# Patient Record
Sex: Female | Born: 1955 | Race: Black or African American | Hispanic: No | Marital: Married | Smoking: Former smoker
Health system: Southern US, Community
[De-identification: ages and names within clinical notes are randomized; demographics above are authoritative.]

## PROBLEM LIST (undated history)

## (undated) DIAGNOSIS — J45909 Unspecified asthma, uncomplicated: Secondary | ICD-10-CM

## (undated) DIAGNOSIS — G8929 Other chronic pain: Secondary | ICD-10-CM

## (undated) DIAGNOSIS — Z8489 Family history of other specified conditions: Secondary | ICD-10-CM

## (undated) DIAGNOSIS — D561 Beta thalassemia: Secondary | ICD-10-CM

## (undated) DIAGNOSIS — R569 Unspecified convulsions: Secondary | ICD-10-CM

## (undated) DIAGNOSIS — Z9289 Personal history of other medical treatment: Secondary | ICD-10-CM

## (undated) DIAGNOSIS — Q453 Other congenital malformations of pancreas and pancreatic duct: Secondary | ICD-10-CM

## (undated) DIAGNOSIS — D759 Disease of blood and blood-forming organs, unspecified: Secondary | ICD-10-CM

## (undated) DIAGNOSIS — G43909 Migraine, unspecified, not intractable, without status migrainosus: Secondary | ICD-10-CM

## (undated) DIAGNOSIS — J189 Pneumonia, unspecified organism: Secondary | ICD-10-CM

## (undated) DIAGNOSIS — R109 Unspecified abdominal pain: Secondary | ICD-10-CM

## (undated) DIAGNOSIS — I10 Essential (primary) hypertension: Secondary | ICD-10-CM

## (undated) DIAGNOSIS — D569 Thalassemia, unspecified: Secondary | ICD-10-CM

## (undated) DIAGNOSIS — E119 Type 2 diabetes mellitus without complications: Secondary | ICD-10-CM

## (undated) DIAGNOSIS — I2699 Other pulmonary embolism without acute cor pulmonale: Secondary | ICD-10-CM

## (undated) DIAGNOSIS — D573 Sickle-cell trait: Secondary | ICD-10-CM

## (undated) DIAGNOSIS — I639 Cerebral infarction, unspecified: Secondary | ICD-10-CM

## (undated) DIAGNOSIS — E785 Hyperlipidemia, unspecified: Secondary | ICD-10-CM

## (undated) DIAGNOSIS — C55 Malignant neoplasm of uterus, part unspecified: Secondary | ICD-10-CM

## (undated) DIAGNOSIS — R002 Palpitations: Secondary | ICD-10-CM

## (undated) HISTORY — DX: Beta thalassemia: D56.1

## (undated) HISTORY — PX: BREAST LUMPECTOMY: SHX2

## (undated) HISTORY — DX: Thalassemia, unspecified: D56.9

## (undated) HISTORY — DX: Unspecified abdominal pain: R10.9

## (undated) HISTORY — DX: Hyperlipidemia, unspecified: E78.5

## (undated) HISTORY — DX: Palpitations: R00.2

## (undated) HISTORY — DX: Other congenital malformations of pancreas and pancreatic duct: Q45.3

## (undated) HISTORY — DX: Migraine, unspecified, not intractable, without status migrainosus: G43.909

## (undated) HISTORY — DX: Essential (primary) hypertension: I10

## (undated) HISTORY — DX: Sickle-cell trait: D57.3

## (undated) HISTORY — DX: Other chronic pain: G89.29

## (undated) HISTORY — DX: Other pulmonary embolism without acute cor pulmonale: I26.99

## (undated) HISTORY — PX: APPENDECTOMY: SHX54

## (undated) HISTORY — PX: SPHINCTEROTOMY: SHX5279

## (undated) HISTORY — PX: OTHER SURGICAL HISTORY: SHX169

---

## 1978-05-02 HISTORY — PX: ABDOMINAL HYSTERECTOMY: SHX81

## 1991-05-03 DIAGNOSIS — I639 Cerebral infarction, unspecified: Secondary | ICD-10-CM

## 1991-05-03 DIAGNOSIS — R569 Unspecified convulsions: Secondary | ICD-10-CM

## 1991-05-03 HISTORY — DX: Cerebral infarction, unspecified: I63.9

## 1991-05-03 HISTORY — DX: Unspecified convulsions: R56.9

## 1992-02-07 DIAGNOSIS — E119 Type 2 diabetes mellitus without complications: Secondary | ICD-10-CM

## 1992-02-07 DIAGNOSIS — E1165 Type 2 diabetes mellitus with hyperglycemia: Secondary | ICD-10-CM | POA: Insufficient documentation

## 1997-07-21 ENCOUNTER — Ambulatory Visit (HOSPITAL_COMMUNITY): Admission: RE | Admit: 1997-07-21 | Discharge: 1997-07-21 | Payer: Self-pay | Admitting: *Deleted

## 1997-07-31 ENCOUNTER — Encounter: Admission: RE | Admit: 1997-07-31 | Discharge: 1997-10-29 | Payer: Self-pay | Admitting: Orthopedic Surgery

## 1997-08-30 ENCOUNTER — Emergency Department (HOSPITAL_COMMUNITY): Admission: EM | Admit: 1997-08-30 | Discharge: 1997-08-30 | Payer: Self-pay | Admitting: Emergency Medicine

## 1997-09-06 ENCOUNTER — Emergency Department (HOSPITAL_COMMUNITY): Admission: EM | Admit: 1997-09-06 | Discharge: 1997-09-06 | Payer: Self-pay | Admitting: Emergency Medicine

## 1997-09-19 ENCOUNTER — Encounter: Admission: RE | Admit: 1997-09-19 | Discharge: 1997-09-19 | Payer: Self-pay | Admitting: Internal Medicine

## 1997-11-25 ENCOUNTER — Inpatient Hospital Stay (HOSPITAL_COMMUNITY): Admission: EM | Admit: 1997-11-25 | Discharge: 1997-11-27 | Payer: Self-pay | Admitting: Emergency Medicine

## 1997-12-12 ENCOUNTER — Encounter: Admission: RE | Admit: 1997-12-12 | Discharge: 1997-12-12 | Payer: Self-pay | Admitting: Internal Medicine

## 1998-02-24 ENCOUNTER — Emergency Department (HOSPITAL_COMMUNITY): Admission: EM | Admit: 1998-02-24 | Discharge: 1998-02-24 | Payer: Self-pay | Admitting: Emergency Medicine

## 1998-05-02 ENCOUNTER — Emergency Department (HOSPITAL_COMMUNITY): Admission: EM | Admit: 1998-05-02 | Discharge: 1998-05-02 | Payer: Self-pay | Admitting: Emergency Medicine

## 1998-05-31 ENCOUNTER — Encounter: Payer: Self-pay | Admitting: Emergency Medicine

## 1998-05-31 ENCOUNTER — Emergency Department (HOSPITAL_COMMUNITY): Admission: EM | Admit: 1998-05-31 | Discharge: 1998-05-31 | Payer: Self-pay | Admitting: Emergency Medicine

## 1998-06-16 ENCOUNTER — Encounter: Admission: RE | Admit: 1998-06-16 | Discharge: 1998-06-16 | Payer: Self-pay | Admitting: Obstetrics & Gynecology

## 1998-07-28 ENCOUNTER — Emergency Department (HOSPITAL_COMMUNITY): Admission: EM | Admit: 1998-07-28 | Discharge: 1998-07-28 | Payer: Self-pay | Admitting: Emergency Medicine

## 1998-08-04 ENCOUNTER — Encounter: Admission: RE | Admit: 1998-08-04 | Discharge: 1998-08-04 | Payer: Self-pay | Admitting: Obstetrics & Gynecology

## 1998-08-11 ENCOUNTER — Emergency Department (HOSPITAL_COMMUNITY): Admission: EM | Admit: 1998-08-11 | Discharge: 1998-08-11 | Payer: Self-pay | Admitting: Emergency Medicine

## 1998-08-13 ENCOUNTER — Encounter: Admission: RE | Admit: 1998-08-13 | Discharge: 1998-08-13 | Payer: Self-pay | Admitting: Hematology and Oncology

## 1998-08-20 ENCOUNTER — Encounter: Admission: RE | Admit: 1998-08-20 | Discharge: 1998-08-20 | Payer: Self-pay | Admitting: Hematology and Oncology

## 1998-10-12 ENCOUNTER — Emergency Department (HOSPITAL_COMMUNITY): Admission: EM | Admit: 1998-10-12 | Discharge: 1998-10-12 | Payer: Self-pay | Admitting: Emergency Medicine

## 1998-10-12 ENCOUNTER — Encounter: Payer: Self-pay | Admitting: Emergency Medicine

## 1998-12-30 ENCOUNTER — Encounter: Admission: RE | Admit: 1998-12-30 | Discharge: 1998-12-30 | Payer: Self-pay | Admitting: Internal Medicine

## 1999-01-08 ENCOUNTER — Encounter: Admission: RE | Admit: 1999-01-08 | Discharge: 1999-01-08 | Payer: Self-pay | Admitting: Internal Medicine

## 1999-01-12 ENCOUNTER — Encounter: Admission: RE | Admit: 1999-01-12 | Discharge: 1999-01-12 | Payer: Self-pay | Admitting: Obstetrics & Gynecology

## 1999-03-29 ENCOUNTER — Emergency Department (HOSPITAL_COMMUNITY): Admission: EM | Admit: 1999-03-29 | Discharge: 1999-03-29 | Payer: Self-pay | Admitting: Emergency Medicine

## 1999-03-29 ENCOUNTER — Encounter: Payer: Self-pay | Admitting: Emergency Medicine

## 1999-06-17 ENCOUNTER — Encounter: Admission: RE | Admit: 1999-06-17 | Discharge: 1999-06-17 | Payer: Self-pay | Admitting: Internal Medicine

## 1999-07-27 ENCOUNTER — Emergency Department (HOSPITAL_COMMUNITY): Admission: EM | Admit: 1999-07-27 | Discharge: 1999-07-27 | Payer: Self-pay | Admitting: Emergency Medicine

## 1999-08-22 ENCOUNTER — Encounter: Payer: Self-pay | Admitting: Emergency Medicine

## 1999-08-22 ENCOUNTER — Emergency Department (HOSPITAL_COMMUNITY): Admission: EM | Admit: 1999-08-22 | Discharge: 1999-08-22 | Payer: Self-pay | Admitting: Emergency Medicine

## 1999-09-17 ENCOUNTER — Encounter: Admission: RE | Admit: 1999-09-17 | Discharge: 1999-09-17 | Payer: Self-pay | Admitting: Hematology and Oncology

## 1999-10-01 ENCOUNTER — Emergency Department (HOSPITAL_COMMUNITY): Admission: EM | Admit: 1999-10-01 | Discharge: 1999-10-01 | Payer: Self-pay | Admitting: Emergency Medicine

## 1999-10-09 ENCOUNTER — Emergency Department (HOSPITAL_COMMUNITY): Admission: EM | Admit: 1999-10-09 | Discharge: 1999-10-09 | Payer: Self-pay | Admitting: Emergency Medicine

## 1999-10-09 ENCOUNTER — Encounter: Payer: Self-pay | Admitting: Emergency Medicine

## 1999-11-16 ENCOUNTER — Emergency Department (HOSPITAL_COMMUNITY): Admission: EM | Admit: 1999-11-16 | Discharge: 1999-11-16 | Payer: Self-pay | Admitting: Emergency Medicine

## 2000-01-11 ENCOUNTER — Emergency Department (HOSPITAL_COMMUNITY): Admission: EM | Admit: 2000-01-11 | Discharge: 2000-01-11 | Payer: Self-pay | Admitting: Emergency Medicine

## 2000-01-17 ENCOUNTER — Encounter: Admission: RE | Admit: 2000-01-17 | Discharge: 2000-01-17 | Payer: Self-pay | Admitting: Internal Medicine

## 2000-01-21 ENCOUNTER — Emergency Department (HOSPITAL_COMMUNITY): Admission: EM | Admit: 2000-01-21 | Discharge: 2000-01-21 | Payer: Self-pay | Admitting: Emergency Medicine

## 2000-03-18 ENCOUNTER — Emergency Department (HOSPITAL_COMMUNITY): Admission: EM | Admit: 2000-03-18 | Discharge: 2000-03-18 | Payer: Self-pay | Admitting: Emergency Medicine

## 2000-04-10 ENCOUNTER — Emergency Department (HOSPITAL_COMMUNITY): Admission: EM | Admit: 2000-04-10 | Discharge: 2000-04-10 | Payer: Self-pay | Admitting: Emergency Medicine

## 2000-04-29 ENCOUNTER — Emergency Department (HOSPITAL_COMMUNITY): Admission: EM | Admit: 2000-04-29 | Discharge: 2000-04-29 | Payer: Self-pay | Admitting: Emergency Medicine

## 2000-05-17 ENCOUNTER — Emergency Department (HOSPITAL_COMMUNITY): Admission: EM | Admit: 2000-05-17 | Discharge: 2000-05-17 | Payer: Self-pay | Admitting: *Deleted

## 2000-06-06 ENCOUNTER — Emergency Department (HOSPITAL_COMMUNITY): Admission: EM | Admit: 2000-06-06 | Discharge: 2000-06-06 | Payer: Self-pay | Admitting: Emergency Medicine

## 2000-06-21 ENCOUNTER — Encounter: Payer: Self-pay | Admitting: Emergency Medicine

## 2000-06-22 ENCOUNTER — Inpatient Hospital Stay (HOSPITAL_COMMUNITY): Admission: EM | Admit: 2000-06-22 | Discharge: 2000-06-22 | Payer: Self-pay | Admitting: Emergency Medicine

## 2000-06-28 ENCOUNTER — Encounter: Admission: RE | Admit: 2000-06-28 | Discharge: 2000-06-28 | Payer: Self-pay | Admitting: Internal Medicine

## 2000-08-23 ENCOUNTER — Encounter: Payer: Self-pay | Admitting: Emergency Medicine

## 2000-08-23 ENCOUNTER — Emergency Department (HOSPITAL_COMMUNITY): Admission: EM | Admit: 2000-08-23 | Discharge: 2000-08-23 | Payer: Self-pay | Admitting: Emergency Medicine

## 2000-08-24 ENCOUNTER — Encounter: Payer: Self-pay | Admitting: Internal Medicine

## 2000-08-24 ENCOUNTER — Encounter: Admission: RE | Admit: 2000-08-24 | Discharge: 2000-08-24 | Payer: Self-pay | Admitting: Internal Medicine

## 2000-08-24 ENCOUNTER — Inpatient Hospital Stay (HOSPITAL_COMMUNITY): Admission: AD | Admit: 2000-08-24 | Discharge: 2000-08-26 | Payer: Self-pay | Admitting: Internal Medicine

## 2000-08-25 ENCOUNTER — Encounter: Payer: Self-pay | Admitting: Internal Medicine

## 2000-09-01 ENCOUNTER — Encounter: Admission: RE | Admit: 2000-09-01 | Discharge: 2000-09-01 | Payer: Self-pay | Admitting: Internal Medicine

## 2000-10-18 ENCOUNTER — Emergency Department (HOSPITAL_COMMUNITY): Admission: EM | Admit: 2000-10-18 | Discharge: 2000-10-18 | Payer: Self-pay | Admitting: Emergency Medicine

## 2000-11-07 ENCOUNTER — Encounter: Payer: Self-pay | Admitting: Emergency Medicine

## 2000-11-07 ENCOUNTER — Emergency Department (HOSPITAL_COMMUNITY): Admission: EM | Admit: 2000-11-07 | Discharge: 2000-11-07 | Payer: Self-pay | Admitting: Emergency Medicine

## 2000-11-20 ENCOUNTER — Emergency Department (HOSPITAL_COMMUNITY): Admission: EM | Admit: 2000-11-20 | Discharge: 2000-11-20 | Payer: Self-pay | Admitting: Emergency Medicine

## 2001-01-09 ENCOUNTER — Emergency Department (HOSPITAL_COMMUNITY): Admission: EM | Admit: 2001-01-09 | Discharge: 2001-01-09 | Payer: Self-pay | Admitting: Emergency Medicine

## 2001-01-23 ENCOUNTER — Emergency Department (HOSPITAL_COMMUNITY): Admission: EM | Admit: 2001-01-23 | Discharge: 2001-01-23 | Payer: Self-pay | Admitting: Emergency Medicine

## 2001-01-31 ENCOUNTER — Encounter: Admission: RE | Admit: 2001-01-31 | Discharge: 2001-01-31 | Payer: Self-pay | Admitting: Internal Medicine

## 2001-02-28 ENCOUNTER — Emergency Department (HOSPITAL_COMMUNITY): Admission: EM | Admit: 2001-02-28 | Discharge: 2001-02-28 | Payer: Self-pay | Admitting: Emergency Medicine

## 2001-03-04 ENCOUNTER — Emergency Department (HOSPITAL_COMMUNITY): Admission: EM | Admit: 2001-03-04 | Discharge: 2001-03-04 | Payer: Self-pay

## 2001-04-02 ENCOUNTER — Inpatient Hospital Stay (HOSPITAL_COMMUNITY): Admission: EM | Admit: 2001-04-02 | Discharge: 2001-04-05 | Payer: Self-pay | Admitting: Emergency Medicine

## 2001-04-02 ENCOUNTER — Encounter: Payer: Self-pay | Admitting: Emergency Medicine

## 2001-05-10 ENCOUNTER — Emergency Department (HOSPITAL_COMMUNITY): Admission: EM | Admit: 2001-05-10 | Discharge: 2001-05-11 | Payer: Self-pay | Admitting: Emergency Medicine

## 2001-05-15 ENCOUNTER — Encounter: Admission: RE | Admit: 2001-05-15 | Discharge: 2001-05-15 | Payer: Self-pay | Admitting: Internal Medicine

## 2001-06-12 ENCOUNTER — Encounter: Admission: RE | Admit: 2001-06-12 | Discharge: 2001-06-12 | Payer: Self-pay | Admitting: Internal Medicine

## 2001-06-26 ENCOUNTER — Encounter: Admission: RE | Admit: 2001-06-26 | Discharge: 2001-06-26 | Payer: Self-pay | Admitting: Internal Medicine

## 2001-07-09 ENCOUNTER — Encounter: Admission: RE | Admit: 2001-07-09 | Discharge: 2001-07-09 | Payer: Self-pay

## 2001-07-24 ENCOUNTER — Emergency Department (HOSPITAL_COMMUNITY): Admission: EM | Admit: 2001-07-24 | Discharge: 2001-07-24 | Payer: Self-pay | Admitting: Emergency Medicine

## 2001-08-24 ENCOUNTER — Encounter: Admission: RE | Admit: 2001-08-24 | Discharge: 2001-08-24 | Payer: Self-pay | Admitting: Internal Medicine

## 2001-10-17 ENCOUNTER — Encounter: Payer: Self-pay | Admitting: Emergency Medicine

## 2001-10-17 ENCOUNTER — Emergency Department (HOSPITAL_COMMUNITY): Admission: EM | Admit: 2001-10-17 | Discharge: 2001-10-17 | Payer: Self-pay | Admitting: Emergency Medicine

## 2001-11-15 ENCOUNTER — Encounter: Admission: RE | Admit: 2001-11-15 | Discharge: 2001-11-15 | Payer: Self-pay | Admitting: Internal Medicine

## 2001-11-30 ENCOUNTER — Emergency Department (HOSPITAL_COMMUNITY): Admission: EM | Admit: 2001-11-30 | Discharge: 2001-11-30 | Payer: Self-pay | Admitting: Emergency Medicine

## 2001-12-16 ENCOUNTER — Emergency Department (HOSPITAL_COMMUNITY): Admission: EM | Admit: 2001-12-16 | Discharge: 2001-12-16 | Payer: Self-pay | Admitting: Emergency Medicine

## 2001-12-16 ENCOUNTER — Encounter: Payer: Self-pay | Admitting: Emergency Medicine

## 2001-12-20 ENCOUNTER — Emergency Department (HOSPITAL_COMMUNITY): Admission: EM | Admit: 2001-12-20 | Discharge: 2001-12-20 | Payer: Self-pay | Admitting: Emergency Medicine

## 2002-01-06 ENCOUNTER — Observation Stay (HOSPITAL_COMMUNITY): Admission: EM | Admit: 2002-01-06 | Discharge: 2002-01-07 | Payer: Self-pay | Admitting: Emergency Medicine

## 2002-01-06 ENCOUNTER — Encounter: Payer: Self-pay | Admitting: Emergency Medicine

## 2002-01-09 ENCOUNTER — Encounter: Payer: Self-pay | Admitting: Internal Medicine

## 2002-01-09 ENCOUNTER — Encounter: Admission: RE | Admit: 2002-01-09 | Discharge: 2002-01-09 | Payer: Self-pay | Admitting: Internal Medicine

## 2002-01-21 ENCOUNTER — Encounter: Admission: RE | Admit: 2002-01-21 | Discharge: 2002-01-21 | Payer: Self-pay | Admitting: Internal Medicine

## 2002-02-13 ENCOUNTER — Emergency Department (HOSPITAL_COMMUNITY): Admission: EM | Admit: 2002-02-13 | Discharge: 2002-02-13 | Payer: Self-pay | Admitting: Emergency Medicine

## 2002-03-30 ENCOUNTER — Emergency Department (HOSPITAL_COMMUNITY): Admission: EM | Admit: 2002-03-30 | Discharge: 2002-03-30 | Payer: Self-pay | Admitting: Emergency Medicine

## 2002-04-02 ENCOUNTER — Encounter: Admission: RE | Admit: 2002-04-02 | Discharge: 2002-04-02 | Payer: Self-pay | Admitting: Internal Medicine

## 2002-04-26 ENCOUNTER — Encounter: Admission: RE | Admit: 2002-04-26 | Discharge: 2002-06-24 | Payer: Self-pay | Admitting: Orthopaedic Surgery

## 2002-05-06 ENCOUNTER — Emergency Department (HOSPITAL_COMMUNITY): Admission: EM | Admit: 2002-05-06 | Discharge: 2002-05-07 | Payer: Self-pay | Admitting: Emergency Medicine

## 2002-05-06 ENCOUNTER — Encounter: Payer: Self-pay | Admitting: Emergency Medicine

## 2002-05-20 ENCOUNTER — Encounter: Admission: RE | Admit: 2002-05-20 | Discharge: 2002-05-20 | Payer: Self-pay | Admitting: Internal Medicine

## 2002-06-10 ENCOUNTER — Emergency Department (HOSPITAL_COMMUNITY): Admission: EM | Admit: 2002-06-10 | Discharge: 2002-06-10 | Payer: Self-pay | Admitting: Emergency Medicine

## 2002-08-04 ENCOUNTER — Emergency Department (HOSPITAL_COMMUNITY): Admission: EM | Admit: 2002-08-04 | Discharge: 2002-08-04 | Payer: Self-pay | Admitting: Emergency Medicine

## 2002-08-22 ENCOUNTER — Emergency Department (HOSPITAL_COMMUNITY): Admission: EM | Admit: 2002-08-22 | Discharge: 2002-08-22 | Payer: Self-pay | Admitting: Emergency Medicine

## 2002-08-29 ENCOUNTER — Encounter: Admission: RE | Admit: 2002-08-29 | Discharge: 2002-08-29 | Payer: Self-pay | Admitting: Internal Medicine

## 2002-09-09 ENCOUNTER — Encounter: Admission: RE | Admit: 2002-09-09 | Discharge: 2002-09-09 | Payer: Self-pay | Admitting: Internal Medicine

## 2002-09-18 ENCOUNTER — Encounter: Admission: RE | Admit: 2002-09-18 | Discharge: 2002-09-18 | Payer: Self-pay | Admitting: Internal Medicine

## 2002-09-27 ENCOUNTER — Encounter: Admission: RE | Admit: 2002-09-27 | Discharge: 2002-09-27 | Payer: Self-pay | Admitting: Internal Medicine

## 2002-11-21 ENCOUNTER — Encounter (INDEPENDENT_AMBULATORY_CARE_PROVIDER_SITE_OTHER): Payer: Self-pay | Admitting: Pulmonary Disease

## 2002-11-21 ENCOUNTER — Encounter: Admission: RE | Admit: 2002-11-21 | Discharge: 2002-11-21 | Payer: Self-pay | Admitting: Internal Medicine

## 2002-11-26 ENCOUNTER — Encounter (INDEPENDENT_AMBULATORY_CARE_PROVIDER_SITE_OTHER): Payer: Self-pay | Admitting: Pulmonary Disease

## 2002-12-06 ENCOUNTER — Encounter: Admission: RE | Admit: 2002-12-06 | Discharge: 2002-12-06 | Payer: Self-pay | Admitting: Internal Medicine

## 2002-12-09 ENCOUNTER — Encounter: Payer: Self-pay | Admitting: Internal Medicine

## 2002-12-09 ENCOUNTER — Ambulatory Visit (HOSPITAL_COMMUNITY): Admission: RE | Admit: 2002-12-09 | Discharge: 2002-12-09 | Payer: Self-pay | Admitting: Internal Medicine

## 2002-12-13 ENCOUNTER — Encounter: Admission: RE | Admit: 2002-12-13 | Discharge: 2002-12-13 | Payer: Self-pay | Admitting: Internal Medicine

## 2002-12-17 ENCOUNTER — Encounter: Admission: RE | Admit: 2002-12-17 | Discharge: 2002-12-17 | Payer: Self-pay | Admitting: Obstetrics and Gynecology

## 2002-12-19 ENCOUNTER — Encounter: Payer: Self-pay | Admitting: Obstetrics and Gynecology

## 2002-12-19 ENCOUNTER — Ambulatory Visit (HOSPITAL_COMMUNITY): Admission: RE | Admit: 2002-12-19 | Discharge: 2002-12-19 | Payer: Self-pay | Admitting: Obstetrics and Gynecology

## 2002-12-26 ENCOUNTER — Ambulatory Visit (HOSPITAL_COMMUNITY): Admission: RE | Admit: 2002-12-26 | Discharge: 2002-12-26 | Payer: Self-pay | Admitting: Obstetrics and Gynecology

## 2002-12-26 ENCOUNTER — Encounter: Payer: Self-pay | Admitting: Obstetrics and Gynecology

## 2002-12-27 ENCOUNTER — Emergency Department (HOSPITAL_COMMUNITY): Admission: EM | Admit: 2002-12-27 | Discharge: 2002-12-27 | Payer: Self-pay | Admitting: Emergency Medicine

## 2002-12-27 ENCOUNTER — Encounter: Payer: Self-pay | Admitting: *Deleted

## 2003-01-07 ENCOUNTER — Encounter: Admission: RE | Admit: 2003-01-07 | Discharge: 2003-01-07 | Payer: Self-pay | Admitting: Internal Medicine

## 2003-02-18 ENCOUNTER — Encounter: Payer: Self-pay | Admitting: Gastroenterology

## 2003-02-18 ENCOUNTER — Ambulatory Visit (HOSPITAL_COMMUNITY): Admission: RE | Admit: 2003-02-18 | Discharge: 2003-02-18 | Payer: Self-pay | Admitting: Gastroenterology

## 2003-02-20 ENCOUNTER — Emergency Department (HOSPITAL_COMMUNITY): Admission: EM | Admit: 2003-02-20 | Discharge: 2003-02-20 | Payer: Self-pay | Admitting: Emergency Medicine

## 2003-02-27 ENCOUNTER — Encounter: Admission: RE | Admit: 2003-02-27 | Discharge: 2003-02-27 | Payer: Self-pay | Admitting: Internal Medicine

## 2003-05-05 ENCOUNTER — Emergency Department (HOSPITAL_COMMUNITY): Admission: EM | Admit: 2003-05-05 | Discharge: 2003-05-05 | Payer: Self-pay | Admitting: Emergency Medicine

## 2003-05-18 ENCOUNTER — Emergency Department (HOSPITAL_COMMUNITY): Admission: EM | Admit: 2003-05-18 | Discharge: 2003-05-18 | Payer: Self-pay | Admitting: Emergency Medicine

## 2003-05-28 ENCOUNTER — Encounter: Admission: RE | Admit: 2003-05-28 | Discharge: 2003-05-28 | Payer: Self-pay | Admitting: Internal Medicine

## 2003-07-17 ENCOUNTER — Emergency Department (HOSPITAL_COMMUNITY): Admission: EM | Admit: 2003-07-17 | Discharge: 2003-07-17 | Payer: Self-pay | Admitting: Emergency Medicine

## 2003-08-24 ENCOUNTER — Emergency Department (HOSPITAL_COMMUNITY): Admission: EM | Admit: 2003-08-24 | Discharge: 2003-08-24 | Payer: Self-pay | Admitting: Family Medicine

## 2003-09-02 ENCOUNTER — Encounter: Admission: RE | Admit: 2003-09-02 | Discharge: 2003-09-02 | Payer: Self-pay | Admitting: Internal Medicine

## 2003-09-02 ENCOUNTER — Ambulatory Visit (HOSPITAL_COMMUNITY): Admission: RE | Admit: 2003-09-02 | Discharge: 2003-09-02 | Payer: Self-pay | Admitting: Internal Medicine

## 2004-01-24 ENCOUNTER — Emergency Department (HOSPITAL_COMMUNITY): Admission: EM | Admit: 2004-01-24 | Discharge: 2004-01-24 | Payer: Self-pay | Admitting: Emergency Medicine

## 2004-01-29 ENCOUNTER — Ambulatory Visit: Payer: Self-pay | Admitting: Internal Medicine

## 2004-02-16 ENCOUNTER — Inpatient Hospital Stay (HOSPITAL_COMMUNITY): Admission: EM | Admit: 2004-02-16 | Discharge: 2004-02-19 | Payer: Self-pay | Admitting: Emergency Medicine

## 2004-02-16 ENCOUNTER — Ambulatory Visit: Payer: Self-pay | Admitting: Infectious Diseases

## 2004-02-24 ENCOUNTER — Inpatient Hospital Stay (HOSPITAL_COMMUNITY): Admission: EM | Admit: 2004-02-24 | Discharge: 2004-03-01 | Payer: Self-pay | Admitting: Emergency Medicine

## 2004-02-27 LAB — HM COLONOSCOPY: HM Colonoscopy: NORMAL

## 2004-03-14 ENCOUNTER — Emergency Department (HOSPITAL_COMMUNITY): Admission: EM | Admit: 2004-03-14 | Discharge: 2004-03-14 | Payer: Self-pay | Admitting: Family Medicine

## 2004-04-08 ENCOUNTER — Ambulatory Visit: Payer: Self-pay | Admitting: Internal Medicine

## 2004-05-16 ENCOUNTER — Emergency Department (HOSPITAL_COMMUNITY): Admission: EM | Admit: 2004-05-16 | Discharge: 2004-05-16 | Payer: Self-pay | Admitting: Family Medicine

## 2004-05-24 ENCOUNTER — Emergency Department (HOSPITAL_COMMUNITY): Admission: EM | Admit: 2004-05-24 | Discharge: 2004-05-24 | Payer: Self-pay | Admitting: Family Medicine

## 2004-06-10 ENCOUNTER — Ambulatory Visit: Payer: Self-pay | Admitting: Internal Medicine

## 2004-07-06 ENCOUNTER — Emergency Department (HOSPITAL_COMMUNITY): Admission: EM | Admit: 2004-07-06 | Discharge: 2004-07-06 | Payer: Self-pay | Admitting: Emergency Medicine

## 2004-07-23 ENCOUNTER — Ambulatory Visit: Payer: Self-pay | Admitting: Internal Medicine

## 2004-07-26 ENCOUNTER — Ambulatory Visit (HOSPITAL_COMMUNITY): Admission: RE | Admit: 2004-07-26 | Discharge: 2004-07-26 | Payer: Self-pay | Admitting: Internal Medicine

## 2004-08-12 ENCOUNTER — Ambulatory Visit: Payer: Self-pay | Admitting: Internal Medicine

## 2004-08-30 DIAGNOSIS — I2699 Other pulmonary embolism without acute cor pulmonale: Secondary | ICD-10-CM

## 2004-08-30 HISTORY — DX: Other pulmonary embolism without acute cor pulmonale: I26.99

## 2004-09-11 ENCOUNTER — Emergency Department (HOSPITAL_COMMUNITY): Admission: EM | Admit: 2004-09-11 | Discharge: 2004-09-11 | Payer: Self-pay | Admitting: Emergency Medicine

## 2004-09-14 ENCOUNTER — Ambulatory Visit: Payer: Self-pay | Admitting: Internal Medicine

## 2004-09-19 ENCOUNTER — Emergency Department (HOSPITAL_COMMUNITY): Admission: EM | Admit: 2004-09-19 | Discharge: 2004-09-19 | Payer: Self-pay | Admitting: Family Medicine

## 2004-09-20 ENCOUNTER — Ambulatory Visit (HOSPITAL_COMMUNITY): Admission: RE | Admit: 2004-09-20 | Discharge: 2004-09-20 | Payer: Self-pay | Admitting: Family Medicine

## 2004-09-21 ENCOUNTER — Inpatient Hospital Stay (HOSPITAL_COMMUNITY): Admission: EM | Admit: 2004-09-21 | Discharge: 2004-09-24 | Payer: Self-pay | Admitting: Family Medicine

## 2004-09-21 ENCOUNTER — Ambulatory Visit: Payer: Self-pay | Admitting: Infectious Diseases

## 2004-09-21 ENCOUNTER — Ambulatory Visit: Payer: Self-pay | Admitting: Dentistry

## 2004-09-28 ENCOUNTER — Ambulatory Visit: Payer: Self-pay | Admitting: Internal Medicine

## 2004-10-01 ENCOUNTER — Ambulatory Visit: Payer: Self-pay | Admitting: Internal Medicine

## 2004-10-11 ENCOUNTER — Ambulatory Visit: Payer: Self-pay | Admitting: Internal Medicine

## 2004-10-15 ENCOUNTER — Observation Stay (HOSPITAL_COMMUNITY): Admission: EM | Admit: 2004-10-15 | Discharge: 2004-10-16 | Payer: Self-pay | Admitting: Emergency Medicine

## 2004-10-18 ENCOUNTER — Ambulatory Visit: Payer: Self-pay | Admitting: Internal Medicine

## 2004-10-30 ENCOUNTER — Emergency Department (HOSPITAL_COMMUNITY): Admission: EM | Admit: 2004-10-30 | Discharge: 2004-10-30 | Payer: Self-pay | Admitting: Emergency Medicine

## 2004-11-01 ENCOUNTER — Ambulatory Visit: Payer: Self-pay | Admitting: Internal Medicine

## 2004-11-03 ENCOUNTER — Ambulatory Visit: Payer: Self-pay | Admitting: Internal Medicine

## 2004-11-22 ENCOUNTER — Ambulatory Visit (HOSPITAL_COMMUNITY): Admission: RE | Admit: 2004-11-22 | Discharge: 2004-11-22 | Payer: Self-pay | Admitting: Internal Medicine

## 2004-11-22 ENCOUNTER — Ambulatory Visit: Payer: Self-pay | Admitting: Internal Medicine

## 2004-11-25 ENCOUNTER — Ambulatory Visit: Payer: Self-pay | Admitting: Internal Medicine

## 2004-12-06 ENCOUNTER — Ambulatory Visit: Payer: Self-pay | Admitting: Internal Medicine

## 2004-12-25 ENCOUNTER — Emergency Department (HOSPITAL_COMMUNITY): Admission: EM | Admit: 2004-12-25 | Discharge: 2004-12-25 | Payer: Self-pay | Admitting: Family Medicine

## 2004-12-27 ENCOUNTER — Ambulatory Visit: Payer: Self-pay | Admitting: Internal Medicine

## 2005-01-18 ENCOUNTER — Emergency Department (HOSPITAL_COMMUNITY): Admission: EM | Admit: 2005-01-18 | Discharge: 2005-01-18 | Payer: Self-pay | Admitting: Family Medicine

## 2005-01-20 ENCOUNTER — Ambulatory Visit: Payer: Self-pay | Admitting: Internal Medicine

## 2005-01-23 ENCOUNTER — Emergency Department (HOSPITAL_COMMUNITY): Admission: EM | Admit: 2005-01-23 | Discharge: 2005-01-23 | Payer: Self-pay | Admitting: Family Medicine

## 2005-02-01 ENCOUNTER — Encounter (HOSPITAL_BASED_OUTPATIENT_CLINIC_OR_DEPARTMENT_OTHER): Admission: RE | Admit: 2005-02-01 | Discharge: 2005-03-04 | Payer: Self-pay | Admitting: Surgery

## 2005-02-07 ENCOUNTER — Ambulatory Visit: Payer: Self-pay | Admitting: Hospitalist

## 2005-02-21 ENCOUNTER — Ambulatory Visit: Payer: Self-pay | Admitting: Internal Medicine

## 2005-02-22 ENCOUNTER — Ambulatory Visit (HOSPITAL_COMMUNITY): Admission: RE | Admit: 2005-02-22 | Discharge: 2005-02-22 | Payer: Self-pay | Admitting: Hospitalist

## 2005-02-22 ENCOUNTER — Ambulatory Visit: Payer: Self-pay | Admitting: Hospitalist

## 2005-03-16 ENCOUNTER — Ambulatory Visit: Payer: Self-pay | Admitting: Internal Medicine

## 2005-03-21 ENCOUNTER — Ambulatory Visit: Payer: Self-pay | Admitting: Internal Medicine

## 2005-04-11 ENCOUNTER — Ambulatory Visit: Payer: Self-pay | Admitting: Internal Medicine

## 2005-04-14 ENCOUNTER — Emergency Department (HOSPITAL_COMMUNITY): Admission: EM | Admit: 2005-04-14 | Discharge: 2005-04-15 | Payer: Self-pay | Admitting: Emergency Medicine

## 2005-04-26 ENCOUNTER — Emergency Department (HOSPITAL_COMMUNITY): Admission: EM | Admit: 2005-04-26 | Discharge: 2005-04-26 | Payer: Self-pay | Admitting: Family Medicine

## 2005-04-28 ENCOUNTER — Ambulatory Visit: Payer: Self-pay | Admitting: Internal Medicine

## 2005-05-05 ENCOUNTER — Ambulatory Visit: Payer: Self-pay | Admitting: Internal Medicine

## 2005-05-30 ENCOUNTER — Ambulatory Visit: Payer: Self-pay | Admitting: Internal Medicine

## 2005-06-06 ENCOUNTER — Ambulatory Visit: Payer: Self-pay | Admitting: Internal Medicine

## 2005-06-10 ENCOUNTER — Ambulatory Visit: Payer: Self-pay | Admitting: Internal Medicine

## 2005-06-14 ENCOUNTER — Emergency Department (HOSPITAL_COMMUNITY): Admission: EM | Admit: 2005-06-14 | Discharge: 2005-06-14 | Payer: Self-pay | Admitting: Emergency Medicine

## 2005-07-04 ENCOUNTER — Ambulatory Visit: Payer: Self-pay | Admitting: Hospitalist

## 2005-07-14 ENCOUNTER — Ambulatory Visit (HOSPITAL_COMMUNITY): Admission: RE | Admit: 2005-07-14 | Discharge: 2005-07-14 | Payer: Self-pay | Admitting: Cardiology

## 2005-07-14 ENCOUNTER — Encounter (INDEPENDENT_AMBULATORY_CARE_PROVIDER_SITE_OTHER): Payer: Self-pay | Admitting: *Deleted

## 2005-07-20 ENCOUNTER — Ambulatory Visit: Payer: Self-pay | Admitting: Internal Medicine

## 2005-07-27 ENCOUNTER — Ambulatory Visit: Payer: Self-pay | Admitting: Internal Medicine

## 2005-07-28 ENCOUNTER — Ambulatory Visit: Payer: Self-pay | Admitting: Internal Medicine

## 2005-08-09 ENCOUNTER — Ambulatory Visit: Payer: Self-pay | Admitting: Hospitalist

## 2005-08-24 ENCOUNTER — Ambulatory Visit: Payer: Self-pay | Admitting: Internal Medicine

## 2005-08-31 ENCOUNTER — Emergency Department (HOSPITAL_COMMUNITY): Admission: EM | Admit: 2005-08-31 | Discharge: 2005-08-31 | Payer: Self-pay | Admitting: Family Medicine

## 2005-09-03 ENCOUNTER — Emergency Department (HOSPITAL_COMMUNITY): Admission: EM | Admit: 2005-09-03 | Discharge: 2005-09-03 | Payer: Self-pay | Admitting: Family Medicine

## 2005-09-07 ENCOUNTER — Ambulatory Visit: Payer: Self-pay | Admitting: Internal Medicine

## 2005-09-13 ENCOUNTER — Ambulatory Visit: Payer: Self-pay | Admitting: Hematology and Oncology

## 2005-09-21 ENCOUNTER — Ambulatory Visit: Payer: Self-pay | Admitting: Internal Medicine

## 2005-09-23 LAB — CBC WITH DIFFERENTIAL/PLATELET
BASO%: 0.6 % (ref 0.0–2.0)
EOS%: 1.6 % (ref 0.0–7.0)
HCT: 37.5 % (ref 34.8–46.6)
HGB: 12.5 g/dL (ref 11.6–15.9)
MCHC: 33.4 g/dL (ref 32.0–36.0)
MONO#: 0.5 10*3/uL (ref 0.1–0.9)
NEUT%: 50.6 % (ref 39.6–76.8)
RDW: 15.1 % — ABNORMAL HIGH (ref 11.3–14.5)
WBC: 5.7 10*3/uL (ref 3.9–10.0)
lymph#: 2.2 10*3/uL (ref 0.9–3.3)

## 2005-09-27 LAB — COMPREHENSIVE METABOLIC PANEL
ALT: 13 U/L (ref 0–40)
AST: 18 U/L (ref 0–37)
Albumin: 4.7 g/dL (ref 3.5–5.2)
CO2: 26 mEq/L (ref 19–32)
Calcium: 9.5 mg/dL (ref 8.4–10.5)
Chloride: 101 mEq/L (ref 96–112)
Creatinine, Ser: 0.7 mg/dL (ref 0.4–1.2)
Potassium: 4.5 mEq/L (ref 3.5–5.3)
Total Protein: 8 g/dL (ref 6.0–8.3)

## 2005-09-27 LAB — CARDIOLIPIN ANTIBODIES, IGG, IGM, IGA: Anticardiolipin IgG: <7 [GPL'U] (ref <11)

## 2005-09-27 LAB — FACTOR 5 LEIDEN

## 2005-09-27 LAB — HOMOCYSTEINE: Homocysteine: 12.7 umol/L (ref 4.0–15.4)

## 2005-09-27 LAB — PROTHROMBIN GENE MUTATION

## 2005-09-27 LAB — BETA-2 GLYCOPROTEIN ANTIBODIES: Beta-2 Glyco I IgG: <4 U/mL (ref <20)

## 2005-09-28 ENCOUNTER — Emergency Department (HOSPITAL_COMMUNITY): Admission: EM | Admit: 2005-09-28 | Discharge: 2005-09-29 | Payer: Self-pay | Admitting: Emergency Medicine

## 2005-09-28 ENCOUNTER — Ambulatory Visit (HOSPITAL_COMMUNITY): Admission: RE | Admit: 2005-09-28 | Discharge: 2005-09-28 | Payer: Self-pay | Admitting: Hematology and Oncology

## 2005-10-10 ENCOUNTER — Ambulatory Visit: Payer: Self-pay | Admitting: Internal Medicine

## 2005-10-13 ENCOUNTER — Ambulatory Visit (HOSPITAL_COMMUNITY): Admission: RE | Admit: 2005-10-13 | Discharge: 2005-10-13 | Payer: Self-pay | Admitting: Hematology and Oncology

## 2005-10-17 LAB — LUPUS ANTICOAGULANT PANEL: DRVVT: 63.5 secs — ABNORMAL HIGH (ref 26.75–42.95)

## 2005-10-26 ENCOUNTER — Encounter (INDEPENDENT_AMBULATORY_CARE_PROVIDER_SITE_OTHER): Payer: Self-pay | Admitting: Pulmonary Disease

## 2005-10-26 ENCOUNTER — Ambulatory Visit (HOSPITAL_COMMUNITY): Admission: RE | Admit: 2005-10-26 | Discharge: 2005-10-26 | Payer: Self-pay | Admitting: Hematology and Oncology

## 2005-11-06 ENCOUNTER — Emergency Department (HOSPITAL_COMMUNITY): Admission: EM | Admit: 2005-11-06 | Discharge: 2005-11-06 | Payer: Self-pay | Admitting: Emergency Medicine

## 2005-11-15 ENCOUNTER — Ambulatory Visit: Payer: Self-pay | Admitting: Hematology and Oncology

## 2005-11-22 LAB — PROTEIN S, TOTAL: Protein S Ag, Total: 139 % — ABNORMAL HIGH (ref 63–126)

## 2005-11-22 LAB — ANTITHROMBIN III: AntiThromb III Func: 124 % — ABNORMAL HIGH (ref 75–120)

## 2005-11-22 LAB — PROTEIN C ACTIVITY: Protein C Activity: 200 % — ABNORMAL HIGH (ref 91–147)

## 2005-11-22 LAB — PROTEIN S ACTIVITY: Protein S Activity: 161 % (ref 81–180)

## 2005-12-12 ENCOUNTER — Ambulatory Visit: Payer: Self-pay | Admitting: Internal Medicine

## 2005-12-12 ENCOUNTER — Encounter: Admission: RE | Admit: 2005-12-12 | Discharge: 2005-12-12 | Payer: Self-pay | Admitting: Hospitalist

## 2005-12-12 ENCOUNTER — Inpatient Hospital Stay (HOSPITAL_COMMUNITY): Admission: RE | Admit: 2005-12-12 | Discharge: 2005-12-14 | Payer: Self-pay | Admitting: Hospitalist

## 2005-12-12 ENCOUNTER — Ambulatory Visit: Payer: Self-pay | Admitting: Hospitalist

## 2005-12-13 ENCOUNTER — Encounter: Payer: Self-pay | Admitting: Cardiology

## 2005-12-13 ENCOUNTER — Ambulatory Visit: Payer: Self-pay | Admitting: Cardiology

## 2005-12-28 ENCOUNTER — Ambulatory Visit: Payer: Self-pay | Admitting: Internal Medicine

## 2006-02-06 ENCOUNTER — Encounter (INDEPENDENT_AMBULATORY_CARE_PROVIDER_SITE_OTHER): Payer: Self-pay | Admitting: Pulmonary Disease

## 2006-02-06 DIAGNOSIS — K089 Disorder of teeth and supporting structures, unspecified: Secondary | ICD-10-CM | POA: Insufficient documentation

## 2006-02-06 DIAGNOSIS — E785 Hyperlipidemia, unspecified: Secondary | ICD-10-CM

## 2006-02-06 DIAGNOSIS — M2420 Disorder of ligament, unspecified site: Secondary | ICD-10-CM

## 2006-02-06 DIAGNOSIS — N949 Unspecified condition associated with female genital organs and menstrual cycle: Secondary | ICD-10-CM | POA: Insufficient documentation

## 2006-02-06 DIAGNOSIS — I10 Essential (primary) hypertension: Secondary | ICD-10-CM

## 2006-02-06 DIAGNOSIS — N952 Postmenopausal atrophic vaginitis: Secondary | ICD-10-CM

## 2006-02-06 DIAGNOSIS — Z86718 Personal history of other venous thrombosis and embolism: Secondary | ICD-10-CM | POA: Insufficient documentation

## 2006-02-28 ENCOUNTER — Encounter (INDEPENDENT_AMBULATORY_CARE_PROVIDER_SITE_OTHER): Payer: Self-pay | Admitting: Pulmonary Disease

## 2006-02-28 ENCOUNTER — Ambulatory Visit: Payer: Self-pay | Admitting: Internal Medicine

## 2006-02-28 DIAGNOSIS — Z8719 Personal history of other diseases of the digestive system: Secondary | ICD-10-CM | POA: Insufficient documentation

## 2006-02-28 DIAGNOSIS — Q453 Other congenital malformations of pancreas and pancreatic duct: Secondary | ICD-10-CM | POA: Insufficient documentation

## 2006-02-28 DIAGNOSIS — G43909 Migraine, unspecified, not intractable, without status migrainosus: Secondary | ICD-10-CM | POA: Insufficient documentation

## 2006-02-28 LAB — CONVERTED CEMR LAB
Creatinine, Urine: 39.2 mg/dL
HDL: 92 mg/dL (ref >39)
Microalb Creat Ratio: 27.8 mg/g (ref 0.0–30.0)
Total CHOL/HDL Ratio: 2.3
Triglycerides: 54 mg/dL (ref <150)

## 2006-03-17 ENCOUNTER — Emergency Department (HOSPITAL_COMMUNITY): Admission: EM | Admit: 2006-03-17 | Discharge: 2006-03-17 | Payer: Self-pay | Admitting: Family Medicine

## 2006-03-21 ENCOUNTER — Emergency Department (HOSPITAL_COMMUNITY): Admission: EM | Admit: 2006-03-21 | Discharge: 2006-03-22 | Payer: Self-pay | Admitting: Emergency Medicine

## 2006-03-29 ENCOUNTER — Ambulatory Visit: Payer: Self-pay | Admitting: Hospitalist

## 2006-03-29 ENCOUNTER — Encounter (INDEPENDENT_AMBULATORY_CARE_PROVIDER_SITE_OTHER): Payer: Self-pay | Admitting: Internal Medicine

## 2006-03-29 LAB — CONVERTED CEMR LAB
BUN: 11 mg/dL (ref 6–23)
CO2: 25 meq/L (ref 19–32)
Calcium: 9.7 mg/dL (ref 8.4–10.5)
Chloride: 105 meq/L (ref 96–112)
Creatinine, Ser: 0.79 mg/dL (ref 0.40–1.20)

## 2006-04-05 ENCOUNTER — Encounter (INDEPENDENT_AMBULATORY_CARE_PROVIDER_SITE_OTHER): Payer: Self-pay | Admitting: Internal Medicine

## 2006-04-05 ENCOUNTER — Ambulatory Visit: Payer: Self-pay | Admitting: Internal Medicine

## 2006-04-05 LAB — CONVERTED CEMR LAB
BUN: 16 mg/dL (ref 6–23)
CO2: 24 meq/L (ref 19–32)
Chloride: 106 meq/L (ref 96–112)
Glucose, Bld: 87 mg/dL (ref 70–99)
Potassium: 4.4 meq/L (ref 3.5–5.3)

## 2006-05-10 ENCOUNTER — Ambulatory Visit: Payer: Self-pay | Admitting: Hospitalist

## 2006-05-19 ENCOUNTER — Emergency Department (HOSPITAL_COMMUNITY): Admission: EM | Admit: 2006-05-19 | Discharge: 2006-05-19 | Payer: Self-pay | Admitting: Emergency Medicine

## 2006-05-22 ENCOUNTER — Telehealth: Payer: Self-pay | Admitting: *Deleted

## 2006-07-09 ENCOUNTER — Emergency Department (HOSPITAL_COMMUNITY): Admission: EM | Admit: 2006-07-09 | Discharge: 2006-07-09 | Payer: Self-pay | Admitting: Emergency Medicine

## 2006-07-13 ENCOUNTER — Ambulatory Visit: Payer: Self-pay | Admitting: *Deleted

## 2006-07-28 ENCOUNTER — Ambulatory Visit: Payer: Self-pay | Admitting: Internal Medicine

## 2006-07-28 ENCOUNTER — Encounter (INDEPENDENT_AMBULATORY_CARE_PROVIDER_SITE_OTHER): Payer: Self-pay | Admitting: Ophthalmology

## 2006-07-28 LAB — CONVERTED CEMR LAB
BUN: 15 mg/dL (ref 6–23)
CO2: 24 meq/L (ref 19–32)
Calcium: 10 mg/dL (ref 8.4–10.5)
Creatinine, Ser: 0.93 mg/dL (ref 0.40–1.20)
Glucose, Bld: 82 mg/dL (ref 70–99)

## 2006-08-07 ENCOUNTER — Telehealth: Payer: Self-pay | Admitting: *Deleted

## 2006-10-02 ENCOUNTER — Ambulatory Visit: Payer: Self-pay | Admitting: Hospitalist

## 2006-10-02 ENCOUNTER — Inpatient Hospital Stay (HOSPITAL_COMMUNITY): Admission: EM | Admit: 2006-10-02 | Discharge: 2006-10-03 | Payer: Self-pay | Admitting: Emergency Medicine

## 2006-10-13 ENCOUNTER — Ambulatory Visit: Payer: Self-pay | Admitting: Hematology & Oncology

## 2006-10-13 LAB — CBC & DIFF AND RETIC
BASO%: 3.7 % — ABNORMAL HIGH (ref 0.0–2.0)
EOS%: 1.4 % (ref 0.0–7.0)
HCT: 36.8 % (ref 34.8–46.6)
HGB: 12.7 g/dL (ref 11.6–15.9)
LYMPH%: 42.2 % (ref 14.0–48.0)
MCH: 28.1 pg (ref 26.0–34.0)
MCHC: 34.5 g/dL (ref 32.0–36.0)
NEUT#: 3.9 10*3/uL (ref 1.5–6.5)
NEUT%: 46.7 % (ref 39.6–76.8)
Platelets: 365 10*3/uL (ref 145–400)
WBC: 8.4 10*3/uL (ref 3.9–10.0)

## 2006-10-13 LAB — CHCC SMEAR

## 2006-10-16 ENCOUNTER — Telehealth (INDEPENDENT_AMBULATORY_CARE_PROVIDER_SITE_OTHER): Payer: Self-pay | Admitting: *Deleted

## 2006-10-17 LAB — HEMOGLOBINOPATHY EVALUATION: Hgb A2 Quant: 3.5 % — ABNORMAL HIGH (ref 2.2–3.2)

## 2006-10-17 LAB — IRON AND TIBC
TIBC: 381 ug/dL (ref 250–470)
UIBC: 316 ug/dL

## 2006-11-10 ENCOUNTER — Ambulatory Visit: Payer: Self-pay | Admitting: Internal Medicine

## 2006-11-10 DIAGNOSIS — R229 Localized swelling, mass and lump, unspecified: Secondary | ICD-10-CM

## 2006-11-14 ENCOUNTER — Encounter (INDEPENDENT_AMBULATORY_CARE_PROVIDER_SITE_OTHER): Payer: Self-pay | Admitting: Internal Medicine

## 2006-11-14 ENCOUNTER — Encounter: Admission: RE | Admit: 2006-11-14 | Discharge: 2006-11-14 | Payer: Self-pay | Admitting: Hospitalist

## 2006-12-01 ENCOUNTER — Telehealth: Payer: Self-pay | Admitting: *Deleted

## 2006-12-06 ENCOUNTER — Emergency Department (HOSPITAL_COMMUNITY): Admission: EM | Admit: 2006-12-06 | Discharge: 2006-12-06 | Payer: Self-pay | Admitting: Emergency Medicine

## 2006-12-08 ENCOUNTER — Ambulatory Visit: Payer: Self-pay | Admitting: Internal Medicine

## 2007-01-31 ENCOUNTER — Emergency Department (HOSPITAL_COMMUNITY): Admission: EM | Admit: 2007-01-31 | Discharge: 2007-01-31 | Payer: Self-pay | Admitting: Emergency Medicine

## 2007-02-13 ENCOUNTER — Encounter (INDEPENDENT_AMBULATORY_CARE_PROVIDER_SITE_OTHER): Payer: Self-pay | Admitting: Infectious Diseases

## 2007-02-13 ENCOUNTER — Ambulatory Visit: Payer: Self-pay | Admitting: Infectious Diseases

## 2007-02-13 LAB — CONVERTED CEMR LAB
BUN: 14 mg/dL (ref 6–23)
Blood Glucose, Fingerstick: 148
CO2: 25 meq/L (ref 19–32)
Chloride: 106 meq/L (ref 96–112)
Glucose, Bld: 95 mg/dL (ref 70–99)
Potassium: 4.1 meq/L (ref 3.5–5.3)

## 2007-03-14 ENCOUNTER — Encounter (INDEPENDENT_AMBULATORY_CARE_PROVIDER_SITE_OTHER): Payer: Self-pay | Admitting: Internal Medicine

## 2007-03-15 ENCOUNTER — Ambulatory Visit: Payer: Self-pay | Admitting: Internal Medicine

## 2007-03-21 ENCOUNTER — Telehealth (INDEPENDENT_AMBULATORY_CARE_PROVIDER_SITE_OTHER): Payer: Self-pay | Admitting: Internal Medicine

## 2007-04-07 ENCOUNTER — Emergency Department (HOSPITAL_COMMUNITY): Admission: EM | Admit: 2007-04-07 | Discharge: 2007-04-07 | Payer: Self-pay | Admitting: Family Medicine

## 2007-04-11 ENCOUNTER — Encounter (INDEPENDENT_AMBULATORY_CARE_PROVIDER_SITE_OTHER): Payer: Self-pay | Admitting: Internal Medicine

## 2007-04-11 ENCOUNTER — Ambulatory Visit: Payer: Self-pay | Admitting: Internal Medicine

## 2007-04-11 DIAGNOSIS — E875 Hyperkalemia: Secondary | ICD-10-CM

## 2007-04-11 LAB — CONVERTED CEMR LAB
Anti Nuclear Antibody(ANA): NEGATIVE
Blood Glucose, Fingerstick: 104
CO2: 24 meq/L (ref 19–32)
Chloride: 103 meq/L (ref 96–112)
Creatinine, Ser: 0.67 mg/dL (ref 0.40–1.20)
Rhuematoid fact SerPl-aCnc: <20 intl units/mL (ref 0–20)

## 2007-04-24 ENCOUNTER — Ambulatory Visit: Payer: Self-pay | Admitting: Internal Medicine

## 2007-05-03 DIAGNOSIS — E785 Hyperlipidemia, unspecified: Secondary | ICD-10-CM

## 2007-05-03 HISTORY — DX: Hyperlipidemia, unspecified: E78.5

## 2007-05-10 ENCOUNTER — Emergency Department (HOSPITAL_COMMUNITY): Admission: EM | Admit: 2007-05-10 | Discharge: 2007-05-10 | Payer: Self-pay | Admitting: Family Medicine

## 2007-05-21 ENCOUNTER — Telehealth: Payer: Self-pay | Admitting: *Deleted

## 2007-05-21 ENCOUNTER — Telehealth: Payer: Self-pay | Admitting: Internal Medicine

## 2007-05-21 ENCOUNTER — Ambulatory Visit: Payer: Self-pay | Admitting: Internal Medicine

## 2007-06-29 ENCOUNTER — Telehealth: Payer: Self-pay | Admitting: *Deleted

## 2007-08-03 ENCOUNTER — Ambulatory Visit: Payer: Self-pay | Admitting: Hospitalist

## 2007-08-03 ENCOUNTER — Encounter (INDEPENDENT_AMBULATORY_CARE_PROVIDER_SITE_OTHER): Payer: Self-pay | Admitting: Internal Medicine

## 2007-08-03 DIAGNOSIS — K219 Gastro-esophageal reflux disease without esophagitis: Secondary | ICD-10-CM

## 2007-08-03 LAB — CONVERTED CEMR LAB
Blood Glucose, Fingerstick: 146
Hgb A1c MFr Bld: 7 %

## 2007-08-07 LAB — CONVERTED CEMR LAB
CO2: 22 meq/L (ref 19–32)
Cholesterol: 221 mg/dL — ABNORMAL HIGH (ref 0–200)
Creatinine, Ser: 0.85 mg/dL (ref 0.40–1.20)
Glucose, Bld: 99 mg/dL (ref 70–99)
Microalb Creat Ratio: 6.5 mg/g (ref 0.0–30.0)
Total Bilirubin: 0.4 mg/dL (ref 0.3–1.2)
Total CHOL/HDL Ratio: 2.5
Triglycerides: 140 mg/dL (ref <150)
VLDL: 28 mg/dL (ref 0–40)

## 2007-09-05 ENCOUNTER — Ambulatory Visit: Payer: Self-pay | Admitting: *Deleted

## 2007-09-05 ENCOUNTER — Encounter (INDEPENDENT_AMBULATORY_CARE_PROVIDER_SITE_OTHER): Payer: Self-pay | Admitting: Internal Medicine

## 2007-09-06 LAB — CONVERTED CEMR LAB
AST: 20 units/L (ref 0–37)
Alkaline Phosphatase: 65 units/L (ref 39–117)
BUN: 20 mg/dL (ref 6–23)
Basophils Relative: 0 % (ref 0–1)
Calcium: 10.4 mg/dL (ref 8.4–10.5)
Chloride: 103 meq/L (ref 96–112)
Creatinine, Ser: 0.93 mg/dL (ref 0.40–1.20)
Eosinophils Absolute: 0.1 10*3/uL (ref 0.0–0.7)
Free T4: 1.44 ng/dL (ref 0.89–1.80)
Hemoglobin: 12.5 g/dL (ref 12.0–15.0)
MCHC: 33.4 g/dL (ref 30.0–36.0)
MCV: 82.7 fL (ref 78.0–100.0)
Monocytes Absolute: 0.5 10*3/uL (ref 0.1–1.0)
Monocytes Relative: 7 % (ref 3–12)
RBC: 4.52 M/uL (ref 3.87–5.11)
TSH: 0.241 microintl units/mL — ABNORMAL LOW (ref 0.350–5.50)

## 2007-09-10 ENCOUNTER — Ambulatory Visit: Payer: Self-pay | Admitting: Internal Medicine

## 2007-09-10 LAB — CONVERTED CEMR LAB: Blood Glucose, Fingerstick: 142

## 2007-09-17 ENCOUNTER — Ambulatory Visit (HOSPITAL_COMMUNITY): Admission: RE | Admit: 2007-09-17 | Discharge: 2007-09-17 | Payer: Self-pay | Admitting: Internal Medicine

## 2007-09-19 ENCOUNTER — Ambulatory Visit: Payer: Self-pay | Admitting: *Deleted

## 2007-09-19 DIAGNOSIS — R070 Pain in throat: Secondary | ICD-10-CM

## 2007-09-27 ENCOUNTER — Encounter (INDEPENDENT_AMBULATORY_CARE_PROVIDER_SITE_OTHER): Payer: Self-pay | Admitting: Internal Medicine

## 2007-10-01 ENCOUNTER — Ambulatory Visit: Payer: Self-pay | Admitting: *Deleted

## 2007-10-01 ENCOUNTER — Telehealth: Payer: Self-pay | Admitting: *Deleted

## 2007-10-11 ENCOUNTER — Telehealth (INDEPENDENT_AMBULATORY_CARE_PROVIDER_SITE_OTHER): Payer: Self-pay | Admitting: Internal Medicine

## 2007-10-18 ENCOUNTER — Ambulatory Visit: Payer: Self-pay | Admitting: Internal Medicine

## 2007-11-04 ENCOUNTER — Emergency Department (HOSPITAL_COMMUNITY): Admission: EM | Admit: 2007-11-04 | Discharge: 2007-11-04 | Payer: Self-pay | Admitting: Emergency Medicine

## 2007-11-05 ENCOUNTER — Ambulatory Visit: Payer: Self-pay | Admitting: Infectious Diseases

## 2007-11-23 ENCOUNTER — Telehealth: Payer: Self-pay | Admitting: *Deleted

## 2007-11-26 ENCOUNTER — Telehealth (INDEPENDENT_AMBULATORY_CARE_PROVIDER_SITE_OTHER): Payer: Self-pay | Admitting: Internal Medicine

## 2007-12-10 ENCOUNTER — Emergency Department (HOSPITAL_COMMUNITY): Admission: EM | Admit: 2007-12-10 | Discharge: 2007-12-10 | Payer: Self-pay | Admitting: Emergency Medicine

## 2007-12-11 ENCOUNTER — Ambulatory Visit: Payer: Self-pay | Admitting: Internal Medicine

## 2007-12-11 ENCOUNTER — Encounter (INDEPENDENT_AMBULATORY_CARE_PROVIDER_SITE_OTHER): Payer: Self-pay | Admitting: Internal Medicine

## 2007-12-11 ENCOUNTER — Ambulatory Visit: Payer: Self-pay | Admitting: Cardiovascular Disease

## 2007-12-11 ENCOUNTER — Ambulatory Visit: Payer: Self-pay | Admitting: *Deleted

## 2007-12-11 ENCOUNTER — Inpatient Hospital Stay (HOSPITAL_COMMUNITY): Admission: RE | Admit: 2007-12-11 | Discharge: 2007-12-15 | Payer: Self-pay | Admitting: *Deleted

## 2007-12-11 DIAGNOSIS — G8190 Hemiplegia, unspecified affecting unspecified side: Secondary | ICD-10-CM | POA: Insufficient documentation

## 2007-12-11 LAB — CONVERTED CEMR LAB
Calcium: 10 mg/dL (ref 8.4–10.5)
Creatinine, Ser: 1.15 mg/dL (ref 0.40–1.20)
Glucose, Bld: 114 mg/dL — ABNORMAL HIGH (ref 70–99)
Sodium: 140 meq/L (ref 135–145)

## 2007-12-12 ENCOUNTER — Encounter: Payer: Self-pay | Admitting: Internal Medicine

## 2007-12-12 ENCOUNTER — Ambulatory Visit: Payer: Self-pay | Admitting: Vascular Surgery

## 2007-12-24 ENCOUNTER — Telehealth (INDEPENDENT_AMBULATORY_CARE_PROVIDER_SITE_OTHER): Payer: Self-pay | Admitting: Internal Medicine

## 2008-01-14 ENCOUNTER — Telehealth (INDEPENDENT_AMBULATORY_CARE_PROVIDER_SITE_OTHER): Payer: Self-pay | Admitting: Internal Medicine

## 2008-01-16 ENCOUNTER — Ambulatory Visit: Payer: Self-pay | Admitting: Internal Medicine

## 2008-01-16 LAB — CONVERTED CEMR LAB: Blood Glucose, Fingerstick: 169

## 2008-01-25 ENCOUNTER — Telehealth (INDEPENDENT_AMBULATORY_CARE_PROVIDER_SITE_OTHER): Payer: Self-pay | Admitting: Internal Medicine

## 2008-02-12 ENCOUNTER — Telehealth (INDEPENDENT_AMBULATORY_CARE_PROVIDER_SITE_OTHER): Payer: Self-pay | Admitting: Internal Medicine

## 2008-03-03 ENCOUNTER — Encounter: Payer: Self-pay | Admitting: Internal Medicine

## 2008-03-03 ENCOUNTER — Inpatient Hospital Stay (HOSPITAL_COMMUNITY): Admission: AD | Admit: 2008-03-03 | Discharge: 2008-03-03 | Payer: Self-pay | Admitting: Family Medicine

## 2008-03-03 ENCOUNTER — Ambulatory Visit: Payer: Self-pay | Admitting: *Deleted

## 2008-03-03 ENCOUNTER — Telehealth: Payer: Self-pay | Admitting: *Deleted

## 2008-03-03 DIAGNOSIS — R109 Unspecified abdominal pain: Secondary | ICD-10-CM

## 2008-03-03 LAB — CONVERTED CEMR LAB
Bilirubin Urine: NEGATIVE
Hemoglobin, Urine: NEGATIVE
Ketones, ur: NEGATIVE mg/dL
Nitrite: NEGATIVE
Protein, ur: NEGATIVE mg/dL
Urobilinogen, UA: 0.2 (ref 0.0–1.0)
WBC, UA: NONE SEEN cells/hpf (ref <3)

## 2008-03-04 ENCOUNTER — Encounter: Payer: Self-pay | Admitting: Internal Medicine

## 2008-03-07 ENCOUNTER — Telehealth: Payer: Self-pay | Admitting: *Deleted

## 2008-03-21 ENCOUNTER — Ambulatory Visit: Payer: Self-pay | Admitting: Internal Medicine

## 2008-03-21 ENCOUNTER — Encounter (INDEPENDENT_AMBULATORY_CARE_PROVIDER_SITE_OTHER): Payer: Self-pay | Admitting: *Deleted

## 2008-03-21 DIAGNOSIS — M79609 Pain in unspecified limb: Secondary | ICD-10-CM

## 2008-03-21 LAB — CONVERTED CEMR LAB
Amphetamine Screen, Ur: NEGATIVE
Barbiturate Quant, Ur: NEGATIVE
Blood Glucose, Fingerstick: 116
CO2: 25 meq/L (ref 19–32)
Chloride: 104 meq/L (ref 96–112)
Creatinine, Ser: 0.83 mg/dL (ref 0.40–1.20)
HDL: 119 mg/dL (ref >39)
Hgb A1c MFr Bld: 6.5 %
LDL Cholesterol: 105 mg/dL — ABNORMAL HIGH (ref 0–99)
Marijuana Metabolite: NEGATIVE
Methadone: NEGATIVE
Potassium: 3.5 meq/L (ref 3.5–5.3)
Propoxyphene: POSITIVE — AB
Sodium: 142 meq/L (ref 135–145)
Total CHOL/HDL Ratio: 2.1
Triglycerides: 122 mg/dL (ref <150)

## 2008-03-31 ENCOUNTER — Emergency Department (HOSPITAL_COMMUNITY): Admission: EM | Admit: 2008-03-31 | Discharge: 2008-03-31 | Payer: Self-pay | Admitting: *Deleted

## 2008-04-07 ENCOUNTER — Telehealth: Payer: Self-pay | Admitting: *Deleted

## 2008-05-02 DIAGNOSIS — E119 Type 2 diabetes mellitus without complications: Secondary | ICD-10-CM

## 2008-05-02 HISTORY — DX: Type 2 diabetes mellitus without complications: E11.9

## 2008-05-06 ENCOUNTER — Telehealth: Payer: Self-pay | Admitting: *Deleted

## 2008-05-20 ENCOUNTER — Emergency Department (HOSPITAL_COMMUNITY): Admission: EM | Admit: 2008-05-20 | Discharge: 2008-05-20 | Payer: Self-pay | Admitting: Emergency Medicine

## 2008-06-02 ENCOUNTER — Telehealth: Payer: Self-pay | Admitting: *Deleted

## 2008-06-04 ENCOUNTER — Ambulatory Visit: Payer: Self-pay | Admitting: *Deleted

## 2008-06-04 ENCOUNTER — Encounter (INDEPENDENT_AMBULATORY_CARE_PROVIDER_SITE_OTHER): Payer: Self-pay | Admitting: Internal Medicine

## 2008-06-04 ENCOUNTER — Encounter: Payer: Self-pay | Admitting: Internal Medicine

## 2008-06-04 LAB — CONVERTED CEMR LAB: Blood Glucose, Fingerstick: 98

## 2008-06-08 LAB — CONVERTED CEMR LAB
CO2: 20 meq/L (ref 19–32)
Calcium: 9.6 mg/dL (ref 8.4–10.5)
Creatinine, Ser: 0.61 mg/dL (ref 0.40–1.20)
Glucose, Bld: 95 mg/dL (ref 70–99)

## 2008-08-18 ENCOUNTER — Telehealth: Payer: Self-pay | Admitting: *Deleted

## 2008-09-04 ENCOUNTER — Emergency Department (HOSPITAL_COMMUNITY): Admission: EM | Admit: 2008-09-04 | Discharge: 2008-09-04 | Payer: Self-pay | Admitting: Emergency Medicine

## 2008-09-12 ENCOUNTER — Encounter (INDEPENDENT_AMBULATORY_CARE_PROVIDER_SITE_OTHER): Payer: Self-pay | Admitting: Internal Medicine

## 2008-09-12 ENCOUNTER — Ambulatory Visit (HOSPITAL_COMMUNITY): Admission: RE | Admit: 2008-09-12 | Discharge: 2008-09-12 | Payer: Self-pay | Admitting: Internal Medicine

## 2008-09-12 ENCOUNTER — Ambulatory Visit: Payer: Self-pay | Admitting: Internal Medicine

## 2008-09-12 DIAGNOSIS — R310 Gross hematuria: Secondary | ICD-10-CM | POA: Insufficient documentation

## 2008-09-12 DIAGNOSIS — N390 Urinary tract infection, site not specified: Secondary | ICD-10-CM

## 2008-09-12 LAB — CONVERTED CEMR LAB
Alkaline Phosphatase: 82 units/L (ref 39–117)
BUN: 9 mg/dL (ref 6–23)
Blood Glucose, Fingerstick: 128
Creatinine, Urine: 77 mg/dL
GFR calc non Af Amer: >60 mL/min (ref >60)
Glucose, Bld: 98 mg/dL (ref 70–99)
Hgb A1c MFr Bld: 6.6 %
Microalb Creat Ratio: 17.4 mg/g (ref 0.0–30.0)
Microalb, Ur: 1.34 mg/dL (ref 0.00–1.89)
Nitrite: NEGATIVE
Protein, ur: NEGATIVE mg/dL
Total Bilirubin: 0.6 mg/dL (ref 0.3–1.2)
Urobilinogen, UA: 1 (ref 0.0–1.0)

## 2008-09-17 ENCOUNTER — Telehealth (INDEPENDENT_AMBULATORY_CARE_PROVIDER_SITE_OTHER): Payer: Self-pay | Admitting: Internal Medicine

## 2008-09-18 ENCOUNTER — Telehealth: Payer: Self-pay | Admitting: *Deleted

## 2008-09-23 ENCOUNTER — Encounter (INDEPENDENT_AMBULATORY_CARE_PROVIDER_SITE_OTHER): Payer: Self-pay | Admitting: *Deleted

## 2008-09-23 ENCOUNTER — Ambulatory Visit: Payer: Self-pay | Admitting: *Deleted

## 2008-09-23 ENCOUNTER — Encounter (INDEPENDENT_AMBULATORY_CARE_PROVIDER_SITE_OTHER): Payer: Self-pay | Admitting: Internal Medicine

## 2008-09-23 LAB — CONVERTED CEMR LAB
Barbiturate Quant, Ur: NEGATIVE
Bilirubin Urine: NEGATIVE
Creatinine,U: 88.6 mg/dL
Gardnerella vaginalis: POSITIVE — AB
Glucose, Urine, Semiquant: NEGATIVE
Hemoglobin, Urine: NEGATIVE
Ketones, ur: NEGATIVE mg/dL
Ketones, urine, test strip: NEGATIVE
Methadone: NEGATIVE
Nitrite: NEGATIVE
Opiates: NEGATIVE
Propoxyphene: POSITIVE — AB
Protein, U semiquant: NEGATIVE
RBC / HPF: NONE SEEN (ref <3)
Specific Gravity, Urine: >=1.03
Specific Gravity, Urine: 1.018 (ref 1.005–1.030)
Urine Glucose: NEGATIVE mg/dL
Urobilinogen, UA: 0.2
pH: 5.5 (ref 5.0–8.0)

## 2008-09-24 ENCOUNTER — Encounter (INDEPENDENT_AMBULATORY_CARE_PROVIDER_SITE_OTHER): Payer: Self-pay | Admitting: Internal Medicine

## 2008-09-30 ENCOUNTER — Telehealth (INDEPENDENT_AMBULATORY_CARE_PROVIDER_SITE_OTHER): Payer: Self-pay | Admitting: Internal Medicine

## 2008-10-07 ENCOUNTER — Ambulatory Visit: Payer: Self-pay | Admitting: Internal Medicine

## 2008-10-07 ENCOUNTER — Encounter (INDEPENDENT_AMBULATORY_CARE_PROVIDER_SITE_OTHER): Payer: Self-pay | Admitting: Internal Medicine

## 2008-10-07 LAB — CONVERTED CEMR LAB
Albumin: 4 g/dL (ref 3.5–5.2)
BUN: 8 mg/dL (ref 6–23)
Basophils Absolute: 0.3 10*3/uL — ABNORMAL HIGH (ref 0.0–0.1)
Blood Glucose, Fingerstick: 126
Calcium: 10.4 mg/dL (ref 8.4–10.5)
Chloride: 108 meq/L (ref 96–112)
Eosinophils Absolute: 0.1 10*3/uL (ref 0.0–0.7)
GFR calc non Af Amer: >60 mL/min (ref >60)
Glucose, Bld: 104 mg/dL — ABNORMAL HIGH (ref 70–99)
Ketones, ur: NEGATIVE mg/dL
Lymphocytes Relative: 43 % (ref 12–46)
Lymphs Abs: 3 10*3/uL (ref 0.7–4.0)
MCV: 85.5 fL (ref 78.0–100.0)
Neutrophils Relative %: 47 % (ref 43–77)
Nitrite: NEGATIVE
Platelets: 358 10*3/uL (ref 150–400)
Potassium: 4.1 meq/L (ref 3.5–5.3)
Protein, ur: NEGATIVE mg/dL
Specific Gravity, Urine: 1.017 (ref 1.005–1.030)
Urobilinogen, UA: 0.2 (ref 0.0–1.0)
WBC: 6.9 10*3/uL (ref 4.0–10.5)

## 2008-10-08 ENCOUNTER — Encounter (INDEPENDENT_AMBULATORY_CARE_PROVIDER_SITE_OTHER): Payer: Self-pay | Admitting: Internal Medicine

## 2009-01-07 ENCOUNTER — Emergency Department (HOSPITAL_COMMUNITY): Admission: EM | Admit: 2009-01-07 | Discharge: 2009-01-07 | Payer: Self-pay | Admitting: Emergency Medicine

## 2009-01-19 ENCOUNTER — Telehealth (INDEPENDENT_AMBULATORY_CARE_PROVIDER_SITE_OTHER): Payer: Self-pay | Admitting: Internal Medicine

## 2009-01-21 ENCOUNTER — Ambulatory Visit: Payer: Self-pay | Admitting: Internal Medicine

## 2009-01-21 LAB — CONVERTED CEMR LAB
BUN: 11 mg/dL (ref 6–23)
Blood Glucose, Fingerstick: 60
CO2: 26 meq/L (ref 19–32)
Chloride: 103 meq/L (ref 96–112)
Creatinine, Ser: 0.73 mg/dL (ref 0.40–1.20)
Glucose, Bld: 105 mg/dL — ABNORMAL HIGH (ref 70–99)
HCT: 38.1 % (ref 36.0–46.0)
Hemoglobin: 12.7 g/dL (ref 12.0–15.0)
RBC: 4.67 M/uL (ref 3.87–5.11)
RDW: 14.2 % (ref 11.5–15.5)
WBC: 8 10*3/uL (ref 4.0–10.5)

## 2009-04-01 ENCOUNTER — Telehealth: Payer: Self-pay | Admitting: Internal Medicine

## 2009-04-02 ENCOUNTER — Encounter: Payer: Self-pay | Admitting: Internal Medicine

## 2009-04-13 ENCOUNTER — Inpatient Hospital Stay (HOSPITAL_COMMUNITY): Admission: RE | Admit: 2009-04-13 | Discharge: 2009-04-15 | Payer: Self-pay | Admitting: Internal Medicine

## 2009-04-13 ENCOUNTER — Ambulatory Visit: Payer: Self-pay | Admitting: Internal Medicine

## 2009-04-13 ENCOUNTER — Encounter (INDEPENDENT_AMBULATORY_CARE_PROVIDER_SITE_OTHER): Payer: Self-pay | Admitting: Dermatology

## 2009-04-13 DIAGNOSIS — R071 Chest pain on breathing: Secondary | ICD-10-CM | POA: Insufficient documentation

## 2009-04-13 DIAGNOSIS — J00 Acute nasopharyngitis [common cold]: Secondary | ICD-10-CM | POA: Insufficient documentation

## 2009-04-15 ENCOUNTER — Encounter (INDEPENDENT_AMBULATORY_CARE_PROVIDER_SITE_OTHER): Payer: Self-pay | Admitting: Dermatology

## 2009-04-20 ENCOUNTER — Ambulatory Visit: Payer: Self-pay | Admitting: Internal Medicine

## 2009-04-20 ENCOUNTER — Telehealth: Payer: Self-pay | Admitting: Internal Medicine

## 2009-05-04 ENCOUNTER — Ambulatory Visit: Payer: Self-pay | Admitting: Internal Medicine

## 2009-05-19 ENCOUNTER — Telehealth (INDEPENDENT_AMBULATORY_CARE_PROVIDER_SITE_OTHER): Payer: Self-pay | Admitting: Internal Medicine

## 2009-05-21 ENCOUNTER — Emergency Department (HOSPITAL_COMMUNITY): Admission: EM | Admit: 2009-05-21 | Discharge: 2009-05-21 | Payer: Self-pay | Admitting: Emergency Medicine

## 2009-05-22 ENCOUNTER — Telehealth (INDEPENDENT_AMBULATORY_CARE_PROVIDER_SITE_OTHER): Payer: Self-pay | Admitting: Internal Medicine

## 2009-05-25 ENCOUNTER — Emergency Department (HOSPITAL_COMMUNITY): Admission: EM | Admit: 2009-05-25 | Discharge: 2009-05-25 | Payer: Self-pay | Admitting: Emergency Medicine

## 2009-06-05 ENCOUNTER — Ambulatory Visit: Payer: Self-pay | Admitting: Internal Medicine

## 2009-06-05 LAB — CONVERTED CEMR LAB
Albumin: 4.6 g/dL (ref 3.5–5.2)
BUN: 7 mg/dL (ref 6–23)
CO2: 24 meq/L (ref 19–32)
Calcium: 10.1 mg/dL (ref 8.4–10.5)
Chloride: 103 meq/L (ref 96–112)
Cholesterol: 214 mg/dL — ABNORMAL HIGH (ref 0–200)
Creatinine, Ser: 0.66 mg/dL (ref 0.40–1.20)
HDL: 92 mg/dL (ref >39)
Potassium: 3.5 meq/L (ref 3.5–5.3)
Total CHOL/HDL Ratio: 2.3

## 2009-07-20 ENCOUNTER — Emergency Department (HOSPITAL_COMMUNITY): Admission: EM | Admit: 2009-07-20 | Discharge: 2009-07-20 | Payer: Self-pay | Admitting: Emergency Medicine

## 2009-08-19 ENCOUNTER — Encounter (INDEPENDENT_AMBULATORY_CARE_PROVIDER_SITE_OTHER): Payer: Self-pay | Admitting: Internal Medicine

## 2009-08-19 LAB — HM DIABETES EYE EXAM

## 2009-09-04 ENCOUNTER — Telehealth (INDEPENDENT_AMBULATORY_CARE_PROVIDER_SITE_OTHER): Payer: Self-pay | Admitting: Internal Medicine

## 2009-09-07 ENCOUNTER — Ambulatory Visit (HOSPITAL_COMMUNITY): Admission: RE | Admit: 2009-09-07 | Discharge: 2009-09-07 | Payer: Self-pay | Admitting: Internal Medicine

## 2009-09-07 ENCOUNTER — Ambulatory Visit: Payer: Self-pay | Admitting: Internal Medicine

## 2009-09-07 ENCOUNTER — Encounter (INDEPENDENT_AMBULATORY_CARE_PROVIDER_SITE_OTHER): Payer: Self-pay | Admitting: Internal Medicine

## 2009-09-07 LAB — CONVERTED CEMR LAB
Albumin: 4.4 g/dL (ref 3.5–5.2)
Alkaline Phosphatase: 86 units/L (ref 39–117)
BUN: 10 mg/dL (ref 6–23)
CO2: 28 meq/L (ref 19–32)
Glucose, Bld: 109 mg/dL — ABNORMAL HIGH (ref 70–99)
Hemoglobin, Urine: NEGATIVE
Hemoglobin: 13.3 g/dL (ref 12.0–15.0)
Leukocytes, UA: NEGATIVE
Lipase: 45 units/L (ref 11–59)
MCHC: 33.3 g/dL (ref 30.0–36.0)
Nitrite: NEGATIVE
Potassium: 3.7 meq/L (ref 3.5–5.3)
RBC: 4.92 M/uL (ref 3.87–5.11)
Specific Gravity, Urine: 1.018 (ref 1.005–1.0)
Total Bilirubin: 0.9 mg/dL (ref 0.3–1.2)
Total Protein: 8.7 g/dL — ABNORMAL HIGH (ref 6.0–8.3)
Urine Glucose: NEGATIVE mg/dL
WBC: 6.3 10*3/uL (ref 4.0–10.5)
pH: 5 (ref 5.0–8.0)

## 2009-09-11 ENCOUNTER — Encounter: Payer: Self-pay | Admitting: Internal Medicine

## 2009-09-11 ENCOUNTER — Inpatient Hospital Stay (HOSPITAL_COMMUNITY): Admission: EM | Admit: 2009-09-11 | Discharge: 2009-09-19 | Payer: Self-pay | Admitting: Emergency Medicine

## 2009-09-11 ENCOUNTER — Ambulatory Visit: Payer: Self-pay | Admitting: Internal Medicine

## 2009-09-12 ENCOUNTER — Ambulatory Visit: Payer: Self-pay | Admitting: Internal Medicine

## 2009-09-19 ENCOUNTER — Encounter (INDEPENDENT_AMBULATORY_CARE_PROVIDER_SITE_OTHER): Payer: Self-pay | Admitting: Internal Medicine

## 2009-10-02 ENCOUNTER — Encounter: Payer: Self-pay | Admitting: Internal Medicine

## 2009-10-02 ENCOUNTER — Telehealth: Payer: Self-pay | Admitting: Internal Medicine

## 2009-10-05 ENCOUNTER — Telehealth: Payer: Self-pay | Admitting: Internal Medicine

## 2009-10-09 ENCOUNTER — Ambulatory Visit: Payer: Self-pay | Admitting: Internal Medicine

## 2009-10-09 LAB — CONVERTED CEMR LAB
Blood Glucose, Fingerstick: 96
Hgb A1c MFr Bld: 6.8 %

## 2009-10-16 ENCOUNTER — Encounter: Payer: Self-pay | Admitting: Internal Medicine

## 2009-10-20 ENCOUNTER — Ambulatory Visit: Payer: Self-pay | Admitting: Internal Medicine

## 2009-10-20 LAB — CONVERTED CEMR LAB: Blood Glucose, Fingerstick: 97

## 2009-10-21 ENCOUNTER — Encounter: Payer: Self-pay | Admitting: Internal Medicine

## 2009-11-05 ENCOUNTER — Ambulatory Visit: Payer: Self-pay | Admitting: Internal Medicine

## 2009-11-05 LAB — CONVERTED CEMR LAB: Cholesterol, target level: 200 mg/dL

## 2009-11-10 ENCOUNTER — Ambulatory Visit (HOSPITAL_COMMUNITY): Admission: RE | Admit: 2009-11-10 | Discharge: 2009-11-10 | Payer: Self-pay

## 2009-11-23 ENCOUNTER — Ambulatory Visit: Payer: Self-pay | Admitting: Internal Medicine

## 2009-11-23 ENCOUNTER — Telehealth: Payer: Self-pay | Admitting: Internal Medicine

## 2009-11-23 LAB — CONVERTED CEMR LAB
BUN: 9 mg/dL (ref 6–23)
Bilirubin Urine: NEGATIVE
CO2: 21 meq/L (ref 19–32)
Calcium: 9.3 mg/dL (ref 8.4–10.5)
Creatinine, Ser: 0.68 mg/dL (ref 0.40–1.20)
Crystals: NONE SEEN
Glucose, Bld: 73 mg/dL (ref 70–99)
Hemoglobin, Urine: NEGATIVE
Ketones, ur: NEGATIVE mg/dL
Lipase: 56 units/L (ref 0–75)
Nitrite: NEGATIVE
Protein, ur: NEGATIVE mg/dL
Specific Gravity, Urine: 1.014 (ref 1.005–1.0)
Urobilinogen, UA: 0.2 (ref 0.0–1.0)

## 2009-12-02 ENCOUNTER — Encounter: Payer: Self-pay | Admitting: Internal Medicine

## 2009-12-13 ENCOUNTER — Emergency Department (HOSPITAL_COMMUNITY): Admission: EM | Admit: 2009-12-13 | Discharge: 2009-12-13 | Payer: Self-pay | Admitting: Emergency Medicine

## 2010-01-02 ENCOUNTER — Emergency Department (HOSPITAL_COMMUNITY): Admission: EM | Admit: 2010-01-02 | Discharge: 2010-01-02 | Payer: Self-pay | Admitting: Emergency Medicine

## 2010-01-20 ENCOUNTER — Emergency Department (HOSPITAL_COMMUNITY): Admission: EM | Admit: 2010-01-20 | Discharge: 2010-01-20 | Payer: Self-pay | Admitting: Emergency Medicine

## 2010-02-08 ENCOUNTER — Ambulatory Visit: Payer: Self-pay | Admitting: Internal Medicine

## 2010-02-08 LAB — CONVERTED CEMR LAB: Hgb A1c MFr Bld: 6.5 %

## 2010-02-18 ENCOUNTER — Emergency Department (HOSPITAL_COMMUNITY)
Admission: EM | Admit: 2010-02-18 | Discharge: 2010-02-18 | Payer: Self-pay | Source: Home / Self Care | Admitting: Emergency Medicine

## 2010-03-11 ENCOUNTER — Ambulatory Visit: Payer: Self-pay | Admitting: Obstetrics and Gynecology

## 2010-03-11 LAB — CONVERTED CEMR LAB: Pap Smear: NEGATIVE

## 2010-03-11 LAB — HM PAP SMEAR: HM Pap smear: NEGATIVE

## 2010-03-16 ENCOUNTER — Ambulatory Visit: Payer: Self-pay | Admitting: Internal Medicine

## 2010-03-16 LAB — CONVERTED CEMR LAB
Albumin: 5.4 g/dL — ABNORMAL HIGH (ref 3.5–5.2)
Alkaline Phosphatase: 88 units/L (ref 39–117)
BUN: 10 mg/dL (ref 6–23)
Calcium: 10.7 mg/dL — ABNORMAL HIGH (ref 8.4–10.5)
Chloride: 100 meq/L (ref 96–112)
Creatinine, Ser: 0.75 mg/dL (ref 0.40–1.20)
Creatinine, Urine: 81 mg/dL
Glucose, Bld: 116 mg/dL — ABNORMAL HIGH (ref 70–99)
Leukocytes, UA: NEGATIVE
Microalb Creat Ratio: 14.6 mg/g (ref 0.0–30.0)
Nitrite: NEGATIVE
Platelets: 414 10*3/uL — ABNORMAL HIGH (ref 150–400)
Potassium: 3.9 meq/L (ref 3.5–5.3)
RDW: 14.5 % (ref 11.5–15.5)
Specific Gravity, Urine: 1.014 (ref 1.005–1.0)
Urobilinogen, UA: 0.2 (ref 0.0–1.0)
WBC: 7.8 10*3/uL (ref 4.0–10.5)

## 2010-03-17 ENCOUNTER — Encounter: Payer: Self-pay | Admitting: Internal Medicine

## 2010-03-17 LAB — CONVERTED CEMR LAB: Lipase: 52 units/L (ref 0–75)

## 2010-03-19 ENCOUNTER — Telehealth: Payer: Self-pay | Admitting: Internal Medicine

## 2010-03-29 ENCOUNTER — Ambulatory Visit (HOSPITAL_COMMUNITY)
Admission: RE | Admit: 2010-03-29 | Discharge: 2010-03-29 | Payer: Self-pay | Source: Home / Self Care | Admitting: Internal Medicine

## 2010-03-29 LAB — HM MAMMOGRAPHY: HM Mammogram: NEGATIVE

## 2010-04-01 ENCOUNTER — Encounter: Payer: Self-pay | Admitting: Internal Medicine

## 2010-04-01 ENCOUNTER — Ambulatory Visit: Payer: Self-pay | Admitting: Internal Medicine

## 2010-04-01 LAB — CONVERTED CEMR LAB: Blood Glucose, Fingerstick: 168

## 2010-04-08 ENCOUNTER — Ambulatory Visit (HOSPITAL_COMMUNITY)
Admission: RE | Admit: 2010-04-08 | Discharge: 2010-04-08 | Payer: Self-pay | Source: Home / Self Care | Attending: Internal Medicine | Admitting: Internal Medicine

## 2010-04-14 ENCOUNTER — Observation Stay (HOSPITAL_COMMUNITY)
Admission: AD | Admit: 2010-04-14 | Discharge: 2010-04-15 | Payer: Self-pay | Source: Home / Self Care | Attending: Internal Medicine | Admitting: Internal Medicine

## 2010-04-14 ENCOUNTER — Ambulatory Visit: Payer: Self-pay | Admitting: Internal Medicine

## 2010-04-14 ENCOUNTER — Encounter: Payer: Self-pay | Admitting: Internal Medicine

## 2010-04-14 DIAGNOSIS — K859 Acute pancreatitis without necrosis or infection, unspecified: Secondary | ICD-10-CM | POA: Insufficient documentation

## 2010-04-14 LAB — CONVERTED CEMR LAB: Blood Glucose, Fingerstick: 78

## 2010-04-15 ENCOUNTER — Encounter: Payer: Self-pay | Admitting: Internal Medicine

## 2010-04-20 ENCOUNTER — Encounter: Payer: Self-pay | Admitting: Internal Medicine

## 2010-05-05 ENCOUNTER — Ambulatory Visit: Admission: RE | Admit: 2010-05-05 | Discharge: 2010-05-05 | Payer: Self-pay | Source: Home / Self Care

## 2010-05-05 LAB — GLUCOSE, CAPILLARY: Glucose-Capillary: 73 mg/dL (ref 70–99)

## 2010-05-19 ENCOUNTER — Telehealth: Payer: Self-pay | Admitting: *Deleted

## 2010-05-23 ENCOUNTER — Encounter: Payer: Self-pay | Admitting: Internal Medicine

## 2010-05-23 ENCOUNTER — Encounter: Payer: Self-pay | Admitting: Hematology and Oncology

## 2010-05-25 ENCOUNTER — Emergency Department (HOSPITAL_COMMUNITY)
Admission: EM | Admit: 2010-05-25 | Discharge: 2010-05-25 | Payer: Self-pay | Source: Home / Self Care | Admitting: Emergency Medicine

## 2010-05-25 ENCOUNTER — Inpatient Hospital Stay (HOSPITAL_COMMUNITY)
Admission: AD | Admit: 2010-05-25 | Discharge: 2010-05-27 | Payer: Self-pay | Attending: Internal Medicine | Admitting: Internal Medicine

## 2010-05-25 ENCOUNTER — Encounter: Payer: Self-pay | Admitting: Internal Medicine

## 2010-05-26 LAB — URINE CULTURE
Colony Count: NO GROWTH
Culture  Setup Time: 201201241138
Culture: NO GROWTH

## 2010-05-26 LAB — GLUCOSE, CAPILLARY
Glucose-Capillary: 114 mg/dL — ABNORMAL HIGH (ref 70–99)
Glucose-Capillary: 71 mg/dL (ref 70–99)
Glucose-Capillary: 76 mg/dL (ref 70–99)
Glucose-Capillary: 143 mg/dL — ABNORMAL HIGH (ref 70–99)
Glucose-Capillary: 66 mg/dL — ABNORMAL LOW (ref 70–99)
Glucose-Capillary: 134 mg/dL — ABNORMAL HIGH (ref 70–99)

## 2010-05-26 LAB — COMPREHENSIVE METABOLIC PANEL
ALT: 12 U/L (ref 0–35)
AST: 19 U/L (ref 0–37)
Albumin: 4.2 g/dL (ref 3.5–5.2)
Alkaline Phosphatase: 61 U/L (ref 39–117)
Alkaline Phosphatase: 50 U/L (ref 39–117)
BUN: 5 mg/dL — ABNORMAL LOW (ref 6–23)
BUN: 4 mg/dL — ABNORMAL LOW (ref 6–23)
CO2: 24 mEq/L (ref 19–32)
Chloride: 105 mEq/L (ref 96–112)
GFR calc Af Amer: >60 mL/min (ref >60)
GFR calc non Af Amer: >60 mL/min (ref >60)
Glucose, Bld: 74 mg/dL (ref 70–99)
Potassium: 4.1 mEq/L (ref 3.5–5.1)
Potassium: 3.4 mEq/L — ABNORMAL LOW (ref 3.5–5.1)
Sodium: 140 mEq/L (ref 135–145)
Total Bilirubin: 1 mg/dL (ref 0.3–1.2)
Total Protein: 8.3 g/dL (ref 6.0–8.3)

## 2010-05-26 LAB — CBC
HCT: 31.1 % — ABNORMAL LOW (ref 36.0–46.0)
Hemoglobin: 10.3 g/dL — ABNORMAL LOW (ref 12.0–15.0)
MCHC: 33.1 g/dL (ref 30.0–36.0)
MCV: 77.9 fL — ABNORMAL LOW (ref 78.0–100.0)
Platelets: 363 10*3/uL (ref 150–400)
RBC: 4.67 MIL/uL (ref 3.87–5.11)
RDW: 14.8 % (ref 11.5–15.5)
RDW: 14.5 % (ref 11.5–15.5)
WBC: 6.7 10*3/uL (ref 4.0–10.5)
WBC: 8.6 10*3/uL (ref 4.0–10.5)

## 2010-05-26 LAB — DIFFERENTIAL
Basophils Absolute: 0 10*3/uL (ref 0.0–0.1)
Basophils Relative: 0 % (ref 0–1)
Eosinophils Absolute: 0.1 10*3/uL (ref 0.0–0.7)
Eosinophils Relative: 1 % (ref 0–5)
Neutrophils Relative %: 46 % (ref 43–77)

## 2010-05-26 LAB — URINALYSIS, ROUTINE W REFLEX MICROSCOPIC
Ketones, ur: NEGATIVE mg/dL
Nitrite: NEGATIVE
Specific Gravity, Urine: 1.011 (ref 1.005–1.030)
Urine Glucose, Fasting: NEGATIVE mg/dL
pH: 7.5 (ref 5.0–8.0)

## 2010-05-26 LAB — URINE MICROSCOPIC-ADD ON

## 2010-05-27 ENCOUNTER — Ambulatory Visit: Admit: 2010-05-27 | Payer: Self-pay | Admitting: Internal Medicine

## 2010-05-27 ENCOUNTER — Encounter: Payer: Self-pay | Admitting: Internal Medicine

## 2010-05-27 LAB — GLUCOSE, CAPILLARY
Glucose-Capillary: 79 mg/dL (ref 70–99)
Glucose-Capillary: 93 mg/dL (ref 70–99)
Glucose-Capillary: 95 mg/dL (ref 70–99)

## 2010-05-27 LAB — DRUGS OF ABUSE SCREEN W/O ALC, ROUTINE URINE
Barbiturate Quant, Ur: NEGATIVE
Cocaine Metabolites: NEGATIVE
Creatinine,U: 30.5 mg/dL
Opiate Screen, Urine: POSITIVE — AB
Phencyclidine (PCP): NEGATIVE
Propoxyphene: NEGATIVE

## 2010-05-27 LAB — BASIC METABOLIC PANEL
BUN: 3 mg/dL — ABNORMAL LOW (ref 6–23)
Calcium: 9.2 mg/dL (ref 8.4–10.5)
GFR calc non Af Amer: >60 mL/min (ref >60)
Glucose, Bld: 88 mg/dL (ref 70–99)
Potassium: 3.4 mEq/L — ABNORMAL LOW (ref 3.5–5.1)
Sodium: 140 mEq/L (ref 135–145)

## 2010-05-27 LAB — CBC
Hemoglobin: 11.4 g/dL — ABNORMAL LOW (ref 12.0–15.0)
MCH: 25.4 pg — ABNORMAL LOW (ref 26.0–34.0)
MCHC: 33.1 g/dL (ref 30.0–36.0)
RDW: 14.3 % (ref 11.5–15.5)

## 2010-06-01 NOTE — Assessment & Plan Note (Signed)
Summary: FU/SB.   Vital Signs:  Patient profile:   55 year old female Height:      61.5 inches (156.21 cm) Weight:      113.5 pounds (51 kg) BMI:     21.17 Temp:     97.4 degrees F (36.33 degrees C) oral Pulse rate:   84 / minute BP sitting:   162 / 90  (left arm) Cuff size:   regular  Vitals Entered By: Theotis Barrio NT II (October 20, 2009 2:02 PM) CC: PATIENT STATES SHE IS HERE FOR FOLLOW UP APPT-HAVING SURGERY 6/22 AT [facility] Is Patient Diabetic? Yes Did you bring your meter with you today? No Pain Assessment Patient in pain? yes     Location: LEFT SIDE Intensity:     8 Type: STABBING Onset of pain  FOR ABOUT 4 WEEKS Nutritional Status BMI of 19 -24 = normal CBG Result 97  Have you ever been in a relationship where you felt threatened, hurt or afraid?No   Does patient need assistance? Functional Status Self care Ambulation Normal Comments HAVING SURGERY 6/22 AT [facility]   Primary Care Provider:  Elby Showers MD  CC:  PATIENT STATES SHE IS HERE FOR FOLLOW UP APPT-HAVING SURGERY 6/22 AT [facility].  History of Present Illness: [name] is a 55 year old Female with PMH/problems as outlined in the EMR, who presents to the [facility] with chief complaint(s) of   - abd pain: pancreatitis due to pancreas divisum, going for surgery at [facility] tomorrow. She needs pain meds until she can go to [facility].  - high BP: noted to be very high SBP 170s last time, lisinopril increased to 20, in here for review.   Preventive Screening-Counseling & Management  Alcohol-Tobacco     Alcohol drinks/day: 0     Smoking Status: quit     Year Quit: 2000     Pack years: 30  Caffeine-Diet-Exercise     Does Patient Exercise: yes     Type of exercise: FLOOR EXERCISE     Times/week: 2  Current Medications (verified): 1)  Metformin Hcl 1000 Mg Tabs (Metformin Hcl) .... Take 1 Tablet By Mouth Two Times A Day 2)  Lisinopril 20 Mg Tabs (Lisinopril) .... Take 1 Pill By Mouth Daily. 3)   Aspir-Low 81 Mg Tbec (Aspirin) .... One Tablet Daily 4)  Promethazine Hcl 25 Mg Tabs (Promethazine Hcl) .... Take One Tablet Every 6-8 Hours For Nausea. 5)  Percocet 5-325 Mg Tabs (Oxycodone-Acetaminophen) .... Take 1 Tab By Mouth Every 4 Hrs As Needed For Pain. 6)  Ms Contin 15 Mg Xr12h-Tab (Morphine Sulfate) .... Take 1 Tablet By Mouth Two Times A Day. 7)  Ketorolac Tromethamine 30 Mg/ml Soln (Ketorolac Tromethamine) .... One Time Dose in Clinic.  Allergies (verified): 1)  ! Fiorinal (Butalbital-Aspirin-Caffeine) 2)  ! Fioricet (Butalbital-Apap-Caffeine) 3)  ! 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  Past Surgical History: Last updated: 11/10/2006 Hysterectomy for endometriosis when she was 23  Family History: Last updated: 09/23/2008 Sickle  cell in brothers and cousin Grandmother had breast cancer F: prostate CA 66 living M: 86 healthy Sister sickle cell Mother's brother lung CA Mother's sister lung CA  Social History: Last updated: 09/23/2008 Lives alone Works at [employer] as a Merchandiser, retail, on Health visitor about 9hours/day former smoker - quit 2001; had smoked 30 years prior that Alcohol use-no Drug use-yes (reports no today) qui smokting 2000  Risk Factors: Alcohol Use: 0 (10/20/2009) Exercise: yes (10/20/2009)  Risk Factors: Smoking Status: quit (10/20/2009)  Review of Systems      See HPI  Physical Exam  General:  alert and well-developed.     Lungs:  normal respiratory effort and normal breath sounds.   Heart:  normal rate and regular rhythm.   Abdomen:  soft, not tender to palpation but patient complaining of severe pain.  Pulses:  normal peripheral pulses  Extremities:  no cyanosis, clubbing or edema  Neurologic:  non focal.    Impression & Recommendations:  Problem # 1:  PANCREAS DIVISUM (ICD-751.7) We gave one shot of ketorolac for acute pain relief. Percocet refilled. Will follow in two weeks time after surgery.   Problem # 2:  DIABETES MELLITUS, TYPE II (ICD-250.00) NO CHANGES TODAY, DATA REVIEWED: A1c: 6.8 (10/09/2009 3:16:57 PM)  MICROALB/CR: 17.4 (09/12/2008 4:41:00 PM) BP: 121 (12/11/2007 9:42:32 AM) /  73 (12/11/2007 9:42:32 AM) LDL: 103 (06/05/2009 8:46:00 PM) EYE: No diabetic retinopathy.    (08/19/2009 12:29:49 PM) FOOT: yes (05/04/2009 10:48:27 AM) FOOT RISK:   WEIGHT: 21.17 (10/20/2009 1:45:05 PM)   Her updated medication list for this problem includes:    Metformin Hcl 1000 Mg Tabs (Metformin hcl) .Marland Kitchen... Take 1 tablet by mouth two times a day    Lisinopril 20 Mg Tabs (Lisinopril) .Marland Kitchen... Take 1 pill by mouth daily.    Aspir-low 81 Mg Tbec (Aspirin) ..... One tablet daily  Orders: Capillary Blood Glucose/CBG (40102)  Problem # 3:  HYPERTENSION (ICD-401.9) BP noted to be high but patient in significant pain, won't make any changes today.   Her updated medication list for this problem includes:    Lisinopril 20 Mg Tabs (Lisinopril) .Marland Kitchen... Take 1 pill by mouth daily.  Complete Medication List: 1)  Metformin Hcl 1000 Mg Tabs (Metformin hcl) .... Take 1 tablet by mouth two times a day 2)  Lisinopril 20 Mg Tabs (Lisinopril) .... Take 1 pill by mouth daily. 3)  Aspir-low 81 Mg Tbec (Aspirin) .... One tablet daily 4)  Promethazine Hcl 25 Mg Tabs (Promethazine hcl) .... Take one tablet every 6-8 hours for nausea. 5)  Percocet 5-325 Mg Tabs (Oxycodone-acetaminophen) .... Take 1 tab by mouth every 4 hrs as  needed for pain. 6)  Ms Contin 15 Mg Xr12h-tab (Morphine sulfate) .... Take 1 tablet by mouth two times a day. 7)  Ketorolac Tromethamine 30 Mg/ml Soln (Ketorolac tromethamine) .... One time dose in clinic.  Other Orders: Admin of Therapeutic Inj  intramuscular or subcutaneous (72536)  Patient Instructions: 1)  Please schedule a follow-up appointment in 2 weeks. Prescriptions: PERCOCET 5-325 MG TABS (OXYCODONE-ACETAMINOPHEN) Take 1 tab by mouth every 4 hrs as needed for pain.  #15 x 0   Entered and Authorized by:   Zara Council MD   Signed by:   Zara Council MD on 10/20/2009   Method used:   Print then Give to Patient   RxID:   [id number] KETOROLAC TROMETHAMINE 30 MG/ML SOLN (KETOROLAC TROMETHAMINE) one time dose in clinic.  #1 x 0   Entered and  Authorized by:   Zara Council MD   Signed by:   Zara Council MD on 10/20/2009   Method used:   Print then Give to Patient   RxID:   [id number]    Prevention & Chronic Care Immunizations   Influenza vaccine: Fluvax Non-MCR  (01/21/2009)    Tetanus booster: Not documented   Td booster deferral: Deferred  (04/13/2009)    Pneumococcal vaccine: Not documented  Colorectal Screening   Hemoccult: Not documented   Hemoccult action/deferral: Refused  (04/13/2009)    Colonoscopy: Normal  (02/27/2004)   Colonoscopy due: 02/06/2014  Other Screening   Pap smear: Specimen Adequacy: Satisfactory for evaluation.   Interpretation/Result:Negative for intraepithelial Lesion or Malignancy.     (09/23/2008)    Mammogram: Assessment: BIRADS 2.   L breast US and digital bilateral diagnostic mammogram. Scarring changes w/in aubareolar portion of the left breast related to previous excisional biopsy. No evidence for mass and no suspicious microcalcifications. [name] of left axilla dis negative.   [facility].     (11/14/2006)   Mammogram action/deferral: Ordered  (05/04/2009)   Mammogram due:  10/2007   Smoking status: quit  (10/20/2009)  Diabetes Mellitus   HgbA1C: 6.8  (10/09/2009)    Eye exam: No diabetic retinopathy.     (08/19/2009)   Diabetic eye exam action/deferral: Ophthalmology referral  (06/05/2009)   Eye exam due: 08/2010    Foot exam: yes  (05/04/2009)   High risk foot: Not documented   Foot care education: Not documented    Urine microalbumin/creatinine ratio: 17.4  (09/12/2008)    Diabetes flowsheet reviewed?: Yes   Progress toward A1C goal: At goal  Lipids   Total Cholesterol: 214  (06/05/2009)   Lipid panel action/deferral: Lipid Panel ordered   LDL: 103  (06/05/2009)   LDL Direct: Not documented   HDL: 92  (06/05/2009)   Triglycerides: 95  (06/05/2009)    SGOT (AST): 18  (09/07/2009)   BMP action: Ordered   SGPT (ALT): 12  (09/07/2009)   Alkaline phosphatase: 86  (09/07/2009)   Total bilirubin: 0.9  (09/07/2009)    Lipid flowsheet reviewed?: Yes   Progress toward LDL goal: At goal  Hypertension   Last Blood Pressure: 162 / 90  (10/20/2009)   Serum creatinine: 0.71  (09/07/2009)   BMP action: Ordered   Serum potassium 3.7  (09/07/2009)    Hypertension flowsheet reviewed?: Yes   Progress toward BP goal: Improved  Self-Management Support :   Personal Goals (by the next clinic visit) :     Personal A1C goal: 7  (04/13/2009)     Personal blood pressure goal: 140/90  (04/13/2009)     Personal LDL goal: 130  (04/13/2009)    Patient will work on the following items until the next clinic visit to reach self-care goals:     Medications and monitoring: take my medicines every day, bring all of my medications to every visit, examine my feet every day  (10/20/2009)     Eating: drink diet soda or water instead of juice or soda, eat more vegetables, use fresh or frozen vegetables, eat foods that are low in salt, eat baked foods instead of fried foods, eat fruit for snacks and desserts, limit or avoid alcohol  (10/20/2009)     Activity: take a 30  minute walk every day  (10/20/2009)    Diabetes self-management support: Resources for patients handout  (10/20/2009)    Hypertension self-management support: Resources for patients handout  (10/20/2009)  Lipid self-management support: Resources for patients handout  (10/20/2009)        Resource handout printed.    Medication Administration  Injection # 1:    Medication: ketorolac-Toradol 30mg     Diagnosis: ABDOMINAL PAIN (ICD-789.00)    Route: IM    Site: LUOQ gluteus    Exp Date: 07/01/2011    Lot #: [id number]    Mfr: [employer]    Comments: Pain level #8/9 out of 10.    Patient tolerated injection without complications    Given by: Chinita Pester RN (October 20, 2009 2:31 PM)  Orders Added: 1)  Capillary Blood Glucose/CBG [82948] 2)  Admin of Therapeutic Inj  intramuscular or subcutaneous [96372] 3)  Est. Patient Level III [16109]

## 2010-06-01 NOTE — Miscellaneous (Signed)
Summary: Hospital admission  INTERNAL MEDICINE ADMISSION HISTORY AND PHYSICAL  Attending: Dr. Blanch Media First contact: Dr. Logan Bores   [phone] Second contact: Dr. Gwenlyn Perking  [phone] [facility], after-hours: [phone], [phone])  PCP:Dr. Clent Ridges  CC: Abdominal pain and diarrhea  HPI: 55 yo female with PMH most significant for HTN, DM II, h/o CVA and acute pancreatitis(cause unknown in 1990's) presented with chief c/o abdominal pain for 1 week and loose motions for last 5 days. The abdominla pain started suddenly at night 1 week ago and she woke up from her sleep, it was 12/10, sharp cramping type of pain in her left side of abdomen radiating to her back, no aggravating factors and relieved by pain meds. The pain was persistent and gradually progressive. The pain was a/w nausea but no vomiting. She does mention that pain was a/w increased freq of urination along with burning and hematuria but blood was not found on her UA in ED. She also c/o loose bowel movements since last 5 days which are not a/w abdominal pain. The BM are initiated everytime she eats something and usually had 3-4 epiosdes per day. It is described as loose, non watery, non bloody stools. She was str=arted on Imodium when she was seen in the clinic last week and she says that she is much better after the meds. No h/o fever, chills, chest pain, SOB, palpitations, weight loss, loss of appetite or weakness.   ALLERGIES: ! FIORINAL [facility]) ! FIORICET [facility]) ! SULFA   PAST MEDICAL HISTORY: Diabetes mellitus, type II ( HGBA1C: 7.1 (04/2009 1:35:54 PM) ) Hyperlipidemia TG 100 Hypertension Pulmonary embolism, hx of, approx 2006, was on coumadin for 1 year Migraine headaches Sickle-cell trait (should be considered a benign carrier condition) She states she had a CVA in 1993 with a prolonged rehab course, eventually resolving her right sided weakness, as per her history Hx of pancreatitis in  1990's beta-thallasemia   MEDICATIONS: METFORMIN HCL 1000 MG TABS (METFORMIN HCL) Take 1 tablet by mouth two times a day PRINIVIL 10 MG TABS (LISINOPRIL) one tablet by mouth daily ASPIR-LOW 81 MG TBEC (ASPIRIN) One tablet daily ULTRAM 50 MG TABS (TRAMADOL HCL) Take one tablet two times a day for pain. PROMETHAZINE HCL 25 MG TABS (PROMETHAZINE HCL) Take one tablet every 6-8 hours for nausea.   SOCIAL HISTORY: Lives alone Works at [employer] as a Merchandiser, retail, on Health visitor about 9hours/day former smoker - quit 2001; had smoked 1/2 PPD for 30 years prior that Alcohol use-no Drug use-yes (reports no today) qui smokting 2000   FAMILY HISTORY Sickle cell in brothers and cousin Grandmother had breast cancer F: prostate CA 97 living M: 60 healthy Sister sickle cell disease Mother's brother lung CA Mother's sister lung CA   ROS:See HPI  VITALS: T: 98.1 P:  82 BP: 192/88>>171/84  R:16  O2SAT:98%  ON:RA  PHYSICAL EXAM: GEN: NAD HEAD: NCAT,  EYES: PERRL, EOMI, no icterus, no pallor NECK: supple, no JVD, no cervical LN LUNGS: clear to auscultation bilaterally.  CVS: RRR, distant heart sounds, no murmur/rubs/gallops ABD: Soft, non-tender, normal bowel sounds. EXTREMITIES: No edema or cyanosis NEURO: Alert &Oriented x3, no focal motor or sensory deficits.    LABS: CBC+Diff WBC                                      7.6  4.0-10.5         K/uL  RBC                                      4.67              3.87-5.11        MIL/uL  Hemoglobin (HGB)                         12.8              12.0-15.0        g/dL  Hematocrit (HCT)                         37.9              36.0-46.0        %  MCV                                      81.1              78.0-100.0       fL  MCHC                                     33.8              30.0-36.0        g/dL  RDW                                      14.4              11.5-15.5        %  Platelet Count (PLT)                     310                150-400          K/uL  Neutrophils, %                           59                43-77            %  Lymphocytes, %                           37                12-46            %  Monocytes, %                             2          l      3-12             %  Eosinophils, %  1                 0-5              %  Basophils, %                             1                 0-1              %  Neutrophils, Absolute                    4.5               1.7-7.7          K/uL  Lymphocytes, Absolute                    2.8               0.7-4.0          K/uL  Monocytes, Absolute                      0.1               0.1-1.0          K/uL  Eosinophils, Absolute                    0.1               0.0-0.7          K/uL  Basophils, Absolute                      0.1               0.0-0.1          K/uL  CMET: Sodium (NA)                              137               135-145          mEq/L  Potassium (K)                            3.5               3.5-5.1          mEq/L  Chloride                                 102               96-112           mEq/L  CO2                                      27                19-32            mEq/L  Glucose  77                70-99            mg/dL  BUN                                      7                 6-23             mg/dL  Creatinine                               0.60              0.4-1.2          mg/dL  GFR, Est Non African American            >60               >60              mL/min  GFR, Est African American                >60               >60              mL/min    Oversized comment, see footnote  1  Bilirubin, Total                         0.8               0.3-1.2          mg/dL  Alkaline Phosphatase                     72                39-117           U/L  SGOT (AST)                               19                0-37             U/L  SGPT (ALT)                               11                 0-35             U/L  Total  Protein                           7.3               6.0-8.3          g/dL  Albumin-Blood                            4.0               3.5-5.2  g/dL  Calcium                                  9.2               8.4-10.5         mg/dL  Lipase                                   197        h      11-59            U/L  UA Color, Urine                             YELLOW            YELLOW  Appearance                               CLEAR             CLEAR  Specific Gravity                         1.003      l      1.005-1.030  pH                                       6.0               5.0-8.0  Urine Glucose                            NEGATIVE          NEG              mg/dL  Bilirubin                                NEGATIVE          NEG  Ketones                                  NEGATIVE          NEG              mg/dL  Blood                                    NEGATIVE          NEG  Protein                                  NEGATIVE          NEG              mg/dL  Urobilinogen  0.2               0.0-1.0          mg/dL  Nitrite                                  NEGATIVE          NEG  Leukocytes                               NEGATIVE          NEG    MICROSCOPIC NOT DONE ON URINES WITH NEGATIVE PROTEIN, BLOOD, LEUKOCYTES,    NITRITE, OR GLUCOSE <1000 mg/dL.  CT ABDOMEN AND PELVIS WITH CONTRAST    Technique:  Multidetector CT imaging of the abdomen and pelvis was   performed following the standard protocol during bolus   administration of intravenous contrast.    Contrast: 80 ml Omnipaque-300 IV    Comparison: CT 09/07/2009.    Findings: No acute abdominal findings.  No acute urinary tract   obstruction.  No renal calculi.  No free fluid.  No unusual bowel   wall thickening.  Negative liver, spleen, kidneys, and adrenal   glands.  Dilatation of the pancreatic duct this was also   appreciated on the 05/25/2009 abdominal  ultrasound exam.  Normal   bladder.  Hysterectomy.  No pelvic mass.  Bilateral buttock   subcutaneous scarring and calcified injection granulomata.   Generous amount of stool is present in the colon.  No significant   bony abnormality.    IMPRESSION:   No acute abdominal or pelvic findings.  Pancreatic ductal   dilatation as noted previously.  Negative for acute pancreatitis or   mass.   CHEST - 2 VIEW    Comparison: Chest CT 04/14/2009.  Chest radiographs 04/13/2009.    Findings: The heart size and mediastinal contours are stable.   There is stable linear scarring in the lingula.  The lungs are   otherwise clear.  There is no pleural effusion or pneumothorax.   Prominent nipple shadows are noted bilaterally.    IMPRESSION:   Stable examination.  No active cardiopulmonary process.    ASSESSMENT AND PLAN: (1) Acute Pancreatitis: The patient is having a raised lipase level along with some dilatation of pancreatic duct (seen on previous CT scan from Jan as well). Soem of the well known causes of pancreatitis are Gall stones, alcohol, hyperlipidemia, viral infections, trauma or drugs. Patient does not have hyperlipidemia as last LDL was 84, HDL 92 TC 196 and TG 100 from Dec 2010. CT scan is negative for Gall stone disease and also presentation(H and P) is not typical of gall stone disease. No h/o trauma or alcohol use. Patient does take lisinopril which has been a/w pancreatitis. We will stop her lisinopril and also admit her to regular bed with NPO, IVF and pain meds for conservative management.  (2) Diarrhea: The cause of this is unknown, FOBT is negative. We will check stool for leucocytes, ova and parasites along with c diff. Tramadol, metformin and lisinopril may cause diarrhea as well.  (3) Freaquent urination with hematuria: Patient has been c/o hematuria for some time now but the urine has been clear everytime.At least 5 UA neg from clinic chart in last 1 year. She had a UTI in may  2010 when she had 3-6 RBC's  on U microscopy. We may follow up as Out patinet with a Gyn appointment becuase of h/o cervical cancer.  (4)DM:  Check A1c, will cover with SSI.  (5)HTN: patient Bp is high in 190's and most recent one in 177/84. We will stop her lisinopril but start her on Losartan.  (6) Hx of CVA: continue aspirin  (7)VTE PROPH: lovenox

## 2010-06-01 NOTE — Assessment & Plan Note (Signed)
Summary: ED f/u, review meds/pcp-ka-reynolds/hla   Vital Signs:  Patient profile:   55 year old female Height:      61.5 inches Weight:      117.0 pounds BMI:     21.83 Temp:     99.4 degrees F oral Pulse rate:   92 / minute BP sitting:   168 / 87  (right arm)  Vitals Entered By: Filomena Jungling NT II (February 08, 2010 3:19 PM) Is Patient Diabetic? No Pain Assessment Patient in pain? no      Nutritional Status BMI of 19 -24 = normal CBG Result 127  Have you ever been in a relationship where you felt threatened, hurt or afraid?No   Does patient need assistance? Functional Status Self care Ambulation Normal   Primary Care Provider:  Johnette Abraham DO   History of Present Illness: [name] with pancreatic divisum who is followed at [facility] comes to the clinic complainig of pain in groin and bot legs.   this has been going on since 2 days. prob list says she had it for yrs  aching pain in groin and legs no aggravating on releiving factor not aggravated by walking no n/v/fever,chills no other complaints. says its different from her pancreattis pain   Current Medications (verified): 1)  Metformin Hcl 1000 Mg Tabs (Metformin Hcl) .... Take 1 Tablet By Mouth Two Times A Day 2)  Lisinopril 20 Mg Tabs (Lisinopril) .... Take 1 Pill By Mouth Daily. 3)  Aspir-Low 81 Mg Tbec (Aspirin) .... One Tablet Daily  Allergies (verified): 1)  ! Fiorinal (Butalbital-Aspirin-Caffeine) 2)  ! Fioricet (Butalbital-Apap-Caffeine) 3)  ! Sulfa  Review of Systems  The patient denies anorexia, fever, weight loss, weight gain, vision loss, decreased hearing, hoarseness, chest pain, syncope, dyspnea on exertion, peripheral edema, prolonged cough, headaches, hemoptysis, abdominal pain, melena, hematochezia, severe indigestion/heartburn, hematuria, incontinence, genital sores, muscle weakness, suspicious skin lesions, transient blindness, difficulty walking, depression, unusual weight change,  abnormal bleeding, enlarged lymph nodes, angioedema, breast masses, and testicular masses.    Physical Exam  General:  alert, well-developed, and well-nourished.   Head:  normocephalic and atraumatic.   Eyes:  vision grossly intact, pupils equal, and pupils round.   Ears:  R ear normal and L ear normal.   Nose:  no external deformity.   Neck:  supple and full ROM.   Chest Wall:  no deformities and no tenderness.   Lungs:  normal respiratory effort, no intercostal retractions, no accessory muscle use, normal breath sounds, and no dullness.   Heart:  normal rate, regular rhythm, no murmur, and no gallops.   Abdomen:  soft, non-tender, normal bowel sounds, no distention, and no masses.   Rectal:  refused Genitalia:  refused   Impression & Recommendations:  Problem # 1:  PELVIC PAIN, ACUTE (ICD-789.09)  This has been a problem for her before as well she was seen for exactly the same complaint in 2009 at which time she was asked to follow up with Gyn she has not been seen by Gyn before but there is a problem of pelvic floor laxity in her list which was as per patinet because she had a prolapse. I am  not sure about the details of this as she had a hysterectomy at age 43 yrs for uterine cancer, so I assume she had her cervix removed at that time. In any event, may be she is exhibiting narcotic seeking behavior given multiple different presentations for pain in the clinic and  ER and asking for narcotics. Will refer her to Gyn and presribe tylenol with codiene. UA has been negative multiple times before and she does not have urinary complaints. will not get this time. she refuses pelvic exam to me.   The following medications were removed from the medication list:    Hydrocodone-acetaminophen 5-500 Mg Tabs (Hydrocodone-acetaminophen) .Marland Kitchen... Take 1 tablet by mouth four times a day Her updated medication list for this problem includes:    Aspir-low 81 Mg Tbec (Aspirin) ..... One tablet daily     Acetaminophen-codeine #2 300-15 Mg Tabs (Acetaminophen-codeine) .Marland Kitchen... 1 tab every 6 hrs as needed for a pain  Orders: Gynecologic Referral (Gyn)  The following medications were removed from the medication list:    Hydrocodone-acetaminophen 5-500 Mg Tabs (Hydrocodone-acetaminophen) .Marland Kitchen... Take 1 tablet by mouth four times a day Her updated medication list for this problem includes:    Aspir-low 81 Mg Tbec (Aspirin) ..... One tablet daily    Acetaminophen-codeine #2 300-15 Mg Tabs (Acetaminophen-codeine) .Marland Kitchen... 1 tab every 6 hrs as needed for a pain  Complete Medication List: 1)  Metformin Hcl 1000 Mg Tabs (Metformin hcl) .... Take 1 tablet by mouth two times a day 2)  Lisinopril 20 Mg Tabs (Lisinopril) .... Take 1 pill by mouth daily. 3)  Aspir-low 81 Mg Tbec (Aspirin) .... One tablet daily 4)  Acetaminophen-codeine #2 300-15 Mg Tabs (Acetaminophen-codeine) .Marland Kitchen.. 1 tab every 6 hrs as needed for a pain  Other Orders: T- Capillary Blood Glucose ([id number]) T-Hgb A1C (in-house) ([id number])  Patient Instructions: 1)  Please schedule a follow-up appointment in 1 month. Prescriptions: ACETAMINOPHEN-CODEINE #2 300-15 MG TABS (ACETAMINOPHEN-CODEINE) 1 tab every 6 hrs as needed for a pain  #15 x 0   Entered and Authorized by:   Bethel Born MD   Signed by:   Bethel Born MD on 02/08/2010   Method used:   Print then Give to Patient   RxID:   [id number]   Prevention & Chronic Care Immunizations   Influenza vaccine: Fluvax Non-MCR  (01/21/2009)   Influenza vaccine deferral: Deferred  (11/23/2009)    Tetanus booster: Not documented   Td booster deferral: Deferred  (11/23/2009)    Pneumococcal vaccine: Not documented  Colorectal Screening   Hemoccult: Not documented   Hemoccult action/deferral: Refused  (04/13/2009)    Colonoscopy: Normal  (02/27/2004)   Colonoscopy due: 02/06/2014  Other Screening   Pap smear: Specimen Adequacy: Satisfactory for evaluation.    Interpretation/Result:Negative for intraepithelial Lesion or Malignancy.     (09/23/2008)   Pap smear action/deferral: Deferred  (11/23/2009)    Mammogram: Assessment: BIRADS 2.   L breast US and digital bilateral diagnostic mammogram. Scarring changes Munoz/in aubareolar portion of the left breast related to previous excisional biopsy. No evidence for mass and no suspicious microcalcifications. [name] of left axilla dis negative.   Location: [facility].     (11/14/2006)   Mammogram action/deferral: Deferred  (11/23/2009)   Mammogram due: 10/2007   Smoking status: quit  (11/23/2009)  Diabetes Mellitus   HgbA1C: 6.5  (02/08/2010)    Eye exam: No diabetic retinopathy.     (08/19/2009)   Diabetic eye exam action/deferral: Ophthalmology referral  (06/05/2009)   Eye exam due: 08/2010    Foot exam: yes  (05/04/2009)   High risk foot: Not documented   Foot care education: Not documented    Urine microalbumin/creatinine ratio: 17.4  (09/12/2008)  Lipids   Total Cholesterol: 214  (06/05/2009)   Lipid panel action/deferral:  Lipid Panel ordered   LDL: 103  (06/05/2009)   LDL Direct: Not documented   HDL: 92  (06/05/2009)   Triglycerides: 95  (06/05/2009)    SGOT (AST): 18  (09/07/2009)   BMP action: Ordered   SGPT (ALT): 12  (09/07/2009)   Alkaline phosphatase: 86  (09/07/2009)   Total bilirubin: 0.9  (09/07/2009)  Hypertension   Last Blood Pressure: 168 / 87  (02/08/2010)   Serum creatinine: 0.68  (11/23/2009)   BMP action: Ordered   Serum potassium 3.6  (11/23/2009)  Self-Management Support :   Personal Goals (by the next clinic visit) :     Personal A1C goal: 7  (04/13/2009)     Personal blood pressure goal: 140/90  (04/13/2009)     Personal LDL goal: 130  (04/13/2009)    Diabetes self-management support: Resources for patients handout  (11/05/2009)    Hypertension self-management support: Resources for patients handout  (11/05/2009)    Lipid  self-management support: Resources for patients handout  (11/05/2009)      Laboratory Results   Blood Tests   Date/Time Received: February 08, 2010 3:36 PM Date/Time Reported: Alric Quan  February 08, 2010 3:36 PM   HGBA1C: 6.5%   (Normal Range: Non-Diabetic - 3-6%   Control Diabetic - 6-8%) CBG Random:: 127mg /dL

## 2010-06-01 NOTE — Progress Notes (Signed)
Summary: phone/gg   walsh  Phone Note Call from Patient   Summary of Call: Pt states she went to ED yesterday.  Dx with gastritis. She had CT and wants to know if she can restart metformin. she also wants tylenol with codeine for her abd pain.   Pt # [id number] Initial call taken by: Merrie Roof RN,  May 22, 2009 4:17 PM  Follow-up for Phone Call        Talked with Dr Clent Ridges and pt  can restart metformin tomorrow.  Pt to drink plenty of fluids. Pain med denied Pt informed Follow-up by: Merrie Roof RN,  May 22, 2009 5:11 PM

## 2010-06-01 NOTE — Letter (Signed)
Summary: *Referral Letter [facility])  [facility]  [address]   [place], [place] [address]   Phone: [phone]  Fax: [phone]    10/02/2009  Thank you in advance for agreeing to see my patient:  [name] [address] [place], [place]  [address]  Phone: [phone]  Reason for Referral: Symptomatic pancreatic divisum  Pt was hospitalized 5/13 - 09/19/09 for above.  Lipase on admission was 197.  CT abd and pevis showed dilated pancreatic duct.  Dr Leone Payor, GI, was consulted and felt pt had pancreatic divisum.  We treated the pt symptomatically and she slowly improved and was discharged.  Dr Leone Payor felt she would benefit from evaluation for ERCP and possible stent placement and recommended sending the pt as an oupt to [facility].  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 5)  PERCOCET 5-325 MG TABS (OXYCODONE-ACETAMINOPHEN) Take 1 tab by mouth every 4 hrs as needed for pain. 6)  MS CONTIN 15 MG XR12H-TAB (MORPHINE SULFATE) Take 1 tablet by mouth two times a day.  Please send (mail / fax) all correspondence pertaining to this referral to:  ATTN:    Dr Elby Showers   [facility]   [address]   [place], [place] [address]    Fax: [phone]  Thank you again for agreeing to see our patient; please contact us if you have any further questions or need additional information.  Sincerely,  Blanch Media MD

## 2010-06-01 NOTE — Letter (Signed)
Summary: DIABETIC EYE EXAMINATION REPORT  DIABETIC EYE EXAMINATION REPORT   Imported By: Margie Billet 09/14/2009 10:13:43  _____________________________________________________________________  External Attachment:    Type:   Image     Comment:   External Document  Appended Document: DIABETIC EYE EXAMINATION REPORT    Clinical Lists Changes  Observations: Added new observation of [id number]: 08/2010 (09/14/2009 12:28) Added new observation of DIAB EYE EX: No diabetic retinopathy.    (08/19/2009 12:29)       Diabetic Eye Exam  Procedure date:  08/19/2009  Findings:      No diabetic retinopathy.     Procedures Next Due Date:    Diabetic Eye Exam: 08/2010   Diabetic Eye Exam  Procedure date:  08/19/2009  Findings:      No diabetic retinopathy.     Procedures Next Due Date:    Diabetic Eye Exam: 08/2010

## 2010-06-01 NOTE — Assessment & Plan Note (Signed)
Summary: RA/2 WEEK RECHECK PER PT/CH   Vital Signs:  Patient profile:   55 year old female Height:      61.5 inches (156.21 cm) Weight:      112.2 pounds (51 kg) BMI:     20.93 Temp:     97.9 degrees F (36.61 degrees C) Pulse rate:   82 / minute BP sitting:   148 / 77  (left arm) Cuff size:   regular  Vitals Entered By: Theotis Barrio NT II (November 05, 2009 3:04 PM) CC: FOLLOW UP APPT / HAD SURGERY/   ROUTEN OFFICE VISIT, Abdominal Pain, Hypertension Management, Lipid Management Is Patient Diabetic? No Pain Assessment Patient in pain? no      Nutritional Status BMI of 19 -24 = normal  Have you ever been in a relationship where you felt threatened, hurt or afraid?No   Does patient need assistance? Functional Status Self care Ambulation Normal Comments FOLLOW UPAPPT / HAD SURGERY  / ROUTINE OFFICE VISIT.   Primary Care Provider:  Elby Showers MD  CC:  FOLLOW UP APPT / HAD SURGERY/   ROUTEN OFFICE VISIT, Abdominal Pain, Hypertension Management, and Lipid Management.  History of Present Illness: Pt is a 55yo Female with a PMHx of Pancreatic divisum and recent episode of acute pancreatitis in May 2011 requiring 9-day hospitalization, s/p ERCP with stent placement at [facility] on October 21, 2009.  Pt here today for hospital follow-up. Pt indicates that since procedure, her severe abdominal and back pain and nausea, have been progressively improving, with significant improvement. However, since procedure she is having intermittent foul-smelling diarrhea, every other day, 3-4 times daily. No recent Abx, contacts with similar symptoms. No specific food association. Previously thought to be related to Metformin usage, however, since dc in May 2011. Also, experiencing early satiety, where if she tries to eat more, gets almost a burning sensation epigastric region. Also with nausea. Denies vomiting, severe abdominal pain, black tarry stools. No fevers, chills.  During admission, pt's lisinopril  was increased to 20mg  daily.  Expected to follow-up with GI at [facility] next week.  Pt also complains of thalassemia pain, which started today, and in past has usually lasted for a few days. Took Tylenol 3 x 325mg  tabs today, without relief of the symptoms. From records it seems that pt has been previously tried on several narcotic medications for headaches and pain. Last using Darvocet with adequate relief, but It was recalled.  Last in ER for pain in 04/2009.   Dyspepsia History:      There is a prior history of GERD.  The patient does not have a prior history of documented ulcer disease.  The dominant symptom is not heartburn or acid reflux.    Hypertension History:      Positive major cardiovascular risk factors include female age 19 years old or older, diabetes, hyperlipidemia, and hypertension.  Negative major cardiovascular risk factors include non-tobacco-user status.    Lipid Management History:      Positive NCEP/ATP III risk factors include female age 107 years old or older, diabetes, and hypertension.  Negative NCEP/ATP III risk factors include HDL cholesterol greater than 60 and non-tobacco-user status.              Habits & Providers  Alcohol-Tobacco-Diet     Alcohol drinks/day: 0     Tobacco Status: quit     Year Quit: 2000     Pack years: 30  Exercise-Depression-Behavior     Does Patient  Exercise: yes     Type of exercise: FLOOR EXERCISE     Times/week: 2     Drug Use: yes     Seat Belt Use: 100  Current Medications (verified): 1)  Metformin Hcl 1000 Mg Tabs (Metformin Hcl) .... Take 1 Tablet By Mouth Two Times A Day 2)  Lisinopril 20 Mg Tabs (Lisinopril) .... Take 1 Pill By Mouth Daily. 3)  Aspir-Low 81 Mg Tbec (Aspirin) .... One Tablet Daily 4)  Promethazine Hcl 25 Mg Tabs (Promethazine Hcl) .... Take One Tablet Every 6-8 Hours For Nausea. 5)  Tramadol Hcl 50 Mg Tabs (Tramadol Hcl) .Marland Kitchen.. 1-2 Pills 4 Times A Day As Needed  Allergies: 1)  ! Fiorinal  (Butalbital-Aspirin-Caffeine) 2)  ! Fioricet (Butalbital-Apap-Caffeine) 3)  ! Sulfa  Review of Systems       The patient complains of abdominal pain.  The patient denies anorexia, fever, hematochezia, and severe indigestion/heartburn.    Physical Exam  General:  alert, well-developed, well-nourished, and well-hydrated.   Head:  normocephalic and atraumatic.   Neck:  supple.   Chest Wall:  no deformities and no tenderness.   Lungs:  normal respiratory effort, no intercostal retractions, no accessory muscle use, and normal breath sounds.   Heart:  normal rate, regular rhythm, no murmur, no gallop, and no rub.   Abdomen:  soft, no guarding, no rigidity, no rebound tenderness, no abdominal hernia, no hepatomegaly, no splenomegaly, bowel sounds hyperactive, and LUQ tenderness.   Msk:  normal ROM, no joint warmth, no redness over joints, no joint deformities, and joint tenderness.     Impression & Recommendations:  Problem # 1:  PANCREAS DIVISUM (ICD-751.7) Assessment Improved S/p ERCP with stenting on 10/21/09. No known complications during procedure and pt notes significant improvement of  abdominal/back pain.  But with persistent diarrhea since procedure. Likely secondary h/o to pancreatic insufficiency, with perhaps a need for pancrease enzyme supplementation.  Pt to follow-up with Surgery next week.  Ok to use OTC Imodium for relief of diarrhea.  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 She has been on numerous narcotic pain medications, and if possible, other options will be explored first before restarting narcotics.  - Will try Tramadol for pain control - If pain does not improve, consider Vicodin   Problem # 3:  HYPERTENSION (ICD-401.9) Pt's medication was increased while inpatient, secondary to elevated BPs. Today, blood pressure is also elevated. However, pt is currently in pain. Therefore, may be  elevated secondary to pain.  - Will reassess at next visit, with possible increase in lisinopril or consider adding HCTZ.   Her updated medication list for this problem includes:    Lisinopril 20 Mg Tabs (Lisinopril) .Marland Kitchen... Take 1 pill by mouth daily.  Problem # 4:  Preventive Health Care (ICD-V70.0) Will be addressed at next office visit.  Complete Medication List: 1)  Metformin Hcl 1000 Mg Tabs (Metformin hcl) .... Take 1 tablet by mouth two times a day 2)  Lisinopril 20 Mg Tabs (Lisinopril) .... Take 1 pill by mouth daily. 3)  Aspir-low 81 Mg Tbec (Aspirin) .... One tablet daily 4)  Promethazine Hcl 25 Mg Tabs (Promethazine hcl) .... Take one tablet every 6-8 hours for nausea. 5)  Tramadol Hcl 50 Mg Tabs (Tramadol hcl) .Marland Kitchen.. 1-2 pills 4 times a day as needed  Hypertension Assessment/Plan:      The patient's hypertensive risk group is category C: Target organ damage  and/or diabetes.  Her calculated 10 year risk of coronary heart disease is 15 %.  Today's blood pressure is 148/77.  Her blood pressure goal is < 130/80.  Lipid Assessment/Plan:      Based on NCEP/ATP III, the patient's risk factor category is "history of diabetes".  The patient's lipid goals are as follows: Total cholesterol goal is 200; LDL cholesterol goal is 100; HDL cholesterol goal is 40; Triglyceride goal is 150.    Patient Instructions: 1)  Pt to follow-up in 1 month to address health maintenance as well as to assess state of current problems. Preferably after evaluation by GI.  2)  Pt recommended to follow-up with GI as planned and to discuss concerns about diarrhea/ nausea for possible evaluation of need for pancrease enzyme supplementation or other recommendations. 3)  If pt develops fevers, blood diarrhea, severe abdominal pain, please call clinic or report to ER. 4)    Prescriptions: TRAMADOL HCL 50 MG TABS (TRAMADOL HCL) 1-2 pills 4 times a day as needed  #60 x 2   Entered and Authorized by:   Johnette Abraham DO   Signed by:   Johnette Abraham DO on 11/05/2009   Method used:   Electronically to        [id number] (retail)       [address] [place], [place]  [address]       Ph: [phone]       Fax: [phone]   RxID:   [id number] PROMETHAZINE HCL 25 MG TABS (PROMETHAZINE HCL) Take one tablet every 6-8 hours for nausea.  #30 x 1   Entered and Authorized by:   Johnette Abraham DO   Signed by:   Johnette Abraham DO on 11/05/2009   Method used:   Electronically to        [id number]* (retail)       [address] [place], [place]  [address]       Ph: [phone]       Fax: [phone]   RxID:   [id number]    Prevention & Chronic Care Immunizations   Influenza vaccine: Fluvax Non-MCR  (01/21/2009)    Tetanus booster: Not documented   Td booster deferral: Deferred  (04/13/2009)    Pneumococcal vaccine: Not documented  Colorectal Screening   Hemoccult: Not documented   Hemoccult action/deferral: Refused  (04/13/2009)    Colonoscopy: Normal  (02/27/2004)   Colonoscopy due: 02/06/2014  Other Screening   Pap smear: Specimen Adequacy: Satisfactory for evaluation.   Interpretation/Result:Negative for intraepithelial Lesion or Malignancy.     (09/23/2008)    Mammogram: Assessment: BIRADS 2.   L breast US and digital bilateral diagnostic mammogram. Scarring changes w/in aubareolar portion of the left breast related to previous excisional biopsy. No evidence for mass and no suspicious microcalcifications. [name] of left axilla dis negative.   Location: [facility].     (11/14/2006)   Mammogram action/deferral: Ordered  (05/04/2009)   Mammogram due: 10/2007   Smoking status: quit  (11/05/2009)  Diabetes Mellitus   HgbA1C: 6.8  (10/09/2009)    Eye exam: No diabetic retinopathy.     (08/19/2009)   Diabetic eye exam action/deferral: Ophthalmology referral  (06/05/2009)    Eye exam due: 08/2010    Foot exam: yes  (05/04/2009)   High risk foot: Not documented   Foot care education: Not documented  Urine microalbumin/creatinine ratio: 17.4  (09/12/2008)  Lipids   Total Cholesterol: 214  (06/05/2009)   Lipid panel action/deferral: Lipid Panel ordered   LDL: 103  (06/05/2009)   LDL Direct: Not documented   HDL: 92  (06/05/2009)   Triglycerides: 95  (06/05/2009)    SGOT (AST): 18  (09/07/2009)   BMP action: Ordered   SGPT (ALT): 12  (09/07/2009)   Alkaline phosphatase: 86  (09/07/2009)   Total bilirubin: 0.9  (09/07/2009)  Hypertension   Last Blood Pressure: 148 / 77  (11/05/2009)   Serum creatinine: 0.71  (09/07/2009)   BMP action: Ordered   Serum potassium 3.7  (09/07/2009)  Self-Management Support :   Personal Goals (by the next clinic visit) :     Personal A1C goal: 7  (04/13/2009)     Personal blood pressure goal: 140/90  (04/13/2009)     Personal LDL goal: 130  (04/13/2009)    Patient will work on the following items until the next clinic visit to reach self-care goals:     Medications and monitoring: take my medicines every day, bring all of my medications to every visit  (11/05/2009)     Eating: drink diet soda or water instead of juice or soda, eat more vegetables, use fresh or frozen vegetables, eat foods that are low in salt, eat baked foods instead of fried foods, eat fruit for snacks and desserts, limit or avoid alcohol  (11/05/2009)     Activity: take a 30 minute walk every day  (11/05/2009)    Diabetes self-management support: Resources for patients handout  (11/05/2009)    Hypertension self-management support: Resources for patients handout  (11/05/2009)    Lipid self-management support: Resources for patients handout  (11/05/2009)        Resource handout printed.

## 2010-06-01 NOTE — Assessment & Plan Note (Signed)
Summary: needs pain med/pcp-[name]/hla   Vital Signs:  Patient profile:   55 year old female Height:      61.5 inches (156.21 cm) Weight:      119.5 pounds (54.32 kg) BMI:     22.29 Temp:     98.6 degrees F (37.00 degrees C) oral Pulse rate:   92 / minute BP sitting:   147 / 85  (right arm) Cuff size:   regular  Vitals Entered By: Krystal Eaton Duncan Dull) (June 05, 2009 1:41 PM) CC: was seen in ER and told to see pcp for gi referral for colonoscopy--Darvocet was discontinued and she wishes to get rx for pain, refill on bp meds Is Patient Diabetic? Yes Did you bring your meter with you today? No Pain Assessment Patient in pain? yes     Location: "joints ache" legs/elbows Intensity: 8 Type: aching Onset of pain  Intermittent for sometime Nutritional Status BMI of 19 -24 = normal  Have you ever been in a relationship where you felt threatened, hurt or afraid?No   Does patient need assistance? Functional Status Self care Ambulation Normal da  Primary Care Provider:  Elby Showers MD  CC:  was seen in ER and told to see pcp for gi referral for colonoscopy--Darvocet was discontinued and she wishes to get rx for pain and refill on bp meds.  History of Present Illness: This is a 55 year old woman with past medical history outline in chart.  She is here today to discuss pain medication.  She had been on darvocet in the past and this has now been taken off the market.    1) she had an episode of gastritis. Went to the ED and was told that there was something worrysome on CT scan (gastritis) and that she should have a colonoscopy for further work up.  She had a normal colonoscopy about 5 years.  She has recovered from the gastritis and is better now.  2) she has joint pain which she attributes to thalassemia.  full work up has been done and has been negative for other cause. has pain in her knees and elbows.  She has "flares" where her stomach and groin also are involved.  Has  not been to a hematologist in a "LONG" time, has never seen one in [place].  She has been on hydroxyurea for about 2 months in the past and she felt really good then, she was taken off for unknown reason and since then she has had joint pains and not felt well.  3) she is still waiting for information on her mammogram scholarship.   Depression History:      The patient denies a depressed mood most of the day and a diminished interest in her usual daily activities.         Current Medications (verified): 1)  Metformin Hcl 1000 Mg Tabs (Metformin Hcl) .... Take 1 Tablet By Mouth Two Times A Day 2)  Prinivil 10 Mg Tabs (Lisinopril) .... One Tablet By Mouth Daily 3)  Aspir-Low 81 Mg Tbec (Aspirin) .... One Tablet Daily  Allergies: 1)  ! Fiorinal (Butalbital-Aspirin-Caffeine) 2)  ! Fioricet (Butalbital-Apap-Caffeine) 3)  ! Sulfa  Review of Systems       per hpi  Physical Exam  General:  alert and well-developed.   Lungs:  normal respiratory effort and normal breath sounds.   Heart:  normal rate, regular rhythm, and no murmur.   Abdomen:  soft, non-tender, normal bowel sounds, no distention,  and no masses.   Msk:  there is no swelling, instability, warmth or deformity on knees or elbows.  examination does not elicit pain.  strength is 5/5 throughout and she moves easily to exam table and down the hall. Pulses:  2+ Extremities:  no edema Neurologic:  alert & oriented X3, cranial nerves II-XII intact, strength normal in all extremities, sensation intact to light touch, and gait normal.   Psych:  Oriented X3, memory intact for recent and remote, good eye contact, slightly anxious, and agitated.     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  Her CBCs here are normal.  not anemic and MCV is normal.  Not sure what kind of thalassemia this is.  There is note in this chart of electrophoresis in 2008  showing either [place] trait (she does have some family members with  disease) or Thalassemia B.  Problem # 2:  PAIN IN JOINT, MULTIPLE SITES (ICD-719.49)  She has been told that this is related to thalassemia.  She describes pain in knees mainly, sometimes elbow and during "flares" has pain in groin and abdomen.  She reports that her knees are killing her today, though exam did not elicit any discomfort and was totally normal.  Gait is normal.  She reports that tylenol and ibuprofen do not work at all for her and became very angry when I did not offer her anything else for pain.  She used to take darvocet and she wants something similar.  I explained to her that I am uncomfortable treating this without a diagnosis.  I have ordered xrays of both knees, and will try to get records to clarify diagnosis of thalassemia.  Prior work up has included ANA negative twice and weak positive once, RF negative.  Of note, she was here a few weeks ago and she and I had a similar conversation about migrain headache.  She reported severe migrain bue refused to accept any therapy other than pain medication.  She said that she only gets migrain every 6 months, so at that time I gave her 10 vicodin tablets.  Orders: Diagnostic X-Ray/Fluoroscopy (Diagnostic X-Ray/Flu)  Problem # 3:  GERD (ICD-530.81) No current symptoms of GERD.  Problem # 4:  DIABETES MELLITUS, TYPE II (ICD-250.00) A1C has been creeping upwards, at last check was 7.7.  She will attempt to walk daily for exercise.  Will refer to optho today.  Her updated medication list for this problem includes:    Metformin Hcl 1000 Mg Tabs (Metformin hcl) .Marland Kitchen... Take 1 tablet by mouth two times a day    Prinivil 10 Mg Tabs (Lisinopril) ..... One tablet by mouth daily    Aspir-low 81 Mg Tbec (Aspirin) ..... One tablet daily  Orders: Ophthalmology Referral (Ophthalmology)  Labs Reviewed: Creat: 0.73 (01/21/2009)    Reviewed HgBA1c results: 7.7 (04/20/2009)  6.3  (01/21/2009)  Problem # 5:  HYPERTENSION (ICD-401.9) BP still slightly elevated.  Her updated medication list for this problem includes:    Prinivil 10 Mg Tabs (Lisinopril) ..... One tablet by mouth daily  BP today: 147/85 Prior BP: 141/95 (05/04/2009)  Labs Reviewed: K+: 4.2 (01/21/2009) Creat: : 0.73 (01/21/2009)   Chol: 248 (03/21/2008)   HDL: 119 (03/21/2008)   LDL: 105 (03/21/2008)   TG: 122 (03/21/2008)  Complete Medication List: 1)  Metformin Hcl 1000 Mg Tabs (Metformin hcl) .... Take 1 tablet by mouth two times  a day 2)  Prinivil 10 Mg Tabs (Lisinopril) .... One tablet by mouth daily 3)  Aspir-low 81 Mg Tbec (Aspirin) .... One tablet daily  Other Orders: T-Lipid Profile ([id number]) T-Comprehensive Metabolic Panel [id number])  Patient Instructions: 1)  You had labwork done today, we will call you if there is anything that needs to be addressed. 2)  You will have xrays of your knees. Prescriptions: PRINIVIL 10 MG TABS (LISINOPRIL) one tablet by mouth daily  #30 x 11   Entered and Authorized by:   Elby Showers MD   Signed by:   Elby Showers MD on 06/05/2009   Method used:   Print then Give to Patient   RxID:   [id number]  Process Orders Check Orders Results:     [facility]: ABN not required for this insurance Tests Sent for requisitioning (June 11, 2009 8:35 PM):     06/05/2009: [facility] -- T-Lipid Profile [phone] (signed)     06/05/2009: [facility] -- T-Comprehensive Metabolic Panel [phone] (signed)    Prevention & Chronic Care Immunizations   Influenza vaccine: Fluvax Non-MCR  (01/21/2009)    Tetanus booster: Not documented   Td booster deferral: Deferred  (04/13/2009)    Pneumococcal vaccine: Not documented  Colorectal Screening   Hemoccult: Not documented   Hemoccult action/deferral: Refused  (04/13/2009)    Colonoscopy: Normal  (02/27/2004)   Colonoscopy due:  02/06/2014  Other Screening   Pap smear: Specimen Adequacy: Satisfactory for evaluation.   Interpretation/Result:Negative for intraepithelial Lesion or Malignancy.     (09/23/2008)    Mammogram: Assessment: BIRADS 2.   L breast US and digital bilateral diagnostic mammogram. Scarring changes w/in aubareolar portion of the left breast related to previous excisional biopsy. No evidence for mass and no suspicious microcalcifications. [name] of left axilla dis negative.   Location: [facility].     (11/14/2006)   Mammogram action/deferral: Ordered  (05/04/2009)   Mammogram due: 10/2007   Smoking status: quit  (05/04/2009)  Diabetes Mellitus   HgbA1C: 7.7  (04/20/2009)    Eye exam: Not documented   Diabetic eye exam action/deferral: Ophthalmology referral  (06/05/2009)    Foot exam: yes  (05/04/2009)   High risk foot: Not documented   Foot care education: Not documented    Urine microalbumin/creatinine ratio: 17.4  (09/12/2008)    Diabetes flowsheet reviewed?: Yes   Progress toward A1C goal: Unchanged  Lipids   Total Cholesterol: 248  (03/21/2008)   Lipid panel action/deferral: Lipid Panel ordered   LDL: 105  (03/21/2008)   LDL Direct: Not documented   HDL: 119  (03/21/2008)   Triglycerides: 122  (03/21/2008)    SGOT (AST): 26  (10/07/2008)   BMP action: Ordered   SGPT (ALT): 18  (10/07/2008) CMP ordered    Alkaline phosphatase: 85  (10/07/2008)   Total bilirubin: 0.4  (10/07/2008)    Lipid flowsheet reviewed?: Yes   Progress toward LDL goal: Unchanged  Hypertension   Last Blood Pressure: 147 / 85  (06/05/2009)   Serum creatinine: 0.73  (01/21/2009)   BMP action: Ordered   Serum potassium 4.2  (01/21/2009) CMP ordered     Hypertension flowsheet reviewed?: Yes   Progress toward BP goal: Unchanged  Self-Management Support :   Personal Goals (by the next clinic visit) :     Personal A1C goal: 7  (04/13/2009)     Personal blood pressure goal:  140/90  (04/13/2009)     Personal LDL goal: 130  (  04/13/2009)    Patient will work on the following items until the next clinic visit to reach self-care goals:     Medications and monitoring: take my medicines every day  (06/05/2009)     Eating: eat more vegetables, eat foods that are low in salt, eat baked foods instead of fried foods  (06/05/2009)     Activity: take a 30 minute walk every day  (06/05/2009)    Diabetes self-management support: Written self-care plan  (04/13/2009)    Hypertension self-management support: Written self-care plan  (04/13/2009)    Lipid self-management support: Written self-care plan  (04/13/2009)    Nursing Instructions: Refer for screening diabetic eye exam (see order)

## 2010-06-01 NOTE — Assessment & Plan Note (Signed)
Summary: pancreatic pain/pcp-reynolds/hla   Vital Signs:  Patient profile:   55 year old female Height:      61.5 inches (156.21 cm) Weight:      114.0 pounds (51.82 kg) BMI:     21.27 Temp:     98.0 degrees F (36.67 degrees C) oral Pulse rate:   83 / minute BP sitting:   160 / 82  (left arm) Cuff size:   regular  Vitals Entered By: Cynda Familia Duncan Dull) (November 23, 2009 3:16 PM) CC: pt c/o left side painm, no relief with tramadol  Is Patient Diabetic? Yes Did you bring your meter with you today? No Pain Assessment Patient in pain? yes     Location: leftside Intensity: 10 Type: stabbing Onset of pain  started yesterday Nutritional Status BMI of 19 -24 = normal  Have you ever been in a relationship where you felt threatened, hurt or afraid?No   Does patient need assistance? Functional Status Self care Ambulation Normal   Primary Care Provider:  Johnette Abraham DO  CC:  pt c/o left side painm and no relief with tramadol .  History of Present Illness: [name] is 55 year old female with multiple medical problems as descrbed in EMR most notable for pancreatic divisum managed by [facility] comes in today with chief c/o Abdominal pain.  She was operated (ERCP and stenting) last week of June and had been feeling a little better in terms of abdominal pain. She did describe diarrhea in earlier visit and was prescribed Imodium which is helping. She comes in today for abdominal pain which started 2 days ago, in the left side of her upper abdomen, non radiating, 10/10 at its worse, not a/w nausea or vomiting, no aggravating or relieving factors. She was on narcotics untill recently from our clinic but we stopped giving it to her after her surgery from [facility]. She tells me that she has an appointment for reevaluation next week on 4th Aug.   He denies any new sicknesses or hospitalizations, no chest pain episodes, no fevers, no chills, no abdominal or urinary concerns. No  recent changes in appetite, weight.   Preventive Screening-Counseling & Management  Alcohol-Tobacco     Alcohol drinks/day: 0     Smoking Status: quit     Year Quit: 2000     Pack years: 30  Problems Prior to Update: 1)  Chest Pain, Pleuritic  (ICD-786.52) 2)  Acute Nasopharyngitis  (ICD-460) 3)  Encounter For Long-term Use of Other Medications  (ICD-V58.69) 4)  Uti  (ICD-599.0) 5)  Gross Hematuria  (ICD-599.71) 6)  Leg Pain  (ICD-729.5) 7)  Pelvic Pain, Acute  (ICD-789.09) 8)  Weakness, Left Side of Body  (ICD-342.90) 9)  Throat Pain  (ICD-784.1) 10)  Gerd  (ICD-530.81) 11)  Hyperkalemia  (ICD-276.7) 12)  Pain in Joint, Multiple Sites  (ICD-719.49) 13)  Preventive Health Care  (ICD-V70.0) 14)  Mass, Left Axilla  (ICD-782.2) 15)  Pancreas Divisum  (ICD-751.7) 16)  Pancreatitis, Acute, Hx of  (ICD-V12.70) 17)  Thalassemia Nec  (ICD-282.49) 18)  Migraine Headache  (ICD-346.90) 19)  Dental Pain  (ICD-525.9) 20)  Pelvic Pain  (ICD-625.9) 21)  Abdominal Pain  (ICD-789.00) 22)  Laxity, Ligament  (ICD-728.4) 23)  Sickle Cell Trait  (ICD-282.5) 24)  Vaginitis, Atrophic  (ICD-627.3) 25)  Seizure Disorder  (ICD-780.39) 26)  Pulmonary Embolism, Hx of  (ICD-V12.51) 27)  Hypertension  (ICD-401.9) 28)  Hyperlipidemia  (ICD-272.4) 29)  Diabetes Mellitus, Type II  (ICD-250.00)  Medications Prior to Update: 1)  Metformin Hcl 1000 Mg Tabs (Metformin Hcl) .... Take 1 Tablet By Mouth Two Times A Day 2)  Lisinopril 20 Mg Tabs (Lisinopril) .... Take 1 Pill By Mouth Daily. 3)  Aspir-Low 81 Mg Tbec (Aspirin) .... One Tablet Daily 4)  Promethazine Hcl 25 Mg Tabs (Promethazine Hcl) .... Take One Tablet Every 6-8 Hours For Nausea. 5)  Tramadol Hcl 50 Mg Tabs (Tramadol Hcl) .Marland Kitchen.. 1-2 Pills 4 Times A Day As Needed  Current Medications (verified): 1)  Metformin Hcl 1000 Mg Tabs (Metformin Hcl) .... Take 1 Tablet By Mouth Two Times A Day 2)  Lisinopril 20 Mg Tabs (Lisinopril) .... Take 1 Pill By  Mouth Daily. 3)  Aspir-Low 81 Mg Tbec (Aspirin) .... One Tablet Daily 4)  Promethazine Hcl 25 Mg Tabs (Promethazine Hcl) .... Take One Tablet Every 6-8 Hours For Nausea. 5)  Hydrocodone-Acetaminophen 5-500 Mg Tabs (Hydrocodone-Acetaminophen) .... Take 1 Tablet By Mouth Four Times A Day  Allergies: 1)  ! Fiorinal (Butalbital-Aspirin-Caffeine) 2)  ! Fioricet (Butalbital-Apap-Caffeine) 3)  ! 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  Past Surgical History: Last updated: 11/10/2006 Hysterectomy for endometriosis when she was 23  Family History: Last updated: 09/23/2008 Sickle cell in brothers and cousin Grandmother had breast cancer F: prostate CA 80 living M: 84 healthy Sister sickle cell Mother's brother lung CA Mother's sister lung CA  Social History: Last updated: 09/23/2008 Lives alone Works at [employer] as a Merchandiser, retail, on Health visitor about 9hours/day former smoker - quit 2001; had smoked 30 years prior that Alcohol use-no Drug use-yes (reports no today) qui smokting 2000  Risk Factors: Alcohol Use: 0 (11/23/2009) Exercise: yes (11/05/2009)  Risk Factors: Smoking Status: quit (11/23/2009)  Family History: Reviewed history from 09/23/2008 and no changes required. Sickle cell in brothers and cousin Grandmother had breast cancer F: prostate CA 78 living M: 45 healthy Sister sickle cell Mother's brother lung  CA Mother's sister lung CA  Social History: Reviewed history from 09/23/2008 and no changes required. Lives alone Works at [employer] as a Merchandiser, retail, on Health visitor about 9hours/day former smoker - quit 2001; had smoked 30 years prior that Alcohol use-no Drug use-yes (reports no today) qui smokting 2000  Review of Systems      See HPI  Physical Exam  Additional Exam:  Gen: AOx3, in no acute distress Eyes: PERRL, EOMI ENT:MMM, No erythema noted in posterior pharynx Neck: No JVD, No LAP Chest: CTAB with  good respiratory effort CVS: regular rhythmic rate, NO M/R/G, S1 S2 normal Abdo: BS+ X 4, no guarding or rigidity, no Rebound TTP, tender to touch over left upper quadrant EXT: No odema noted Neuro: Non focal, gait is normal Skin: no rashes noted.    Impression & Recommendations:  Problem # 1:  PANCREAS DIVISUM (ICD-751.7) Assessment Comment Only Patient has had these pains and also has a h/o pancreatitis. I will check lipase today and also prescribe her some  pain meds enough for next 10 days until she sees her GI doctor at [facility]. I told her that narcotics will not be prescribed by our clinic from now on and she should demand any pain meds from her GI doctor after surgery as needed. I will check lipase and Bmet to be sure she is not in electrolyte abnormality and not having pancreatitis.  Case was discussed with Dr Coralee Pesa who saw the patient with Dr Marlana Salvage 3 weeks ago and knew the patient well. She agreed to prescribe her Vicodin at this time until she sees her GI doctor at [facility].  Orders: T-Lipase [id number])  Problem # 2:  ABDOMINAL PAIN (ICD-789.00) Assessment: Deteriorated See above #1.  Problem # 3:  HYPERTENSION (ICD-401.9) Assessment: Deteriorated Elevated BP today most likely 2/2 acute pain. no changes in meds today.  Her updated medication list for this problem includes:    Lisinopril 20 Mg Tabs (Lisinopril) .Marland Kitchen... Take 1 pill by mouth daily.  BP  today: 160/82 Prior BP: 148/77 (11/05/2009)  Prior 10 Yr Risk Heart Disease: 15 % (11/05/2009)  Labs Reviewed: K+: 3.7 (09/07/2009) Creat: : 0.71 (09/07/2009)   Chol: 214 (06/05/2009)   HDL: 92 (06/05/2009)   LDL: 103 (06/05/2009)   TG: 95 (06/05/2009)  Problem # 4:  DIABETES MELLITUS, TYPE II (ICD-250.00) Assessment: Comment Only At goal Hba1c.  Her updated medication list for this problem includes:    Metformin Hcl 1000 Mg Tabs (Metformin hcl) .Marland Kitchen... Take 1 tablet by mouth two times a day    Lisinopril 20 Mg Tabs (Lisinopril) .Marland Kitchen... Take 1 pill by mouth daily.    Aspir-low 81 Mg Tbec (Aspirin) ..... One tablet daily  Labs Reviewed: Creat: 0.71 (09/07/2009)     Last Eye Exam: No diabetic retinopathy.    (08/19/2009) Reviewed HgBA1c results: 6.8 (10/09/2009)  7.7 (04/20/2009)  Complete Medication List: 1)  Metformin Hcl 1000 Mg Tabs (Metformin hcl) .... Take 1 tablet by mouth two times a day 2)  Lisinopril 20 Mg Tabs (Lisinopril) .... Take 1 pill by mouth daily. 3)  Aspir-low 81 Mg Tbec (Aspirin) .... One tablet daily 4)  Promethazine Hcl 25 Mg Tabs (Promethazine hcl) .... Take one tablet every 6-8 hours for nausea. 5)  Hydrocodone-acetaminophen 5-500 Mg Tabs (Hydrocodone-acetaminophen) .... Take 1 tablet by mouth four times a day  Other Orders: T-Basic Metabolic Panel [id number]) T-Urinalysis [id number])  Patient Instructions: 1)  Follow up in clinic after you have your surgery at [facility]. 2)  Ask your Doctor at [facility] for Pain meds after the procedure as we will not prescribe you any narcotics for your abdominal pain. 3)  Call clinic or come to ED if pain is worsening. Prescriptions: HYDROCODONE-ACETAMINOPHEN 5-500 MG TABS (HYDROCODONE-ACETAMINOPHEN) Take 1 tablet by mouth four times a day  #40 x 0   Entered and Authorized by:   Lars Mage MD   Signed by:   Lars Mage MD on 11/23/2009   Method used:   Print then Give to Patient   RxID:   [id number]  Process  Orders Check Orders Results:     [facility]: ABN not required for this insurance Tests Sent for requisitioning (November 27, 2009 1:43 PM):     11/23/2009: [facility] -- T-Basic Metabolic Panel [phone] (signed)     11/23/2009: [facility] -- T-Lipase [id number] (signed)     11/23/2009: [facility] -- T-Urinalysis [[id number]] (signed)    Process Orders Check Orders Results:     [facility]:  ABN not required for this insurance Tests Sent for requisitioning (November 27, 2009 1:43 PM):     11/23/2009: [facility] -- T-Basic Metabolic Panel [phone] (signed)     11/23/2009: [facility] -- T-Lipase [phone] (signed)     11/23/2009: [facility] -- T-Urinalysis [[id number]] (signed)    Prevention & Chronic Care Immunizations   Influenza vaccine: Fluvax Non-MCR  (01/21/2009)   Influenza vaccine deferral: Deferred  (11/23/2009)    Tetanus booster: Not documented   Td booster deferral: Deferred  (11/23/2009)    Pneumococcal vaccine: Not documented  Colorectal Screening   Hemoccult: Not documented   Hemoccult action/deferral: Refused  (04/13/2009)    Colonoscopy: Normal  (02/27/2004)   Colonoscopy due: 02/06/2014  Other Screening   Pap smear: Specimen Adequacy: Satisfactory for evaluation.   Interpretation/Result:Negative for intraepithelial Lesion or Malignancy.     (09/23/2008)   Pap smear action/deferral: Deferred  (11/23/2009)    Mammogram: Assessment: BIRADS 2.   L breast US and digital bilateral diagnostic mammogram. Scarring changes w/in aubareolar portion of the left breast related to previous excisional biopsy. No evidence for mass and no suspicious microcalcifications. [name] of left axilla dis negative.   Location: [facility].     (11/14/2006)   Mammogram action/deferral: Deferred  (11/23/2009)   Mammogram  due: 10/2007   Smoking status: quit  (11/23/2009)  Diabetes Mellitus   HgbA1C: 6.8  (10/09/2009)    Eye exam: No diabetic retinopathy.     (08/19/2009)   Diabetic eye exam action/deferral: Ophthalmology referral  (06/05/2009)   Eye exam due: 08/2010    Foot exam: yes  (05/04/2009)   High risk foot: Not documented   Foot care education: Not documented    Urine microalbumin/creatinine ratio: 17.4  (09/12/2008)    Diabetes flowsheet reviewed?: Yes   Progress toward A1C goal: At goal  Lipids   Total Cholesterol: 214  (06/05/2009)   Lipid panel action/deferral: Lipid Panel ordered   LDL: 103  (06/05/2009)   LDL Direct: Not documented   HDL: 92  (06/05/2009)   Triglycerides: 95  (06/05/2009)    SGOT (AST): 18  (09/07/2009)   BMP action: Ordered   SGPT (ALT): 12  (09/07/2009)   Alkaline phosphatase: 86  (09/07/2009)   Total bilirubin: 0.9  (09/07/2009)    Lipid flowsheet reviewed?: Yes   Progress toward LDL goal: At goal  Hypertension   Last Blood Pressure: 160 / 82  (11/23/2009)   Serum creatinine: 0.71  (09/07/2009)   BMP action: Ordered   Serum potassium 3.7  (09/07/2009)    Hypertension flowsheet reviewed?: Yes   Progress toward BP goal: Unchanged  Self-Management Support :   Personal Goals (by the next clinic visit) :     Personal A1C goal: 7  (04/13/2009)     Personal blood pressure goal: 140/90  (04/13/2009)     Personal LDL goal: 130  (04/13/2009)    Diabetes self-management support: Resources for patients handout  (11/05/2009)    Hypertension self-management support: Resources for patients handout  (11/05/2009)    Lipid self-management support: Resources for patients handout  (11/05/2009)

## 2010-06-01 NOTE — Medication Information (Signed)
Summary: RX HISTORY REPORT  RX HISTORY REPORT   Imported By: Margie Billet 04/06/2010 11:15:27  _____________________________________________________________________  External Attachment:    Type:   Image     Comment:   External Document

## 2010-06-01 NOTE — Consult Note (Signed)
Summary: [facility]  [facility]   Imported By: Louretta Parma 12/08/2009 11:10:25  _____________________________________________________________________  External Attachment:    Type:   Image     Comment:   External Document

## 2010-06-01 NOTE — Discharge Summary (Signed)
Summary: Hospital Discharge Update    Hospital Discharge Update:  Date of Admission: 09/11/2009 Date of Discharge: 09/19/2009  Brief Summary:  Pt was admitted with abdominal pain consistent with pancreatitis with an elevated lipase.  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   After many days she began to tolerate regular diet and oral pain meds.  The plan at d/c is for her to follow up at [facility] for possible stenting of her pancreatic duct.  Other follow-up issues:  Please inquire about resolution or persistence of abdominal pain.  Please inquire about diarrhea as I am restarting her metformin at discharge.  Pt will need a referral to [facility] for possible stenting of her pancreatic duct.  Medication list changes:  Added new medication of PERCOCET 5-325 MG TABS (OXYCODONE-ACETAMINOPHEN) Take 1 tab by mouth every 4 hrs as needed for pain. - Signed Added new medication of MS CONTIN 15 MG XR12H-TAB (MORPHINE SULFATE) Take 1 tablet by mouth two times a day. - Signed Removed medication of ULTRAM 50 MG TABS (TRAMADOL HCL) Take one tablet two times a day for pain. - Signed Rx of PERCOCET 5-325 MG TABS (OXYCODONE-ACETAMINOPHEN) Take 1 tab by mouth every 4 hrs as needed for pain.;  #42 x 0;  Signed;  Entered by: Brooks Sailors MD;  Authorized by: Brooks Sailors MD;  Method used: Handwritten Rx of MS CONTIN 15 MG XR12H-TAB (MORPHINE SULFATE) Take 1 tablet by mouth two times a day.;  #60 x 0;  Signed;  Entered by: Brooks Sailors MD;  Authorized by: Brooks Sailors MD;  Method used: Handwritten  The medication, problem, and allergy lists have been updated.  Please see the dictated discharge summary for details.  Discharge medications:  METFORMIN HCL 1000 MG TABS (METFORMIN HCL) Take 1 tablet by mouth two times a day PRINIVIL 10 MG TABS (LISINOPRIL) one tablet by mouth daily ASPIR-LOW 81 MG TBEC (ASPIRIN) One tablet daily PROMETHAZINE HCL 25 MG TABS  (PROMETHAZINE HCL) Take one tablet every 6-8 hours for nausea. PERCOCET 5-325 MG TABS (OXYCODONE-ACETAMINOPHEN) Take 1 tab by mouth every 4 hrs as needed for pain. MS CONTIN 15 MG XR12H-TAB (MORPHINE SULFATE) Take 1 tablet by mouth two times a day.  Other patient instructions:  You will be called about a follow up appointment at [facility] early this week. At your follow up we will try to get you set up for an appointment at [facility] for your pancreas. If you have worsening abdominal pain or diarrhea please call the clinic or come back to the ED. We are starting 2 new medicines to try to keep your pain under control: MS Contin and percocet.  Note: Hospital Discharge Medications & Other Instructions handout was printed, one copy for patient and a second copy to be placed in hospital chart.

## 2010-06-01 NOTE — Progress Notes (Signed)
Summary: appt/ hla  Phone Note Call from Patient   Summary of Call: pt calls and ask for appt for mon, states she started having flank pain early this am and now has sm amt of blood in urine, denies fever, any/all other uti s/s. she is referred to urg care or ed, she states she would rather be seen in clinic, appt is made mon w/ pcp. states nothing helps or makes it worse. Initial call taken by: Marin Roberts RN,  Sep 04, 2009 4:33 PM

## 2010-06-01 NOTE — Assessment & Plan Note (Signed)
Summary: flank pain, blood in urine/pcp-Emillia Weatherly/hla   Vital Signs:  Patient profile:   55 year old female Height:      61.5 inches Weight:      114.8 pounds BMI:     21.42 Temp:     97.6 degrees F oral Pulse (ortho):   62 / minute BP sitting:   170 / 100  (right arm)  Vitals Entered By: Filomena Jungling NT II (Sep 07, 2009 2:47 PM) CC: SINCE FRIDAY LOWER BACK PAIN Is Patient Diabetic? Yes Did you bring your meter with you today? No  Have you ever been in a relationship where you felt threatened, hurt or afraid?No   Does patient need assistance? Functional Status Self care Ambulation Normal   Primary Care Provider:  Elby Showers MD  CC:  SINCE FRIDAY LOWER BACK PAIN.  History of Present Illness: 55yof with pmh outlined in this chart.  She is here today with complaint of flank pain and blood in the urine.  Her UA right now in clinic is negative.  She reports that she has had left flank pain, blood in her urine on and off, now has developed diarrhea and stomach pain.  She has never had a kidney stone.  She has been in bed since Friday.  No vomiting, is able to eat and hold food down.  Sometimes with nausea.  She has had uncontrolable diarrhea, and took immodium today which has slowed her down.  Drinking or eating makes the pain worse and gives her worsening diarrhea.  Current Medications (verified): 1)  Metformin Hcl 1000 Mg Tabs (Metformin Hcl) .... Take 1 Tablet By Mouth Two Times A Day 2)  Prinivil 10 Mg Tabs (Lisinopril) .... One Tablet By Mouth Daily 3)  Aspir-Low 81 Mg Tbec (Aspirin) .... One Tablet Daily  Allergies (verified): 1)  ! Fiorinal (Butalbital-Aspirin-Caffeine) 2)  ! Fioricet (Butalbital-Apap-Caffeine) 3)  ! Sulfa  Review of Systems       per hpi  Physical Exam  General:  alert and well-developed.   Head:  normocephalic and atraumatic.   Eyes:  vision grossly intact, pupils equal, pupils round, pupils reactive to light, and conjunctival injection.   Mouth:   good dentition and pharynx pink and moist.   Lungs:  normal respiratory effort and normal breath sounds.   Heart:  normal rate, regular rhythm, and no murmur.   Abdomen:  soft, normal bowel sounds, no distention, no masses, no hepatomegaly, no splenomegaly, and guarding.  tenderness to palpation diffusely worst in the left upper quadrant and left CVA.  There are no skin changes. Extremities:  no edema Neurologic:  alert & oriented X3 and cranial nerves II-XII intact.   Psych:  Oriented X3, memory intact for recent and remote, and normally interactive.  very anxious and tearful.   Impression & Recommendations:  Problem # 1:  ABDOMINAL PAIN (ICD-789.00) Stat labs all negative.  No white count, elevation in LFT's or lipase.  UA is negative both on dip and on formal lab.  Renal protocol CT abd/pelvis is negative for stone.  I think she may have a gi bug which is causing nausea, diarrhea and some discomfort.  I have given prescription for phenergan and tramadol and advised her to continue taking immodium.  I have called her to let her know of these results.  Orders: T-CBC No Diff ([id number]) T-Comprehensive Metabolic Panel ([id number]) T-Lipase ([id number]) CT without Contrast (CT w/o contrast)  Complete Medication List: 1)  Metformin Hcl 1000 Mg Tabs (  Metformin hcl) .... Take 1 tablet by mouth two times a day 2)  Prinivil 10 Mg Tabs (Lisinopril) .... One tablet by mouth daily 3)  Aspir-low 81 Mg Tbec (Aspirin) .... One tablet daily 4)  Ultram 50 Mg Tabs (Tramadol hcl) .... Take one tablet two times a day for pain. 5)  Promethazine Hcl 25 Mg Tabs (Promethazine hcl) .... Take one tablet every 6-8 hours for nausea.  Other Orders: T-Urinalysis ([id number])  Patient Instructions: 1)  You have new prescriptions for pain and nausea.  You should keep taking imodium for diarrhea. 2)  You had lab work done today, we will call you with results. 3)  You will be set up for a CT scan to be sure  that you do not have a kidney stone. Prescriptions: PROMETHAZINE HCL 25 MG TABS (PROMETHAZINE HCL) Take one tablet every 6-8 hours for nausea.  #20 x 0   Entered and Authorized by:   Elby Showers MD   Signed by:   Elby Showers MD on 09/07/2009   Method used:   Print then Give to Patient   RxID:   [id number] ULTRAM 50 MG TABS (TRAMADOL HCL) Take one tablet two times a day for pain.  #30 x 0   Entered and Authorized by:   Elby Showers MD   Signed by:   Elby Showers MD on 09/07/2009   Method used:   Print then Give to Patient   RxID:   [id number]

## 2010-06-01 NOTE — Progress Notes (Signed)
Summary: appt/ hla  Phone Note Call from Patient   Summary of Call: pt calls insisting that dr Rhian Funari called and told her she should be seen before 6/14 for [facility],  at pt's insistance appt is rescheduled, she states she will be here at appt time  Initial call taken by: Marin Roberts RN,  October 05, 2009 2:18 PM  Follow-up for Phone Call        I called pt 6/3 when I realized her [facility] referral hadn't been made yet.  Told pt if not feeling better to let us know and we could work her in sooner.  I already started the [facility] referral proccess.  Symptomatic treatment for pancreatic divisum until [facility] appt. Follow-up by: Blanch Media MD,  October 05, 2009 2:28 PM

## 2010-06-01 NOTE — Progress Notes (Signed)
  Phone Note Outgoing Call   Call placed by: Blanch Media MD,  October 02, 2009 3:54 PM Call placed to: Patient Summary of Call: Pt recently D/C'd for pancreatitis thought 2/2 pancreatic divisum.  GI recommended pt be referred as an outpt to [facility], where there are specialists dealing with this condiction.  This was not set up at D/C and pt's F/U appt not until 6/14.  Pt still with pain but able to eat solid food.  Pt would like [name] to go ahead and get [facility] referral started.  Pt can call us if she needs an appt sooner.

## 2010-06-01 NOTE — Assessment & Plan Note (Signed)
Summary: Abdominal pain   Vital Signs:  Patient profile:   55 year old female Height:      61.5 inches (156.21 cm) Weight:      115.0 pounds (52.27 kg) BMI:     21.45 Temp:     97.3 degrees F (36.28 degrees C) oral Pulse rate:   73 / minute BP sitting:   167 / 90  (right arm) Cuff size:   regular  Vitals Entered By: Theotis Barrio NT II (April 01, 2010 3:59 PM)  CC: ABD PAIN / ON GOING SINCE SURGERY IN MAY OF O11 Is Patient Diabetic? Yes Did you bring your meter with you today? No Pain Assessment Patient in pain? yes     Location: abdomen Intensity:       1O Type: sharp Onset of pain  ON GOING  SINCE MAY 011 Nutritional Status BMI of 19 -24 = normal CBG Result 168  Have you ever been in a relationship where you felt threatened, hurt or afraid?No   Does patient need assistance? Functional Status Self care Ambulation Normal    Primary Care Provider:  Johnette Abraham DO  CC:  ABD PAIN / ON GOING SINCE SURGERY IN MAY OF O11.  History of Present Illness: Patient is a 55yo female with PMHx of pancreatitic divisum, thalassemia, DMII who presents to clinic for the following acute concern:  1) Abdominal pain - has been ongoing for at least past 1 month with intermittent pain, feelings like something is pushing down on vagina, worst for the past 2 days, keeping pt from work. was evaluated by gyn recently for this pain, who determined pain was not related to gyn source. current pain described as 10/10 pain, left lower quadrant goes to back, sharp intermittent lasting for several hours at a time, taking bentyl and tramadol which mildly improve the pain.  no fevers/ chills, associated with nausea, no vomiting, eating soup during thistime. No positional attributes, only better with pain medicine. Aggravating factors none.  Having loose stools since May 2011 when she had pancreatic stenting.  No hematuria, dysuria,  no gerd symptoms.  Denies depression symptoms, refuses  antidepressant therapy.   2) HTN - no chest pain, difficulty breathing, shortness of breath, headaches, lightheadedness, dizziness.   3) DM - taking Metformin regularly without issue. No polyuria, polydipsia.     Preventive Screening-Counseling & Management  Alcohol-Tobacco     Alcohol drinks/day: 0     Smoking Status: quit     Year Quit: 2000     Pack years: 30  Caffeine-Diet-Exercise     Does Patient Exercise: yes     Type of exercise: FLOOR EXERCISE     Times/week: 2  Current Medications (verified): 1)  Metformin Hcl 1000 Mg Tabs (Metformin Hcl) .... Take 1 Tablet By Mouth Two Times A Day 2)  Lisinopril 20 Mg Tabs (Lisinopril) .... Take 1 Pill By Mouth Daily. 3)  Vicodin 5-500 Mg Tabs (Hydrocodone-Acetaminophen) .... Take 1 Tablet By Mouth Three Times A Day As Needed For Pain  Allergies: 1)  ! Fiorinal (Butalbital-Aspirin-Caffeine) 2)  ! Fioricet (Butalbital-Apap-Caffeine) 3)  ! Sulfa  Review of Systems       per HPI  Physical Exam  General:  Vital signs reviewed and noted, mildly tachycardic. Well-developed,well-nourished,tearful, stomach clutched in pain, alert and cooperative throughout examination. Head: normocephalic, atraumatic. Neck: No deformities, masses, or tenderness noted. Lungs: Normal respiratory effort. Clear to auscultation BL without crackles or wheezes.  Heart: RRR. S1 and S2 normal  without gallop, murmur, or rubs.  Abdomen: BS normoactive. Soft, Nondistended, diffuse TTP, No masses or organomegaly. Extremities: No pretibial edema.     Impression & Recommendations:  Problem # 1:  ABDOMINAL PAIN (ICD-789.00) Assessment Deteriorated Pain persistent. Gyn eval was negative. Unclear if organic cause of pain versus psychologic cause of chronic pain. Patient refuses anti-depressant thearpy. - Will check CT abd to evaluate pancreatic status given hx of pancreatic divisum with recent removal of stent after migration - Continue short term narcotics  until better understanding of pain origin can be elucidated.  - Consider referral back to GI as indicated after CT abd results return.  Problem # 2:  HYPERTENSION (ICD-401.9) Assessment: Deteriorated Blood pressure is still elevated, however this is in the setting of acute 10/10 pain, and recent evaluations have all been during acute pain scenarios where pt will be literally writhing in pain. - Therefore, will not increase medication at this time --> will reassess next visit and plan increase Lisinopril to 40mg  versus add HCTZ if persistent elevation regardless of pain situation.   Her updated medication list for this problem includes:    Lisinopril 20 Mg Tabs (Lisinopril) .Marland Kitchen... Take 1 pill by mouth daily.  Problem # 3:  DIABETES MELLITUS, TYPE II (ICD-250.00) Last A1c 6.5, therefore good control despite isolated hyperglycemia today, which does not reflect a fasting value.  - Continue current   The following medications were removed from the medication list:    Aspir-low 81 Mg Tbec (Aspirin) ..... One tablet daily Her updated medication list for this problem includes:    Metformin Hcl 1000 Mg Tabs (Metformin hcl) .Marland Kitchen... Take 1 tablet by mouth two times a day    Lisinopril 20 Mg Tabs (Lisinopril) .Marland Kitchen... Take 1 pill by mouth daily.  Orders: Capillary Blood Glucose/CBG [phone])  Problem # 4:  PREVENTIVE HEALTH CARE (ICD-V70.0) Assessment: Comment Only Last PAP: 03/2010                              - wnl          --> due 03/2011 Last mammogram: 03/2010                - BIRADS 1 --> due 03/2011 Last colonoscopy: 01/2004               - wnl           --> due 01/2014 Last FLP: 06/2009                              - Tchol 214, LDL 103, HDL 42, Trig 95 --> due 06/2010 Last HgA1c: 01/2010                         - 6.5            --> due 05/2009 Last Microalbumin/Cr ratio:03/2010   - 14.6           --> due 03/2011 Last Foot Exam: 05/2009                   - wnl            --> due 05/2010 Last  Eye Exam: 07/2009                    - wnl            -->  due 08/2010 Last CMP: 03/2010                            - Tbili 0.4, alk phos 88, AST 24, ALT 12, T prot 9.5, alb 5.4, Ca 10.7  --> due 08/2010  Complete Medication List: 1)  Metformin Hcl 1000 Mg Tabs (Metformin hcl) .... Take 1 tablet by mouth two times a day 2)  Lisinopril 20 Mg Tabs (Lisinopril) .... Take 1 pill by mouth daily. 3)  Vicodin 5-500 Mg Tabs (Hydrocodone-acetaminophen) .... Take 1 tablet by mouth three times a day as needed for pain  Other Orders: CT without Contrast (CT w/o contrast)   Patient Instructions: 1)  Please follow-up at the clinic in 1-2 weeks, at which time we will reevaluate your abdominal pain. 2)  Do not take vicodin with alcohol or drive/operate heavy machinery. 3)  You will have a CT scan of your abdomen, you will be called with the appointment. 4)  If you have worsening of your symptoms, please call the clinic. 5)  Please bring all of your medications in a bag to your next visit.  Prescriptions: VICODIN 5-500 MG TABS (HYDROCODONE-ACETAMINOPHEN) Take 1 tablet by mouth three times a day as needed for pain  #42 x 0   Entered and Authorized by:   Johnette Abraham DO   Signed by:   Johnette Abraham DO on 04/01/2010   Method used:   Print then Give to Patient   RxID:   [id number]    Orders Added: 1)  Capillary Blood Glucose/CBG [44818] 2)  CT without Contrast [CT w/o contrast] 3)  Est. Patient Level IV [56314]   Immunization History:  Pneumovax Immunization History:    Pneumovax:  pneumovax (12/12/2007)    Pneumovax:  pneumovax (02/17/2004)   Immunization History:  Pneumovax Immunization History:    Pneumovax:  Pneumovax (12/12/2007)    Pneumovax:  Pneumovax (02/17/2004)      Last LDL:                                                 103 (06/05/2009 8:46:00 PM)          Diabetic Foot Exam  Set Next Diabetic Foot Exam here: 05/04/2010   10-g (5.07)  Semmes-Weinstein Monofilament Test           Right Foot          Left Foot Visual Inspection     normal           normal   Prevention & Chronic Care Immunizations   Influenza vaccine: Fluvax MCR  (03/16/2010)   Influenza vaccine deferral: Deferred  (04/01/2010)    Tetanus booster: Not documented   Td booster deferral: Deferred  (11/23/2009)    Pneumococcal vaccine: Pneumovax  (12/12/2007)  Colorectal Screening   Hemoccult: Not documented   Hemoccult action/deferral: Refused  (04/13/2009)    Colonoscopy: Normal  (02/27/2004)   Colonoscopy due: 02/26/2014  Other Screening   Pap smear: No malignancy.  (03/11/2010)   Pap smear action/deferral: Deferred  (11/23/2009)   Pap smear due: 03/12/2011    Mammogram: ASSESSMENT: Negative - BI-RADS 1^MM DIGITAL SCREENING  (03/29/2010)   Mammogram action/deferral: Ordered  (03/16/2010)   Mammogram due: 03/30/2011   Smoking status: quit  (04/01/2010)  Diabetes Mellitus   HgbA1C: 6.5  (  02/08/2010)   Hemoglobin A1C due: 05/11/2010    Eye exam: No diabetic retinopathy.     (08/19/2009)   Diabetic eye exam action/deferral: Ophthalmology referral  (06/05/2009)   Eye exam due: 08/20/2010    Foot exam: yes  (05/04/2009)   High risk foot: Not documented   Foot care education: Not documented   Foot exam due: 05/04/2010    Urine microalbumin/creatinine ratio: 14.6  (03/16/2010)   Urine microalbumin action/deferral: Ordered   Urine microalbumin/cr due: 03/17/2011    Diabetes flowsheet reviewed?: Yes   Progress toward A1C goal: Improved  Lipids   Total Cholesterol: 214  (06/05/2009)   Lipid panel action/deferral: Lipid Panel ordered   LDL: 103  (06/05/2009)   LDL Direct: Not documented   HDL: 92  (06/05/2009)   Triglycerides: 95  (06/05/2009)    SGOT (AST): 24  (03/16/2010)   BMP action: Ordered   SGPT (ALT): 12  (03/16/2010)   Alkaline phosphatase: 88  (03/16/2010)   Total bilirubin: 0.4  (03/16/2010)    Lipid flowsheet  reviewed?: Yes   Progress toward LDL goal: Improved  Hypertension   Last Blood Pressure: 167 / 90  (04/01/2010)   Serum creatinine: 0.75  (03/16/2010)   BMP action: Ordered   Serum potassium 3.9  (03/16/2010)   Basic metabolic panel due: 04/15/2010    Hypertension flowsheet reviewed?: Yes   Progress toward BP goal: Deteriorated  Self-Management Support :   Personal Goals (by the next clinic visit) :     Personal A1C goal: 7  (04/13/2009)     Personal blood pressure goal: 140/90  (04/13/2009)     Personal LDL goal: 130  (04/13/2009)    Diabetes self-management support: Written self-care plan, Education handout, Pre-printed educational material, Resources for patients handout  (03/16/2010)    Hypertension self-management support: Written self-care plan, Education handout, Pre-printed educational material, Resources for patients handout  (03/16/2010)    Lipid self-management support: Written self-care plan, Education handout, Pre-printed educational material, Resources for patients handout  (03/16/2010)     Appended Document: Abdominal pain I discussed the patient with Dr. Tonny Branch and I agree with the assessment and plan as outlined above. Abd bloating seems benign and I agree with Dr Donnelly Stager A/P.

## 2010-06-01 NOTE — Progress Notes (Signed)
Summary: rtc/ hla  Phone Note Call from Patient   Summary of Call: pt called and stated someone had been calling her from the clinic but she did not know who, she states she will be home this pm and whoever called may call her back Initial call taken by: Marin Roberts RN,  March 19, 2010 4:12 PM  Follow-up for Phone Call        I have called pt and told her that her lab is unremakable. She said she still has bad abdominal pain, not relieved with tramadol and bentyl. Will change tramadol to vicodin.  Follow-up by: Jackson Latino MD,  March 24, 2010 2:15 PM    New/Updated Medications: VICODIN 5-500 MG TABS (HYDROCODONE-ACETAMINOPHEN) Take 1 tablet by mouth three times a day as needed for pain Prescriptions: VICODIN 5-500 MG TABS (HYDROCODONE-ACETAMINOPHEN) Take 1 tablet by mouth three times a day as needed for pain  #30 x 0   Entered and Authorized by:   Jackson Latino MD   Signed by:   Jackson Latino MD on 03/24/2010   Method used:   Telephoned to ...       [facility] [address] [id number]* (retail)       [address] [place], [place]  [address]       Ph: [phone]       Fax: [phone]   RxID:   [id number]   Appended Document: rtc/ hla Vicodin Rx called to [facility] pharmacy also pt. was called and made awared.

## 2010-06-01 NOTE — Assessment & Plan Note (Signed)
Summary: [name], needs referral/pcp-walsh/hla   Vital Signs:  Patient profile:   55 year old female Height:      61.5 inches (156.21 cm) Weight:      112.2 pounds (51 kg) BMI:     20.93 Temp:     99.1 degrees F (37.28 degrees C) oral Pulse rate:   72 / minute BP sitting:   174 / 87  (right arm)  Vitals Entered By: Stanton Kidney Ditzler RN (October 09, 2009 3:55 PM) Is Patient Diabetic? Yes Did you bring your meter with you today? No Pain Assessment Patient in pain? yes     Location: upper abd Intensity: 8 Type: stabbing Onset of pain  since being in hospital Nutritional Status BMI of 19 -24 = normal Nutritional Status Detail appetite down CBG Result 96  Have you ever been in a relationship where you felt threatened, hurt or afraid?denies   Does patient need assistance? Functional Status Self care Ambulation Normal Comments [name] - needs meds fpr nausea and pain. Going to [facility] 10/16/09 ? surgery. BP reck at 4:30PM right arm 172/84 pulse 66 - Dr Aleene Davidson aware.   Primary Care Provider:  Elby Showers MD   History of Present Illness: [name] comes for a [name] visit. She was admitted for acute pancreatitis from pancreatic divisim. Pain has improved but is still peristent. She still has nausea. She is still eating semisolid food and has worseneing pain with solid food. She has an app[facility] friday. She takes MS contin two times a day and she takes percocet three times a day on average.   She has no diarrhea.   Depression History:      The patient denies a depressed mood most of the day and a diminished interest in her usual daily activities.         Preventive Screening-Counseling & Management  Alcohol-Tobacco     Alcohol drinks/day: 0     Smoking Status: quit     Year Quit: 2000     Pack years: 30  Caffeine-Diet-Exercise     Does Patient Exercise: yes     Type of exercise: FLOOR EXERCISE     Times/week: 2  Current Medications (verified): 1)   Metformin Hcl 1000 Mg Tabs (Metformin Hcl) .... Take 1 Tablet By Mouth Two Times A Day 2)  Lisinopril 20 Mg Tabs (Lisinopril) .... Take 1 Pill By Mouth Daily. 3)  Aspir-Low 81 Mg Tbec (Aspirin) .... One Tablet Daily 4)  Promethazine Hcl 25 Mg Tabs (Promethazine Hcl) .... Take One Tablet Every 6-8 Hours For Nausea. 5)  Percocet 5-325 Mg Tabs (Oxycodone-Acetaminophen) .... Take 1 Tab By Mouth Every 4 Hrs As Needed For Pain. 6)  Ms Contin 15 Mg Xr12h-Tab (Morphine Sulfate) .... Take 1 Tablet By Mouth Two Times A Day.  Allergies: 1)  ! Fiorinal (Butalbital-Aspirin-Caffeine) 2)  ! Fioricet (Butalbital-Apap-Caffeine) 3)  ! Sulfa  Review of Systems      See HPI  Physical Exam  Mouth:  pharynx pink and moist.   Lungs:  normal breath sounds, no crackles, and no wheezes.   Heart:  normal rate, regular rhythm, no murmur, and no gallop.   Abdomen:  soft, normal bowel sounds, no distention, no masses, no guarding, no rigidity, and no rebound tenderness.  There is mild tenderness on the LUQ.  Extremities:  trace left pedal edema.   Neurologic:  alert & oriented X3.     Impression & Recommendations:  Problem # 1:  PANCREATITIS, ACUTE, HX OF (ICD-V12.70) I refilled her percocet and phenergan. Please see HPI. She will go to [facility] next week. I asked her to go to radiology dept to get her scans in a disk. WIll f/u in 2 wks.   Problem # 2:  HYPERTENSION (ICD-401.9) BP elebated. Repeat BP still same and pt is taking her meds. Pain may be contributing, but BP is very high, so will increase the dose of lisinopril to 20 mg daily.   Her updated medication list for this problem includes:    Lisinopril 20 Mg Tabs (Lisinopril) .Marland Kitchen... Take 1 pill by mouth daily.  BP today: 174/87 Prior BP: 170/100 (09/07/2009)  Labs Reviewed: K+: 3.7 (09/07/2009) Creat: : 0.71 (09/07/2009)   Chol: 214 (06/05/2009)   HDL: 92 (06/05/2009)   LDL: 103 (06/05/2009)   TG: 95 (06/05/2009)  Complete Medication List: 1)   Metformin Hcl 1000 Mg Tabs (Metformin hcl) .... Take 1 tablet by mouth two times a day 2)  Lisinopril 20 Mg Tabs (Lisinopril) .... Take 1 pill by mouth daily. 3)  Aspir-low 81 Mg Tbec (Aspirin) .... One tablet daily 4)  Promethazine Hcl 25 Mg Tabs (Promethazine hcl) .... Take one tablet every 6-8 hours for nausea. 5)  Percocet 5-325 Mg Tabs (Oxycodone-acetaminophen) .... Take 1 tab by mouth every 4 hrs as needed for pain. 6)  Ms Contin 15 Mg Xr12h-tab (Morphine sulfate) .... Take 1 tablet by mouth two times a day.  Other Orders: T- Capillary Blood Glucose (82948) T-Hgb A1C (in-house) ([id number])  Patient Instructions: 1)  Please schedule a follow-up appointment in 2 months. 2)  Limit your Sodium (Salt) to less than 2 grams a day(slightly less than 1/2 a teaspoon) to prevent fluid retention, swelling, or worsening of symptoms. 3)  It is important that you exercise regularly at least 20 minutes 5 times a week. If you develop chest pain, have severe difficulty breathing, or feel very tired , stop exercising immediately and seek medical attention. 4)  Check your Blood Pressure regularly. If it is above: you should make an appointment. Prescriptions: LISINOPRIL 20 MG TABS (LISINOPRIL) take 1 pill by mouth daily.  #30 x 1   Entered and Authorized by:   Jason Coop MD   Signed by:   Jason Coop MD on 10/09/2009   Method used:   Print then Give to Patient   RxID:   [id number] PERCOCET 5-325 MG TABS (OXYCODONE-ACETAMINOPHEN) Take 1 tab by mouth every 4 hrs as needed for pain.  #42 x 0   Entered and Authorized by:   Jason Coop MD   Signed by:   Jason Coop MD on 10/09/2009   Method used:   Print then Give to Patient   RxID:   [id number] PROMETHAZINE HCL 25 MG TABS (PROMETHAZINE HCL) Take one tablet every 6-8 hours for nausea.  #30 x 1   Entered and Authorized by:   Jason Coop MD   Signed by:   Jason Coop MD on 10/09/2009   Method used:    Print then Give to Patient   RxID:   [id number]    Prevention & Chronic Care Immunizations   Influenza vaccine: Fluvax Non-MCR  (01/21/2009)    Tetanus booster: Not documented   Td booster deferral: Deferred  (04/13/2009)    Pneumococcal vaccine: Not documented  Colorectal Screening   Hemoccult: Not documented   Hemoccult action/deferral: Refused  (04/13/2009)    Colonoscopy: Normal  (02/27/2004)   Colonoscopy due: 02/06/2014  Other Screening  Pap smear: Specimen Adequacy: Satisfactory for evaluation.   Interpretation/Result:Negative for intraepithelial Lesion or Malignancy.     (09/23/2008)    Mammogram: Assessment: BIRADS 2.   L breast US and digital bilateral diagnostic mammogram. Scarring changes w/in aubareolar portion of the left breast related to previous excisional biopsy. No evidence for mass and no suspicious microcalcifications. [name] of left axilla dis negative.   Location: [facility].     (11/14/2006)   Mammogram action/deferral: Ordered  (05/04/2009)   Mammogram due: 10/2007   Smoking status: quit  (10/09/2009)  Diabetes Mellitus   HgbA1C: 6.8  (10/09/2009)    Eye exam: No diabetic retinopathy.     (08/19/2009)   Diabetic eye exam action/deferral: Ophthalmology referral  (06/05/2009)   Eye exam due: 08/2010    Foot exam: yes  (05/04/2009)   High risk foot: Not documented   Foot care education: Not documented    Urine microalbumin/creatinine ratio: 17.4  (09/12/2008)  Lipids   Total Cholesterol: 214  (06/05/2009)   Lipid panel action/deferral: Lipid Panel ordered   LDL: 103  (06/05/2009)   LDL Direct: Not documented   HDL: 92  (06/05/2009)   Triglycerides: 95  (06/05/2009)    SGOT (AST): 18  (09/07/2009)   BMP action: Ordered   SGPT (ALT): 12  (09/07/2009)   Alkaline phosphatase: 86  (09/07/2009)   Total bilirubin: 0.9  (09/07/2009)  Hypertension   Last Blood Pressure: 174 / 87  (10/09/2009)   Serum creatinine:  0.71  (09/07/2009)   BMP action: Ordered   Serum potassium 3.7  (09/07/2009)  Self-Management Support :   Personal Goals (by the next clinic visit) :     Personal A1C goal: 7  (04/13/2009)     Personal blood pressure goal: 140/90  (04/13/2009)     Personal LDL goal: 130  (04/13/2009)    Patient will work on the following items until the next clinic visit to reach self-care goals:     Medications and monitoring: take my medicines every day, bring all of my medications to every visit, examine my feet every day  (10/09/2009)     Eating: drink diet soda or water instead of juice or soda, eat more vegetables, use fresh or frozen vegetables, eat baked foods instead of fried foods, eat fruit for snacks and desserts, limit or avoid alcohol  (10/09/2009)     Activity: take a 30 minute walk every day  (10/09/2009)    Diabetes self-management support: Written self-care plan, Education handout, Resources for patients handout  (10/09/2009)   Diabetes care plan printed   Diabetes education handout printed    Hypertension self-management support: Written self-care plan, Education handout, Resources for patients handout  (10/09/2009)   Hypertension self-care plan printed.   Hypertension education handout printed    Lipid self-management support: Written self-care plan, Education handout, Resources for patients handout  (10/09/2009)   Lipid self-care plan printed.   Lipid education handout printed      Resource handout printed.  Laboratory Results   Blood Tests   Date/Time Received: October 09, 2009 4:09 PM Date/Time Reported: Alric Quan  October 09, 2009 4:09 PM   HGBA1C: 6.8%   (Normal Range: Non-Diabetic - 3-6%   Control Diabetic - 6-8%) CBG Random:: 96mg /dL

## 2010-06-01 NOTE — Progress Notes (Signed)
Summary: refill/gg  Phone Note Refill Request  on May 19, 2009 5:15 PM  Refills Requested: Medication #1:  VICODIN 5-500 MG TABS Take one tablet every 6 hours. call when ready. [phone]   Method Requested: Telephone to Pharmacy Initial call taken by: Merrie Roof RN,  May 19, 2009 5:15 PM  Follow-up for Phone Call        denied.  she was given this prescription for a migraine headache.  if the head ache is persistant she must try another treatment strategy or go to a headache specialist.  If this is for another pain she must come in for an evaluation of that pain.

## 2010-06-01 NOTE — Assessment & Plan Note (Signed)
Summary: abd pain/gg   Vital Signs:  Patient profile:   55 year old female Height:      61.5 inches (156.21 cm) Weight:      116.25 pounds (52.84 kg) BMI:     21.69 Temp:     98 degrees F (36.67 degrees C) oral Pulse rate:   74 / minute BP sitting:   148 / 92  (right arm)  Vitals Entered By: Angelina Ok RN (March 16, 2010 2:50 PM) CC: Depression Is Patient Diabetic? Yes Did you bring your meter with you today? No Pain Assessment Patient in pain? yes     Location: lower abdomen Intensity: 10 Type: aching Onset of pain  Constant Nutritional Status BMI of 19 -24 = normal CBG Result 88  Have you ever been in a relationship where you felt threatened, hurt or afraid?No   Does patient need assistance? Functional Status Self care Ambulation Normal Comments Saw GYN.  Has a lot of pressure.  Hurting like menstrual cramps.  Has had a Hysterectomy back in the 70's.  No pain on urination or after eating.    Primary Care Provider:  Johnette Abraham DO  CC:  Depression.  History of Present Illness: Pt is a 55 AAF with PMH of pancreatitis s/p removal of PD stenting in 11/2009, HTN, DM, HTN who came here for abdominal pain and vaginal pressuring. This has been about 3  wks, the abdominal pain is located in mid-abdomina, intermittent, 10/10 worst severity, cramping, no radiation, fever, N/V. Nothing makes it change such as diet, and even tramadol does not help.  She went to GYN 5 days ago and were unremarkable.  She has BM every 3 days. She has no dysuria or diarrhea. Good appetite. No smoking, ETOH or drug abuse.   Depression History:      The patient denies a depressed mood most of the day and a diminished interest in her usual daily activities.         Preventive Screening-Counseling & Management  Alcohol-Tobacco     Alcohol drinks/day: 0     Smoking Status: quit     Year Quit: 2000     Pack years: 30  Pap Smear  Procedure date:  03/11/2010  Findings:      No  malignancy.  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 Take 1 Tablet By Mouth Two Times A Day 2)  Lisinopril 20 Mg Tabs (Lisinopril) .... Take 1 Pill By Mouth Daily. 3)  Aspir-Low 81 Mg Tbec (Aspirin) .... One Tablet Daily 4)  Acetaminophen-Codeine #2 300-15 Mg Tabs (Acetaminophen-Codeine) .Marland Kitchen.. 1 Tab Every 6 Hrs As Needed For A Pain  Current Medications (verified): 1)  Metformin Hcl 1000 Mg Tabs (Metformin Hcl) .... Take 1 Tablet By Mouth Two Times A Day 2)  Lisinopril 20 Mg Tabs (Lisinopril) .... Take 1 Pill By Mouth Daily. 3)  Aspir-Low 81 Mg Tbec (Aspirin) .... One Tablet Daily 4)  Tramadol Hcl 50 Mg Tabs (Tramadol Hcl) .... Take 1 Tablet By Mouth Three Times A Day As Needed  Allergies (verified): 1)  ! Fiorinal (Butalbital-Aspirin-Caffeine) 2)  ! Fioricet  (Butalbital-Apap-Caffeine) 3)  ! 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 Sickle cell in brothers and cousin Grandmother had breast cancer F: prostate CA 11 living M: 22 healthy Sister sickle cell Mother's brother lung CA Mother's sister lung CA  Social History: Reviewed history from 09/23/2008 and no changes required. Lives alone Works at [employer] as a Merchandiser, retail, on Health visitor about 9hours/day former smoker - quit 2001; had smoked 30 years prior that Alcohol use-no Drug use-yes (reports no today) qui smokting 2000  Review of Systems       The patient complains of abdominal pain.  The patient denies fever, weight gain, chest pain, syncope, dyspnea on exertion, peripheral edema, prolonged cough, hemoptysis, melena, and hematochezia.    Physical Exam  General:  alert,  well-developed, well-nourished, and well-hydrated.   Nose:  no nasal discharge.   Mouth:  pharynx pink and moist.   Neck:  supple.   Lungs:  normal respiratory effort, no accessory muscle use, normal breath sounds, no crackles, and no wheezes.   Heart:  normal rate, regular rhythm, and no JVD.   Abdomen:  Left mid-abdominal tenderness. soft, normal bowel sounds, no distention, no masses, no guarding, no rigidity, and no rebound tenderness.   Msk:  normal ROM, no joint tenderness, no joint swelling, and no joint warmth.   Extremities:  No edema. Neurologic:  alert & oriented X3, cranial nerves II-XII intact, strength normal in all extremities, and gait normal.     Impression & Recommendations:  Problem # 1:  ABDOMINAL PAIN (ICD-789.00) Assessment Unchanged Her pain severity is not appropriate with the physical exam. She said this is different from her pancreatitis  symptoms. This has been 3 weeks and had r/o GYN cause from her recent unremarkable GYN exam. I have discussed with Dr. Rogelia Boga who took care of her this May and we are not clear for the cause of her abdominal pain. This may be due to pancreatitis, UTI, gastroenteritis, narcotics seeking behavior, IBS, or ileus. We will start to check her lab including CBC, CMET, lipase and UA with UCx. Also gives her bentyl to see if this may help her pain. If symptoms persistent and has abnormal labs, may need CT of abdomen and pelvis to r/o abdominal disorders such as pancreatic pseudocyst/pancreatitis.  Orders: T-CMP with Estimated GFR ([id number]) T-CBC No Diff ([id number]) T-Urinalysis ([id number]) T-Culture, Urine ([id number]) T-Lipase ([id number])  Problem # 2:  DIABETES MELLITUS, TYPE II (ICD-250.00) Assessment: Unchanged DM well controlled and at the goal. Continue metformin.  Her updated medication list for this problem includes:    Metformin Hcl 1000 Mg Tabs (Metformin hcl) .Marland Kitchen... Take 1 tablet by mouth two times a day     Lisinopril 20 Mg Tabs (Lisinopril) .Marland Kitchen... Take 1 pill by mouth daily.    Aspir-low 81 Mg Tbec (Aspirin) ..... One tablet daily  Orders: Capillary Blood Glucose/CBG (63875) T-Urine Microalbumin w/creat. ratio [phone])  Labs Reviewed: Creat: 0.68 (11/23/2009)     Last Eye Exam: No diabetic retinopathy.    (08/19/2009) Reviewed HgBA1c results: 6.5 (02/08/2010)  6.8 (10/09/2009)  Problem # 3:  HYPERTENSION (ICD-401.9) Assessment: Improved Bp is better than before. Will continue current meds.  Her updated medication list for this problem includes:    Lisinopril 20 Mg Tabs (Lisinopril) .Marland Kitchen... Take 1 pill by mouth daily.  BP today: 148/92 Prior BP: 168/87 (02/08/2010)  Prior 10 Yr Risk Heart Disease: 15 % (11/05/2009)  Labs Reviewed: K+: 3.6 (11/23/2009) Creat: : 0.68 (11/23/2009)   Chol: 214 (06/05/2009)   HDL: 92 (06/05/2009)   LDL: 103 (06/05/2009)   TG: 95 (06/05/2009)  Problem # 4:  HYPERLIPIDEMIA (ICD-272.4) Assessment: Unchanged Well controlled and not on any meds for it. Labs Reviewed: SGOT: 18 (09/07/2009)   SGPT: 12 (09/07/2009)  Lipid Goals: Chol Goal: 200 (11/05/2009)   HDL Goal: 40 (11/05/2009)   LDL Goal: 100 (11/05/2009)   TG Goal: 150 (11/05/2009)  Prior 10 Yr Risk Heart Disease: 15 % (11/05/2009)   HDL:92 (06/05/2009), 119 (10/30/1599)  LDL:103 (06/05/2009), 105 (03/21/2008)  Chol:214 (06/05/2009), 248 (03/21/2008)  Trig:95 (06/05/2009), 122 (03/21/2008)  Problem # 5:  PANCREATITIS, ACUTE, HX OF (ICD-V12.70) Assessment: Comment Only This is likely due to pancreatic divisum. Details see problem #1. Orders: T-Lipase ([id number])  Complete Medication List: 1)  Metformin Hcl 1000 Mg Tabs (Metformin hcl) .... Take 1 tablet by mouth two times a day 2)  Lisinopril 20 Mg Tabs (Lisinopril) .... Take 1 pill by mouth daily. 3)  Aspir-low 81 Mg Tbec (Aspirin) .... One tablet daily 4)  Tramadol Hcl 50 Mg Tabs (Tramadol hcl) .... Take 1 tablet by mouth three  times a day as needed 5)  Bentyl 20 Mg Tabs (Dicyclomine hcl) .... Take 1 tablet by mouth four times a day as needed for pain  Other Orders: Influenza Vaccine MCR (20254) Mammogram (Screening) (Mammo)  Patient Instructions: 1)  Please schedule a follow-up appointment in 2-3 weeks. 2)  Will call you if any abnormal labs. 3)  If ypour pain gets worse, you can come to Clinic or ED.  Prescriptions: BENTYL 20 MG TABS (DICYCLOMINE HCL) Take 1 tablet by mouth four times a day as needed for pain  #  40 x 1   Entered and Authorized by:   Jackson Latino MD   Signed by:   Jackson Latino MD on 03/16/2010   Method used:   Electronically to        [facility] [id number]* (retail)       [address] [place], [place]  [address]       Ph: [phone]       Fax: [phone]   RxID:   [id number]    Orders Added: 1)  Capillary Blood Glucose/CBG [44010] 2)  T-Urine Microalbumin w/creat. ratio [[id number]] 3)  T-CMP with Estimated GFR [[id number]] 4)  T-CBC No Diff [[id number]] 5)  T-Urinalysis [81003-65000] 6)  T-Culture, Urine [[id number]] 7)  Influenza Vaccine MCR [00025] 8)  Mammogram (Screening) [Mammo] 9)  T-Lipase [[id number]] 10)  Est. Patient Level IV [34742]   Immunizations Administered:  Influenza Vaccine # 1:    Vaccine Type: Fluvax MCR    Site: right deltoid    Mfr: GlaxoSmithKline    Dose: 0.5 ml    Route: IM    Given by: Angelina Ok RN    Exp. Date: 10/30/2010    Lot #: [id number]    VIS given: 11/24/09 version given March 16, 2010.  Flu Vaccine Consent Questions:    Do you have a history of severe allergic reactions to this vaccine? no    Any prior history of allergic reactions to egg and/or gelatin? no    Do you have a sensitivity to the preservative Thimersol? no    Do you have a past history of Guillan-Barre Syndrome? no    Do you currently have an acute febrile illness? no    Have you ever had a severe reaction to latex? no     Vaccine information given and explained to patient? yes    Are you currently pregnant? no   Immunizations Administered:  Influenza Vaccine # 1:    Vaccine Type: Fluvax MCR    Site: right deltoid    Mfr: GlaxoSmithKline    Dose: 0.5 ml    Route: IM    Given by: Angelina Ok RN    Exp. Date: 10/30/2010    Lot #: [id number]    VIS given: 11/24/09 version given March 16, 2010.  Prevention & Chronic Care Immunizations   Influenza vaccine: Fluvax MCR  (03/16/2010)   Influenza vaccine deferral: Deferred  (11/23/2009)    Tetanus booster: Not documented   Td booster deferral: Deferred  (11/23/2009)    Pneumococcal vaccine: Not documented  Colorectal Screening   Hemoccult: Not documented   Hemoccult action/deferral: Refused  (04/13/2009)    Colonoscopy: Normal  (02/27/2004)   Colonoscopy due: 02/06/2014  Other Screening   Pap smear: No malignancy.  (03/11/2010)   Pap smear action/deferral: Deferred  (11/23/2009)    Mammogram: Assessment: BIRADS 2.   L breast US and digital bilateral diagnostic mammogram. Scarring changes w/in aubareolar portion of the left breast related to previous excisional biopsy. No evidence for mass and no suspicious microcalcifications. [name] of left axilla dis negative.   Location: [facility]     (11/14/2006)   Mammogram action/deferral: Ordered  (03/16/2010)   Mammogram due: 10/2007   Smoking status: quit  (03/16/2010)  Diabetes Mellitus   HgbA1C: 6.5  (02/08/2010)    Eye exam: No diabetic retinopathy.     (08/19/2009)   Diabetic eye exam action/deferral: Ophthalmology referral  (06/05/2009)   Eye exam due: 08/2010  Foot exam: yes  (05/04/2009)   High risk foot: Not documented   Foot care education: Not documented    Urine microalbumin/creatinine ratio: 17.4  (09/12/2008)   Urine microalbumin action/deferral: Ordered    Diabetes flowsheet reviewed?: Yes   Progress toward A1C goal: At goal  Lipids   Total  Cholesterol: 214  (06/05/2009)   Lipid panel action/deferral: Lipid Panel ordered   LDL: 103  (06/05/2009)   LDL Direct: Not documented   HDL: 92  (06/05/2009)   Triglycerides: 95  (06/05/2009)    SGOT (AST): 18  (09/07/2009)   BMP action: Ordered   SGPT (ALT): 12  (09/07/2009)   Alkaline phosphatase: 86  (09/07/2009)   Total bilirubin: 0.9  (09/07/2009)    Lipid flowsheet reviewed?: Yes   Progress toward LDL goal: Unchanged  Hypertension   Last Blood Pressure: 148 / 92  (03/16/2010)   Serum creatinine: 0.68  (11/23/2009)   BMP action: Ordered   Serum potassium 3.6  (11/23/2009)    Hypertension flowsheet reviewed?: Yes   Progress toward BP goal: Improved  Self-Management Support :   Personal Goals (by the next clinic visit) :     Personal A1C goal: 7  (04/13/2009)     Personal blood pressure goal: 140/90  (04/13/2009)     Personal LDL goal: 130  (04/13/2009)    Patient will work on the following items until the next clinic visit to reach self-care goals:     Medications and monitoring: take my medicines every day, bring all of my medications to every visit  (03/16/2010)     Eating: drink diet soda or water instead of juice or soda, eat more vegetables, use fresh or frozen vegetables, eat foods that are low in salt, eat baked foods instead of fried foods, eat fruit for snacks and desserts, limit or avoid alcohol  (03/16/2010)     Activity: take a 30 minute walk every day  (03/16/2010)    Diabetes self-management support: Written self-care plan, Education handout, Pre-printed educational material, Resources for patients handout  (03/16/2010)   Diabetes care plan printed   Diabetes education handout printed    Hypertension self-management support: Written self-care plan, Education handout, Pre-printed educational material, Resources for patients handout  (03/16/2010)   Hypertension self-care plan printed.   Hypertension education handout printed    Lipid self-management  support: Written self-care plan, Education handout, Pre-printed educational material, Resources for patients handout  (03/16/2010)   Lipid self-care plan printed.   Lipid education handout printed      Resource handout printed.   Nursing Instructions: Give Flu vaccine today Schedule screening mammogram (see order)    Process Orders Check Orders Results:     [facility]: ABN not required for this insurance Tests Sent for requisitioning (March 16, 2010 7:47 PM):     03/16/2010: [facility] -- T-Urine Microalbumin w/creat. ratio [[id number]] (signed)     03/16/2010: [facility] -- T-CMP with Estimated GFR [[id number]] (signed)     03/16/2010: [facility] -- T-CBC No Diff [[id number]] (signed)     03/16/2010: [facility] -- T-Urinalysis [[id number]] (signed)     03/16/2010: [facility] -- T-Culture, Urine [[id number]] (signed)     03/16/2010: [facility] -- T-Lipase [phone] (signed)

## 2010-06-01 NOTE — Assessment & Plan Note (Signed)
Summary: CHECKUP/HFU/SB.   Vital Signs:  Patient profile:   55 year old female Height:      61.5 inches (156.21 cm) Weight:      117.7 pounds (53.50 kg) BMI:     21.96 Temp:     97.6 degrees F (36.44 degrees C) oral Pulse rate:   91 / minute BP sitting:   141 / 95  (right arm)  Vitals Entered By: Stanton Kidney Ditzler RN (May 04, 2009 11:53 AM) CC: Headache Is Patient Diabetic? Yes Did you bring your meter with you today? No Pain Assessment Patient in pain? yes     Location: h/a Intensity: 10 Onset of pain  since 05/02/09 Nutritional Status BMI of 19 -24 = normal Nutritional Status Detail appetite down CBG Result 117  Have you ever been in a relationship where you felt threatened, hurt or afraid?denies   Does patient need assistance? Functional Status Self care Ambulation Normal Comments H/a since 05/02/09 - had Tylenol with codeine - no help. HFU.   Primary Care Provider:  Elby Showers MD  CC:  Headache.  History of Present Illness: This is a 55 year old woman with past medical history of DM, HTN, HLD, PE, sickle cell trait, beta thal, and migraine.  She is here for hosptial follow up.  She was discharged about 2 weeks ago after hosp for ACS rule out with chest pain. She also seemed to have a viral URI at that time.    She is feeling much better from that stand point.  Her URI symptoms have completely resolved.  She still has some tenderness in her left chest wall that is not exertional.  Today she presents with migraine headache pain is 10/10.  She has had the pain for 2 days now.  She is light and sound sensitive.  She gets these every 6 months or so.     Depression History:      The patient denies a depressed mood most of the day and a diminished interest in her usual daily activities.         Headache HPI:      The headaches will last anywhere from 2 hours to 3 days at a time.  She has approximately 0 headaches per month.  Headaches have been occurring since age 44.         On a scale of 1-10, she describes the headache as a 10.  The headaches are associated with an aura.  The location of the headaches are unilateral-left.  Headache quality is throbbing or pulsating.  The headaches are associated with nausea, photophobia, and phonophobia.         Preventive Screening-Counseling & Management  Alcohol-Tobacco     Alcohol drinks/day: 0     Smoking Status: quit     Year Quit: 2000     Pack years: 30  Caffeine-Diet-Exercise     Does Patient Exercise: yes     Type of exercise: FLOOR EXERCISE     Times/week: 2  Current Medications (verified): 1)  Metformin Hcl 1000 Mg Tabs (Metformin Hcl) .... Take 1 Tablet By Mouth Two Times A Day 2)  Prinivil 10 Mg Tabs (Lisinopril) .... One Tablet By Mouth Daily  Allergies: 1)  ! Fiorinal (Butalbital-Aspirin-Caffeine) 2)  ! Fioricet (Butalbital-Apap-Caffeine) 3)  ! Sulfa  Review of Systems       per hpi  Physical Exam  General:  alert and well-developed.   Head:  normocephalic and atraumatic.   Eyes:  refused. Mouth:  refused. Lungs:  normal respiratory effort and normal breath sounds.   Heart:  tachy, regular, no murmur. Neurologic:  alert & oriented X3 and gait normal.    Diabetes Management Exam:    Foot Exam (with socks and/or shoes not present):       Sensory-Monofilament:          Left foot: normal          Right foot: normal   Impression & Recommendations:  Problem # 1:  MIGRAINE HEADACHE (ICD-346.90) Symptoms seem mild, she is animated, moving comfortably though she sits in a dark room.  She says that she can not take imitrex first said it makes her nauseated, then said it makes her throat itch.  she used to take darvocet, but it is no longer on the market.  She has been taking tylenol with codiene with no relief.  She was insistant on recieving a medication for relief but did not accept any of my suggestions other than vicodin.  I have to admit that I was uncomfortable with her reluctance to  take any non-narcotics, but it seems useless to prescribe anything that she will refuse to take. She agreed to 15 tablets of vicodin.  The following medications were removed from the medication list:    Aspir-low 81 Mg Tbec (Aspirin) .Marland Kitchen... Take 1 tablet by mouth once a day Her updated medication list for this problem includes:    Vicodin 5-500 Mg Tabs (Hydrocodone-acetaminophen) .Marland Kitchen... Take one tablet every 6 hours.  Problem # 2:  CHEST PAIN, PLEURITIC (ICD-786.52) still with some chest soreness, but decrease.  no respiratory symptoms.  The following medications were removed from the medication list:    Aspir-low 81 Mg Tbec (Aspirin) .Marland Kitchen... Take 1 tablet by mouth once a day  Complete Medication List: 1)  Metformin Hcl 1000 Mg Tabs (Metformin hcl) .... Take 1 tablet by mouth two times a day 2)  Prinivil 10 Mg Tabs (Lisinopril) .... One tablet by mouth daily 3)  Vicodin 5-500 Mg Tabs (Hydrocodone-acetaminophen) .... Take one tablet every 6 hours. 4)  Zofran 4 Mg Tabs (Ondansetron hcl) .... Take one tablet every 4 hours as needed for nausea.  Other Orders: Capillary Blood Glucose/CBG (21308) Mammogram (Screening) (Mammo)  Headache Assessment/Plan:    Headache Classification:  Migraine with aura Prescriptions: ZOFRAN 4 MG TABS (ONDANSETRON HCL) Take one tablet every 4 hours as needed for nausea.  #15 x 0   Entered and Authorized by:   Elby Showers MD   Signed by:   Elby Showers MD on 05/04/2009   Method used:   Print then Give to Patient   RxID:   [id number] VICODIN 5-500 MG TABS (HYDROCODONE-ACETAMINOPHEN) Take one tablet every 6 hours.  #15 x 0   Entered and Authorized by:   Elby Showers MD   Signed by:   Elby Showers MD on 05/04/2009   Method used:   Print then Give to Patient   RxID:   [id number]    Prevention & Chronic Care Immunizations   Influenza vaccine: Fluvax Non-MCR  (01/21/2009)    Tetanus booster: Not documented   Td booster deferral:  Deferred  (04/13/2009)    Pneumococcal vaccine: Not documented  Colorectal Screening   Hemoccult: Not documented   Hemoccult action/deferral: Refused  (04/13/2009)    Colonoscopy: Normal  (02/27/2004)   Colonoscopy due: 02/06/2014  Other Screening   Pap smear: Specimen Adequacy: Satisfactory for evaluation.   Interpretation/Result:Negative for intraepithelial Lesion or Malignancy.     (  09/23/2008)    Mammogram: Assessment: BIRADS 2.   L breast US and digital bilateral diagnostic mammogram. Scarring changes w/in aubareolar portion of the left breast related to previous excisional biopsy. No evidence for mass and no suspicious microcalcifications. [name] of left axilla dis negative.   Location: [facility].     (11/14/2006)   Mammogram action/deferral: Ordered  (05/04/2009)   Mammogram due: 10/2007   Smoking status: quit  (05/04/2009)  Diabetes Mellitus   HgbA1C: 7.7  (04/20/2009)    Eye exam: Not documented    Foot exam: yes  (05/04/2009)   High risk foot: Not documented   Foot care education: Not documented    Urine microalbumin/creatinine ratio: 17.4  (09/12/2008)    Diabetes flowsheet reviewed?: Yes   Progress toward A1C goal: Unchanged  Lipids   Total Cholesterol: 248  (03/21/2008)   Lipid panel action/deferral: Deferred   LDL: 105  (03/21/2008)   LDL Direct: Not documented   HDL: 119  (03/21/2008)   Triglycerides: 122  (03/21/2008)    SGOT (AST): 26  (10/07/2008)   SGPT (ALT): 18  (10/07/2008)   Alkaline phosphatase: 85  (10/07/2008)   Total bilirubin: 0.4  (10/07/2008)    Lipid flowsheet reviewed?: Yes   Progress toward LDL goal: Unchanged  Hypertension   Last Blood Pressure: 141 / 95  (05/04/2009)   Serum creatinine: 0.73  (01/21/2009)   Serum potassium 4.2  (01/21/2009)    Hypertension flowsheet reviewed?: Yes   Progress toward BP goal: Improved  Self-Management Support :   Personal Goals (by the next clinic visit) :      Personal A1C goal: 7  (04/13/2009)     Personal blood pressure goal: 140/90  (04/13/2009)     Personal LDL goal: 130  (04/13/2009)    Patient will work on the following items until the next clinic visit to reach self-care goals:     Medications and monitoring: take my medicines every day, examine my feet every day  (05/04/2009)     Eating: drink diet soda or water instead of juice or soda, eat more vegetables, use fresh or frozen vegetables, eat baked foods instead of fried foods, eat fruit for snacks and desserts, limit or avoid alcohol  (05/04/2009)     Activity: take a 30 minute walk every day, take the stairs instead of the elevator, park at the far end of the parking lot  (05/04/2009)    Diabetes self-management support: Written self-care plan  (04/13/2009)    Hypertension self-management support: Written self-care plan  (04/13/2009)    Lipid self-management support: Written self-care plan  (04/13/2009)    Nursing Instructions: Schedule screening mammogram (see order)    Last LDL:                                                 105 (03/21/2008 9:47:00 PM)        Diabetic Foot Exam Foot Inspection Is there a history of a foot ulcer?              No Is there a foot ulcer now?              No Can the patient see the bottom of their feet?          Yes Are the shoes appropriate in style and fit?  Yes Is there swelling or an abnormal foot shape?          No Are the toenails long?                No Are the toenails thick?                No Are the toenails ingrown?              No Is there heavy callous build-up?              No Is there a claw toe deformity?                          No Is there elevated skin temperature?            No Is there limited ankle dorsiflexion?            No Is there foot or ankle muscle weakness?            No Do you have pain in calf while walking?           No         10-g (5.07) Semmes-Weinstein Monofilament Test Performed by:  Stanton Kidney Ditzler RN          Right Foot          Left Foot Visual Inspection     normal         normal Test Control      normal         normal Site 1         normal         normal Site 2         normal         normal Site 3         normal         normal Site 4         normal         normal Site 5         normal         normal Site 6         normal         normal Site 7         normal         normal Site 8         normal         normal Site 9         normal         normal Site 10         normal         normal  Impression      normal         normal

## 2010-06-01 NOTE — Progress Notes (Signed)
Summary: pain, adm/hla  Phone Note Call from Patient   Summary of Call: Pt calls to say she is having pain from her pancreatic surgery and must have another surg, scheduled for next week. pt states she is having severe pain and was told she would until the surgery, was told to call her pcp. appt is scheduled Initial call taken by: Marin Roberts RN,  November 23, 2009 12:26 PM  Follow-up for Phone Call        patient seen. Follow-up by: Lars Mage MD,  November 24, 2009 3:46 PM

## 2010-06-03 NOTE — Procedures (Signed)
Summary: Colon  Patient Name: [name], Baroni MRN: [id number] Procedure Procedures: Colonoscopy CPT: 84696.  Personnel: Endoscopist: Vania Rea. Jarold Motto, MD.  Exam Location: Exam performed in Endoscopy Suite.  Patient Consent: Procedure, Alternatives, Risks and Benefits discussed, consent obtained,  Indications Symptoms: Abdominal pain / bloating.  History  Current Medications: Patient is not currently taking Coumadin.  Pre-Exam Physical: Performed Feb 18, 2003. Cardio-pulmonary exam, Rectal exam, Abdominal exam, Extremity exam, Mental status exam WNL.  Exam Exam: Extent of exam reached: Cecum, extent intended: Cecum.  The cecum was identified by appendiceal orifice and IC valve. Patient position: left side to back. Duration of exam: 30 minutes. Colon retroflexion performed. Images taken. ASA Classification: I. Tolerance: good.  Monitoring: Pulse and BP monitoring, Oximetry used. Supplemental O2 given. at 2 Liters.  Colon Prep Used Golytely for colon prep. Prep results: excellent.  Fluoroscopy: Fluoroscopy was not used.  Sedation Meds: Fentanyl 87.5 given IV. Versed 8 mg. given IV.  Instrument(s): PCF 100. Serial [id number].  Findings - NORMAL EXAM: Cecum to Rectum. Not Seen: Polyps. AVM's. Colitis. Tumors. Melanosis. Crohn's. Diverticulosis. Hemorrhoids.   Assessment Normal examination.  Comments: Probable functional pain here--abdominal pain way out of proportion to any objective findings. Events  Unplanned Interventions: No intervention was required.  Plans Medication Plan: Continue current medications.  Patient Education: Patient given standard instructions for: high fiber diet.  Disposition: After procedure patient sent to recovery.

## 2010-06-03 NOTE — Miscellaneous (Signed)
Summary: Hospital Admission 05/25/10  INTERNAL MEDICINE ADMISSION HISTORY AND PHYSICAL R1 Dr. Loistine Chance [phone] R3 Dr. Sherryll Burger [phone] PCP:Dr. Myrtie Neither XB:[id number] pain  HPI: 55 years old female with PMH as detailed below called the [facility] with request for evaluation of pain yesterday afternoon. She was asked to visit ED as no appointment was available. She tried to "ride out" the pain but could not tolerate it in the morning and went to ED. her pain is located in left upper quadrant and rediates to back. It is graded 10/10 at worst and associated with nausea. She reports releif with IV pain medications. She denies recurrent vomiting, diarrhea, fevers and chills. She denies alcohol intake.   ALLERGIES: ! FIORINAL [facility])- Coma ! FIORICET [facility]) ! 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 Pancreas divisum documented in 2002 by magnetic resonance pancreatocholangiography. 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 Status post bilateral lumpectomy. Breast biopsy benign status post bilateral breast implants.  MEDICATIONS:  METFORMIN HCL 500 MG TABS (METFORMIN HCL) take 1 tab by mouth two times a day LISINOPRIL 20 MG TABS (LISINOPRIL) take 1 pill by mouth daily. VICODIN 5-500 MG TABS (HYDROCODONE-ACETAMINOPHEN) Take 1 tablet by mouth three  times a day as needed for pain PANTOPRAZOLE SODIUM 40 MG TBEC (PANTOPRAZOLE SODIUM) Take 1 tab daily LOPERAMIDE HCL 2 MG TABS (LOPERAMIDE HCL) Take 1 tab as needed for diarrhea- not more than 6 tablets a day   SOCIAL HISTORY: Social History: Lives alone Works at [employer] as a Merchandiser, retail, on Health visitor about 9hours/day former smoker - quit 2001; had smoked 30 years prior that Alcohol use-no Drug use-yes (reports no today) qui smokting 2000   Substance use: Insurance: FAMILY HISTORY Family History: Sickle cell in brothers and cousin Grandmother had breast cancer F: prostate CA 63 living M: 12 healthy Sister sickle cell Mother's brother lung CA Mother's sister lung CA   ROS: per HPI  VITALS: T: 97.6 P:72  BP:143/82  R:20  O2SAT:95  ON:RA PHYSICAL EXAM: General:  alert, well-developed, and cooperative to examination.minimal distress   Head:  normocephalic and atraumatic.   Eyes:  vision grossly intact, pupils equal, pupils round, pupils reactive to light, no injection and anicteric.   Mouth:  pharynx and mucosal membrane are dry, no erythema, and no exudates.   Neck:  supple, full ROM, no thyromegaly, no JVD, and no carotid bruits.   Lungs:  normal respiratory effort, no accessory muscle use, normal breath sounds, no crackles, and no wheezes.  Heart:  normal rate, regular rhythm, no murmur, no gallop, and no rub.   Abdomen:  soft,mildly tender in left upper and lower quadrants, normal bowel sounds, no distention, voluntary guarding, no rebound tenderness, no hepatomegaly, and no splenomegaly.   Msk:  no joint swelling, no joint warmth, and no redness over joints.   Pulses:  2+ DP/PT pulses bilaterally Extremities:  No cyanosis, clubbing, edema  Neurologic:  alert & oriented X3, cranial nerves II-XII intact, strength normal in all extremities, sensation  intact to light touch, and gait normal.   Skin:  turgor normal and no rashes.   Psych:  Oriented X3, memory intact for recent  and remote, normally interactive, good eye contact, not anxious appearing, and not depressed appearing.   LABS:  WBC                                      6.7               4.0-10.5         K/uL  RBC                                      4.67              3.87-5.11        MIL/uL  Hemoglobin (HGB)                         12.5              12.0-15.0        g/dL  Hematocrit (HCT)                         36.0              36.0-46.0        %  MCV                                      77.1       l      78.0-100.0       fL  MCH -                                    26.8              26.0-34.0        pg  MCHC                                     34.7              30.0-36.0        g/dL  RDW                                      14.8              11.5-15.5        %  Platelet Count (PLT)                     363               150-400          K/uL  Neutrophils, %                           46  43-77            %  Lymphocytes, %                           44                12-46            %  Monocytes, %                             8                 3-12             %  Eosinophils, %                           1                 0-5              %  Basophils, %                             0                 0-1              %  Neutrophils, Absolute                    3.1               1.7-7.7          K/uL  Lymphocytes, Absolute                    3.0               0.7-4.0          K/uL  Monocytes, Absolute                      0.6               0.1-1.0          K/uL  Eosinophils, Absolute                    0.1               0.0-0.7          K/uL  Basophils, Absolute                      0.0               0.0-0.1          K/uL Sodium (NA)                              141               135-145          mEq/L  Potassium (K)                            4.1  3.5-5.1          mEq/L  Chloride                                 105               96-112           mEq/L  CO2                                       26                19-32            mEq/L  Glucose                                  132        h      70-99            mg/dL  BUN                                      5          l      6-23             mg/dL  Creatinine                               0.71              0.4-1.2          mg/dL  GFR, Est Non African American            >60               >60              mL/min  GFR, Est African American                >60               >60              mL/min    Oversized comment, see footnote  1  Bilirubin, Total                         1.0               0.3-1.2          mg/dL  Alkaline Phosphatase                     61                39-117           U/L  SGOT (AST)                               19                0-37  U/L  SGPT (ALT)                               12                0-35             U/L  Total  Protein                           8.3               6.0-8.3          g/dL  Albumin-Blood                            4.2               3.5-5.2          g/dL  Calcium                                  10.1              8.4-10.5         mg/dL   Squamous Epithelial / LPF                RARE              RARE  WBC / HPF                                0-2               <3               WBC/hpf  Bacteria / HPF                           RARE              RARE   Color, Urine                             YELLOW            YELLOW  Appearance                               CLOUDY     a      CLEAR  Specific Gravity                         1.011             1.005-1.030  pH                                       7.5               5.0-8.0  Urine Glucose                            NEGATIVE  NEG              mg/dL  Bilirubin                                NEGATIVE          NEG  Ketones                                  NEGATIVE          NEG              mg/dL  Blood                                    NEGATIVE          NEG  Protein                                  NEGATIVE           NEG              mg/dL  Urobilinogen                             0.2               0.0-1.0          mg/dL  Nitrite                                  NEGATIVE          NEG  Leukocytes                               TRACE      a      NEG   Lipase                                   36                11-59            U/L  ASSESSMENT AND PLAN: 1. Abdominal pain: patient has prolonged history of reported pancreatitis since 1995. She had MRI in 2002 which revealed pancreatic divisum- a common congenital anomaly but no inflammation. she had two admissions in recent times with similar complain. She was referred to the [facility] for possible stenting as CT scan in May 2011 revealed dilated main duct. The stent was placed but later on had to be removed due to migration. She has not returned to them in recent times. Her lipase as always in the past instances, is within normal limits. Her pain is significant and like her previous episodes.   We will keep her NPO, treat her pain and nausea symptomatically. She has appointment with [name] on Thursday, and we might be able to discharge her tomorrow to keep that appointment. Lastly, she has multiple other pain symptoms (i.e joint pain, chest pain, pelvic pain etc) in the past. So  this could be functional abdominal pain related to chronic pain syndrome.   2. Her other chronic problems are well controlled at this time and since she is NPO we will hold her other medicines.  3. VTE PROPH: lovenox

## 2010-06-03 NOTE — Letter (Signed)
Summary: Results Letter   Gastroenterology  [address] [place], [place] [address]   Phone: [phone]  Fax: [phone]        April 20, 2010 MRN: [id number]    [facility] [address] [place], [place]  [address]    Dear [name],      Patient has an upcoming appointment with Dr Stan Head in our office nextweek.  Patient was seen in June in your Gastroenterology department and an ERCP also performed around the same time.  Please fax any GI notes from June 2011 and ERCP to [phone]. Sincerely,      Darcey Nora RN, CGRN  This letter has been electronically signed by your physician.

## 2010-06-03 NOTE — Miscellaneous (Signed)
Summary: PROTECTED HEALTH INFORMATION  PROTECTED HEALTH INFORMATION   Imported By: Margie Billet 04/20/2010 10:27:14  _____________________________________________________________________  External Attachment:    Type:   Image     Comment:   External Document

## 2010-06-03 NOTE — Discharge Summary (Signed)
Summary: Hospital Discharge Update    Hospital Discharge Update:  Date of Admission: 04/14/2010 Date of Discharge: 04/15/2010  Brief Summary:  Pt was admitted from clinic w/ abdominla painand diarrhea. Pt has Pancreatic Divisum and also has recent attacks of actue pancreatitis. This admission, lipase was normal and CT abd/pelvis done on 04/08/10 showed no acute changes. Pt had pancreatic stent removed on 12/02/09. She is having chronic diarrhea, which gets relieved with Immodium. Tried to make an appt for pt with [facility], but she was discharged from there practice in 2001- so was not able to set an appt. So made an appt with [name], [facility], on 05/27/10 at 9 am( thats earliest out pt appt for her in [place] for now- and she doesn't want to travel to [place]).  Lab or other results pending at discharge:  Stool Studies with C.diff  Medication list changes:  Added new medication of PANTOPRAZOLE SODIUM 40 MG TBEC (PANTOPRAZOLE SODIUM) Take 1 tab daily - Signed Added new medication of LOPERAMIDE HCL 2 MG TABS (LOPERAMIDE HCL) Take 1 tab as needed for diarrhea- not more than 6 tablets a day - Signed Rx of PANTOPRAZOLE SODIUM 40 MG TBEC (PANTOPRAZOLE SODIUM) Take 1 tab daily;  #30 x 5;  Signed;  Entered by: Lyn Hollingshead;  Authorized by: Lyn Hollingshead;  Method used: Electronically to [facility] [id number]*, [address], [place], [place]  [address], Ph: [phone], Fax: [phone] Rx of LOPERAMIDE HCL 2 MG TABS (LOPERAMIDE HCL) Take 1 tab as needed for diarrhea- not more than 6 tablets a day;  #30 x 2;  Signed;  Entered by: Lyn Hollingshead;  Authorized by: Lyn Hollingshead;  Method used: Electronically to [facility] [id number]*, [address] [place], [place]  [address], Ph: [phone], Fax: [phone]  Discharge medications:  METFORMIN HCL 1000 MG TABS (METFORMIN HCL) Take 1 tablet by mouth two times a day LISINOPRIL 20 MG TABS (LISINOPRIL) take 1 pill by mouth daily. VICODIN 5-500  MG TABS (HYDROCODONE-ACETAMINOPHEN) Take 1 tablet by mouth three times a day as needed for pain PANTOPRAZOLE SODIUM 40 MG TBEC (PANTOPRAZOLE SODIUM) Take 1 tab daily LOPERAMIDE HCL 2 MG TABS (LOPERAMIDE HCL) Take 1 tab as needed for diarrhea- not more than 6 tablets a day  Other patient instructions:  You have an Appointment with [name] - with [facility] on 05/27/2010 at 9.00 am. Please make to that appointment- if you have to cancel it- please call them before 24 hrs of appt. time.   Note: Hospital Discharge Medications & Other Instructions handout was printed, one copy for patient and a second copy to be placed in hospital chart.    Complete Medication List: 1)  Metformin Hcl 1000 Mg Tabs (Metformin hcl) .... Take 1 tablet by mouth two times a day 2)  Lisinopril 20 Mg Tabs (Lisinopril) .... Take 1 pill by mouth daily. 3)  Vicodin 5-500 Mg Tabs (Hydrocodone-acetaminophen) .... Take 1 tablet by mouth three times a day as needed for pain 4)  Pantoprazole Sodium 40 Mg Tbec (Pantoprazole sodium) .... Take 1 tab daily 5)  Loperamide Hcl 2 Mg Tabs (Loperamide hcl) .... Take 1 tab as needed for diarrhea- not more than 6 tablets a day  Prescriptions: LOPERAMIDE HCL 2 MG TABS (LOPERAMIDE HCL) Take 1 tab as needed for diarrhea- not more than 6 tablets a day  #30 x 2   Entered and Authorized by:   Lyn Hollingshead   Signed by:  Lyn Hollingshead on 04/15/2010   Method used:   Electronically to        [facility] [id number]* (retail)       [address] [place], [place]  [address]       Ph: [phone]       Fax: [phone]   RxID:   [id number] PANTOPRAZOLE SODIUM 40 MG TBEC (PANTOPRAZOLE SODIUM) Take 1 tab daily  #30 x 5   Entered and Authorized by:   Lyn Hollingshead   Signed by:   Lyn Hollingshead on 04/15/2010   Method used:   Electronically to        [facility] [id number]* (retail)       [address] [place], [place]  [address]       Ph: [phone]        Fax: [phone]   RxID:   [id number]

## 2010-06-03 NOTE — Progress Notes (Addendum)
Summary: refill/ [facility]la  Phone Note Refill Request Message from:  Patient on May 19, 2010 2:20 PM  Refills Requested: Medication #1:  VICODIN 5-500 MG TABS Take 1 tablet by mouth three times a day as needed for pain   Dosage confirmed as above?Dosage Confirmed   Last Refilled: 1/4 Initial call taken by: Marin Roberts RN,  May 19, 2010 2:20 PM  Follow-up for Phone Call        Patient should be seen and evaluated if she is still having pain. Follow-up by: Margarito Liner MD,  May 21, 2010 3:48 PM  Additional Follow-up for Phone Call Additional follow up Details #1::        Pt called again and was advised to go to ED as we have no appointments this WEEK. Patient/caller verbalizes understanding of these instructions.

## 2010-06-03 NOTE — Letter (Signed)
Summary: [facility]  [facility]   Imported By: Sherian Rein 05/07/2010 14:06:33  _____________________________________________________________________  External Attachment:    Type:   Image     Comment:   External Document

## 2010-06-03 NOTE — Procedures (Signed)
Summary: [facility]   Imported By: Sherian Rein 05/07/2010 14:05:28  _____________________________________________________________________  External Attachment:    Type:   Image     Comment:   External Document

## 2010-06-03 NOTE — Assessment & Plan Note (Signed)
Summary: ACUTE-TO GO OVER TEST RESULTS/CFB([facility])   Vital Signs:  Patient profile:   55 year old female Height:      61.5 inches (156.21 cm) Weight:      117.6 pounds (53.45 kg) BMI:     21.94 Temp:     98.0 degrees F (36.67 degrees C) oral Pulse rate:   80 / minute BP sitting:   155 / 83  (left arm)  Vitals Entered By: Stanton Kidney Ditzler RN (April 14, 2010 2:45 PM) Is Patient Diabetic? Yes Did you bring your meter with you today? No Pain Assessment Patient in pain? yes     Location: stomach and back Intensity: 9 Type: cramps Onset of pain  long time Nutritional Status BMI of 19 -24 = normal Nutritional Status Detail appetite down CBG Result 78  Have you ever been in a relationship where you felt threatened, hurt or afraid?denies   Does patient need assistance? Functional Status Self care Ambulation Normal Comments Results CT.   Primary Care Provider:  Johnette Abraham DO   History of Present Illness: 55 yo woman with DM type II, HTN, abdominal pain, pancreas divisum discovered on ERCP 2002 presents for follow up on her abdominal pain that has been going on in the past 1 month.  Patient was admitted to hospital for acute pancreatitis in 08/2009 and then had pancreatic duct stent placement in June at [facility].  Her stent was taken out about 2 weeks later and she has been having intermittent diarrhea ever since.  She does not have any abdominal pain until about 1 month ago so she came into clinic to be evaluated.  Patient had a CT scan of abdomen on 04/08/2010 which was negative.  In the past month, she has been feeling bloated/fullness and nauseated.  She has diarrhea on and off since June and had 20 episodes yesterday.  She has to constantly take Imodium to control her diarrhea.  She denied any fever, vomiting, SOB, chest pain.     Depression History:      The patient denies a depressed mood most of the day and a diminished interest in her usual daily  activities.         Preventive Screening-Counseling & Management  Alcohol-Tobacco     Alcohol drinks/day: 0     Smoking Status: quit     Year Quit: 2000     Pack years: 30  Caffeine-Diet-Exercise     Does Patient Exercise: yes     Type of exercise: FLOOR EXERCISE     Times/week: 2  Allergies: 1)  ! Fiorinal (Butalbital-Aspirin-Caffeine) 2)  ! Fioricet (Butalbital-Apap-Caffeine) 3)  ! Sulfa  Physical Exam  General:  alert, well-developed, well-nourished, and well-hydrated.   Lungs:  normal respiratory effort, no intercostal retractions, no accessory muscle use, normal breath sounds, no crackles, and no wheezes.   Heart:  normal rate, regular rhythm, no murmur, no gallop, no rub, and no JVD.   Abdomen:  soft, left and right upper quadrants tenderness to palpation, non distended, no guarding, no rigidity, no rebound tenderness Msk:  CVA tenderness on left and right Pulses:  R and L carotid,radial,femoral,dorsalis pedis and posterior tibial pulses are full and equal bilaterally Extremities:  No clubbing, cyanosis, edema, or deformity noted with normal full range of motion of all joints.   Neurologic:  alert & oriented X3, cranial nerves II-XII intact, strength normal in all extremities, and sensation intact to light touch.    Diabetes Management Exam:  Foot Exam (with socks and/or shoes not present):       Sensory-Monofilament:          Left foot: normal          Right foot: normal   Impression & Recommendations:  Problem # 1:  ACUTE PANCREATITIS (ICD-577.0) Assessment Deteriorated Given patient's history of pancreatitis and severe abdominal pain and diarrhea 20 x yesterday, she needs to be admitted for observation with a presumed diagnosis of acute pancreatitis.  She may need a GI consult/stent placement to help manage with her abdominal pain since the CT on 12/8 was negative.  GYN work-up in November 2011 was also negative. Will admit to regular bed for pain and diarrhea  control, and hydration Get MR from [facility] so that we understand what they did for her and the work-up they have done Get stat CMET, CBC with diff, lipase, urinalysis, urine culture, 12 lead EKG IVF NS 200cc/hr x 24 hours NPO  GI consult  Problem # 2:  HYPERTENSION (ICD-401.9) Assessment: Improved BP 155/83 today.  Not well-controlled.  I suspect that this is secondary to her excruciating pain from the abdomen and  back.  Will not change her medication regimen today.   Her updated medication list for this problem includes:    Lisinopril 20 Mg Tabs (Lisinopril) .Marland Kitchen... Take 1 pill by mouth daily.  Complete Medication List: 1)  Metformin Hcl 1000 Mg Tabs (Metformin hcl) .... Take 1 tablet by mouth two times a day 2)  Lisinopril 20 Mg Tabs (Lisinopril) .... Take 1 pill by mouth daily. 3)  Vicodin 5-500 Mg Tabs (Hydrocodone-acetaminophen) .... Take 1 tablet by mouth three times a day as needed for pain  Other Orders: Capillary Blood Glucose/CBG (04540)   Orders Added: 1)  Capillary Blood Glucose/CBG [82948]     Prevention & Chronic Care Immunizations   Influenza vaccine: Fluvax MCR  (03/16/2010)   Influenza vaccine deferral: Deferred  (04/01/2010)    Tetanus booster: Not documented   Td booster deferral: Deferred  (04/14/2010)    Pneumococcal vaccine: Pneumovax  (12/12/2007)  Colorectal Screening   Hemoccult: Not documented   Hemoccult action/deferral: Deferred  (04/14/2010)    Colonoscopy: Normal  (02/27/2004)   Colonoscopy due: 02/26/2014  Other Screening   Pap smear: No malignancy.  (03/11/2010)   Pap smear action/deferral: Deferred  (11/23/2009)   Pap smear due: 03/12/2011    Mammogram: ASSESSMENT: Negative - BI-RADS 1^MM DIGITAL SCREENING  (03/29/2010)   Mammogram action/deferral: Ordered  (03/16/2010)   Mammogram due: 03/30/2011   Smoking status: quit  (04/14/2010)  Diabetes Mellitus   HgbA1C: 6.5  (02/08/2010)   Hemoglobin A1C due: 05/11/2010    Eye exam:  No diabetic retinopathy.     (08/19/2009)   Diabetic eye exam action/deferral: Ophthalmology referral  (06/05/2009)   Eye exam due: 08/20/2010    Foot exam: yes  (04/14/2010)   High risk foot: Yes  (04/14/2010)   Foot care education: Not documented   Foot exam due: 05/04/2010    Urine microalbumin/creatinine ratio: 14.6  (03/16/2010)   Urine microalbumin action/deferral: Ordered   Urine microalbumin/cr due: 03/17/2011    Diabetes flowsheet reviewed?: Yes   Progress toward A1C goal: At goal  Lipids   Total Cholesterol: 214  (06/05/2009)   Lipid panel action/deferral: Lipid Panel ordered   LDL: 103  (06/05/2009)   LDL Direct: Not documented   HDL: 92  (06/05/2009)   Triglycerides: 95  (06/05/2009)    SGOT (AST): 24  (03/16/2010)  BMP action: Ordered   SGPT (ALT): 12  (03/16/2010)   Alkaline phosphatase: 88  (03/16/2010)   Total bilirubin: 0.4  (03/16/2010)    Lipid flowsheet reviewed?: Yes   Progress toward LDL goal: At goal  Hypertension   Last Blood Pressure: 155 / 83  (04/14/2010)   Serum creatinine: 0.75  (03/16/2010)   BMP action: Ordered   Serum potassium 3.9  (03/16/2010)   Basic metabolic panel due: 04/15/2010    Hypertension flowsheet reviewed?: Yes   Progress toward BP goal: Improved  Self-Management Support :   Personal Goals (by the next clinic visit) :     Personal A1C goal: 7  (04/13/2009)     Personal blood pressure goal: 140/90  (04/13/2009)     Personal LDL goal: 130  (04/13/2009)    Patient will work on the following items until the next clinic visit to reach self-care goals:     Medications and monitoring: take my medicines every day, bring all of my medications to every visit, examine my feet every day  (04/14/2010)     Eating: drink diet soda or water instead of juice or soda, eat more vegetables, use fresh or frozen vegetables, eat baked foods instead of fried foods, eat fruit for snacks and desserts, limit or avoid alcohol  (04/14/2010)      Activity: take a 30 minute walk every day, take the stairs instead of the elevator  (04/14/2010)    Diabetes self-management support: Written self-care plan, Education handout, Resources for patients handout  (04/14/2010)   Diabetes care plan printed   Diabetes education handout printed    Hypertension self-management support: Written self-care plan, Education handout, Resources for patients handout  (04/14/2010)   Hypertension self-care plan printed.   Hypertension education handout printed    Lipid self-management support: Written self-care plan, Education handout, Resources for patients handout  (04/14/2010)   Lipid self-care plan printed.   Lipid education handout printed      Resource handout printed.   Last LDL:                                                 103 (06/05/2009 8:46:00 PM)        Diabetic Foot Exam Foot Inspection Is there a history of a foot ulcer?              No Is there a foot ulcer now?              No Can the patient see the bottom of their feet?          Yes Are the shoes appropriate in style and fit?          Yes Is there swelling or an abnormal foot shape?          No Are the toenails long?                No Are the toenails thick?                No Are the toenails ingrown?              No Is there heavy callous build-up?              No Is there a claw toe deformity?  No Is there elevated skin temperature?            No Is there limited ankle dorsiflexion?            No Is there foot or ankle muscle weakness?            No Do you have pain in calf while walking?           No       High Risk Feet? Yes   10-g (5.07) Semmes-Weinstein Monofilament Test Performed by: Stanton Kidney Ditzler RN          Right Foot          Left Foot Visual Inspection     normal         normal Test Control      normal         normal Site 1         normal         normal Site 2         normal         normal Site 3         normal          normal Site 4         normal         normal Site 5         normal         normal Site 6         normal         normal Site 7         normal         normal Site 8         normal         normal Site 9         normal         normal Site 10         normal         normal  Impression      normal         normal  Appended Document: ACUTE-TO GO OVER TEST RESULTS/CFB([facility]) + normoactive bowel sounds  Appended Document: ACUTE-TO GO OVER TEST RESULTS/CFB([facility]) Pt taken to Room 4504 via w/c after report given by phone to a nurse.

## 2010-06-03 NOTE — Assessment & Plan Note (Signed)
Summary: HFU/SB.   Vital Signs:  Patient profile:   55 year old female Height:      61.5 inches Weight:      116.4 pounds BMI:     21.72 Temp:     98.5 degrees F oral Pulse rate:   73 / minute BP sitting:   141 / 89  (right arm)  Vitals Entered By: Filomena Jungling NT II (May 05, 2010 1:30 PM) CC: HFU Is Patient Diabetic? Yes Did you bring your meter with you today? No Pain Assessment Patient in pain? no      Nutritional Status BMI of 19 -24 = normal CBG Result 73  Have you ever been in a relationship where you felt threatened, hurt or afraid?No   Does patient need assistance? Functional Status Self care Ambulation Normal   Primary Care Provider:  Johnette Abraham DO  CC:  HFU.  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 She denies any abdominal pain, nausea, vomiting today. Her lipase was 26 when checked in the hospital(04/14/10).  Current Medications (verified): 1)  Metformin Hcl 1000 Mg Tabs (Metformin Hcl) .... Take 1 Tablet By Mouth Two Times A Day 2)  Lisinopril 20 Mg Tabs (Lisinopril) .... Take 1 Pill By Mouth Daily. 3)  Vicodin 5-500 Mg Tabs (Hydrocodone-Acetaminophen) .... Take 1 Tablet By Mouth Three Times A Day As Needed For Pain 4)  Pantoprazole Sodium 40 Mg Tbec (Pantoprazole Sodium) .... Take 1 Tab Daily 5)  Loperamide Hcl 2 Mg Tabs (Loperamide Hcl) .... Take 1 Tab As Needed For Diarrhea- Not More Than 6 Tablets A Day  Allergies: 1)  ! Fiorinal (Butalbital-Aspirin-Caffeine) 2)  ! Fioricet (Butalbital-Apap-Caffeine) 3)  ! 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  Past Surgical History: Last updated: 11/10/2006 Hysterectomy for endometriosis when she was 23  Family History: Last updated: 09/23/2008 Sickle cell in brothers and cousin Grandmother had breast cancer F: prostate CA 80 living M: 29 healthy Sister sickle cell Mother's brother lung CA Mother's sister lung CA  Social History: Last updated: 09/23/2008 Lives alone Works at [employer] as a Merchandiser, retail, on Health visitor about 9hours/day former smoker - quit 2001; had smoked 30 years prior that Alcohol use-no Drug use-yes (reports no today) qui smokting 2000  Risk Factors: Alcohol Use: 0 (04/14/2010) Exercise: yes (04/14/2010)  Risk Factors: Smoking Status: quit (04/14/2010)  Review of Systems  The patient denies anorexia, fever, decreased hearing, hoarseness, chest pain, syncope, dyspnea on exertion, peripheral edema, headaches, hemoptysis, abdominal pain, melena, and hematochezia.    Physical Exam  General:  alert, well-developed, well-nourished, and well-hydrated.  alert, well-developed, well-nourished, and well-hydrated.   Head:  normocephalic, atraumatic, no abnormalities observed, and no abnormalities palpated.  normocephalic, atraumatic, no abnormalities observed, and no abnormalities palpated.   Eyes:  vision grossly intact, pupils equal, pupils round, and pupils reactive to light.  vision grossly intact, pupils equal, pupils round, and  pupils reactive to light.   Mouth:  pharynx pink and moist.  pharynx pink and moist.   Neck:  supple and full ROM.  supple and full ROM.   Lungs:  normal respiratory effort, no intercostal retractions, no accessory muscle use, normal breath sounds, no dullness, no fremitus, no crackles, and no  wheezes.  normal respiratory effort, no intercostal retractions, no accessory muscle use, normal breath sounds, no dullness, no fremitus, no crackles, and no wheezes.   Heart:  normal rate, regular rhythm, no murmur, no gallop, no rub, and no JVD.  normal rate, regular rhythm, no murmur, no gallop, no rub, and no JVD.   Abdomen:  soft, non-tender, normal bowel sounds, no distention, no masses, no guarding, and no rigidity.  soft, non-tender, normal bowel sounds, no distention, no masses, no guarding, and no rigidity.   Msk:  normal ROM, no joint tenderness, no joint swelling, no joint warmth, no redness over joints, and no joint deformities.  normal ROM, no joint tenderness, no joint swelling, no joint warmth, no redness over joints, and no joint deformities.   Pulses:  2+pulses b/l. Neurologic:  alert & oriented X3, cranial nerves II-XII intact, strength normal in all extremities, sensation intact to light touch, sensation intact to pinprick, and gait normal.  alert & oriented X3, cranial nerves II-XII intact, strength normal in all extremities, sensation intact to light touch, sensation intact to pinprick, and gait normal.     Impression & Recommendations:  Problem # 1:  PANCREATITIS, ACUTE, HX OF (ICD-V12.70) Assessment Comment Only She comes in today as a hospital follow up for acute pancreatitis 2/2 pancraetic divisum. She denies any abd pain, nausea, vomiting today. Although she says that she gets some abdominal pain off and on for which I will give give her 16 pills of vicodin. She was encouraged not to  miss her appointment with gastroenetrologist(Dr.Gessner, [facility] on May 27, 2010).  Problem # 2:  DIABETES MELLITUS, TYPE II (ICD-250.00) At goal. Congratulated her! Will dcrease the dose of her metformin form 1000 mg two times a day to 500mg  two times a day. Was adviced to continue with diet and exercise.  Her updated medication list for this problem includes:    Metformin Hcl 500 Mg  Tabs (Metformin hcl) .Marland Kitchen... Take 1 tab by mouth two times a day    Lisinopril 20 Mg Tabs (Lisinopril) .Marland Kitchen... Take 1 pill by mouth daily.  Orders: T- Capillary Blood Glucose (16109) T-Hgb A1C (in-house) ([id number])  Labs Reviewed: Creat: 0.75 (03/16/2010)     Last Eye Exam: No diabetic retinopathy.    (08/19/2009) Reviewed HgBA1c results: 6.4 (05/05/2010)  6.5 (02/08/2010)  Problem # 3:  HYPERTENSION (ICD-401.9) Assessment: Comment Only Slightly high today. But was fairly well controlled during her hospital stay. Will not make any changes in her regimen today. Her updated medication list for this problem includes:    Lisinopril 20 Mg Tabs (Lisinopril) .Marland Kitchen... Take 1 pill by mouth daily.  Complete Medication List: 1)  Metformin Hcl 500 Mg Tabs (Metformin hcl) .... Take 1 tab by mouth two times a day 2)  Lisinopril 20 Mg Tabs (Lisinopril) .... Take 1 pill by mouth daily. 3)  Vicodin 5-500 Mg Tabs (Hydrocodone-acetaminophen) .... Take 1 tablet by mouth three times a day as needed for pain 4)  Pantoprazole Sodium 40 Mg Tbec (Pantoprazole sodium) .... Take 1 tab daily 5)  Loperamide Hcl 2 Mg Tabs (Loperamide hcl) .... Take 1 tab as needed for diarrhea- not more  than 6 tablets a day  Patient Instructions: 1)  Please schedule a follow-up appointment in 2 months. 2)  Please take your medicines as prescribed. 3)  Please keep up your appointment with gastroenterologist on jan 26th 2011. Prescriptions: VICODIN 5-500 MG TABS (HYDROCODONE-ACETAMINOPHEN) Take 1 tablet by mouth three times a day as needed for pain  #16 x 0   Entered and Authorized by:   Elyse Jarvis   Signed by:   Elyse Jarvis on 05/05/2010   Method used:   Print then Give to Patient   RxID:   [id number] LISINOPRIL 20 MG TABS (LISINOPRIL) take 1 pill by mouth daily.  #30 Each x 5   Entered and Authorized by:   Elyse Jarvis   Signed by:   Elyse Jarvis on 05/05/2010   Method used:   Print then Give to Patient   RxID:    [id number]    Orders Added: 1)  T- Capillary Blood Glucose [82948] 2)  T-Hgb A1C (in-house) [83036QW] 3)  Est. Patient Level III [84132]    Prevention & Chronic Care Immunizations   Influenza vaccine: Fluvax MCR  (03/16/2010)   Influenza vaccine deferral: Deferred  (04/01/2010)    Tetanus booster: Not documented   Td booster deferral: Deferred  (04/14/2010)    Pneumococcal vaccine: Pneumovax  (12/12/2007)  Colorectal Screening   Hemoccult: Not documented   Hemoccult action/deferral: Deferred  (04/14/2010)    Colonoscopy: Normal  (02/27/2004)   Colonoscopy due: 02/26/2014  Other Screening   Pap smear: No malignancy.  (03/11/2010)   Pap smear action/deferral: Deferred  (11/23/2009)   Pap smear due: 03/12/2011    Mammogram: ASSESSMENT: Negative - BI-RADS 1^MM DIGITAL SCREENING  (03/29/2010)   Mammogram action/deferral: Ordered  (03/16/2010)   Mammogram due: 03/30/2011   Smoking status: quit  (04/14/2010)  Diabetes Mellitus   HgbA1C: 6.4  (05/05/2010)   Hemoglobin A1C due: 05/11/2010    Eye exam: No diabetic retinopathy.     (08/19/2009)   Diabetic eye exam action/deferral: Ophthalmology referral  (06/05/2009)   Eye exam due: 08/20/2010    Foot exam: yes  (04/14/2010)   High risk foot: Yes  (04/14/2010)   Foot care education: Not documented   Foot exam due: 05/04/2010    Urine microalbumin/creatinine ratio: 14.6  (03/16/2010)   Urine microalbumin action/deferral: Ordered   Urine microalbumin/cr due: 03/17/2011  Lipids   Total Cholesterol: 214  (06/05/2009)   Lipid panel action/deferral: Lipid Panel ordered   LDL: 103  (06/05/2009)   LDL Direct: Not documented   HDL: 92  (06/05/2009)   Triglycerides: 95  (06/05/2009)    SGOT (AST): 24  (03/16/2010)   BMP action: Ordered   SGPT (ALT): 12  (03/16/2010)   Alkaline phosphatase: 88  (03/16/2010)   Total bilirubin: 0.4  (03/16/2010)  Hypertension   Last Blood Pressure: 141 / 89  (05/05/2010)    Serum creatinine: 0.75  (03/16/2010)   BMP action: Ordered   Serum potassium 3.9  (03/16/2010)   Basic metabolic panel due: 04/15/2010  Self-Management Support :   Personal Goals (by the next clinic visit) :     Personal A1C goal: 7  (04/13/2009)     Personal blood pressure goal: 140/90  (04/13/2009)     Personal LDL goal: 130  (04/13/2009)    Patient will work on the following items until the next clinic visit to reach self-care goals:     Medications and monitoring: take my medicines every day, check my blood pressure, weigh myself weekly  (  05/05/2010)     Eating: drink diet soda or water instead of juice or soda, eat more vegetables, use fresh or frozen vegetables, eat foods that are low in salt, eat baked foods instead of fried foods, eat fruit for snacks and desserts  (05/05/2010)     Activity: take a 30 minute walk every day, park at the far end of the parking lot  (05/05/2010)    Diabetes self-management support: Resources for patients handout  (05/05/2010)    Hypertension self-management support: Resources for patients handout  (05/05/2010)    Lipid self-management support: Resources for patients handout  (05/05/2010)        Resource handout printed.   Laboratory Results   Blood Tests   Date/Time Received: May 05, 2010 2:08 PM .sign Date/Time Reported: Burke Keels  May 05, 2010 2:09 PM   HGBA1C: 6.4%   (Normal Range: Non-Diabetic - 3-6%   Control Diabetic - 6-8%) CBG Random:: 73mg /dL

## 2010-06-03 NOTE — Discharge Summary (Signed)
Summary: Hospital Discharge Update    Hospital Discharge Update:  Date of Admission: 05/25/2010 Date of Discharge: 05/27/2010  Brief Summary:  This is a 55 year old female with PMH significant for history of recurrent pancreatitis with ducmentd pacreas divisum s/p stenting 08/2009 and s/p removal 11/2009 due to migration wo presented to [facility] ED with abdominal pain. Patient was transfered to [facility] for further evaluation of the pain. ABX within normal limits. Labs not suggestive of electrolyte dearrangements although patient noted nausea, vomiting and diarrhea with decreased by mouth intake. No signs of infection . Lipase within normal limits. Vitals stable. After consulting GI (Dr Christella Hartigan) the recommendation included referral to [facility] for possible restenting. Patient will follow up on 06/22/2010 at 1 pm with Daniel Nones at [facility] for further mangagement. Discussed in extense with patient that the pain may likely be due to pancras divisium since she noted that her pain improved sig. s/p stenting got worth again when it was removed. At this point we are treating her symptomatic with pain medication and antiemetics. We underlined that the pain medication is just for shortterm until furhter intervention are performed an the patient understood this. She noted again that after stenting the pain was gone.  Other follow-up issues:  Evaluate patient abdominal pain and emesis. May decrease pain medication regimen.    Medication list changes:  Changed medication from METFORMIN HCL 500 MG TABS (METFORMIN HCL) take 1 tab by mouth two times a day to METFORMIN HCL 500 MG TABS (METFORMIN HCL) take 1 tab by mouth two times a day  If you are eating. Changed medication from VICODIN 5-500 MG TABS (HYDROCODONE-ACETAMINOPHEN) Take 1 tablet by mouth three times a day as needed for pain to VICODIN 5-500 MG TABS (HYDROCODONE-ACETAMINOPHEN) Take 1 tablet by mouth q6hrs as needed for pain - Signed Added new  medication of MS CONTIN 30 MG XR12H-TAB (MORPHINE SULFATE) Take one tablet by mouth two times a day . - Signed Added new medication of CREON 6000 UNIT CPEP (PANCRELIPASE (LIP-PROT-AMYL)) Take two tablet by mouth with every meal and take one tablet by mouth with every snack - Signed Added new medication of PROMETHAZINE HCL 12.5 MG TABS (PROMETHAZINE HCL) Take one tablet by mouth q6hrs as needed for nausea - Signed Rx of MS CONTIN 30 MG XR12H-TAB (MORPHINE SULFATE) Take one tablet by mouth two times a day .;  #60 x 0;  Signed;  Entered by: Almyra Deforest MD;  Authorized by: Almyra Deforest MD;  Method used: Handwritten Rx of CREON 6000 UNIT CPEP (PANCRELIPASE (LIP-PROT-AMYL)) Take two tablet by mouth with every meal and take one tablet by mouth with every snack;  #240 x 3;  Signed;  Entered by: Almyra Deforest MD;  Authorized by: Almyra Deforest MD;  Method used: Handwritten Rx of PROMETHAZINE HCL 12.5 MG TABS (PROMETHAZINE HCL) Take one tablet by mouth q6hrs as needed for nausea;  #45 x 1;  Signed;  Entered by: Almyra Deforest MD;  Authorized by: Almyra Deforest MD;  Method used: Handwritten Rx of VICODIN 5-500 MG TABS (HYDROCODONE-ACETAMINOPHEN) Take 1 tablet by mouth q6hrs as needed for pain;  #45 x 0;  Signed;  Entered by: Almyra Deforest MD;  Authorized by: Almyra Deforest MD;  Method used: Handwritten  Discharge medications:  METFORMIN HCL 500 MG TABS (METFORMIN HCL) take 1 tab by mouth two times a day  If you are eating. LISINOPRIL 20 MG TABS (LISINOPRIL) take 1 pill by mouth daily. VICODIN 5-500 MG TABS (HYDROCODONE-ACETAMINOPHEN)  Take 1 tablet by mouth q6hrs as needed for pain PANTOPRAZOLE SODIUM 40 MG TBEC (PANTOPRAZOLE SODIUM) Take 1 tab daily LOPERAMIDE HCL 2 MG TABS (LOPERAMIDE HCL) Take 1 tab as needed for diarrhea- not more than 6 tablets a day MS CONTIN 30 MG XR12H-TAB (MORPHINE SULFATE) Take one tablet by mouth two times a day . CREON 6000 UNIT CPEP (PANCRELIPASE (LIP-PROT-AMYL)) Take two  tablet by mouth with every meal and take one tablet by mouth with every snack PROMETHAZINE HCL 12.5 MG TABS (PROMETHAZINE HCL) Take one tablet by mouth q6hrs as needed for nausea  Other patient instructions:  You have an appointment with [name] at [facility] on ther Ground floor of [facility] on 06/22/2010 at 1:00 pm. Please find further information attached along with this document from [facility]   You will be called for an appointment at the [facility].   Note: Hospital Discharge Medications & Other Instructions handout was printed, one copy for patient and a second copy to be placed in hospital chart.  Prescriptions: VICODIN 5-500 MG TABS (HYDROCODONE-ACETAMINOPHEN) Take 1 tablet by mouth q6hrs as needed for pain  #45 x 0   Entered and Authorized by:   Almyra Deforest MD   Signed by:   Almyra Deforest MD on 05/29/2010   Method used:   Handwritten   RxID:   [id number] PROMETHAZINE HCL 12.5 MG TABS (PROMETHAZINE HCL) Take one tablet by mouth q6hrs as needed for nausea  #45 x 1   Entered and Authorized by:   Almyra Deforest MD   Signed by:   Almyra Deforest MD on 05/29/2010   Method used:   Handwritten   RxID:   [id number] CREON 6000 UNIT CPEP (PANCRELIPASE (LIP-PROT-AMYL)) Take two tablet by mouth with every meal and take one tablet by mouth with every snack  #240 x 3   Entered and Authorized by:   Almyra Deforest MD   Signed by:   Almyra Deforest MD on 05/29/2010   Method used:   Handwritten   RxID:   [id number] MS CONTIN 30 MG XR12H-TAB (MORPHINE SULFATE) Take one tablet by mouth two times a day .  #60 x 0   Entered and Authorized by:   Almyra Deforest MD   Signed by:   Almyra Deforest MD on 05/29/2010   Method used:   Handwritten   RxID:   [id number]

## 2010-06-03 NOTE — Miscellaneous (Signed)
Summary: Hospital admission  INTERNAL MEDICINE ADMISSION HISTORY AND PHYSICAL  Attending: Dr Ulyess Mort   R1 Dr Allena Katz [phone] R2 Dr Eben Burow [phone]   PCP: Johnette Abraham  CC: Abdominal pain  HPI: Abdominal pain - has been ongoing for at least past 1 month with intermittent pain, feelings like something is pushing down on vagina, worst for the past 2 days, keeping her from work. was evaluated by gyn recently for this pain, who determined pain was not related to gyn source. current pain described as 10/10 pain, left lower quadrant goes to back, sharp intermittent lasting for several hours at a time, taking bentyl and tramadol which mildly improve the pain.  no fevers/ chills, associated with nausea, no vomiting, eating soup during this time. No positional attributes, only better with pain medicine. Aggravating factors none.  Having loose stools since May 2011 when she had pancreatic stenting.  No hematuria, dysuria, no gerd symptoms.  Denies depression symptoms, refuses antidepressant therapy.   ALLERGIES: ! FIORINAL (BUTALBITAL-ASPIRIN-CAFFEINE)- coma ! FIORICET [facility]) ! SULFA- rash Shellfish, shrimp- tongue swelling, anaphylaxis.  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 VICODIN 5-500 MG TABS  (HYDROCODONE-ACETAMINOPHEN) Take 1 tablet by mouth three times a day as needed for pain   SOCIAL HISTORY: Lives with husband Works at [employer] as a Merchandiser, retail, on Health visitor about 9hours/day former smoker - quit 2000; had smoked 30 years prior that Alcohol use-no- last drink in 1991 Drug use-denies   FAMILY HISTORY Sickle cell in brothers and cousin Grandmother had breast cancer F: prostate CA 63 living M: 55 healthy Sister sickle cell Mother's brother lung CA Mother's sister lung CA   ROS: 10 point review of system was negative other then whet mentioned above  VITALS: T: 98.0  P: 80/min BP:155/83  R: 18 O2SAT: 100% ON: RA  PHYSICAL EXAM: Gen: AOx3, in no acute distress Eyes: PERRL, EOMI ENT:MMM, No erythema noted in posterior pharynx Neck: No JVD, No LAP Chest: CTAB with  good respiratory effort CVS: regular rhythmic rate, NO M/R/G, S1 S2 normal Abdo: soft,ND, BS hyperactive. Diffuse severe tenderness to palpation, more in epigastric area.  EXT: No odema noted Neuro: Non focal, gait is normal, A&O x3.  Skin: no rashes noted.  Psych: Appropriate  LABS: CBC:  WBC                                      9.5               4.0-10.5         K/uL  RBC  4.66              3.87-5.11        MIL/uL  Hemoglobin (HGB)                         12.2              12.0-15.0        g/dL  Hematocrit (HCT)                         36.1              36.0-46.0        %  MCV                                      77.5       l      78.0-100.0       fL  MCH -                                    26.2              26.0-34.0        pg  MCHC                                     33.8              30.0-36.0        g/dL  RDW                                      14.2              11.5-15.5        %  Platelet Count (PLT)                     338               150-400          K/uL  Neutrophils, %                           48                43-77            %  Lymphocytes, %                            42                12-46            %  Monocytes, %                             8                 3-12             %  Eosinophils, %  1                 0-5              %  Basophils, %                             0                 0-1              %  Neutrophils, Absolute                    4.5               1.7-7.7          K/uL  Lymphocytes, Absolute                    4.0               0.7-4.0          K/uL  Monocytes, Absolute                      0.8               0.1-1.0          K/uL  Eosinophils, Absolute                    0.1               0.0-0.7          K/uL  Basophils, Absolute                      0.0               0.0-0.1          K/uL  CMET:  Sodium (NA)                              137               135-145          mEq/L  Potassium (K)                            3.6               3.5-5.1          mEq/L  Chloride                                 104               96-112           mEq/L  CO2                                      26                19-32            mEq/L  Glucose  81                70-99            mg/dL  BUN                                      8                 6-23             mg/dL  Creatinine                               0.74              0.4-1.2          mg/dL  GFR, Est Non African American            >60               >60              mL/min  GFR, Est African American                >60               >60              mL/min    Oversized comment, see footnote  1  Bilirubin, Total                         0.4               0.3-1.2          mg/dL  Alkaline Phosphatase                     64                39-117           U/L  SGOT (AST)                               23                0-37             U/L  SGPT (ALT)                               16                0-35             U/L  Total  Protein                           7.5               6.0-8.3          g/dL   Albumin-Blood                            3.9               3.5-5.2  g/dL  Calcium                                  9.2               8.4-10.5         mg/dL    Lipase                                   26                11-59            U/L  CT Abd/Pelvis:   Findings: The pancreas has a normal noncontrast appearance on   today's study.  No evidence of peripancreatic inflammatory changes   or fluid collections.  No evidence of pancreatic calcification.  IMPRESSION:   Stable exam.  No acute findings or other significant abnormality   identified.   ASSESSMENT AND PLAN: 1) Abdominal pain: Consdireing the Hx, exam and PMH - pts pain is most likely 2/2 acute pancreatitis vs. infectious diarrhea. Pt has a recent GYN eval neg for any abnormalites. CT abd/pelvis done on 12/8 showed no acute changes. - Will admit pt to regular bed. - Make pt NPO for now, unless pt r/o for acute Pancreatitis. - Will give analgesics and antiemetics.  2) Diarrhea: Pt having loose watery diarrhea since 5/11 after having the pancreatic stenting( after minor papillotomy- stent removed on 12/02/09 due to internal migration of stent). -Will do stool studies- Cx, Laxative screen, osmotic gap, fecal fat and leukocytes, FOBT. - C.Diff PCR - Will continue immodium.  3)DM:  Last HbA1c was 6.7 on 02/08/10.  Will start her on SSI sensitive.  4)HTN: Will hold on her BP meds now.   5)VTE PROPH: lovenox

## 2010-06-04 NOTE — Discharge Summary (Signed)
[name], DVORSKY NO.:  [id number]  MEDICAL RECORD NO.:  [id number]          PATIENT TYPE:  INP  LOCATION:  6730                         FACILITY:  [facility]  PHYSICIAN:  Mariea Stable, MD   DATE OF BIRTH:  09/30/1955  DATE OF ADMISSION:  05/25/2010 DATE OF DISCHARGE:  05/27/2010                              DISCHARGE SUMMARY   DISCHARGE DIAGNOSES: 1. Recurrent abdominal pain with history of pancreatitis, earliest     documented in 2002, last in May 2011 and December 2011. 2. History of pancreatic divisum documented in [address] by magnetic     resonance pancreatocholangiography.  Status post stent placement in     May 2011 and removal in August 2011 due to migration at the [facility]. 3. Chronic diarrhea control with Imodium, but no diarrhea during this     hospital admission documented. 4. Diabetes mellitus type 2, hemoglobin A1c 6.4 in January 2012. 5. Hyperlipidemia. 6. Hypertension. 7. History of pulmonary embolism around 2006, received Coumadin for 1     year. 8. History of seizure disorder, currently on no medication. 9. Migraine headache. 10.Status post total abdominal hysterectomy and bilateral salpingo-     oophorectomy. 11.Status post bilateral lumpectomy. 12.Breast biopsy benign, status post bilateral breast implants.  DISCHARGE MEDICATIONS: 1. Metformin 500 mg b.i.d. 2. Lisinopril 20 mg once daily. 3. Vicodin 5/500 mg one tablet q.6 h. as needed for pain. 4. Protonix 40 mg once daily. 5. Loperamide one tablet for diarrhea as needed. 6. MS Contin 30 mg one tablet b.i.d. 7. Creon 6000 units two tablets by mouth with every meal and one     tablet with every snack. 8. Promethazine 12.5 mg q.6 h. as needed for nausea.  DISPOSITION AND FOLLOWUP:  The patient will follow up with [name] at [facility] on June 22, 2010 at 1 p.m. at the Gastroenterology Clinic in the [facility] on the  ground floor of [employer] for further evaluation of abdominal pain and possible re-stenting.  The patient will follow up at the Outpatient Clinic for hospital followup.  Please during this time, evaluate the patient's abdominal pain, may adjust pain medication regimen accordingly.  PROCEDURES:  Abdominal series on May 25, 2010, no active cardiopulmonary disease in view.  No free air or acute specific abnormalities of the bowel gas pattern.  Margin is intact.  No pathology calcifications except for bilateral biceps calcifications probably to related to IM ejection.  Impression, no acute or significant findings. A PICC line was placed during this hospital admission.  CONSULTATION:  Rachael Fee, MD from [facility] was consulted for abdominal pain.  Recommendation included possible re- stenting or duct sphincterotomy.  BRIEF ADMISSION HISTORY AND PHYSICAL:  This is a 55 year old female with past medical history significant for recurrent pancreatitis, status post stent placement with removal due to migration in August 2011, diabetes mellitus who called at the Outpatient Clinic for evaluation of abdominal pain since 1 day.  She was asked to visit ED if abdominal pain was worsening since appointments were available.  She tried to write that out since  the patient had an appointment with [name] on May 27, 2010, but the pain worsened significantly and was associated with nausea and episodes of nonbloody, nonbilious emesis.  The pain was located in the epigastric left upper and lower quadrant, pain was radiating to the back.  It was graded at 10/10 at worse with no aggravating or alleviating factors.  During ED, she received some IV pain medications and antiemetics, which improved her nausea and her pain.  She further noted she had a diarrhea and decreased p.o. intake since 3 days.  She denied any alcohol intake.  Furthermore, she denied any chest  pain, shortness of breath, no recent sick contacts or recent travel.  PHYSICAL EXAMINATION:  VITAL SIGNS:  Temperature 97.6, pulse 72, blood pressure 142/82, respiratory rate 20, saturation 95% on room air. GENERAL:  Alert, cooperative to examination in minimal distress. HEENT:  Head:  Normocephalic and atraumatic. Eyes:  Vision grossly intact.  Pupils are equal, round and reactive to light.  No injection, anicteric.  Mouth:  Pharynx pink, moist Mucous membranes, no erythema, no exudates. NECK:  Supple, full range of motion.  No thyromegaly, no JVD, no carotid bruits. LUNGS:  Normal respiratory effort.  No accessory muscle use.  Normal breath sounds, no crackles, no wheezes. HEART:  Regular rate and rhythm.  No gallops or rubs. ABDOMEN:  Soft, mildly tender in the epigastric left upper and lower quadrant with no rebound tenderness or guarding.  Normal bowel sounds. MUSCULOSKELETAL:  No joint swelling.  No joint warmth and no redness over joints. PULSE:  2+ pulses bilaterally. EXTREMITIES:  No cyanosis, clubbing or edema. NEUROLOGIC:  Alert and oriented x3.  Cranial nerves II through XII intact.  Strengths are normal in all extremities.  Sensation intact to light touch and gait normal. SKIN:  Turgor normal and no rashes. PSYCHIATRY:  Oriented x3.  Memory intact to recent and remote, normal interactive, good eye contact, not anxious appearing and not depressed appearing.  LABS:  CBC; hemoglobin of 10.5, hematocrit of 36, MCV of 77.1, platelets 363.  Sodium of 141, potassium of 4.1, chloride 105, CO2 26, glucose 132, BUN 5, creatinine 0.71.  Total bili 1, alk phos 61, AST 19, ALT 12, total bilirubin 8.3, albumin 4.2, calcium 10.1.  Urinalysis was negative for nitrates and leukocytes, lipase was 36.  HOSPITAL COURSE: 1. Abdominal pain possibly due to pancreatic divisum.  The patient had     recurrent history of acute pancreatitis in the past with elevated     lipase and CT showing  acute pancreatitis last in May 2011.  The     patient noted that the abdominal pain improved significantly,     status post stent placement in May 2011 and worse since stent was     removed.  GI was consulted and recommended that the patient needs     to be referred to [facility] for further evaluation and possible re-     stenting or duct sphincterotomy.  During this hospital admission,     electrolytes were within normal limits, no signs of infection,     lipase was within normal limits, although the patient was     complaining about epigastric left upper quadrant and left lower     quadrant pain.  Reviewing labs and vitals are likely pain was from     biliary disease, acute pancreatitis, diverticular disease, kidney     stones, mesenteric ischemia, there is a consideration of dyspepsia,     but  possibly unlikely at this point, the recommendation include     symptomatic management with pain medication including MS Contin and     Vicodin for breakthrough pain and antiemetics.  The patient will     follow up at the Outpatient Clinic for possible changes in the     medication regimen, but the patient primary need to follow up at     [facility] for further evaluation and management. 2. Diabetes mellitus.  The patient had episodes of hypoglycemia since     the patient was n.p.o.  With the starting diet, hypoglycemic     episodes resolved.  The patient was discharged with metformin with     the recommendation if the patient was not eating, not to take the     medication. 3. Hypertension.  The patient had episodes of high blood pressure     since blood pressure medication was stopped.  Initially since the     patient was n.p.o. on day of discharge, blood pressures improved     and the patient was advised to continue her blood pressure     medication.  VITALS ON DISCHARGE:  Temperature 98.5, pulse 85, respiratory rate 20, blood pressure 114/87, saturation 97% on room air.  LABS ON DISCHARGE:   Sodium of 140, potassium of 3.4, chloride 106, CO2 24, glucose 88, BUN 3, creatinine 0.67, calcium 9.2.  CBC; WBC of 8, hemoglobin 11.4, hematocrit 34.4, MCV 76.6, platelets 360.  Urine culture was negative.  Urine drug screen was negative except for opiates, but the patient was receiving morphine.    ______________________________ Almyra Deforest, MD   ______________________________ Mariea Stable, MD    JI/MEDQ  D:  05/29/2010  T:  05/30/2010  Job:  [id number]  cc:   Johnette Abraham, DO Harolyn Rutherford, MD  Electronically Signed by Almyra Deforest MD on 06/03/2010 06:42:34 PM Electronically Signed by Mariea Stable MD on 06/04/2010 08:24:44 AM

## 2010-06-10 ENCOUNTER — Encounter: Payer: Self-pay | Admitting: Internal Medicine

## 2010-06-10 ENCOUNTER — Other Ambulatory Visit: Payer: Self-pay | Admitting: Internal Medicine

## 2010-06-10 ENCOUNTER — Ambulatory Visit (INDEPENDENT_AMBULATORY_CARE_PROVIDER_SITE_OTHER): Payer: BC Managed Care – PPO | Admitting: Internal Medicine

## 2010-06-10 DIAGNOSIS — K859 Acute pancreatitis without necrosis or infection, unspecified: Secondary | ICD-10-CM

## 2010-06-10 DIAGNOSIS — I10 Essential (primary) hypertension: Secondary | ICD-10-CM

## 2010-06-10 DIAGNOSIS — E785 Hyperlipidemia, unspecified: Secondary | ICD-10-CM

## 2010-06-10 DIAGNOSIS — E119 Type 2 diabetes mellitus without complications: Secondary | ICD-10-CM

## 2010-06-10 DIAGNOSIS — K219 Gastro-esophageal reflux disease without esophagitis: Secondary | ICD-10-CM

## 2010-06-10 MED ORDER — METFORMIN HCL 500 MG PO TABS: 500.0000 mg | ORAL_TABLET | Freq: Two times a day (BID) | ORAL | Status: DC

## 2010-06-10 MED ORDER — HYDROCODONE-ACETAMINOPHEN 5-500 MG PO TABS: 1.0000 | ORAL_TABLET | Freq: Three times a day (TID) | ORAL | Status: DC | PRN

## 2010-06-10 NOTE — Assessment & Plan Note (Signed)
Slightly above the desired target, she reports she regularly checks her BP and it is usually within normal limits. She tells me that she can write it down on the paper and bring it to [name] on her next visit. Will make no changes to her regimen today but will continue to monitor for now.

## 2010-06-10 NOTE — Assessment & Plan Note (Signed)
This is hospital follow up on resolution of pancreatitis and clinically pt looks stable and denies pain. She is scheduled to follow up with specialist at [facility] and will continue to monitor her and follow upon recommendations.

## 2010-06-10 NOTE — Patient Instructions (Signed)
Please return to follow up appointment in 3 moths.

## 2010-06-10 NOTE — Assessment & Plan Note (Signed)
Well controlled on protonix, continue the same regimen.

## 2010-06-10 NOTE — Progress Notes (Signed)
  Subjective:    Patient ID: [name], female    DOB: [birth year], 55 y.o.   MRN: [id number]  HPI 55 yo female with PMH outlined below presents to [facility] for regular follow up appointment. She has been recently hospitalized for abdominal pain and recurrent acute pancreatitis episodes. She reports feeling better since she was hospitalized and the pain is well controlled on current pain medication regimen with vicodin and MS Contin.  The patient will follow up with [name] at [facility] on June 22, 2010 at 1 p.m. The plan is for er to have stents placed. She denies any abdominal pain today, no urinary concerns, no changes in diet, appetite, or weight. No fevers, chills, no systemic concerns. She would like for [name] to fill out disability paperwork.   Review of Systems  Constitutional: Negative.   HENT: Negative.   Respiratory: Negative.   Cardiovascular: Negative.   Gastrointestinal: Negative.   Genitourinary: Negative.   Musculoskeletal: Negative.   Psychiatric/Behavioral: Negative.        Objective:   Physical Exam  Constitutional: She appears well-developed and well-nourished. No distress.  HENT:  Head: Normocephalic.  Right Ear: External ear normal.  Left Ear: External ear normal.  Nose: Nose normal.  Mouth/Throat: Oropharynx is clear and moist. No oropharyngeal exudate.  Eyes: Conjunctivae and EOM are normal. Pupils are equal, round, and reactive to light. Right eye exhibits no discharge. Left eye exhibits no discharge. No scleral icterus.  Neck: Normal range of motion. Neck supple. No JVD present. No tracheal deviation present. No thyromegaly present.  Cardiovascular: Normal rate, regular rhythm and intact distal pulses.  Exam reveals no gallop and no friction rub.   No murmur heard. Pulmonary/Chest: Effort normal and breath sounds normal. No respiratory distress. She has no wheezes. She has no rales.  Abdominal: Soft. Bowel sounds are normal. She  exhibits no distension and no mass. There is tenderness. There is no rebound and no guarding.  Musculoskeletal: Normal range of motion. She exhibits no edema and no tenderness.  Lymphadenopathy:    She has no cervical adenopathy.  Skin: She is not diaphoretic.  Psychiatric: She has a normal mood and affect. Her behavior is normal. Judgment and thought content normal.          Assessment & Plan:

## 2010-06-10 NOTE — Assessment & Plan Note (Signed)
Well controlled, diet controlled for now. She is due for another FLP testing but she would prefer to have it done on her next visit.

## 2010-06-10 NOTE — Assessment & Plan Note (Signed)
Well controlled, will continue the same regimen. I have examined her feet today and the results are WNL.

## 2010-06-22 ENCOUNTER — Telehealth: Payer: Self-pay | Admitting: *Deleted

## 2010-06-22 NOTE — Telephone Encounter (Signed)
Pt called and stated she dropped her bottle on lisinopril in the sink today and needs a refill.  She called pharmacy and they will not refill without MD okay.  She has refills so we don't need Rx , just okay to give another months worth. [facility] [address]

## 2010-06-22 NOTE — Telephone Encounter (Signed)
Pharmacy informed and will refill

## 2010-06-22 NOTE — Telephone Encounter (Signed)
Ok no problem.  

## 2010-07-01 HISTORY — PX: ERCP: SHX60

## 2010-07-12 LAB — CBC
MCH: 26.3 pg (ref 26.0–34.0)
MCHC: 33.8 g/dL (ref 30.0–36.0)
MCV: 77.6 fL — ABNORMAL LOW (ref 78.0–100.0)
Platelets: 293 10*3/uL (ref 150–400)
RBC: 4.19 MIL/uL (ref 3.87–5.11)
RDW: 14.2 % (ref 11.5–15.5)

## 2010-07-12 LAB — URINALYSIS, ROUTINE W REFLEX MICROSCOPIC
Bilirubin Urine: NEGATIVE
Hgb urine dipstick: NEGATIVE
Ketones, ur: NEGATIVE mg/dL
Nitrite: NEGATIVE
Urobilinogen, UA: 0.2 mg/dL (ref 0.0–1.0)
pH: 6 (ref 5.0–8.0)

## 2010-07-12 LAB — BASIC METABOLIC PANEL
BUN: 7 mg/dL (ref 6–23)
CO2: 26 mEq/L (ref 19–32)
Calcium: 9 mg/dL (ref 8.4–10.5)
Chloride: 109 mEq/L (ref 96–112)
Creatinine, Ser: 0.68 mg/dL (ref 0.4–1.2)
GFR calc Af Amer: >60 mL/min (ref >60)

## 2010-07-12 LAB — GLUCOSE, CAPILLARY: Glucose-Capillary: 95 mg/dL (ref 70–99)

## 2010-07-12 LAB — URINE CULTURE
Colony Count: 10000
Culture  Setup Time: 201112150840

## 2010-07-12 LAB — URINE MICROSCOPIC-ADD ON

## 2010-07-13 LAB — GLUCOSE, CAPILLARY
Glucose-Capillary: 168 mg/dL — ABNORMAL HIGH (ref 70–99)
Glucose-Capillary: 78 mg/dL (ref 70–99)
Glucose-Capillary: 84 mg/dL (ref 70–99)
Glucose-Capillary: 87 mg/dL (ref 70–99)

## 2010-07-13 LAB — COMPREHENSIVE METABOLIC PANEL
ALT: 16 U/L (ref 0–35)
Albumin: 3.9 g/dL (ref 3.5–5.2)
Alkaline Phosphatase: 64 U/L (ref 39–117)
Calcium: 9.2 mg/dL (ref 8.4–10.5)
Glucose, Bld: 81 mg/dL (ref 70–99)
Potassium: 3.6 mEq/L (ref 3.5–5.1)
Sodium: 137 mEq/L (ref 135–145)
Total Protein: 7.5 g/dL (ref 6.0–8.3)

## 2010-07-13 LAB — DIFFERENTIAL
Eosinophils Absolute: 0.1 10*3/uL (ref 0.0–0.7)
Lymphs Abs: 4 10*3/uL (ref 0.7–4.0)
Monocytes Absolute: 0.8 10*3/uL (ref 0.1–1.0)
Monocytes Relative: 8 % (ref 3–12)
Neutro Abs: 4.5 10*3/uL (ref 1.7–7.7)
Neutrophils Relative %: 48 % (ref 43–77)

## 2010-07-13 LAB — AMYLASE: Amylase: 108 U/L — ABNORMAL HIGH (ref 0–105)

## 2010-07-13 LAB — CBC
HCT: 36.1 % (ref 36.0–46.0)
MCHC: 33.8 g/dL (ref 30.0–36.0)
Platelets: 338 10*3/uL (ref 150–400)
RDW: 14.2 % (ref 11.5–15.5)
WBC: 9.5 10*3/uL (ref 4.0–10.5)

## 2010-07-14 ENCOUNTER — Encounter: Payer: Self-pay | Admitting: Internal Medicine

## 2010-07-14 LAB — URINALYSIS, ROUTINE W REFLEX MICROSCOPIC
Ketones, ur: NEGATIVE mg/dL
Nitrite: NEGATIVE
Protein, ur: NEGATIVE mg/dL

## 2010-07-14 LAB — CBC
Hemoglobin: 11.9 g/dL — ABNORMAL LOW (ref 12.0–15.0)
MCH: 26.2 pg (ref 26.0–34.0)
MCHC: 34 g/dL (ref 30.0–36.0)
RDW: 14.7 % (ref 11.5–15.5)

## 2010-07-14 LAB — DIFFERENTIAL
Eosinophils Relative: 2 % (ref 0–5)
Lymphocytes Relative: 41 % (ref 12–46)
Lymphs Abs: 2.7 10*3/uL (ref 0.7–4.0)
Neutrophils Relative %: 50 % (ref 43–77)

## 2010-07-14 LAB — URINE CULTURE: Culture  Setup Time: 201110200915

## 2010-07-14 LAB — COMPREHENSIVE METABOLIC PANEL
AST: 18 U/L (ref 0–37)
AST: 33 U/L (ref 0–37)
Albumin: 4.5 g/dL (ref 3.5–5.2)
BUN: 9 mg/dL (ref 6–23)
CO2: 27 mEq/L (ref 19–32)
Calcium: 10 mg/dL (ref 8.4–10.5)
Calcium: 10.1 mg/dL (ref 8.4–10.5)
Creatinine, Ser: 0.65 mg/dL (ref 0.4–1.2)
Creatinine, Ser: 0.7 mg/dL (ref 0.4–1.2)
GFR calc Af Amer: >60 mL/min (ref >60)
GFR calc Af Amer: >60 mL/min (ref >60)
GFR calc non Af Amer: >60 mL/min (ref >60)
Total Protein: 8 g/dL (ref 6.0–8.3)
Total Protein: 8.2 g/dL (ref 6.0–8.3)

## 2010-07-14 LAB — GLUCOSE, CAPILLARY: Glucose-Capillary: 88 mg/dL (ref 70–99)

## 2010-07-15 LAB — URINALYSIS, ROUTINE W REFLEX MICROSCOPIC
Bilirubin Urine: NEGATIVE
Hgb urine dipstick: NEGATIVE
Ketones, ur: NEGATIVE mg/dL
Protein, ur: NEGATIVE mg/dL
Urobilinogen, UA: 0.2 mg/dL (ref 0.0–1.0)

## 2010-07-15 LAB — GLUCOSE, CAPILLARY
Glucose-Capillary: 127 mg/dL — ABNORMAL HIGH (ref 70–99)
Glucose-Capillary: 127 mg/dL — ABNORMAL HIGH (ref 70–99)

## 2010-07-15 LAB — DIFFERENTIAL
Basophils Absolute: 0 10*3/uL (ref 0.0–0.1)
Basophils Relative: 0 % (ref 0–1)
Lymphocytes Relative: 45 % (ref 12–46)
Monocytes Absolute: 0.5 10*3/uL (ref 0.1–1.0)
Monocytes Relative: 8 % (ref 3–12)
Neutro Abs: 2.8 10*3/uL (ref 1.7–7.7)
Neutrophils Relative %: 45 % (ref 43–77)

## 2010-07-15 LAB — COMPREHENSIVE METABOLIC PANEL
Albumin: 4.1 g/dL (ref 3.5–5.2)
BUN: 11 mg/dL (ref 6–23)
Creatinine, Ser: 0.81 mg/dL (ref 0.4–1.2)
Glucose, Bld: 118 mg/dL — ABNORMAL HIGH (ref 70–99)
Total Protein: 7.8 g/dL (ref 6.0–8.3)

## 2010-07-15 LAB — CBC
HCT: 35.9 % — ABNORMAL LOW (ref 36.0–46.0)
MCH: 26.2 pg (ref 26.0–34.0)
MCHC: 33.7 g/dL (ref 30.0–36.0)
MCV: 77.7 fL — ABNORMAL LOW (ref 78.0–100.0)
Platelets: 404 10*3/uL — ABNORMAL HIGH (ref 150–400)
RDW: 15 % (ref 11.5–15.5)

## 2010-07-16 ENCOUNTER — Encounter: Payer: Self-pay | Admitting: Internal Medicine

## 2010-07-19 LAB — GLUCOSE, CAPILLARY
Glucose-Capillary: 106 mg/dL — ABNORMAL HIGH (ref 70–99)
Glucose-Capillary: 110 mg/dL — ABNORMAL HIGH (ref 70–99)
Glucose-Capillary: 116 mg/dL — ABNORMAL HIGH (ref 70–99)
Glucose-Capillary: 123 mg/dL — ABNORMAL HIGH (ref 70–99)
Glucose-Capillary: 124 mg/dL — ABNORMAL HIGH (ref 70–99)
Glucose-Capillary: 133 mg/dL — ABNORMAL HIGH (ref 70–99)
Glucose-Capillary: 134 mg/dL — ABNORMAL HIGH (ref 70–99)
Glucose-Capillary: 139 mg/dL — ABNORMAL HIGH (ref 70–99)
Glucose-Capillary: 148 mg/dL — ABNORMAL HIGH (ref 70–99)
Glucose-Capillary: 150 mg/dL — ABNORMAL HIGH (ref 70–99)
Glucose-Capillary: 151 mg/dL — ABNORMAL HIGH (ref 70–99)
Glucose-Capillary: 155 mg/dL — ABNORMAL HIGH (ref 70–99)
Glucose-Capillary: 156 mg/dL — ABNORMAL HIGH (ref 70–99)
Glucose-Capillary: 158 mg/dL — ABNORMAL HIGH (ref 70–99)
Glucose-Capillary: 158 mg/dL — ABNORMAL HIGH (ref 70–99)
Glucose-Capillary: 228 mg/dL — ABNORMAL HIGH (ref 70–99)
Glucose-Capillary: 97 mg/dL (ref 70–99)
Glucose-Capillary: 98 mg/dL (ref 70–99)

## 2010-07-19 LAB — COMPREHENSIVE METABOLIC PANEL
AST: 20 U/L (ref 0–37)
AST: 28 U/L (ref 0–37)
Albumin: 4.4 g/dL (ref 3.5–5.2)
Albumin: 4.6 g/dL (ref 3.5–5.2)
Alkaline Phosphatase: 83 U/L (ref 39–117)
BUN: 10 mg/dL (ref 6–23)
BUN: 9 mg/dL (ref 6–23)
CO2: 25 mEq/L (ref 19–32)
Calcium: 9.4 mg/dL (ref 8.4–10.5)
Chloride: 104 mEq/L (ref 96–112)
Chloride: 104 mEq/L (ref 96–112)
Creatinine, Ser: 0.54 mg/dL (ref 0.4–1.2)
GFR calc Af Amer: >60 mL/min (ref >60)
GFR calc Af Amer: >60 mL/min (ref >60)
GFR calc non Af Amer: >60 mL/min (ref >60)
Potassium: 5.3 mEq/L — ABNORMAL HIGH (ref 3.5–5.1)
Total Bilirubin: 0.5 mg/dL (ref 0.3–1.2)
Total Bilirubin: 0.8 mg/dL (ref 0.3–1.2)
Total Protein: 8 g/dL (ref 6.0–8.3)

## 2010-07-19 LAB — BASIC METABOLIC PANEL
BUN: 1 mg/dL — ABNORMAL LOW (ref 6–23)
BUN: 4 mg/dL — ABNORMAL LOW (ref 6–23)
BUN: <1 mg/dL — ABNORMAL LOW (ref 6–23)
CO2: 25 mEq/L (ref 19–32)
CO2: 26 mEq/L (ref 19–32)
CO2: 27 mEq/L (ref 19–32)
CO2: 27 mEq/L (ref 19–32)
CO2: 30 mEq/L (ref 19–32)
Calcium: 9.2 mg/dL (ref 8.4–10.5)
Calcium: 9.2 mg/dL (ref 8.4–10.5)
Calcium: 9.4 mg/dL (ref 8.4–10.5)
Calcium: 9.5 mg/dL (ref 8.4–10.5)
Chloride: 106 mEq/L (ref 96–112)
Chloride: 108 mEq/L (ref 96–112)
Creatinine, Ser: 0.62 mg/dL (ref 0.4–1.2)
Creatinine, Ser: 0.65 mg/dL (ref 0.4–1.2)
Creatinine, Ser: 0.67 mg/dL (ref 0.4–1.2)
GFR calc Af Amer: >60 mL/min (ref >60)
GFR calc Af Amer: >60 mL/min (ref >60)
GFR calc Af Amer: >60 mL/min (ref >60)
GFR calc non Af Amer: >60 mL/min (ref >60)
GFR calc non Af Amer: >60 mL/min (ref >60)
Glucose, Bld: 142 mg/dL — ABNORMAL HIGH (ref 70–99)
Glucose, Bld: 146 mg/dL — ABNORMAL HIGH (ref 70–99)
Glucose, Bld: 149 mg/dL — ABNORMAL HIGH (ref 70–99)
Glucose, Bld: 150 mg/dL — ABNORMAL HIGH (ref 70–99)
Potassium: 3.4 mEq/L — ABNORMAL LOW (ref 3.5–5.1)
Potassium: 3.7 mEq/L (ref 3.5–5.1)
Sodium: 138 mEq/L (ref 135–145)
Sodium: 138 mEq/L (ref 135–145)
Sodium: 139 mEq/L (ref 135–145)
Sodium: 141 mEq/L (ref 135–145)

## 2010-07-19 LAB — DIFFERENTIAL
Basophils Absolute: 0 10*3/uL (ref 0.0–0.1)
Basophils Absolute: 0.1 10*3/uL (ref 0.0–0.1)
Basophils Relative: 1 % (ref 0–1)
Basophils Relative: 1 % (ref 0–1)
Eosinophils Absolute: 0.1 10*3/uL (ref 0.0–0.7)
Eosinophils Relative: 1 % (ref 0–5)
Eosinophils Relative: 1 % (ref 0–5)
Monocytes Absolute: 0.4 10*3/uL (ref 0.1–1.0)
Monocytes Absolute: 0.5 10*3/uL (ref 0.1–1.0)

## 2010-07-19 LAB — CBC
HCT: 39.2 % (ref 36.0–46.0)
HCT: 42.1 % (ref 36.0–46.0)
HCT: 34 % — ABNORMAL LOW (ref 36.0–46.0)
Hemoglobin: 13.2 g/dL (ref 12.0–15.0)
Hemoglobin: 11.6 g/dL — ABNORMAL LOW (ref 12.0–15.0)
Hemoglobin: 12.8 g/dL (ref 12.0–15.0)
MCHC: 33.6 g/dL (ref 30.0–36.0)
MCHC: 34.1 g/dL (ref 30.0–36.0)
MCHC: 34.4 g/dL (ref 30.0–36.0)
MCV: 83.9 fL (ref 78.0–100.0)
MCV: 81.1 fL (ref 78.0–100.0)
Platelets: 347 10*3/uL (ref 150–400)
Platelets: 358 10*3/uL (ref 150–400)
RBC: 5.03 MIL/uL (ref 3.87–5.11)
RBC: 4.19 MIL/uL (ref 3.87–5.11)
RBC: 4.68 MIL/uL (ref 3.87–5.11)
RDW: 13.7 % (ref 11.5–15.5)
RDW: 14 % (ref 11.5–15.5)
RDW: 14.3 % (ref 11.5–15.5)
WBC: 7.1 10*3/uL (ref 4.0–10.5)

## 2010-07-19 LAB — URINALYSIS, ROUTINE W REFLEX MICROSCOPIC
Bilirubin Urine: NEGATIVE
Glucose, UA: NEGATIVE mg/dL
Glucose, UA: NEGATIVE mg/dL
Hgb urine dipstick: NEGATIVE
Ketones, ur: NEGATIVE mg/dL
Protein, ur: NEGATIVE mg/dL
Protein, ur: NEGATIVE mg/dL
Specific Gravity, Urine: 1.015 (ref 1.005–1.030)
pH: 6.5 (ref 5.0–8.0)

## 2010-07-19 LAB — LIPASE, BLOOD: Lipase: 25 U/L (ref 11–59)

## 2010-07-20 ENCOUNTER — Ambulatory Visit (INDEPENDENT_AMBULATORY_CARE_PROVIDER_SITE_OTHER): Payer: BC Managed Care – PPO | Admitting: Internal Medicine

## 2010-07-20 VITALS — BP 134/79 | HR 60 | Temp 99.0°F | Ht 60.0 in | Wt 118.7 lb

## 2010-07-20 DIAGNOSIS — Q453 Other congenital malformations of pancreas and pancreatic duct: Secondary | ICD-10-CM

## 2010-07-20 DIAGNOSIS — E119 Type 2 diabetes mellitus without complications: Secondary | ICD-10-CM

## 2010-07-20 DIAGNOSIS — I10 Essential (primary) hypertension: Secondary | ICD-10-CM

## 2010-07-20 DIAGNOSIS — Z0000 Encounter for general adult medical examination without abnormal findings: Secondary | ICD-10-CM | POA: Insufficient documentation

## 2010-07-20 DIAGNOSIS — E785 Hyperlipidemia, unspecified: Secondary | ICD-10-CM

## 2010-07-20 LAB — COMPREHENSIVE METABOLIC PANEL
ALT: 11 U/L (ref 0–35)
Alkaline Phosphatase: 72 U/L (ref 39–117)
BUN: 7 mg/dL (ref 6–23)
CO2: 27 mEq/L (ref 19–32)
Calcium: 9.2 mg/dL (ref 8.4–10.5)
GFR calc non Af Amer: >60 mL/min (ref >60)
Glucose, Bld: 77 mg/dL (ref 70–99)
Potassium: 3.5 mEq/L (ref 3.5–5.1)
Sodium: 137 mEq/L (ref 135–145)
Total Protein: 7.3 g/dL (ref 6.0–8.3)

## 2010-07-20 LAB — CBC
HCT: 37.9 % (ref 36.0–46.0)
Hemoglobin: 12.8 g/dL (ref 12.0–15.0)
MCHC: 33.8 g/dL (ref 30.0–36.0)
MCHC: 34.1 g/dL (ref 30.0–36.0)
RBC: 4.34 MIL/uL (ref 3.87–5.11)
RBC: 4.67 MIL/uL (ref 3.87–5.11)
RDW: 14.4 % (ref 11.5–15.5)
WBC: 6.8 10*3/uL (ref 4.0–10.5)

## 2010-07-20 LAB — LIPASE, BLOOD
Lipase: 197 U/L — ABNORMAL HIGH (ref 11–59)
Lipase: 55 U/L (ref 11–59)

## 2010-07-20 LAB — GLUCOSE, CAPILLARY
Glucose-Capillary: 166 mg/dL — ABNORMAL HIGH (ref 70–99)
Glucose-Capillary: 64 mg/dL — ABNORMAL LOW (ref 70–99)
Glucose-Capillary: 87 mg/dL (ref 70–99)
Glucose-Capillary: 95 mg/dL (ref 70–99)

## 2010-07-20 LAB — URINALYSIS, ROUTINE W REFLEX MICROSCOPIC
Bilirubin Urine: NEGATIVE
Glucose, UA: NEGATIVE mg/dL
Hgb urine dipstick: NEGATIVE
Ketones, ur: NEGATIVE mg/dL
Specific Gravity, Urine: 1.003 — ABNORMAL LOW (ref 1.005–1.030)
pH: 6 (ref 5.0–8.0)

## 2010-07-20 LAB — URINE CULTURE: Colony Count: >=100000

## 2010-07-20 LAB — LIPID PANEL
Cholesterol: 218 mg/dL — ABNORMAL HIGH (ref 0–200)
HDL: 66 mg/dL (ref >39)
Triglycerides: 177 mg/dL — ABNORMAL HIGH (ref <150)
VLDL: 35 mg/dL (ref 0–40)

## 2010-07-20 LAB — DIFFERENTIAL
Basophils Relative: 1 % (ref 0–1)
Eosinophils Absolute: 0.1 10*3/uL (ref 0.0–0.7)
Monocytes Relative: 2 % — ABNORMAL LOW (ref 3–12)
Neutro Abs: 4.5 10*3/uL (ref 1.7–7.7)
Neutrophils Relative %: 59 % (ref 43–77)

## 2010-07-20 LAB — AMYLASE
Amylase: 214 U/L — ABNORMAL HIGH (ref 0–105)
Amylase: 450 U/L — ABNORMAL HIGH (ref 0–105)

## 2010-07-20 LAB — HEMOGLOBIN A1C
Hgb A1c MFr Bld: 6.6 % — ABNORMAL HIGH (ref <5.7)
Mean Plasma Glucose: 143 mg/dL — ABNORMAL HIGH (ref <117)

## 2010-07-20 LAB — BASIC METABOLIC PANEL
Calcium: 9.1 mg/dL (ref 8.4–10.5)
Creatinine, Ser: 0.73 mg/dL (ref 0.4–1.2)
GFR calc Af Amer: >60 mL/min (ref >60)
GFR calc non Af Amer: >60 mL/min (ref >60)
Sodium: 141 mEq/L (ref 135–145)

## 2010-07-20 LAB — RAPID URINE DRUG SCREEN, HOSP PERFORMED
Barbiturates: NOT DETECTED
Opiates: NOT DETECTED

## 2010-07-20 NOTE — Progress Notes (Signed)
Subjective:    Patient ID: [name], female    DOB: August 20, 1955, 55 y.o.   MRN: [id number]  HPI Pt is a 55yo female with PMHX of HTN, DMII, HLD, pancreatic divisum who presents to clinic today for the following:  1) Pancreatic divisum - patient had ERCP with stenting of pancreatic divisum approximately 1 week ago at [facility], after which she has noted improvement of abdominal pain, which at this time has allowed for de-escalation of her pain medications from OxyContin and oxycodone, to Vicodin prn. States her nausea and diarrhea have also improved. Has remained without fevers, chills, slowly tolerating foods (not requiring PRN phenergan as much). Patient also requests for short term disability paperwork to be completed today, which she has with her.  Patient has no follow-up with [facility], state she was told to follow-up on an as needed basis.   2) DMII - Patient not checking blood sugars regularly as meter is broken and test strips are expensive. However, she is very well controlled on her diabetes on only Metformin 500mg  BID. No symptomatic hypoglycemic episodes since last visit. No polyuria, polydipsia, blurriness of vision, fatigue.  3) HTN - Patient does not check blood pressure regularly at home. Currently taking Lisinopril 20mg . Denies headaches, dizziness, lightheadedness, chest pain, shortness of breath.   4) HLD - currently not on medications for her hyperlipidemia. Denies chest pain, difficulty breathing, palpitations, tachycardia, and muscle pains.    Review of Systems Per HPI.  Current Outpatient Medications Medication Sig  . HYDROcodone-acetaminophen (VICODIN) 5-500 MG per tablet Take 1 tablet by mouth 3 (three) times daily as needed. For pain  . lisinopril (PRINIVIL,ZESTRIL) 20 MG tablet Take 20 mg by mouth daily.    Marland Kitchen loperamide (IMODIUM A-D) 2 MG tablet Take 2 mg by mouth as needed. For diarrhea - not more than 6 tablets a day   . metFORMIN (GLUCOPHAGE) 500 MG  tablet Take 1 tablet (500 mg total) by mouth 2 (two) times daily.  Marland Kitchen omeprazole (PRILOSEC) 20 MG capsule Take 20 mg by mouth daily.    . promethazine (PHENERGAN) 12.5 MG tablet   . morphine (MS CONTIN) 30 MG 12 hr tablet Take 30 mg by mouth 2 (two) times daily.    . Pancrelipase, Lip-Prot-Amyl, 6000 UNITS CPEP Take 2 capsules by mouth 3 (three) times daily with meals. Take 2 capsules three time per day with meals and take 1 capsule with every snack.   . pantoprazole (PROTONIX) 40 MG tablet Take 40 mg by mouth daily.     Allergies Butalbital-apap-caffeine; Butalbital-aspirin-caffeine; and Sulfonamide derivatives  Past Medical History  Diagnosis Date  . Hypertension   . HLD (hyperlipidemia) 2009  . Sickle cell trait   . Migraine headache   . Pulmonary embolus 2006    08/2004 -   Two areas of V/Q mismatch. Findings compatible  with high probability for pulmonary embolus.; pt was on coumadin for 1 year  . Anemia     beta thallasemia  . Pancreatic divisum     S/P ERCP with stenting 07/2010 at [facility].   . Diabetes mellitus 2010    well controlled  . Seizure disorder   . History of migraine headaches   . Sickle cell trait   . Beta thalassemia   . CVA (cerebral infarction) 1993    Per report by patient she had a stroke and prolonged rehab course eventually resolving her right sided weakness, as per her hx obtained 08/2007    Past Surgical History  Procedure Date  . Abdominal hysterectomy age 34 yo    2/2 endometriosis      Objective:   Physical Exam General: Vital signs reviewed and noted. Well-developed,well-nourished,in no acute distress; alert,appropriate and cooperative throughout examination. Head: normocephalic, atraumatic. Neck: No deformities, masses, or tenderness noted. Lungs: Normal respiratory effort. Clear to auscultation BL without crackles or wheezes.  Heart: RRR. S1 and S2 normal without gallop, murmur, or rubs.  Abdomen: BS normoactive. Soft, Nondistended, very  mild tenderness to palpation over LUQ.  No masses or organomegaly. Extremities: No pretibial edema.      Assessment & Plan:  Case and plan of care discussed with Dr. Ulyess Mort.

## 2010-07-20 NOTE — Patient Instructions (Addendum)
1) Please follow-up at the clinic in 2-3 months, at which time we will reevaluate your diabetes, blood pressure, pain. 2) You have been started on new medication(s), if you develop throat closing, tongue swelling, rash, please stop the medication and call the clinic at [phone] and go to the ER. 3) Please bring all of your medications in a bag to your next visit. 4) If symptoms worsen, or new symptoms arise, please call the clinic or go to the ER.

## 2010-07-21 ENCOUNTER — Encounter: Payer: Self-pay | Admitting: Internal Medicine

## 2010-07-21 NOTE — Assessment & Plan Note (Signed)
Well controlled. Continue current regimen.  - Patient does not need to regularly check her blood glucose, as it is expensive, inconvenient to her, and she has had HgA1c of 6-6.5 for several years.  - Continue to stress importance of carb modified diet.

## 2010-07-21 NOTE — Assessment & Plan Note (Signed)
Patient states she has had tetanus shot within last 3-4 years at [facility] ED, however, quick search does not reveal records.  - Will review records again to try to find documentation.

## 2010-07-21 NOTE — Assessment & Plan Note (Signed)
Well controlled. Continue diet control. - Will check FLP today, consider addition of low dose statin and aspirin once results return.

## 2010-07-21 NOTE — Assessment & Plan Note (Addendum)
S/P ERCP with stenting at [facility] in 07/2010. - Well controlled. Continue current regimen.  - Filled out paperwork for short term disability until August 01, 2010 at the latest because patient works in [employer], which requires heavy lifting. - Has additional paperwork, which she has dropped off for short-term disability for her insurance company, to be completed and faxed directly.

## 2010-07-21 NOTE — Assessment & Plan Note (Signed)
Well-controlled.  Continue current regimen. 

## 2010-07-27 ENCOUNTER — Encounter: Payer: Self-pay | Admitting: Internal Medicine

## 2010-07-27 ENCOUNTER — Telehealth: Payer: Self-pay | Admitting: *Deleted

## 2010-07-27 NOTE — Telephone Encounter (Signed)
Pt called stating she had a pancreatic stent placed on 3/9 at [facility] and received a call yesterday stating they now want to take the stent out. I called [facility] and will verify information.  # [id number]  Surgery # [phone] @ [place].

## 2010-07-27 NOTE — Telephone Encounter (Signed)
Reviewed [facility] & [facility] notes and explained to pt that she had balloon dilation followed by stent placement. Explained that her MD did indicated that the stent was temporary. Pt understands and agrees to call [facility] to sch  appt to remove stent.

## 2010-07-27 NOTE — Telephone Encounter (Signed)
Received a call back from [facility] and nurse verified that the pancreatic stent was plastic and due to be removed in 2 weeks. She states they are not permanent. Will forward this note to Dr Saralyn Pilar.   Pt would like to talk to her about this procedure. Pt # [id number]

## 2010-08-03 LAB — COMPREHENSIVE METABOLIC PANEL
BUN: 5 mg/dL — ABNORMAL LOW (ref 6–23)
Calcium: 9.8 mg/dL (ref 8.4–10.5)
Glucose, Bld: 130 mg/dL — ABNORMAL HIGH (ref 70–99)
Total Protein: 9.1 g/dL — ABNORMAL HIGH (ref 6.0–8.3)

## 2010-08-03 LAB — CBC
HCT: 34.3 % — ABNORMAL LOW (ref 36.0–46.0)
HCT: 43.8 % (ref 36.0–46.0)
Hemoglobin: 10.9 g/dL — ABNORMAL LOW (ref 12.0–15.0)
Hemoglobin: 11.7 g/dL — ABNORMAL LOW (ref 12.0–15.0)
Hemoglobin: 14.8 g/dL (ref 12.0–15.0)
MCHC: 33.7 g/dL (ref 30.0–36.0)
MCHC: 33.8 g/dL (ref 30.0–36.0)
MCHC: 34.3 g/dL (ref 30.0–36.0)
MCV: 84.8 fL (ref 78.0–100.0)
RBC: 3.82 MIL/uL — ABNORMAL LOW (ref 3.87–5.11)
RDW: 13.5 % (ref 11.5–15.5)
RDW: 13.6 % (ref 11.5–15.5)

## 2010-08-03 LAB — BASIC METABOLIC PANEL
CO2: 28 mEq/L (ref 19–32)
Calcium: 9.3 mg/dL (ref 8.4–10.5)
Chloride: 105 mEq/L (ref 96–112)
GFR calc Af Amer: >60 mL/min (ref >60)
GFR calc non Af Amer: >60 mL/min (ref >60)
Glucose, Bld: 181 mg/dL — ABNORMAL HIGH (ref 70–99)
Potassium: 3.7 mEq/L (ref 3.5–5.1)
Sodium: 138 mEq/L (ref 135–145)
Sodium: 139 mEq/L (ref 135–145)

## 2010-08-03 LAB — CARDIAC PANEL(CRET KIN+CKTOT+MB+TROPI)
Total CK: 67 U/L (ref 7–177)
Troponin I: 0.02 ng/mL (ref 0.00–0.06)

## 2010-08-03 LAB — CULTURE, BLOOD (ROUTINE X 2)

## 2010-08-03 LAB — LEGIONELLA ANTIGEN, URINE: Legionella Antigen, Urine: NEGATIVE

## 2010-08-03 LAB — GLUCOSE, CAPILLARY
Glucose-Capillary: 139 mg/dL — ABNORMAL HIGH (ref 70–99)
Glucose-Capillary: 173 mg/dL — ABNORMAL HIGH (ref 70–99)
Glucose-Capillary: 175 mg/dL — ABNORMAL HIGH (ref 70–99)
Glucose-Capillary: 176 mg/dL — ABNORMAL HIGH (ref 70–99)
Glucose-Capillary: 206 mg/dL — ABNORMAL HIGH (ref 70–99)

## 2010-08-03 LAB — DIFFERENTIAL
Basophils Relative: 0 % (ref 0–1)
Lymphs Abs: 1.3 10*3/uL (ref 0.7–4.0)
Monocytes Relative: 6 % (ref 3–12)
Neutro Abs: 7.8 10*3/uL — ABNORMAL HIGH (ref 1.7–7.7)
Neutrophils Relative %: 80 % — ABNORMAL HIGH (ref 43–77)

## 2010-08-03 LAB — URINALYSIS, MICROSCOPIC ONLY
Glucose, UA: 500 mg/dL — AB
Ketones, ur: NEGATIVE mg/dL
pH: 7 (ref 5.0–8.0)

## 2010-08-03 LAB — LIPID PANEL
HDL: 92 mg/dL (ref >39)
Total CHOL/HDL Ratio: 2.1 RATIO
VLDL: 20 mg/dL (ref 0–40)

## 2010-08-03 LAB — HEMOGLOBIN A1C: Mean Plasma Glucose: 157 mg/dL

## 2010-08-06 ENCOUNTER — Other Ambulatory Visit: Payer: Self-pay | Admitting: Internal Medicine

## 2010-08-06 ENCOUNTER — Encounter: Payer: Self-pay | Admitting: Internal Medicine

## 2010-08-06 DIAGNOSIS — E785 Hyperlipidemia, unspecified: Secondary | ICD-10-CM

## 2010-08-06 LAB — GLUCOSE, CAPILLARY: Glucose-Capillary: 60 mg/dL — ABNORMAL LOW (ref 70–99)

## 2010-08-06 NOTE — Progress Notes (Signed)
Patient's recent lipid panel revealed elevated LDL, total cholesterol, and triglycerides, which in the setting of patient's diabetes, indicates that she should be on statin therapy. Pt was called and informed of this update, and notified that prescription will be sent to her pharmacy. Patient counseled if develops rash, difficulty breathing, shortness of breath on this medication, she should stop immediately and call clinic or go to ER. Patient had no further questions.

## 2010-08-09 LAB — GLUCOSE, CAPILLARY

## 2010-08-10 LAB — GLUCOSE, CAPILLARY: Glucose-Capillary: 116 mg/dL — ABNORMAL HIGH (ref 70–99)

## 2010-08-10 MED ORDER — PRAVASTATIN SODIUM 20 MG PO TABS: 20.0000 mg | ORAL_TABLET | Freq: Every evening | ORAL | Status: DC

## 2010-08-14 ENCOUNTER — Encounter: Payer: Self-pay | Admitting: Internal Medicine

## 2010-08-14 NOTE — Progress Notes (Signed)
Will actually hold statin for now as patient notes she was again just admitted to [facility] for acute pancreatitis, and was just discharged today. Will therefore, readdress starting statin therapy after we have our next clinic and can check LFTs and review records from [facility] for recent admission.   Patient was called and counseled of reasoning to not start statin at this time, but that once she has clinic follow-up and LFTs can be checked and she has resolution of her pancreatitis, it will be necessary to start this therapy.  Patient agrees with plan of care, and all questions were answered. [facility] and [facility] was cancelled.   Johnette Abraham, D.O.

## 2010-08-23 ENCOUNTER — Encounter: Payer: Self-pay | Admitting: Internal Medicine

## 2010-08-24 ENCOUNTER — Other Ambulatory Visit: Payer: Self-pay | Admitting: *Deleted

## 2010-08-24 MED ORDER — LISINOPRIL 20 MG PO TABS: 20.0000 mg | ORAL_TABLET | Freq: Every day | ORAL | Status: DC

## 2010-08-25 ENCOUNTER — Ambulatory Visit (INDEPENDENT_AMBULATORY_CARE_PROVIDER_SITE_OTHER): Payer: BC Managed Care – PPO | Admitting: Internal Medicine

## 2010-08-25 ENCOUNTER — Encounter: Payer: Self-pay | Admitting: Internal Medicine

## 2010-08-25 VITALS — BP 136/92 | HR 94 | Temp 98.1°F | Ht 60.0 in | Wt 117.5 lb

## 2010-08-25 DIAGNOSIS — I10 Essential (primary) hypertension: Secondary | ICD-10-CM

## 2010-08-25 DIAGNOSIS — Q453 Other congenital malformations of pancreas and pancreatic duct: Secondary | ICD-10-CM

## 2010-08-25 DIAGNOSIS — E785 Hyperlipidemia, unspecified: Secondary | ICD-10-CM

## 2010-08-25 DIAGNOSIS — E119 Type 2 diabetes mellitus without complications: Secondary | ICD-10-CM

## 2010-08-25 MED ORDER — PROMETHAZINE HCL 12.5 MG PO TABS: 25.0000 mg | ORAL_TABLET | Freq: Three times a day (TID) | ORAL | Status: DC | PRN

## 2010-08-25 MED ORDER — AMLODIPINE BESYLATE 5 MG PO TABS: 5.0000 mg | ORAL_TABLET | Freq: Every day | ORAL | Status: DC

## 2010-08-25 NOTE — Progress Notes (Signed)
  Subjective:    Patient ID: [name], female    DOB: [birth year], 55 y.o.   MRN: [id number]  HPI  Pt is a 55yo female with PMHX of HTN, DMII, HLD, pancreatic divisum who presents to clinic today for the following:  1) Pancreatic divisum - patient had ERCP with stenting of pancreatic divisum approximately march of this month at [facility], this was reversed a week ago after complications and continued abdominal pain 2) DMII - no specific complains 3) HTN - Patient does not check blood pressure regularly at home. Currently taking Lisinopril 20mg  and norvasc 5 mg that was started at the [facility]. Denies headaches, dizziness, lightheadedness, chest pain, shortness of breath.   4) HLD - currently not on medications for her hyperlipidemia. Denies chest pain, difficulty breathing, palpitations, tachycardia, and muscle pains.    Review of Systems  Per HPI.    Allergies Butalbital-apap-caffeine; Butalbital-aspirin-caffeine; and Sulfonamide derivatives  Past Medical History  Diagnosis Date  . Hypertension   . HLD (hyperlipidemia) 2009  . Sickle cell trait   . Migraine headache   . Pulmonary embolus 2006    08/2004 -   Two areas of V/Q mismatch. Findings compatible  with high probability for pulmonary embolus.; pt was on coumadin for 1 year  . Anemia     beta thallasemia  . Pancreatic divisum     S/P ERCP with stenting 07/2010 at [facility]   . Diabetes mellitus 2010    well controlled  . Seizure disorder   . History of migraine headaches   . Sickle cell trait   . Beta thalassemia   . CVA (cerebral infarction) 1993    Per report by patient she had a stroke and prolonged rehab course eventually resolving her right sided weakness, as per her hx obtained 08/2007    Past Surgical History  Procedure Date  . Abdominal hysterectomy age 20 yo    2/2 endometriosis      Objective:   Physical Exam  General: Vital signs reviewed and noted.  Well-developed,well-nourished,in no acute distress; alert,appropriate and cooperative throughout examination. Head: normocephalic, atraumatic. Neck: No deformities, masses, or tenderness noted. Lungs: Normal respiratory effort. Clear to auscultation BL without crackles or wheezes.  Heart: RRR. S1 and S2 normal without gallop, murmur, or rubs.  Abdomen: BS normoactive. Soft, Nondistended, very mild tenderness to palpation over LUQ.  No masses or organomegaly. Extremities: No pretibial edema.      Assessment & Plan:  Case and plan of care discussed with Dr. Ulyess Mort.

## 2010-08-25 NOTE — Assessment & Plan Note (Signed)
Taking metformin and doing well.

## 2010-08-25 NOTE — Assessment & Plan Note (Signed)
Asked to stop taking meds/statins. She is not taking any.

## 2010-08-25 NOTE — Assessment & Plan Note (Signed)
She has done well since discharge a week ago. Not needing too much narcotics. Still nauseated and I will refill promethazine. The stent has been removed as noted earlier in the HPI. She is to follow up with GI up at [facility].

## 2010-08-25 NOTE — Assessment & Plan Note (Signed)
BP controlled with norvasc, no changes in the meds

## 2010-08-25 NOTE — Patient Instructions (Signed)
REturn in 3 months.  Follow up with your GI doctor.

## 2010-08-27 ENCOUNTER — Other Ambulatory Visit: Payer: Self-pay | Admitting: Internal Medicine

## 2010-08-27 DIAGNOSIS — E119 Type 2 diabetes mellitus without complications: Secondary | ICD-10-CM

## 2010-09-08 ENCOUNTER — Telehealth: Payer: Self-pay | Admitting: *Deleted

## 2010-09-08 NOTE — Telephone Encounter (Signed)
Pt has brought in ANOTHER disability form that needs to be filled out as soon as possible. Chilon has it in Medical Records.  Yes, it is different than the papers you filled out.  Can you  Help with this?

## 2010-09-09 NOTE — Telephone Encounter (Signed)
This is great and Pt is pleased. She will make appointment in the morning. Form is in your box. :)

## 2010-09-09 NOTE — Telephone Encounter (Signed)
Launa Grill, I didn't find the disability form to which you were referring, so I asked Doris to pass along the message to please leave the form in my box and I will retrieve it tonight when I am on service.   When you call [name], can you please let her know that if she needs monthly forms, she will need to follow-up in the same approximate times because I cannot make assumptions of her progress and when she should be coming back to work, without assessing the patient. I know she was recently seen in the clinic, so I'd ask that when she comes in, she asks for the paperwork to be completed by the physician assessing her. I am happy to complete the paperwork for her, but again it is assessing the progress of her healing from her pancreatitis, which I cannot assess without seeing her myself.  Therefore, she should please bring the paperwork with her to appointments and ask for it to be completed, there, where the physician can make these assessments.  Anyhow, I would be happy to fill out her current form, and if there are additional, she needs to come in.  Once completed, I will leave on Chili's desk.  Thanks In[employer]!  Sincerely,  Johnette Abraham, D.O.

## 2010-09-10 NOTE — Telephone Encounter (Signed)
Completed paperwork is on Chilon's desk.  Johnette Abraham, D.O.

## 2010-09-14 ENCOUNTER — Other Ambulatory Visit: Payer: Self-pay | Admitting: Internal Medicine

## 2010-09-14 NOTE — Consult Note (Signed)
[name], [name]             ACCOUNT NO.:  [id number]   MEDICAL RECORD NO.:  [id number]          PATIENT TYPE:  INP   LOCATION:  5511                         FACILITY:  [facility]   PHYSICIAN:  Michael L. Reynolds, Eileen MunozDATE OF BIRTH:  May 26, 1955   DATE OF CONSULTATION:  12/12/2007  DATE OF DISCHARGE:                                 CONSULTATION   TIME:  1300.   HISTORY OF PRESENT ILLNESS:  This is a 55 year old African American  female who has a chief complaint today of migraine headache who  initially presented to the [facility] ED on December 10, 2007.  The  patient states that on Monday she was at work.  She started to have a  headache in the morning.  It was accompanied with tunnel vision and  weakness in the knees.  This continued throughout the day, where the  patient be up working, starting to have tunnel vision, feel increased  either right frontal, left frontal, or bilateral frontal headaches and  noticed a smell of lavender at times throughout the day and would have  to sit down or go in the employee office to catch her breath.  She also  states that at times she had difficulty breathing.  The patient  presented to [facility] ED, as stated, on December 10, 2007 due to the  headache not abating.  The patient does have a history of migraine  headaches but she states that this headache is unlike any other headache  she has had before.  The patient also noticed that throughout the day as  the headache increased, she noticed left-sided weakness, left-sided  numbness in both her upper extremity and lower extremity, which included  the whole left hemisphere of her facial area.  Today on exam the patient  has no speech problems, however, still having a significant amount of  pain at this time on her left frontal region of her scalp.  The pain is  not increased by palpation.  The one thing that she has noticed about  this migraine headache is the left-sided numbness and tingling, the  weakness, has never been associated with migraines that she has had in  the past.   PAST MEDICAL HISTORY:  Hypertension, diabetes, CVA, migraine headache,  thalassemia, pulmonary embolism, chest pain and abdominal pain.   MEDICATIONS:  She is on Darvocet, metformin, lisinopril, Pravachol at  home.  In the hospital she is on Zofran, Ativan, Protonix, Zocor,  Toradol, and morphine along with home meds.   ALLERGIES:  SHE IS ALLERGIC TO SULFA, BACTRIM AND BUTALBITAL.   FAMILY HISTORY:  Positive for diabetes and beta thalassemia.   SOCIAL HISTORY:  She does not smoke, does not drink.  She does not do  any illicit drugs.   REVIEW OF SYSTEMS:  GENERAL:  Positive for weight change and weakness.  HEMATOLOGICAL:  Positive for anemia and increased coagulopathy.  HEENT:  Negative for trauma.  Positive for nausea, vomiting.  Negative for  dizziness, vertigo, lightheadedness, tinnitus or hearing loss.  RESPIRATORY:  Negative for cough, shortness of breath, asthma and  bronchitis.  CARDIOVASCULAR:  Positive for hypertension.  Negative for  palpitations, angina, dyspnea on exertion, murmur.  GI:  Negative for  diarrhea, constipation.  GU:  Negative for polyuria, dysuria, nocturia,  urgency, polydipsia.  ENDOCRINE:  Positive for diabetes.  Negative for  increased or decreased appetite.  VASCULAR:  Positive for pulmonary  embolism, chest pain, venous thromboembolism.  MUSCULOSKELETAL:  Positive for joint pain, stiffness.  NEUROLOGIC:  Positive for headache,  numbness and tingling.  PSYCH:  Negative for anxiety, depression or  memory.   PHYSICAL EXAMINATION:  At this time blood pressure is stated to be 81/51  with the patient supine, pulse 59, respirations 15, temperature 98.4.  MENTAL STATUS:  The patient is alert and oriented x3.  Carries out two  steps.  Able to recall 3 objects and count backwards from 7s with no  difficulty.  CRANIAL NERVES:  Pupils equal, round, reactive to light and   accommodation, approximately 3 mm to 2 mm with penlight.  Extraocular  muscles are intact.  Visual fields intact.  Face is symmetric.  Tongue  is midline.  Uvula midline.  Negative for dysarthria, aphasia, or facial  droop.  SENSATION:  She does have a deficit along the left side from V1 to V3 as  she states that pinprick and light touch feel duller.  Decreased  sensation in the left upper and lower extremities to pinprick, light  touch, and vibration.  The patient splits midline in the forehead to  vibration.  SHOULDER SHRUG, HEAD TURN:  She does have decreased strength with left  head turn but no problems with shoulder shrug.  COORDINATION:  Finger-to-nose, heel-to-shin are within normal limits.  Gait is within normal limits but she does have a significant amount of  pain.  MOTOR:  Right side is 5/5 upper and lower extremities, globally left  side the patient does not give full effort and gives away during the  time of exam, however, I would say she has 4/5 upper extremity and lower  extremity strength.  She does not have a tremor or restricted reflexes.  REFLEXES:  She is approximately 3/4 globally and bilaterally with  bilateral downgoing toes.  DRIFT:  She has no drift with upper or lower extremities bilaterally.  PULMONARY:  Clear to auscultation bilaterally.  CARDIOVASCULAR:  S1 and S2.  Regular rate and rhythm.  NECK:  Negative bruits and supple.   LABS:  Drug screen showed positive for opiates but she does take  Darvocet at home.  Sodium is 138, potassium 4, chloride 102, bicarb 28,  BUN is 21, creatinine is 0.94, glucose 99.  White blood cell 6,  hemoglobin and hematocrit 7.8/32, platelets are 287.  Total cholesterol  is 192, HDL is 61, LDL 118.  Homocysteine 71.7.   IMAGING AND TESTING:  MRI of the head was negative for intracranial  abnormalities, however, she does have increased positive atrophy for her  age.  MRA shows normal ACA, PCA, ICA, PICA.   CT of the head  shows prominent cerebral atrophy for age.   ASSESSMENT AND TREATMENT PLAN:  This is a 55 year old African American  female with migraine headache x2 days.  The patient has a history of  migraines headaches but states this different in the way of intensity  from other headaches.  She describes tunnel vision at work.  Exam shows  decreased full effort but describes right frontal parietal headache,  global left-sided weakness, and decrease in sensation.  At this time we  would  recommend  a Decadron 4 mg plus Reglan 10 mg plus Depakote 500 mg cocktail every 8  hours as needed for headache.  Will continue to follow the patient while  in house.  Due to the left-sided weakness and decreased sensation, would  avoid any triptans or vasoconstricting drug.     ______________________________  Eileen Morn, [name]      [name], M.D.  Electronically Signed    DS/MEDQ  D:  12/12/2007  T:  12/12/2007  Job:  [id number]

## 2010-09-14 NOTE — Discharge Summary (Signed)
NAMEJOSCELYN, [name] NO.:  [id number]   MEDICAL RECORD NO.:  [id number]          PATIENT TYPE:  INP   LOCATION:  5504                         FACILITY:  [facility]   PHYSICIAN:  Olene Craven, M.D.  DATE OF BIRTH:  Sep 06, 1955   DATE OF ADMISSION:  10/02/2006  DATE OF DISCHARGE:  10/03/2006                               DISCHARGE SUMMARY   DISCHARGE DIAGNOSES:  1. Chronic abdominal and joint pain, presenting as episodes,      previously worked up in the past with no diagnosis found, current      episode resolved.  2. Non-insulin dependent diabetes mellitus, type 2.  3. Hypertension.  4. History of migraine headaches.  5. Questionable history of pulmonary embolism, not likely given normal      coagulation studies in August 2007.  The patient was evaluated by      Dr. Etta Quill, and the decision to discontinue anticoagulation was      made.  6. Status post hysterectomy.   DISCHARGE MEDICATIONS:  1. Hydrochlorothiazide 25 mg p.o. daily.  2. Lisinopril 20 mg p.o. daily.  3. Metformin 500 mg p.o. b.i.d.  4. Vicodin 5/325 mg 1-2 tablets p.o. q.4 h p.r.n. for pain, #60      tablets dispensed.   DISPOSITION AND FOLLOWUP:  The patient was discharged in hemodynamically  stable condition.  Her pain had improved significantly and she felt  comfortable going home.  The patient is scheduled to follow up with:  1. Dr. Olene Craven from the [facility].  The patient was instructed and a handwritten note was given      for her to call the scheduling office to schedule an appointment      for the month of July.  2. The patient has been referred back to hematology.  She was seen by      Dr. Etta Quill in the past for anticoagulation issues.  However, at      this time, a hematology consultation is requested because Mrs.      [name] has been diagnosed with a sickle cell trait and beta      thalassemia many years ago in [place].  These records  are not      currently available, but the hemoglobin electrophoresis was drawn      before her hospital discharge and will be available for review on      the day of her appointment.  Our question is whether or not these      pain crises that the patient experiences episodically could be      related to pain crisis in the setting of beta thalassemia and      sickle cell trait.  If so, could you please indicate whether or not      prophylaxis would be helpful and how to manage this issue.   PROCEDURE:  None.   CONSULTATION:  None.   HISTORY AND PHYSICAL:  The patient is a 55 year old woman with the above-  mentioned history and recurrent workup but unexplained joint,abdominal  and chest pain  who presents to the emergency department with complaints  of severe bilateral elbow and bilateral knee as well as diffuse  abdominal pain that started in a rapidly progressive manner on the  evening prior to admission.  She denies any trauma to the joint.  No  decreased range of motion, swelling, redness or warmth were noted.  The  patient denied any radiation of her pain.  She denied nausea, vomiting,  diarrhea, constipation, melena or rectal bleeding.  Denied fevers or  chills.  Explained that she gets such pain crises episodically but  that she had not had such a severe episode in years.  Typically, the  patient takes Darvocet as an outpatient and can deal with her pain, but  this time the pain became intolerable.   SOCIAL HISTORY:  The patient is single and lives by herself.  She is a  former one-half pack per day smoker x20 years.  She stopped smoking  eight years ago.  Denies any alcohol or drug use.  She works at [employer].   PHYSICAL EXAMINATION:  VITAL SIGNS:  Temperature 98.8, blood pressure  158/85, pulse 75, respiratory rate 16, O2 sat 100% on room air.  GENERAL:  The patient was examined by Dr. Reynold Bowen after she received a  total of 6 mg of Dilaudid over eight hours in the emergency  department.  The patient was found in no acute distress.  She was sleeping soundly.  HEENT:  Eyes were unremarkable.  She had poor dentition.  NECK:  Supple without lymphadenopathy or thyromegaly.  LUNGS:  Clear to auscultation bilaterally with good air movement.  HEART:  She had regular rate and rhythm without murmurs, rubs or  gallops.  ABDOMEN:  Soft, completing nontender, nondistended.  No  hepatosplenomegaly or palpable masses.  EXTREMITIES:  No edema, cyanosis or clubbing noted.  Elbows and knees  had no decreased range of motion both in active and passive.  She had no  erythema, warmth or effusion.  No tenderness to palpation.  NEUROLOGIC:  Nonfocal.  Strength and sensory to light touch were intact.  The patient was appropriate in terms of her mental exam.  She did not  give her physician the impression that she was a narcotic seeker or  malingering.   LABORATORY DATA:  Admission labs included sodium 140, potassium 4.3,  chloride 108, bicarb 25, BUN 10, creatinine 0.81, glucose 120, WBC 5.9,  ANC 2.9, hemoglobin 13, RDW 14.8, MCV 83, platelets 359,000, bilirubin  0.6, alk phos 72, AST 22, ALT 18, protein 7.6, albumin 3.9, calcium 9.4,  lipase 29.  Urinalysis normal.   HOSPITAL COURSE:  1. CHRONIC PAIN.  I was able to review [name]'s clinic chart as      well as her past hospitalization reports.  It seems that over the      past 5-10 years she has been experiencing episodes of pain that      will involve her joints, abdomen or chest in an irregular pattern.      She has had repeated workups consisting of x-rays, multiple CT      scans and lab work for a sedimentation rate and autoantibodies.      However, none of her workup was revealing.  Given that the      patient's pain had much improved when I saw her, I offered her to      go home and follow up as an outpatient.  However, she requested to     stay  hospitalized overnight because she lives by herself and did       not feel comfortable driving home.  Given her completely benign      physical exam and basic lab work without any abnormalities, I did      not feel compelled to order any imaging studies.  However, given      the recurrence of her pain, we ordered a rheumatoid factor as well      as an ANA.  Her rheumatoid factor came back below 20, and the ANA      was pending at the time of discharge.  I later saw that day that      this had been previously been ordered twice as an outpatient and      had both times been negative.   At one point, it became obvious that perhaps the patient's history of  sickle cell trait with beta thalassemia could be related to her pain  crises.  I reviewed her outpatient chart and found that she had not had  a hemoglobin electrophoresis since she had moved to the [facility]  area.  I thus ordered a hemoglobin electrophoresis of which the results  will be followed up on by both me and her hematologist.   Other diagnoses that were considered included inflammatory bowel disease  which would be surprising given the absence of any diarrhea or bleeding.  Celiac disease remained a possibility, so anti-endomysial and anti-  transglutaminase antibodies were ordered and will be followed up on by  myself.   1. TYPE II DIABETES MELLITUS.  The patient's metformin was held on      admission.  She was placed on insulin sliding scale.  The patient      had one hypoglycemic episode on the evening of admission.  Her CBG      was found to be 37.  This was explained by the fact that the      patient had taken her Metformin that day without keeping adequate      p.o. intake.  A half an hour later after receiving orange juice,      her CBG was 130.   1. HYPERTENSION.  The patient was continued on her outpatient regimen      of hydrochlorothiazide and lisinopril with adequate response.   DISCHARGE VITAL SIGNS: temperature 98.1, heart rate 57, blood pressure  92/56, respiratory  rate 18 and O2 sat 96% on room air.  Of note,  orthostatic vital signs were checked, and the patient was not  orthostatic.   No further lab work was done while the patient was hospitalized.      Olene Craven, M.D.  Electronically Signed     MC/MEDQ  D:  10/03/2006  T:  10/04/2006  Job:  [id number]

## 2010-09-14 NOTE — Discharge Summary (Signed)
[name], [name] NO.:  [id number]   MEDICAL RECORD NO.:  [id number]          PATIENT TYPE:  INP   LOCATION:  5511                         FACILITY:  [facility]   PHYSICIAN:  Mick Sell, MD DATE OF BIRTH:  Sep 05, 1955   DATE OF ADMISSION:  12/11/2007  DATE OF DISCHARGE:  12/15/2007                               DISCHARGE SUMMARY   DISCHARGE DIAGNOSES:  1. Complex migraine headache.  2. Left-sided numbness.  3. Mild diarrhea  Chronic:  1  Hypertension.  1. Diabetes mellitus type 2.  2. The patient reported cerebrovascular accident in 1993.  3. Gastroesophageal reflux disease.  4. Hx/o Pancreatitis.  5. Hyperlipidemia.  6. Hx/o Seizure disorder.  7. History of pulmonary embolism.  8. Sickle cell trait.   DISCHARGE MEDICATIONS:  1. Darvocet 650/100 q.8 h. p.r.n. for pain.  2. P.o. Pravachol 20 mg daily.  3. P.o. metformin 500 mg b.i.d.  4. P.o. Phenergan 12.5 mg q.6 h. as needed for nausea.   CONDITION AT DISCHARGE:  Stable.   FOLLOWUP LABS:  BMET.  The patient is to be scheduled in our Outpatient  Clinic.  Please followup on new onset diarrhea on day of discharge.   PROCEDURES:  The patient had a CT of the head on December 11, 2007, with  and without contrast that showed prominent cerebellar atrophy for this  patients' age, unchanged.  No acute intracranial findings.  The patient  had an MRI and MRA of the brain with and without contrast that showed no  acute intracranial abnormality and chronic advanced generalized  cerebellar atrophy, nonspecific.  The MRA showed no acute abnormalities.   CONSULTATIONS:  Michael L. Thad Ranger, MD   ADMISSION HISTORY AND PHYSICAL:  The patient is a 55 year old female  with history of migraine headaches, CVA in 1993 with left-sided  weakness, hypertension, hyperlipidemia, diabetes mellitus type 2 with  last hemoglobin of 7.0 in April 2009, who presents with 1-1/2-day  history of constant pulsating 10/10 headache  in right parietal region  with associated dizziness and vertigo, photophobia,  phonophobia, and  aura including visual and olfactory.  The patient has history of  migraine since age 45 with only 1 prior during this year.  The patient  has never had migraines this bad and does not normally have associated  dizziness or auras.  The patient was seen in Urgent Care the night prior  to admission and given Toradol and Zofran with no relief and came to the  Outpatient Clinic on the morning of December 11, 2007.  The patient does  not take any prophylaxis for migraines.   On physical exam, she was afebrile at 97.8, her seated blood pressure  was 91/69 with a pulse of 86.  Her standing blood pressure was 105/86  with a pulse of 83.  The patient was in significant distress.  The  patient had significant photophobia.  Her pupils were equal, round, and  reactive to light, could not assess extraocular muscles secondary to  photophobia.  Her respiratory exam was clear to auscultation bilaterally  with good air movement.  Her cardiovascular exam had  a regular rate and  rhythm with no murmurs, rubs, or gallops.  Her GI exam was soft,  nontender, and nondistended with positive bowel sounds.  Her extremities  showed no clubbing, cyanosis, or edema.  Her GU exam showed no  costovertebral angle tenderness.  Her neurologic exam was the following:  Alert and oriented x3.  Cranial nerves II through XII grossly intact  except for a weak shoulder shrug on the left.  Left upper extremity  strength was 4/5, left lower extremity strength was 4/5 with decreased  sensation in her distal left lower extremity.  She had 3/4 reflexes in  her left patella.  Her right upper extremity and lower extremity  strength were 5/5 and her distal sensation was intact.  Her right  patellar reflex was 2/4.  Her cerebellar function was intact bilaterally  with point-to-nose assessment.   Her initial BMET had a sodium of 140, potassium  of 4.2, chloride of 104,  a bicarbonate of 23, a BUN of 25, a creatinine of 1.15, and a glucose of  114.  Her initial CBC had a white blood cell count of 6.0, hemoglobin of  10.8, hematocrit of 32.0, an MCV of 84.3, RDW of 13.1, and a platelet  count of 287.   HOSPITAL COURSE:  1. Migraine headache:  On admission, the differential for her headache      included complex migraine versus CVA versus less likely tumor,      tension headache, and cluster headache.  The CT was negative, but      we examined her further for CVA with an MRI/MRA.  The results of      the MRI/MRA are above and showed no acute abnormality.  She also      had a carotid Doppler, which showed mild mixed plaque throughout      bilaterally and a 40%-60% ICA stenosis noted bilaterally and      vertebral artery flow was antegrade bilaterally.  We also      controlled the patient's pain initially with Toradol, IV morphine,      and Tylenol.  Neurology was consulted the next morning.  Dr.      Thad Ranger assessed the patient and given the negative MRI/MRA      believed that her symptoms of headache and left-sided numbness and      weakness were most likely secondary to a complex migraine headache.      She was started on a migraine cocktail that included the      concomitant administration of IV Decadron 4 mg, IV Reglan 10 mg,      and IV Depakote 500 mg that was to be administered q.8 hours.  With      this cocktail, the patient began to experience mild symptomatic      relief.  She was continued on this along with p.r.n.      oxycodone/Tylenol for the next 2 days.  Her symptoms of left-sided      weakness improved by the second day of admission and her symptom of      severe right-sided headache improved gradually over the course of      the hospitalization.  On discharge, she still had a residual      headache but felt well enough to be discharged on an outpatient      regimen of Darvocet 650/100, which she had taken  previously to      admission.  The patient has infrequent migraines, but  they are very      severe.  At this time, she is not on any migraine prophylaxis, but      it may be prudent to put her on a calcium channel blocker or beta-      blocker.  Given her history of hypertension, she was not a      candidate for a triptan or ergotamine.  2. Left-sided weakness:  There was nothing to explain the new onset      left-sided weakness by MRI/MRA.  It was most likely secondary to      the migraine headache and resolved on the second day of admission.      There was no evidence of ischemia or a TIA to explain her symptoms.      We also performed a B12 which came back as 583, a TSH which came      back as 0.522, a RPR which was nonreactive, and a HIV which was      nonreactive.  3. Hypertension:  The patient came in with initial systolic blood      pressure in the 90-100 range and, so her home hydrochlorothiazide      and lisinopril were held.  She was kept off of these 2 medications      for the duration of her hospitalization and we recommended to her      not to continue these medications as an outpatient until she was      seen in our Outpatient Clinic.  She was asked to check her blood      pressure in a week and report back to [name] if the systolic blood      pressure was greater than 150.  Otherwise, we will see her in our      Outpatient Clinic and reassess her blood pressure.  4. Diabetes mellitus type 2.  The patient's metformin was held and she      was put on sliding-scale insulin, NovoLog with q.a.c. and nightly      CBGs.  5. Her blood sugars were under fair control on this regimen ranging      from 161/286, and she received 1-3 units on the sliding scale.  She      was restarted on her metformin upon discharge.  6. History of pulmonary embolus/sickle cell trait:  We have no      documentation of a PE, but given her multiple risk factors and her      reported PE, she was started on  Lovenox and SCDs upon admission.  7. Hyperlipidemia:  She was continued on her home dose of Pravachol 20      mg p.o. daily for the duration of the admission.  A fasting lipid      panel was performed with the following result.  Her total      cholesterol was 192, her triglyceride level was 63, her HDL was 61,      and her LDL was 118.  8. Diarrhea:  On the final day of admission, she reported for somewhat      loose bowel movements.  She reported no blood or mucous in her      stool.  Given that she had not had bowel movements the 2 days prior      and was on a low p.o. intake and then began a more significant p.o.      intake and had her narcotics reduced, it was thought that her  diarrhea was most likely secondary to the discontinuation of      narcotics and the resumption of p.o. intake.  She was not in any      acute distress.  It was recommended to her to ensure that she took      plenty of fluids and did not become dehydrated.  She was also told      that if her diarrhea continued or significantly worsened, she      should return back to the ED or call our Outpatient Clinic for      further workup.  She understood this and acknowledged that she      would follow through if her symptoms worsened or persisted.   DISCHARGE LABS AND VITALS:  Her discharge BMET on December 15, 2007, was  sodium of 140, potassium of 4.3, chloride of 108, bicarbonate of 22, a  glucose of 238, a BUN of 19, and a creatinine of 0.92.  Her discharge  CBC was white blood cell count of 10.5, hemoglobin of 11.2, hematocrit  of 34.1, MCV of 85.1, and a platelet count of 310.  Her discharge vitals  were temperature of 99.7, pulse of 68, respiration rate of 20, systolic  blood pressure of 110, diastolic of 78, and sating at 914% on room air.   PENDING LABS:  There are no pending labs at this time.      Linward Foster, MD  Electronically Signed      Mick Sell, MD  Electronically Signed     LW/MEDQ  D:  12/15/2007  T:  12/16/2007  Job:  [id number]   cc:   Outpatient Clinic  Casimiro Needle L. Thad Ranger, M.D.

## 2010-09-16 ENCOUNTER — Other Ambulatory Visit: Payer: Self-pay | Admitting: Internal Medicine

## 2010-09-17 NOTE — Consult Note (Signed)
NAMEDEISI, SALONGA NO.:  [id number]   MEDICAL RECORD NO.:  [id number]          PATIENT TYPE:  INP   LOCATION:  3712                         FACILITY:  [facility]   PHYSICIAN:  Charlton Haws, M.D.     DATE OF BIRTH:  [birth year]   DATE OF CONSULTATION:  12/13/2005  DATE OF DISCHARGE:                                   CONSULTATION   HISTORY:  [name] is a 55 year old patient with apparent lupus  anticoagulant.   She has a history of PE about a year ago.  She also has sickle cell trait.  She, apparently, has had multiple studies since 2002, and a V/Q scan on May  2006, showed a PE.   She was admitted to the hospital on December 12, 2005, by teaching service  with chest pain from front to back.   The pain was described as pressure that radiated to the left shoulder.   She also has a choking sensation.  The sensation is similar but nearly as  bad as the episode she had with pulmonary embolus.  At that time, she also  had significant coughing.  This time, there was no associated shortness of  breath or diaphoresis.  Pain was not helped by antacids.   She has multiple coronary risk factors including diabetes, hypertension, and  hypercholesterolemia.   She has had multiple negative GI workups in the past.   She does have symptoms of GERD, however.   She also has been having some right upper quadrant pain a few days prior to  admission and this morning has had some nausea and vomiting.   The patient has had multiple CT scans.   She had a cardiac workup in 1999, with negative Myoview.   Apaprently, there was an echo done in March 2007, which showed an EF of 50%  to 55%.   She is currently pain free.  Her enzymes are negative and her EKG is normal  with LVH.  The patient denies drug use and smoking.  She is currently living  with her son but plans on getting an apartment.   FAMILY HISTORY:  Positive for diabetes and cardiac disease.   PAST MEDICAL HISTORY:   Remarkable for atrophic vaginitis, sickle cell trait,  seizure disorder, pelvic floor instability, GERD, chronic abdominal pain.   ALLERGIES:  SULFUR, QUESTION OF FIORINAL, QUESTION OF ASPIRIN.   PHYSICAL EXAMINATION:  GENERAL:  She is on no distress.  VITAL SIGNS:  Blood pressure 150/88, pulse 84 and regular.  LUNGS:  Clear.  Carotids are normal.  HEART:  Normal S1 and S2.  Normal heart tones.  ABDOMEN:  Benign.  There is no right upper quadrant pain.  EXTREMITIES:  Distal pulses intact.  No edema.   IMPRESSION:  I will have to review the patient's previous Myoviews.  However, her chest pain is highly unlikely to be cardiac.  It is atypical.  It is nonexertional.  She has some chronic pain syndromes involving her  abdomen and chest.  Even with 9/10 chest pain, she has had no EKG changes  and negative enzymes.  From a cardiac perspective, I think it is reasonable to recheck her 2-D  echocardiogram and perform an adenosine Myoview in the morning.   Despite the patient's multiple risk factors, the clinical presentation does  not fit with unstable angina.   The patient also had stopped taking her Coumadin two weeks prior to  admission.  She is supposed to be on it for a history of PE and lupus  anticoagulant.   Although she has had multiple V/Q scans and CTs in the past, it may be  worthwhile to repeat a chest CT since she was not anticoagulated prior to  admission.           ______________________________  Charlton Haws, M.D.     PN/MEDQ  D:  12/13/2005  T:  12/13/2005  Job:  [id number]

## 2010-09-17 NOTE — Discharge Summary (Signed)
NAME:  [name], [name]                       ACCOUNT NO.:  [id number]   MEDICAL RECORD NO.:  [id number]                   PATIENT TYPE:  INP   LOCATION:  3732                                 FACILITY:  [facility]   PHYSICIAN:  Madolyn Frieze. Jens Som, M.D. [facility]         DATE OF BIRTH:  08-Mar-1956   DATE OF ADMISSION:  01/06/2002  DATE OF DISCHARGE:  01/07/2002                                 DISCHARGE SUMMARY   DISCHARGE DIAGNOSES:  1. Non-cardiac chest pain, likely musculoskeletal in origin.  2. Seizure disorder, treated with Dilantin.  3. Type 2 diabetes mellitus, diet-controlled, diagnosed in 1994.  4. Chronic joint pain.  5. History of migraine headaches.  6. History of beta thalassemia.  7. Status post total abdominal hysterectomy and bilateral salpingo-     oophorectomy.  8. Status post bilateral lumpectomy.  9. Status post acromioclavicular ligament tear in 1998.  10.      Question history of cerebrovascular accident in 1993.   DISCHARGE MEDICATIONS:  1. Dilantin 200 mg p.o. q.h.s.  2. Aspirin 81 mg p.o. q.d.  3. Tussionex suspension 5 mL q.12h. p.r.n. cough x14 days.   HISTORY OF PRESENT ILLNESS:  The patient is a 55 year old female with  diabetes mellitus, tobacco use, history of a CVA, and seizure disorder, who  presented with a one-day history of chest pressure.  The patient was in her  usual state of good health until Saturday, January 05, 2002, when she got a  cough and felt that she was coming down with the flu or other type of virus.  She spent most of the day in bed, and on Sunday around 3 p.m. she developed  chest pain described as though someone was standing on her chest and making  her short of breath.  She denied nausea, vomiting, diaphoresis, or a  pleuritic component to the pain.  She was taking Tylenol and got no relief  from that but was brought into the emergency room.  Her pain was finally  relieved with morphine 4 mg IV.  She described the pain as in her  chest,  under her arm, and the back of her left arm with some tingling in her  fingers.  She had no other complaints.   PHYSICAL EXAMINATION:  VITAL SIGNS:  Pulse 73, left arm blood pressure  160/90, right arm blood pressure 150/80, temperature 98.6 degrees,  saturation 99% on room air.  NEUROLOGIC:  No deficits.  CARDIOVASCULAR:  A regular rate and rhythm, no murmurs, rubs, or gallops.  LUNGS:  Clear to auscultation bilaterally.  No wheezes.  BREASTS:  No discharge, induration, masses, no axillary lymphadenopathy  appreciated.  The patient is tender to palpation over the left pectoralis  major and in the left axillary region.   LABORATORY DATA:  Admission chest x-ray showed no acute disease.  ELECTROCARDIOGRAM:  Showed early repolarization, regular axis, bradycardic,  otherwise normal.  Admission labs were significant for a potassium  of 3.6.    HOSPITAL COURSE:  1. CHEST PAIN:  The patient ruled out for a myocardial infarction.  Set of     enzymes:  115,1.8, 0.02.  It was believed that the patient's chest pain     was largely musculoskeletal in origin.  She has no neuromuscular deficits     on the left side, but there is a history of chronic joint pain.  In the     outpatient setting the patient evidently needs a mammogram, given that     her last one was four to five years ago, and she is status post     lumpectomy.  This has been arranged for Wednesday, January 09, 2002.     The pain may also be related to her extensive coughing.  The patient has     been prescribed Tussionex 5 mL q.12h. to help ease the cough.  All other     workup will be done in the outpatient setting.   1. SEIZURE DISORDER:  The patient's Dilantin level was sub therapeutic.  She     was restarted on 200 mg p.o. q.h.s.  The patient often has trouble     staying on the medication because of financial reasons.  Social Work was     consulted to help the patient get on a  medical assistance program.  This      will be attended to in followup in the outpatient setting.   1. DIABETES MELLITUS TYPE 2, WELL-CONTROLLED WITH DIET:  The patient will     need to receive regular diabetic care at the [facility]. [facility].   1. QUESTIONABLE HISTORY OF A CEREBROVASCULAR ACCIDENT IN 1993:  The patient     states that she had her CVA at [facility] in [place],     [place].  I believe it would be very important to get the initial     records of this hospitalization, for if the patient had a stroke due to a     hypercoagulable state, it would be more concerning due to the fact that     the chest pain might be related in some way to pulmonary embolism.  Of     note, the patient has maintained O2 saturations in the high 90s     throughout her admission, and an ABG on room air showed a pO2 of 85.4.   1. COUGH, LIKELY SECONDARY TO SOME SORT OF VIRAL PNEUMONIA:  For treatment,     the patient has been started on Tussionex 5 mL q.12h.   FOLLOW UP:  The patient is to follow up with Dr. Dante Gang at the  [facility]. [facility] on January 21, 2002, at  11:30 a.m.   DISCHARGE LABORATORY DATA:  Significant for a potassium of 4.4, lipase  pending.  As mentioned before negative cardiac enzymes.                                               Madolyn Frieze Jens Som, M.D. [facility]    BSC/MEDQ  D:  01/07/2002  T:  01/08/2002  Job:  [id number]   cc:   Dante Gang, M.D.

## 2010-09-17 NOTE — Discharge Summary (Signed)
[place]. [facility]  Patient:    [name], [name] Visit Number: [id number] MRN: [id number]          Service Type: MED Location: [phone] Attending Physician:  Phifer, Trinna Post Dictated by:   Ladell Pier, M.D. Admit Date:  04/02/2001 Discharge Date: 04/05/2001                             Discharge Summary  INCOMPLETE  ATTENDING NOT GIVEN  DISCHARGE DIAGNOSES: 1. Chest pain.  In 1999, she had a Cardiolite stress test done that showed an    ejection fraction of 47%, otherwise negative. 2. History of abdominal pain, admitted April 25 to August 16, 2000.  CT of the    abdomen was negative except for a mildly prominent intrahepatic biliary    duct and pancreatic duct.  Abdominal ultrasound negative. 3. History of pancreatitis in 1995. 4. Gastroesophageal reflux disease. 5. A left cerebrovascular accident in 1991.  Left leg residual weakness plus    seizure disorder. Dictated by:   Ladell Pier, M.D. Attending Physician:  Phifer, Trinna Post DD:  04/21/01 TD:  04/23/01 Job: [id number]
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
## 2010-09-17 NOTE — Consult Note (Signed)
NAMEKIMBERLEIGH, Munoz NO.:  [id number]   MEDICAL RECORD NO.:  [id number]          PATIENT TYPE:  INP   LOCATION:  3733                         FACILITY:  [facility]   PHYSICIAN:  Charlynne Pander, D.D.S.DATE OF BIRTH:  1955-10-18   DATE OF CONSULTATION:  09/23/2004  DATE OF DISCHARGE:                                   CONSULTATION   HISTORY OF PRESENT ILLNESS:  [name] is a 55 year old African-  American female referred by Dr. Lina Sayre for a dental consultation.  Patient with recent diagnosis of pulmonary embolism, and is currently on  anticoagulation with Lovenox until Coumadin becomes therapeutic.  The  patient had been experiencing toothache symptoms, and a dental consultation  was requested to evaluate the patient's poor dentition.   MEDICAL HISTORY:  1.  Pulmonary embolism and current anticoagulant therapies.  2.  History of seizure disorder.  3.  Hypertension.  4.  History of GERD.  5.  Diabetes mellitus - type 2.  6.  History of atrophic vaginitis.  7.  Anxiety disorder.  8.  History of acute pancreatitis.  9.  Hyperlipidemia.  10. History of migraine headaches, on Inderal prophylaxis.  11. Status post total abdominal hysterectomy with bilateral salpingo-      oophorectomy for endometriosis.  12. Status post bilateral lumpectomies with bilateral implant placements.   ALLERGIES/ADVERSE DRUG REACTIONS:  1.  SULFA.  2.  FIORICET/FIORINAL.   MEDICATIONS:  1.  Lovenox 60 mg q.12 h.  2.  Protonix 40 mg daily.  3.  Metformin 500 mg b.i.d.  4.  NovoLog insulin per sliding scale.  5.  Inderal 40 mg t.i.d.  6.  Ibuprofen 600 mg q.6 h.  7.  Clindamycin 300 mg q.6 h.  8.  Vicodin 5/500 mg 1-2 tablets q.4 h. as needed for pain.  9.  Coumadin to maintain therapeutic indices.   SOCIAL HISTORY:  The patient is single.  The patient works at [employer] in [place].  Patient with a history of smoking one half pack per day x 15  years.  The patient  quit smoking approximately six years ago.  The patient  denies the use of alcohol or IV drugs.   FAMILY HISTORY:  Father is 24 and healthy.  Mother is healthy.   FUNCTIONAL ASSESSMENT:  The patient was independently for ADLs prior to this  admission.   REVIEW OF SYSTEMS:  This was reviewed from the chart and health history  assessment form - this admission.   DENTAL HISTORY:   CHIEF COMPLAINT:  Dental consultation requested to evaluate poor dentition.   HISTORY OF PRESENT ILLNESS:  The patient gives a history of upper left  quadrant toothache for a couple days.  The patient had gone to the urgent  care center for the toothache and was given antibiotic therapy.  The patient  indicates she was given penicillin 500 mg q.i.d.  The patient subsequently  experienced shortness of breath which was evaluated again by urgent care and  then led to this hospital admission.  The patient was then subsequently  identified to have a pulmonary  embolism.   The patient at this time complains of 0/10 intensity pain.  The patient  indicates that the upper left root segment did hurt at a 10/10 intensity  several days ago.  The patient indicates that she last saw a dentist  approximately one year ago in [place], [place] to have a tooth  pulled.  The patient denies complications from that dental extraction.   DENTAL EXAMINATION:  GENERAL:  The patient is a well-developed, well-  nourished female, in no acute distress.  VITAL SIGNS:  Blood pressure 127/80; pulse 69; temperature 97.5;  respirations 16.  HEAD/NECK:  There is no palpable lymphadenopathy.  The patient acute TMJ  symptoms.  INTRAORAL:  The patient has normal saliva.  The patient has a palatal torus  which is near the posterior palatal seal area.  There is no evidence of  intraoral abscess formation.  DENTITION:  Patient with multiple missing teeth.  Patient with multiple  retained root segments.  PERIODONTAL:  Patient with  chronic periodontitis, applying calculus  accumulations, gingival recession, tooth mobility, and moderate to severe  horizontal and vertical bone loss.  DENTAL CARIES:  There are multiple teeth with dental caries.  ENDODONTIC:  Patient with a history of acute irreversible pulpitis symptoms  associated with tooth #13 which is a root segment.  The patient also has  multiple areas of periapical pathology including tooth #22 and 28 and  possibly #17.  The patient has had previous root canal therapies.  CROWN OR BRIDGE:  There are no crown or bridge restorations.  PROSTHODONTIC:  The patient denies the presence of dentures.  OCCLUSION:  Patient with a poor occlusal scheme secondary to multiple  missing teeth, multiple retained root segments, superior rupture and  drifting of the unopposed teeth into the edentulous areas, and lack of  replacement of missing teeth with dental restorations.   RADIOGRAPHIC INTERPRETATION:  A panoramic x-ray was taken on Sep 22, 2004.   There are multiple missing teeth.  There are multiple retained root  segments.  There are multiple areas of periapical pathology.  There is  superior rupture and drifting of the unopposed teeth into the edentulous  areas.  There are dental caries noted.  There is malocclusion noted.   ASSESSMENT:  1.  History of acute pulpitis symptoms associated with tooth #13.  This is a      routine segment.  2.  Multiple areas of periapical pathology.  3.  Multiple missing teeth.  4.  Multiple retained root segments.  5.  Chronic periodontitis with bone loss.  6.  Areas of gingival recession.  7.  Plaque and calculus accumulations.  8.  Tooth mobility.  9.  Multiple dental caries.  10. Lack of replacement of missing teeth with dental restorations.  11. Superior rupture and drifting of the unopposed teeth into the edentulous      areas.  12. Poor occlusal scheme.  13. History of oral neglect. 14. Current anticoagulant therapy, with the  risk for significant bleeding      with invasive dental procedures.  15. Questionable need for antibiotic premedication prior to invasive dental      procedures.   PLAN/RECOMMENDATIONS:  1.  I discussed the risks, benefits, and complications of various treatments      options with the patient in relationship to her medical and dental      conditions, current anticoagulant therapy, and pulmonary embolism.  We      discussed various treatment options to include  no treatment, multiple      extractions with alveoloplasty, preprosthetic surgery as indicated,      periodontal therapy, dental restorations, root canal therapy, implant      therapy, and replacement of missing teeth as indicated.  Due to the      patient's current anticoagulant therapy and the pulmonary embolism      diagnosis, the anticoagulant therapy will most likely not be able to be      stopped at this time.  We will need to find a time in the future when it      is safe to withdraw the anticoagulant therapy to allow for multiple      extractions and alveoloplasty as indicated.  In the meantime, suggest      continuation of clindamycin for an additional five to seven days.      Hopefully, the patient will remain asymptomatic.  If the patient does      become symptomatic, will need to reconsult dental medicine and consider      selective extraction as indicated or referral to an oral surgeon as      indicated.  2.  Discussion of findings with Dr. Jearl Klinefelter and Dr. Lina Sayre as      indicated.      RFK/MEDQ  D:  09/23/2004  T:  09/24/2004  Job:  [id number]   cc:   Trudee Kuster, M.D.   Fransisco Hertz, M.D.  [address]  [place] [address]  Fax: [phone]   Charlynne Pander, D.D.S.  [facility]  [address]  [place]  [place] [address]  Fax: [phone]

## 2010-09-17 NOTE — Discharge Summary (Signed)
[place]. [facility]  Patient:    [name], [name]                    MRN: [id number] Adm. Date:  [id number] Disc. Date: [id number] Attending:  Madaline Guthrie Dictator:   Duncan Dull, M.D. CC:         Tawni Millers, M.D.   Discharge Summary  DISCHARGE DIAGNOSES: 1. Abdominal pain, unknown etiology. 2. Seizure disorder. 3. Diabetes mellitus type 2, diet controlled. 4. Gastroesophageal reflux disease. 5. Sickle cell trait. 6. Beta-thalassemia.  DISCHARGE MEDICATIONS: 1. Pepcid 20 mg one p.o. b.i.d. 2. Dilantin 100 mg two p.o. q.h.s. 3. Darvocet-N 100 two p.o. q.4-6h. p.r.n. pain. 4. Phenergan 12.5 mg one to two q.6h. p.r.n. nausea.  FOLLOWUP:  Follow up with her primary care doctor, Dr. Verdis Frederickson, in the internal medicine clinic within the next 10 days.  PROCEDURE:  Computed tomography of the chest, pelvis and abdomen on April 25. Impression of mildly prominent intrahepatic biliary duct and pancreatic duct. No evidence for pancreatic lesion.  Status post hysterectomy.  Surgically absent appendix.  No evidence for adnexal mass, free fluid or adenopathy.  No acute abnormality of the pelvis.  Mediastinal silhouette is within normal limits.  Lungs are free of local consolidation and no events for acute abnormality.  HISTORY OF PRESENT ILLNESS:  [name] is a 55 year old, African-American female with history of sickle cell trait and beta-thalassemia who presented to the [facility] for ER followup where she was seen on April 24, for right-sided abdominal pain.  At that time, she had a negative, non-contrasted abdominal pelvic CT and a negative abdominal ultrasound.  She reported nausea without vomiting, anorexia and crampy abdominal pain with abnormal stools for the last several days.  She denied fevers, but had chills and night sweats.  She denied dysuria, hematochezia or melena.  She denied shortness of breath and pleuritic chest pain.  PHYSICAL  EXAMINATION:  VITAL SIGNS:  Afebrile with pulse of 54, blood pressure 121/71, weight 103.2.  GENERAL:  She was a thin, African-American female in mild distress complaining of abdominal pain.  LUNGS:  Clear with good air movement.  CARDIOVASCULAR:  Regular rate and rhythm without murmurs.  ABDOMEN:  Positive for right lower quadrant tenderness with guarding, but no rebound.  Positive bowel sounds and no evidence of hepatosplenomegaly.  LABORATORY DATA AND X-RAY FINDINGS:  Dilantin level was subtherapeutic at less than 2.5.  Lipase was 61.  Total bilirubin 0.6, Alk phos 72, AST 20, ALT 20, total protein 7.2.  Sodium 142, potassium 3.6, chloride 106, bicarb 35, BUN 6, creatinine 0.7, glucose 87.  Hemoglobin 12.3, white blood count 7.3, platelets 264.  ASSESSMENT/PLAN:  Although the patient had had an extensive workup through the ER on the previous day, her pain was unresolved and she was admitted for continued work-up of her abdominal pain.  Additionally, she needed IV rehydration as she appeared dry by her initial labs.  HOSPITAL COURSE:  #1 - ABDOMINAL PAIN:  The patient was admitted for pain management, IV hydration and clinical observation.  Although her lipase was very minimally elevated on admission, it was normal on April 27, as well as the rest of her LFTs.  CT of the abdomen and pelvis were negative for acute disease.  Her rectal exam was heme negative.  She was treated empirically for gastritis with a proton pump inhibitor which was switched to Pepcid at the time of discharge.  H. pylori serology was drawn  prior to discharge and was negative.  The patients abdominal pain improved with clear liquid diet and she was without emesis.  She was able to tolerate solid foods by the afternoon of April 27, and was discharged to home with the above listed medications. She was instructed to follow up with her regular doctor, Dr. Verdis Frederickson, in the clinic within the next 7-10 days.  If she  has continued abdominal pain, it is recommended that she be referred to a gastroenterologist for colonoscopy.  Labs at the time of discharge showed sodium 139, potassium 3.4, chloride 108, bicarb 28, BUN less than 3, creatinine 0.8, glucose 107.  LFTs were normal. White blood count 5.0, hemoglobin 11.3, hematocrit 33.4, platelets 284.  #2 - SEIZURE DISORDER:  The patients Dilantin level was subtherapeutic at the time of admission.  She had no evidence of seizure activity.  She was given 200 mg b.i.d. on April 26, and then resumed her normal dose at 200 mg p.o. q.h.s. DD:  09/08/00 TD:  09/11/00 Job: [id number]

## 2010-09-17 NOTE — Discharge Summary (Signed)
[place]. [facility]  Patient:    [name], [name] Visit Number: [id number] MRN: [id number]          Service Type: EMS Location: [facility] Attending Physician:  Devoria Albe Dictated by:   Kern Reap, M.D. Admit Date:  05/10/2001 Discharge Date: 05/11/2001                             Discharge Summary  DISCHARGE DIAGNOSES: 1. Atypical chest pain, ruled out myocardial infarction, ruled out    pulmonary embolus, ruled out coronary artery disease. 2. Sickle cell trait. 3. Seizure disorder. 4. Diabetes mellitus, type 2, diet controlled. 5. Status post total hysterectomy.  No hormone replacement greater    than 19 years. 6. Status post bilateral lumpectomy for breast lesions, breast    secretions.  Follow-up from outpatient mammography.  DISCHARGE MEDICATIONS: 1. Tylenol 650 mg p.o. q.4h. p.r.n. for chest pain. 2. Pepcid 20 mg p.o. b.i.d. 3. Dilantin 300 mg p.o. q.d.  DISCHARGE DISPOSITION:  The patient is being discharged to home. The patient has continuing pain, ruled out life threatening etiology.  Will need further work-up as an outpatient by Trudee Kuster, M.D. in the outpatient clinic in approximately two weeks.  PROCEDURE: 1. Adenosine Cardiolite showing no MI.  No ischemia.  No defects.    Questionable lateral wall pooling consider possible    cardiomyopathy is a underlying etiology of an ejection fraction    of 47%. 2. EKG x 2, normal throughout and during pain.  CONSULTANTS:  Cardiology - Dr. Katrinka Blazing.  CHIEF COMPLAINT:  Substernal chest pain, sharp knife-like with radiation to the left arm.  HISTORY OF PRESENT ILLNESS:  [name] is a 55 year old African-American female with a history of sickle cell trait, diabetes diet controlled and seizure disorders, complaining of substernal chest pain starting this a.m.  The patient describes the pain as a sharp knife-like stabbing unrelenting since this morning.  The patient complains of  pain 10 out 10, worse pain every felt unrelated to exertion, inspiration or expiration, or change in position.  No associated shortness of breath.  She did have some diaphoresis and some radiation into her left arm.  The patient thought chest pain GI initially and started having numbness and tingling and near syncope with tachypnea after pain developed this morning.  EMS was called and nitroglycerin provided some relief from the pain, but most of the pain was relieved with morphine.  The patient states she does have a two days approximately one week ago of feeling drained with no associated chest pain.  She does state having pain related to her sickle cell trait and questionable thalassemia, but none this bad.  She denies any paroxysmal nocturnal dyspnea or orthopnea.  The patient currently on Dilantin 300 mg p.o. q.d. for seizures.  ALLERGIES: 1. ASPIRIN makes her nauseous. 2. SULFUR. 3. FIORINAL.  PAST MEDICAL HISTORY:  As above.  PAST SURGICAL HISTORY: 1. Status post lumpectomy in bilateral breasts. 2. Status post total bilateral hysterectomy and no hormonal    replacement of greater than 19 years.  SOCIAL HISTORY:  She is a half a pack per day smoker for greater than 20 years, discontinued about a month ago.  Positive ETOH only on weekends.  No IV drug use.  FAMILY HISTORY:  Significant for positive breast cancer, positive coronary artery disease, hypertension, and diabetes.  PHYSICAL EXAMINATION:  VITAL SIGNS:  She was afebrile.  Blood pressure 151/95, heart  rate 68, respirations 16, saturation 99% on O2.  GENERAL:  She is alert and oriented x 3 in mild distress. Supine in bed.  SKIN:  Warm and dry.  Color within normal limits.  HEENT:  Normocephalic, atraumatic.  TMs clear.  Extraocular movements intact.  No oral or nasopharyngeal erythema or edema or exudates.  Poor dentition.  NECK:  Supple.  Nontender.  No thyromegaly.  No adenopathy.  No bruits.  CHEST:   Bilateral equal expansion.  Nontender.  LUNGS:  Clear to auscultation bilaterally without wheezing, rales, or rhonchi.  No axillary adenopathy.  HEART:  S1 and S2 without murmurs, rubs or gallops.  Regular rate and rhythm on telemetry.  ABDOMEN:  Soft, positive bowel sounds.  Nontender. Nondistended.  No hepatosplenomegaly.  No CVA tenderness or rebound tenderness.  No guarding.  EXTREMITIES:  No edema, 2+ bilateral radial and DP pulses.  NEUROLOGIC EXAM:  Alert and oriented x 3.  Pupils equal, round and reactive to light.  Cranial nerves II-XII are intact. Strength 5/5 and normal sensation to touch in upper and lower extremities without any pain.  No bruits or JVD.  LABORATORY DATA:  All within normal limits except hemoglobin of 11.3 with an MCV of 76.  Chest x-ray showed no active disease.  EKG showed sinus bradycardia with good R wave progression.  No acute ischemia.  Dilantin level was within normal limits at 10.4.  CK-MB and troponin I were within normal limits.  HOSPITAL COURSE:  #1 - SUBSTERNAL CHEST PAIN:  Typical enough and risk factors of postmenopausal greater than 19 years without hormonal replacement therapy, diabetes, short duration in sickle cell trait.  The patient being ruled out for myocardial infarction.  All CK-MB and troponin I were within normal limits.  Concern for possible chest syndrome secondary to her sickle cell disease that she states, however, the patient does only have sickle cell trait and this is very unlikely as the source.  Pulmonary embolus was ruled out with given a normal ABG with no AA gradient.  The patient also received adenosine Cardiolite study after consulting cardiology to rule out any coronary artery disease.  She did have some mild decrease in her EF, but not suggestive of any etiology for her pain.  Morphine relieved her pain somewhat and nitroglycerin and heparin were discontinued after her CKs had ruled her out.  The  patient does  state a history of a prodromal flulike symptom approximately a week ago; however, no evidence of true pericarditis exist.  Chest pain remained basically stable throughout the hospital stay with some intermittent chest pain with mild exacerbations, relieved somewhat by Tylenol.  This will need work-up as an outpatient as life threatening causes were ruled out on this admission.  #2 - SICKLE CELL TRAIT:  Again, this was possible source for her substernal chest pain; however, given the history that this is only sickle cell trait and not sickle cell disease with thalassemia, acute chest syndrome would be very unlikely.  No evidence currently of active sickle process.  Hemoglobin was stable at 11.3.  #3 - SEIZURE DISEASE:  No seizures noted.  Dilantin within normal limits.  Continue current medications.  #4 - BREASTS SECRETIONS:  The patient has already scheduled a follow-up visit with Family Medicine to have a mammogram.  This could possibly be the source of the chest pain if a abscess or possibly even carcinoma is located.  #5 - DIABETES MELLITUS, TYPE 2:  CBGs throughout the hospital stay with some  mild elevation.  She is currently under diet control and trying to lose weight.  #6 - TOTAL HYSTERECTOMY:  She is currently on no hormonal replacement therapy.  This could be a risks factor for coronary artery disease given no hormonal replacement greater than 19 years.  However, Cardiolite pretty much ruled out coronary artery disease in this patient.  DISCHARGE LABORATORY DATA:  Significant for CK-MB and troponin I of 133, 0.8, less than 0.1; 130, 0.9, less than 0.1; 235, 0.5, less than 0.1 three CK-MB to rule out MI.  ABGs of 7.43, 40, 166, and 27 on two liters per minute; 7.39, 41, 98, and 26 on room air. White blood cell count 6.2, hemoglobin 11.3, hematocrit 33.2, platelets 334 with MCV of 76.  Sodium 145, potassium 3.8, chloride 109, bicarb 25, BUN 13, creatinine  0.5 and a glucose of 117. Dictated by:   Kern Reap, M.D. Attending Physician:  Devoria Albe DD:  11/28/97 TD:  11/29/97 Job: [id number]

## 2010-09-17 NOTE — Group Therapy Note (Signed)
   NAME:  [name], [name] NO.:  [id number]   MEDICAL RECORD NO.:  [id number]                   PATIENT TYPE:  OUT   LOCATION:  [facility]                           FACILITY:  [facility]   PHYSICIAN:  Argentina Donovan, MD                     DATE OF BIRTH:  1955-09-12   DATE OF SERVICE:  12/17/2002                                    CLINIC NOTE   SUMMARY:  The patient is a 55 year old black female who was sent by internal  medicine at [facility] because of low abdominal cramps and abdominal pain  that started approximately two weeks ago.  The patient has a history of  GERD, hypertension, anxiety, type 2 diabetes mellitus, sickle cell trait,  and beta thalassemia.  The patient states that she feels that she gets these  terrible abdominal cramps that would be so much better if she could pass gas  but nothing ever happens.  Today she had some uncontrollable diarrhea.  She  has been noticing abdominal swelling over the past couple of weeks that made  it more difficult to put on her slacks.  Significant past history is a total  abdominal hysterectomy and bilateral salpingo-oophorectomy for endometriosis  at the age of 47.  She was placed on Premarin which she was on for two  years.  She denies constipation.  Examination of the abdomen is soft,  markedly tender from the umbilicus down, with very little guarding and no  deep rebound, very high pitched bowel sounds with rushes.  Genitalia  external is normal. BUS somewhat atrophic.  The vagina is clean with loss of  rugae and completely atrophic.  Rectal exam shows no masses but a slight  rectocele.   IMPRESSION:  This patient probably has a partial intestinal obstruction,  small bowel; however, would rule out bowel inflammatory disease.  I will  order a CAT scan of the abdomen, upper GI series, and refer the patient to a  general surgeon.                                               Argentina Donovan, MD    PR/MEDQ  D:   12/17/2002  T:  12/17/2002  Job:  [id number]

## 2010-09-17 NOTE — Discharge Summary (Signed)
[place]  Patient:    [name], [name] Visit Number: [id number] MRN: [id number]          Service Type: MED Location: [phone] Attending Physician:  Phifer, Trinna Post Dictated by:   Ladell Pier, M.D. Admit Date:  04/02/2001 Discharge Date: 04/05/2001                             Discharge Summary  DISCHARGE DIAGNOSES: 1. Chest pain.    a. In 1999, Cardiolite was negative, ejection fraction of 47%. 2. History of abdominal pain.  CT of the abdomen negative except for mildly    prominent intrahepatic biliary duct and pancreatic duct.  Abdominal    ultrasound negative.  History of pancreatitis in 1995. 3. 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 4. Sickle cell trait/beta-thalassemia.    a. Followed by Dr. Donnetta Hail at [facility]. 5. Childhood asthma.  No recent exacerbations. 6. Diabetes type 2.  Diet controlled.  Started in 1994. 7. Breast biopsy benign status post bilateral breast implants. 8. History of hematuria.  SUBJECTIVE: 1. Urine dipstick negative for blood in October 2002. 2. History of multiple joint pains.  DISCHARGE MEDICATIONS: 1. Dilantin 100 mg b.i.d. 2. Percocet one to two q.6h. p.r.n. for pain.  FOLLOWUP APPOINTMENT:  Dr. Verdis Frederickson in outpatient clinic.  The patient to call with appointment.  CONSULTANTS:  None.  PROCEDURES:  None.  HISTORY OF PRESENT ILLNESS:  The patient is a 55 year old African-American female with past medical history significant for CVA in 1991, seizure disorder, beta-thalassemia sickle cell trait.  She presented to the emergency department with complaints of severe acute 15/10 chest pain that started at 1 p.m. on the day of presentation.  She stated that she was sitting at home reading when the chest pain came on and she stated the pain radiated around to her back, lasted for eight hours, she thought it was her reflux, took  several Gas-X and Tums with no relief.  She had some shortness of breath with the pain, some nausea but no vomiting later in the day.  She reported some chills but no diaphoresis.  She did report left arm numbness off and on today.  She denies any heart palpitations, cough, rhinorrhea, shakes, chills, or fevers. Her last BM was yesterday.  She also denies any syncope, headache, vision changes, polyuria, polydipsia, urgency, hematuria, or abdominal pain. Currently, she describes her chest pain as 10/10 but she denies nausea, vomiting, diaphoresis, numbness, or shortness of breath.  PAST MEDICAL HISTORY:  As stated in the discharge diagnoses.  MEDICATION:  Dilantin 100 mg p.o. b.i.d.  ALLERGIES:  SULFA (she gets a rash) and FIORINAL (she gets a drunk sensation) and FIORICET.  FAMILY HISTORY:  Grandfather had a blood disorder.  Four brothers - three died, one from a house fire, another leukemia, and another questionable unknown cause.  Three sisters - two died from a house fire.  Two sons. Grandfather is in good health.  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  She was born [place] was raised in [place].  She has two sons that are grown.  She smokes one pack per day for 25 years - quit two months ago.  No  alcohol.  No IV drug use.  PHYSICAL EXAMINATION:  VITAL SIGNS:  Temperature 99.1, blood pressure 135/84, pulse 80, respirations 20, pulse oximetry 98% on room air.  GENERAL:  She is an African-American female in no apparent distress.  HEENT:  Pupils equal, round, and reactive to light.  Extraocular motions intact.  Throat is clear; no exudate.  NECK:  No JVD, no lymphadenopathy.  HEART:  Regular rate and rhythm; no murmurs, rubs or gallops.  LUNGS:  Clear to auscultation bilaterally; no wheezes, rhonchi, or  rales.  ABDOMEN:  Soft, nontender, nondistended, positive bowel sounds; no organomegaly.  EXTREMITIES:  2+ DP pulse bilaterally; no clubbing, cyanosis, or edema.  NEUROLOGICAL:  Cranial nerves II-XII grossly intact.  Sensitivity intact. Good range of motion.  Motor 4/5 in right lower extremity otherwise 5/5 throughout.  HOSPITAL COURSE: #1 - CHEST PAIN:  The patient was admitted to a telemetry bed and cardiac enzymes were done x 3 and sums were all negative.  EKG showed no changes. D-dimer was within normal limits.  Spiral CT was negative.  The patient had a Cardiolite stress test done that showed no ischemic changes.  The patients chest pain is most likely, based on description, pleuritic and she ruled out for cardiac involvement of her chest pain.  The 2-D echo done on this admission showed an EF of 55-65%.  #2 - GASTROESOPHAGEAL REFLUX DISEASE:  She was continued on a proton pump inhibitor and H1 blocker and an H2 blocker was added.  The patient still complained that she is still having the chest pain.  #3 - DIABETES:  The patients glucose continued to be in the 180s.  The patient should follow up with her diabetes on an outpatient basis.  She may need low dose oral medications.  Her hemoglobin A1C on discharge was 5.9.  #4 - SEIZURE DISORDER:  The patient was continued on her home dose of Dilantin and she had no episode of seizure while she was in the hospital.  LABORATORIES ON DISCHARGE:  WBC 5.9, hemoglobin 11.2, hematocrit 32.5, MCV 80.5, platelets 275.  PT 12.7, INR 0.9, PTT 30.  D-dimer 0.22 which is within normal limits.  Sodium 139, potassium 3.5, chloride 109, CO2 25, glucose 186, BUN 9, creatinine 0.7, potassium 8.9.  Total protein 6.8, albumin 3.3, AST 24, ALT 15, alk phos 61, total bili 0.7.  Lipase 27.  Discharge last set of cardiac enzymes: CK of 112, MB 0.9, ______ index 0.8, troponin I less than 0.1.  Lipid profile: Total cholesterol of 188, triglycerides  80, HDL 80, LDL 92.  HIV test was negative/nonreactive.   Her Dilantin level was 1.7.  2-D echo: Overall LV systolic function was normal, left ventricular EF was 55-65%, no evidence of LV regional wall motion abnormalities, mild mitral regurgitation, left atrial fibrillation was at the upper sides of normal.  Chest x-ray shows no active disease.  Spiral CT: No PE, no evidence of DVT. Dictated by:   Ladell Pier, M.D. Attending Physician:  Phifer, Trinna Post DD:  04/21/01 TD:  04/23/01 Job: [id number]

## 2010-09-17 NOTE — Op Note (Signed)
NAMEDENEE, BOEDER NO.:  [id number]   MEDICAL RECORD NO.:  [id number]          PATIENT TYPE:  INP   LOCATION:  3021                         FACILITY:  [facility]   PHYSICIAN:  Danise Edge, M.D.   DATE OF BIRTH:  05/30/1955   DATE OF PROCEDURE:  02/27/2004  DATE OF DISCHARGE:                                 OPERATIVE REPORT   PROCEDURE:  Colonoscopy.   PROCEDURE INDICATION:  [name] is a 55 year old female born  1955/06/19.  [name] was admitted to [facility] to treat  suspected pancreatitis due to her magnetic resonance  cholangiopancreatography-diagnosed pancreas divisum.  This hospitalization  occurred last week.  She was readmitted to the hospital this week with  generalized abdominal cramps and uncontrollable, nonbloody diarrhea, which  started one day prior to hospitalization.  Her usual bowel movement pattern  is a formed bowel movement every three days.  She had a formed bowel  movement 72 hours prior to the onset of her nonbloody diarrhea.  She reports  no vomiting.   Her stool is negative for Shiga toxin, Clostridium difficile toxin, white  blood cells, Giardia, Cryptosporidium.  Her stool culture is negative for  enteric pathogens.  Her CT scan of the abdomen and pelvis is unremarkable.   MEDICATION ALLERGIES:  ASPIRIN, SULFA, FIORINAL.   CHRONIC MEDICATIONS:  Metformin, Vicodin, Phenergan.   PAST MEDICAL HISTORY:  1.  Type 2 diabetes mellitus.  2.  Sickle cell trait.  3.  TAH-BSO for endometriosis.  4.  Bilateral lumpectomy and bilateral breast implants.  5.  Seizure disorder.  6.  Beta thalassemia.  7.  Gastroesophageal reflux disease.  8.  Pancreas divisum by 2002 magnetic resonance pancreatocholangiography.  9.  Childhood asthma.  10. 1995 pancreatitis.  11. Migraine headaches.  12. Hypertension.  13. December 26, 2002, normal upper GI small bowel follow-through x-ray      series.   HABITS:  [name]  smokes cigarettes and consumes alcohol.   FAMILY HISTORY:  Noncontributory.   LABORATORY DATA:  CBC normal.  Complete metabolic profile normal except for  elevated glucose.  Amylase and lipase normal.  Urinalysis normal.  H. pylori  antibody negative.  Blood culture negative.  C. difficile toxin negative.  Fecal white blood cells negative.  Stool culture negative for enteric  pathogens.  Stool screen for Giardia and Cryptosporidium negative.  Stool  screen for Shiga toxin negative.   ENDOSCOPIST:  Danise Edge, M.D.   PREMEDICATION:  Versed 7.5 mg, fentanyl 75 mcg.   PROCEDURE:  Esophagogastroduodenoscopy.   After obtaining informed consent, [name] was placed in the left  lateral decubitus position.  I administered intravenous Demerol and  intravenous Versed to achieve conscious sedation for the procedure.  The  patient's blood pressure, oxygen saturation, and cardiac rhythm were  monitored throughout the procedure and documented in the medical record.   The Olympus gastroscope was passed through the posterior hypopharynx, down  the esophagus, and into the stomach without exam of the esophagus or  stomach.  There was a large amount of liquid and solid material  in the  stomach.  The pylorus was intubated.  Once the endoscope entered the  pylorus, the patient had an episode of vomiting with possible bronchospasm  and aspiration.  The gastroscope was removed.  The patient suctioned.  Her  oxygen saturation fell to 88% but rapidly returned to the 95-98% level.  Repeat esophagogastroduodenoscopy was not attempted with the patient's  stomach full of fluid.   ASSESSMENT:  Cancelled esophagogastroduodenoscopy.   PROCEDURE:  Proctocolonoscopy to the cecum.   Anal inspection and digital rectal exam were normal.  The Olympus adjustable  pediatric colonoscope was introduced into the rectum and advanced to the  cecum.  Colonic preparation for the exam today was excellent.   Rectum  normal.   Sigmoid colon and descending colon normal.   Splenic flexure normal.   Transverse colon normal.   Hepatic flexure normal.   Ascending colon normal.   Cecum and ileocecal valve normal.   ASSESSMENT:  Normal proctocolonoscopy to the cecum.   ASSESSMENT:  I have no explanation for [name] resolving acute  diarrhea and abdominal cramps.  I suspect her diarrhea is due to a resolving  viral gastroenteritis.   I will schedule [name] for a repeat upper GI small bowel follow-  through x-ray series to ensure no partial small bowel obstruction, which I  do not think is present.  As noted in the past medical history, her August  2004 upper GI small bowel follow-through x-ray series was normal.       MJ/MEDQ  D:  02/27/2004  T:  02/28/2004  Job:  [id number]

## 2010-09-17 NOTE — Discharge Summary (Signed)
[name], [name] NO.:  [id number]   MEDICAL RECORD NO.:  [id number]          PATIENT TYPE:  INP   LOCATION:  3733                         FACILITY:  [facility]   PHYSICIAN:  Fransisco Hertz, M.D.  DATE OF BIRTH:  Mar 14, 1956   DATE OF ADMISSION:  09/20/2004  DATE OF DISCHARGE:  09/24/2004                                 DISCHARGE SUMMARY   DISCHARGE DIAGNOSES:  1.  Pulmonary embolism with high probability be VQ scan.  2.  Diabetes.  3.  Dental abscess treated with clindamycin.  4.  Migraine headaches.  5.  History of seizure disorder.  6.  Hypertension.  7.  Sickle cell trait.  8.  Hyperlipidemia.  9.  Gastroesophageal reflux disease.  10. Status post hysterectomy with pelvic floor instability.  11. History of atypical chest pain with Cardiolite in 1999 showing no      ischemic changes and an ejection fraction of 50%.  12. Anxiety.  13. History of acute pancreatitis.   DISCHARGE MEDICATIONS:  1.  Coumadin 5 milligrams take as directed.  2.  Clindamycin 300 milligrams q.i.d.  3.  Propranolol 40 milligrams 1 p.o. t.i.d. for migraine prophylaxis.  4.  Vicodin with 1 every 6 hours p.r.n.  5.  Metformin 500 milligrams b.i.d.  6.  Ibuprofen 600 milligrams q.6 h p.r.n. x3 days.  7.  Prilosec 20 milligrams daily p.r.n.  8.  Afrin nasal spray for nosebleeds as needed.   FOLLOW-UP:  The patient will follow-up at the [facility]  for Coumadin check on Tuesday Sep 28, 2004 at 2:00 p.m. At that point, we  will need to go over her hypercoagulability profile to see if anything has  come back and needs to be worked up as she does not have any obvious risk  factors aside from her general medical conditions. She will need to have a  CBC checked at some point for routine follow-up with her Coumadin.   PROCEDURE PERFORMED:  Ventilation-perfusion scan performed on Sep 21, 2004  showing two perfusion defects in the right mid lung and left lateral base  consistent with high probability for pulmonary embolism.   ADMISSION HISTORY AND PHYSICAL:  The patient is a 55 year old black female  with a past medical history described above who went to Urgent Care for  toothache and given antibiotics recently. The patient awoke on the morning  of admission and had acute dyspnea and some chest discomfort and pain. The  patient describes a squeezing sensation and an inability take a deep breath.  The patient called Urgent Care and was told to return to clinic. When the  patient arrived, she was sent to the emergency department for further  evaluation. The patient denies any leg edema but did on further questioning  complain of some right side pain. Also did complain of some left upper  extremity pain several days prior to admission.   PHYSICAL EXAMINATION:  VITAL SIGNS:  Temperature 97.5, pulse 92, blood  pressure 131/89, respirations 12, O2 sat 99% on room air. GENERAL:  Moderate  size black female, alert and oriented x4 comfortable.  HEENT:  Shows pupils equal and reactive to light. Extraocular movements  intact. Oropharynx poor dentition, otherwise clear.  NECK:  Without JVD.  CHEST:  Chest is clear to auscultation bilaterally.  CARDIAC:  Shows regular rate and rhythm without murmur.  ABDOMEN:  Soft, nontender, nondistended with positive bowel sounds.  EXTREMITIES:  Showed no lower extremity edema. Negative Homans' sign.  LYMPH NODES:  Shows no cervical lymphadenopathy.   LABORATORY DATA:  EKG shows Q-waves in II, III, aVF and V5-V6.   INR 0.9. PT 12.1, PT 34. Sodium 137, potassium 3.8, chloride 103, CO2 28,  BUN 9, creatinine 0.8, glucose 140. White count 8.1, hemoglobin 12.7,  hematocrit 47, platelets 389,000.   HOSPITAL COURSE:  Problem 1:  PULMONARY EMBOLISM WITH HIGH PROBABILITY VQ  SCAN: The patient was admitted to telemetry and placed on treatment dose  Lovenox and Coumadin was began. She had a hypercoagulability profile that  was  drawn. Apparently, it was drawn after she had received Lovenox and her  first dose of Coumadin. Further workup did not show any obvious  abnormalities other than the VQ scan. At this point did not have a  definitive cause. It was discussed whether or not to get lower extremity  Doppler's. It was felt that she should be treated the same way. She will be  treated with Coumadin for at least six months and then this can be  reevaluated. She became a rapidly therapeutic on one dose of Coumadin and  her INR increased to be supratherapeutic. She was continued on Lovenox for 4  days even while on therapeutic and then discharged on therapeutic dose of  Coumadin.   Problem 2:  TOOTH PAIN:  The patient was complaining of tooth pain and a  Panorex scan showed apical lucencies on two of her teeth. Dentistry was  called and they saw her on the same day and felt like she would most likely  needed multiple extractions at a later date but at this point with her  symptoms not being particularly bad and with her being anticoagulated on  Coumadin, felt like this was not a pressing issue. She will need to be  reassessed by dentistry in the future. They recommended continuing  clindamycin for these tooth abscesses. Please see dictated dental consult on  the medical chart.   Problem 3:  HISTORY OF MIGRAINE HEADACHES:  The patient was complaining of  headache throughout the admission and says she has chronic history of  headaches but is unable to afford migraine medications. She is placed on  propranolol t.i.d. for migraine prophylaxis.   DISCHARGE LABORATORY DATA:  On the day of discharge the patient a white  count 6.7, hemoglobin 12.1, platelets 393,000. INR was 3.3, TSH 1.518,  hemoglobin A1c was unable to determine secondary to abnormal hemoglobin.       WW/MEDQ  D:  10/07/2004  T:  10/07/2004  Job:  [id number]   cc:   Darrol Jump, MD Fax: [phone]

## 2010-09-17 NOTE — Discharge Summary (Signed)
[name], [name] NO.:  [id number]   MEDICAL RECORD NO.:  [id number]          PATIENT TYPE:  INP   LOCATION:  5733                         FACILITY:  [facility]   PHYSICIAN:  [name], M.D.       DATE OF BIRTH:  11/15/1955   DATE OF ADMISSION:  02/16/2004  DATE OF DISCHARGE:  02/19/2004                                 DISCHARGE SUMMARY   DISCHARGE DIAGNOSES:  1.  Acute gastroenteritis.  2.  Mild acute pancreatitis secondary to nausea and vomiting.  3.  Diabetes mellitus.  4.  Hypokalemia, resolved.  5.  Gastroesophageal reflux disease.  6.  Anxiety.  7.  History of poorly defined seizure disorder without any seizure activity      in four years.  8.  Hypertension.  9.  Hysterectomy.  10. Caries.   DISCHARGE MEDICATIONS:  1.  Metformin 500 mg p.o. b.i.d.  2.  Phenergan 25 mg p.o. q.8h p.r.n. nausea.  3.  Vicodin 5/500 p.o. q.6h p.r.n. pain.   The patient was discharged in good condition.  She was able to tolerate a  diet.  She had minimal discomfort by the time of discharge.  She was  instructed to follow up at [facility] with her primary  care physician Dr. Darrol Munoz in December 2005, the exact date and time of  the appointment to be scheduled over the phone later on.   PROCEDURES:  No procedures were done.   CONSULTATIONS:  No consultations were obtained.   BRIEF ADMITTING HISTORY AND PHYSICAL WITH ADMISSION LABS:  [name] is a  55 year old African-American woman with diabetes, hypertension, and pancreas  divisum with history of mild acute pancreatitis who presented to the  emergency room on the day of admission with nausea, vomiting, abdominal pain  with time of onset three days prior to this admission.  On February 13, 2004,  she was having lunch and started having nausea, vomiting, and then she was  experiencing severe abdominal pain, and she was not feeling good.  Physical  exam on admission revealed a African-American  woman a bit lethargic from the  morphine and Dilaudid she was receiving in no acute distress.  Afebrile,  with Munoz pressure of 163/99, saturation of 100% on room air.  She had an  abdomen which was soft, positive  bowel sounds, tenderness over the  epigastrium with deep palpation.  There was no guarding and no rebound, and  no hepatosplenomegaly.  Her heart was regular with murmurs, rubs or gallops.  Lungs were clear without wheezes, rhonchi, or crackles.  Lab values at the  time of admission revealed a glucose 281, potassium 3.6, bicarbonate 25,  lipase 69, AST, ALT, alkaline phosphatase and bilirubin within normal  limits.  She had a white Munoz cell count of 6000 and hemoglobin of 13.5.   HOSPITAL COURSE:  1.  Acute gastroenteritis with mild acute pancreatitis.  [name] was      admitted to inpatient service for mild acute pancreatitis.  She had an      abdominal ultrasound which was negative for gallstone.  She had a CT of      the abdomen which did not reveal an acute inflammation of the pancreas.      She was kept n.p.o. for 24 hours, then was started on clear liquid diet.      She was transitioned from intravenous to oral analgesics and antiemetics      and she did well during hospitalization.  By the time of discharge, she      was able to tolerate liquids and was able to function pretty well.  2.  Diabetes mellitus.  At the time of admission it was found out that Ms.      McKinnon had decided on her own to stop her treatment for diabetes      because she thought she was doing too good to take any more pills.      While in the hospital, we checked a hemoglobin A1c which was still      pending at the time of discharge.  Throughout the hospitalization, her      diabetes was kept under control with insulin sliding scale.  We      recommended that she resume Metformin at the time of discharge.  3.  Hypertension.  This will need to be addressed at a later visit when Ms.       [name] comes for hospital followup with her primary care physician.   DISCHARGE LABS:  Sodium 140, potassium 3.4, chloride 110, bicarb 24, BUN 6,  creatinine 0.7, glucose 195, calcium 8.6, Helicobacter pylori antibodies  negative.  CBC 7.5, hemoglobin 12.1, hematocrit 35.7, platelets 351.       SL/MEDQ  D:  02/19/2004  T:  02/19/2004  Job:  [id number]   cc:   [facility]   Eileen Jump, MD  Fax: [phone]

## 2010-09-17 NOTE — Discharge Summary (Signed)
[name], [name] NO.:  [id number]   MEDICAL RECORD NO.:  [id number]          PATIENT TYPE:  INP   LOCATION:  3021                         FACILITY:  [facility]   PHYSICIAN:  Adonis Housekeeper, M.D.      DATE OF BIRTH:  March 17, 1956   DATE OF ADMISSION:  02/23/2004  DATE OF DISCHARGE:  03/01/2004                                 DISCHARGE SUMMARY   PRIMARY CARE PHYSICIAN:  Dr. Darrol Jump.   DISCHARGE DIAGNOSES:  1.  Abdominal pain, unknown source, most likely chronic origin.  2.  Diarrhea with negative stool studies, resolved on discharge.  3.  Reflux.  4.  Diabetes type 2 on oral medications.  5.  Hypertension.  6.  Status post hysterectomy.  7.  Beta thalassemia.  8.  History of poorly defined seizure disorder.  9.  History of atypical chest pain in 1999 with Cardiolite in 1999 showing      no ischemia with an ejection fraction of about 50%.  10. Anxiety.   DISCHARGE MEDICATIONS:  1.  Metformin 5 mg one p.o. b.i.d.  2.  Phenergan 25 mg one p.o. q.8 hours p.r.n. as needed for nausea.  3.  Imodium 2 mg after each stool up to 5 tablets a day.  4.  Vicodin 5/500 one p.o. q.6 hours p.r.n. for pain, 40 tablets given.   FOLLOW UP:  Patient is to see Dr. Salomon Mast in the outpatient clinic for  [facility] visit sometime in December for which she has to make an  appointment on her own at [phone]. No labs need to be done in the near  future.   PROCEDURES PERFORMED IN HOSPITAL:  1.  Colonoscopy February 27, 2004, normal.  2.  EGD on February 27, 2004 had to be canceled secondary to patient      vomiting.  3.  CT scan of abdomen on February 24, 2004, showed pancreatic duct appears      slightly more prominent than on the previous scan from February 16, 2004      measuring about 3-4 mm. No obstructive stone or mass present. No edema,      pseudocyst or aspirate. Status post hysterectomy, and no abnormalities      in pelvis.  4.  Abdominal series on February 27, 2004, no  acute abnormality. Gas      throughout the colon without evidence of air pneumoperitoneum.  5.  Chest x-ray on February 27, 2004, no acute abnormality.   HOSPITAL COURSE:  On admission, [name] is a 55 year old African-  American female, past history significant for diabetes, reflux, hypertension  who had been recently admitted from February 09, 2004 to February 19, 2004 for  gastroenteritis, mild pancreatitis, came into the ER on February 24, 2004,  with the complaint of increasing abdominal pain and diarrhea for one day.  Pain in abdomen was cramping in nature causing her to feel like she needs to  have a bowel movement.  Bowel movements have been multiple times throughout  the day, watery and yellow in color. Patient took one temperature of 100  degrees Fahrenheit. She  had some chills, some nausea, no vomiting. No chest  pain, no dyspnea.   ALLERGIES:  SULFA due to thalassemia.   SUBSTANCE HISTORY:  She is a former smoker, smoked half a pack a day for  several years and quit 4 years ago. No alcohol. No cocaine. No IV drugs.   SOCIAL HISTORY:  She is widowed. She is single. Works 2 jobs. Self pay.  Lives with her fiance.   FAMILY HISTORY:  Mother is 83 years old and healthy. Father alive at 39 with  diabetes. Two children. One brother, one sister living, as well as 2 sisters  and 2 brothers who died in a fire.   Vital signs on day of admission pulse 101, blood pressure 102/67,  temperature 97.6, respiratory rate 20, O2 SAT 96% on room air. She was not  orthostatic. Generally, she was awake, alert and oriented. Eyes:  PERLA,  extraocular movements intact. ENT:  Ears normal. Throat no exudate. Neck:  No JVD. No thyromegaly. Respiratory:  Clear to auscultation bilaterally. No  wheezes, rhonchi or crepitations. Cardiovascular:  Regular rate and rhythm.  No murmurs, rubs or gallops. GI:  Soft. Epigastric tenderness as well as  left upper quadrant tenderness. Good bowel sounds are  heard. Extremities: No  cyanosis or clubbing. Skin:  No rash.  No lymphadenopathy. Neurological:  Grossly intact. Psych:  Appropriate.   LABS ON DAY OF ADMISSION:  Sodium 136, potassium 3.8, chloride 101, bicarb  26, BUN 15, creatinine 1, serum glucose 153. White count 8.7, hemoglobin  13.6, hematocrit 39.7, platelets 386. Total bili 0.7, alk phos 79, AST 21,  ALT 20, protein 7.8, albumin 3.9, calcium 9.4. Lipase 36, amylase 158. UA  ketones negative. Blood negative. Protein negative. Nitrite negative.  Leukocyte esterase trace. CT of abdomen was negative. See above. Abdominal x-  ray no acute cardiopulmonary disease, nondilated small bowel loops without  air-fluid levels. Cannot _________, make sure to follow up with this.   HOSPITAL COURSE BY PROBLEM:  1.  Abdominal pain/diarrhea.  In regards to this, patient was brought in,      given IV fluids, was kept n.p.o., and started on Cipro and Flagyl for      first day. Stools studies were done which showed basically C-diff      negative, stool WBC negative, stools cultures were negative and      Giardia/crypto negative. GI was consulted, Dr. __________ saw the      patient. The patient was given Imodium but still continued to have      abdominal pain even though diarrhea resolved. The patient was therefore      taken for an EGD and colonoscopy which were negative as well as small      bowel follow through which was negative. Near the day of discharge, her      diarrhea had resolved completely and patient was put back on a full      diet. She still continued to complain of some abdominal pain, which was      thought probably chronic and may need pain clinic referral as an      outpatient. A question was raised regarding whether a pancreatic      ____________ which was seen in an earlier CT would be a cause of pain      for her but she did not come in she says on this admission with     pancreatitis, that it was really just diarrhea. We did  talk to  Dr.      Laural Benes regarding whether she needs surgery or procedure would be      applicable with the pancreatitis and he said that in her this was not      the cause of her pain.  2.  Diabetes:  Her sugars remained pretty reasonable throughout admission.      When she was n.p.o. she was on sliding scale insulin and then switched      back over to Metformin 500 twice a day on day prior to discharge.  3.  Hypovolemia secondary to diarrhea:  Magnesium was checked. It was low at      1.3. She was repleted with 2 grams of IV and also potassium was      repleted.  4.  Hypertension:  At some point during her admission, her blood pressure      was elevated, probably secondary to pain but on day of discharge it was      122/77. She will be watched as an outpatient.  5.  Disposition:  The exact cause of abdominal pain not known at this time.      This may be something chronic which      may have to be followed up with pain specialist as an outpatient. On the      day of discharge, sodium 141, potassium 3.4, chloride 106, bicarb 30,      BUN __________, creatinine 0.7, and serum glucose 91, hemoglobin 9.3,      hematocrit 38.5, white count 6.3, platelets 382.       TS/MEDQ  D:  03/01/2004  T:  03/01/2004  Job:  [id number]   cc:   Danise Edge, M.D.  [address]  [place]  [place] [address]  Fax: [phone]

## 2010-09-17 NOTE — Discharge Summary (Signed)
[name], KINGMA NO.:  [id number]   MEDICAL RECORD NO.:  [id number]          PATIENT TYPE:  INP   LOCATION:  3712                         FACILITY:  [facility]   PHYSICIAN:  Antony Contras, M.D.     DATE OF BIRTH:  1955/07/17   DATE OF ADMISSION:  12/12/2005  DATE OF DISCHARGE:  12/14/2005                                 DISCHARGE SUMMARY   ATTENDING PHYSICIAN:  C. Ulyess Mort, M.D.   DISCHARGE DIAGNOSES:  1. Noncardiac chest pain.  2. Abdominal pain of unclear etiology.  3. Diabetes mellitus type 2  4. Hypertension.  5. Chronic joint pain.  6. Sickle cell trait.  7. Beta thalassemia.  8. Hyperlipidemia.  9. Questionable history of pulmonary embolism.  10.Seizure disorder.  11.History of migraine headache.  12.Multiple hospital admissions, ED visits, and clinic visits for pain of      unclear etiology.  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 Metformin 500 mg p.o. b.i.d.  2. Lisinopril 20 mg p.o. daily.  3. Tramadol 25 mg p.o. q.6 hours as needed for pain x4 days only.   DISPOSITION AND FOLLOWUP:  The patient will see Dr. Ulyess Mort in the  outpatient clinic of [facility] on December 28, 2005 at 4 p.m.  There  is a hypercoabuability workup panel pending which should be followed up.  The patient was discharged in stable condition after having a Myoview  examination which raised the question of a debatable area of ischemia that  was entirely reperfused.  Discussion with Dr. Eden Emms, however, after his  close interpretation of the examination revealed the Myoview to be entirely  within normal limits.  Issues to be addressed at the outpatient clinic visit  include of course her conditions of diabetes and hypertension.  More acute  issues to be address include any recurrence of pain, and if the patient had  given further consideration to the idea that stress, anxiety, and/or  depression may be  playing a role in her recurrent pain.   PROCEDURES PERFORMED:  On December 14, 2005, the patient had a Myoview  examination performed.  On December 13, 2005, the patient had a 2 dimensional  cardio echogram performed.  On the day of admission, the patient had an  abdominal and pelvic CT examination performed.   CONCLUSION:  Cardiology consultation was obtained.  Patient was seen and  evaluated by Dr. Eden Emms.   BRIEF ADMISSION HISTORY:  [name] is a 55 year old African -American  woman with a history of diabetes, hypertension, smoking, and a questionable  history of PE and Lupus anticoagulant who presented to the outpatient clinic  to see Dr. Frederico Hamman with a diffuse array of complaints, but of utmost  importance were her complaints of chest pain, right upper quadrant abdominal  pain and throat pain.  The patient begin having chest pain the day prior to  her hospital admission.  She describes the pain as agonizing, squeezing  and/or choking in nature.  The anatomical description of the pain is as  follows:  Right upper quadrant radiating midline through  the sternum  bypassing the left chest and affecting the left shoulder.  The patient has  had this pain in the past.  It is not made worse with exertion.  She denies  any diaphoresis and/or shortness of breath.  She does have nausea.  She also  endorses right upper quadrant pain that is also throbbing in nature.  She  had one episode of vomiting in the outpatient clinic today.  She describes  her throat pain again, as a choking sensation, as in a person is physically  chocking her is how she describes.  Apparently, the patient became quite  upset in the outpatient clinic, and at points was writhing around in pain on  the bed complaining of severe chest pain.  She denies any known sick  contacts.  However, she does report that she has been under increase stress  lately.  Her son was married the Saturday prior to her Monday admission.  The  patient organized the entire wedding and had a kind of a Child psychotherapist of  ceremonies duty.  The son had left a day prior to admission for his  honeymoon, and the patient was distraught about this.  Of note, the  patient's throat and/or neck pain is not exacerbated by food and /or  liquids, and essentially denies dysphagia, odynophagia.   ADMISSION PHYSICAL EXAM:  The patient was examined by resident Deatra James after  she had arrived at the floor having received reportedly 1 mg Morphine IM.  VITAL SIGNS:  Temperature was 98.6 degrees Fahrenheit, blood pressure  156/88, pulse was 84, respiratory rate 18, O2 sats were 99% on room air.  GENERAL:  In general, the patient was sitting a wheel chair being pushed by  her son.  She was in no apparent distress and was smiling.  EYES:  Eye examination was entirely normal.  OROPHARYNX:  Posterior oropharynx was non-erythematous.  There was no  exudates.  NECK:  Her neck was supple, nontender.  There was no thyromegaly, no JVD.  LUNGS:  Examination of her lungs revealed clear breath sounds bilaterally.  No wheezes, rhonchi or rales.  CARDIOVASCULAR:  Cardiovascular exam was entirely normal with no murmurs,  rubs, or gallops.  She was in a regular rate with a regular rhythm.  ABDOMEN:  She had normoactive bowel sounds.  She was soft, nontender to deep  palpation.  She was nondistended.  There was no rebound, no guarding, and no  hepatosplenomegaly.  EXTREMITIES:  No cyanosis, clubbing, edema.  SKIN:  Skin was normal.  There was no rash, brakes or lesions.  She had no  palpable lymphadenopathy.  MUSCULOSKELETAL:  Musculoskeletal strength was full 5/5 in all extremities.  NEUROLOGICAL:  She was alert and oriented x3.  She did have somewhat of flat  affect.  Occasionally speaking what seemed to be childish and/or baby speak.  Cranial nerves II-XII are grossly intact.  Admission labs revealed white blood cells of 7.4, H and H of 13.4 and 39.8,  platelets were 454,  sodium 137, potassium 3.9, chloride 99, bicarb of 30,  BUN of 9, creatinine 0.7, glucose 104, bilirubin 0.7, alk phos 75, AST 20,  ALT 14, protein slightly elevated at 8.4, albumin 4.4, calcium slightly  elevated 10.7.  Urine drug screen was negative.  Lipase was 34.  Point of  care cardiac markers revealed CK total of 171 normal, CK-MB 1.7 normal,  troponin 0.01 normal.  She had a PT of 12.8.   PROBLEMS:  1. Chest pain.  She was admitted to [facility] to rule out acute      coronary syndrome.  Admission EKG was entirely normal.  Cardiac markers      were all within normal limits x3.  Treatment team was then faced with      the dilemma of how to further evaluate the patient.  The patient is      known to have chronic chest pain, which has really been of unclear      etiology despite multiple hospital admissions and ED visits.      Cardiology consult was obtained with the thought of ruling out any      cardiac source of her pain once and for all.  This was thought to be      achievable by noninvasive testing modalities.  Dr. Eden Emms evaluated the      patient and similar to the treatment team's concluded that the source      of this patient's pain was almost certainly not secondary to cardiac      causes.  A Myoview was performed and again there was a very small      questionable area of ischemia and/or poor reperfusion.  However, Dr.      Eden Emms personally reviewed the Myoview and found it to be entirely      within normal limits.  The treatment team was comfortable discharging      the patient with no further cardiac workup.  However, the issue of this      chronic chest pain and multiple hospital admissions was attempted to be      addressed.  The attending physician had a detailed conversation with      the patient regarding the etiology of her pain.  The patient clearly      became frustrated with this, and quite defensive.  During the      conversation the patient was asked  if she would interested in a      possible psychiatric and/or psychological evaluation for possible      anxiety and/or depression manifestations as physical pain.  She refused      to accept the idea that her pain may have any basis in stress and/or      psychiatric illness.  However, on the day of discharge approximately a      half an hour before the patient left the hospital she did report to the      resident that she had been under a lot of stress with her son being      married and him leaving, and that she was actually open to considering      the idea of stress being manifested as perceived pain.  This I think      was a large insight on her part.  It should definitely be pursued in      further outpatient clinic evaluations, th possible referral for      psychology and/or therapy for coping skills and/or behavioral      modification.  1. Chronic abdominal pain.  The patient reported having right upper      quadrant pain, nausea, and vomiting.  For this reason and abdominal      ultrasound was obtained which was entirely normal.  The patient had no      evidence of biliary disease.  The patient had normal liver function      tests, normal lipase.  The patient actually was eating a  regular diet      throughout her hospital admission.  For example, on day one the patient      was finishing a large cheeseburger, a large serving of macaroni and      cheese and salad with ranch dressing with no difficulty.  However, even      during this time she would retch on the bed as if in severe pain.      Really the etiology of her abdominal pain is not clear.  It was thought      on admission, that she had a prior upper endoscopy, however upon      detailed review of her hospital records this is actually one of the      only test that has not been performed on her at this point.  This is a      surprising fact.  She has had multiple, multiple abdominal as well as      chest CT examinations.   It is actually quite astounding to look at      amount of radiological studies she has had performed.  Patient was      agreeable to the plan, however to treat her abdominal pain which she      described several times as indigestion with over-the-counter antacids      which apparently have worked in the past.   1. Questionable history of pulmonary embolism.  The patient had been      admitted one year prior to the B service was found on ventilation      profusion scanning to have ventilation defects in the right middle I      believe, and left lower lobe.  Treatment team really wondered why a VQ      scan was performed rather than a CT angiogram.  Review of the VQ scan      with the radiologist on-call on the second day of hospital admission      really revealed equivocal results which most likely would be      interpreted as moderate probability, possibly even low probability.      However, on-call radiologist seemed to want to go one way or the other      and agreed with the original interpretation of high probability.      Further review of the medical records from that admission to learn of      reasoning as to why the CT angiogram had not been pursued revealed that      the emergency department had actually pursued the diagnosis of      pulmonary embolism.  On the admission history and physical, the only      report of this was that the patient had poor peripheral veins and for      that reason VQ scan was performed.  However, this is not entirely      logical as the perfusion portion of the VQ scan requires IV access.  I      would lean toward scratching the diagnosis of pulmonary embolism from      the patient's record.  However, this can be determined by her      continuity physician, Dr. Frederico Hamman.   1. History of positive Lupus anticoagulant.  Patient had tested positive     once for a PTT-LA prolongation of Lupus anticoagulant assay.      Apparently this is confounded by the fact  that the patient had been on  Lovenox.  For this reason, the patient who had received no Lovenox on      this hospital admission had a repeat hypercoagulability panel drawn.      Results of this were pending at the time of discharge, but certainly      should be followed up on her outpatient clinic visits.  If this      diagnosis is in question, and it'spossible lab error and/or false      positive as well as the above mentioned pulmonary embolism, then      reconsideration shoudl be given to the patient carrying the label of      having had a past PE.   1. Diabetes mellitus type 2.  An A1c from several months ago that was a      6.6.  She was maintained in the hospital on sliding scale insulin as we      assumed she would be NPO for any possible non-evasive cardiac imaging      and/or this excruciating abdominal pain that she described.  However,      she continued to eat throughout the hospitalization, but was simply      discharged on her home regimen of metformin.   1. Hypertension.  The patient was normotensive throughout her hospital      admission.  Apparently, she has been quite nonadherent to her      prescription medications.  She was discharged and instructed to resume      taking her lisinopril 20 mg p.o. daily.   1. Chronic pain.  The patient repeatedly asked for pain medications      throughout the hospitalization.  However, it was thought best to avoid      narcotic pain medication without a clear etiology of treatment.      Treatment team leaned towards treating with Tylenol at first.  However,      the patient continued to complain of pain and it was decided to treat      with Tramadol, low dose 25 mg p.o.  Patient was given 4 day      prescription for this.  I thought this would be a reasonable plan for      relief of this of this acute exacerbation of pain that required      hospitalization.  She was given no refills.   DISCHARGE LABS AND VITALS:  On the day of  discharge, the patient's vitals  were temperature 98.7, blood pressure 119/64, pulse 55, respiratory rate 20,  sating 99% on 2 L.  White blood cell count was 6.1, H and H 11.0 and 32.3,  platelets 379, sodium 144, potassium 3.9, chloride 113, bicarb 26, BUN 9,  creatinine 0.8, glucose 130, calcium 8.9.      Antony Contras, M.D.  Electronically Signed     GL/MEDQ  D:  12/15/2005  T:  12/15/2005  Job:  [id number]   cc:   Noralyn Pick. Eden Emms, MD,FACC

## 2010-09-17 NOTE — Discharge Summary (Signed)
[place]  Patient:    [name], [name]                    MRN: [id number] Adm. Date:  [id number] Disc. Date: [id number] Attending:  Madaline Guthrie Dictator:   Duncan Dull, M.D.                           Discharge Summary  PROCEDURES:  A magnetic retrograde cholangiopancreatography on August 26, 2000. Impression:  1. Normal appearance of the biliary system and gallbladder. 2. Probable pancreas divisum without definite changes of pancreatitis.  HOSPITAL COURSE: #1 - ABDOMINAL PAIN:  Because of the mildly prominent intrahepatic biliary duct and pancreatic duct seen on August 24, 2000, CT of the abdomen, an MRCP was obtained on August 26, 2000, which showed no evidence of intraluminal filling defects to suggest choledocholithiasis.  In addition, the gallbladder looked normal and the pancreatic duct, while prominent, measured approximately 3 mm in diameter with no definite changes of pancreatitis, but findings suggestive of pancreatic divisum. DD:  09/12/00 TD:  09/13/00 Job: [id number]

## 2010-09-23 ENCOUNTER — Other Ambulatory Visit: Payer: Self-pay | Admitting: *Deleted

## 2010-09-23 ENCOUNTER — Other Ambulatory Visit: Payer: Self-pay | Admitting: Internal Medicine

## 2010-09-23 MED ORDER — METFORMIN HCL 1000 MG PO TABS: 1000.0000 mg | ORAL_TABLET | Freq: Two times a day (BID) | ORAL | Status: DC

## 2010-09-23 NOTE — Progress Notes (Signed)
Patient on Metformin 500mg  po 2tabs BID, and requests 90 day supply. Will change to Metformin 1000mg  po BID so as to require her to take less pills with equivocal potency.  Johnette Abraham, D.O.

## 2010-09-23 NOTE — Telephone Encounter (Signed)
[name], please let [name] know that I changed her Metformin from 500mg  po 2 tabs BID to the Metformin 1000mg  tabs po BID, so that she will get the same potency of medication with fewer pills. I have sent her 90day supply to [facility].  Thank you.  Johnette Abraham, D.O.

## 2010-09-24 NOTE — Telephone Encounter (Signed)
Pt informed and voices understanding 

## 2010-10-04 ENCOUNTER — Other Ambulatory Visit: Payer: Self-pay | Admitting: *Deleted

## 2010-10-04 ENCOUNTER — Encounter: Payer: Self-pay | Admitting: Internal Medicine

## 2010-10-04 MED ORDER — HYDROCODONE-ACETAMINOPHEN 5-500 MG PO CAPS: 1.0000 | ORAL_CAPSULE | Freq: Four times a day (QID) | ORAL | Status: DC | PRN

## 2010-10-04 NOTE — Progress Notes (Signed)
This encounter was created in error - please disregard.

## 2010-10-04 NOTE — Telephone Encounter (Addendum)
Call from pt requesting refill on Vicodin.  Said she was having pain today.  Spoke with Dr. Thad Ranger pt is to make an appointment in the Clinics and with her GI.  Pt RTC has an appointment with the Clinics as well as with the GI.

## 2010-10-08 ENCOUNTER — Encounter: Payer: Self-pay | Admitting: Internal Medicine

## 2010-10-08 ENCOUNTER — Other Ambulatory Visit: Payer: Self-pay | Admitting: *Deleted

## 2010-10-08 ENCOUNTER — Ambulatory Visit (INDEPENDENT_AMBULATORY_CARE_PROVIDER_SITE_OTHER): Payer: BC Managed Care – PPO | Admitting: Internal Medicine

## 2010-10-08 VITALS — BP 145/89 | HR 81 | Temp 98.1°F | Ht 60.0 in | Wt 121.1 lb

## 2010-10-08 DIAGNOSIS — E119 Type 2 diabetes mellitus without complications: Secondary | ICD-10-CM

## 2010-10-08 DIAGNOSIS — E785 Hyperlipidemia, unspecified: Secondary | ICD-10-CM

## 2010-10-08 DIAGNOSIS — Q453 Other congenital malformations of pancreas and pancreatic duct: Secondary | ICD-10-CM

## 2010-10-08 LAB — POCT GLYCOSYLATED HEMOGLOBIN (HGB A1C): Hemoglobin A1C: 8.4

## 2010-10-08 MED ORDER — AMLODIPINE BESYLATE 5 MG PO TABS: 5.0000 mg | ORAL_TABLET | Freq: Every day | ORAL | Status: DC

## 2010-10-08 MED ORDER — METFORMIN HCL 1000 MG PO TABS: 1000.0000 mg | ORAL_TABLET | Freq: Two times a day (BID) | ORAL | Status: DC

## 2010-10-08 NOTE — Patient Instructions (Signed)
Please check your blood sugars on a daily basis before breakfast.

## 2010-10-09 NOTE — Assessment & Plan Note (Signed)
Patient is followed by [facility]. Has an appointment next week for a follow up.

## 2010-10-09 NOTE — Assessment & Plan Note (Signed)
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

## 2010-10-09 NOTE — Assessment & Plan Note (Addendum)
Hgb A1C today is 8.4 which has worsen since the last visit. Patient does not check blood glucose level on a regular basis since patient can not effort any test strips. Patient noted that she was recommended to continue on Metformin 500 mg bid. Reviewing her Hgb A1c increased from 6.4 in Jan/2012 to 8.4 today I will increase Metformin to 1000 mg bid at this point to achieve better control. Furthermore I will refer the patient to ophthalmology for regular eye exam.

## 2010-10-09 NOTE — Progress Notes (Signed)
  Subjective:    Patient ID: Lysette Lindenbaum, female    DOB: 1955-09-07, 55 y.o.   MRN: [id number]  HPI This is a 55 year old female with PMH significant for DM, HTN, Pancreas dvisum who presented to the clinic for a regular office visit. She noted 2 weeks she was not feeling well : Very fatigue and loss of energy. Loss of appetite. But now she feels a lot better. She contributed the fatigue due to increased work load. Now she has summer break and has more time for herself.    Review of Systems  Constitutional: Negative for fever, chills and appetite change.  Eyes: Negative for visual disturbance.  Respiratory: Negative for chest tightness, shortness of breath and wheezing.   Cardiovascular: Negative for chest pain and palpitations.  Gastrointestinal: Negative for abdominal pain, constipation and abdominal distention.  Genitourinary: Negative for difficulty urinating and vaginal pain.  Musculoskeletal: Negative for arthralgias.  Neurological: Negative for dizziness, weakness and numbness.       Objective:   Physical Exam  Constitutional: She is oriented to person, place, and time. She appears well-developed.  HENT:  Head: Normocephalic and atraumatic.  Neck: Neck supple.  Cardiovascular: Normal rate, regular rhythm and normal heart sounds.   Pulmonary/Chest: Effort normal and breath sounds normal. No respiratory distress. She has no wheezes.  Abdominal: Soft. Bowel sounds are normal. She exhibits distension. There is no tenderness. There is no rebound and no guarding.  Musculoskeletal: Normal range of motion.  Neurological: She is alert and oriented to person, place, and time.          Assessment & Plan:

## 2010-10-12 NOTE — Progress Notes (Signed)
Message left on home phone ID recording - eye appt Tomasita Morrow 10/20/10 3PM - info faxed. Stanton Kidney Wash Nienhaus RN 10/12/10 1:15PM

## 2010-10-21 ENCOUNTER — Encounter: Payer: BC Managed Care – PPO | Admitting: Internal Medicine

## 2010-10-28 ENCOUNTER — Other Ambulatory Visit: Payer: Self-pay | Admitting: *Deleted

## 2010-10-28 DIAGNOSIS — Q453 Other congenital malformations of pancreas and pancreatic duct: Secondary | ICD-10-CM

## 2010-10-30 ENCOUNTER — Emergency Department (HOSPITAL_COMMUNITY)
Admission: EM | Admit: 2010-10-30 | Discharge: 2010-10-30 | Disposition: A | Discharge status: Home / Self Care | Payer: BC Managed Care – PPO | Attending: Emergency Medicine | Admitting: Emergency Medicine

## 2010-10-30 DIAGNOSIS — H53149 Visual discomfort, unspecified: Secondary | ICD-10-CM | POA: Insufficient documentation

## 2010-10-30 DIAGNOSIS — R110 Nausea: Secondary | ICD-10-CM | POA: Insufficient documentation

## 2010-10-30 DIAGNOSIS — Z86718 Personal history of other venous thrombosis and embolism: Secondary | ICD-10-CM | POA: Insufficient documentation

## 2010-10-30 DIAGNOSIS — D561 Beta thalassemia: Secondary | ICD-10-CM | POA: Insufficient documentation

## 2010-10-30 DIAGNOSIS — I10 Essential (primary) hypertension: Secondary | ICD-10-CM | POA: Insufficient documentation

## 2010-10-30 DIAGNOSIS — Z8673 Personal history of transient ischemic attack (TIA), and cerebral infarction without residual deficits: Secondary | ICD-10-CM | POA: Insufficient documentation

## 2010-10-30 DIAGNOSIS — E119 Type 2 diabetes mellitus without complications: Secondary | ICD-10-CM | POA: Insufficient documentation

## 2010-10-30 DIAGNOSIS — Z79899 Other long term (current) drug therapy: Secondary | ICD-10-CM | POA: Insufficient documentation

## 2010-10-30 DIAGNOSIS — Z8542 Personal history of malignant neoplasm of other parts of uterus: Secondary | ICD-10-CM | POA: Insufficient documentation

## 2010-10-30 DIAGNOSIS — G43909 Migraine, unspecified, not intractable, without status migrainosus: Secondary | ICD-10-CM | POA: Insufficient documentation

## 2010-11-01 MED ORDER — HYDROCODONE-ACETAMINOPHEN 5-500 MG PO CAPS: 1.0000 | ORAL_CAPSULE | Freq: Four times a day (QID) | ORAL | Status: AC | PRN

## 2010-11-01 MED ORDER — PROMETHAZINE HCL 12.5 MG PO TABS: 25.0000 mg | ORAL_TABLET | Freq: Three times a day (TID) | ORAL | Status: DC | PRN

## 2010-11-01 NOTE — Telephone Encounter (Signed)
Patient needs to be seen in clinic before i provide further narcotics, she is supposed to be getting them only for acute pancreatitis, not as a basal pain regimen. She missed her appt with me 1-2 weeks ago, and only 1 refill will be given until she is seen to confirm she needs narcotics and phenergan. Also, if this is going to be a long term thing then we need a narcotic contract.  Johnette Abraham, D.O.

## 2010-11-01 NOTE — Telephone Encounter (Signed)
Rx called in and pt informed 

## 2010-11-18 ENCOUNTER — Encounter: Payer: BC Managed Care – PPO | Admitting: Internal Medicine

## 2010-11-22 ENCOUNTER — Inpatient Hospital Stay (HOSPITAL_COMMUNITY)
Admission: EM | Admit: 2010-11-22 | Discharge: 2010-11-30 | DRG: 204 | Disposition: A | Discharge status: Home / Self Care | Payer: BC Managed Care – PPO | Attending: Internal Medicine | Admitting: Internal Medicine

## 2010-11-22 ENCOUNTER — Encounter: Payer: Self-pay | Admitting: Ophthalmology

## 2010-11-22 ENCOUNTER — Telehealth: Payer: Self-pay | Admitting: *Deleted

## 2010-11-22 DIAGNOSIS — Q453 Other congenital malformations of pancreas and pancreatic duct: Secondary | ICD-10-CM

## 2010-11-22 DIAGNOSIS — Z8673 Personal history of transient ischemic attack (TIA), and cerebral infarction without residual deficits: Secondary | ICD-10-CM

## 2010-11-22 DIAGNOSIS — G43909 Migraine, unspecified, not intractable, without status migrainosus: Secondary | ICD-10-CM | POA: Diagnosis present

## 2010-11-22 DIAGNOSIS — E785 Hyperlipidemia, unspecified: Secondary | ICD-10-CM | POA: Diagnosis present

## 2010-11-22 DIAGNOSIS — K859 Acute pancreatitis without necrosis or infection, unspecified: Principal | ICD-10-CM | POA: Diagnosis present

## 2010-11-22 DIAGNOSIS — Z86711 Personal history of pulmonary embolism: Secondary | ICD-10-CM

## 2010-11-22 DIAGNOSIS — E119 Type 2 diabetes mellitus without complications: Secondary | ICD-10-CM | POA: Diagnosis present

## 2010-11-22 DIAGNOSIS — I10 Essential (primary) hypertension: Secondary | ICD-10-CM | POA: Diagnosis present

## 2010-11-22 DIAGNOSIS — Z79899 Other long term (current) drug therapy: Secondary | ICD-10-CM

## 2010-11-22 DIAGNOSIS — D561 Beta thalassemia: Secondary | ICD-10-CM | POA: Diagnosis present

## 2010-11-22 DIAGNOSIS — G40802 Other epilepsy, not intractable, without status epilepticus: Secondary | ICD-10-CM | POA: Diagnosis present

## 2010-11-22 LAB — DIFFERENTIAL
Basophils Absolute: 0 10*3/uL (ref 0.0–0.1)
Basophils Relative: 0 % (ref 0–1)
Eosinophils Absolute: 0.1 10*3/uL (ref 0.0–0.7)
Neutro Abs: 4.2 10*3/uL (ref 1.7–7.7)
Neutrophils Relative %: 49 % (ref 43–77)

## 2010-11-22 LAB — CBC
Platelets: 367 10*3/uL (ref 150–400)
RBC: 5.11 MIL/uL (ref 3.87–5.11)
WBC: 8.7 10*3/uL (ref 4.0–10.5)

## 2010-11-22 LAB — URINALYSIS, ROUTINE W REFLEX MICROSCOPIC
Bilirubin Urine: NEGATIVE
Glucose, UA: NEGATIVE mg/dL
Ketones, ur: NEGATIVE mg/dL
Nitrite: NEGATIVE
Protein, ur: NEGATIVE mg/dL
pH: 6.5 (ref 5.0–8.0)

## 2010-11-22 NOTE — Telephone Encounter (Signed)
She may want to go to [facility] if stabilizes at home so that she can be seen by her regular GI doctors who have been intermittently placing pancreatic stents. But of course if not stable she should go to closest ER.

## 2010-11-22 NOTE — Telephone Encounter (Signed)
Reviewing your note, as Dr. Coralee Pesa and you were suggesting, sounds like she is not stable enough to go to [facility], so calling 911 or going to closest ER is probably the best.

## 2010-11-22 NOTE — Telephone Encounter (Signed)
I agree

## 2010-11-22 NOTE — H&P (Signed)
Hospital Admission Note Date: 11/22/2010  Patient name: [name] Medical record number: [id number] Date of birth: 01-17-1956 Age: 55 y.o. Gender: female PCP: Suszanne Finch, DO  Medical Service: Internal Medicine Teaching Service  Attending physician:  Dr, Rogelia Boga Resident (R2/R3): Dr. Denton Meek  Pager: [phone] Resident (R1): Dr. Candy Sledge   Pager: [phone]  Chief Complaint: abdominal pain, vomiting, diarrhea  History of Present Illness: 55 year old woman with history of multiple admissions for pancreatitis (last in Dec of 2011 and Jan 2012) and congenital pancreas divisum presents with 1 day of 10/10 abdominal pain, vomiting, as well as more than 10 episodes of diarrhea. She reports not eating for the last two days, taking only tea, jello and a few crackers, eating them very slowly over the course of several hours. She reports feeling nauseated and bloated and having increased pain with intake of any food or drink in a normal serving size. She is followed at [facility], by Dr. Gery Pray. She has had two pancreatic stents placed, the first was placed in May 2011 and was removed in August of the same year. She had a second stent placed in March of 2012 and it was removed the next month. She reports having a great improvement in her symptoms with the stent, the reason for its removal is unclear. She reports she is not taking her pancrealipase since it did not provide a noticeable improvement in symptoms. She has not been using any medications for pain and was concerned since she has been hypoglycemic on previous admissions.  Meds: Medication Sig  . amLODipine (NORVASC) 5 MG tablet Take 1 tablet (5 mg total) by mouth daily.  . calcium carbonate (TUMS - DOSED IN MG ELEMENTAL CALCIUM) 500 MG chewable tablet Chew 1 tablet by mouth 2 (two) times daily as needed.    Marland Kitchen lisinopril (PRINIVIL,ZESTRIL) 20 MG tablet Take 1 tablet (20 mg total) by mouth daily.  Marland Kitchen loperamide  (IMODIUM A-D) 2 MG tablet Take 2 mg by mouth as needed. For diarrhea - not more than 6 tablets a day   . metFORMIN (GLUCOPHAGE) 1000 MG tablet Take 1 tablet (1,000 mg total) by mouth 2 (two) times daily with a meal.  . ondansetron (Zofran)  Take 1 tablets  by mouth as needed for nausea.   Allergies: Butalbital-apap-caffeine; Shrimp flavor; and Sulfonamide derivatives  Past Medical History  Diagnosis Date  . Hypertension   . HLD (hyperlipidemia) 2009  . Sickle cell trait   . Migraine headache   . Pulmonary embolus 2006    08/2004 -   Two areas of V/Q mismatch. Findings compatible  with high probability for pulmonary embolus.; pt was on coumadin for 1 year  . Anemia     beta thallasemia  . Pancreatic divisum     S/P ERCP with stenting 07/2010 at [facility], then stent removal (08/05/2010)  . Diabetes mellitus 2010    well controlled  . Seizure disorder   . History of migraine headaches   . Sickle cell trait   . Beta thalassemia   . CVA (cerebral infarction) 1993    Per report by patient she had a stroke and prolonged rehab course eventually resolving her right sided weakness, as per her hx obtained 08/2007   Past Surgical History  Procedure Date  . Abdominal hysterectomy age 30 yo    2/2 endometriosis  . Pancreatic stent placement/removal     2011 and 2012   Family History  Problem Relation Age of Onset  .  Prostate cancer Father   . Sickle cell anemia Brother   . Lung cancer Maternal Aunt   . Lung cancer Maternal Uncle   . Breast cancer Maternal Grandmother    History   Social History  . Marital Status: Single    Spouse Name: N/A    Number of Children: N/A  . Years of Education: N/A   Occupational History  . Not on file.   Social History Main Topics  . Smoking status: Former Smoker -- 0.5 packs/day for 30 years    Quit date: 05/02/2000  . Smokeless tobacco: Not on file  . Alcohol Use: No  . Drug Use: No  . Sexually Active: Yes   Other Topics Concern  . Not on  file   Social History Narrative   Works at [employer] as a Merchandiser, retail, on Health visitor about 9 hours/ day   Review of Systems: ROS otherwise negative except as noted in HPI.  Physical Exam:Vitals: T:   HR: 75   BP: 158/86  RR: 15  O2 saturation: 97% RA General: middle aged patient resting in bed, appears younger than stated age HEENT: PERRL, EOMI, no scleral icterus Cardiac: RRR, no rubs, murmurs or gallops Pulm: clear to auscultation bilaterally, moving normal volumes of air Abd: soft, tenderness to light palpation in epigastric area and LUQ, mildly distended, BS present Ext: warm and well perfused, no pedal edema Neuro: alert and oriented X3, cranial nerves II-XII grossly intact, strength and sensation to light touch equal in bilateral upper and lower extremities  Lab results:     Component Value Range   Color, Urine YELLOW  YELLOW    Appearance CLEAR  CLEAR    Specific Gravity, Urine 1.006  1.005 - 1.030    pH 6.5  5.0 - 8.0    Glucose, UA NEGATIVE  NEGATIVE (mg/dL)   Hgb urine dipstick NEGATIVE  NEGATIVE    Bilirubin Urine NEGATIVE  NEGATIVE    Ketones, ur NEGATIVE  NEGATIVE (mg/dL)   Protein, ur NEGATIVE  NEGATIVE (mg/dL)   Urobilinogen, UA 0.2  0.0 - 1.0 (mg/dL)   Nitrite NEGATIVE  NEGATIVE    Leukocytes, UA NEGATIVE  NEGATIVE   DIFFERENTIAL     Status: Normal      Component Value Range   Neutrophils Relative 49  43 - 77 (%)   Neutro Abs 4.2  1.7 - 7.7 (K/uL)   Lymphocytes Relative 41  12 - 46 (%)   Lymphs Abs 3.6  0.7 - 4.0 (K/uL)   Monocytes Relative 9  3 - 12 (%)   Monocytes Absolute 0.7  0.1 - 1.0 (K/uL)   Eosinophils Relative 1  0 - 5 (%)   Eosinophils Absolute 0.1  0.0 - 0.7 (K/uL)   Basophils Relative 0  0 - 1 (%)   Basophils Absolute 0.0  0.0 - 0.1 (K/uL)  CBC     Status: Abnormal      Component Value Range   WBC 8.7  4.0 - 10.5 (K/uL)   RBC 5.11  3.87 - 5.11 (MIL/uL)   Hemoglobin 13.8  12.0 - 15.0 (g/dL)   HCT 40.9  81.1 - 91.4 (%)   MCV 76.5 (*) 78.0 -  100.0 (fL)   MCH 27.0  26.0 - 34.0 (pg)   MCHC 35.3  30.0 - 36.0 (g/dL)   RDW 78.2  95.6 - 21.3 (%)   Platelets 367  150 - 400 (K/uL)  POCT I-STAT, CHEM 8     Status: Abnormal  Component Value Range   Sodium 139  135 - 145 (mEq/L)   Potassium 3.7  3.5 - 5.1 (mEq/L)   Chloride 103  96 - 112 (mEq/L)   BUN 8  6 - 23 (mg/dL)   Creatinine, Ser 9.14  0.50 - 1.10 (mg/dL)   Glucose, Bld 782 (*) 70 - 99 (mg/dL)   Calcium, Ion 9.56  2.13 - 1.32 (mmol/L)   TCO2 25  0 - 100 (mmol/L)   Hemoglobin 15.0  12.0 - 15.0 (g/dL)   HCT 08.6  57.8 - 46.9 (%)  HEPATIC FUNCTION PANEL     Status: Normal      Component Value Range   Total Protein 8.3  6.0 - 8.3 (g/dL)   Albumin 4.4  3.5 - 5.2 (g/dL)   AST 15  0 - 37 (U/L)   ALT 12  0 - 35 (U/L)   Alkaline Phosphatase 89  39 - 117 (U/L)   Total Bilirubin 0.4  0.3 - 1.2 (mg/dL)   Bilirubin, Direct 0.1  0.0 - 0.3 (mg/dL)   Indirect Bilirubin 0.3  0.3 - 0.9 (mg/dL)  LIPASE, BLOOD     Status: Abnormal      Component Value Range   Lipase 84 (*) 11 - 59 (U/L)   Other results: EKG: no major changes from previous EKG  Assessment & Plan by Problem:  1) Abdominal pain: Most likely secondary to acute pancreatitis considering her recurrent parotitis history due to pancreatic divisum. Lipase 89. Hepatic function panel normal. Ranson score of 1 (except LDH level not done).   - Admit regular bed. - Pain control with IV morphine - Zofran IV - Start on clear liquids and advance as tolerated. - Will try to get records from [facility] outpatient GI office visit to see if any further recommendations for management of her recurrent acute pancreatitis.  2) Diarrhea: About 10 loose watery stools on the day prior to admission which got resolved with Imodium. -Patient has his chronic diarrhea which responds to Imodium pill if she has anymore episodes will consider workup for her diarrhea but now considering her being stable and no more episodes of diarrhea and no  elevation in white count, would just observe.  3) DM 2: This HbA1c of 8.4 in June 2012.  - Will hold metformin in case she needs any IV contrast imagings. - Started on sliding scale sensitive with bed time coverage.  4) Hypertension: Mildly elevated in ED. - Will continue home meds.  5) VTE prophylaxis: Lovenox     R2/3______________________________      R1________________________________  ATTENDING: I performed and/or observed a history and physical examination of the patient.  I discussed the case with the residents as noted and reviewed the residents' notes.  I agree with the findings and plan--please refer to the attending physician note for more details.  Signature________________________________  Printed Name_____________________________

## 2010-11-22 NOTE — Telephone Encounter (Signed)
Pt calls short of breath, states she has been having flare up of pancreatitis since sat, she is having multiple diarrhea stools, severe nausea, severe abd pain (9/10), is having increased amts of bloating,  has taken in a few plain crackers since sat, sips of fluids, states she is weak and gets short of breath. She is ask to go to ED or call 911 asap.

## 2010-11-23 ENCOUNTER — Inpatient Hospital Stay (HOSPITAL_COMMUNITY): Payer: BC Managed Care – PPO

## 2010-11-23 LAB — POCT I-STAT, CHEM 8
Calcium, Ion: 1.23 mmol/L (ref 1.12–1.32)
HCT: 44 % (ref 36.0–46.0)
Sodium: 139 mEq/L (ref 135–145)
TCO2: 25 mmol/L (ref 0–100)

## 2010-11-23 LAB — BASIC METABOLIC PANEL
CO2: 26 mEq/L (ref 19–32)
CO2: 27 mEq/L (ref 19–32)
Chloride: 103 mEq/L (ref 96–112)
Chloride: 105 mEq/L (ref 96–112)
Creatinine, Ser: 0.54 mg/dL (ref 0.50–1.10)
GFR calc Af Amer: >60 mL/min (ref >60)
Glucose, Bld: 121 mg/dL — ABNORMAL HIGH (ref 70–99)
Potassium: 3.6 mEq/L (ref 3.5–5.1)
Sodium: 139 mEq/L (ref 135–145)

## 2010-11-23 LAB — HEPATIC FUNCTION PANEL
ALT: 12 U/L (ref 0–35)
AST: 15 U/L (ref 0–37)
Albumin: 4.4 g/dL (ref 3.5–5.2)
Alkaline Phosphatase: 89 U/L (ref 39–117)
Indirect Bilirubin: 0.3 mg/dL (ref 0.3–0.9)
Total Protein: 8.3 g/dL (ref 6.0–8.3)

## 2010-11-23 LAB — CBC
HCT: 36 % (ref 36.0–46.0)
Hemoglobin: 12.8 g/dL (ref 12.0–15.0)
MCH: 27.4 pg (ref 26.0–34.0)
MCV: 76.9 fL — ABNORMAL LOW (ref 78.0–100.0)
MCV: 77.9 fL — ABNORMAL LOW (ref 78.0–100.0)
Platelets: 373 10*3/uL (ref 150–400)
Platelets: 369 10*3/uL (ref 150–400)
RBC: 4.68 MIL/uL (ref 3.87–5.11)
RBC: 4.08 MIL/uL (ref 3.87–5.11)
WBC: 7.7 10*3/uL (ref 4.0–10.5)

## 2010-11-23 LAB — GLUCOSE, CAPILLARY
Glucose-Capillary: 152 mg/dL — ABNORMAL HIGH (ref 70–99)
Glucose-Capillary: 153 mg/dL — ABNORMAL HIGH (ref 70–99)
Glucose-Capillary: 158 mg/dL — ABNORMAL HIGH (ref 70–99)
Glucose-Capillary: 99 mg/dL (ref 70–99)

## 2010-11-23 LAB — AMYLASE: Amylase: 124 U/L — ABNORMAL HIGH (ref 0–105)

## 2010-11-24 ENCOUNTER — Inpatient Hospital Stay (HOSPITAL_COMMUNITY): Payer: BC Managed Care – PPO

## 2010-11-24 DIAGNOSIS — K859 Acute pancreatitis without necrosis or infection, unspecified: Secondary | ICD-10-CM

## 2010-11-24 LAB — CBC
Hemoglobin: 12.2 g/dL (ref 12.0–15.0)
MCH: 27 pg (ref 26.0–34.0)
MCV: 77.4 fL — ABNORMAL LOW (ref 78.0–100.0)
Platelets: 392 10*3/uL (ref 150–400)
RDW: 13.5 % (ref 11.5–15.5)

## 2010-11-24 LAB — BASIC METABOLIC PANEL
Calcium: 10.3 mg/dL (ref 8.4–10.5)
Creatinine, Ser: 0.55 mg/dL (ref 0.50–1.10)
GFR calc Af Amer: >60 mL/min (ref >60)
Potassium: 3.7 mEq/L (ref 3.5–5.1)

## 2010-11-24 LAB — GLUCOSE, CAPILLARY: Glucose-Capillary: 90 mg/dL (ref 70–99)

## 2010-11-24 LAB — LIPASE, BLOOD: Lipase: 55 U/L (ref 11–59)

## 2010-11-24 LAB — LIPID PANEL
Cholesterol: 203 mg/dL — ABNORMAL HIGH (ref 0–200)
VLDL: 23 mg/dL (ref 0–40)

## 2010-11-25 LAB — BASIC METABOLIC PANEL
BUN: 4 mg/dL — ABNORMAL LOW (ref 6–23)
CO2: 24 mEq/L (ref 19–32)
Calcium: 9.5 mg/dL (ref 8.4–10.5)
Creatinine, Ser: 0.53 mg/dL (ref 0.50–1.10)
GFR calc Af Amer: >60 mL/min (ref >60)
GFR calc non Af Amer: >60 mL/min (ref >60)
Glucose, Bld: 80 mg/dL (ref 70–99)
Sodium: 140 mEq/L (ref 135–145)

## 2010-11-25 LAB — GLUCOSE, CAPILLARY
Glucose-Capillary: 110 mg/dL — ABNORMAL HIGH (ref 70–99)
Glucose-Capillary: 141 mg/dL — ABNORMAL HIGH (ref 70–99)

## 2010-11-26 LAB — GLUCOSE, CAPILLARY
Glucose-Capillary: 218 mg/dL — ABNORMAL HIGH (ref 70–99)
Glucose-Capillary: 148 mg/dL — ABNORMAL HIGH (ref 70–99)

## 2010-11-27 LAB — GLUCOSE, CAPILLARY
Glucose-Capillary: 211 mg/dL — ABNORMAL HIGH (ref 70–99)
Glucose-Capillary: 207 mg/dL — ABNORMAL HIGH (ref 70–99)
Glucose-Capillary: 94 mg/dL (ref 70–99)

## 2010-11-27 LAB — BASIC METABOLIC PANEL
CO2: 29 mEq/L (ref 19–32)
Calcium: 9.4 mg/dL (ref 8.4–10.5)
Chloride: 104 mEq/L (ref 96–112)
GFR calc Af Amer: >60 mL/min (ref >60)
Sodium: 139 mEq/L (ref 135–145)

## 2010-11-28 LAB — GLUCOSE, CAPILLARY
Glucose-Capillary: 115 mg/dL — ABNORMAL HIGH (ref 70–99)
Glucose-Capillary: 180 mg/dL — ABNORMAL HIGH (ref 70–99)
Glucose-Capillary: 108 mg/dL — ABNORMAL HIGH (ref 70–99)

## 2010-11-29 ENCOUNTER — Other Ambulatory Visit: Payer: Self-pay | Admitting: Internal Medicine

## 2010-11-29 DIAGNOSIS — K859 Acute pancreatitis without necrosis or infection, unspecified: Secondary | ICD-10-CM

## 2010-11-29 LAB — BASIC METABOLIC PANEL
BUN: <3 mg/dL — ABNORMAL LOW (ref 6–23)
Calcium: 10 mg/dL (ref 8.4–10.5)
Creatinine, Ser: 0.56 mg/dL (ref 0.50–1.10)
GFR calc Af Amer: >60 mL/min (ref >60)
GFR calc non Af Amer: >60 mL/min (ref >60)
Glucose, Bld: 128 mg/dL — ABNORMAL HIGH (ref 70–99)
Sodium: 142 mEq/L (ref 135–145)

## 2010-11-29 LAB — GLUCOSE, CAPILLARY
Glucose-Capillary: 79 mg/dL (ref 70–99)
Glucose-Capillary: 187 mg/dL — ABNORMAL HIGH (ref 70–99)

## 2010-11-30 LAB — GLUCOSE, CAPILLARY
Glucose-Capillary: 147 mg/dL — ABNORMAL HIGH (ref 70–99)
Glucose-Capillary: 135 mg/dL — ABNORMAL HIGH (ref 70–99)

## 2010-12-01 ENCOUNTER — Encounter: Payer: BC Managed Care – PPO | Admitting: Internal Medicine

## 2010-12-01 NOTE — Discharge Summary (Addendum)
[name], [name] NO.:  [id number]  MEDICAL RECORD NO.:  [id number]  LOCATION:  [facility]                         FACILITY:  [facility]  PHYSICIAN:  Blanch Media, M.D.DATE OF BIRTH:  [birth year]  DATE OF ADMISSION:  11/22/2010 DATE OF DISCHARGE:  11/30/2010                              DISCHARGE SUMMARY   ATTENDING PHYSICIAN:  Blanch Media, MD  DISCHARGE DIAGNOSES: 1. Acute pancreatitis secondary to pancreas divisum. 2. Diabetes mellitus. 3. Pain and misuse of narcotics with frequent refills. 4. Hypertension.   DISCHARGE MEDICATIONS:    NEW MEDICATION: 1. Docusate sodium 100 mg by mouth twice a day, taken for as long as     she is taking narcotics. 2. Hydrocodone/acetaminophen 5/325 mg 1-2 tablets by mouth as needed     for pain every 4 hours.  She is to take these for 2 days.  She was     dispensed a total of 13 tablets, which is just enough to allow her     to reach for followup appointment with Dr. Thad Ranger. 3. Morphine 15 mg sustained release.  She is to take 15 mg by mouth     twice daily as needed.  She was given enough for 2 days worth just     enough to get her to her appointment with Dr. Thad Ranger.  She was     dispensed a total number of 4 tablets. 4. Ondansetron 4 mg tablets.  She is to take 4 mg by mouth 3 times a     day 30 minutes before meals to prevent nausea and vomiting. 5.  Pancrelipase 2 capsules by mouth 3 times a day taken 5 minutes before meals.       She is to take 1 caplet before eating a snack.  The following are continued home medications: 1. Amlodipine 10 mg taken 1 tablet by mouth daily. 2. Lisinopril 20 mg 1 tablet by mouth daily. 3. Metformin 1000 mg 1 tablet by mouth twice daily. 4. Tums OTC 2 tablets by mouth daily as needed.  DISPOSITION AND FOLLOWUP:  [name] was discharged from [facility] on November 30, 2010, in stable and improved condition.  She had no fever, decreased nausea with food, and was  able to tolerate eating oatmeal with  little difficulty.  She was also able to tolerate some other types of soft bland foods with only slight nausea.  Her pain was well controlled on oral pain medications.  She has a followup appointment with     her gastroenterologist, Dr. Allyson Sabal at [facility] on     January 17, 2011.  This is a preexisting appointment.  She has an appointment with her primary care physician, Dr. Thad Ranger on Thursday, December 02, 2010, at 4:15 p.m.  FOLLOWUP VISITS:  The following items should be checked. 1. Please check BMET, given electrolyte abnormalities during the     hospital admission, most significantly hypokalemia. 2. The patient will need narcotic contract signed during clinic. 3. Please followup on resolution of pancreatitis, pain, and ability     to maintain p.o. intake. 4. Hypertension.  Please monitor blood pressure and titrate     antihypertensive as necessary.  CONSULTATIONS:  None.  PROCEDURES PERFORMED:  None .  ADMISSION HISTORY:  [name] is a 55 year old woman with history of pancreas divisum and repeated episodes of acute pancreatitis.  She was admitted after 1 day of 10/10 sharp abdominal pain with vomiting and more than 10 episodes of diarrhea.  She reports not eating for the past 2 days, taking only jello and a few crackers.  She states she has had increased nausea with eating and increased pain with any food or drink of normal size.  She is followed at [facility] by Dr. Allyson Sabal, and has had multiple stents placed for pancreas divisum.  She reports having had great improvements with the stent placement and desires another stent to be placed.  She reports not taking her pancrelipase since it did not provide noticeable improvement in symptoms.  She denies use of any pain medications on the first questioning, however, during the course of her admission it was revealed that she is in fact taking chronic  pain medications "on and off. "  She sought medical care as she had been hypoglycemic on previous admissions, and given her lack of p.o. intake decided to come into the hospital.  ADMISSION PHYSICAL EXAMINATION:  VITAL SIGNS:  Temperature 97.9, pulse 65, respirations 16, blood pressure 159/85, O2 sat 100% on room air. GENERAL:  Middle-aged woman resting in bed.  Younger than stated age. HEENT:  PERRL, EOMI, no scleral icterus. CARDIAC:  Regular rate and rhythm.  No rubs, murmurs, or gallops. PULMONARY:  Clear to auscultation bilaterally.  Moving normal volumes of air. ABDOMEN:  Soft.  Tenderness to light palpation in the epigastric area and tenderness in the left upper quadrant as well.  Mild distention present.  Bowel sounds present. EXTREMITIES:  Warm and well perfused.  No pedal edema. NEURO:  Alert and oriented x3.  Cranial nerves II through XII grossly intact.  Strength and sensation to light touch equal in bilateral upper and lower extremities.  LABORATORY DATA:  Urinalysis was clear.  CBC, white blood cell count 8.7, hemoglobin 13.8, hematocrit 39.1, platelets 367.  Sodium 139, potassium 2.7, chloride 103, BUN 8, creatinine 0.6, bicarbonate 25, glucose was 157.  Ionized calcium was 1.23.  Hepatic functional panel, total protein was 8.3, albumin is 4.4, AST was 15, ALT was 12 alk phos was 89, total bilirubin 0.4, direct bilirubin 0.1, lipase was elevated at 84.  EKG showed no previous changes from prior EKG.  HOSPITAL COURSE: 1. Acute pancreatitis.  [name] was placed n.p.o. and pain     control was managed with IV morphine.  She was given Zofran     IV as pain control was only every 2 hours on IV morphine.       PCA was started on November 25, 2010.  On     November 28, 2010, the PCA was discontinued as the patient was feeling     much better.  During this time, [name] endorsed a desire to     advance her diet.  She started taking clears with some increase in     her  pain.  She was started on a bland diet after successfully     transitioning to clears with a Glucerna shakes.  However, on November 29, 2010, she states her pain was greatly increased with soft diet     and required another day in the hospital.  She was given oatmeal on     the  morning of November 30, 2010, which she was able to tolerate     very well.  She states premedication with pancreatic enzymes and     Zofran helped her very much.  She still endorsed some trouble     eating rich fatty foods.  However, she states she could eat     oatmeal until she felt better.  She desired to be discharged to home on     November 30, 2010.  Given her ability to take p.o. medications for pain     control and ability to eat on her own, she was discharged. 2. Diabetes.  [name] had increased blood sugar as she was given     D5 saline while n.p.o.  This was managed with sliding scale insulin     without meal coverage as she had her meals.  As [name] was     transitioned to p.o. diet, she was placed on sliding scale insulin     with moderate meal coverage.  On discharge, she was placed back on     her home dose of metformin with the instruction not to take it if     she was not eating. 3. Pain and narcotic use.  During [name]'s admission, there was     slight concern for inappropriate use of narcotics, and thus we     searched the narcotics data base.  THis  showed more frequent (approximately     every 18-20 days) refills of her month long supply of Vicodin.  For     this reason, it was discussed with [name] that she will need     to sign a pain contract in the [facility] if she wishes to continue taking narcotics bthat are     prescribed from the [facility].  This will be performed at her followup     appointment on December 02, 2010. 4. Hypertension.  [name] was hypertensive on admission.     However, after she was placed n.p.o. and her pain was  well     controlled, her blood pressure dropped into the 100s/60s.  Her     lisinopril was held and as she remained n.p.o. for number of days,     eventually her amlodipine was held.  These were gradually increased     as pain was better managed, and the patient started eating regular     food again.  She was discharged on her home dose of lisinopril and     amlodipine. 5. Hypokalemia.  On November 29, 2010, of [name] was slightly     hypokalemia.  This was supplemented with 40 mg of p.o. potassium 1     time.  DISCHARGE VITALS:  Temperature 98.3, pulse is 65, blood pressure 123/77, respirations 18, O2 sat 99% on room air.  DISCHARGE LABS:  White blood cell count 6.7, hemoglobin 12.2, hematocrit 35.0, platelets 392.  Sodium was 142, potassium was 3.3, chloride was 106, bicarb was 27, BUN was less than 3, creatinine was 0.56, glucose was 128, calcium was 10.0.    ______________________________ Quentin Ore, MD   ______________________________ Blanch Media, M.D.    MR/MEDQ  D:  11/30/2010  T:  12/01/2010  Job:  [id number]  cc:   Dr. Allyson Sabal Dr. Rande Lawman  Electronically Signed by Quentin Ore MD on 12/01/2010 05:50:25 PM Electronically Signed by Blanch Media M.D. on 12/02/2010 10:28:35 AM

## 2010-12-02 ENCOUNTER — Ambulatory Visit (INDEPENDENT_AMBULATORY_CARE_PROVIDER_SITE_OTHER): Payer: BC Managed Care – PPO | Admitting: Internal Medicine

## 2010-12-02 ENCOUNTER — Encounter: Payer: Self-pay | Admitting: Internal Medicine

## 2010-12-02 VITALS — BP 151/93 | HR 84 | Temp 99.2°F | Ht 60.0 in | Wt 114.7 lb

## 2010-12-02 DIAGNOSIS — K859 Acute pancreatitis without necrosis or infection, unspecified: Secondary | ICD-10-CM

## 2010-12-02 DIAGNOSIS — E119 Type 2 diabetes mellitus without complications: Secondary | ICD-10-CM

## 2010-12-02 DIAGNOSIS — Q453 Other congenital malformations of pancreas and pancreatic duct: Secondary | ICD-10-CM

## 2010-12-02 DIAGNOSIS — E785 Hyperlipidemia, unspecified: Secondary | ICD-10-CM

## 2010-12-02 DIAGNOSIS — I10 Essential (primary) hypertension: Secondary | ICD-10-CM

## 2010-12-02 LAB — BASIC METABOLIC PANEL
Calcium: 10.2 mg/dL (ref 8.4–10.5)
Creat: 0.6 mg/dL (ref 0.50–1.10)
Sodium: 140 mEq/L (ref 135–145)

## 2010-12-02 LAB — GLUCOSE, CAPILLARY: Glucose-Capillary: 104 mg/dL — ABNORMAL HIGH (ref 70–99)

## 2010-12-02 MED ORDER — HYDROCODONE-ACETAMINOPHEN 5-325 MG PO TABS: 1.0000 | ORAL_TABLET | Freq: Three times a day (TID) | ORAL | Status: DC | PRN

## 2010-12-02 MED ORDER — PRAVASTATIN SODIUM 40 MG PO TABS: 40.0000 mg | ORAL_TABLET | Freq: Every evening | ORAL | Status: DC

## 2010-12-02 NOTE — Assessment & Plan Note (Signed)
Liver Function Tests:    Component Value Date/Time   AST 15 11/22/2010 1722   ALT 12 11/22/2010 1722   ALKPHOS 89 11/22/2010 1722   BILITOT 0.4 11/22/2010 1722   PROT 8.3 11/22/2010 1722   ALBUMIN 4.4 11/22/2010 1722    Lipid Panel:     Component Value Date/Time   CHOL 203* 11/24/2010 0500   TRIG 115 11/24/2010 0500   HDL 70 11/24/2010 0500   CHOLHDL 2.9 11/24/2010 0500   VLDL 23 11/24/2010 0500   LDLCALC 110* 11/24/2010 0500     Assessment: Lipid control:    mildly elevated  Progress toward goals:   deteriorated Barriers to meeting goals:  no barriers identified  Plan: Lipid treatment:   - Will start pravastatin, the patient only has mild elevation of her LDL.  - The patient has been explained of the possible side effects of this medication. - Recheck liver function tests in 6-8 weeks after initiating treatment.

## 2010-12-02 NOTE — Assessment & Plan Note (Signed)
BP Readings from Last 3 Encounters:  12/02/10 151/93  10/08/10 145/89  08/25/10 136/92    Basic Metabolic Panel:    Component Value Date/Time   NA 140 12/02/2010 1701   K 3.6 12/02/2010 1701   CL 101 12/02/2010 1701   CO2 24 12/02/2010 1701   BUN 7 12/02/2010 1701   CREATININE 0.60 12/02/2010 1701   CREATININE 0.56 11/29/2010 0505   GLUCOSE 105* 12/02/2010 1701   CALCIUM 10.2 12/02/2010 1701    Assessment: Hypertension control:  moderately elevated  Progress toward goals:  deteriorated Barriers to meeting goals:  usually having assessment when she is in acute pain.  Plan: Hypertension treatment:  continue current medications, consider escalation of Amlodipine if continued elevation on reeval when the pt is not in acute pain.

## 2010-12-02 NOTE — Progress Notes (Addendum)
Subjective:    Patient ID: [name], female    DOB: 1955-10-29, 55 y.o.   MRN: [id number]  HPI Pt is a 55 y.o. female who has a past medical history of HTN; HLD; Sickle cell trait; Migraine headache; Pulmonary embolus (2006); Anemia; Pancreatic divisum; Diabetes mellitus (2010); Seizure disorder; History of migraine headaches; Sickle cell trait; Beta thalassemia; and CVA (cerebral infarction) (1993). and presents to clinic today for the following:  1) HFU - Patient was hospitalized at [facility] from 7/23-7/31/2012 for evaluation of acute abdominal pain, which was determined to be secondary to acute pancreatitis. Was treated initially with NPO status, then transitioned to bland foods which she tolerated in the hospital. Since hospital discharge, patient notes no change of symptoms. Is not taking recommended medications regularly. Diet now includes fluids, oatmeal, glucerna. Continues to have abdominal and back pain, is not vomiting, no fevers, no chills. During the hospital course, there was concern that this patient has been requesting early refills of her pain medications, and therefore, she was only discharged with a 2 day course of narcotics, until follow-up with me today.   2) HTN - Patient does not check blood pressure regularly at home. Currently taking Amlodipine 5mg  and Lisinopril 20mg . Denies headaches, dizziness, lightheadedness, chest pain, shortness of breath.  3) HLD - Currently not on medication. Denies chest pain, difficulty breathing, palpitations, tachycardia, and muscle pains.      Review of Systems Per HPI.  Current Outpatient Medications Medication Sig  . amLODipine (NORVASC) 5 MG tablet Take 1 tablet (5 mg total) by mouth daily.  . calcium carbonate (TUMS - DOSED IN MG ELEMENTAL CALCIUM) 500 MG chewable tablet Chew 1 tablet by mouth 2 (two) times daily as needed.    Marland Kitchen HYDROcodone-acetaminophen (NORCO) 5-325 MG per tablet Take 1 tablet by mouth every 8 (eight) hours as  needed.  Marland Kitchen lisinopril (PRINIVIL,ZESTRIL) 20 MG tablet Take 1 tablet (20 mg total) by mouth daily.  Marland Kitchen loperamide (IMODIUM A-D) 2 MG tablet Take 2 mg by mouth as needed. For diarrhea - not more than 6 tablets a day   . metFORMIN (GLUCOPHAGE) 1000 MG tablet Take 1 tablet (1,000 mg total) by mouth 2 (two) times daily with a meal.  . ondansetron (ZOFRAN) 4 MG tablet Take 4 mg by mouth Every 6 hours as needed.  . Pancrelipase, Lip-Prot-Amyl, 6000 UNITS CPEP Take 2 capsules by mouth 3 (three) times daily with meals. Take 2 capsules three time per day with meals and take 1 capsule with every snack.   . pravastatin (PRAVACHOL) 40 MG tablet Take 1 tablet (40 mg total) by mouth every evening.    Allergies Butalbital-apap-caffeine; Shrimp flavor; and Sulfonamide derivatives  Past Medical History  Diagnosis Date  . Hypertension   . HLD (hyperlipidemia) 2009  . Sickle cell trait   . Migraine headache   . Pulmonary embolus 2006    08/2004 -   Two areas of V/Q mismatch. Findings compatible  with high probability for pulmonary embolus.; pt was on coumadin for 1 year  . Anemia     beta thallasemia  . Pancreatic divisum     S/P ERCP with stenting 07/2010 at [facility], then stent removal (08/05/2010)  . Diabetes mellitus 2010    well controlled  . Seizure disorder   . History of migraine headaches   . Sickle cell trait   . Beta thalassemia   . CVA (cerebral infarction) 1993    Per report by patient she had a stroke and  prolonged rehab course eventually resolving her right sided weakness, as per her hx obtained 08/2007    Past Surgical History  Procedure Date  . Abdominal hysterectomy age 81 yo    2/2 endometriosis  . Pancreatic stent placement/removal     2011 and 2012       Objective:   Physical Exam General: Vital signs reviewed and noted. Well-developed, well-nourished, in mild acute distress due to pain; alert, appropriate and cooperative throughout examination.  Head: Normocephalic,  atraumatic.  Neck: No deformities, masses, or tenderness noted.  Lungs:  Normal respiratory effort. Clear to auscultation BL without crackles or wheezes.  Heart: RRR. S1 and S2 normal without gallop, murmur, or rubs.  Abdomen:  BS normoactive. Soft, Nondistended, left upper quadrant abdominal pain. No masses or organomegaly.  Extremities: No pretibial edema.        Assessment & Plan:  Case and plan of care discussed with Dr. Doneen Poisson.

## 2010-12-02 NOTE — Assessment & Plan Note (Signed)
Patient was recently hospitalized for acute pancreatitis in the setting of her pancreatic divisum. This time, it is too soon to see complete resolution of her symptoms that she was having on discharge on 11/30/2010. However the patient does note that she is having stabilization of her symptoms, and is able to eat oatmeal, intake fluids with only minimal nausea which is controlled with Zofran. - Will check been on today to evaluate electrolytes, as the patient had mild hypokalemia during hospital course the - Commended to continue with her and pancreatic enzyme supplementation - We filled out a pain contract today. I do not want the patient to be chronically on narcotics, instead she is to use these medications and request these medications only during acute exacerbations of her pancreatitis. The patient is agreeable to these stipulations.

## 2010-12-02 NOTE — Patient Instructions (Signed)
   Please follow-up at the clinic in 1 month, at which time we will reevaluate your blood pressure and abdominal pain  Your cholesterol medication has been sent to your pharmacy.  If you have been started on new medication(s), and you develop throat closing, tongue swelling, rash, please stop the medication and call the clinic at [phone] and go to the ER.  If you are diabetic, please bring your meter to your next visit.  If symptoms worsen, or new symptoms arise, please call the clinic or go to the ER.  Please bring all of your medications in a bag to your next visit.

## 2010-12-09 ENCOUNTER — Telehealth: Payer: Self-pay | Admitting: Dietician

## 2010-12-09 NOTE — Telephone Encounter (Signed)
Called patient concerning hospital follow up. She denies any needs at this time. Agreed to schedule appointment with CDE same day as next doctor appointment. Starting work Advertising account executive. Eating limited diet due to pain. Encouraged her to eat lowfat foods and carry foods with her to work.  Will request front office schedule CDE appointment.

## 2010-12-29 ENCOUNTER — Encounter: Payer: BC Managed Care – PPO | Admitting: Internal Medicine

## 2010-12-30 ENCOUNTER — Encounter: Payer: BC Managed Care – PPO | Admitting: Internal Medicine

## 2010-12-30 ENCOUNTER — Encounter: Payer: Self-pay | Admitting: Internal Medicine

## 2010-12-30 ENCOUNTER — Ambulatory Visit (INDEPENDENT_AMBULATORY_CARE_PROVIDER_SITE_OTHER): Payer: BC Managed Care – PPO | Admitting: Internal Medicine

## 2010-12-30 VITALS — BP 141/81 | HR 78 | Temp 98.4°F | Ht 60.0 in | Wt 112.2 lb

## 2010-12-30 DIAGNOSIS — I10 Essential (primary) hypertension: Secondary | ICD-10-CM

## 2010-12-30 DIAGNOSIS — K859 Acute pancreatitis without necrosis or infection, unspecified: Secondary | ICD-10-CM

## 2010-12-30 DIAGNOSIS — Z0000 Encounter for general adult medical examination without abnormal findings: Secondary | ICD-10-CM

## 2010-12-30 DIAGNOSIS — Q453 Other congenital malformations of pancreas and pancreatic duct: Secondary | ICD-10-CM

## 2010-12-30 MED ORDER — HYDROCODONE-ACETAMINOPHEN 5-325 MG PO TABS: 1.0000 | ORAL_TABLET | Freq: Three times a day (TID) | ORAL | Status: DC | PRN

## 2010-12-30 MED ORDER — PROMETHAZINE HCL 25 MG PO TABS: 25.0000 mg | ORAL_TABLET | Freq: Three times a day (TID) | ORAL | Status: DC | PRN

## 2010-12-30 NOTE — Progress Notes (Signed)
  Subjective:    Patient ID: [name], female    DOB: 19-Aug-1955, 55 y.o.   MRN: [id number]  HPI: 55 year old woman with past medical history significant for pancreatitis secondary to pancreatic divisum, hypertension comes to the clinic for a followup visit.  She still complains of abdominal pain, located in the epigastrium, rates her pain fight out of 10. She states that she has been eating soft diet-Jell-O's, oatmeal etc which she can tolerate.She states taking her enzymes regularly that helps her. Denies any associated nausea or vomiting or fever.  She is requesting for pain and nausea meds.      Review of Systems  Constitutional: Negative for fever, chills, diaphoresis and fatigue.  HENT: Negative for nosebleeds, congestion, rhinorrhea, sneezing, postnasal drip and tinnitus.   Eyes: Negative for visual disturbance.  Respiratory: Negative for apnea, cough, choking, chest tightness, shortness of breath, wheezing and stridor.   Cardiovascular: Negative for chest pain, palpitations and leg swelling.  Gastrointestinal: Positive for nausea and abdominal pain.  Genitourinary: Negative for dysuria, urgency, frequency, flank pain and decreased urine volume.  Musculoskeletal: Negative for arthralgias.  Neurological: Negative for headaches.  Hematological: Negative for adenopathy.       Objective:   Physical Exam  Constitutional: She appears well-developed and well-nourished. No distress.  HENT:  Head: Normocephalic and atraumatic.  Mouth/Throat: No oropharyngeal exudate.  Eyes: Conjunctivae and EOM are normal. Pupils are equal, round, and reactive to light.  Neck: Normal range of motion. Neck supple. No JVD present. No tracheal deviation present. No thyromegaly present.  Cardiovascular: Normal rate, regular rhythm, normal heart sounds and intact distal pulses.  Exam reveals no gallop and no friction rub.   No murmur heard. Pulmonary/Chest: Effort normal and breath sounds normal.  No stridor. No respiratory distress. She has no wheezes. She has no rales. She exhibits no tenderness.  Abdominal: Soft. Bowel sounds are normal. She exhibits no distension and no mass. There is no rebound and no guarding.       Tender to palpation in the epigastrium.  Musculoskeletal: Normal range of motion. She exhibits no edema and no tenderness.  Lymphadenopathy:    She has no cervical adenopathy.  Skin: She is not diaphoretic.          Assessment & Plan:

## 2010-12-30 NOTE — Assessment & Plan Note (Addendum)
She still complains of abdominal pain associated with nausea but is getting better. States that she has been taking her enzymes and is trying to eat what she can tolerate. She signed the pain contract with our clinic and was requesting refills. The contract was renewed and per Dr. Ferne Coe- we need to give her meds only with acute excac. She seemed to be in mild discomfort today.  -Will refill her pain med's. Will get A UDS ( she states that she took her pain meds this AM). - Symptomatic treatment of nausea with phenergan as Zofran is expensive for her.

## 2010-12-30 NOTE — Assessment & Plan Note (Signed)
She refused flu shot today.

## 2010-12-30 NOTE — Assessment & Plan Note (Signed)
Lab Results  Component Value Date   NA 140 12/02/2010   K 3.6 12/02/2010   CL 101 12/02/2010   CO2 24 12/02/2010   BUN 7 12/02/2010   CREATININE 0.60 12/02/2010   CREATININE 0.56 11/29/2010    BP Readings from Last 3 Encounters:  12/30/10 141/81  12/02/10 151/93  10/08/10 145/89    Assessment: Hypertension control:  controlled  Barriers to meeting goals:  no barriers identified  Plan: Hypertension treatment:  continue current medications

## 2010-12-30 NOTE — Patient Instructions (Addendum)
Please take your medicines as prescribed. Please schedule a follow up appointment in 1 month with your PCP. Please bring your medication bottles with your next clinic appointment.

## 2010-12-31 LAB — DRUGS OF ABUSE SCREEN W/O ALC, ROUTINE URINE
Amphetamine Screen, Ur: NEGATIVE
Benzodiazepines.: NEGATIVE
Cocaine Metabolites: NEGATIVE
Marijuana Metabolite: NEGATIVE
Methadone: NEGATIVE

## 2011-01-12 ENCOUNTER — Emergency Department (HOSPITAL_COMMUNITY)
Admission: EM | Admit: 2011-01-12 | Discharge: 2011-01-12 | Disposition: A | Discharge status: Home / Self Care | Payer: BC Managed Care – PPO | Attending: Emergency Medicine | Admitting: Emergency Medicine

## 2011-01-12 DIAGNOSIS — Z79899 Other long term (current) drug therapy: Secondary | ICD-10-CM | POA: Insufficient documentation

## 2011-01-12 DIAGNOSIS — Z86718 Personal history of other venous thrombosis and embolism: Secondary | ICD-10-CM | POA: Insufficient documentation

## 2011-01-12 DIAGNOSIS — D561 Beta thalassemia: Secondary | ICD-10-CM | POA: Insufficient documentation

## 2011-01-12 DIAGNOSIS — R197 Diarrhea, unspecified: Secondary | ICD-10-CM | POA: Insufficient documentation

## 2011-01-12 DIAGNOSIS — Z8673 Personal history of transient ischemic attack (TIA), and cerebral infarction without residual deficits: Secondary | ICD-10-CM | POA: Insufficient documentation

## 2011-01-12 DIAGNOSIS — Z8542 Personal history of malignant neoplasm of other parts of uterus: Secondary | ICD-10-CM | POA: Insufficient documentation

## 2011-01-12 DIAGNOSIS — E119 Type 2 diabetes mellitus without complications: Secondary | ICD-10-CM | POA: Insufficient documentation

## 2011-01-12 DIAGNOSIS — I10 Essential (primary) hypertension: Secondary | ICD-10-CM | POA: Insufficient documentation

## 2011-01-12 DIAGNOSIS — R1013 Epigastric pain: Secondary | ICD-10-CM | POA: Insufficient documentation

## 2011-01-12 DIAGNOSIS — R110 Nausea: Secondary | ICD-10-CM | POA: Insufficient documentation

## 2011-01-12 LAB — DIFFERENTIAL
Basophils Relative: 0 % (ref 0–1)
Eosinophils Absolute: 0.1 10*3/uL (ref 0.0–0.7)
Monocytes Absolute: 0.6 10*3/uL (ref 0.1–1.0)
Monocytes Relative: 9 % (ref 3–12)

## 2011-01-12 LAB — COMPREHENSIVE METABOLIC PANEL
ALT: 11 U/L (ref 0–35)
AST: 19 U/L (ref 0–37)
Calcium: 9.8 mg/dL (ref 8.4–10.5)
Creatinine, Ser: 0.55 mg/dL (ref 0.50–1.10)
GFR calc Af Amer: >60 mL/min (ref >60)
Glucose, Bld: 180 mg/dL — ABNORMAL HIGH (ref 70–99)
Sodium: 139 mEq/L (ref 135–145)
Total Protein: 7.9 g/dL (ref 6.0–8.3)

## 2011-01-12 LAB — CBC
MCH: 26.6 pg (ref 26.0–34.0)
MCHC: 34.1 g/dL (ref 30.0–36.0)
Platelets: 371 10*3/uL (ref 150–400)

## 2011-01-12 LAB — URINALYSIS, ROUTINE W REFLEX MICROSCOPIC
Hgb urine dipstick: NEGATIVE
Leukocytes, UA: NEGATIVE
Specific Gravity, Urine: <1.005 — ABNORMAL LOW (ref 1.005–1.030)
Urobilinogen, UA: 0.2 mg/dL (ref 0.0–1.0)

## 2011-01-13 ENCOUNTER — Encounter: Payer: Self-pay | Admitting: Internal Medicine

## 2011-01-13 NOTE — Progress Notes (Signed)
Pt no-showed to her Eye appt in 12/2010 and 10/2010. Will need to reeval and follow-up annual diabetic eye exam with referral back to [facility] eye care at next visit.  Johnette Abraham, D.O.

## 2011-01-24 ENCOUNTER — Ambulatory Visit (INDEPENDENT_AMBULATORY_CARE_PROVIDER_SITE_OTHER): Payer: BC Managed Care – PPO | Admitting: Internal Medicine

## 2011-01-24 ENCOUNTER — Encounter: Payer: Self-pay | Admitting: Internal Medicine

## 2011-01-24 VITALS — BP 134/88 | HR 86 | Temp 98.4°F | Ht 60.0 in | Wt 112.2 lb

## 2011-01-24 DIAGNOSIS — E119 Type 2 diabetes mellitus without complications: Secondary | ICD-10-CM

## 2011-01-24 DIAGNOSIS — I10 Essential (primary) hypertension: Secondary | ICD-10-CM

## 2011-01-24 DIAGNOSIS — Q453 Other congenital malformations of pancreas and pancreatic duct: Secondary | ICD-10-CM

## 2011-01-24 LAB — GLUCOSE, CAPILLARY: Glucose-Capillary: 95 mg/dL (ref 70–99)

## 2011-01-24 LAB — POCT GLYCOSYLATED HEMOGLOBIN (HGB A1C): Hemoglobin A1C: 6.9

## 2011-01-24 MED ORDER — PROMETHAZINE HCL 25 MG PO TABS: 25.0000 mg | ORAL_TABLET | Freq: Three times a day (TID) | ORAL | Status: DC | PRN

## 2011-01-24 MED ORDER — HYDROCODONE-ACETAMINOPHEN 5-325 MG PO TABS: 1.0000 | ORAL_TABLET | Freq: Three times a day (TID) | ORAL | Status: DC | PRN

## 2011-01-24 MED ORDER — PANCRELIPASE (LIP-PROT-AMYL) 12000-38000 UNITS PO CPEP: 2.0000 | ORAL_CAPSULE | Freq: Three times a day (TID) | ORAL | Status: DC

## 2011-01-24 NOTE — Progress Notes (Signed)
Subjective:   Patient ID: [name] female   DOB: 08-15-1955 55 y.o.   MRN: [id number]  HPI: [name] is a 55 y.o. woman who presents to clinic today complaining of increasing abdominal pain for the last 2 weeks or so.  She states that she has been taking her enzymes about 30 minutes before eating and then about 45 minutes after eating she gets nausea, vomiting, diarrhea, and abdominal pain.  Her phenergan helps some with the nausea  But since thursday nothing has helped and she hasn't eaten anything except jello or crackers. She is followed by GI at [facility] and saw then last a few months ago.   Last saw GI at [facility] a few months ago.  She is supposed to follow up in September.  She has been using her Hydrocodone/APAP as often as every 4 hours for the last week because of the increased pain which does help some.  She denies greasy, foul smelling stools.   She states that she has ben taking her other medications as directed.  She did not bring her meter with her today but states that her blood sugar looks good at home usually with values in the 100-200s.    Past Medical History  Diagnosis Date  . Hypertension   . HLD (hyperlipidemia) 2009  . Sickle cell trait   . Migraine headache   . Pulmonary embolus 2006    08/2004 -   Two areas of V/Q mismatch. Findings compatible  with high probability for pulmonary embolus.; pt was on coumadin for 1 year  . Anemia     beta thallasemia  . Pancreatic divisum     S/P ERCP with stenting 07/2010 at [facility], then stent removal (08/05/2010)  . Diabetes mellitus 2010    well controlled  . Seizure disorder   . History of migraine headaches   . Sickle cell trait   . Beta thalassemia   . CVA (cerebral infarction) 1993    Per report by patient she had a stroke and prolonged rehab course eventually resolving her right sided weakness, as per her hx obtained 08/2007   Current Outpatient Prescriptions  Medication Sig Dispense Refill  . amLODipine  (NORVASC) 5 MG tablet Take 1 tablet (5 mg total) by mouth daily.  90 tablet  2  . calcium carbonate (TUMS - DOSED IN MG ELEMENTAL CALCIUM) 500 MG chewable tablet Chew 1 tablet by mouth 2 (two) times daily as needed.        Marland Kitchen HYDROcodone-acetaminophen (NORCO) 5-325 MG per tablet Take 1 tablet by mouth every 8 (eight) hours as needed.  90 tablet  0  . lisinopril (PRINIVIL,ZESTRIL) 20 MG tablet Take 1 tablet (20 mg total) by mouth daily.  30 tablet  5  . loperamide (IMODIUM A-D) 2 MG tablet Take 2 mg by mouth as needed. For diarrhea - not more than 6 tablets a day       . metFORMIN (GLUCOPHAGE) 1000 MG tablet Take 1 tablet (1,000 mg total) by mouth 2 (two) times daily with a meal.  180 tablet  4  . ondansetron (ZOFRAN) 4 MG tablet Take 4 mg by mouth Every 6 hours as needed.      . Pancrelipase, Lip-Prot-Amyl, 6000 UNITS CPEP Take 2 capsules by mouth 3 (three) times daily with meals. Take 2 capsules three time per day with meals and take 1 capsule with every snack.       . pravastatin (PRAVACHOL) 40 MG tablet Take 1 tablet (40 mg  total) by mouth every evening.  30 tablet  2  . promethazine (PHENERGAN) 25 MG tablet Take 1 tablet (25 mg total) by mouth every 8 (eight) hours as needed.  30 tablet  1   Family History  Problem Relation Age of Onset  . Prostate cancer Father   . Sickle cell anemia Brother   . Lung cancer Maternal Aunt   . Lung cancer Maternal Uncle   . Breast cancer Maternal Grandmother    History   Social History  . Marital Status: Married    Spouse Name: N/A    Number of Children: N/A  . Years of Education: N/A   Social History Main Topics  . Smoking status: Former Smoker -- 0.5 packs/day for 30 years    Quit date: 05/02/2000  . Smokeless tobacco: None  . Alcohol Use: No  . Drug Use: No  . Sexually Active: Yes   Other Topics Concern  . None   Social History Narrative   Works at [employer] as a Merchandiser, retail, on Health visitor about 9 hours/ day   Review of  Systems: Constitutional: Denies fever, chills, diaphoresis, appetite change and fatigue.  HEENT: Denies photophobia, eye pain, redness, hearing loss, ear pain, congestion, sore throat, rhinorrhea, sneezing, mouth sores, trouble swallowing, neck pain, neck stiffness and tinnitus.   Respiratory: Denies SOB, DOE, cough, chest tightness,  and wheezing.   Cardiovascular: Denies chest pain, palpitations and leg swelling.  Gastrointestinal: Denies nausea, vomiting, abdominal pain, diarrhea, constipation, blood in stool and abdominal distention.  Genitourinary: Denies dysuria, urgency, frequency, hematuria, flank pain and difficulty urinating.  Musculoskeletal: Denies myalgias, back pain, joint swelling, arthralgias and gait problem.  Skin: Denies pallor, rash and wound.  Neurological: Denies dizziness, seizures, syncope, weakness, light-headedness, numbness and headaches.  Hematological: Denies adenopathy. Easy bruising, personal or family bleeding history  Psychiatric/Behavioral: Denies suicidal ideation, mood changes, confusion, nervousness, sleep disturbance and agitation  Objective:  Physical Exam: Filed Vitals:   01/24/11 1321  BP: 134/88  Pulse: 86  Temp: 98.4 F (36.9 C)  TempSrc: Oral  Height: 5' (1.524 m)  Weight: 112 lb 3.2 oz (50.894 kg)   Constitutional: Vital signs reviewed.  Patient is a thin, well-developed, and well-nourished woman in no acute distress and cooperative with exam. Alert and oriented x3.  Head: Normocephalic and atraumatic Ear: TM normal bilaterally Mouth: no erythema or exudates, MMM Eyes: PERRL, EOMI, conjunctivae normal, No scleral icterus.  Neck: Supple, Trachea midline normal ROM, No JVD, mass, thyromegaly, or carotid bruit present.  Cardiovascular: RRR, S1 normal, S2 normal, no MRG, pulses symmetric and intact bilaterally Pulmonary/Chest: CTAB, no wheezes, rales, or rhonchi Abdominal: Soft. Mild epigastric tenderness in the epigastric and RUQ,  non-distended, bowel sounds are normal, no masses, organomegaly, or guarding present.  GU: no CVA tenderness Musculoskeletal: No joint deformities, erythema, or stiffness, ROM full and no nontender Hematology: no cervical, inginal, or axillary adenopathy.  Neurological: A&O x3, Strength is normal and symmetric bilaterally, cranial nerve II-XII are grossly intact, no focal motor deficit, sensory intact to light touch bilaterally.  Skin: Warm, dry and intact. No rash, cyanosis, or clubbing.  Psychiatric: Normal mood and mildly anxious affect. speech and behavior is normal. Judgment and thought content normal. Cognition and memory are normal.   Assessment & Plan:

## 2011-01-24 NOTE — Patient Instructions (Addendum)
Acute Pancreatitis The pancreas is a large gland located behind your stomach. It produces (secretes) enzymes. These enzymes help digest food. It also releases the hormones glucagon and insulin. These hormones help regulate blood sugar. When the pancreas becomes inflamed, the disease is called pancreatitis. Inflammation of the pancreas occurs when enzymes from the pancreas begin attacking and digesting the pancreas. CAUSES Most cases of sudden onset (acute) pancreatitis are caused by:  Alcohol abuse.   Gallstones.  Other less common causes are:  Some medications.   Exposure to certain chemicals   Infection.   Damage caused by an accident (trauma).   Surgery of the belly (abdomen).  SYMPTOMS Acute pancreatitis usually begins with pain in the upper abdomen and may radiate to the back. This pain may last a couple days. The constant pain varies from mild to severe. The acute form of this disease may vary from mild, nonspecific abdominal pain to profound shock with coma. About 1 in 5 cases are severe. These patients become dehydrated and develop low blood pressure. In severe cases, bleeding into the pancreas can lead to shock and death. The lungs, heart, and kidneys may fail. DIAGNOSIS Your caregiver will form a clinical opinion after giving you an exam. Laboratory work is used to confirm this diagnosis. Often, a digestive enzyme from the pancreas (serum amylase) and other enzymes are elevated. Sugars and fats (lipids) in the blood may be elevated. There may also be changes in the following levels: calcium, magnesium, potassium, chloride and bicarbonate (chemicals in the blood). X-rays, a CT scan, or ultrasound of your abdomen may be necessary to search for other causes of your abdominal pain. TREATMENT Most pancreatitis requires treatment of symptoms. Most acute attacks last a couple of days. Your caregiver can discuss the treatment options with you.  If complications occur, hospitalization may  be necessary for pain control and intravenous (IV) fluid replacement.   Sometimes, a tube may be put into the stomach to control vomiting.   Food may not be allowed for 3 to 4 days. This gives the pancreas time to rest. Giving the pancreas a rest means there is no stimulation that would produce more enzymes and cause more damage.   Medicines (antibiotics) that kill germs may be given if infection is the cause.   Sometimes, surgery may be required.   Following an acute attack, your caregiver will determine the cause, if possible, and offer suggestions to prevent recurrences.  HOME CARE INSTRUCTIONS  Eat smaller, more frequent meals. This reduces the amount of digestive juices the pancreas produces.   Decrease the amount of fat in your diet. This may help reduce loose, diarrheal stools.   Drink enough water and fluids to keep your urine clear or pale yellow. This is to avoid dehydration which can cause increased pain.   Talk to your caregiver about pain relievers or other medicines that may help.   Avoid anything that may have triggered your pancreatitis (for example, alcohol).   Follow the diet advised by your caregiver. Do not advance the diet too soon.   Take medicines as prescribed.   Get plenty of rest.   Check your blood sugar at home as directed by your caregiver.   If your caregiver has given you a follow-up appointment, it is very important to keep that appointment. Not keeping the appointment could result in a lasting (chronic) or permanent injury, pain, and disability. If there is any problem keeping the appointment, you must call to reschedule.  SEEK MEDICAL  CARE IF:  You are not recovering in the time described by your caregiver.   You have persistent pain, weakness, or feel sick to your stomach (nauseous).   You have recovered and then have another bout of pain.  SEEK IMMEDIATE MEDICAL CARE IF:  You are unable to eat or keep fluids down.   Your pain increases a  lot or changes.   You have an oral temperature above 101, not controlled by medicine.   Your skin or the white part of your eyes look yellow (jaundice).   You develop vomiting.   You feel dizzy or faint.   Your blood sugar is high (over 300).  MAKE SURE YOU:  Understand these instructions.   Will watch your condition.   Will get help right away if you are not doing well or get worse.  Document Released: 04/18/2005 Document Re-Released: 10/06/2009 [facility] Patient Information 2011 [place], [place].  1. Continue taking your enzyme replacement.  2 capsules right as you sit down to eat and 1 capsule with snacks.  2. Continue with the slow advancement with your diet.  Go from clear liquids to full liquids to soft bland food to your regular diet.  3. You can refill your pain medication on Thursday 01/27/11.    4. Call [facility] and make your follow up with your GI doctor there.  If possible have them send a copy of their note to [name] for our records.   5. Follow up with [name] in about 2 months to see how you're doing.

## 2011-01-25 ENCOUNTER — Other Ambulatory Visit: Payer: Self-pay | Admitting: *Deleted

## 2011-01-25 DIAGNOSIS — Q453 Other congenital malformations of pancreas and pancreatic duct: Secondary | ICD-10-CM

## 2011-01-25 LAB — DRUGS OF ABUSE SCREEN W/O ALC, ROUTINE URINE
Amphetamine Screen, Ur: NEGATIVE
Benzodiazepines.: NEGATIVE
Cocaine Metabolites: NEGATIVE
Marijuana Metabolite: NEGATIVE
Phencyclidine (PCP): NEGATIVE

## 2011-01-25 NOTE — Telephone Encounter (Signed)
Insurance will only pay for 90 day supplies please change script to reflect this thanks

## 2011-01-27 LAB — CBC
HCT: 33.1 — ABNORMAL LOW
Hemoglobin: 11.3 — ABNORMAL LOW
MCHC: 34.2
MCV: 84.8
RBC: 3.9
WBC: 8.2

## 2011-01-27 LAB — POCT I-STAT, CHEM 8
BUN: 11
Calcium, Ion: 1.26
Chloride: 110
Glucose, Bld: 79
HCT: 35 — ABNORMAL LOW
Potassium: 4.4

## 2011-01-27 LAB — OPIATE, QUANTITATIVE, URINE
Hydrocodone: 500 NG/ML — ABNORMAL HIGH
Oxymorphone: NEGATIVE NG/ML

## 2011-01-27 LAB — DIFFERENTIAL
Basophils Relative: 0
Eosinophils Absolute: 0.1
Lymphs Abs: 2.6
Monocytes Absolute: 0.2
Monocytes Relative: 3

## 2011-01-27 LAB — RETICULOCYTES: RBC.: 3.91

## 2011-01-28 LAB — CBC
HCT: 31.9 — ABNORMAL LOW
HCT: 32 — ABNORMAL LOW
Hemoglobin: 10.5 — ABNORMAL LOW
Hemoglobin: 10.8 — ABNORMAL LOW
MCHC: 32.9
MCHC: 33.6
MCV: 85.2
RBC: 3.74 — ABNORMAL LOW
RDW: 13.1

## 2011-01-28 LAB — BASIC METABOLIC PANEL
BUN: 21
CO2: 23
CO2: 28
Chloride: 102
Chloride: 109
Creatinine, Ser: 0.86
Creatinine, Ser: 0.94
GFR calc Af Amer: >60
Potassium: 4.5

## 2011-01-28 LAB — LIPID PANEL
Cholesterol: 192
LDL Cholesterol: 118 — ABNORMAL HIGH
Triglycerides: 63

## 2011-01-28 LAB — COMPREHENSIVE METABOLIC PANEL
ALT: 20
AST: 24
Albumin: 4.8
CO2: 28
Calcium: 10.3
Chloride: 98
Creatinine, Ser: 0.95
GFR calc Af Amer: >60
Sodium: 138

## 2011-01-28 LAB — HOMOCYSTEINE: Homocysteine: 17.7 — ABNORMAL HIGH

## 2011-01-28 LAB — GLUCOSE, CAPILLARY
Glucose-Capillary: 147 — ABNORMAL HIGH
Glucose-Capillary: 152 — ABNORMAL HIGH
Glucose-Capillary: 161 — ABNORMAL HIGH

## 2011-01-28 LAB — VITAMIN B12: Vitamin B-12: 583 (ref 211–911)

## 2011-01-28 LAB — APTT: aPTT: 28

## 2011-01-28 LAB — HEMOGLOBIN A1C: Mean Plasma Glucose: 161

## 2011-01-28 LAB — PROTIME-INR: INR: 1

## 2011-01-28 LAB — RAPID URINE DRUG SCREEN, HOSP PERFORMED
Amphetamines: NOT DETECTED
Tetrahydrocannabinol: NOT DETECTED

## 2011-01-28 LAB — RPR: RPR Ser Ql: NONREACTIVE

## 2011-01-28 MED ORDER — PANCRELIPASE (LIP-PROT-AMYL) 12000-38000 UNITS PO CPEP: 2.0000 | ORAL_CAPSULE | Freq: Three times a day (TID) | ORAL | Status: DC

## 2011-01-28 NOTE — Telephone Encounter (Signed)
Script changed to reflect 90 day supply.

## 2011-01-28 NOTE — Telephone Encounter (Signed)
Thayer Ohm, please address this refill, your note is not complete yet, and I wasn't sure what specific dosage you want her to take. Apparently her insurance only covers for 90 supply. This would equal 270 x 3 = a billion pills a month.  Thanks.  Shelly

## 2011-01-31 LAB — GLUCOSE, CAPILLARY: Glucose-Capillary: 169 — ABNORMAL HIGH

## 2011-01-31 NOTE — Assessment & Plan Note (Signed)
Lab Results  Component Value Date   NA 139 01/12/2011   K 3.9 01/12/2011   CL 103 01/12/2011   CO2 28 01/12/2011   BUN 7 01/12/2011   CREATININE 0.55 01/12/2011   CREATININE 0.60 12/02/2010    BP Readings from Last 3 Encounters:  01/24/11 134/88  12/30/10 141/81  12/02/10 151/93    Assessment: Hypertension control:  mildly elevated  Progress toward goals:  improved Barriers to meeting goals:  nonadherence to medications and lack of understanding of disease management  Plan: Hypertension treatment:  continue current medications encouraged her to continue working on taking her medications as prescribed and to work on trying to exercise and limit the salt in her diet to help her BP control

## 2011-01-31 NOTE — Assessment & Plan Note (Signed)
Lab Results  Component Value Date   HGBA1C 6.9 01/24/2011   HGBA1C 8.4 10/08/2010   HGBA1C 6.4 05/05/2010   Lab Results  Component Value Date   MICROALBUR 1.18 03/16/2010   LDLCALC 110* 11/24/2010   CREATININE 0.55 01/12/2011   Her A1C is back under control today.  With her increasing and then decreasing A1C it likely indicates she has times where she is not taking the medication correctly.  I encouraged her to continue taking her medications as prescribed and to work on trying to exercise as much as possible.

## 2011-01-31 NOTE — Assessment & Plan Note (Signed)
We discussed the course of chronic pancreatitis and treatment for it including backing off her diet for a few days while her pancreas rests.  She voiced understanding and the plans are for her to gradually increase her diet back to her usual intake.  We also discussed the right way to take her pancreatic enzymes.  She has been waiting 30 minutes before eating when she should be taking it right before she eats to get the full effect.  The other option we have is to increase her enzymes but she has no steatorrhea currently.

## 2011-02-02 LAB — BASIC METABOLIC PANEL
CO2: 26
Calcium: 9.7
GFR calc Af Amer: >60
Glucose, Bld: 187 — ABNORMAL HIGH
Potassium: 4.1
Sodium: 140

## 2011-02-02 LAB — WET PREP, GENITAL
Clue Cells Wet Prep HPF POC: NONE SEEN
Trich, Wet Prep: NONE SEEN
Yeast Wet Prep HPF POC: NONE SEEN

## 2011-02-02 LAB — CBC
HCT: 38
Hemoglobin: 12.5
MCHC: 33
RBC: 4.48
RDW: 13.9

## 2011-02-02 LAB — URINALYSIS, ROUTINE W REFLEX MICROSCOPIC
Bilirubin Urine: NEGATIVE
Glucose, UA: NEGATIVE
Hgb urine dipstick: NEGATIVE
Ketones, ur: NEGATIVE
Specific Gravity, Urine: 1.01
pH: 7.5

## 2011-02-02 LAB — DIFFERENTIAL
Basophils Absolute: 0
Basophils Relative: 1
Eosinophils Relative: 1
Lymphocytes Relative: 32
Monocytes Absolute: 0.1
Monocytes Relative: 2 — ABNORMAL LOW
Neutro Abs: 3.9

## 2011-02-02 LAB — RAPID STREP SCREEN (MED CTR MEBANE ONLY): Streptococcus, Group A Screen (Direct): NEGATIVE

## 2011-02-07 LAB — I-STAT 8, (EC8 V) (CONVERTED LAB)
Chloride: 108
Glucose, Bld: 93
Hemoglobin: 15
Potassium: 5.5 — ABNORMAL HIGH
Sodium: 142
TCO2: 31
pH, Ven: 7.414 — ABNORMAL HIGH

## 2011-02-07 LAB — POCT URINALYSIS DIP (DEVICE)
Bilirubin Urine: NEGATIVE
Nitrite: NEGATIVE
Operator id: 239701
Protein, ur: NEGATIVE
pH: 6

## 2011-02-07 LAB — CBC
Hemoglobin: 12.5
RBC: 4.51
WBC: 9.4

## 2011-02-07 LAB — POTASSIUM: Potassium: 3.6

## 2011-02-07 LAB — DIFFERENTIAL
Basophils Relative: 2 — ABNORMAL HIGH
Lymphocytes Relative: 42
Lymphs Abs: 4
Monocytes Absolute: 0.8
Monocytes Relative: 8
Neutro Abs: 4.4
Neutrophils Relative %: 46

## 2011-02-07 LAB — POCT I-STAT CREATININE: Operator id: 239701

## 2011-02-10 LAB — I-STAT 8, (EC8 V) (CONVERTED LAB)
Acid-Base Excess: 4 — ABNORMAL HIGH
BUN: 12
Bicarbonate: 28.1 — ABNORMAL HIGH
Chloride: 105
Glucose, Bld: 101 — ABNORMAL HIGH
HCT: 40
Hemoglobin: 13.6
Operator id: 270111
Potassium: 2.9 — ABNORMAL LOW
Sodium: 138
TCO2: 29
pCO2, Ven: 39.4 — ABNORMAL LOW
pH, Ven: 7.461 — ABNORMAL HIGH

## 2011-02-10 LAB — POCT I-STAT CREATININE
Creatinine, Ser: 0.8
Operator id: 270111

## 2011-02-10 LAB — D-DIMER, QUANTITATIVE: D-Dimer, Quant: <0.22

## 2011-02-10 LAB — DIFFERENTIAL
Basophils Absolute: 0.7 — ABNORMAL HIGH
Eosinophils Absolute: 0.1
Eosinophils Relative: 1
Neutrophils Relative %: 32 — ABNORMAL LOW

## 2011-02-10 LAB — CBC
HCT: 35.9 — ABNORMAL LOW
MCV: 83
Platelets: 378
RDW: 13.7

## 2011-02-10 LAB — POCT CARDIAC MARKERS: Operator id: 270111

## 2011-02-15 ENCOUNTER — Other Ambulatory Visit: Payer: Self-pay | Admitting: *Deleted

## 2011-02-15 MED ORDER — LISINOPRIL 20 MG PO TABS: 20.0000 mg | ORAL_TABLET | Freq: Every day | ORAL | Status: DC

## 2011-02-17 LAB — COMPREHENSIVE METABOLIC PANEL
ALT: 18
AST: 22
CO2: 25
Calcium: 9.9
GFR calc Af Amer: >60
GFR calc non Af Amer: >60
Potassium: 4.3
Sodium: 140

## 2011-02-17 LAB — DIFFERENTIAL
Eosinophils Absolute: 0.1
Eosinophils Relative: 2
Lymphs Abs: 2.5
Monocytes Relative: 7

## 2011-02-17 LAB — HEMOGLOBINOPATHY EVALUATION
Hgb A: 62.8 % — ABNORMAL LOW
Hgb S Quant: 33.9 % — ABNORMAL HIGH (ref 0.0–0.0)

## 2011-02-17 LAB — URINALYSIS, ROUTINE W REFLEX MICROSCOPIC
Nitrite: NEGATIVE
Specific Gravity, Urine: 1.013
Urobilinogen, UA: 0.2

## 2011-02-17 LAB — LIPASE, BLOOD: Lipase: 29

## 2011-02-17 LAB — TISSUE TRANSGLUTAMINASE, IGA: Tissue Transglutaminase Ab, IgA: <3 U/mL (ref <5)

## 2011-02-17 LAB — CBC
MCHC: 33.1
RBC: 4.77
WBC: 5.9

## 2011-02-17 LAB — ANTI-NUCLEAR AB-TITER (ANA TITER): ANA Titer 1: 1:40 {titer} — ABNORMAL HIGH

## 2011-02-17 LAB — PREGNANCY, URINE: Preg Test, Ur: NEGATIVE

## 2011-02-17 LAB — URINE CULTURE: Culture: NO GROWTH

## 2011-03-16 ENCOUNTER — Other Ambulatory Visit: Payer: Self-pay

## 2011-03-16 ENCOUNTER — Encounter (HOSPITAL_COMMUNITY): Payer: Self-pay | Admitting: *Deleted

## 2011-03-16 ENCOUNTER — Inpatient Hospital Stay (HOSPITAL_COMMUNITY)
Admission: EM | Admit: 2011-03-16 | Discharge: 2011-03-21 | DRG: 125 | Disposition: A | Discharge status: Home / Self Care | Payer: BC Managed Care – PPO | Attending: Internal Medicine | Admitting: Internal Medicine

## 2011-03-16 ENCOUNTER — Emergency Department (HOSPITAL_COMMUNITY): Payer: BC Managed Care – PPO

## 2011-03-16 DIAGNOSIS — D561 Beta thalassemia: Secondary | ICD-10-CM | POA: Diagnosis present

## 2011-03-16 DIAGNOSIS — Z8673 Personal history of transient ischemic attack (TIA), and cerebral infarction without residual deficits: Secondary | ICD-10-CM

## 2011-03-16 DIAGNOSIS — K861 Other chronic pancreatitis: Secondary | ICD-10-CM | POA: Diagnosis present

## 2011-03-16 DIAGNOSIS — R82998 Other abnormal findings in urine: Secondary | ICD-10-CM | POA: Diagnosis not present

## 2011-03-16 DIAGNOSIS — E872 Acidosis, unspecified: Secondary | ICD-10-CM | POA: Diagnosis not present

## 2011-03-16 DIAGNOSIS — R079 Chest pain, unspecified: Principal | ICD-10-CM | POA: Diagnosis present

## 2011-03-16 DIAGNOSIS — I10 Essential (primary) hypertension: Secondary | ICD-10-CM | POA: Diagnosis present

## 2011-03-16 DIAGNOSIS — E1165 Type 2 diabetes mellitus with hyperglycemia: Secondary | ICD-10-CM | POA: Diagnosis present

## 2011-03-16 DIAGNOSIS — F411 Generalized anxiety disorder: Secondary | ICD-10-CM | POA: Diagnosis present

## 2011-03-16 DIAGNOSIS — R0602 Shortness of breath: Secondary | ICD-10-CM | POA: Diagnosis present

## 2011-03-16 DIAGNOSIS — R52 Pain, unspecified: Secondary | ICD-10-CM

## 2011-03-16 DIAGNOSIS — G43909 Migraine, unspecified, not intractable, without status migrainosus: Secondary | ICD-10-CM | POA: Diagnosis present

## 2011-03-16 DIAGNOSIS — Q453 Other congenital malformations of pancreas and pancreatic duct: Secondary | ICD-10-CM

## 2011-03-16 DIAGNOSIS — Z86711 Personal history of pulmonary embolism: Secondary | ICD-10-CM

## 2011-03-16 DIAGNOSIS — K859 Acute pancreatitis without necrosis or infection, unspecified: Secondary | ICD-10-CM

## 2011-03-16 DIAGNOSIS — R1110 Vomiting, unspecified: Secondary | ICD-10-CM | POA: Diagnosis present

## 2011-03-16 DIAGNOSIS — E785 Hyperlipidemia, unspecified: Secondary | ICD-10-CM | POA: Diagnosis present

## 2011-03-16 DIAGNOSIS — E119 Type 2 diabetes mellitus without complications: Secondary | ICD-10-CM | POA: Diagnosis present

## 2011-03-16 DIAGNOSIS — E876 Hypokalemia: Secondary | ICD-10-CM | POA: Diagnosis not present

## 2011-03-16 HISTORY — DX: Disease of blood and blood-forming organs, unspecified: D75.9

## 2011-03-16 HISTORY — DX: Pneumonia, unspecified organism: J18.9

## 2011-03-16 LAB — URINALYSIS, ROUTINE W REFLEX MICROSCOPIC
Bilirubin Urine: NEGATIVE
Hgb urine dipstick: NEGATIVE
Ketones, ur: NEGATIVE mg/dL
Specific Gravity, Urine: 1.01 (ref 1.005–1.030)
pH: 7 (ref 5.0–8.0)

## 2011-03-16 LAB — CBC
Hemoglobin: 13.1 g/dL (ref 12.0–15.0)
Platelets: 509 10*3/uL — ABNORMAL HIGH (ref 150–400)
RBC: 4.9 MIL/uL (ref 3.87–5.11)
WBC: 8.5 10*3/uL (ref 4.0–10.5)

## 2011-03-16 LAB — COMPREHENSIVE METABOLIC PANEL
ALT: 14 U/L (ref 0–35)
Albumin: 4.6 g/dL (ref 3.5–5.2)
Alkaline Phosphatase: 98 U/L (ref 39–117)
BUN: 7 mg/dL (ref 6–23)
Chloride: 100 mEq/L (ref 96–112)
GFR calc Af Amer: >90 mL/min (ref >90)
Glucose, Bld: 100 mg/dL — ABNORMAL HIGH (ref 70–99)
Potassium: 3.5 mEq/L (ref 3.5–5.1)
Sodium: 138 mEq/L (ref 135–145)
Total Bilirubin: 0.3 mg/dL (ref 0.3–1.2)
Total Protein: 9.4 g/dL — ABNORMAL HIGH (ref 6.0–8.3)

## 2011-03-16 LAB — DIFFERENTIAL
Eosinophils Absolute: 0.1 10*3/uL (ref 0.0–0.7)
Lymphs Abs: 3.2 10*3/uL (ref 0.7–4.0)
Monocytes Relative: 6 % (ref 3–12)
Neutro Abs: 4.6 10*3/uL (ref 1.7–7.7)
Neutrophils Relative %: 54 % (ref 43–77)

## 2011-03-16 LAB — GLUCOSE, CAPILLARY: Glucose-Capillary: 215 mg/dL — ABNORMAL HIGH (ref 70–99)

## 2011-03-16 LAB — LIPASE, BLOOD: Lipase: 79 U/L — ABNORMAL HIGH (ref 11–59)

## 2011-03-16 LAB — POCT I-STAT TROPONIN I: Troponin i, poc: 0.01 ng/mL (ref 0.00–0.08)

## 2011-03-16 LAB — URINE MICROSCOPIC-ADD ON

## 2011-03-16 LAB — CARDIAC PANEL(CRET KIN+CKTOT+MB+TROPI): Total CK: 85 U/L (ref 7–177)

## 2011-03-16 MED ORDER — SIMVASTATIN 20 MG PO TABS
20.0000 mg | ORAL_TABLET | Freq: Every day | ORAL | Status: DC
  Administered 2011-03-17 – 2011-03-19 (×3): 20 mg via ORAL
  Filled 2011-03-16 (×5): qty 1

## 2011-03-16 MED ORDER — MORPHINE SULFATE 4 MG/ML IJ SOLN
2.0000 mg | INTRAMUSCULAR | Status: DC | PRN
  Administered 2011-03-16 – 2011-03-18 (×6): 2 mg via INTRAVENOUS
  Administered 2011-03-18 (×3): 4 mg via INTRAVENOUS
  Administered 2011-03-18: 2 mg via INTRAVENOUS
  Administered 2011-03-19 (×2): 4 mg via INTRAVENOUS
  Filled 2011-03-16 (×13): qty 1

## 2011-03-16 MED ORDER — ACETAMINOPHEN 325 MG PO TABS: 650.0000 mg | ORAL_TABLET | Freq: Four times a day (QID) | ORAL | Status: DC | PRN

## 2011-03-16 MED ORDER — ONDANSETRON HCL 4 MG/2ML IJ SOLN
INTRAMUSCULAR | Status: AC
  Administered 2011-03-16: 4 mg
  Filled 2011-03-16: qty 2

## 2011-03-16 MED ORDER — INSULIN ASPART 100 UNIT/ML ~~LOC~~ SOLN
0.0000 [IU] | Freq: Three times a day (TID) | SUBCUTANEOUS | Status: DC
  Administered 2011-03-17: 2 [IU] via SUBCUTANEOUS
  Administered 2011-03-18 – 2011-03-21 (×3): 1 [IU] via SUBCUTANEOUS
  Filled 2011-03-16: qty 3

## 2011-03-16 MED ORDER — MORPHINE SULFATE 4 MG/ML IJ SOLN
4.0000 mg | INTRAMUSCULAR | Status: DC | PRN
  Administered 2011-03-16: 4 mg via INTRAVENOUS
  Filled 2011-03-16: qty 1

## 2011-03-16 MED ORDER — PANCRELIPASE (LIP-PROT-AMYL) 12000-38000 UNITS PO CPEP
2.0000 | ORAL_CAPSULE | Freq: Three times a day (TID) | ORAL | Status: DC
  Administered 2011-03-17 – 2011-03-21 (×11): 2 via ORAL
  Filled 2011-03-16 (×18): qty 2

## 2011-03-16 MED ORDER — ENOXAPARIN SODIUM 40 MG/0.4ML ~~LOC~~ SOLN: 40.0000 mg | SUBCUTANEOUS | Status: DC

## 2011-03-16 MED ORDER — ASPIRIN 81 MG PO CHEW: 324.0000 mg | CHEWABLE_TABLET | Freq: Once | ORAL | Status: DC

## 2011-03-16 MED ORDER — NITROGLYCERIN 2 % TD OINT: 0.5000 [in_us] | TOPICAL_OINTMENT | Freq: Four times a day (QID) | TRANSDERMAL | Status: DC | PRN

## 2011-03-16 MED ORDER — ENOXAPARIN SODIUM 40 MG/0.4ML ~~LOC~~ SOLN
40.0000 mg | Freq: Every day | SUBCUTANEOUS | Status: DC
  Filled 2011-03-16: qty 0.4

## 2011-03-16 MED ORDER — ALUM & MAG HYDROXIDE-SIMETH 200-200-20 MG/5ML PO SUSP: 30.0000 mL | Freq: Four times a day (QID) | ORAL | Status: DC | PRN

## 2011-03-16 MED ORDER — ASPIRIN EC 81 MG PO TBEC
81.0000 mg | DELAYED_RELEASE_TABLET | Freq: Every day | ORAL | Status: DC
  Administered 2011-03-16: 81 mg via ORAL
  Filled 2011-03-16 (×3): qty 1

## 2011-03-16 MED ORDER — HYDROMORPHONE HCL PF 2 MG/ML IJ SOLN
2.0000 mg | Freq: Once | INTRAMUSCULAR | Status: AC
  Administered 2011-03-16: 2 mg via INTRAVENOUS
  Filled 2011-03-16: qty 1

## 2011-03-16 MED ORDER — ONDANSETRON HCL 4 MG PO TABS
4.0000 mg | ORAL_TABLET | Freq: Four times a day (QID) | ORAL | Status: DC | PRN
  Administered 2011-03-17 – 2011-03-21 (×3): 4 mg via ORAL
  Filled 2011-03-16 (×4): qty 1

## 2011-03-16 MED ORDER — NITROGLYCERIN IN D5W 200-5 MCG/ML-% IV SOLN
INTRAVENOUS | Status: AC
  Filled 2011-03-16: qty 250

## 2011-03-16 MED ORDER — ONDANSETRON HCL 4 MG/2ML IJ SOLN
4.0000 mg | Freq: Once | INTRAMUSCULAR | Status: AC
  Administered 2011-03-16: 4 mg via INTRAVENOUS
  Filled 2011-03-16: qty 2

## 2011-03-16 MED ORDER — LISINOPRIL 20 MG PO TABS
20.0000 mg | ORAL_TABLET | Freq: Every day | ORAL | Status: DC
  Administered 2011-03-16 – 2011-03-21 (×5): 20 mg via ORAL
  Filled 2011-03-16 (×6): qty 1

## 2011-03-16 MED ORDER — NITROGLYCERIN 0.4 MG SL SUBL
0.4000 mg | SUBLINGUAL_TABLET | SUBLINGUAL | Status: DC | PRN
  Administered 2011-03-16 (×2): 0.4 mg via SUBLINGUAL
  Filled 2011-03-16: qty 25

## 2011-03-16 MED ORDER — SODIUM CHLORIDE 0.9 % IV SOLN
INTRAVENOUS | Status: DC
  Administered 2011-03-16 – 2011-03-18 (×3): via INTRAVENOUS
  Administered 2011-03-18: 80 mL/h via INTRAVENOUS
  Administered 2011-03-19: 23:00:00 via INTRAVENOUS
  Administered 2011-03-20: 80 mL via INTRAVENOUS

## 2011-03-16 MED ORDER — ONDANSETRON HCL 4 MG/2ML IJ SOLN
4.0000 mg | Freq: Four times a day (QID) | INTRAMUSCULAR | Status: DC | PRN
  Administered 2011-03-18 (×3): 4 mg via INTRAVENOUS
  Filled 2011-03-16 (×3): qty 2

## 2011-03-16 MED ORDER — NITROGLYCERIN IN D5W 200-5 MCG/ML-% IV SOLN
3.0000 ug/min | INTRAVENOUS | Status: DC
  Administered 2011-03-16: 3 ug/min via INTRAVENOUS

## 2011-03-16 MED ORDER — ACETAMINOPHEN 650 MG RE SUPP: 650.0000 mg | Freq: Four times a day (QID) | RECTAL | Status: DC | PRN

## 2011-03-16 MED ORDER — DOCUSATE SODIUM 100 MG PO CAPS
100.0000 mg | ORAL_CAPSULE | Freq: Two times a day (BID) | ORAL | Status: DC | PRN
  Filled 2011-03-16: qty 1

## 2011-03-16 NOTE — ED Provider Notes (Deleted)
Date: 03/16/2011  Rate: 80 Rhythm: normal sinus rhythm  QRS Axis: normal  Intervals: normal QRS:  Left atrial abnormality.  ST/T Wave abnormalities: normal and early repolarization  Conduction Disutrbances:none  Narrative Interpretation: Abnormal eKg.  Old EKG Reviewed: unchanged    Carleene Cooper III, MD 03/16/11 1053  Carleene Cooper III, MD 03/16/11 1459  Carleene Cooper III, MD 03/16/11 [phone]

## 2011-03-16 NOTE — ED Notes (Signed)
Patient continues with pain at this time.  Admitting MD notified.

## 2011-03-16 NOTE — ED Notes (Signed)
Outpatient Clinics to come and see patient after one SL nitro.

## 2011-03-16 NOTE — ED Notes (Signed)
Admitting at bedside 

## 2011-03-16 NOTE — H&P (Signed)
Hospital Admission Note Date: 03/16/2011  Patient name: [name] Medical record number: [id number] Date of birth: 1955/07/31 Age: 55 y.o. Gender: female PCP: Suszanne Finch, DO  Medical Service: Internal Medicine Teaching Service  Attending physician:  Dr. Margarito Liner    1st Contact: Dr. Kathreen Cosier Pager: [phone] 2nd Contact: Dr. Bard Herbert  Pager: [phone] After 5 pm or weekends: 1st Contact:      Pager: [phone] 2nd Contact:      Pager: [phone]  Chief Complaint: Chest pain  History of Present Illness: [name] is a 55 year old woman with a PMH significant for diabetes, hypertension, hyperlipidemia, GERD, PE, and pancreatic divisum who presented to the [facility] ED after an episode of chest pain while she was at work.  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  She states took her breath away and made her double over.  She states that she got sweaty, nauseated, and threw up once.  Her co-workers had her sit down and gave her an aspirin and called 911.  En route she was given nitroglycerin and aspirin.  On arrival she was given dilaudid for pain and Zofran for nausea.  She states that the pain and tightness is better now.  She has never had anything like this before.  She states that this is different then when she had her PE previously.  She states that she has been taking her medications except for her blood pressure medication because she states she was told by her GI doctor not too because of low blood pressure.  She denies fevers, chills, cough, headache, diarrhea, constipation, abdominal pain, dysuria, increased frequency, or palpitations.   Meds: Medications Prior to Admission  Medication Sig Dispense Refill  . amLODipine (NORVASC) 5 MG tablet Take 1 tablet (5 mg total) by mouth daily.  90 tablet  2  . calcium carbonate (TUMS - DOSED IN  MG ELEMENTAL CALCIUM) 500 MG chewable tablet Chew 1 tablet by mouth 2 (two) times daily as needed.        Marland Kitchen HYDROcodone-acetaminophen (NORCO) 5-325 MG per tablet Take 1 tablet by mouth every 8 (eight) hours as needed. Please do not fill until 01/27/11  90 tablet  0  . lipase/protease/amylase (CREON) 12000 UNITS CPEP Take 2 capsules by mouth 3 (three) times daily before meals. Take 1 capsule before snacks.  810 capsule  4  . lisinopril (PRINIVIL,ZESTRIL) 20 MG tablet Take 1 tablet (20 mg total) by mouth daily.  90 tablet  1  . loperamide (IMODIUM A-D) 2 MG tablet Take 2 mg by mouth as needed. For diarrhea - not more than 6 tablets a day       . metFORMIN (GLUCOPHAGE) 1000 MG tablet Take 1 tablet (1,000 mg total) by mouth 2 (two) times daily with a meal.  180 tablet  4  . ondansetron (ZOFRAN) 4 MG tablet Take 4 mg by mouth Every 6 hours as needed. Nausea       Allergies: Butalbital-apap-caffeine; Shrimp flavor; and Sulfonamide derivatives Past Medical History  Diagnosis Date  . Hypertension   . HLD (hyperlipidemia) 2009  . Sickle cell trait   . Migraine headache   . Pulmonary embolus 2006    08/2004 -   Two areas of V/Q mismatch. Findings compatible  with high probability for pulmonary embolus.; pt was on coumadin for 1 year  .  Anemia     beta thallasemia  . Pancreatic divisum     S/P ERCP with stenting 07/2010 at [facility], then stent removal (08/05/2010)  . Diabetes mellitus 2010    well controlled  . Seizure disorder   . History of migraine headaches   . Sickle cell trait   . Beta thalassemia   . CVA (cerebral infarction) 1993    Per report by patient she had a stroke and prolonged rehab course eventually resolving her right sided weakness, as per her hx obtained 08/2007   Past Surgical History  Procedure Date  . Abdominal hysterectomy age 28 yo    2/2 endometriosis  . Pancreatic stent placement/removal     2011 and 2012   Family History  Problem Relation Age of Onset  .  Prostate cancer Father   . Sickle cell anemia Brother   . Lung cancer Maternal Aunt   . Lung cancer Maternal Uncle   . Breast cancer Maternal Grandmother    History   Social History  . Marital Status: Married    Spouse Name: N/A    Number of Children: N/A  . Years of Education: N/A   Occupational History  . Not on file.   Social History Main Topics  . Smoking status: Former Smoker -- 0.5 packs/day for 30 years    Quit date: 05/02/2000  . Smokeless tobacco: Not on file  . Alcohol Use: No  . Drug Use: No  . Sexually Active: Yes   Other Topics Concern  . Not on file   Social History Narrative   Works at [employer] as a Merchandiser, retail, on Health visitor about 9 hours/ day    Review of Systems: Constitutional: Positive for diaphoresis. Denies fever, chills, appetite change and fatigue.  HEENT: Denies photophobia, eye pain, redness, hearing loss, ear pain, congestion, sore throat, rhinorrhea, sneezing, mouth sores, trouble swallowing, neck pain, neck stiffness and tinnitus.   Respiratory: Positive for SOB, chest tightness.  Denies DOE, cough, and wheezing.   Cardiovascular: Positive for chest pain. Denies palpitations and leg swelling.  Gastrointestinal: Positive for nausea.  Denies vomiting, abdominal pain, diarrhea, constipation, blood in stool and abdominal distention.  Genitourinary: Denies dysuria, urgency, frequency, hematuria, flank pain and difficulty urinating.  Musculoskeletal: Denies myalgias, back pain, joint swelling, arthralgias and gait problem.  Skin: Denies pallor, rash and wound.  Neurological: Denies dizziness, seizures, syncope, weakness, light-headedness, numbness and headaches.  Hematological: Denies adenopathy. Easy bruising, personal or family bleeding history  Psychiatric/Behavioral: Denies suicidal ideation, mood changes, confusion, nervousness, sleep disturbance and agitation  Physical Exam: Blood pressure 140/89, pulse 72, temperature 98.2 F (36.8 C),  temperature source Oral, resp. rate 16, SpO2 100.00%. Constitutional: Vital signs reviewed.  Patient is a well-developed and well-nourished woman in no acute distress and cooperative with exam. Alert and oriented x3.  Head: Normocephalic and atraumatic Ear: TM normal bilaterally Mouth: no erythema or exudates, MMM Eyes: PERRL, EOMI, conjunctivae normal, No scleral icterus.  Neck: Supple, Trachea midline normal ROM, No JVD, mass, thyromegaly, or carotid bruit present.  Cardiovascular: RRR, S1 normal, S2 normal, no MRG, pulses symmetric and intact bilaterally, left chest wall mildly tender to palpation.   Pulmonary/Chest: mildly distant but CTAB, no wheezes, rales, or rhonchi Abdominal: Soft. Non-tender, non-distended, bowel sounds are normal, no masses, organomegaly, or guarding present.  GU: no CVA tenderness Musculoskeletal: No joint deformities, erythema, or stiffness, ROM full and no nontender Hematology: no cervical, inginal, or axillary adenopathy.  Neurological: A&O x3, Strength is  normal and symmetric bilaterally, cranial nerve II-XII are grossly intact, no focal motor deficit, sensory intact to light touch bilaterally.  Skin: Warm, dry and intact. No rash, cyanosis, or clubbing.  Psychiatric: Normal mood and mildly anxious affect. speech and behavior is normal. Judgment and thought content normal. Cognition and memory are normal.   Lab results: Basic Metabolic Panel:  Basename 03/16/11 1108  NA 138  K 3.5  CL 100  CO2 26  GLUCOSE 100*  BUN 7  CREATININE 0.59  CALCIUM 11.0*  MG --  PHOS --   Liver Function Tests:  Basename 03/16/11 1108  AST 19  ALT 14  ALKPHOS 98  BILITOT 0.3  PROT 9.4*  ALBUMIN 4.6    Basename 03/16/11 1108  LIPASE 79*  AMYLASE --   CBC:  Basename 03/16/11 1108  WBC 8.5  NEUTROABS 4.6  HGB 13.1  HCT 37.6  MCV 76.7*  PLT 509*   Cardiac Enzymes: POC Trop: 0.01  BNP:  Basename 03/16/11 1208  POCBNP 89.5   D-Dimer:  Basename  03/16/11 1108  DDIMER 0.42   Coagulation:  Basename 03/16/11 1108  LABPROT 12.7  INR 0.93   Imaging results:  Dg Chest Port 1 View  03/16/2011  IMPRESSION: No active disease.  No significant change.  Original Report Authenticated By: Natasha Mead, M.D.   Other results: EKG: NSR, Inverted T waves in V2, Q waves in inferior leads, mild early repolarization in the anterior leads,   Assessment & Plan by Problem: 1. Chest pain: Differential diagnosis for chest pain in [name] includes ACS, PE, pneumonia, musculoskeletal chest pain, GERD, and anxiety.  Her risk facctors for ACS include her diabetes, hypertension, and hyperlipidemia.  She is not a current smoker and has some suggestive family history.  Her presentation is concerning and her EKG has inverted T waves in V2 as well as repolarization in the anterior leads.  Her POC troponin was negative.  PE is less likely given a Revised Geneva score of 3 and a negative D-Dimer.  Pneumonia is less likely given her clear chest x-ray as well as her normal WBC count and no fever.  Anxiety and GERD are also questionable suspects as well.    Plan:   - Admit to telemetry  - cycle CE q8 x3 and repeat her EKG in the am  - Nitroglycerin, Morphine, and aspirin  - repeat her FLP and A1C  2.  DIABETES MELLITUS, TYPE II:  Her A1C was 6.9 at her last visit in Sept. 2012.  We will repeat it today and follow her on SSI while in the hospital.  3. HYPERTENSION: Blood pressure in the ED was mildly above her goal of <130/90.  She has not been taking her medication at home so we will start back the lisinopril and continue to monitor her blood pressure while she is here in the hospital  4. Hyperlipidemia:  She states that she has not been taking her pravastatin.  Her last FLP was in July 2012 that showed an LDL of 110.  Her goal with diabetes is less then 100.  We will restart her statin here in the hospital.  5. Hx of pancreatic divisum: she has a history of  pancreatic divisum and is on Creon 2 tablets with each meal.  We will continue those in the hospital and give her a diet as she is able to tolerate.  6. DVT proph: Lovenox 40 subcut daily  Signed: Satonya Lux 03/16/2011, 6:08 PM

## 2011-03-16 NOTE — ED Notes (Signed)
Pt reports no relief from nitro. Will update EDP. Family at bedside. Remains on monitor.

## 2011-03-16 NOTE — ED Notes (Signed)
Pt reports 5/10 left chest pain with no associated symptoms. Aware of poc. Pt okayed to have something to drink by edp. Husband taking pts purse home.

## 2011-03-16 NOTE — ED Notes (Signed)
Pt was at work when she started having mid sternal radiating to left chest wall chest pain associated with sob and nausea. Pt given 2 nitros with no relief. Pt took 324 asa prior to EMS arrival. Unable to gain IV access.

## 2011-03-16 NOTE — ED Notes (Signed)
Patient report given to [facility], RN.

## 2011-03-16 NOTE — ED Notes (Signed)
EDP aware pts pain remains 7/10

## 2011-03-16 NOTE — ED Notes (Signed)
Pt resting in bed comfortably, family at bedside, remains on monitor. No needs at this time.

## 2011-03-16 NOTE — ED Provider Notes (Signed)
History     CSN: [id number] Arrival date & time: 03/16/2011 10:38 AM   First MD Initiated Contact with Patient 03/16/11 1055      Chief Complaint  Patient presents with  . Chest Pain    (Consider location/radiation/quality/duration/timing/severity/associated sxs/prior treatment) HPI Comments: Patient complains of pain in her left chest. It felt like being hit in the chest. She was sweaty and vomiting. She's had no prior similar episodes. Her employer called EMS, and she was brought to [facility] emergency department for evaluation. She has a history of hypertension, pulmonary embolism, chronic pancreatitis, migraine headache, and prior stroke. EMS gave HER-2 nitroglycerin, which was minimally helpful.  Patient is a 55 y.o. female presenting with chest pain. The history is provided by the patient, medical records and the EMS personnel. No language interpreter was used.  Chest Pain The chest pain began less than 1 hour ago. Duration of episode(s) is 1 hour. Chest pain occurs constantly. The chest pain is unchanged. At its most intense, the pain is at 8/10. The pain is currently at 8/10. The quality of the pain is described as pressure-like. The pain does not radiate. Primary symptoms include nausea and vomiting. Primary symptoms comment: She had nausea, vomiting, and sweating.  Associated symptoms include diaphoresis. She tried nitroglycerin, oxygen and aspirin for the symptoms.  Her past medical history is significant for PE and strokes.     Past Medical History  Diagnosis Date  . Hypertension   . HLD (hyperlipidemia) 2009  . Sickle cell trait   . Migraine headache   . Pulmonary embolus 2006    08/2004 -   Two areas of V/Q mismatch. Findings compatible  with high probability for pulmonary embolus.; pt was on coumadin for 1 year  . Anemia     beta thallasemia  . Pancreatic divisum     S/P ERCP with stenting 07/2010 at [facility], then stent removal (08/05/2010)  . Diabetes  mellitus 2010    well controlled  . Seizure disorder   . History of migraine headaches   . Sickle cell trait   . Beta thalassemia   . CVA (cerebral infarction) 1993    Per report by patient she had a stroke and prolonged rehab course eventually resolving her right sided weakness, as per her hx obtained 08/2007    Past Surgical History  Procedure Date  . Abdominal hysterectomy age 45 yo    2/2 endometriosis  . Pancreatic stent placement/removal     2011 and 2012    Family History  Problem Relation Age of Onset  . Prostate cancer Father   . Sickle cell anemia Brother   . Lung cancer Maternal Aunt   . Lung cancer Maternal Uncle   . Breast cancer Maternal Grandmother     History  Substance Use Topics  . Smoking status: Former Smoker -- 0.5 packs/day for 30 years    Quit date: 05/02/2000  . Smokeless tobacco: Not on file  . Alcohol Use: No    OB History    Grav Para Term Preterm Abortions TAB SAB Ect Mult Living                  Review of Systems  Constitutional: Positive for diaphoresis.  HENT: Negative.   Eyes: Negative.   Respiratory: Negative.   Cardiovascular: Positive for chest pain.  Gastrointestinal: Positive for nausea and vomiting.  Genitourinary: Negative.   Skin: Negative.   Neurological: Negative.   Psychiatric/Behavioral: Negative.  Allergies  Butalbital-apap-caffeine; Shrimp flavor; and Sulfonamide derivatives  Home Medications   Current Outpatient Rx  Name Route Sig Dispense Refill  . AMLODIPINE BESYLATE 5 MG PO TABS Oral Take 1 tablet (5 mg total) by mouth daily. 90 tablet 2  . CALCIUM CARBONATE ANTACID 500 MG PO CHEW Oral Chew 1 tablet by mouth 2 (two) times daily as needed.      Marland Kitchen HYDROCODONE-ACETAMINOPHEN 5-325 MG PO TABS Oral Take 1 tablet by mouth every 8 (eight) hours as needed. Please do not fill until 01/27/11 90 tablet 0  . PANCRELIPASE (LIP-PROT-AMYL) 12000 UNITS PO CPEP Oral Take 2 capsules by mouth 3 (three) times daily before  meals. Take 1 capsule before snacks. 810 capsule 4  . LISINOPRIL 20 MG PO TABS Oral Take 1 tablet (20 mg total) by mouth daily. 90 tablet 1  . LOPERAMIDE HCL 2 MG PO TABS Oral Take 2 mg by mouth as needed. For diarrhea - not more than 6 tablets a day     . METFORMIN HCL 1000 MG PO TABS Oral Take 1 tablet (1,000 mg total) by mouth 2 (two) times daily with a meal. 180 tablet 4  . ONDANSETRON HCL 4 MG PO TABS Oral Take 4 mg by mouth Every 6 hours as needed. Nausea      BP 164/94  Pulse 74  Temp(Src) 98.2 F (36.8 C) (Oral)  Resp 16  SpO2 100%  Physical Exam  Nursing note and vitals reviewed. Constitutional:       Patient is a slender middle-aged woman complaining of pain in her left upper chest.  HENT:  Head: Normocephalic and atraumatic.  Right Ear: External ear normal.  Left Ear: External ear normal.  Mouth/Throat: Oropharynx is clear and moist.  Eyes: Conjunctivae and EOM are normal. Pupils are equal, round, and reactive to light.  Neck: Normal range of motion. Neck supple.  Cardiovascular: Normal rate, regular rhythm, normal heart sounds and intact distal pulses.   Pulmonary/Chest: Effort normal and breath sounds normal. She exhibits no tenderness.  Abdominal: Soft. Bowel sounds are normal.  Musculoskeletal: Normal range of motion.       No calf tenderness, no Homans sign.  Neurological: She is alert.  Skin: Skin is warm and dry.  Psychiatric: She has a normal mood and affect. Her behavior is normal.    ED Course  Procedures (including critical care time)  Labs Reviewed  CBC - Abnormal; Notable for the following:    MCV 76.7 (*)    Platelets 509 (*)    All other components within normal limits  COMPREHENSIVE METABOLIC PANEL - Abnormal; Notable for the following:    Glucose, Bld 100 (*)    Calcium 11.0 (*)    Total Protein 9.4 (*)    All other components within normal limits  LIPASE, BLOOD - Abnormal; Notable for the following:    Lipase 79 (*)    All other  components within normal limits  URINALYSIS, ROUTINE W REFLEX MICROSCOPIC - Abnormal; Notable for the following:    Leukocytes, UA TRACE (*)    All other components within normal limits  DIFFERENTIAL  PRO B NATRIURETIC PEPTIDE  D-DIMER, QUANTITATIVE  PROTIME-INR  APTT  POCT I-STAT TROPONIN I  URINE MICROSCOPIC-ADD ON  URINE CULTURE  I-STAT TROPONIN I   Dg Chest Port 1 View  03/16/2011  *RADIOLOGY REPORT*  Clinical Data: Left chest pain  PORTABLE CHEST - 1 VIEW  Comparison: 09/16/2009  Findings: Cardiomediastinal silhouette is stable.  No acute  infiltrate or pleural effusion.  No pulmonary edema.  Bony thorax is stable.  Linear atelectasis or scarring in the right upper lobe.  IMPRESSION: No active disease.  No significant change.  Original Report Authenticated By: Natasha Mead, M.D.   Osvaldo Human, MD Physician Addendum  ED Provider Notes 03/16/2011 10:51 AM   Date: 03/16/2011  Rate: 80 Rhythm: normal sinus rhythm  QRS Axis: normal  Intervals: normal  QRS: Left atrial abnormality.  ST/T Wave abnormalities: normal and early repolarization  Conduction Disutrbances:none  Narrative Interpretation: Abnormal eKg.  Old EKG Reviewed: unchanged  Carleene Cooper III, MD  03/16/11 1053      Results for orders placed during the hospital encounter of 03/16/11  CBC      Component Value Range   WBC 8.5  4.0 - 10.5 (K/uL)   RBC 4.90  3.87 - 5.11 (MIL/uL)   Hemoglobin 13.1  12.0 - 15.0 (g/dL)   HCT 16.1  09.6 - 04.5 (%)   MCV 76.7 (*) 78.0 - 100.0 (fL)   MCH 26.7  26.0 - 34.0 (pg)   MCHC 34.8  30.0 - 36.0 (g/dL)   RDW 40.9  81.1 - 91.4 (%)   Platelets 509 (*) 150 - 400 (K/uL)  DIFFERENTIAL      Component Value Range   Neutrophils Relative 54  43 - 77 (%)   Neutro Abs 4.6  1.7 - 7.7 (K/uL)   Lymphocytes Relative 38  12 - 46 (%)   Lymphs Abs 3.2  0.7 - 4.0 (K/uL)   Monocytes Relative 6  3 - 12 (%)   Monocytes Absolute 0.5  0.1 - 1.0 (K/uL)   Eosinophils Relative 1  0 - 5 (%)    Eosinophils Absolute 0.1  0.0 - 0.7 (K/uL)   Basophils Relative 0  0 - 1 (%)   Basophils Absolute 0.0  0.0 - 0.1 (K/uL)  COMPREHENSIVE METABOLIC PANEL      Component Value Range   Sodium 138  135 - 145 (mEq/L)   Potassium 3.5  3.5 - 5.1 (mEq/L)   Chloride 100  96 - 112 (mEq/L)   CO2 26  19 - 32 (mEq/L)   Glucose, Bld 100 (*) 70 - 99 (mg/dL)   BUN 7  6 - 23 (mg/dL)   Creatinine, Ser 7.82  0.50 - 1.10 (mg/dL)   Calcium 95.6 (*) 8.4 - 10.5 (mg/dL)   Total Protein 9.4 (*) 6.0 - 8.3 (g/dL)   Albumin 4.6  3.5 - 5.2 (g/dL)   AST 19  0 - 37 (U/L)   ALT 14  0 - 35 (U/L)   Alkaline Phosphatase 98  39 - 117 (U/L)   Total Bilirubin 0.3  0.3 - 1.2 (mg/dL)   GFR calc non Af Amer >90  >90 (mL/min)   GFR calc Af Amer >90  >90 (mL/min)  LIPASE, BLOOD      Component Value Range   Lipase 79 (*) 11 - 59 (U/L)  URINALYSIS, ROUTINE W REFLEX MICROSCOPIC      Component Value Range   Color, Urine YELLOW  YELLOW    Appearance CLEAR  CLEAR    Specific Gravity, Urine 1.010  1.005 - 1.030    pH 7.0  5.0 - 8.0    Glucose, UA NEGATIVE  NEGATIVE (mg/dL)   Hgb urine dipstick NEGATIVE  NEGATIVE    Bilirubin Urine NEGATIVE  NEGATIVE    Ketones, ur NEGATIVE  NEGATIVE (mg/dL)   Protein, ur NEGATIVE  NEGATIVE (mg/dL)  Urobilinogen, UA 0.2  0.0 - 1.0 (mg/dL)   Nitrite NEGATIVE  NEGATIVE    Leukocytes, UA TRACE (*) NEGATIVE   PRO B NATRIURETIC PEPTIDE      Component Value Range   BNP, POC 89.5  0 - 125 (pg/mL)  D-DIMER, QUANTITATIVE      Component Value Range   D-Dimer, Quant 0.42  0.00 - 0.48 (ug/mL-FEU)  PROTIME-INR      Component Value Range   Prothrombin Time 12.7  11.6 - 15.2 (seconds)   INR 0.93  0.00 - 1.49   APTT      Component Value Range   aPTT 32  24 - 37 (seconds)  POCT I-STAT TROPONIN I      Component Value Range   Troponin i, poc 0.01  0.00 - 0.08 (ng/mL)   Comment 3           URINE MICROSCOPIC-ADD ON      Component Value Range   Squamous Epithelial / LPF RARE  RARE    WBC, UA 0-2   <3 (WBC/hpf)   Dg Chest Port 1 View  03/16/2011  *RADIOLOGY REPORT*  Clinical Data: Left chest pain  PORTABLE CHEST - 1 VIEW  Comparison: 09/16/2009  Findings: Cardiomediastinal silhouette is stable.  No acute infiltrate or pleural effusion.  No pulmonary edema.  Bony thorax is stable.  Linear atelectasis or scarring in the right upper lobe.  IMPRESSION: No active disease.  No significant change.  Original Report Authenticated By: Natasha Mead, M.D.   4:00 PM Patient had physical examination and laboratory testing. EKG and troponin was negative. Her d-dimer was negative. Her lipase was mildly elevated at 79. She continues to have some chest pain. I advised her that she would need to be admitted for overnight observation for chest pain.  4:00 PM Case discussed with Dr. Jose Persia.  [facility] will see and admit pt for chest pain observation.   1. Chest pain            Carleene Cooper III, MD 03/16/11 1600

## 2011-03-16 NOTE — ED Notes (Signed)
Pt is aware of poc, meds given per admission orders, remains on monitor, will order meal tray

## 2011-03-16 NOTE — ED Notes (Signed)
Patient states her pain continues as a 7/10, sharp in nature.  FPC in to see patient and 2nd EKG done.

## 2011-03-16 NOTE — ED Notes (Signed)
Pt to ED for eval of left sided chest pain associated with nausea, sob, and vomiting. Pt reports she was at work when she had sudden onset of severe pain to left chest, states it felt like someone punched her. Pt reports pain 7/10 and increases with inspiration. Pt reports she was recently discharged from [facility] due to her pancreatitis. Pt with hx of cva to left side pt reports mild weakness to left side at time but is able to work and do all ALDs on own.

## 2011-03-17 ENCOUNTER — Other Ambulatory Visit (HOSPITAL_COMMUNITY): Payer: BC Managed Care – PPO

## 2011-03-17 ENCOUNTER — Encounter (HOSPITAL_COMMUNITY)
Admission: EM | Disposition: A | Discharge status: Home / Self Care | Payer: Self-pay | Source: Home / Self Care | Attending: Internal Medicine

## 2011-03-17 ENCOUNTER — Encounter (HOSPITAL_COMMUNITY): Payer: Self-pay | Admitting: Interventional Cardiology

## 2011-03-17 ENCOUNTER — Encounter: Payer: BC Managed Care – PPO | Admitting: Internal Medicine

## 2011-03-17 ENCOUNTER — Other Ambulatory Visit: Payer: Self-pay | Admitting: Internal Medicine

## 2011-03-17 HISTORY — PX: CORONARY ANGIOGRAM: SHX5466

## 2011-03-17 LAB — CBC
HCT: 32.3 % — ABNORMAL LOW (ref 36.0–46.0)
Hemoglobin: 10.9 g/dL — ABNORMAL LOW (ref 12.0–15.0)
MCH: 25.9 pg — ABNORMAL LOW (ref 26.0–34.0)
MCH: 26 pg (ref 26.0–34.0)
MCHC: 33.7 g/dL (ref 30.0–36.0)
MCHC: 33.7 g/dL (ref 30.0–36.0)
Platelets: 404 10*3/uL — ABNORMAL HIGH (ref 150–400)
RBC: 3.94 MIL/uL (ref 3.87–5.11)
RDW: 14.5 % (ref 11.5–15.5)
RDW: 14.7 % (ref 11.5–15.5)

## 2011-03-17 LAB — BASIC METABOLIC PANEL
BUN: 10 mg/dL (ref 6–23)
Calcium: 8.9 mg/dL (ref 8.4–10.5)
Chloride: 102 mEq/L (ref 96–112)
Creatinine, Ser: 0.62 mg/dL (ref 0.50–1.10)
Creatinine, Ser: 0.78 mg/dL (ref 0.50–1.10)
GFR calc Af Amer: >90 mL/min (ref >90)
GFR calc non Af Amer: >90 mL/min (ref >90)
Glucose, Bld: 122 mg/dL — ABNORMAL HIGH (ref 70–99)
Sodium: 140 mEq/L (ref 135–145)

## 2011-03-17 LAB — CARDIAC PANEL(CRET KIN+CKTOT+MB+TROPI)
CK, MB: 1.9 ng/mL (ref 0.3–4.0)
CK, MB: 2.1 ng/mL (ref 0.3–4.0)
CK, MB: 2.3 ng/mL (ref 0.3–4.0)
Total CK: 76 U/L (ref 7–177)
Total CK: 74 U/L (ref 7–177)

## 2011-03-17 LAB — CREATININE, SERUM: GFR calc non Af Amer: >90 mL/min (ref >90)

## 2011-03-17 LAB — GLUCOSE, CAPILLARY
Glucose-Capillary: 123 mg/dL — ABNORMAL HIGH (ref 70–99)
Glucose-Capillary: 157 mg/dL — ABNORMAL HIGH (ref 70–99)

## 2011-03-17 LAB — LIPID PANEL
LDL Cholesterol: 124 mg/dL — ABNORMAL HIGH (ref 0–99)
Total CHOL/HDL Ratio: 2.9 RATIO
VLDL: 17 mg/dL (ref 0–40)

## 2011-03-17 LAB — HEMOGLOBIN A1C
Hgb A1c MFr Bld: 6.7 % — ABNORMAL HIGH (ref <5.7)
Mean Plasma Glucose: 146 mg/dL — ABNORMAL HIGH (ref <117)

## 2011-03-17 LAB — POCT ACTIVATED CLOTTING TIME: Activated Clotting Time: 127 seconds

## 2011-03-17 LAB — MRSA PCR SCREENING: MRSA by PCR: POSITIVE — AB

## 2011-03-17 SURGERY — LEFT HEART CATHETERIZATION WITH CORONARY ANGIOGRAM
Anesthesia: LOCAL

## 2011-03-17 SURGERY — CORONARY ANGIOGRAM
Anesthesia: Moderate Sedation | Laterality: Bilateral

## 2011-03-17 MED ORDER — INFLUENZA VIRUS VACC SPLIT PF IM SUSP
0.5000 mL | INTRAMUSCULAR | Status: AC
  Filled 2011-03-17: qty 0.5

## 2011-03-17 MED ORDER — TRAMADOL HCL 50 MG PO TABS
50.0000 mg | ORAL_TABLET | Freq: Once | ORAL | Status: AC
  Administered 2011-03-17: 50 mg via ORAL
  Filled 2011-03-17: qty 1

## 2011-03-17 MED ORDER — MUPIROCIN 2 % EX OINT
1.0000 "application " | TOPICAL_OINTMENT | Freq: Two times a day (BID) | CUTANEOUS | Status: DC
  Administered 2011-03-17 – 2011-03-21 (×8): 1 via NASAL
  Filled 2011-03-17 (×2): qty 22

## 2011-03-17 MED ORDER — HEPARIN (PORCINE) IN NACL 2-0.9 UNIT/ML-% IJ SOLN
INTRAMUSCULAR | Status: AC
  Filled 2011-03-17: qty 2000

## 2011-03-17 MED ORDER — FENTANYL CITRATE 0.05 MG/ML IJ SOLN
INTRAMUSCULAR | Status: AC
  Filled 2011-03-17: qty 2

## 2011-03-17 MED ORDER — CHLORHEXIDINE GLUCONATE CLOTH 2 % EX PADS
6.0000 | MEDICATED_PAD | Freq: Every day | CUTANEOUS | Status: DC
  Administered 2011-03-18: 6 via TOPICAL

## 2011-03-17 MED ORDER — METFORMIN HCL 500 MG PO TABS
1000.0000 mg | ORAL_TABLET | Freq: Two times a day (BID) | ORAL | Status: DC
  Filled 2011-03-17: qty 2

## 2011-03-17 MED ORDER — NITROGLYCERIN 0.2 MG/ML ON CALL CATH LAB
INTRAVENOUS | Status: AC
  Filled 2011-03-17: qty 1

## 2011-03-17 MED ORDER — SODIUM CHLORIDE 0.9 % IJ SOLN: 3.0000 mL | INTRAMUSCULAR | Status: DC | PRN

## 2011-03-17 MED ORDER — MIDAZOLAM HCL 2 MG/2ML IJ SOLN
INTRAMUSCULAR | Status: AC
  Filled 2011-03-17: qty 2

## 2011-03-17 MED ORDER — LIDOCAINE HCL (PF) 1 % IJ SOLN
INTRAMUSCULAR | Status: AC
  Filled 2011-03-17: qty 30

## 2011-03-17 MED ORDER — DIAZEPAM 5 MG PO TABS: 5.0000 mg | ORAL_TABLET | ORAL | Status: DC

## 2011-03-17 MED ORDER — HEPARIN SODIUM (PORCINE) 5000 UNIT/ML IJ SOLN: 5000.0000 [IU] | Freq: Three times a day (TID) | INTRAMUSCULAR | Status: DC

## 2011-03-17 NOTE — Procedures (Signed)
PROCEDURE:  Left heart catheterization with selective coronary angiography, left ventriculogram, abdominal aortogram.  INDICATIONS:  Persistent chest pain; prior abnormal stress test  The risks, benefits, and details of the procedure were explained to the patient.  The patient verbalized understanding and wanted to proceed.  Informed written consent was obtained.  PROCEDURE TECHNIQUE:  After Xylocaine anesthesia a 54F sheath was placed in the right femoral artery with a single anterior needle wall stick.   Left coronary angiography was done using a Judkins L4 guide catheter.  Right coronary angiography was done using a Judkins R4 guide catheter.  Left ventriculography was done using a pigtail catheter.    CONTRAST:  Total of 85 cc.  COMPLICATIONS:  None.    HEMODYNAMICS:  There was no gradient between the left ventricle and aorta.  LV 125/3 ,  LVEDP 8 mm Hg.   Ao 125/76 , mean Ao pressure 97 mm Hg.  ANGIOGRAPHIC DATA:   The left main coronary artery is widely patent.  The left anterior descending artery is a large vessel to the apex and appears widely patent.  The left circumflex artery is a large vessel that is widely patent.  There is a large OM1 which is normal.  There is a medium sized ramus which is widely patent.  The right coronary artery is a large dominant vessel that appears angiographically normal.  Medium sized PDA and PLA are normal.  There was catheter induced spasm in the proximal RCA which resolved with IC NTG through the catheter.  LEFT VENTRICULOGRAM:  Left ventricular angiogram was done in the 30 RAO projection and revealed normal left ventricular wall motion and systolic function with an estimated ejection fraction of 55%.  LVEDP was 8  mmHg.  IMPRESSIONS:  1. Normal left main coronary artery. 2. Normal left anterior descending artery and its branches. 3. Normal left circumflex artery and its branches. 4. Normal right coronary artery. 5. Normal left ventricular  systolic function.  LVEDP 8 mmHg.  Ejection fraction 55%. 6.  Non-cardiac chest pain 7.  Abdominal aortogram showed no AAA.  Dual arterial supply to the left kidney, both vessels patent.  Patent Right renal artery. Patent aortoiliac bifurcation.  RECOMMENDATION:  No CAD.  Continue aggressive risk factor modification.

## 2011-03-17 NOTE — Progress Notes (Signed)
Subjective: Seen by cards today, got cathed this a.m.  Results were clean coronaries, please see cards note for full details.  Pt feels well, no further CP.  Some mild abdominal tenderness today in RUQ.  Objective: Vital signs in last 24 hours: Filed Vitals:   03/17/11 0800 03/17/11 0824 03/17/11 0855 03/17/11 1030  BP: 131/81   136/76  Pulse:  83  72  Temp:    98.3 F (36.8 C)  TempSrc:    Axillary  Resp:    18  Height:      Weight:   110 lb 3.7 oz (50 kg)   SpO2: 100%   97%   Weight change:   Intake/Output Summary (Last 24 hours) at 03/17/11 1220 Last data filed at 03/17/11 0800  Gross per 24 hour  Intake    926 ml  Output    850 ml  Net     76 ml   Physical Exam: Physical Exam GEN: NAD.  Alert and oriented x 3.  Pleasant, conversant, and cooperative to exam.  Lying prone post cath. RESP:  CTAB, no w/r/r CARDIOVASCULAR: RRR, S1, S2, no m/r/g ABDOMEN: soft, NT/ND, NABS EXT: warm and dry. No edema in b/l LE  Lab Results: Basic Metabolic Panel:  Lab 03/17/11 9147 03/16/11 1108  NA 140 138  K 3.8 3.5  CL 105 100  CO2 25 26  GLUCOSE 122* 100*  BUN 8 7  CREATININE 0.62 0.59  CALCIUM 8.9 11.0*  MG -- --  PHOS -- --   Liver Function Tests:  Lab 03/16/11 1108  AST 19  ALT 14  ALKPHOS 98  BILITOT 0.3  PROT 9.4*  ALBUMIN 4.6    Lab 03/16/11 1108  LIPASE 79*  AMYLASE --   No results found for this basename: AMMONIA:2 in the last 168 hours CBC:  Lab 03/17/11 1146 03/17/11 0515 03/16/11 1108  WBC 6.4 6.7 --  NEUTROABS -- -- 4.6  HGB 10.9* 10.2* --  HCT 32.3* 30.3* --  MCV 76.9* 76.9* --  PLT 417* 404* --   Cardiac Enzymes:  Lab 03/17/11 0041 03/16/11 2110  CKTOTAL 82 85  CKMB 1.9 1.9  CKMBINDEX -- --  TROPONINI <0.30 <0.30   BNP:  Lab 03/16/11 1208  POCBNP 89.5   D-Dimer:  Lab 03/16/11 1108  DDIMER 0.42   CBG:  Lab 03/17/11 1148 03/17/11 0916 03/17/11 0740 03/16/11 2200 03/16/11 1927  GLUCAP 116* 123* 125* 215* 90   Hemoglobin  A1C: No results found for this basename: HGBA1C in the last 168 hours Fasting Lipid Panel:  Lab 03/17/11 0515  CHOL 214*  HDL 73  LDLCALC 124*  TRIG 86  CHOLHDL 2.9  LDLDIRECT --   Thyroid Function Tests: No results found for this basename: TSH,T4TOTAL,FREET4,T3FREE,THYROIDAB in the last 168 hours Coagulation:  Lab 03/16/11 1108  LABPROT 12.7  INR 0.93    Micro Results: Recent Results (from the past 240 hour(s))  MRSA PCR SCREENING     Status: Abnormal   Collection Time   03/16/11  9:48 PM      Component Value Range Status Comment   MRSA by PCR POSITIVE (*) NEGATIVE  Final    Studies/Results: Dg Chest Port 1 View  03/16/2011  *RADIOLOGY REPORT*  Clinical Data: Left chest pain  PORTABLE CHEST - 1 VIEW  Comparison: 09/16/2009  Findings: Cardiomediastinal silhouette is stable.  No acute infiltrate or pleural effusion.  No pulmonary edema.  Bony thorax is stable.  Linear atelectasis or scarring in  the right upper lobe.  IMPRESSION: No active disease.  No significant change.  Original Report Authenticated By: Natasha Mead, M.D.   Medications: I have reviewed the patient's current medications. Scheduled Meds:   . aspirin EC  81 mg Oral Daily  . Chlorhexidine Gluconate Cloth  6 each Topical Q0600  . enoxaparin  40 mg Subcutaneous QHS  . fentaNYL      . heparin      . heparin  5,000 Units Subcutaneous Q8H  .  HYDROmorphone (DILAUDID) injection  2 mg Intravenous Once  . influenza  inactive virus vaccine  0.5 mL Intramuscular Tomorrow-1000  . insulin aspart  0-9 Units Subcutaneous TID WC  . lipase/protease/amylase  2 capsule Oral TID AC  . lisinopril  20 mg Oral Daily  . metFORMIN  1,000 mg Oral BID WC  . midazolam      . mupirocin ointment  1 application Nasal BID  . nitroGLYCERIN      . nitroGLYCERIN      . ondansetron (ZOFRAN) IV  4 mg Intravenous Once  . simvastatin  20 mg Oral q1800  . DISCONTD: diazepam  5 mg Oral On Call  . DISCONTD: enoxaparin  40 mg Subcutaneous  Q24H  . DISCONTD: enoxaparin  40 mg Subcutaneous Q24H   Continuous Infusions:   . sodium chloride 80 mL/hr at 03/16/11 1115  . nitroGLYCERIN Stopped (03/17/11 0934)   PRN Meds:.alum & mag hydroxide-simeth, docusate sodium, morphine, nitroGLYCERIN, ondansetron (ZOFRAN) IV, ondansetron, DISCONTD: acetaminophen, DISCONTD: acetaminophen, DISCONTD:  morphine injection, DISCONTD: nitroGLYCERIN, DISCONTD: sodium chloride  Assessment/Plan: #Chest Pain S/p cardiac cath today, showed no CAD.  No further CP.  Lipase was slightly elevated, abdomen mildly tender.  Pancreatic pathology could explain chest pain.  Ddx still includes PE (given prior history, but neg D-dimer, stable vitals), vs muskuloskeletal. - post cath care - recheck lipase  #Abdominal Pain Pt c/o mild RUQ abdominal pain and TTP on exam, which she explains is not out of the ordinary for this pt.  Lipase was slightly elevated.  Will recheck and see how pt tolerates food today. - food today - recheck lipase  #Anemia Pt was anemic today in the 10's.  Has h/o sickle cell trait and beta thalassemia.  Hb slightly higher post cath.  Does have prior Hb's in the 10s.  Will trend and get anemia panel to w/u. - anemia panel - CBC  #Ppx Heparin  LOS: 1 day   WILDMAN-TOBRINER, BEN 03/17/2011, 12:20 PM

## 2011-03-17 NOTE — H&P (Signed)
Internal Medicine Attending Admission Note Date: 03/17/2011  Patient name: [name] Medical record number: [id number] Date of birth: [birth year] Age: 55 y.o. Gender: female  I saw and evaluated the patient. I reviewed the resident's note by Dr. Abner Greenspan and I agree with the resident's findings and plan as documented in the resident's note, with additional comments as noted below.  Chief Complaint(s): Chest pain and shortness of breath  History - key components related to admission: Patient is a 55 year old woman with a history of hypertension, hyperlipidemia, pulmonary embolism in 2006, pancreatic divisum, diabetes mellitus, migraine headaches, sickle cell trait, and CVA admitted with complaint of chest pain and associated shortness of breath which began acutely at work.   Physical Exam - key components related to admission:  Filed Vitals:   03/17/11 0824 03/17/11 0855 03/17/11 1030 03/17/11 1345  BP:   136/76 131/88  Pulse: 83  72 72  Temp:   98.3 F (36.8 C) 98 F (36.7 C)  TempSrc:   Axillary Oral  Resp:   18 18  Height:      Weight:  110 lb 3.7 oz (50 kg)    SpO2:   97% 100%   Gen.: Alert, no acute distress Lungs: Clear Heart: Regular; no extra sounds or murmurs Abdomen: Soft, nontender Extremities: No edema; no calf tenderness  Lab results:   Basic Metabolic Panel:  Basename 03/17/11 1146 03/17/11 0515 03/16/11 1108  NA -- 140 138  K -- 3.8 3.5  CL -- 105 100  CO2 -- 25 26  GLUCOSE -- 122* 100*  BUN -- 8 7  CREATININE 0.60 0.62 --  CALCIUM -- 8.9 11.0*  MG -- -- --  PHOS -- -- --   Liver Function Tests:  Basename 03/16/11 1108  AST 19  ALT 14  ALKPHOS 98  BILITOT 0.3  PROT 9.4*  ALBUMIN 4.6    Basename 03/16/11 1108  LIPASE 79*  AMYLASE --    CBC:  Basename 03/17/11 1146 03/17/11 0515 03/16/11 1108  WBC 6.4 6.7 --  NEUTROABS -- -- 4.6  HGB 10.9* 10.2* --  HCT 32.3* 30.3* --  MCV 76.9* 76.9* --  PLT 417* 404* --   Cardiac  Enzymes:  Basename 03/17/11 1134 03/17/11 0041 03/16/11 2110  CKTOTAL 76 82 85  CKMB 2.1 1.9 1.9  CKMBINDEX -- -- --  TROPONINI <0.30 <0.30 <0.30   BNP:  Basename 03/16/11 1208  POCBNP 89.5   D-Dimer:  Basename 03/16/11 1108  DDIMER 0.42   CBG:  Basename 03/17/11 1148 03/17/11 0916 03/17/11 0740 03/16/11 2200 03/16/11 1927  GLUCAP 116* 123* 125* 215* 90   Hemoglobin A1C:  Basename 03/17/11 0515  HGBA1C 6.7*   Fasting Lipid Panel:  Basename 03/17/11 0515  CHOL 214*  HDL 73  LDLCALC 124*  TRIG 86  CHOLHDL 2.9  LDLDIRECT --   Coagulation:  Basename 03/16/11 1108  INR 0.93    Imaging results:  Dg Chest Port 1 View  03/16/2011  *RADIOLOGY REPORT*  Clinical Data: Left chest pain  PORTABLE CHEST - 1 VIEW  Comparison: 09/16/2009  Findings: Cardiomediastinal silhouette is stable.  No acute infiltrate or pleural effusion.  No pulmonary edema.  Bony thorax is stable.  Linear atelectasis or scarring in the right upper lobe.  IMPRESSION: No active disease.  No significant change.  Original Report Authenticated By: Natasha Mead, M.D.    Other results: EKG: Normal sinus rhythm; prolonged QT.  Assessment & Plan by Problem:  1.  Chest pain and  shortness of breath.  Patient reports some improvement in her chest pain, although it is still present and she is still having associated dyspnea.  A cardiac catheterization today was negative for obstructive coronary disease.  Given her history of pulmonary embolism and the lack of explanation of her chest pain and shortness of breath, plan is chest CT scan to rule out pulmonary embolism.  Will continue to monitor and follow O2 sats.  2.  Hypertension.  Continue home regimen.  3.  Diabetes.  Manage with sliding scale insulin in the acute setting; follow glucose and adjust as indicated.

## 2011-03-17 NOTE — Progress Notes (Signed)
SUBJECTIVE:  Persistent chest pain overnight.  OBJECTIVE:   Vitals:   Filed Vitals:   03/17/11 0400 03/17/11 0500 03/17/11 0600 03/17/11 0700  BP: 107/67 106/67    Pulse:      Temp:    98 F (36.7 C)  TempSrc:    Oral  Resp:      Height:      Weight:      SpO2: 100% 100% 100%    I&O's:   Intake/Output Summary (Last 24 hours) at 03/17/11 0753 Last data filed at 03/17/11 0600  Gross per 24 hour  Intake    763 ml  Output    850 ml  Net    -87 ml   TELEMETRY: Reviewed telemetry pt in NSR:     PHYSICAL EXAM General: Well developed, well nourished, in no acute distress Head: Eyes PERRLA, No xanthomas.   Normal cephalic and atramatic  Lungs:   Clear bilaterally to auscultation and percussion. Heart:   HRRR S1 S2 Pulses are 2+ & equal.             Abdomen: Bowel sounds are positive, abdomen soft and mild tenderness Msk:  Back normal, normal gait. Normal strength and tone for age. Extremities:   No clubbing, cyanosis or edema.  DP +1 Neuro: Alert and oriented X 3. Psych:  Good affect, responds appropriately   LABS: Basic Metabolic Panel:  Basename 03/17/11 0515 03/16/11 1108  NA 140 138  K 3.8 3.5  CL 105 100  CO2 25 26  GLUCOSE 122* 100*  BUN 8 7  CREATININE 0.62 0.59  CALCIUM 8.9 11.0*  MG -- --  PHOS -- --   Liver Function Tests:  [facility] 03/16/11 1108  AST 19  ALT 14  ALKPHOS 98  BILITOT 0.3  PROT 9.4*  ALBUMIN 4.6    Basename 03/16/11 1108  LIPASE 79*  AMYLASE --   CBC:  Basename 03/17/11 0515 03/16/11 1108  WBC 6.7 8.5  NEUTROABS -- 4.6  HGB 10.2* 13.1  HCT 30.3* 37.6  MCV 76.9* 76.7*  PLT 404* 509*   Cardiac Enzymes:  Basename 03/17/11 0041 03/16/11 2110  CKTOTAL 82 85  CKMB 1.9 1.9  CKMBINDEX -- --  TROPONINI <0.30 <0.30   BNP:  Basename 03/16/11 1208  POCBNP 89.5   D-Dimer:  Basename 03/16/11 1108  DDIMER 0.42   Hemoglobin A1C: No results found for this basename: HGBA1C in the last 72 hours Fasting Lipid  Panel:  Basename 03/17/11 0515  CHOL 214*  HDL 73  LDLCALC 124*  TRIG 86  CHOLHDL 2.9  LDLDIRECT --   Thyroid Function Tests: No results found for this basename: TSH,T4TOTAL,FREET3,T3FREE,THYROIDAB in the last 72 hours Anemia Panel: No results found for this basename: VITAMINB12,FOLATE,FERRITIN,TIBC,IRON,RETICCTPCT in the last 72 hours Coag Panel:   Lab Results  Component Value Date   INR 0.93 03/16/2011   INR 1.0 12/11/2007    RADIOLOGY: Dg Chest Port 1 View  03/16/2011  *RADIOLOGY REPORT*  Clinical Data: Left chest pain  PORTABLE CHEST - 1 VIEW  Comparison: 09/16/2009  Findings: Cardiomediastinal silhouette is stable.  No acute infiltrate or pleural effusion.  No pulmonary edema.  Bony thorax is stable.  Linear atelectasis or scarring in the right upper lobe.  IMPRESSION: No active disease.  No significant change.  Original Report Authenticated By: Natasha Mead, M.D.      ASSESSMENT: unstable angina.  PLAN:  Given persistent chest pain and long standing diabetes, need further evaluation.  She had a  nuclear stress in 2007 with small apical defect.  ECG now with some anterior T wave changes.  Risks and benefits of cath explained to patient and husband and they agree.  Also needs LDL lowered to < 100.  All questions answered.  Sharisse Rantz S.  03/17/2011  7:53 AM

## 2011-03-17 NOTE — Consult Note (Signed)
Referring Physician: IM Teaching Service Primary Physician: Saralyn Pilar Primary Cardiologist: None Reason for Consultation: Chest pain  HPI: 55 year old African American female with a past medical history significant for diabetes, hypertension, borderline hyperlipidemia, chronic pancreatitis, and pulmonary embolism who presents in consultation for evaluation and management of chest pain.  Historically, she has never had any previous myocardial infarctions; however she did have a nuclear stress test performed in 2007 which was unable to definitively rule out ischemia; there was a possible small defect seen in apex.   Today, she was in her usual state of health when she developed a sharp pleuritic-type chest pain this morning at work. She noted that the pain was in the center of her chest and" hit her suddenly" and culture to double over. The pain was associated with shortness of breath diaphoresis nausea and radiated to her left side and left arm throughout the day today she has had intermittent bouts of chest pressure she has never had these similar symptoms before. These pains were not relieved with nitroglycerin nontender sleep and has been narcotics. In the emergency department she was given Tylenol for the pain and Zofran for nausea and her symptoms improved.  She denies any pre-recent exertional chest pain dyspnea exertion PND orthopnea or lower extremity edema she denies any tach palpitations or syncope.  She notes chronic fatigue recently.  She denies recent bouts of pancreatitis.   Review of Systems: All systems reviewed and are negative except as mentioned in the history of present illness.    Past Medical History  Diagnosis Date  . Hypertension   . HLD (hyperlipidemia) 2009  . Sickle cell trait   . Migraine headache   . Pulmonary embolus 2006    08/2004 -   Two areas of V/Q mismatch. Findings compatible  with high probability for pulmonary embolus.; pt was on coumadin for 1 year  .  Pancreatic divisum     S/P ERCP with stenting 07/2010 at [facility], then stent removal (08/05/2010)  . Diabetes mellitus 2010    well controlled  . Seizure disorder   . History of migraine headaches   . Sickle cell trait   . Beta thalassemia   . CVA (cerebral infarction) 1993    Per report by patient she had a stroke and prolonged rehab course eventually resolving her right sided weakness, as per her hx obtained 08/2007  . Asthma     childhood asthma  . Shortness of breath   . Cancer     of uterus  . GERD (gastroesophageal reflux disease)   . Pneumonia   . Stroke     1993, very small amount of left sided weakness  . Blood dyscrasia     thalasemia, sickle cell trait  . Anemia     beta thallasemia    Medications Prior to Admission  Medication Dose Route Frequency Provider Last Rate Last Dose  . 0.9 %  sodium chloride infusion   Intravenous Continuous Carleene Cooper III, MD 80 mL/hr at 03/16/11 1115    . acetaminophen (TYLENOL) tablet 650 mg  650 mg Oral Q6H PRN Leodis Sias       Or  . acetaminophen (TYLENOL) suppository 650 mg  650 mg Rectal Q6H PRN Leodis Sias      . alum & mag hydroxide-simeth (MAALOX/MYLANTA) 200-200-20 MG/5ML suspension 30 mL  30 mL Oral Q6H PRN Leodis Sias      . aspirin EC tablet 81 mg  81 mg Oral Daily Christopher Pribula   81 mg  at 03/16/11 2240  . docusate sodium (COLACE) capsule 100 mg  100 mg Oral BID PRN Leodis Sias      . enoxaparin (LOVENOX) injection 40 mg  40 mg Subcutaneous QHS Leodis Sias      . HYDROmorphone (DILAUDID) injection 2 mg  2 mg Intravenous Once Carleene Cooper III, MD   2 mg at 03/16/11 1359  . influenza  inactive virus vaccine (FLUZONE/FLUARIX) injection 0.5 mL  0.5 mL Intramuscular Tomorrow-1000 Bernestine Amass. Traeson Dusza      . insulin aspart (novoLOG) injection 0-9 Units  0-9 Units Subcutaneous TID WC Christopher Pribula      . lipase/protease/amylase (CREON-10/PANCREASE) capsule 2 capsule  2 capsule Oral  TID AC Christopher Pribula      . lisinopril (PRINIVIL,ZESTRIL) tablet 20 mg  20 mg Oral Daily Christopher Pribula   20 mg at 03/16/11 2241  . morphine 2 MG/ML injection 2-4 mg  2-4 mg Intravenous Q4H PRN Christopher Pribula   2 mg at 03/17/11 0105  . nitroGLYCERIN (NITROSTAT) SL tablet 0.4 mg  0.4 mg Sublingual Q5 min PRN Carleene Cooper III, MD   0.4 mg at 03/16/11 2009  . nitroGLYCERIN 0.2 mg/mL in dextrose 5 % infusion  3 mcg/min Intravenous Titrated Michele Ho 1.5 mL/hr at 03/17/11 0100 5 mcg/min at 03/17/11 0100  . nitroGLYCERIN 0.2 mg/mL in dextrose 5 % infusion      9 mL/hr at 03/16/11 2115    . ondansetron (ZOFRAN) 4 MG/2ML injection        4 mg at 03/16/11 1128  . ondansetron (ZOFRAN) injection 4 mg  4 mg Intravenous Once Carleene Cooper III, MD   4 mg at 03/16/11 1359  . ondansetron (ZOFRAN) tablet 4 mg  4 mg Oral Q6H PRN Christopher Pribula       Or  . ondansetron (ZOFRAN) injection 4 mg  4 mg Intravenous Q6H PRN Leodis Sias      . simvastatin (ZOCOR) tablet 20 mg  20 mg Oral q1800 Leodis Sias      . DISCONTD: aspirin chewable tablet 324 mg  324 mg Oral Once Carleene Cooper III, MD      . DISCONTD: enoxaparin (LOVENOX) injection 40 mg  40 mg Subcutaneous Q24H Carrolyn Meiers      . DISCONTD: enoxaparin (LOVENOX) injection 40 mg  40 mg Subcutaneous Q24H Carrolyn Meiers      . DISCONTD: morphine 4 MG/ML injection 4 mg  4 mg Intravenous Q4H PRN Hurman Horn, MD   4 mg at 03/16/11 1858  . DISCONTD: nitroGLYCERIN (NITROGLYN) 2 % ointment 0.5 inch  0.5 inch Topical Q6H PRN Carrolyn Meiers       Medications Prior to Admission  Medication Sig Dispense Refill  . amLODipine (NORVASC) 5 MG tablet Take 1 tablet (5 mg total) by mouth daily.  90 tablet  2  . calcium carbonate (TUMS - DOSED IN MG ELEMENTAL CALCIUM) 500 MG chewable tablet Chew 1 tablet by mouth 2 (two) times daily as needed.        Marland Kitchen HYDROcodone-acetaminophen (NORCO) 5-325 MG per tablet Take 1 tablet by mouth every 8 (eight) hours as  needed. Please do not fill until 01/27/11  90 tablet  0  . lipase/protease/amylase (CREON) 12000 UNITS CPEP Take 2 capsules by mouth 3 (three) times daily before meals. Take 1 capsule before snacks.  810 capsule  4  . lisinopril (PRINIVIL,ZESTRIL) 20 MG tablet Take 1 tablet (20 mg total) by mouth daily.  90 tablet  1  .  loperamide (IMODIUM A-D) 2 MG tablet Take 2 mg by mouth as needed. For diarrhea - not more than 6 tablets a day       . metFORMIN (GLUCOPHAGE) 1000 MG tablet Take 1 tablet (1,000 mg total) by mouth 2 (two) times daily with a meal.  180 tablet  4  . ondansetron (ZOFRAN) 4 MG tablet Take 4 mg by mouth Every 6 hours as needed. Nausea            . aspirin EC  81 mg Oral Daily  . enoxaparin  40 mg Subcutaneous QHS  .  HYDROmorphone (DILAUDID) injection  2 mg Intravenous Once  . influenza  inactive virus vaccine  0.5 mL Intramuscular Tomorrow-1000  . insulin aspart  0-9 Units Subcutaneous TID WC  . lipase/protease/amylase  2 capsule Oral TID AC  . lisinopril  20 mg Oral Daily  . nitroGLYCERIN      . ondansetron      . ondansetron (ZOFRAN) IV  4 mg Intravenous Once  . simvastatin  20 mg Oral q1800  . DISCONTD: aspirin  324 mg Oral Once  . DISCONTD: enoxaparin  40 mg Subcutaneous Q24H  . DISCONTD: enoxaparin  40 mg Subcutaneous Q24H    Infusions:    . sodium chloride 80 mL/hr at 03/16/11 1115  . nitroGLYCERIN 5 mcg/min (03/17/11 0100)    Allergies  Allergen Reactions  . Butalbital-Apap-Caffeine Other (See Comments)    unknown  . Shrimp Flavor Other (See Comments)    unknown  . Sulfonamide Derivatives Other (See Comments)    unknown    History   Social History  . Marital Status: Married    Spouse Name: N/A    Number of Children: N/A  . Years of Education: N/A   Occupational History  . Not on file.   Social History Main Topics  . Smoking status: Former Smoker -- 0.5 packs/day for 30 years    Quit date: 05/02/2000  . Smokeless tobacco: Not on file  .  Alcohol Use: No  . Drug Use: No  . Sexually Active: Yes   Other Topics Concern  . Not on file   Social History Narrative   Works at [employer] as a Merchandiser, retail, on Health visitor about 9 hours/ day    Family History  Problem Relation Age of Onset  . Prostate cancer Father   . Sickle cell anemia Brother   . Lung cancer Maternal Aunt   . Lung cancer Maternal Uncle   . Breast cancer Maternal Grandmother   There is a positive family history of premature CAD (son with MI at 25).  PHYSICAL EXAM: Filed Vitals:   03/17/11 0139  BP: 118/76  Temp:   Resp:      Intake/Output Summary (Last 24 hours) at 03/17/11 0210 Last data filed at 03/17/11 0100  Gross per 24 hour  Intake    354 ml  Output    500 ml  Net   -146 ml    General:  Well appearing. No respiratory difficulty HEENT: normal Neck: supple. no JVD. Carotids 2+ bilat; no bruits. No lymphadenopathy or thryomegaly appreciated. Cor: PMI nondisplaced. Regular rate & rhythm. No rubs, gallops or murmurs. Lungs: clear.  Tender to palpitation on exam.  Abdomen: soft, nontender, nondistended. No hepatosplenomegaly. No bruits or masses. Good bowel sounds. Extremities: no cyanosis, clubbing, rash, edema Neuro: alert & oriented x 3, cranial nerves grossly intact. moves all 4 extremities w/o difficulty. Affect pleasant.  ECG: Sinus rhythm, nonspecific T-wave  changes in V1-V3 that are most promient in V2.  No diagnostic STE.   Results for orders placed during the hospital encounter of 03/16/11 (from the past 24 hour(s))  CBC     Status: Abnormal   Collection Time   03/16/11 11:08 AM      Component Value Range   WBC 8.5  4.0 - 10.5 (K/uL)   RBC 4.90  3.87 - 5.11 (MIL/uL)   Hemoglobin 13.1  12.0 - 15.0 (g/dL)   HCT 16.1  09.6 - 04.5 (%)   MCV 76.7 (*) 78.0 - 100.0 (fL)   MCH 26.7  26.0 - 34.0 (pg)   MCHC 34.8  30.0 - 36.0 (g/dL)   RDW 40.9  81.1 - 91.4 (%)   Platelets 509 (*) 150 - 400 (K/uL)  DIFFERENTIAL     Status: Normal    Collection Time   03/16/11 11:08 AM      Component Value Range   Neutrophils Relative 54  43 - 77 (%)   Neutro Abs 4.6  1.7 - 7.7 (K/uL)   Lymphocytes Relative 38  12 - 46 (%)   Lymphs Abs 3.2  0.7 - 4.0 (K/uL)   Monocytes Relative 6  3 - 12 (%)   Monocytes Absolute 0.5  0.1 - 1.0 (K/uL)   Eosinophils Relative 1  0 - 5 (%)   Eosinophils Absolute 0.1  0.0 - 0.7 (K/uL)   Basophils Relative 0  0 - 1 (%)   Basophils Absolute 0.0  0.0 - 0.1 (K/uL)  COMPREHENSIVE METABOLIC PANEL     Status: Abnormal   Collection Time   03/16/11 11:08 AM      Component Value Range   Sodium 138  135 - 145 (mEq/L)   Potassium 3.5  3.5 - 5.1 (mEq/L)   Chloride 100  96 - 112 (mEq/L)   CO2 26  19 - 32 (mEq/L)   Glucose, Bld 100 (*) 70 - 99 (mg/dL)   BUN 7  6 - 23 (mg/dL)   Creatinine, Ser 7.82  0.50 - 1.10 (mg/dL)   Calcium 95.6 (*) 8.4 - 10.5 (mg/dL)   Total Protein 9.4 (*) 6.0 - 8.3 (g/dL)   Albumin 4.6  3.5 - 5.2 (g/dL)   AST 19  0 - 37 (U/L)   ALT 14  0 - 35 (U/L)   Alkaline Phosphatase 98  39 - 117 (U/L)   Total Bilirubin 0.3  0.3 - 1.2 (mg/dL)   GFR calc non Af Amer >90  >90 (mL/min)   GFR calc Af Amer >90  >90 (mL/min)  LIPASE, BLOOD     Status: Abnormal   Collection Time   03/16/11 11:08 AM      Component Value Range   Lipase 79 (*) 11 - 59 (U/L)  D-DIMER, QUANTITATIVE     Status: Normal   Collection Time   03/16/11 11:08 AM      Component Value Range   D-Dimer, Quant 0.42  0.00 - 0.48 (ug/mL-FEU)  PROTIME-INR     Status: Normal   Collection Time   03/16/11 11:08 AM      Component Value Range   Prothrombin Time 12.7  11.6 - 15.2 (seconds)   INR 0.93  0.00 - 1.49   APTT     Status: Normal   Collection Time   03/16/11 11:08 AM      Component Value Range   aPTT 32  24 - 37 (seconds)  POCT I-STAT TROPONIN I     Status:  Normal   Collection Time   03/16/11 11:38 AM      Component Value Range   Troponin i, poc 0.01  0.00 - 0.08 (ng/mL)   Comment 3           URINALYSIS, ROUTINE W  REFLEX MICROSCOPIC     Status: Abnormal   Collection Time   03/16/11 11:58 AM      Component Value Range   Color, Urine YELLOW  YELLOW    Appearance CLEAR  CLEAR    Specific Gravity, Urine 1.010  1.005 - 1.030    pH 7.0  5.0 - 8.0    Glucose, UA NEGATIVE  NEGATIVE (mg/dL)   Hgb urine dipstick NEGATIVE  NEGATIVE    Bilirubin Urine NEGATIVE  NEGATIVE    Ketones, ur NEGATIVE  NEGATIVE (mg/dL)   Protein, ur NEGATIVE  NEGATIVE (mg/dL)   Urobilinogen, UA 0.2  0.0 - 1.0 (mg/dL)   Nitrite NEGATIVE  NEGATIVE    Leukocytes, UA TRACE (*) NEGATIVE   URINE MICROSCOPIC-ADD ON     Status: Normal   Collection Time   03/16/11 11:58 AM      Component Value Range   Squamous Epithelial / LPF RARE  RARE    WBC, UA 0-2  <3 (WBC/hpf)  PRO B NATRIURETIC PEPTIDE     Status: Normal   Collection Time   03/16/11 12:08 PM      Component Value Range   BNP, POC 89.5  0 - 125 (pg/mL)  GLUCOSE, CAPILLARY     Status: Normal   Collection Time   03/16/11  7:27 PM      Component Value Range   Glucose-Capillary 90  70 - 99 (mg/dL)  CARDIAC PANEL(CRET KIN+CKTOT+MB+TROPI)     Status: Normal   Collection Time   03/16/11  9:10 PM      Component Value Range   Total CK 85  7 - 177 (U/L)   CK, MB 1.9  0.3 - 4.0 (ng/mL)   Troponin I <0.30  <0.30 (ng/mL)   Relative Index RELATIVE INDEX IS INVALID  0.0 - 2.5   GLUCOSE, CAPILLARY     Status: Abnormal   Collection Time   03/16/11 10:00 PM      Component Value Range   Glucose-Capillary 215 (*) 70 - 99 (mg/dL)   Dg Chest Port 1 View  03/16/2011  *RADIOLOGY REPORT*  Clinical Data: Left chest pain  PORTABLE CHEST - 1 VIEW  Comparison: 09/16/2009  Findings: Cardiomediastinal silhouette is stable.  No acute infiltrate or pleural effusion.  No pulmonary edema.  Bony thorax is stable.  Linear atelectasis or scarring in the right upper lobe.  IMPRESSION: No active disease.  No significant change.  Original Report Authenticated By: Natasha Mead, M.D.   ASSESSMENT / PLAN /  DISCUSSION: 55 year old African female with a past medical history significant for diabetes, hypertension, and borderline hyperlipidemia, who presents in consultation for evaluation and management of chest pain.   #1 Chest pain.  There multiple etiologies for her chest pain. I suspect this is likely noncardiac in etiology. While she does have some nonspecific EKG changes in the anterior leads. This could be a normal appearing and in this female. However, she does have risk factors, including diabetes, and hypertension, as well as a significant family history and history of tobacco use. Because of this I feel that she would benefit from a noninvasive ischemia evaluation. I think that she she should undergo a nuclear stress test tomorrow. Either an exercise  nuclear test or an adenosine nuclear stress test. In the interim time we will treat her with aspirin, statin therapy, and IV nitroglycerin, which she is currently taking. We will not start a beta blocker because we may have her perform an exercise test in the morning. We'll cycle cardiac enzymes, rule out myocardial infarction. It should be noted that she has tenderness to palpation on examination of her chest wall. Should her ischemia evaluation be negative, then I think that she would benefit from anti-inflammatory therapy.  #2 Hypertension. Her blood pressure is controlled with a nitroglycerin drip. Will continue her home medications.   #3 Borderline hyperlipidemia. We will check a fasting lipid profile.  [facility] cardiology will follow along in consultation.  Thank you for allowing [name] to participate in the patient's care.

## 2011-03-18 ENCOUNTER — Inpatient Hospital Stay (HOSPITAL_COMMUNITY): Payer: BC Managed Care – PPO

## 2011-03-18 ENCOUNTER — Encounter (HOSPITAL_COMMUNITY): Payer: Self-pay | Admitting: Radiology

## 2011-03-18 DIAGNOSIS — R079 Chest pain, unspecified: Secondary | ICD-10-CM

## 2011-03-18 LAB — IRON AND TIBC
Iron: 14 ug/dL — ABNORMAL LOW (ref 42–135)
UIBC: 107 ug/dL — ABNORMAL LOW (ref 125–400)

## 2011-03-18 LAB — RETICULOCYTES: Retic Count, Absolute: 44.1 10*3/uL (ref 19.0–186.0)

## 2011-03-18 LAB — URINE CULTURE: Colony Count: 55000

## 2011-03-18 LAB — CBC
HCT: 19.5 % — ABNORMAL LOW (ref 36.0–46.0)
HCT: 28.6 % — ABNORMAL LOW (ref 36.0–46.0)
HCT: 31.4 % — ABNORMAL LOW (ref 36.0–46.0)
Hemoglobin: 8.8 g/dL — ABNORMAL LOW (ref 12.0–15.0)
Hemoglobin: 6.5 g/dL — CL (ref 12.0–15.0)
Hemoglobin: 9.6 g/dL — ABNORMAL LOW (ref 12.0–15.0)
Hemoglobin: 11 g/dL — ABNORMAL LOW (ref 12.0–15.0)
MCH: 25.7 pg — ABNORMAL LOW (ref 26.0–34.0)
MCH: 26.2 pg (ref 26.0–34.0)
MCH: 25.6 pg — ABNORMAL LOW (ref 26.0–34.0)
MCHC: 33.3 g/dL (ref 30.0–36.0)
MCHC: 33.6 g/dL (ref 30.0–36.0)
MCHC: 33.1 g/dL (ref 30.0–36.0)
MCV: 77.1 fL — ABNORMAL LOW (ref 78.0–100.0)
MCV: 77.1 fL — ABNORMAL LOW (ref 78.0–100.0)
MCV: 77.1 fL — ABNORMAL LOW (ref 78.0–100.0)
Platelets: 296 10*3/uL (ref 150–400)
RBC: 3.39 MIL/uL — ABNORMAL LOW (ref 3.87–5.11)
RBC: 4.2 MIL/uL (ref 3.87–5.11)
RDW: 14.5 % (ref 11.5–15.5)
WBC: 5 10*3/uL (ref 4.0–10.5)
WBC: 5.9 10*3/uL (ref 4.0–10.5)

## 2011-03-18 LAB — LACTATE DEHYDROGENASE: LDH: 181 U/L (ref 94–250)

## 2011-03-18 LAB — FIBRINOGEN: Fibrinogen: 228 mg/dL (ref 204–475)

## 2011-03-18 LAB — BASIC METABOLIC PANEL
BUN: 5 mg/dL — ABNORMAL LOW (ref 6–23)
CO2: 19 mEq/L (ref 19–32)
Calcium: 6 mg/dL — CL (ref 8.4–10.5)
Calcium: 4.6 mg/dL — CL (ref 8.4–10.5)
Chloride: 104 mEq/L (ref 96–112)
Creatinine, Ser: 0.31 mg/dL — ABNORMAL LOW (ref 0.50–1.10)
GFR calc Af Amer: >90 mL/min (ref >90)
GFR calc non Af Amer: >90 mL/min (ref >90)
Glucose, Bld: 102 mg/dL — ABNORMAL HIGH (ref 70–99)
Glucose, Bld: 124 mg/dL — ABNORMAL HIGH (ref 70–99)
Potassium: 2.5 mEq/L — CL (ref 3.5–5.1)
Potassium: 4 mEq/L (ref 3.5–5.1)
Sodium: 142 mEq/L (ref 135–145)
Sodium: 143 mEq/L (ref 135–145)

## 2011-03-18 LAB — FERRITIN: Ferritin: 25 ng/mL (ref 10–291)

## 2011-03-18 LAB — GLUCOSE, CAPILLARY: Glucose-Capillary: 100 mg/dL — ABNORMAL HIGH (ref 70–99)

## 2011-03-18 LAB — LACTIC ACID, PLASMA: Lactic Acid, Venous: 0.8 mmol/L (ref 0.5–2.2)

## 2011-03-18 MED ORDER — POTASSIUM CHLORIDE CRYS ER 20 MEQ PO TBCR
40.0000 meq | EXTENDED_RELEASE_TABLET | ORAL | Status: DC
  Administered 2011-03-18: 40 meq via ORAL
  Filled 2011-03-18: qty 2

## 2011-03-18 MED ORDER — HYDROMORPHONE HCL PF 1 MG/ML IJ SOLN
0.5000 mg | Freq: Once | INTRAMUSCULAR | Status: AC
  Administered 2011-03-18: 0.5 mg via INTRAVENOUS

## 2011-03-18 MED ORDER — DIPHENHYDRAMINE HCL 25 MG PO CAPS
25.0000 mg | ORAL_CAPSULE | Freq: Once | ORAL | Status: AC
  Administered 2011-03-18: 25 mg via ORAL
  Filled 2011-03-18: qty 1

## 2011-03-18 MED ORDER — IOHEXOL 300 MG/ML  SOLN
100.0000 mL | Freq: Once | INTRAMUSCULAR | Status: AC | PRN
  Administered 2011-03-18: 100 mL via INTRAVENOUS

## 2011-03-18 MED ORDER — ACETAMINOPHEN 325 MG PO TABS
650.0000 mg | ORAL_TABLET | Freq: Once | ORAL | Status: AC
  Administered 2011-03-18: 650 mg via ORAL
  Filled 2011-03-18: qty 2

## 2011-03-18 MED ORDER — POTASSIUM CHLORIDE 10 MEQ/100ML IV SOLN
10.0000 meq | INTRAVENOUS | Status: DC
  Filled 2011-03-18 (×4): qty 100

## 2011-03-18 MED ORDER — HYDROMORPHONE HCL PF 1 MG/ML IJ SOLN
INTRAMUSCULAR | Status: AC
  Administered 2011-03-18: 0.5 mg via INTRAVENOUS
  Filled 2011-03-18: qty 1

## 2011-03-18 MED ORDER — MORPHINE SULFATE 4 MG/ML IJ SOLN
6.0000 mg | Freq: Once | INTRAMUSCULAR | Status: AC
  Administered 2011-03-18: 6 mg via INTRAVENOUS
  Filled 2011-03-18: qty 2

## 2011-03-18 MED ORDER — CHLORHEXIDINE GLUCONATE CLOTH 2 % EX PADS
6.0000 | MEDICATED_PAD | Freq: Every day | CUTANEOUS | Status: DC
  Administered 2011-03-19 – 2011-03-21 (×2): 6 via TOPICAL

## 2011-03-18 NOTE — Progress Notes (Signed)
Notified Dr. Tonny Branch of critical lab values of K: 2.5 and Ca: 6.0.

## 2011-03-18 NOTE — Progress Notes (Signed)
Critical Value: Hgb: 6.5

## 2011-03-18 NOTE — Progress Notes (Signed)
Pt back from CT and report received of right AC extravasation of IV contrast during CT. Pt has been assessed. Right arm is swollen and tender to touch. Pt has normal capillary refill and pulses to right extremity. Pt has been reassured, extremity elevated, and ice pack applied. Will monitor frequently.

## 2011-03-18 NOTE — Progress Notes (Signed)
Internal Medicine Attending  Date: 03/18/2011  Patient name: [name] Medical record number: [id number] Date of birth: [birth year] Age: 55 y.o. Gender: female  I saw and evaluated the patient; see resident note by Dr. Abner Greenspan for details of clinical findings and plans.  Today patient reports ongoing chest pain with shortness of breath and also complains of left upper quadrant pain.  Exam is notable for moderate distress; clear lungs; regular rhythm, no extra sounds or murmurs; abdomen soft, bowel sounds present, with moderate left upper quadrant and left abdominal tenderness; there is minimal tenderness with no palpable mass at the right femoral catheter site; no lower extremity edema.  Labs this morning were concerning for markedly abnormal values including a hemoglobin of 6.5, potassium of 2.1, and bicarbonate of 15; decision was made to transfer to intensive care unit and to obtain noncontrasted abdominal CT scan to rule out retroperitoneal hemorrhage and assess left upper quadrant pain, and initially to transfuse and replace potassium.  A subsequent CBC showed a hemoglobin of 9.6, which raises the question of whether the earlier samples were diluted and therefore not accurate values.  Plan is to repeat the metabolic panel and follow her labs closely; hold off on transfusion or potassium replacement pending followup blood work; monitor closely in ICU setting; get abdominal CT scan to rule out retroperitoneal bleed.  Patient carries a label of sickle cell trait and beta thalassemia trait in prior records; we reviewed her past hemoglobin electrophoresis results and these appear to be consistent with sickle cell trait alone; would consult hematology regarding whether her hemoglobinopathy could be contributing to her current symptoms.  A chest CT scan, although there was some motion artifact, did not show pulmonary embolism; patient had contrast extravasation into her right arm, and we will  continue local care and symptomatic treatment of this.  Change of attending: Dr. Josem Kaufmann will cover as attending physician this weekend, and Dr. Blanch Media will take over as attending physician on Monday, November 19.

## 2011-03-18 NOTE — Progress Notes (Signed)
Subjective: Got CTA yesterday, IV infiltrated and contrast extravasated into R arm.  Pt in some pain but doing well. Objective: Vital signs in last 24 hours: Filed Vitals:   03/17/11 1030 03/17/11 1345 03/17/11 2138 03/18/11 0623  BP: 136/76 131/88 132/78 116/77  Pulse: 72 72 74 65  Temp: 98.3 F (36.8 C) 98 F (36.7 C) 97.6 F (36.4 C) 97.6 F (36.4 C)  TempSrc: Axillary Oral Oral Oral  Resp: 18 18 20 20   Height:      Weight:      SpO2: 97% 100% 97% 97%   Weight change: 3.5 oz (0.1 kg)  Intake/Output Summary (Last 24 hours) at 03/18/11 0819 Last data filed at 03/18/11 0635  Gross per 24 hour  Intake 1994.67 ml  Output    400 ml  Net 1594.67 ml   Physical Exam: Physical Exam GEN: NAD.  Alert and oriented x 3.  Pleasant, conversant, and cooperative to exam. R arm: marked swelling of the medial arm, mild TTP.  No erythema or ecchymossis. RESP:  CTAB, no w/r/r CARDIOVASCULAR: RRR, S1, S2, no m/r/g ABDOMEN: soft, mild TTP in LUQ improved from yesterday, NABS EXT: warm and dry. No edema in b/l LE  Lab Results: Basic Metabolic Panel:  Lab 03/18/11 1610 03/17/11 2212  NA 142 138  K PENDING 3.9  CL 116* 102  CO2 19 28  GLUCOSE 102* 129*  BUN 7 10  CREATININE 0.43* 0.78  CALCIUM PENDING 9.6  MG -- --  PHOS -- --   Liver Function Tests:  Lab 03/16/11 1108  AST 19  ALT 14  ALKPHOS 98  BILITOT 0.3  PROT 9.4*  ALBUMIN 4.6    Lab 03/18/11 0610 03/16/11 1108  LIPASE 37 79*  AMYLASE -- --   No results found for this basename: AMMONIA:2 in the last 168 hours CBC:  Lab 03/18/11 0610 03/17/11 1146 03/16/11 1108  WBC 5.0 6.4 --  NEUTROABS -- -- 4.6  HGB 8.8* 10.9* --  HCT 26.2* 32.3* --  MCV 77.3* 76.9* --  PLT 296 417* --   Cardiac Enzymes:  Lab 03/17/11 1642 03/17/11 1134 03/17/11 0041  CKTOTAL 74 76 82  CKMB 2.3 2.1 1.9  CKMBINDEX -- -- --  TROPONINI <0.30 <0.30 <0.30   BNP:  Lab 03/16/11 1208  POCBNP 89.5   D-Dimer:  Lab 03/16/11 1108    DDIMER 0.42   CBG:  Lab 03/18/11 0741 03/17/11 2102 03/17/11 1652 03/17/11 1148 03/17/11 0916 03/17/11 0740  GLUCAP 143* 127* 157* 116* 123* 125*   Hemoglobin A1C:  Lab 03/17/11 0515  HGBA1C 6.7*   Fasting Lipid Panel:  Lab 03/17/11 0515  CHOL 214*  HDL 73  LDLCALC 124*  TRIG 86  CHOLHDL 2.9  LDLDIRECT --   Thyroid Function Tests: No results found for this basename: TSH,T4TOTAL,FREET4,T3FREE,THYROIDAB in the last 168 hours Coagulation:  Lab 03/16/11 1108  LABPROT 12.7  INR 0.93   Anemia Panel:  Lab 03/18/11 0610  VITAMINB12 --  FOLATE --  FERRITIN --  TIBC --  IRON --  RETICCTPCT 1.3    Micro Results: Recent Results (from the past 240 hour(s))  URINE CULTURE     Status: Normal (Preliminary result)   Collection Time   03/16/11 11:58 AM      Component Value Range Status Comment   Specimen Description URINE, CLEAN CATCH   Final    Special Requests NONE   Final    Setup Time [id number]   Final  Colony Count 55,000 COLONIES/ML   Final    Culture     Final    Value: STAPHYLOCOCCUS AUREUS     Note: RIFAMPIN AND GENTAMICIN SHOULD NOT BE USED AS SINGLE DRUGS FOR TREATMENT OF STAPH INFECTIONS.   Report Status PENDING   Incomplete   MRSA PCR SCREENING     Status: Abnormal   Collection Time   03/16/11  9:48 PM      Component Value Range Status Comment   MRSA by PCR POSITIVE (*) NEGATIVE  Final    Studies/Results: Dg Elbow 2 Views Right  03/18/2011  *RADIOLOGY REPORT*  Clinical Data: 55 year old female status post IV extravasation into the right antecubital fossa, question iodinated contrast versus saline chaser.  RIGHT ELBOW - 2 VIEW  Comparison: None.  Findings: AP view of the right elbow demonstrate a significant volume of contrast extravasation, estimated to be at least 25 mL.  IMPRESSION: Iodinated contrast extravasation confirmed into the right antecubital fossa, at least 25 ml suspected.  Original Report Authenticated By: Harley Hallmark, M.D.   Ct  Angio Chest W/cm &/or Wo Cm  03/18/2011  *RADIOLOGY REPORT*  Clinical Data:  Chest pain and shortness of breath for 1 day.  CT ANGIOGRAPHY CHEST WITH CONTRAST  Technique:  Multidetector CT imaging of the chest was performed using the standard protocol during bolus administration of intravenous contrast.  Multiplanar CT image reconstructions including MIPs were obtained to evaluate the vascular anatomy.  At least 25 mL of contrast extravasated into the patient's right antecubital fossa; this was discussed with the patient's clinical team, and evaluated by the radiologist.  Contrast: OMNIPAQUE IOHEXOL 300 MG/ML IV SOLN  Comparison:  Chest radiograph performed 03/16/2011, and CTA of the chest performed 04/14/2009  Findings:  There is no evidence of central pulmonary embolus. Evaluation for pulmonary embolus is significantly suboptimal due to motion artifact.  There is a single small 3 mm nodule noted in the periphery of the right upper lobe (image 43 of 85).  The additional smaller pulmonary nodule previously characterized in the right upper lobe is not well seen due to motion artifact.  The lungs are otherwise clear.  There is no evidence of significant focal consolidation, pleural effusion or pneumothorax.  No masses are identified; no abnormal focal contrast enhancement is seen.  The mediastinum is unremarkable in appearance.  There is no evidence of mediastinal lymphadenopathy.  No pericardial effusion seen.  The great vessels are within normal limits.  No axillary lymphadenopathy is seen.  The visualized portions of the thyroid gland are unremarkable in appearance.  The visualized portions of the liver and spleen are unremarkable. The visualized portions of the pancreas, gallbladder, stomach, adrenal glands and kidneys are within normal limits.  No acute osseous abnormalities are seen.  Small lucencies within the thoracic spine are stable from prior studies.  Review of the MIP images confirms the above  findings.  IMPRESSION:  1.  No evidence of central pulmonary embolus; evaluation for pulmonary embolus is significantly suboptimal due to motion artifact. 2.  Stable benign small right upper lobe pulmonary nodule noted; lungs otherwise clear.  Original Report Authenticated By: Tonia Ghent, M.D.   Dg Chest Port 1 View  03/16/2011  *RADIOLOGY REPORT*  Clinical Data: Left chest pain  PORTABLE CHEST - 1 VIEW  Comparison: 09/16/2009  Findings: Cardiomediastinal silhouette is stable.  No acute infiltrate or pleural effusion.  No pulmonary edema.  Bony thorax is stable.  Linear atelectasis or scarring in the right  upper lobe.  IMPRESSION: No active disease.  No significant change.  Original Report Authenticated By: Natasha Mead, M.D.   Medications: I have reviewed the patient's current medications. Scheduled Meds:   . aspirin EC  81 mg Oral Daily  . Chlorhexidine Gluconate Cloth  6 each Topical Q0600  . fentaNYL      . heparin      . heparin  5,000 Units Subcutaneous Q8H  . influenza  inactive virus vaccine  0.5 mL Intramuscular Tomorrow-1000  . insulin aspart  0-9 Units Subcutaneous TID WC  . lidocaine      . lipase/protease/amylase  2 capsule Oral TID AC  . lisinopril  20 mg Oral Daily  . metFORMIN  1,000 mg Oral BID WC  . midazolam      . mupirocin ointment  1 application Nasal BID  . nitroGLYCERIN      . nitroGLYCERIN      . simvastatin  20 mg Oral q1800  . traMADol  50 mg Oral Once  . DISCONTD: diazepam  5 mg Oral On Call  . DISCONTD: enoxaparin  40 mg Subcutaneous QHS   Continuous Infusions:   . sodium chloride 80 mL/hr at 03/18/11 0504  . nitroGLYCERIN Stopped (03/17/11 0934)   PRN Meds:.alum & mag hydroxide-simeth, docusate sodium, iohexol, morphine, nitroGLYCERIN, ondansetron (ZOFRAN) IV, ondansetron, DISCONTD: acetaminophen, DISCONTD: acetaminophen, DISCONTD: sodium chloride  Assessment/Plan: #Chest Pain  S/p cardiac cath, showed no CAD. No further CP. Lipase normal today.   CTA was negative though limited by motion artifact. - con't to follow  #Abdominal Pain  Pt c/o mild RUQ abdominal pain and TTP on exam, which she explains is not out of the ordinary for this pt. Lipase normal this a.m.  Pt tolerated food well yesterday.   #Anemia  Hb dropped further this am to 8.8   was anemic today in the 10's. Has h/o sickle cell trait and beta thalassemia. Hb slightly higher post cath. Does have prior Hb's in the 10s. Will trend and get anemia panel to w/u.  - anemia panel  - CBC   #Ppx  Heparin  LOS: 1 day    UPDATE 12:00pm Pt Hb found to be 6.2 with worsening abdominal tenderness.  Concern for retroperitoneal bleed.  Was acidotic and hypokalemic as well.  Transferred to ICU, stat CT ordered.  Repeat Hb showed 9.6.  Repeat BMET pending.  Very confusing labs in the setting of cath, sickle cell trait, and abdominal pain.  Given IV infiltration after CTA yesterday, perhaps labs were drawn from bad arm/IV. - transfer to ICU - Q6 CBC - f/u stat bmet - stat abdominal noncon CT - lactate level - hemolysis w/u - replete K if needed - consider ABG - transfuse if necessary   LOS: 2 days   [facility], BEN 03/18/2011, 8:19 AM

## 2011-03-18 NOTE — Progress Notes (Signed)
Spoke with CCM physician re: patient's pain. Order received.

## 2011-03-18 NOTE — Consults (Signed)
Chief Complaint  Patient presents with  . Chest Pain   Referring physician: Margarito Liner Reason for consult: Anemia  HISTORY of PRESENT ILLNESS:  [name] is a 55 y.o. female admitted on 03/16/2011 with Chest pain.  She was evaluated by cardiology.  She had cardiac cath 11/15 which was essentially normal.  CT chest was negative for PE.  Her hemoglobin on admission was 13.1.  She c/o RLQ pain after cardiac cath.  Follow up hemoglobin showed drop to 6.5.  She was also noted to have non-anion gap acidosis and hypokalemia on chemistry.  As a result she was transferred to ICU and PCCM consulted for closed ICU.  On arrival to ICU repeat Hb was 9.6.  She c/o RLQ abd pain.  She also feels nauseous.  She denies dyspnea, light-headedness, or palpitations.  She has maintained her blood pressure.  She still has intermittent chest pain.  Line/tubes: PIV  Best practice/protocol: SCD's  Consults: Cardiology  Test/events: 11/15 Cardiac cath>>negative for CAD 11/16 CT chest>>no PE, stable RUL pulmonary nodule   Past Medical History  Diagnosis Date  . Hypertension   . HLD (hyperlipidemia) 2009  . Sickle cell trait   . Migraine headache   . Pulmonary embolus 2006    08/2004 -   Two areas of V/Q mismatch. Findings compatible  with high probability for pulmonary embolus.; pt was on coumadin for 1 year  . Pancreatic divisum     S/P ERCP with stenting 07/2010 at [facility], then stent removal (08/05/2010)  . Diabetes mellitus 2010    well controlled  . Seizure disorder   . History of migraine headaches   . Anemia     beta thallasemia  . Beta thalassemia   . CVA (cerebral infarction) 1993    Per report by patient she had a stroke and prolonged rehab course eventually resolving her right sided weakness, as per her hx obtained 08/2007  . Asthma     childhood asthma  . Shortness of breath   . Cancer     of uterus  . GERD (gastroesophageal reflux disease)   . Pneumonia   . Stroke    1993, very small amount of left sided weakness  . Blood dyscrasia     thalasemia, sickle cell trait    Past Surgical History  Procedure Date  . Abdominal hysterectomy age 30 yo    2/2 endometriosis  . Pancreatic stent placement/removal     2011 and 2012  . Tumors     breast tumors removed  in 1980's    Family History  Problem Relation Age of Onset  . Prostate cancer Father   . Sickle cell anemia Brother   . Lung cancer Maternal Aunt   . Lung cancer Maternal Uncle   . Breast cancer Maternal Grandmother      reports that she quit smoking about 10 years ago. She does not have any smokeless tobacco history on file. She reports that she does not drink alcohol or use illicit drugs.  Allergies  Allergen Reactions  . Butalbital-Apap-Caffeine Other (See Comments)    unknown  . Shrimp Flavor Other (See Comments)    unknown  . Sulfonamide Derivatives Other (See Comments)    unknown    Medications Prior to Admission  Medication Dose Route Frequency Provider Last Rate Last Dose  . 0.9 %  sodium chloride infusion   Intravenous Continuous Carleene Cooper III, MD 80 mL/hr at 03/18/11 0504    . acetaminophen (TYLENOL) tablet 650 mg  650 mg Oral Once Quentin Ore, MD   650 mg at 03/18/11 1105  . alum & mag hydroxide-simeth (MAALOX/MYLANTA) 200-200-20 MG/5ML suspension 30 mL  30 mL Oral Q6H PRN Leodis Sias      . Chlorhexidine Gluconate Cloth 2 % PADS 6 each  6 each Topical Q0600 Bernestine Amass. Turner   6 each at 03/18/11 1610  . diphenhydrAMINE (BENADRYL) capsule 25 mg  25 mg Oral Once Quentin Ore, MD   25 mg at 03/18/11 1104  . docusate sodium (COLACE) capsule 100 mg  100 mg Oral BID PRN Leodis Sias      . fentaNYL (SUBLIMAZE) 0.05 MG/ML injection           . heparin 2-0.9 UNIT/ML-% infusion           . HYDROmorphone (DILAUDID) injection 2 mg  2 mg Intravenous Once Carleene Cooper III, MD   2 mg at 03/16/11 1359  . influenza  inactive virus vaccine (FLUZONE/FLUARIX)  injection 0.5 mL  0.5 mL Intramuscular Tomorrow-1000 Bernestine Amass. Turner      . insulin aspart (novoLOG) injection 0-9 Units  0-9 Units Subcutaneous TID WC Christopher Pribula   1 Units at 03/18/11 0756  . iohexol (OMNIPAQUE) 300 MG/ML injection 100 mL  100 mL Intravenous Once PRN Medication Radiologist   100 mL at 03/18/11 0125  . lidocaine (XYLOCAINE) 1 % injection           . lipase/protease/amylase (CREON-10/PANCREASE) capsule 2 capsule  2 capsule Oral TID AC Christopher Pribula   2 capsule at 03/18/11 0756  . lisinopril (PRINIVIL,ZESTRIL) tablet 20 mg  20 mg Oral Daily Christopher Pribula   20 mg at 03/18/11 1103  . metFORMIN (GLUCOPHAGE) tablet 1,000 mg  1,000 mg Oral BID WC Corky Crafts      . midazolam (VERSED) 2 MG/2ML injection           . morphine 4 MG/ML injection 2-4 mg  2-4 mg Intravenous Q4H PRN Lora Poteet Seay, PHARMD   2 mg at 03/18/11 1105  . mupirocin (BACTROBAN) 2 % ointment 1 application  1 application Nasal BID Bernestine Amass. Turner   1 application at 03/18/11 1000  . nitroGLYCERIN (NITROSTAT) SL tablet 0.4 mg  0.4 mg Sublingual Q5 min PRN Carleene Cooper III, MD   0.4 mg at 03/16/11 2009  . nitroGLYCERIN (NTG ON-CALL) 0.2 mg/mL injection           . nitroGLYCERIN 0.2 mg/mL in dextrose 5 % infusion  3 mcg/min Intravenous Titrated Michele Ho   5 mcg/min at 03/17/11 0100  . nitroGLYCERIN 0.2 mg/mL in dextrose 5 % infusion      9 mL/hr at 03/16/11 2115    . ondansetron (ZOFRAN) 4 MG/2ML injection        4 mg at 03/16/11 1128  . ondansetron (ZOFRAN) injection 4 mg  4 mg Intravenous Once Carleene Cooper III, MD   4 mg at 03/16/11 1359  . ondansetron (ZOFRAN) tablet 4 mg  4 mg Oral Q6H PRN Christopher Pribula   4 mg at 03/17/11 1442   Or  . ondansetron (ZOFRAN) injection 4 mg  4 mg Intravenous Q6H PRN Christopher Pribula   4 mg at 03/18/11 0901  . potassium chloride 10 mEq in 100 mL IVPB  10 mEq Intravenous Q1 Hr x 4 Quentin Ore, MD      . potassium chloride SA (K-DUR,KLOR-CON) CR  tablet 40 mEq  40 mEq Oral Q4H Quentin Ore, MD   40 mEq  at 03/18/11 1104  . simvastatin (ZOCOR) tablet 20 mg  20 mg Oral q1800 Christopher Pribula   20 mg at 03/17/11 1743  . traMADol (ULTRAM) tablet 50 mg  50 mg Oral Once Lorretta Harp, MD   50 mg at 03/17/11 2222  . DISCONTD: acetaminophen (TYLENOL) suppository 650 mg  650 mg Rectal Q6H PRN Leodis Sias      . DISCONTD: acetaminophen (TYLENOL) tablet 650 mg  650 mg Oral Q6H PRN Leodis Sias      . DISCONTD: aspirin chewable tablet 324 mg  324 mg Oral Once Carleene Cooper III, MD      . DISCONTD: aspirin EC tablet 81 mg  81 mg Oral Daily Christopher Pribula   81 mg at 03/16/11 2240  . DISCONTD: diazepam (VALIUM) tablet 5 mg  5 mg Oral On Call Corky Crafts      . DISCONTD: enoxaparin (LOVENOX) injection 40 mg  40 mg Subcutaneous QHS Leodis Sias      . DISCONTD: enoxaparin (LOVENOX) injection 40 mg  40 mg Subcutaneous Q24H Carrolyn Meiers      . DISCONTD: enoxaparin (LOVENOX) injection 40 mg  40 mg Subcutaneous Q24H Carrolyn Meiers      . DISCONTD: heparin injection 5,000 Units  5,000 Units Subcutaneous Q8H Corky Crafts      . DISCONTD: morphine 4 MG/ML injection 4 mg  4 mg Intravenous Q4H PRN Hurman Horn, MD   4 mg at 03/16/11 1858  . DISCONTD: nitroGLYCERIN (NITROGLYN) 2 % ointment 0.5 inch  0.5 inch Topical Q6H PRN Carrolyn Meiers      . DISCONTD: sodium chloride 0.9 % injection 3 mL  3 mL Intravenous PRN Corky Crafts       Medications Prior to Admission  Medication Sig Dispense Refill  . amLODipine (NORVASC) 5 MG tablet Take 1 tablet (5 mg total) by mouth daily.  90 tablet  2  . calcium carbonate (TUMS - DOSED IN MG ELEMENTAL CALCIUM) 500 MG chewable tablet Chew 1 tablet by mouth 2 (two) times daily as needed.        Marland Kitchen HYDROcodone-acetaminophen (NORCO) 5-325 MG per tablet Take 1 tablet by mouth every 8 (eight) hours as needed. Please do not fill until 01/27/11  90 tablet  0  . lipase/protease/amylase (CREON) 12000  UNITS CPEP Take 2 capsules by mouth 3 (three) times daily before meals. Take 1 capsule before snacks.  810 capsule  4  . lisinopril (PRINIVIL,ZESTRIL) 20 MG tablet Take 1 tablet (20 mg total) by mouth daily.  90 tablet  1  . loperamide (IMODIUM A-D) 2 MG tablet Take 2 mg by mouth as needed. For diarrhea - not more than 6 tablets a day       . metFORMIN (GLUCOPHAGE) 1000 MG tablet Take 1 tablet (1,000 mg total) by mouth 2 (two) times daily with a meal.  180 tablet  4  . ondansetron (ZOFRAN) 4 MG tablet Take 4 mg by mouth Every 6 hours as needed. Nausea        ROS: Negative except above  PHYICAL EXAM:  Blood pressure 155/77, pulse 72, temperature 97.6 F (36.4 C), temperature source Oral, resp. rate 12, height 5' (1.524 m), weight 110 lb 3.7 oz (50 kg), SpO2 0.00%.  General - pleasant, no distress HEENT - PERRLA, no sinus tenderness, no oral exudate, no LAN Cardiac - s1s2 regular, no murmur, distal pulses palpable Chest - CTA Abd - soft, positive bowel sounds, tender to palpation RLQ, no organomegaly  Ext - right arm swollen, tender Neuro - normal strength Skin - no rashes Psych - normal mood, behavior  Lab Results  Component Value Date   WBC 5.9 03/18/2011   HGB 9.6* 03/18/2011   HCT 28.6* 03/18/2011   MCV 77.5* 03/18/2011   PLT 325 03/18/2011  ,  Lab Results  Component Value Date   CREATININE 0.31* 03/18/2011   BUN 5* 03/18/2011   NA 143 03/18/2011   K 2.1* 03/18/2011   CL 123* 03/18/2011   CO2 15* 03/18/2011  ,  Lab Results  Component Value Date   ALT 14 03/16/2011   AST 19 03/16/2011   ALKPHOS 98 03/16/2011   BILITOT 0.3 03/16/2011  ,  Lab Results  Component Value Date   CKTOTAL 74 03/17/2011   CKMB 2.3 03/17/2011   TROPONINI <0.30 03/17/2011   Lab Results  Component Value Date   DDIMER 0.42 03/16/2011  ,  Lab Results  Component Value Date   ESRSEDRATE 16 12/11/2007  ,  Lab Results  Component Value Date   TSH 0.522 Test methodology is 3rd generation  TSH 12/11/2007    ABG    Component Value Date/Time   HCO3 29.9* 04/07/2007 1719   TCO2 25 11/22/2010 1338      Dg Elbow 2 Views Right  03/18/2011  *RADIOLOGY REPORT*  Clinical Data: 55 year old female status post IV extravasation into the right antecubital fossa, question iodinated contrast versus saline chaser.  RIGHT ELBOW - 2 VIEW  Comparison: None.  Findings: AP view of the right elbow demonstrate a significant volume of contrast extravasation, estimated to be at least 25 mL.  IMPRESSION: Iodinated contrast extravasation confirmed into the right antecubital fossa, at least 25 ml suspected.  Original Report Authenticated By: Harley Hallmark, M.D.   Ct Angio Chest W/cm &/or Wo Cm  03/18/2011  *RADIOLOGY REPORT*  Clinical Data:  Chest pain and shortness of breath for 1 day.  CT ANGIOGRAPHY CHEST WITH CONTRAST  Technique:  Multidetector CT imaging of the chest was performed using the standard protocol during bolus administration of intravenous contrast.  Multiplanar CT image reconstructions including MIPs were obtained to evaluate the vascular anatomy.  At least 25 mL of contrast extravasated into the patient's right antecubital fossa; this was discussed with the patient's clinical team, and evaluated by the radiologist.  Contrast: OMNIPAQUE IOHEXOL 300 MG/ML IV SOLN  Comparison:  Chest radiograph performed 03/16/2011, and CTA of the chest performed 04/14/2009  Findings:  There is no evidence of central pulmonary embolus. Evaluation for pulmonary embolus is significantly suboptimal due to motion artifact.  There is a single small 3 mm nodule noted in the periphery of the right upper lobe (image 43 of 85).  The additional smaller pulmonary nodule previously characterized in the right upper lobe is not well seen due to motion artifact.  The lungs are otherwise clear.  There is no evidence of significant focal consolidation, pleural effusion or pneumothorax.  No masses are identified; no abnormal  focal contrast enhancement is seen.  The mediastinum is unremarkable in appearance.  There is no evidence of mediastinal lymphadenopathy.  No pericardial effusion seen.  The great vessels are within normal limits.  No axillary lymphadenopathy is seen.  The visualized portions of the thyroid gland are unremarkable in appearance.  The visualized portions of the liver and spleen are unremarkable. The visualized portions of the pancreas, gallbladder, stomach, adrenal glands and kidneys are within normal limits.  No acute osseous abnormalities are seen.  Small lucencies within the thoracic spine are stable from prior studies.  Review of the MIP images confirms the above findings.  IMPRESSION:  1.  No evidence of central pulmonary embolus; evaluation for pulmonary embolus is significantly suboptimal due to motion artifact. 2.  Stable benign small right upper lobe pulmonary nodule noted; lungs otherwise clear.  Original Report Authenticated By: Tonia Ghent, M.D.   . ASSESSMENT/PLAN:  Anemia after cardiac cath with RLQ abd pain -?retro-peritoneal bleed>>agree with CT abd/pelvis -?if Hb 6.5 was lab error>>hold off on transfusion for now -f/u CBC -transfuse for active bleeding with hemodynamic compromise or Hb < 7 -hold ASA, SQ heparin  Hx of sickle cell trait -unlikely that this is contributing to current picture  Hypokalemia, non-GAP metabolic acidosis -? If this was also lab error -repeat BMET now and then decide if ABG and KCL repletion needed  Chest pain with negative cardiac cath and negative CT chest -?if she has GI source of chest pain>>defer assessment to IM teaching service  Remote hx of CVA -will likely need to resume anti-platlet therapy at some point  DM -hold metformin for now since she had recent IV contrast studies -SSI per teaching service  Contrast extravasation into Rt arm -per radiology protocol  Discussed plan with resident.  PCCM can be available as  needed.  Eldar Robitaille Pager:  [phone] 03/18/2011, 11:42 AM

## 2011-03-18 NOTE — Progress Notes (Signed)
Patient awakened from sleep; crying; writhing in bed; c/o acute left sided abdominal pain. Internal medicine paged @ 2055 and 2110. No return call received.  CCM physician notified. Ruffin Pyo

## 2011-03-18 NOTE — Progress Notes (Addendum)
UPDATE 14:15  S: Pt doing well.  Sudden drops in labs have normalized w/o any intervention, raising the ? Of erreneous labs. Pt feeling well, denies dysuria, urinary frequency, pain.  O: see labs in epic  A/P: - CT scan shows no acute large bleed - LDH wnl, making hemolysis less likely - discussed h/o thalassemia and sickle trait with on call heme/onc (Dr. Arline Asp), who felt that hx would not contribute to acute anemia - iron panel shows Fe def: EGD in 2011 showed nl esophagus and stomach. Nl colonoscopy in 2004. - awaiting FOBT - f/u CBC this p.m. - if stable, transfer down to step down. - urine cx returned + for MRSA.  Pt completely asymptommatic, no s/s of bacteremia/sepsis.  D/w Dr. Maurice March of ID.  Advised for surviellance blood cx, no abx at this time.

## 2011-03-18 NOTE — Progress Notes (Cosign Needed)
This patient has received 25 ml's of IV Omnipaque 300 contrast extravasation into Right AC during a CT Angio Chest exam.  The exam was performed on 03/18/2011  Site / affected area assessed by Dr. Augusto Gamble  Spoke to Bee, RN and informed her of what happened and that the IV had been removed.  Will be looking on chart for information from radiologist

## 2011-03-18 NOTE — Progress Notes (Signed)
Return phone call received from Internal Medicine.  Resident updated on patient's status to include acute abdominal pain and pending transfer.  Orders received. Ruffin Pyo

## 2011-03-18 NOTE — Progress Notes (Deleted)
CRITICAL VALUE ALERT  Critical value received:  K = 6.5  Date of notification:  03/18/11  Time of notification:  2123  Critical value read back:yes  Nurse who received alert:  Brayln Duque Loretta   MD notified (1st page):  Dr Hulett  Time of first page:  2130  MD notified (2nd page):  Time of second page:  Responding MD:  Dr Hulett  Time MD responded:  2130 

## 2011-03-18 NOTE — Progress Notes (Deleted)
CRITICAL VALUE ALERT  Critical value received:  CKMB = 19.3  Date of notification:  03/18/11  Time of notification:  2117  Critical value read back:yes  Nurse who received alert:  Ruffin Pyo   MD notified (1st page):  Dr. Michele Rockers @ 2120  Time of second page:  Responding MD:  Dr Michele Rockers  Time MD responded:  2120

## 2011-03-19 LAB — IRON AND TIBC
Saturation Ratios: 13 % — ABNORMAL LOW (ref 20–55)
TIBC: 286 ug/dL (ref 250–470)

## 2011-03-19 LAB — FERRITIN: Ferritin: 52 ng/mL (ref 10–291)

## 2011-03-19 LAB — BASIC METABOLIC PANEL
BUN: 5 mg/dL — ABNORMAL LOW (ref 6–23)
CO2: 25 mEq/L (ref 19–32)
Calcium: 9.7 mg/dL (ref 8.4–10.5)
Creatinine, Ser: 0.59 mg/dL (ref 0.50–1.10)
Glucose, Bld: 84 mg/dL (ref 70–99)

## 2011-03-19 LAB — GLUCOSE, CAPILLARY
Glucose-Capillary: 75 mg/dL (ref 70–99)
Glucose-Capillary: 88 mg/dL (ref 70–99)

## 2011-03-19 MED ORDER — HYDROCODONE-ACETAMINOPHEN 5-325 MG PO TABS
1.0000 | ORAL_TABLET | ORAL | Status: DC | PRN
  Administered 2011-03-19 – 2011-03-21 (×11): 2 via ORAL
  Filled 2011-03-19 (×12): qty 2

## 2011-03-19 NOTE — Progress Notes (Signed)
Patient transported via bed to 2003.

## 2011-03-19 NOTE — Progress Notes (Signed)
Report given to Reggie RN on unit 2000.

## 2011-03-19 NOTE — Progress Notes (Signed)
Subjective: ABdominal pain overnight.  At dinner last night and has had problems since.  1 episode of loose stool after dinner.  States she did take her Creon.  Also has not eaten today but pain still present. Denies chest pain, SOB.  Objective: Vital signs in last 24 hours: Filed Vitals:   03/18/11 2300 03/19/11 0000 03/19/11 0100 03/19/11 0507  BP: 118/73 112/75 128/89 152/80  Pulse: 64  66 61  Temp:  97.4 F (36.3 C) 98.3 F (36.8 C) 97.8 F (36.6 C)  TempSrc:  Oral Oral Oral  Resp:  16 18 17   Height:      Weight:   111 lb 8 oz (50.576 kg)   SpO2: 95% 96% 90% 97%   Weight change: 3 lb 4.9 oz (1.5 kg)  Intake/Output Summary (Last 24 hours) at 03/19/11 1205 Last data filed at 03/18/11 2300  Gross per 24 hour  Intake   1496 ml  Output    800 ml  Net    696 ml   Physical Exam: Vitals reviewed. General: resting in bed, anxious appearing, mild distress from abdominal pain HEENT: PERRL, EOMI, no scleral icterus Cardiac: RRR, no rubs, murmurs or gallops Pulm: clear to auscultation bilaterally, no wheezes, rales, or rhonchi Abd: soft, diffuse tenderness, epigastric and RUQ foci, nondistended, BS present Ext: warm and well perfused, no pedal edema Neuro: alert and oriented X3, cranial nerves II-XII grossly intact, strength and sensation to light touch equal in bilateral upper and lower extremities  Lab Results: Basic Metabolic Panel:  Lab 03/18/11 1610 03/18/11 0914  NA 139 143  K 4.0 2.1*  CL 104 123*  CO2 28 15*  GLUCOSE 114* 124*  BUN 8 5*  CREATININE 0.59 0.31*  CALCIUM 9.6 4.6*  MG -- --  PHOS -- --   Liver Function Tests:  Lab 03/16/11 1108  AST 19  ALT 14  ALKPHOS 98  BILITOT 0.3  PROT 9.4*  ALBUMIN 4.6    Lab 03/18/11 0610 03/16/11 1108  LIPASE 37 79*  AMYLASE -- --   CBC:  Lab 03/18/11 2052 03/18/11 1517 03/16/11 1108  WBC 7.2 7.5 --  NEUTROABS -- -- 4.6  HGB 10.4* 11.0* --  HCT 31.4* 32.4* --  MCV 77.1* 77.1* --  PLT 375 409* --    Cardiac Enzymes:  Lab 03/17/11 1642 03/17/11 1134 03/17/11 0041  CKTOTAL 74 76 82  CKMB 2.3 2.1 1.9  CKMBINDEX -- -- --  TROPONINI <0.30 <0.30 <0.30   BNP:  Lab 03/16/11 1208  POCBNP 89.5   D-Dimer:  Lab 03/16/11 1108  DDIMER 0.42   CBG:  Lab 03/18/11 1658 03/18/11 1237 03/18/11 0741 03/17/11 2102 03/17/11 1652 03/17/11 1148  GLUCAP 122* 100* 143* 127* 157* 116*   Hemoglobin A1C:  Lab 03/17/11 0515  HGBA1C 6.7*   Fasting Lipid Panel:  Lab 03/17/11 0515  CHOL 214*  HDL 73  LDLCALC 124*  TRIG 86  CHOLHDL 2.9  LDLDIRECT --   Coagulation:  Lab 03/16/11 1108  LABPROT 12.7  INR 0.93   Anemia Panel:  Lab 03/18/11 0610  VITAMINB12 250  FOLATE 10.7  FERRITIN 25  TIBC 121*  IRON 14*  RETICCTPCT 1.3   Misc Results: Lactic Acid: 0.8 (N) Haptoglobin: 81 (N) Path review smear: Teardrop cells LD: 181 (N) Fibrinogen: 228 (N)  Micro Results: Recent Results (from the past 240 hour(s))  URINE CULTURE     Status: Normal   Collection Time   03/16/11 11:58 AM  Component Value Range Status Comment   Specimen Description URINE, CLEAN CATCH   Final    Special Requests NONE   Final    Setup Time [id number]   Final    Colony Count 55,000 COLONIES/ML   Final    Culture     Final    Value: METHICILLIN RESISTANT STAPHYLOCOCCUS AUREUS     Note: RIFAMPIN AND GENTAMICIN SHOULD NOT BE USED AS SINGLE DRUGS FOR TREATMENT OF STAPH INFECTIONS.   Report Status 03/18/2011 FINAL   Final    Organism ID, Bacteria METHICILLIN RESISTANT STAPHYLOCOCCUS AUREUS   Final   MRSA PCR SCREENING     Status: Abnormal   Collection Time   03/16/11  9:48 PM      Component Value Range Status Comment   MRSA by PCR POSITIVE (*) NEGATIVE  Final   TECHNOLOGIST SMEAR REVIEW     Status: Normal   Collection Time   03/18/11 11:00 AM      Component Value Range Status Comment   Path Review TEARDROP CELLS   Final   CULTURE, BLOOD (ROUTINE X 2)     Status: Normal (Preliminary result)    Collection Time   03/18/11  3:06 PM      Component Value Range Status Comment   Specimen Description BLOOD LEFT ARM   Final    Special Requests BOTTLES DRAWN AEROBIC ONLY 8.5CC   Final    Setup Time [id number]   Final    Culture     Final    Value:        BLOOD CULTURE RECEIVED NO GROWTH TO DATE CULTURE WILL BE HELD FOR 5 DAYS BEFORE ISSUING A FINAL NEGATIVE REPORT   Report Status PENDING   Incomplete   CULTURE, BLOOD (ROUTINE X 2)     Status: Normal (Preliminary result)   Collection Time   03/18/11  3:20 PM      Component Value Range Status Comment   Specimen Description BLOOD RIGHT HAND   Final    Special Requests BOTTLES DRAWN AEROBIC ONLY 9CC   Final    Setup Time [id number]   Final    Culture     Final    Value:        BLOOD CULTURE RECEIVED NO GROWTH TO DATE CULTURE WILL BE HELD FOR 5 DAYS BEFORE ISSUING A FINAL NEGATIVE REPORT   Report Status PENDING   Incomplete    Studies/Results: Ct Abdomen Pelvis Wo Contrast  03/18/2011  MPRESSION:  1.  Small right groin and extraperitoneal pelvic hematoma on the right side. 2.  No retroperitoneal hematoma. 3.  No other significant abdominal/pelvic findings.  Original Report Authenticated By: P. Loralie Champagne, M.D.   Dg Elbow 2 Views Right  03/18/2011  *RADIOLOGY REPORT*  Clinical Data: 55 year old female status post IV extravasation into the right antecubital fossa, question iodinated contrast versus saline chaser.  RIGHT ELBOW - 2 VIEW  Comparison: None.  Findings: AP view of the right elbow demonstrate a significant volume of contrast extravasation, estimated to be at least 25 mL.  IMPRESSION: Iodinated contrast extravasation confirmed into the right antecubital fossa, at least 25 ml suspected.  Original Report Authenticated By: Harley Hallmark, M.D.   Ct Angio Chest W/cm &/or Wo Cm  03/18/2011  IMPRESSION:  1.  No evidence of central pulmonary embolus; evaluation for pulmonary embolus is significantly suboptimal due to motion  artifact. 2.  Stable benign small right upper lobe pulmonary nodule noted; lungs otherwise clear.  Original Report  Authenticated By: Tonia Ghent, M.D.   Cath report: Completely normal per Cards  Medications: I have reviewed the patient's current medications. Scheduled Meds:   . Chlorhexidine Gluconate Cloth  6 each Topical Q0600  .  HYDROmorphone (DILAUDID) injection  0.5 mg Intravenous Once  . insulin aspart  0-9 Units Subcutaneous TID WC  . lipase/protease/amylase  2 capsule Oral TID AC  . lisinopril  20 mg Oral Daily  .  morphine injection  6 mg Intravenous Once  . mupirocin ointment  1 application Nasal BID  . simvastatin  20 mg Oral q1800  . DISCONTD: Chlorhexidine Gluconate Cloth  6 each Topical Q0600   Continuous Infusions:   . sodium chloride 80 mL/hr (03/18/11 1741)  . nitroGLYCERIN Stopped (03/17/11 0934)   PRN Meds:.alum & mag hydroxide-simeth, docusate sodium, HYDROcodone-acetaminophen, morphine, nitroGLYCERIN, ondansetron (ZOFRAN) IV, ondansetron  Assessment/Plan: 1. Chest Pain: S/p cardiac cath, showed no CAD. No further CP today. Lipase normal today. CTA was negative though limited by motion artifact. Will continue to follow up but suspect pain is secondary to abdominal pain currently.    2. Abdominal Pain: Pt c/o mild RUQ abdominal pain and TTP on exam, states it has been worse since dinner last night.  She hasn't eaten today.  Did have one episode of loose stool yesterday.  Plan for pain control with morphine and vicodin today and changing diet to sips and chips with advance to clear liquids as tolerated.  Will reevaluate in the AM.  3. Anemia: Hb drop yesterday was likely lab error.  Today is stable at 11.  Anemia panel pending.    4.Ppx: SCDs   LOS: 3 days   Trina Asch 03/19/2011, 12:05 PM

## 2011-03-19 NOTE — Progress Notes (Signed)
SUBJECTIVE:  Abdominal pain.  No chest pain now.  OBJECTIVE:   Vitals:   Filed Vitals:   03/18/11 2300 03/19/11 0000 03/19/11 0100 03/19/11 0507  BP: 118/73 112/75 128/89 152/80  Pulse: 64  66 61  Temp:  97.4 F (36.3 C) 98.3 F (36.8 C) 97.8 F (36.6 C)  TempSrc:  Oral Oral Oral  Resp:  16 18 17   Height:      Weight:   50.576 kg (111 lb 8 oz)   SpO2: 95% 96% 90% 97%   I&O's:   Intake/Output Summary (Last 24 hours) at 03/19/11 0820 Last data filed at 03/18/11 2300  Gross per 24 hour  Intake   1496 ml  Output    800 ml  Net    696 ml   TELEMETRY: Reviewed telemetry pt in NSR:     PHYSICAL EXAM General: Well developed, well nourished, in no acute distress Head: Eyes PERRLA, No xanthomas.   Normal cephalic and atramatic  Lungs:    Clear bilaterally to auscultation and percussion. Heart:    HRRR S1 S2 Pulses are 2+ & equal.            No carotid bruit. No JVD.  No abdominal bruits. No femoral bruits. Abdomen: Bowel sounds are positive, abdomen soft and non-tender without masses or                  Hernia's noted. Msk:  Back normal, normal gait. Normal strength and tone for age. Extremities:  No right groin hematoma.  No  edema.   Psych:  Good affect, responds appropriately   LABS: Basic Metabolic Panel:  Basename 03/18/11 1200 03/18/11 0914  NA 139 143  K 4.0 2.1*  CL 104 123*  CO2 28 15*  GLUCOSE 114* 124*  BUN 8 5*  CREATININE 0.59 0.31*  CALCIUM 9.6 4.6*  MG -- --  PHOS -- --   Liver Function Tests:  Basename 03/16/11 1108  AST 19  ALT 14  ALKPHOS 98  BILITOT 0.3  PROT 9.4*  ALBUMIN 4.6    Basename 03/18/11 0610 03/16/11 1108  LIPASE 37 79*  AMYLASE -- --   CBC:  Basename 03/18/11 2052 03/18/11 1517 03/16/11 1108  WBC 7.2 7.5 --  NEUTROABS -- -- 4.6  HGB 10.4* 11.0* --  HCT 31.4* 32.4* --  MCV 77.1* 77.1* --  PLT 375 409* --   Cardiac Enzymes:  Basename 03/17/11 1642 03/17/11 1134 03/17/11 0041  CKTOTAL 74 76 82  CKMB 2.3 2.1  1.9  CKMBINDEX -- -- --  TROPONINI <0.30 <0.30 <0.30   BNP:  Basename 03/16/11 1208  POCBNP 89.5   D-Dimer:  Basename 03/16/11 1108  DDIMER 0.42   Hemoglobin A1C:  Basename 03/17/11 0515  HGBA1C 6.7*   Fasting Lipid Panel:  Basename 03/17/11 0515  CHOL 214*  HDL 73  LDLCALC 124*  TRIG 86  CHOLHDL 2.9  LDLDIRECT --   Thyroid Function Tests: No results found for this basename: TSH,T4TOTAL,FREET3,T3FREE,THYROIDAB in the last 72 hours Anemia Panel:  Basename 03/18/11 0610  VITAMINB12 250  FOLATE 10.7  FERRITIN 25  TIBC 121*  IRON 14*  RETICCTPCT 1.3   Coag Panel:   Lab Results  Component Value Date   INR 0.93 03/16/2011   INR 1.0 12/11/2007    RADIOLOGY: Ct Abdomen Pelvis Wo Contrast  03/18/2011  *RADIOLOGY REPORT*  Clinical Data: Drop in hemoglobin following cardiac catheterization.  CT ABDOMEN AND PELVIS WITHOUT CONTRAST  Technique:  Multidetector CT  imaging of the abdomen and pelvis was performed following the standard protocol without intravenous contrast.  Comparison: CT scan 04/08/2010.  Findings: The lung bases are clear.  No pleural effusion.  The unenhanced appearance of the liver is unremarkable.  No biliary dilatation.  The gallbladder appears normal.  Slight increased attenuation may be due to vicarious excretion from recent contrast enhanced chest CT and/or cardiac catheterization.  The spleen is normal in size.  No focal lesions.  The pancreas is unremarkable except for one small dot of air in the main pancreatic duct.  This is most likely related to a prior sphincterotomy.  No findings for acute pancreatitis.  The adrenal glands and kidneys are unremarkable.  No retroperitoneal hematoma.  The stomach, duodenum, small bowel and colon are unremarkable.  No mesenteric or retroperitoneal mass or adenopathy.  The aorta is normal in caliber.  The bladder is distended with contrast.  No abnormalities are identified.  The uterus is surgically absent.  The ovaries  are not identified for certain.  There is a small right groin and extra pelvic peritoneal hematoma.  The bony structures are unremarkable. Extensive injection granulomas are noted in the buttock regions bilaterally.  IMPRESSION:  1.  Small right groin and extraperitoneal pelvic hematoma on the right side. 2.  No retroperitoneal hematoma. 3.  No other significant abdominal/pelvic findings.  Original Report Authenticated By: P. Loralie Champagne, M.D.   Dg Elbow 2 Views Right  03/18/2011  *RADIOLOGY REPORT*  Clinical Data: 55 year old female status post IV extravasation into the right antecubital fossa, question iodinated contrast versus saline chaser.  RIGHT ELBOW - 2 VIEW  Comparison: None.  Findings: AP view of the right elbow demonstrate a significant volume of contrast extravasation, estimated to be at least 25 mL.  IMPRESSION: Iodinated contrast extravasation confirmed into the right antecubital fossa, at least 25 ml suspected.  Original Report Authenticated By: Harley Hallmark, M.D.   Ct Angio Chest W/cm &/or Wo Cm  03/18/2011  *RADIOLOGY REPORT*  Clinical Data:  Chest pain and shortness of breath for 1 day.  CT ANGIOGRAPHY CHEST WITH CONTRAST  Technique:  Multidetector CT imaging of the chest was performed using the standard protocol during bolus administration of intravenous contrast.  Multiplanar CT image reconstructions including MIPs were obtained to evaluate the vascular anatomy.  At least 25 mL of contrast extravasated into the patient's right antecubital fossa; this was discussed with the patient's clinical team, and evaluated by the radiologist.  Contrast: OMNIPAQUE IOHEXOL 300 MG/ML IV SOLN  Comparison:  Chest radiograph performed 03/16/2011, and CTA of the chest performed 04/14/2009  Findings:  There is no evidence of central pulmonary embolus. Evaluation for pulmonary embolus is significantly suboptimal due to motion artifact.  There is a single small 3 mm nodule noted in the periphery of  the right upper lobe (image 43 of 85).  The additional smaller pulmonary nodule previously characterized in the right upper lobe is not well seen due to motion artifact.  The lungs are otherwise clear.  There is no evidence of significant focal consolidation, pleural effusion or pneumothorax.  No masses are identified; no abnormal focal contrast enhancement is seen.  The mediastinum is unremarkable in appearance.  There is no evidence of mediastinal lymphadenopathy.  No pericardial effusion seen.  The great vessels are within normal limits.  No axillary lymphadenopathy is seen.  The visualized portions of the thyroid gland are unremarkable in appearance.  The visualized portions of the liver and spleen are  unremarkable. The visualized portions of the pancreas, gallbladder, stomach, adrenal glands and kidneys are within normal limits.  No acute osseous abnormalities are seen.  Small lucencies within the thoracic spine are stable from prior studies.  Review of the MIP images confirms the above findings.  IMPRESSION:  1.  No evidence of central pulmonary embolus; evaluation for pulmonary embolus is significantly suboptimal due to motion artifact. 2.  Stable benign small right upper lobe pulmonary nodule noted; lungs otherwise clear.  Original Report Authenticated By: Tonia Ghent, M.D.   Dg Chest Port 1 View  03/16/2011  *RADIOLOGY REPORT*  Clinical Data: Left chest pain  PORTABLE CHEST - 1 VIEW  Comparison: 09/16/2009  Findings: Cardiomediastinal silhouette is stable.  No acute infiltrate or pleural effusion.  No pulmonary edema.  Bony thorax is stable.  Linear atelectasis or scarring in the right upper lobe.  IMPRESSION: No active disease.  No significant change.  Original Report Authenticated By: Natasha Mead, M.D.      ASSESSMENT: abnormal labs post cath, increased LDL PLAN:  1) Of note, prior to cath, Hbg went from 13 to 10.8.  Now hemoglobin is in the 10-11 range.  CT showed no RP bleed.  I do not see  that the patient was transfused.  May have been erroneous lab result.  No evidence of significant bleeding on exam.  2) LDL 124.  WIll need LDL to be lowered to < 100 given diabetes.  3) Abdominal pain, may be related to pancreatitis.  Will sign off. Call with questions.  Lynnel Zanetti S.  03/19/2011  8:20 AM

## 2011-03-20 LAB — GLUCOSE, CAPILLARY
Glucose-Capillary: 86 mg/dL (ref 70–99)
Glucose-Capillary: 151 mg/dL — ABNORMAL HIGH (ref 70–99)

## 2011-03-20 LAB — BASIC METABOLIC PANEL
BUN: 5 mg/dL — ABNORMAL LOW (ref 6–23)
CO2: 27 mEq/L (ref 19–32)
Calcium: 9.5 mg/dL (ref 8.4–10.5)
Creatinine, Ser: 0.62 mg/dL (ref 0.50–1.10)
GFR calc Af Amer: >90 mL/min (ref >90)
GFR calc non Af Amer: >90 mL/min (ref >90)
Glucose, Bld: 100 mg/dL — ABNORMAL HIGH (ref 70–99)
Potassium: 4.2 mEq/L (ref 3.5–5.1)

## 2011-03-20 LAB — CBC
Hemoglobin: 10.8 g/dL — ABNORMAL LOW (ref 12.0–15.0)
MCH: 25.8 pg — ABNORMAL LOW (ref 26.0–34.0)
MCHC: 33 g/dL (ref 30.0–36.0)
MCV: 78 fL (ref 78.0–100.0)
Platelets: 385 10*3/uL (ref 150–400)
RDW: 14.7 % (ref 11.5–15.5)

## 2011-03-20 NOTE — Progress Notes (Signed)
Subjective: Pt feels well, still mild abd tenderness.  Tolerating clear diet well.  No events overnight  Objective: Vital signs in last 24 hours: Filed Vitals:   03/19/11 0507 03/19/11 1509 03/19/11 2045 03/20/11 0507  BP: 152/80 131/72 148/88 141/70  Pulse: 61 68 56 60  Temp: 97.8 F (36.6 C) 98.5 F (36.9 C) 97.9 F (36.6 C) 97.3 F (36.3 C)  TempSrc: Oral Oral Oral Oral  Resp: 17 16 19 18   Height:      Weight:      SpO2: 97% 100% 94% 97%   Weight change:   Intake/Output Summary (Last 24 hours) at 03/20/11 1342 Last data filed at 03/20/11 0508  Gross per 24 hour  Intake      0 ml  Output   1300 ml  Net  -1300 ml   Physical Exam: General: resting in bed, NAD HEENT: PERRL, EOMI, no scleral icterus  Cardiac: RRR, no rubs, murmurs or gallops  Pulm: clear to auscultation bilaterally, no wheezes, rales, or rhonchi  Abd: soft, mild tenderness in epigastric and RUQ foci, nondistended, BS present  Ext: warm and well perfused, no pedal edema  Lab Results: Basic Metabolic Panel:  Lab 03/20/11 4098 03/19/11 1345  NA 141 138  K 4.2 3.8  CL 104 101  CO2 27 25  GLUCOSE 100* 84  BUN 5* 5*  CREATININE 0.62 0.59  CALCIUM 9.5 9.7  MG -- --  PHOS -- --   Liver Function Tests:  Lab 03/16/11 1108  AST 19  ALT 14  ALKPHOS 98  BILITOT 0.3  PROT 9.4*  ALBUMIN 4.6    Lab 03/20/11 0715 03/18/11 0610  LIPASE 27 37  AMYLASE -- --   CBC:  Lab 03/20/11 0715 03/18/11 2052 03/16/11 1108  WBC 4.8 7.2 --  NEUTROABS -- -- 4.6  HGB 10.8* 10.4* --  HCT 32.7* 31.4* --  MCV 78.0 77.1* --  PLT 385 375 --   Cardiac Enzymes:  Lab 03/17/11 1642 03/17/11 1134 03/17/11 0041  CKTOTAL 74 76 82  CKMB 2.3 2.1 1.9  CKMBINDEX -- -- --  TROPONINI <0.30 <0.30 <0.30   BNP:  Lab 03/16/11 1208  POCBNP 89.5   D-Dimer:  Lab 03/16/11 1108  DDIMER 0.42   CBG:  Lab 03/20/11 1146 03/20/11 0601 03/19/11 2140 03/19/11 1736 03/19/11 1618 03/19/11 1222  GLUCAP 86 97 88 161* 75 92    Hemoglobin A1C:  Lab 03/17/11 0515  HGBA1C 6.7*   Fasting Lipid Panel:  Lab 03/17/11 0515  CHOL 214*  HDL 73  LDLCALC 124*  TRIG 86  CHOLHDL 2.9  LDLDIRECT --   Coagulation:  Lab 03/16/11 1108  LABPROT 12.7  INR 0.93   Anemia Panel:  Lab 03/19/11 1511 03/18/11 0610  VITAMINB12 -- 250  FOLATE -- 10.7  FERRITIN 52 --  TIBC 286 --  IRON 36* --  RETICCTPCT -- 1.3    Micro Results: Recent Results (from the past 240 hour(s))  URINE CULTURE     Status: Normal   Collection Time   03/16/11 11:58 AM      Component Value Range Status Comment   Specimen Description URINE, CLEAN CATCH   Final    Special Requests NONE   Final    Setup Time [id number]   Final    Colony Count 55,000 COLONIES/ML   Final    Culture     Final    Value: METHICILLIN RESISTANT STAPHYLOCOCCUS AUREUS     Note: RIFAMPIN AND GENTAMICIN  SHOULD NOT BE USED AS SINGLE DRUGS FOR TREATMENT OF STAPH INFECTIONS.   Report Status 03/18/2011 FINAL   Final    Organism ID, Bacteria METHICILLIN RESISTANT STAPHYLOCOCCUS AUREUS   Final   MRSA PCR SCREENING     Status: Abnormal   Collection Time   03/16/11  9:48 PM      Component Value Range Status Comment   MRSA by PCR POSITIVE (*) NEGATIVE  Final   TECHNOLOGIST SMEAR REVIEW     Status: Normal   Collection Time   03/18/11 11:00 AM      Component Value Range Status Comment   Path Review TEARDROP CELLS   Final   CULTURE, BLOOD (ROUTINE X 2)     Status: Normal (Preliminary result)   Collection Time   03/18/11  3:06 PM      Component Value Range Status Comment   Specimen Description BLOOD LEFT ARM   Final    Special Requests BOTTLES DRAWN AEROBIC ONLY 8.5CC   Final    Setup Time [id number]   Final    Culture     Final    Value:        BLOOD CULTURE RECEIVED NO GROWTH TO DATE CULTURE WILL BE HELD FOR 5 DAYS BEFORE ISSUING A FINAL NEGATIVE REPORT   Report Status PENDING   Incomplete   CULTURE, BLOOD (ROUTINE X 2)     Status: Normal (Preliminary result)    Collection Time   03/18/11  3:20 PM      Component Value Range Status Comment   Specimen Description BLOOD RIGHT HAND   Final    Special Requests BOTTLES DRAWN AEROBIC ONLY 9CC   Final    Setup Time [id number]   Final    Culture     Final    Value:        BLOOD CULTURE RECEIVED NO GROWTH TO DATE CULTURE WILL BE HELD FOR 5 DAYS BEFORE ISSUING A FINAL NEGATIVE REPORT   Report Status PENDING   Incomplete    Studies/Results: No results found. Medications: I have reviewed the patient's current medications. Scheduled Meds:   . Chlorhexidine Gluconate Cloth  6 each Topical Q0600  . insulin aspart  0-9 Units Subcutaneous TID WC  . lipase/protease/amylase  2 capsule Oral TID AC  . lisinopril  20 mg Oral Daily  . mupirocin ointment  1 application Nasal BID  . simvastatin  20 mg Oral q1800   Continuous Infusions:   . sodium chloride 80 mL/hr at 03/19/11 2322  . nitroGLYCERIN Stopped (03/17/11 0934)   PRN Meds:.alum & mag hydroxide-simeth, docusate sodium, HYDROcodone-acetaminophen, morphine, nitroGLYCERIN, ondansetron (ZOFRAN) IV, ondansetron  Assessment/Plan: # Chest Pain: S/p cardiac cath, showed no CAD. No further CP today. Lipase normal today. CTA was negative though limited by motion artifact. Will continue to follow up but suspect pain is secondary to abdominal pain currently.   # Abdominal Pain: Pt c/o mild RUQ abdominal pain and TTP on exam, improved from yesterday, tolerating clears well.  Lipase normal.  Plan to advance diet and possibly d/c in a.m. If tolerating food well. - advance to soft mechanical  # Anemia: Hb stable, labs indicate Fe deficiency.  Has f/u with gastroenterologist for EGD.  Will likely need colo as well (last one 2004) to w/u cause of Fe def.  Given extensive pancreatic probs, could be malabsorption? 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  No intervention  at this time.  #Ppx: SCDs   LOS: 4 days    WILDMAN-TOBRINER, BEN 03/20/2011, 1:42 PM

## 2011-03-21 DIAGNOSIS — R079 Chest pain, unspecified: Secondary | ICD-10-CM

## 2011-03-21 LAB — GLUCOSE, CAPILLARY

## 2011-03-21 MED ORDER — PRAVASTATIN SODIUM 40 MG PO TABS: 40.0000 mg | ORAL_TABLET | Freq: Every day | ORAL | Status: DC

## 2011-03-21 MED ORDER — SERTRALINE HCL 50 MG PO TABS: 50.0000 mg | ORAL_TABLET | Freq: Every day | ORAL | Status: DC

## 2011-03-21 NOTE — Progress Notes (Signed)
Pt discharge home via wheel chair. Pt verbalize understanding of medications and discharge instructions.

## 2011-03-21 NOTE — Progress Notes (Signed)
Subjective: Feeling great today.  Ate a small breakfast which was her normal amount and states that she feels good.  Wants to go home today.  Denies SOB, chest pain, abdominal pain, nausea, vomiting, fevers, or chills.   Objective: Vital signs in last 24 hours: Filed Vitals:   03/20/11 0507 03/20/11 1528 03/20/11 2121 03/21/11 0548  BP: 141/70 155/82 138/76 151/82  Pulse: 60 60 64 62  Temp: 97.3 F (36.3 C) 97.9 F (36.6 C) 98.4 F (36.9 C) 98 F (36.7 C)  TempSrc: Oral Oral Oral Oral  Resp: 18 18 19 20   Height:      Weight:      SpO2: 97% 94% 97% 98%   Weight change:   Intake/Output Summary (Last 24 hours) at 03/21/11 1018 Last data filed at 03/21/11 0827  Gross per 24 hour  Intake    360 ml  Output   2550 ml  Net  -2190 ml   Physical Exam: Vitals reviewed. General: resting in bed, NAD HEENT: PERRL, EOMI, no scleral icterus Cardiac: RRR, no rubs, murmurs or gallops Pulm: clear to auscultation bilaterally, no wheezes, rales, or rhonchi Abd: soft, mild tenderness in the epigastric area, nondistended, BS present Ext: warm and well perfused, no pedal edema Neuro: alert and oriented X3, cranial nerves II-XII grossly intact, strength and sensation to light touch equal in bilateral upper and lower extremities  Lab Results: Basic Metabolic Panel:  Lab 03/20/11 4098 03/19/11 1345  NA 141 138  K 4.2 3.8  CL 104 101  CO2 27 25  GLUCOSE 100* 84  BUN 5* 5*  CREATININE 0.62 0.59  CALCIUM 9.5 9.7  MG -- --  PHOS -- --   Liver Function Tests:  Lab 03/16/11 1108  AST 19  ALT 14  ALKPHOS 98  BILITOT 0.3  PROT 9.4*  ALBUMIN 4.6    Lab 03/20/11 0715 03/18/11 0610  LIPASE 27 37  AMYLASE -- --   CBC:  Lab 03/20/11 0715 03/18/11 2052 03/16/11 1108  WBC 4.8 7.2 --  NEUTROABS -- -- 4.6  HGB 10.8* 10.4* --  HCT 32.7* 31.4* --  MCV 78.0 77.1* --  PLT 385 375 --   Cardiac Enzymes:  Lab 03/17/11 1642 03/17/11 1134 03/17/11 0041  CKTOTAL 74 76 82  CKMB 2.3 2.1  1.9  CKMBINDEX -- -- --  TROPONINI <0.30 <0.30 <0.30   BNP:  Lab 03/16/11 1208  POCBNP 89.5   D-Dimer:  Lab 03/16/11 1108  DDIMER 0.42   CBG:  Lab 03/21/11 0545 03/20/11 2154 03/20/11 1639 03/20/11 1146 03/20/11 0601 03/19/11 2140  GLUCAP 142* 151* 80 86 97 88   Hemoglobin A1C:  Lab 03/17/11 0515  HGBA1C 6.7*   Fasting Lipid Panel:  Lab 03/17/11 0515  CHOL 214*  HDL 73  LDLCALC 124*  TRIG 86  CHOLHDL 2.9  LDLDIRECT --   Coagulation:  Lab 03/16/11 1108  LABPROT 12.7  INR 0.93   Anemia Panel:  Lab 03/19/11 1511 03/18/11 0610  VITAMINB12 -- 250  FOLATE -- 10.7  FERRITIN 52 --  TIBC 286 --  IRON 36* --  RETICCTPCT -- 1.3   Micro Results: Recent Results (from the past 240 hour(s))  URINE CULTURE     Status: Normal   Collection Time   03/16/11 11:58 AM      Component Value Range Status Comment   Specimen Description URINE, CLEAN CATCH   Final    Special Requests NONE   Final    Setup Time  [id number]   Final    Colony Count 55,000 COLONIES/ML   Final    Culture     Final    Value: METHICILLIN RESISTANT STAPHYLOCOCCUS AUREUS     Note: RIFAMPIN AND GENTAMICIN SHOULD NOT BE USED AS SINGLE DRUGS FOR TREATMENT OF STAPH INFECTIONS.   Report Status 03/18/2011 FINAL   Final    Organism ID, Bacteria METHICILLIN RESISTANT STAPHYLOCOCCUS AUREUS   Final   MRSA PCR SCREENING     Status: Abnormal   Collection Time   03/16/11  9:48 PM      Component Value Range Status Comment   MRSA by PCR POSITIVE (*) NEGATIVE  Final   TECHNOLOGIST SMEAR REVIEW     Status: Normal   Collection Time   03/18/11 11:00 AM      Component Value Range Status Comment   Path Review TEARDROP CELLS   Final   CULTURE, BLOOD (ROUTINE X 2)     Status: Normal (Preliminary result)   Collection Time   03/18/11  3:06 PM      Component Value Range Status Comment   Specimen Description BLOOD LEFT ARM   Final    Special Requests BOTTLES DRAWN AEROBIC ONLY 8.5CC   Final    Setup Time  [id number]   Final    Culture     Final    Value:        BLOOD CULTURE RECEIVED NO GROWTH TO DATE CULTURE WILL BE HELD FOR 5 DAYS BEFORE ISSUING A FINAL NEGATIVE REPORT   Report Status PENDING   Incomplete   CULTURE, BLOOD (ROUTINE X 2)     Status: Normal (Preliminary result)   Collection Time   03/18/11  3:20 PM      Component Value Range Status Comment   Specimen Description BLOOD RIGHT HAND   Final    Special Requests BOTTLES DRAWN AEROBIC ONLY 9CC   Final    Setup Time [id number]   Final    Culture     Final    Value:        BLOOD CULTURE RECEIVED NO GROWTH TO DATE CULTURE WILL BE HELD FOR 5 DAYS BEFORE ISSUING A FINAL NEGATIVE REPORT   Report Status PENDING   Incomplete    Medications: I have reviewed the patient's current medications. Scheduled Meds:   . Chlorhexidine Gluconate Cloth  6 each Topical Q0600  . insulin aspart  0-9 Units Subcutaneous TID WC  . lipase/protease/amylase  2 capsule Oral TID AC  . lisinopril  20 mg Oral Daily  . mupirocin ointment  1 application Nasal BID  . simvastatin  20 mg Oral q1800   Continuous Infusions:   . sodium chloride 80 mL (03/20/11 2354)  . nitroGLYCERIN Stopped (03/17/11 0934)   PRN Meds:.alum & mag hydroxide-simeth, docusate sodium, HYDROcodone-acetaminophen, morphine, nitroGLYCERIN, ondansetron (ZOFRAN) IV, ondansetron Assessment/Plan: 1. Chest Pain: S/p cardiac cath, showed no CAD. No further CP today. Lipase normal today. CTA was negative though limited by motion artifact. No CP for the last 24 hours.  Pain is likely secondary to her chronic abdominal pain and anxiety.   2.  Abdominal Pain: She has chronic mild abdominal pain and has been eating small meals with her Creon for several months.  She has a follow up appointment with her GI doctor at [facility] on 12/3.  Lipase normal. She is back to her baseline diet and pain is well controlled.   3. Anemia: Hb stable, labs indicate Fe deficiency. Has f/u with gastroenterologist for  EGD  and will likely need Colonoscopy as well as an outpatient.  Lack of dietary iron could also be an issue as well.  4.  MRSA in urine: Urine culture grew MRSA but blood cx no growth to date, and no signs of sepsis.  Question if this is skin contamination. Will likely need to follow this up at her follow up appointment.   5. Ppx: SCDs   LOS: 5 days   Meril Dray 03/21/2011, 10:18 AM

## 2011-03-21 NOTE — Discharge Summary (Signed)
Internal Medicine [facility] Discharge Note  Name: [name] MRN: [id number] DOB: 1955-06-24 55 y.o.  Date of Admission: 03/16/2011 10:38 AM Date of Discharge: 03/21/2011 Attending Physician: Blanch Media  Discharge Diagnosis: Principal Problem:  *Chest pain Active Problems:  DIABETES MELLITUS, TYPE II  HYPERTENSION  PANCREAS DIVISUM  Discharge Medications: Current Discharge Medication List    START taking these medications   Details  pravastatin (PRAVACHOL) 40 MG tablet Take 1 tablet (40 mg total) by mouth daily. Qty: 30 tablet, Refills: 6    sertraline (ZOLOFT) 50 MG tablet Take 1 tablet (50 mg total) by mouth daily. Qty: 30 tablet, Refills: 0      CONTINUE these medications which have NOT CHANGED   Details  amLODipine (NORVASC) 5 MG tablet Take 1 tablet (5 mg total) by mouth daily. Qty: 90 tablet, Refills: 2    calcium carbonate (TUMS - DOSED IN MG ELEMENTAL CALCIUM) 500 MG chewable tablet Chew 1 tablet by mouth 2 (two) times daily as needed.      HYDROcodone-acetaminophen (NORCO) 5-325 MG per tablet Take 1 tablet by mouth every 8 (eight) hours as needed. Please do not fill until 01/27/11 Qty: 90 tablet, Refills: 0   Associated Diagnoses: Congenital anomalies of pancreas    lipase/protease/amylase (CREON) 12000 UNITS CPEP Take 2 capsules by mouth 3 (three) times daily before meals. Take 1 capsule before snacks. Qty: 810 capsule, Refills: 4   Associated Diagnoses: Congenital anomalies of pancreas    lisinopril (PRINIVIL,ZESTRIL) 20 MG tablet Take 1 tablet (20 mg total) by mouth daily. Qty: 90 tablet, Refills: 1    loperamide (IMODIUM A-D) 2 MG tablet Take 2 mg by mouth as needed. For diarrhea - not more than 6 tablets a day     metFORMIN (GLUCOPHAGE) 1000 MG tablet Take 1 tablet (1,000 mg total) by mouth 2 (two) times daily with a meal. Qty: 180 tablet, Refills: 4   Associated Diagnoses: Type II or unspecified type diabetes mellitus without  mention of complication, not stated as uncontrolled    ondansetron (ZOFRAN) 4 MG tablet Take 4 mg by mouth Every 6 hours as needed. Nausea   Associated Diagnoses: Pancreatitis       Disposition and follow-up:   [name] was discharged from [facility] in Stable and improved condition.  She will be followed up in the Internal Medicine Clinic on 04/07/11 at 2:15 pm with Dr. Saralyn Pilar.  At that visit a urine cultures should be redrawn to check for the persistence of the MRSA found on admission.  She should also have a CBC drawn to follow her hemoglobin and consider iron therapy for her iron deficiency.  She also has a follow up appointment with her GI doctor, Dr. Allyson Sabal, at [facility] on 04/04/11.    Follow-up Appointments: Follow-up Information    Follow up with KALIA-REYNOLDS,M SHELLY on 04/07/2011. (2:15 pm)    Contact information:   [address] [place] [address] [phone]         Discharge Orders    Future Appointments: Provider: Department: Dept Phone: Center:   04/07/2011 2:15 PM Suszanne Finch [facility] [phone] [facility]     Future Orders Please Complete By Expires   Diet - low sodium heart healthy      Increase activity slowly      Discharge instructions      Comments:   1. Continue on increasing your diet as you are able to.   2. Start  Zoloft 50 mg 1 tablet daily.  3. Make sure you are taking your Pravastatin for your cholesterol.  Your numbers were above your goal of <130.  Dr. Saralyn Pilar will follow up with you concerning this. 4. Continue all your other medications as prescribed.   Call MD for:  persistant nausea and vomiting      Call MD for:  severe uncontrolled pain        Consultations:    Procedures Performed:  Ct Abdomen Pelvis Wo Contrast  03/18/2011   IMPRESSION:  1.  Small right groin and extraperitoneal pelvic hematoma on the right side. 2.  No retroperitoneal hematoma. 3.  No other  significant abdominal/pelvic findings.  Original Report Authenticated By: P. Loralie Champagne, M.D.   Dg Elbow 2 Views Right  03/18/2011  IMPRESSION: Iodinated contrast extravasation confirmed into the right antecubital fossa, at least 25 ml suspected.  Original Report Authenticated By: Harley Hallmark, M.D.   Ct Angio Chest W/cm &/or Wo Cm  03/18/2011 IMPRESSION:  1.  No evidence of central pulmonary embolus; evaluation for pulmonary embolus is significantly suboptimal due to motion artifact. 2.  Stable benign small right upper lobe pulmonary nodule noted; lungs otherwise clear.  Original Report Authenticated By: Tonia Ghent, M.D.   Dg Chest Port 1 View  03/16/2011 IMPRESSION: No active disease.  No significant change.  Original Report Authenticated By: Natasha Mead, M.D.   Cardiac Cath:  1. Normal left main coronary artery. 2. Normal left anterior descending artery and its branches. 3. Normal left circumflex artery and its branches. 4. Normal right coronary artery. 5. Normal left ventricular systolic function. LVEDP 8 mmHg. Ejection fraction 55%. 6. Non-cardiac chest pain  7. Abdominal aortogram showed no AAA. Dual arterial supply to the left kidney, both vessels patent. Patent Right renal artery. Patent aortoiliac bifurcation.   Admission HPI: [name] is a 55 year old woman with a PMH significant for diabetes, hypertension, hyperlipidemia, GERD, PE, and pancreatic divisum who presented to the [facility] ED after an episode of chest pain while she was at work. 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 She states took her breath away and made her double over. She states that she got sweaty, nauseated, and threw up once. Her co-workers had her sit down and gave her an aspirin and called 911. En route she was given nitroglycerin and aspirin. On arrival she was given dilaudid for  pain and Zofran for nausea. She states that the pain and tightness is better now. She has never had anything like this before. She states that this is different then when she had her PE previously. She states that she has been taking her medications except for her blood pressure medication because she states she was told by her GI doctor not too because of low blood pressure. She denies fevers, chills, cough, headache, diarrhea, constipation, abdominal pain, dysuria, increased frequency, or palpitations.   Hospital Course by problem list: 1. Chest Pain: On admission she was complaining of left sided chest pain.  She has significant risk factors with DM, HTN, HLD, family history and a history of smoking.  ACS was ruled out with serial cardiac enzymes.  She was seen by cardiology and they proceeded to catheretization.  Cath showed normal coronary arteries with no blockages as well as a normal EF.  She has a history of PE  but her Revised Geneva score was 3 and D-dimer was negative.  Because of the persistent chest pain a CTA was done to rule out PE.  It was a suboptimal study but ruled out large PE.  Her pain was likely secondary to her known chronic pancreatitis and anxiety.  She will be started on Zoloft on discharge with titration to effective doses to help her anxiety.  She will also continue her follow up with [facility].  2. Abdominal Pain: She has chronic mild abdominal pain and has been eating small meals with her Creon for several months. Following her chest pain rule out she had an exacerbation of her chronic abdominal pain.  She was made NPO and pain control and resolved within 48 hours.  She was at her baseline on the date of discharge and will be continued to followed by her [facility] GI doctor as well as the [facility].   3. Anemia: On admission she was noted to be anemic.  She has a history of sickle cell trait as well as a question of beta thalassemia trait.  Hgb was stable throughout her stay.  Iron studies did  show that she is iron deficient.  She has been scheduled for a EGD and colonoscopy previously by her GI doctor and will continue that as an outpatient.  She will be evaluated on her follow up appointment to consider iron supplementation therapy.   4. MRSA in urine: Urine culture on admission grew MRSA but blood cx no growth to date, and she has no signs of sepsis. She is MRSA positive on nasal swab so there is the question if this is skin contamination. She will need a repeat UC done in the future to follow this.   5.  Hypertension:  She was mildly hypotensive on admission so her Norvasc was held.  She has trended back to her baseline hypertension and will restart her Norvasc on discharge.  She was maintained on her lisinopril throughout this admission.   Discharge Vitals:  BP 151/82  Pulse 62  Temp(Src) 98 F (36.7 C) (Oral)  Resp 20  Ht 5' (1.524 m)  Wt 111 lb 8 oz (50.576 kg)  BMI 21.78 kg/m2  SpO2 98%  Discharge Labs:  Results for orders placed during the hospital encounter of 03/16/11 (from the past 24 hour(s))  GLUCOSE, CAPILLARY     Status: Normal   Collection Time   03/20/11 11:46 AM      Component Value Range   Glucose-Capillary 86  70 - 99 (mg/dL)  GLUCOSE, CAPILLARY     Status: Normal   Collection Time   03/20/11  4:39 PM      Component Value Range   Glucose-Capillary 80  70 - 99 (mg/dL)  GLUCOSE, CAPILLARY     Status: Abnormal   Collection Time   03/20/11  9:54 PM      Component Value Range   Glucose-Capillary 151 (*) 70 - 99 (mg/dL)   Comment 1 Documented in Chart     Comment 2 Notify RN    GLUCOSE, CAPILLARY     Status: Abnormal   Collection Time   03/21/11  5:45 AM      Component Value Range   Glucose-Capillary 142 (*) 70 - 99 (mg/dL)   Comment 1 Documented in Chart     Comment 2 Notify RN     Signed: Anamaria Dusenbury 03/21/2011, 10:35 AM

## 2011-03-22 ENCOUNTER — Encounter (HOSPITAL_COMMUNITY): Payer: Self-pay | Admitting: Emergency Medicine

## 2011-03-24 LAB — CULTURE, BLOOD (ROUTINE X 2): Culture: NO GROWTH

## 2011-03-25 ENCOUNTER — Encounter: Payer: Self-pay | Admitting: Internal Medicine

## 2011-03-29 ENCOUNTER — Telehealth: Payer: Self-pay | Admitting: *Deleted

## 2011-03-29 NOTE — Telephone Encounter (Signed)
Pt d/c from hospital on 11/11 - 11/19 She has a return to work note from you but it is not specific enough for her employer.  It needs to include more detail. Pt feels she needs to return to her activities slowly.  The note needs to state how many hours a day she can work and if it needs to be light duty. If you have questions please call Nely @ [phone]  Fax letter to Olegario Messier, manager [phone]

## 2011-03-29 NOTE — Telephone Encounter (Signed)
Manager's full name is Maeola Harman

## 2011-03-31 ENCOUNTER — Encounter: Payer: Self-pay | Admitting: Internal Medicine

## 2011-04-01 NOTE — Telephone Encounter (Signed)
Letter written and sent.

## 2011-04-07 ENCOUNTER — Encounter: Payer: Self-pay | Admitting: Internal Medicine

## 2011-04-07 ENCOUNTER — Ambulatory Visit (INDEPENDENT_AMBULATORY_CARE_PROVIDER_SITE_OTHER): Payer: BC Managed Care – PPO | Admitting: Internal Medicine

## 2011-04-07 VITALS — BP 159/91 | HR 87 | Temp 97.0°F | Ht 60.0 in | Wt 117.5 lb

## 2011-04-07 DIAGNOSIS — Q453 Other congenital malformations of pancreas and pancreatic duct: Secondary | ICD-10-CM

## 2011-04-07 DIAGNOSIS — E119 Type 2 diabetes mellitus without complications: Secondary | ICD-10-CM

## 2011-04-07 DIAGNOSIS — Z0000 Encounter for general adult medical examination without abnormal findings: Secondary | ICD-10-CM

## 2011-04-07 DIAGNOSIS — Z23 Encounter for immunization: Secondary | ICD-10-CM

## 2011-04-07 DIAGNOSIS — I10 Essential (primary) hypertension: Secondary | ICD-10-CM

## 2011-04-07 DIAGNOSIS — E785 Hyperlipidemia, unspecified: Secondary | ICD-10-CM

## 2011-04-07 MED ORDER — ONDANSETRON HCL 4 MG PO TABS: 4.0000 mg | ORAL_TABLET | Freq: Four times a day (QID) | ORAL | Status: DC | PRN

## 2011-04-07 MED ORDER — HYDROCODONE-ACETAMINOPHEN 5-325 MG PO TABS: 1.0000 | ORAL_TABLET | Freq: Three times a day (TID) | ORAL | Status: DC | PRN

## 2011-04-07 MED ORDER — PRAVASTATIN SODIUM 40 MG PO TABS: 20.0000 mg | ORAL_TABLET | Freq: Every day | ORAL | Status: DC

## 2011-04-07 NOTE — Assessment & Plan Note (Signed)
Health Maintenance  Topic Date Due  . Tetanus/tdap  07/29/1972  . Ophthalmology Exam  08/20/2010  . Influenza Vaccine  01/31/2011  . Hemoglobin A1c  06/17/2011  . Foot Exam  10/08/2011  . Lipid Panel  03/16/2012  . Mammogram  03/29/2012  . Pap Smear  03/11/2013  . Colonoscopy  02/26/2014  . Pneumococcal Polysaccharide Vaccine (#2) 02/16/2009   Plan:  tetanus booster given, patient asked to schedule diabetic eye exam   States flu shot given during recent hospitalization - will research, and otherwise administer next visit.

## 2011-04-07 NOTE — Assessment & Plan Note (Signed)
Liver Function Tests:    Component Value Date/Time   AST 19 03/16/2011 1108   ALT 14 03/16/2011 1108   ALKPHOS 98 03/16/2011 1108   BILITOT 0.3 03/16/2011 1108   PROT 9.4* 03/16/2011 1108   ALBUMIN 4.6 03/16/2011 1108    Lipid Panel:     Component Value Date/Time   CHOL 214* 03/17/2011 0515   TRIG 86 03/17/2011 0515   HDL 73 03/17/2011 0515   CHOLHDL 2.9 03/17/2011 0515   VLDL 17 03/17/2011 0515   LDLCALC 124* 03/17/2011 0515    Assessment: Lipid Control: not controlled  Progress toward goals: unable to assess  Barriers to meeting goals: taking meds every other day secondary to side effects   Plan:  continue current medications  Recheck FLP in 5 months for now to assess if current dose is enough.

## 2011-04-07 NOTE — Assessment & Plan Note (Signed)
Labs: Lab Results  Component Value Date   HGBA1C 6.7* 03/17/2011   HGBA1C 6.9 01/24/2011   CREATININE 0.62 03/20/2011   CREATININE 0.60 12/02/2010   MICROALBUR 1.18 03/16/2010   MICRALBCREAT 14.6 03/16/2010   CHOL 214* 03/17/2011   HDL 73 03/17/2011   TRIG 86 03/17/2011    Last eye exam and foot exam:    Component Value Date/Time   HMDIABEYEEXA no diabetic retinopathy 08/19/2009   HMDIABFOOTEX done 04/14/2010     Assessment:  Disease Control: controlled  Progress toward goals: at goal  Barriers to meeting goals: no barriers identified   Plan: Glucometer log was not reviewed today, as pt did not have glucometer available for review.      continue current medications  reminded to get eye exam and reminded to bring blood glucose meter & log to each visit

## 2011-04-07 NOTE — Assessment & Plan Note (Signed)
BP Readings from Last 3 Encounters:  04/07/11 159/91  03/21/11 151/82  03/21/11 151/82    Basic Metabolic Panel:    Component Value Date/Time   NA 141 03/20/2011 0715   K 4.2 03/20/2011 0715   CL 104 03/20/2011 0715   CO2 27 03/20/2011 0715   BUN 5* 03/20/2011 0715   CREATININE 0.62 03/20/2011 0715   CREATININE 0.60 12/02/2010 1701   GLUCOSE 100* 03/20/2011 0715   CALCIUM 9.5 03/20/2011 0715    Assessment: Hypertension Control: not controlled  Progress toward goals: unchanged  Barriers to meeting goals: Lack of medication understanding, the patient understood that she is supposed to take her lisinopril and amlodipine on a when necessary basis. Therefore she has not been taking them regularly.   Plan:  Discontinue amlodipine.  Continue lisinopril at current dosage.  Will recheck blood pressure soon, with consideration for escalation of lisinopril as indicated.

## 2011-04-07 NOTE — Patient Instructions (Signed)
   Please follow-up at the clinic in 2 months, at which time we will reevaluate your blood pressure.  STOP amlodipine.  Take Lisinopril once daily.  Please follow-up in the clinic sooner if needed.  If you have been started on new medication(s), and you develop symptoms concerning for allergic reaction, including, but not limited to, throat closing, tongue swelling, rash, please stop the medication immediately and call the clinic at [phone], and go to the ER.  If you are diabetic, please bring your meter to your next visit.  If symptoms worsen, or new symptoms arise, please call the clinic or go to the ER.  Please bring all of your medications in a bag to your next visit.

## 2011-04-07 NOTE — Assessment & Plan Note (Addendum)
The patient apparently is supposed to have some surgical procedure at [facility] in January. I do not have records to corroborate her story. States that she still is requiring Vicodin on an as-needed basis.  Will refill Vicodin today.  Counseled that I do not plan to continue this medication long-term, only until she gets her surgery.  Will get a UDS during next clinic visit.  Will discontinue Phenergan, add when necessary Zofran.  Will check the [facility] narcotics database to see if the patient is getting prescriptions anywhere else.

## 2011-04-07 NOTE — Progress Notes (Signed)
Subjective:   Patient ID: [name] female   DOB: 1956/01/15 55 y.o.   MRN: [id number]  HPI: [name] is a 55 y.o. with a PMHx of diet controlled DMII, HLD, HTN, chronic pancreatitis 2/2 pancreatic divisum, who presented to clinic today for the following:  1) HTN - Patient does check blood pressure regularly at home - 120-140s mmHg. Currently taking amlodipine (Norvase) and lisinopril (Prinivil) which she is taking PRN when BPs are 140s mmHg. states that she was recommended this regimen on hospital discharge, however discharge summary does not indicate when necessary antihypertensives. denies headaches, dizziness, lightheadedness, chest pain, shortness of breath.  does not request refills today.  2) Chronic pancreatitis - Patient was hospitalized at [facility] from November 14 - 19, 2012  for evaluation of chest pain, which was determined to be secondary to chronic pancreatitis and anxiety. Had cath done during this hospital course - that showed clean coronaries, and CTA was negative for PE. Since hospital discharge, patient does note significant improvement of symptoms. is taking recommended medications regularly.   Today, the patient describes some intermittent pain, and abdominal discomfort. She is however, able to keep down food and and liquid. She is followed at [facility], and is followed by GI there. She is planned for some surgical procedure.    3) HLD - currently taking Pravachol (pravastatin), she is taking it every other day because of abdominal discomfort. Denies chest pain, difficulty breathing, palpitations, tachycardia, and muscle pains. Does not request refills today.   4) Preventative care - is due for eye exam, wants to defer until next month at least.    Review of Systems: Per HPI.  Current Outpatient Medications: Medication Sig  . amLODipine (NORVASC) 5 MG tablet Take 1 tablet (5 mg total) by mouth daily.  . calcium carbonate (TUMS - DOSED IN MG ELEMENTAL CALCIUM)  500 MG chewable tablet Chew 1 tablet by mouth 2 (two) times daily as needed.    Marland Kitchen HYDROcodone-acetaminophen (NORCO) 5-325 MG per tablet Take 1 tablet by mouth every 8 (eight) hours as needed. Please do not fill until 01/27/11  . lipase/protease/amylase (CREON) 12000 UNITS CPEP Take 2 capsules by mouth 3 (three) times daily before meals. Take 1 capsule before snacks.  Marland Kitchen lisinopril (PRINIVIL,ZESTRIL) 20 MG tablet Take 1 tablet (20 mg total) by mouth daily.  Marland Kitchen loperamide (IMODIUM A-D) 2 MG tablet Take 2 mg by mouth as needed. For diarrhea - not more than 6 tablets a day   . metFORMIN (GLUCOPHAGE) 1000 MG tablet Take 1 tablet (1,000 mg total) by mouth 2 (two) times daily with a meal.  . pravastatin (PRAVACHOL) 40 MG tablet Take 1 tablet (40 mg total) by mouth daily.  . promethazine (PHENERGAN) 25 MG tablet Take 25 mg by mouth Every 6 hours as needed.    Allergies: Allergen Reactions  . Butalbital-Apap-Caffeine Other (See Comments)    unknown  . Shrimp Flavor Other (See Comments)    unknown  . Sulfonamide Derivatives Other (See Comments)    unknown    Past Medical History  Diagnosis Date  . Hypertension   . HLD (hyperlipidemia) 2009  . Sickle cell trait   . Migraine headache   . Pulmonary embolus 2006    08/2004 -   Two areas of V/Q mismatch. Findings compatible  with high probability for pulmonary embolus.; pt was on coumadin for 1 year  . Pancreatic divisum     S/P ERCP with stenting 07/2010 at [facility], then stent removal (08/05/2010)  .  Diabetes mellitus 2010    well controlled  . Seizure disorder   . History of migraine headaches   . Anemia     beta thallasemia  . Beta thalassemia   . CVA (cerebral infarction) 1993    Per report by patient she had a stroke and prolonged rehab course eventually resolving her right sided weakness, as per her hx obtained 08/2007  . Asthma     childhood asthma  . Shortness of breath   . Cancer     of uterus  . GERD (gastroesophageal reflux  disease)   . Pneumonia   . Stroke     1993, very small amount of left sided weakness  . Blood dyscrasia     thalasemia, sickle cell trait    Past Surgical History  Procedure Date  . Abdominal hysterectomy age 55 yo    2/2 endometriosis  . Pancreatic stent placement/removal     2011 and 2012  . Tumors     breast tumors removed  in 1980's     Objective:   Physical Exam: Filed Vitals:   04/07/11 1411  BP: 159/91  Pulse: 87  Temp: 97 F (36.1 C)      General: Vital signs reviewed and noted. Well-developed, well-nourished, in no acute distress; alert, appropriate and cooperative throughout examination.  Head: Normocephalic, atraumatic.  Lungs:  Normal respiratory effort. Clear to auscultation BL without crackles or wheezes.  Heart: RRR. S1 and S2 normal without gallop, or rubs. (+) murmur.  Abdomen:  BS normoactive. Soft, Nondistended, non-tender.  No masses or organomegaly.  Extremities: No pretibial edema.    Assessment & Plan:   Case and plan of care discussed with Dr. Margarito Liner.

## 2011-04-19 ENCOUNTER — Encounter: Payer: Self-pay | Admitting: *Deleted

## 2011-04-19 NOTE — Progress Notes (Unsigned)
Pt called for refill on Vicodin, she states last refill 11/19. I called Dr Saralyn Pilar to see if Rx written on 12/5 was called in and could pt get vicodin on the 19th. Verfied with [facility] that last refill was 11/19 and they don't have the Rx written on 12/5 Per order of Dr Saralyn Pilar Vicodin Rx was called in to be filled 12/19 with no refills.  Pt informed and asked only to take as needed.

## 2011-06-07 ENCOUNTER — Telehealth: Payer: Self-pay | Admitting: *Deleted

## 2011-06-07 ENCOUNTER — Ambulatory Visit (INDEPENDENT_AMBULATORY_CARE_PROVIDER_SITE_OTHER): Payer: BC Managed Care – PPO | Admitting: Internal Medicine

## 2011-06-07 ENCOUNTER — Encounter: Payer: Self-pay | Admitting: Internal Medicine

## 2011-06-07 VITALS — BP 164/96 | HR 107 | Temp 97.2°F | Ht 60.0 in | Wt 113.4 lb

## 2011-06-07 DIAGNOSIS — R109 Unspecified abdominal pain: Secondary | ICD-10-CM

## 2011-06-07 DIAGNOSIS — E785 Hyperlipidemia, unspecified: Secondary | ICD-10-CM

## 2011-06-07 DIAGNOSIS — E119 Type 2 diabetes mellitus without complications: Secondary | ICD-10-CM

## 2011-06-07 DIAGNOSIS — Z0000 Encounter for general adult medical examination without abnormal findings: Secondary | ICD-10-CM

## 2011-06-07 DIAGNOSIS — I10 Essential (primary) hypertension: Secondary | ICD-10-CM

## 2011-06-07 LAB — GLUCOSE, CAPILLARY: Glucose-Capillary: 75 mg/dL (ref 70–99)

## 2011-06-07 LAB — LIPID PANEL
Cholesterol: 238 mg/dL — ABNORMAL HIGH (ref 0–200)
HDL: 96 mg/dL (ref >39)
Total CHOL/HDL Ratio: 2.5 Ratio
Triglycerides: 43 mg/dL (ref <150)
VLDL: 9 mg/dL (ref 0–40)

## 2011-06-07 MED ORDER — PROMETHAZINE HCL 25 MG PO TABS: 25.0000 mg | ORAL_TABLET | Freq: Four times a day (QID) | ORAL | Status: DC | PRN

## 2011-06-07 MED ORDER — HYDROCODONE-ACETAMINOPHEN 5-325 MG PO TABS: 1.0000 | ORAL_TABLET | Freq: Three times a day (TID) | ORAL | Status: DC | PRN

## 2011-06-07 MED ORDER — LISINOPRIL 20 MG PO TABS: 20.0000 mg | ORAL_TABLET | Freq: Every day | ORAL | Status: DC

## 2011-06-07 NOTE — Assessment & Plan Note (Addendum)
Labs: Lab Results  Component Value Date   HGBA1C 6.7* 03/17/2011   HGBA1C 6.9 01/24/2011   CREATININE 0.62 03/20/2011   CREATININE 0.60 12/02/2010   MICROALBUR 1.18 03/16/2010   MICRALBCREAT 14.6 03/16/2010   CHOL 214* 03/17/2011   HDL 73 03/17/2011   TRIG 86 03/17/2011    Last eye exam and foot exam:    Component Value Date/Time   HMDIABEYEEXA no diabetic retinopathy 08/19/2009   HMDIABFOOTEX done 04/14/2010     Assessment:  Disease Control: controlled  Progress toward goals: improved  Barriers to meeting goals: no barriers identified   Plan: Glucometer log was not reviewed today, as pt did not have glucometer available for review.      Given chronic unexplained abdominal pain, and potential potentiation of symptoms with metformin, will trial off of metformin. She has never had significantly poor control of her blood sugars and currently has decreased oral intake, therefore, I think that short cessation of her metformin will be ok.   Will recheck blood sugar and symptoms in a few weeks. If significantly uncontrolled blood sugars, can consider addition of something such as glipizide.   Referral made for diabetic eye exam.

## 2011-06-07 NOTE — Assessment & Plan Note (Signed)
Health Maintenance  Topic Date Due  . Ophthalmology Exam  08/20/2010  . Hemoglobin A1c  06/17/2011  . Foot Exam  10/08/2011  . Influenza Vaccine  01/31/2012  . Lipid Panel  03/16/2012  . Mammogram  03/29/2012  . Pap Smear  03/11/2013  . Colonoscopy  02/26/2014  . Tetanus/tdap  04/06/2021  . Pneumococcal Polysaccharide Vaccine (#2) 02/16/2009     Assessment:  UTD for all except diabetic eye exam  Plan:  Ordered ophtho referral today for exam, pt now agreeable to do so.

## 2011-06-07 NOTE — Progress Notes (Signed)
Subjective:   Patient ID: [name] female   DOB: January 21, 1956 56 y.o.   MRN: [id number]  HPI: [name] is a 56 y.o. with a PMHx of diet controlled DMII, HLD, HTN, chronic pancreatitis 2/2 pancreatic divisum, who presented to clinic today for the following:  1) HTN - Patient does not check systolic blood pressure regularly at home. Currently taking only lisinopril - was stopped of amlodipine 10 mg during her last admission to [facility] in 1/11. denies headaches, dizziness, lightheadedness, chest pain, shortness of breath.  does not request refills today.   2) Chronic abdominal pain - Patient was hospitalized at [facility] from January 11-13, 2013 for evaluation of abdominal pain, determined to be secondary to chronic pancreatitis. EGD and colonoscopy were performed and were grossly normal (I do not have reports, only discharge summary indicating this), biopsies were taken and are pending at this time. CT abdomen was also performed and was negative for acute abnormality to explain abdominal pain symptoms. She was scheduled for follow-up with her GI doctor at [facility] on 06/20/2011 - this is pending at this time.  Today, the patient describes current sharp, LUQ and back pain that started yesterday. Denies hematuria, dysuria, malodorous urine. Decreased oral oral intake secondary to pain, also confirms nausea but no diarrhea, vomiting, fevers, chills. Able to keep down liquids.    3) HLD - currently taking Pravachol (pravastatin), she is taking it every other day because of abdominal discomfort. Denies chest pain, difficulty breathing, palpitations, tachycardia, and muscle pains. Does not request refills today.   4) Preventative care - is due for eye exam, wants to defer until next month at least.   5) DM, II - last A1c is 6.7 - Not checking blood sugars at home. Currently taking metformin (Glucophage, Riomet) 1000mg  BID. Admits to nausea. Denies polyuria, polydipsia, vomiting, diarrhea.  does  not request refills today.   Review of Systems: Per HPI.  Current Outpatient Medications: Medication Sig  . calcium carbonate (TUMS - DOSED IN MG ELEMENTAL CALCIUM) 500 MG chewable tablet Chew 1 tablet by mouth 2 (two) times daily as needed.    Marland Kitchen HYDROcodone-acetaminophen (NORCO) 5-325 MG per tablet Take 1 tablet by mouth every 8 (eight) hours as needed. Please do not fill until 04/28/2011  . lipase/protease/amylase (CREON) 12000 UNITS CPEP Take 2 capsules by mouth 3 (three) times daily before meals. Take 1 capsule before snacks.  Marland Kitchen lisinopril (PRINIVIL,ZESTRIL) 20 MG tablet Take 1 tablet (20 mg total) by mouth daily.  . metFORMIN (GLUCOPHAGE) 1000 MG tablet Take 1 tablet (1,000 mg total) by mouth 2 (two) times daily with a meal.  . pravastatin (PRAVACHOL) 40 MG tablet Take 0.5 tablets (20 mg total) by mouth daily.    Allergies  Allergen Reactions  . Butalbital-Apap-Caffeine Other (See Comments)    unknown  . Shrimp Flavor Other (See Comments)    unknown  . Sulfonamide Derivatives Other (See Comments)    unknown    Past Medical History  Diagnosis Date  . Hypertension   . HLD (hyperlipidemia) 2009  . Sickle cell trait   . Migraine headache   . Pulmonary embolus 2006    08/2004 -   Two areas of V/Q mismatch. Findings compatible  with high probability for pulmonary embolus.; pt was on coumadin for 1 year  . Pancreatic divisum     S/P ERCP with stenting 07/2010 at [facility], then stent removal (08/05/2010)  . Diabetes mellitus 2010    well controlled  . Seizure disorder   .  History of migraine headaches   . Beta thalassemia   . CVA (cerebral infarction) 1993    Per report by patient she had a stroke and prolonged rehab course eventually resolving her right sided weakness, as per her hx obtained 08/2007  . Asthma     childhood asthma  . Cancer     of uterus  . GERD (gastroesophageal reflux disease)   . Stroke     1993, very small amount of left sided weakness  . Blood  dyscrasia     thalasemia, sickle cell trait    Past Surgical History  Procedure Date  . Abdominal hysterectomy age 56 yo    2/2 endometriosis  . Pancreatic stent placement/removal     2011 and 2012  . Tumors     breast tumors removed  in 1980's     Objective:   Physical Exam: Filed Vitals:   06/07/11 1459  BP: 164/96  Pulse: 107  Temp: 97.2 F (36.2 C)      General: Vital signs reviewed and noted. Well-developed, well-nourished, in no acute distress; alert, appropriate and cooperative throughout examination.  Head: Normocephalic, atraumatic.  Lungs:  Normal respiratory effort. Clear to auscultation BL without crackles or wheezes.  Heart: RRR. S1 and S2 normal without gallop, rubs. (+) murmur.  Abdomen:  BS normoactive. Soft, Nondistended.  No masses or organomegaly. Diffuse tenderness to palpation.  Extremities: No pretibial edema.    Assessment & Plan:  Case and plan of care discussed with Dr. Blanch Media.

## 2011-06-07 NOTE — Assessment & Plan Note (Signed)
BP Readings from Last 3 Encounters:  06/07/11 164/96  04/07/11 159/91  03/21/11 151/82    Basic Metabolic Panel:    Component Value Date/Time   NA 141 03/20/2011 0715   K 4.2 03/20/2011 0715   CL 104 03/20/2011 0715   CO2 27 03/20/2011 0715   BUN 5* 03/20/2011 0715   CREATININE 0.62 03/20/2011 0715   CREATININE 0.60 12/02/2010 1701   GLUCOSE 100* 03/20/2011 0715   CALCIUM 9.5 03/20/2011 0715    Assessment: Hypertension Control: not controlled  Progress toward goals: unchanged  Barriers to meeting goals: was recently hospitalized at [facility], who noted her blood pressure to be soft, and discontinued her Amlodipine.  Difficult to assess need right now given her acute pain and that she always has pain when she comes in. However, will likely need additional medication, vs escalation of dosage next visit if continued elevation.   Plan:  continue current medications  Recheck next visit, consider escalation of Lisinopril dosage at that time if persistent elevation.

## 2011-06-07 NOTE — Assessment & Plan Note (Signed)
Patient's chronic abdominal pain is still unexplained at this time - differential diagnoses include pancreatic insufficiency versus thyroid disease versus medication side effect (for metformin or her narcotics). Currently, there is no evidence of acute infectious etiology as the patient remains without fevers, chills, vomiting, diarrhea. Other more rare diagnoses such as adrenal insufficiency or posterior are also considered, however felt to be much less likely. She does have a known history of pancreatic divisum, with frequent acute pancreatitis episodes, however, during this hospitalization she had a flare of her chronic abdominal pain with a normal lipase and amylase. She also is on chronic pancreatic enzyme supplementation. During this last hospitalization she had an EGD, colonoscopy, CT abdomen performed at [facility]: All of these were essentially normal without indication of cause of her abdominal pain. She is to followup with her gastroenterologist at [facility] on 06/20/2011.  Will check thyroid function test - potentially thyroid dysfunction can lead to this nonspecific abdominal pain symptoms.  Will discontinue metformin for now.  Will recommend continued oral intake as much as tolerated.  Will refill her Phenergan and Norco today.  Will followup in 2-3 weeks after her GI followup at [facility] - patient has been advised to asked Dr. Gery Pray to forward clinic note to [facility].  She should come back to the clinic or to [facility] or to the ER if symptoms worsen or new symptoms arise.

## 2011-06-07 NOTE — Assessment & Plan Note (Signed)
Liver Function Tests:    Component Value Date/Time   AST 19 03/16/2011 1108   ALT 14 03/16/2011 1108   ALKPHOS 98 03/16/2011 1108   BILITOT 0.3 03/16/2011 1108   PROT 9.4* 03/16/2011 1108   ALBUMIN 4.6 03/16/2011 1108    Lipid Panel:     Component Value Date/Time   CHOL 214* 03/17/2011 0515   TRIG 86 03/17/2011 0515   HDL 73 03/17/2011 0515   CHOLHDL 2.9 03/17/2011 0515   VLDL 17 03/17/2011 0515   LDLCALC 124* 03/17/2011 0515     Assessment: Lipid Control: not controlled  Progress toward goals: unable to assess - was just started on pravastatin in December and no new FLP since that time  Barriers to meeting goals: occasionally unable to keep down medications when her pancreatitis flares up. this has not recently been the case.   Plan:  continue current medications  Check lipid panel today to assess if adequate control with her statin that was newly started.

## 2011-06-07 NOTE — Telephone Encounter (Signed)
Pt calls and states on message that she has been calling for an appt for a couple of days and never can speak w/ anyone, i tried to call her back and didn't get an answer, i did leave a message for rtc

## 2011-06-07 NOTE — Patient Instructions (Signed)
   Please follow-up at the clinic in 2-3 weeks (after you have seen Dr. Allyson Sabal at [facility]), at which time we will reevaluate your blood pressure, diabetes, abdominal pain.  There have been changes in your medications.  STOP metformin  Take Norco only as prescribed.  Please follow-up in the clinic sooner if needed.  If you have been started on new medication(s), and you develop symptoms concerning for allergic reaction, including, but not limited to, throat closing, tongue swelling, rash, please stop the medication immediately and call the clinic at [phone], and go to the ER.  If you are diabetic, please bring your meter to your next visit.  If symptoms worsen, or new symptoms arise, please call the clinic or go to the ER.  Please bring all of your medications in a bag to your next visit.

## 2011-06-09 ENCOUNTER — Other Ambulatory Visit: Payer: Self-pay | Admitting: Internal Medicine

## 2011-06-09 DIAGNOSIS — E785 Hyperlipidemia, unspecified: Secondary | ICD-10-CM

## 2011-06-09 MED ORDER — PRAVASTATIN SODIUM 40 MG PO TABS: 40.0000 mg | ORAL_TABLET | Freq: Every day | ORAL | Status: DC

## 2011-06-09 NOTE — Progress Notes (Signed)
Patient's cholesterol is not optimally controlled on her pravastatin 20mg , therefore, will need to escalate to the 40mg  dose. Patient was called and made aware of this necessary change. The patient understands why this medication change will be made. She is advised to be aware of any allergic symptoms or myalgias, other symptoms suggesting medication intolerance.  All questions were answered and concerns addressed.  Johnette Abraham, D.O.

## 2011-06-16 ENCOUNTER — Telehealth: Payer: Self-pay | Admitting: Dietician

## 2011-06-16 NOTE — Telephone Encounter (Signed)
Thank you!  Shelly Kalia-Reynolds, D.O.  

## 2011-06-16 NOTE — Telephone Encounter (Signed)
Dr. Laural Benes at [facility] saw patient yesterday- when note finished, they will fax to [name]; attention Dr. Saralyn Pilar.

## 2011-07-15 ENCOUNTER — Encounter (HOSPITAL_COMMUNITY): Payer: Self-pay | Admitting: Emergency Medicine

## 2011-07-15 ENCOUNTER — Emergency Department (HOSPITAL_COMMUNITY): Payer: BC Managed Care – PPO

## 2011-07-15 ENCOUNTER — Emergency Department (HOSPITAL_COMMUNITY)
Admission: EM | Admit: 2011-07-15 | Discharge: 2011-07-15 | Disposition: A | Discharge status: Home / Self Care | Payer: BC Managed Care – PPO | Attending: Emergency Medicine | Admitting: Emergency Medicine

## 2011-07-15 DIAGNOSIS — D573 Sickle-cell trait: Secondary | ICD-10-CM | POA: Insufficient documentation

## 2011-07-15 DIAGNOSIS — J45909 Unspecified asthma, uncomplicated: Secondary | ICD-10-CM | POA: Insufficient documentation

## 2011-07-15 DIAGNOSIS — G40909 Epilepsy, unspecified, not intractable, without status epilepticus: Secondary | ICD-10-CM | POA: Insufficient documentation

## 2011-07-15 DIAGNOSIS — Z8673 Personal history of transient ischemic attack (TIA), and cerebral infarction without residual deficits: Secondary | ICD-10-CM | POA: Insufficient documentation

## 2011-07-15 DIAGNOSIS — G43909 Migraine, unspecified, not intractable, without status migrainosus: Secondary | ICD-10-CM | POA: Insufficient documentation

## 2011-07-15 DIAGNOSIS — H53149 Visual discomfort, unspecified: Secondary | ICD-10-CM | POA: Insufficient documentation

## 2011-07-15 DIAGNOSIS — E785 Hyperlipidemia, unspecified: Secondary | ICD-10-CM | POA: Insufficient documentation

## 2011-07-15 DIAGNOSIS — Z86711 Personal history of pulmonary embolism: Secondary | ICD-10-CM | POA: Insufficient documentation

## 2011-07-15 DIAGNOSIS — I10 Essential (primary) hypertension: Secondary | ICD-10-CM | POA: Insufficient documentation

## 2011-07-15 DIAGNOSIS — R110 Nausea: Secondary | ICD-10-CM | POA: Insufficient documentation

## 2011-07-15 DIAGNOSIS — E119 Type 2 diabetes mellitus without complications: Secondary | ICD-10-CM | POA: Insufficient documentation

## 2011-07-15 DIAGNOSIS — Z8542 Personal history of malignant neoplasm of other parts of uterus: Secondary | ICD-10-CM | POA: Insufficient documentation

## 2011-07-15 DIAGNOSIS — Z79899 Other long term (current) drug therapy: Secondary | ICD-10-CM | POA: Insufficient documentation

## 2011-07-15 DIAGNOSIS — K219 Gastro-esophageal reflux disease without esophagitis: Secondary | ICD-10-CM | POA: Insufficient documentation

## 2011-07-15 MED ORDER — PROMETHAZINE HCL 25 MG PO TABS: 25.0000 mg | ORAL_TABLET | Freq: Four times a day (QID) | ORAL | Status: DC | PRN

## 2011-07-15 MED ORDER — METOCLOPRAMIDE HCL 5 MG/ML IJ SOLN
10.0000 mg | Freq: Once | INTRAMUSCULAR | Status: AC
  Administered 2011-07-15: 10 mg via INTRAMUSCULAR
  Filled 2011-07-15: qty 2

## 2011-07-15 MED ORDER — PROMETHAZINE HCL 25 MG/ML IJ SOLN
25.0000 mg | Freq: Once | INTRAMUSCULAR | Status: AC
  Administered 2011-07-15: 25 mg via INTRAMUSCULAR
  Filled 2011-07-15 (×2): qty 1

## 2011-07-15 MED ORDER — DIPHENHYDRAMINE HCL 50 MG/ML IJ SOLN
25.0000 mg | Freq: Once | INTRAMUSCULAR | Status: DC
  Filled 2011-07-15: qty 1

## 2011-07-15 MED ORDER — DEXAMETHASONE SODIUM PHOSPHATE 10 MG/ML IJ SOLN
10.0000 mg | Freq: Once | INTRAMUSCULAR | Status: AC
  Administered 2011-07-15: 10 mg via INTRAMUSCULAR
  Filled 2011-07-15: qty 1

## 2011-07-15 MED ORDER — HALOPERIDOL LACTATE 5 MG/ML IJ SOLN
5.0000 mg | Freq: Once | INTRAMUSCULAR | Status: AC
  Administered 2011-07-15: 5 mg via INTRAMUSCULAR
  Filled 2011-07-15: qty 1

## 2011-07-15 MED ORDER — KETOROLAC TROMETHAMINE 60 MG/2ML IM SOLN
60.0000 mg | Freq: Once | INTRAMUSCULAR | Status: AC
  Administered 2011-07-15: 60 mg via INTRAMUSCULAR
  Filled 2011-07-15: qty 2

## 2011-07-15 MED ORDER — DIPHENHYDRAMINE HCL 25 MG PO CAPS
25.0000 mg | ORAL_CAPSULE | Freq: Once | ORAL | Status: AC
  Administered 2011-07-15: 25 mg via ORAL
  Filled 2011-07-15: qty 1

## 2011-07-15 MED ORDER — DIPHENHYDRAMINE HCL 50 MG/ML IJ SOLN
25.0000 mg | Freq: Once | INTRAMUSCULAR | Status: AC
  Administered 2011-07-15: 25 mg via INTRAMUSCULAR

## 2011-07-15 NOTE — Discharge Instructions (Signed)
Drink plenty of fluids. Rest. Continue excedrin migraine. Take phenergan as prescribed as needed for extra pain and nausea relief. Follow up with primary care doctor.   Migraine Headache A migraine headache is an intense, throbbing pain on one or both sides of your head. The exact cause of a migraine headache is not always known. A migraine may be caused when nerves in the brain become irritated and release chemicals that cause swelling within blood vessels, causing pain. Many migraine sufferers have a family history of migraines. Before you get a migraine you may or may not get an aura. An aura is a group of symptoms that can predict the beginning of a migraine. An aura may include:  Visual changes such as:   Flashing lights.   Bright spots or zig-zag lines.   Tunnel vision.   Feelings of numbness.   Trouble talking.   Muscle weakness.  SYMPTOMS  Pain on one or both sides of your head.   Pain that is pulsating or throbbing in nature.   Pain that is severe enough to prevent daily activities.   Pain that is aggravated by any daily physical activity.   Nausea (feeling sick to your stomach), vomiting, or both.   Pain with exposure to bright lights, loud noises, or activity.   General sensitivity to bright lights or loud noises.  MIGRAINE TRIGGERS Examples of triggers of migraine headaches include:   Alcohol.   Smoking.   Stress.   It may be related to menses (female menstruation).   Aged cheeses.   Foods or drinks that contain nitrates, glutamate, aspartame, or tyramine.   Lack of sleep.   Chocolate.   Caffeine.   Hunger.   Medications such as nitroglycerine (used to treat chest pain), birth control pills, estrogen, and some blood pressure medications.  DIAGNOSIS  A migraine headache is often diagnosed based on:  Symptoms.   Physical examination.   A computerized X-ray scan (computed tomography, CT) of your head.  TREATMENT  Medications can help  prevent migraines if they are recurrent or should they become recurrent. Your caregiver can help you with a medication or treatment program that will be helpful to you.   Lying down in a dark, quiet room may be helpful.   Keeping a headache diary may help you find a trend as to what may be triggering your headaches.  SEEK IMMEDIATE MEDICAL CARE IF:   You have confusion, personality changes or seizures.   You have headaches that wake you from sleep.   You have an increased frequency in your headaches.   You have a stiff neck.   You have a loss of vision.   You have muscle weakness.   You start losing your balance or have trouble walking.   You feel faint or pass out.  MAKE SURE YOU:   Understand these instructions.   Will watch your condition.   Will get help right away if you are not doing well or get worse.  Document Released: 04/18/2005 Document Revised: 04/07/2011 Document Reviewed: 12/02/2008 [facility] Patient Information 2012 [place], [place].

## 2011-07-15 NOTE — ED Provider Notes (Signed)
History     CSN: [id number]  Arrival date & time 07/15/11  9562   First MD Initiated Contact with Patient 07/15/11 [phone]      Chief Complaint  Patient presents with  . Headache    (Consider location/radiation/quality/duration/timing/severity/associated sxs/prior treatment) Patient is a 56 y.o. female presenting with headaches. The history is provided by the patient.  Headache  This is a new problem. The current episode started yesterday. The problem occurs constantly. The problem has not changed since onset.The headache is associated with bright light, activity and loud noise. The pain is located in the right unilateral region. The quality of the pain is described as sharp. The pain is at a severity of 9/10. The pain is moderate. The pain does not radiate. Associated symptoms include nausea. Pertinent negatives include no fever, no near-syncope, no syncope and no vomiting. She has tried acetaminophen and aspirin for the symptoms.  Pt states she is having a typical for her migraine headache. States it started with an aura, she began seeing spots in her vision, then got a tingly sensation and nausea, those resolved and she developed gradual onset of right sided headache that has persisted through the night. Pt states she tried Excedrin migraine with no relief. Pt denies headache worsening, denies neck pain or stiffness, denies head injury, denies weakness or numbness of extremities.  Past Medical History  Diagnosis Date  . Hypertension   . HLD (hyperlipidemia) 2009  . Sickle cell trait   . Migraine headache   . Pulmonary embolus 2006    08/2004 -   Two areas of V/Q mismatch. Findings compatible  with high probability for pulmonary embolus.; pt was on coumadin for 1 year  . Pancreatic divisum     S/P ERCP with stenting 07/2010 at [facility], then stent removal (08/05/2010)  . Diabetes mellitus 2010    well controlled  . Seizure disorder   . History of migraine headaches   . Beta  thalassemia   . CVA (cerebral infarction) 1993    Per report by patient she had a stroke and prolonged rehab course eventually resolving her right sided weakness, as per her hx obtained 08/2007  . Asthma     childhood asthma  . Cancer     of uterus  . GERD (gastroesophageal reflux disease)   . Stroke     1993, very small amount of left sided weakness  . Blood dyscrasia     thalasemia, sickle cell trait    Past Surgical History  Procedure Date  . Abdominal hysterectomy age 62 yo    2/2 endometriosis  . Pancreatic stent placement/removal     2011 and 2012  . Tumors     breast tumors removed  in 1980's    Family History  Problem Relation Age of Onset  . Prostate cancer Father   . Sickle cell anemia Brother   . Lung cancer Maternal Aunt   . Lung cancer Maternal Uncle   . Breast cancer Maternal Grandmother     History  Substance Use Topics  . Smoking status: Former Smoker -- 0.5 packs/day for 30 years    Quit date: 05/02/2000  . Smokeless tobacco: Not on file  . Alcohol Use: No    OB History    Grav Para Term Preterm Abortions TAB SAB Ect Mult Living                  Review of Systems  Constitutional: Negative for fever and chills.  HENT:  Negative for neck pain and neck stiffness.   Eyes: Positive for photophobia. Negative for pain and visual disturbance.  Respiratory: Negative.   Cardiovascular: Negative.  Negative for syncope and near-syncope.  Gastrointestinal: Positive for nausea. Negative for vomiting and abdominal pain.  Genitourinary: Negative.   Musculoskeletal: Negative.   Skin: Negative.   Neurological: Positive for headaches. Negative for dizziness, speech difficulty, weakness, light-headedness and numbness.  Psychiatric/Behavioral: Negative.     Allergies  Shrimp flavor; Fioricet; and Sulfonamide derivatives  Home Medications   Current Outpatient Rx  Name Route Sig Dispense Refill  . CALCIUM CARBONATE ANTACID 500 MG PO CHEW Oral Chew 1 tablet  by mouth 2 (two) times daily as needed. Indigestion    . PANCRELIPASE (LIP-PROT-AMYL) 12000 UNITS PO CPEP Oral Take 2 capsules by mouth 3 (three) times daily before meals. Take 1 capsule before snacks. 810 capsule 4  . LISINOPRIL 20 MG PO TABS Oral Take 1 tablet (20 mg total) by mouth daily. 30 tablet 5  . PRAVASTATIN SODIUM 40 MG PO TABS Oral Take 1 tablet (40 mg total) by mouth daily. 30 tablet 6  . PROMETHAZINE HCL 25 MG PO TABS Oral Take 1 tablet (25 mg total) by mouth every 6 (six) hours as needed for nausea. 40 tablet 0  . HYDROCODONE-ACETAMINOPHEN 5-325 MG PO TABS Oral Take 1 tablet by mouth every 8 (eight) hours as needed. For pain. Please do not fill until 04/28/2011    . METFORMIN HCL 1000 MG PO TABS Oral Take 1,000 mg by mouth 2 (two) times daily with a meal.      BP 174/87  Pulse 76  Temp(Src) 97.4 F (36.3 C) (Oral)  Resp 18  SpO2 99%  Physical Exam  Nursing note and vitals reviewed. Constitutional: She is oriented to person, place, and time. She appears well-developed and well-nourished.       Uncomfortable appearing  HENT:  Head: Normocephalic and atraumatic.  Right Ear: External ear normal.  Left Ear: External ear normal.  Mouth/Throat: Oropharynx is clear and moist.  Eyes: Conjunctivae and EOM are normal. Pupils are equal, round, and reactive to light.  Neck: Normal range of motion. Neck supple.  Cardiovascular: Normal rate, regular rhythm and normal heart sounds.   Pulmonary/Chest: Effort normal and breath sounds normal. No respiratory distress.  Abdominal: Soft. Bowel sounds are normal. There is no tenderness.  Musculoskeletal: Normal range of motion. She exhibits no edema.  Neurological: She is alert and oriented to person, place, and time. She has normal strength. No cranial nerve deficit or sensory deficit. She displays a negative Romberg sign. Coordination and gait normal. GCS eye subscore is 4. GCS verbal subscore is 5. GCS motor subscore is 6.       No  nystagmus, equal grip strength bilaterally, normal finger to nose, no pronator drift. 5/5 and equal LE strength bilaterally  Skin: Skin is warm and dry.  Psychiatric: She has a normal mood and affect.    ED Course  Procedures (including critical care time)  Pt with typical for her migraine. Pt with no neurofocal findings, normal examination. Will try to get pain under control  Pt received toradol, decadron, regalan IM, benadrly PO. She is a hard stick, IM medications were given, she stated they worked "last time."   No improvement with medications. Added phenergan.   No improvement again. Pt is tearful. Normal exam remains. CT head obtained and negative. Will try benadryl and haldol.  Ct Head Wo Contrast  07/15/2011  *RADIOLOGY  REPORT*  Clinical Data: Headache, nausea.  CT HEAD WITHOUT CONTRAST  Technique:  Contiguous axial images were obtained from the base of the skull through the vertex without contrast.  Comparison: MRI 12/12/2007  Findings: Mild cerebellar atrophy.  This is stable. No acute intracranial abnormality.  Specifically, no hemorrhage, hydrocephalus, mass lesion, acute infarction, or significant intracranial injury.  No acute calvarial abnormality. Visualized paranasal sinuses and mastoids clear.  Orbital soft tissues unremarkable.  IMPRESSION: No acute intracranial abnormality.  Original Report Authenticated By: Cyndie Chime, M.D.    CT head negative. Pain improved with haldol and benadryl. Pt is sleeping, no distress, easily arousable. Will dc home with follow up. Continues to be neurovascularly intact.  No diagnosis found.    MDM          Lottie Mussel, PA 07/15/11 1626

## 2011-07-15 NOTE — ED Notes (Signed)
H/a since yesterday

## 2011-07-18 ENCOUNTER — Other Ambulatory Visit: Payer: Self-pay | Admitting: *Deleted

## 2011-07-19 NOTE — Telephone Encounter (Signed)
Pt states she has not seen GI doctor.  Her appointment was cancelled and rescheduled for end of April.  She will get notes after that visit.

## 2011-07-19 NOTE — Telephone Encounter (Signed)
Hi Gayle,  Can you please let [name] know that she needs to come in for an appt. I saw her at the beginning of February and told her to come back in 2-3 weeks after she had met with her GI at [facility]. I also told her that she must have his note forwarded to [name] - neither of these have happened.  She had medications adjusted at last visit, and I want to follow-up on these things.  Please ask that she come for an appt.  Thank you.  Johnette Abraham, D.O.

## 2011-07-19 NOTE — Telephone Encounter (Signed)
Pt informed and is in pain at the present.  She does not want to wait until 4/1. Scheduled with Dr Gilford Rile tomorrow.

## 2011-07-20 ENCOUNTER — Ambulatory Visit (INDEPENDENT_AMBULATORY_CARE_PROVIDER_SITE_OTHER): Payer: BC Managed Care – PPO | Admitting: Internal Medicine

## 2011-07-20 ENCOUNTER — Encounter: Payer: Self-pay | Admitting: Internal Medicine

## 2011-07-20 VITALS — BP 147/98 | HR 88 | Temp 98.8°F | Ht 60.0 in | Wt 116.0 lb

## 2011-07-20 DIAGNOSIS — Z79899 Other long term (current) drug therapy: Secondary | ICD-10-CM

## 2011-07-20 DIAGNOSIS — R109 Unspecified abdominal pain: Secondary | ICD-10-CM

## 2011-07-20 DIAGNOSIS — Q453 Other congenital malformations of pancreas and pancreatic duct: Secondary | ICD-10-CM

## 2011-07-20 DIAGNOSIS — R52 Pain, unspecified: Secondary | ICD-10-CM

## 2011-07-20 DIAGNOSIS — E119 Type 2 diabetes mellitus without complications: Secondary | ICD-10-CM

## 2011-07-20 MED ORDER — PROMETHAZINE HCL 25 MG PO TABS: 25.0000 mg | ORAL_TABLET | Freq: Four times a day (QID) | ORAL | Status: DC | PRN

## 2011-07-20 MED ORDER — PANCRELIPASE (LIP-PROT-AMYL) 12000-38000 UNITS PO CPEP: 2.0000 | ORAL_CAPSULE | Freq: Three times a day (TID) | ORAL | Status: DC

## 2011-07-20 MED ORDER — HYDROCODONE-ACETAMINOPHEN 5-325 MG PO TABS: 1.0000 | ORAL_TABLET | Freq: Four times a day (QID) | ORAL | Status: DC | PRN

## 2011-07-20 NOTE — Telephone Encounter (Signed)
Pt's appointment is 3/27 with [place].  We will get the name of GI MD at that visit and send to you.

## 2011-07-20 NOTE — Assessment & Plan Note (Addendum)
Patient's chronic abdominal pain likely related to chronic pancreatitis, given the chronicity and lack of systemic symptoms this is unlikely to be infectious or autoimmune in origin. She does have a known history of pancreatic divisum, with frequent acute pancreatitis episodes. She also follows up with gastroenterology at [facility]. Note that during a recent hospitalization she had an EGD, colonoscopy, CT abdomen performed, All of these were essentially normal without indication of cause of her abdominal pain. Given stability, and her ability to tolerate oral intake, will discharge home after performing routine lab work including urine drug screen, lipase, CBC, and complete metabolic profile. For now continue pancreatic enzyme supplementation, Zofran as needed for nausea, and Vicodin for pain control. We'll have the patient come back in one month to see her primary care physician.

## 2011-07-20 NOTE — Telephone Encounter (Signed)
At her visit today, can you please request the contact info for her GI doctor, please? I think I need to talk with him directly. Thanks [employer].  Johnette Abraham, D.O.

## 2011-07-20 NOTE — Patient Instructions (Signed)
I will call you with the lab results if there are any abnormalities.

## 2011-07-20 NOTE — Progress Notes (Signed)
Patient ID: [name], female   DOB: January 10, 1956, 56 y.o.   MRN: [id number]  HPI:   Patient is a 56 y.o. female with a past medical history of DMII, HLD, HTN, chronic pancreatitis 2/2 pancreatic divisum, who presented to clinic today for pain medication refill for her chronic abdominal pain, note that patient was hospitalized at [facility] in January 2013 for evaluation of abdominal pain, determined to be secondary to chronic pancreatitis. EGD and colonoscopy were performed and were grossly normal. CT abdomen was also performed and was negative for acute abnormality to explain abdominal pain symptoms. She also sees a GI physician at [facility], Dr. Gery Pray. Today, the patient describes diffuse abdominal pain which has been getting worse over the past several days. Denies hematuria, dysuria, malodorous urine. Decreased oral intake secondary to pain, also confirms nausea but no diarrhea, vomiting, fevers, chills. Able to keep down liquids.   Review of Systems: Negative except per history of present illness  Physical Exam:  Nursing notes and vitals reviewed General:  alert, well-developed, and cooperative to examination.   Lungs:  normal respiratory effort, no accessory muscle use, normal breath sounds, no crackles, and no wheezes. Heart:  normal rate, regular rhythm, no murmurs, no gallop, and no rub.   Abdomen:  TTP diffusely, normal bowel sounds, no distention, mild guarding, no rebound tenderness, no hepatomegaly, and no splenomegaly.   Extremities:  No cyanosis, clubbing, edema Neurologic:  alert & oriented X3, nonfocal exam  Meds: Medications Prior to Admission  Medication Sig Dispense Refill  . calcium carbonate (TUMS - DOSED IN MG ELEMENTAL CALCIUM) 500 MG chewable tablet Chew 1 tablet by mouth 2 (two) times daily as needed. Indigestion      . lisinopril (PRINIVIL,ZESTRIL) 20 MG tablet Take 1 tablet (20 mg total) by mouth daily.  30 tablet  5  . metFORMIN (GLUCOPHAGE) 1000 MG tablet Take 1,000  mg by mouth 2 (two) times daily with a meal.      . pravastatin (PRAVACHOL) 40 MG tablet Take 1 tablet (40 mg total) by mouth daily.  30 tablet  6  . DISCONTD: lipase/protease/amylase (CREON) 12000 UNITS CPEP Take 2 capsules by mouth 3 (three) times daily before meals. Take 1 capsule before snacks.  810 capsule  4   No current facility-administered medications on file as of 07/20/2011.    Allergies: Shrimp flavor; Fioricet; and Sulfonamide derivatives Past Medical History  Diagnosis Date  . Hypertension   . HLD (hyperlipidemia) 2009  . Sickle cell trait   . Migraine headache   . Pulmonary embolus 2006    08/2004 -   Two areas of V/Q mismatch. Findings compatible  with high probability for pulmonary embolus.; pt was on coumadin for 1 year  . Pancreatic divisum     S/P ERCP with stenting 07/2010 at [facility], then stent removal (08/05/2010)  . Diabetes mellitus 2010    well controlled  . Seizure disorder   . History of migraine headaches   . Beta thalassemia   . CVA (cerebral infarction) 1993    Per report by patient she had a stroke and prolonged rehab course eventually resolving her right sided weakness, as per her hx obtained 08/2007  . Asthma     childhood asthma  . Cancer     of uterus  . GERD (gastroesophageal reflux disease)   . Stroke     1993, very small amount of left sided weakness  . Blood dyscrasia     thalasemia, sickle cell trait  Past Surgical History  Procedure Date  . Abdominal hysterectomy age 35 yo    2/2 endometriosis  . Pancreatic stent placement/removal     2011 and 2012  . Tumors     breast tumors removed  in 1980's   Family History  Problem Relation Age of Onset  . Prostate cancer Father   . Sickle cell anemia Brother   . Lung cancer Maternal Aunt   . Lung cancer Maternal Uncle   . Breast cancer Maternal Grandmother    History   Social History  . Marital Status: Married    Spouse Name: N/A    Number of Children: N/A  . Years of  Education: N/A   Occupational History  . Not on file.   Social History Main Topics  . Smoking status: Former Smoker -- 0.5 packs/day for 30 years    Quit date: 05/02/2000  . Smokeless tobacco: Not on file  . Alcohol Use: No  . Drug Use: No  . Sexually Active: Yes   Other Topics Concern  . Not on file   Social History Narrative   Works at [employer] as a Merchandiser, retail, on Health visitor about 9 hours/ day

## 2011-07-21 LAB — PRESCRIPTION ABUSE MONITORING 15P, URINE
Amphetamine/Meth: NEGATIVE ng/mL
Barbiturate Screen, Urine: NEGATIVE ng/mL
Cannabinoid Scrn, Ur: NEGATIVE ng/mL
Carisoprodol, Urine: NEGATIVE ng/mL
Cocaine Metabolites: NEGATIVE ng/mL
Creatinine, Urine: 33.02 mg/dL
Fentanyl, Ur: NEGATIVE ng/mL
Meperidine, Ur: NEGATIVE ng/mL
Propoxyphene: NEGATIVE ng/mL
Zolpidem, Urine: NEGATIVE ng/mL

## 2011-07-21 LAB — MICROALBUMIN / CREATININE URINE RATIO: Microalb, Ur: 0.5 mg/dL (ref 0.00–1.89)

## 2011-07-27 ENCOUNTER — Encounter: Payer: BC Managed Care – PPO | Admitting: Internal Medicine

## 2011-07-28 NOTE — ED Provider Notes (Signed)
Evaluation and management procedures were performed by the PA/NP under my supervision/collaboration.   Felisa Bonier, MD 07/28/11 1024

## 2011-08-30 ENCOUNTER — Encounter: Payer: Self-pay | Admitting: Internal Medicine

## 2011-08-30 ENCOUNTER — Telehealth: Payer: Self-pay | Admitting: *Deleted

## 2011-08-30 ENCOUNTER — Ambulatory Visit (INDEPENDENT_AMBULATORY_CARE_PROVIDER_SITE_OTHER): Payer: BC Managed Care – PPO | Admitting: Internal Medicine

## 2011-08-30 VITALS — BP 156/91 | HR 80 | Temp 97.3°F | Ht 60.0 in | Wt 118.9 lb

## 2011-08-30 DIAGNOSIS — Q453 Other congenital malformations of pancreas and pancreatic duct: Secondary | ICD-10-CM

## 2011-08-30 DIAGNOSIS — R109 Unspecified abdominal pain: Secondary | ICD-10-CM

## 2011-08-30 DIAGNOSIS — K861 Other chronic pancreatitis: Secondary | ICD-10-CM

## 2011-08-30 DIAGNOSIS — R1012 Left upper quadrant pain: Secondary | ICD-10-CM

## 2011-08-30 DIAGNOSIS — K859 Acute pancreatitis without necrosis or infection, unspecified: Secondary | ICD-10-CM

## 2011-08-30 DIAGNOSIS — E119 Type 2 diabetes mellitus without complications: Secondary | ICD-10-CM

## 2011-08-30 DIAGNOSIS — Z79891 Long term (current) use of opiate analgesic: Secondary | ICD-10-CM

## 2011-08-30 DIAGNOSIS — Z79899 Other long term (current) drug therapy: Secondary | ICD-10-CM

## 2011-08-30 LAB — COMPREHENSIVE METABOLIC PANEL
AST: 17 U/L (ref 0–37)
Albumin: 4.3 g/dL (ref 3.5–5.2)
Alkaline Phosphatase: 109 U/L (ref 39–117)
BUN: 11 mg/dL (ref 6–23)
Potassium: 3.6 mEq/L (ref 3.5–5.3)
Sodium: 142 mEq/L (ref 135–145)

## 2011-08-30 LAB — CBC
HCT: 36.9 % (ref 36.0–46.0)
Hemoglobin: 12.7 g/dL (ref 12.0–15.0)
RBC: 4.79 MIL/uL (ref 3.87–5.11)
WBC: 6.9 10*3/uL (ref 4.0–10.5)

## 2011-08-30 LAB — POCT URINALYSIS DIPSTICK
Bilirubin, UA: NEGATIVE
Glucose, UA: NEGATIVE
Ketones, UA: NEGATIVE
Leukocytes, UA: NEGATIVE
Spec Grav, UA: <=1.005

## 2011-08-30 LAB — AMYLASE: Amylase: 114 U/L — ABNORMAL HIGH (ref 0–105)

## 2011-08-30 MED ORDER — HYDROCODONE-ACETAMINOPHEN 5-325 MG PO TABS: 1.0000 | ORAL_TABLET | Freq: Four times a day (QID) | ORAL | Status: DC | PRN

## 2011-08-30 MED ORDER — PROMETHAZINE HCL 25 MG PO TABS: 25.0000 mg | ORAL_TABLET | Freq: Four times a day (QID) | ORAL | Status: DC | PRN

## 2011-08-30 NOTE — Telephone Encounter (Signed)
Pt called clinic and left message on answer machine asking for appointment today because of pain. No other details give.  She is at work and can not be reached and asked appointment time to be left on her phone.  Will see today.  Message left.

## 2011-08-30 NOTE — Progress Notes (Signed)
Patient ID: [name], female   DOB: Sep 28, 1955, 56 y.o.   MRN: [id number] HPI:    1. Epigastric abdominal pain. Hx of pancreatitis due to pancreas divisum. Reports an increase in epigastric pain up to 9/10 in intensity; with nausea and no vomiting, fever, chills, sweats, diarrhea or constipation. Patient tolerates bland diet.  Review of Systems: Negative except per history of present illness  Physical Exam:  Nursing notes and vitals reviewed General:  alert, well-developed, and cooperative to examination.   Lungs:  normal respiratory effort, no accessory muscle use, normal breath sounds, no crackles, and no wheezes. Heart:  normal rate, regular rhythm, no murmurs, no gallop, and no rub.   Abdomen:  soft, non-tender, normal bowel sounds, no distention, no guarding, no rebound tenderness, no hepatomegaly, and no splenomegaly.   Extremities:  No cyanosis, clubbing, edema Neurologic:  alert & oriented X3, nonfocal exam  Meds:  (Not in a hospital admission)  Allergies: Shrimp flavor; Fioricet; and Sulfonamide derivatives Past Medical History  Diagnosis Date  . Hypertension   . HLD (hyperlipidemia) 2009  . Sickle cell trait   . Migraine headache   . Pulmonary embolus 2006    08/2004 -   Two areas of V/Q mismatch. Findings compatible  with high probability for pulmonary embolus.; pt was on coumadin for 1 year  . Pancreatic divisum     S/P ERCP with stenting 07/2010 at [facility], then stent removal (08/05/2010)  . Diabetes mellitus 2010    well controlled  . Seizure disorder   . History of migraine headaches   . Beta thalassemia   . CVA (cerebral infarction) 1993    Per report by patient she had a stroke and prolonged rehab course eventually resolving her right sided weakness, as per her hx obtained 08/2007  . Asthma     childhood asthma  . Cancer     of uterus  . GERD (gastroesophageal reflux disease)   . Stroke     1993, very small amount of left sided weakness  . Blood  dyscrasia     thalasemia, sickle cell trait   Past Surgical History  Procedure Date  . Abdominal hysterectomy age 49 yo    2/2 endometriosis  . Pancreatic stent placement/removal     2011 and 2012  . Tumors     breast tumors removed  in 1980's   Family History  Problem Relation Age of Onset  . Prostate cancer Father   . Sickle cell anemia Brother   . Lung cancer Maternal Aunt   . Lung cancer Maternal Uncle   . Breast cancer Maternal Grandmother    History   Social History  . Marital Status: Married    Spouse Name: N/A    Number of Children: N/A  . Years of Education: N/A   Occupational History  . Not on file.   Social History Main Topics  . Smoking status: Former Smoker -- 0.5 packs/day for 30 years    Quit date: 05/02/2000  . Smokeless tobacco: Not on file  . Alcohol Use: No  . Drug Use: No  . Sexually Active: Yes   Other Topics Concern  . Not on file   Social History Narrative   Works at [employer] as a Merchandiser, retail, on Health visitor about 9 hours/ day    A/P: 1. Chronic pancreatitis secondary to pancreas divisum -LFT's, amylase, CBC today -f/u in 5 days with GI at [facility]. -bland diet -Phenergan PRN  2. Chronic use of opioid analgesic. -  Hx of inconsistent UDS as of 12/2010 (per Dr. Saralyn Pilar) -patient states that her last dose of Hydrocodone/acetaminophen was  Taken on 08/29/2011. -repeat UDS today ->if negative, this would be considered as a violation of opiate medication contract. -Rf Vicodin # 30 tabs with 1 RF  3. DM, type 2  - hold metformin due to prblem #1.  -will repeat Hgb A1C next OV

## 2011-08-30 NOTE — Patient Instructions (Signed)
Please, follow up with your GI specialist at [facility] on Monday as instructed. Please, call with any questions and go to ED if you start feeling worse.

## 2011-08-31 LAB — PRESCRIPTION ABUSE MONITORING 15P, URINE
Cannabinoid Scrn, Ur: NEGATIVE ng/mL
Cocaine Metabolites: NEGATIVE ng/mL
Creatinine, Urine: 86.87 mg/dL (ref >20.0)
Methadone Screen, Urine: NEGATIVE ng/mL
Opiate Screen, Urine: NEGATIVE ng/mL
Oxycodone Screen, Ur: POSITIVE ng/mL — ABNORMAL HIGH
Zolpidem, Urine: NEGATIVE ng/mL

## 2011-09-02 LAB — OPIATES/OPIOIDS (LC/MS-MS)
Codeine Urine: NEGATIVE NG/ML
Heroin (6-AM), UR: NEGATIVE NG/ML
Hydromorphone: NEGATIVE NG/ML
Morphine Urine: NEGATIVE NG/ML

## 2011-09-02 LAB — TRAMADOL, URINE: N-DESMETHYL-CIS-TRAMADOL: 3440 NG/ML — ABNORMAL HIGH

## 2011-09-14 ENCOUNTER — Telehealth: Payer: Self-pay | Admitting: *Deleted

## 2011-09-14 DIAGNOSIS — Q453 Other congenital malformations of pancreas and pancreatic duct: Secondary | ICD-10-CM

## 2011-09-14 NOTE — Telephone Encounter (Signed)
We do not do disability paperwork. Therefore, I will not be completing this for the patient. The most I can do is to do a SW referral so that Edson Snowball can guide her.   Johnette Abraham, D.O.

## 2011-09-14 NOTE — Telephone Encounter (Signed)
Needs a letter from PCP to start disabilty papers - need to include all Dx. Please call pt when letter is ready to be picked up -[phone]. Stanton Kidney Abeer Deskins RN 09/14/11 4:50PM

## 2011-09-15 NOTE — Telephone Encounter (Signed)
Will add referral with social worker to start disability.

## 2011-09-16 ENCOUNTER — Telehealth: Payer: Self-pay | Admitting: Licensed Clinical Social Worker

## 2011-09-16 NOTE — Telephone Encounter (Signed)
[name] was referred to CSW for assistance with applying for disability.  [name] is currently working 5 hrs a week and will soon be laid off for the summer.  [name] states she is requesting a letter from her PCP stating "that I still have diabetes and an issue with my pancreas".  CSW determined the letter is not needed for pt's current employer but pt would like the letter for his Social Security Disability application.  CSW explained the disability process, starting with pt calling the office to schedule appt to go over process and obtain necessary paperwork and forms.  CSW informed pt the Social Security office will obtain pt's medical records when necessary.  Pt aware and has number to call to schedule.  Pt denies add'l needs at this time.  Pt aware CSW is available to assist as needed.

## 2011-09-16 NOTE — Telephone Encounter (Signed)
Addended by: Youlanda Roys A on: 09/16/2011 09:21 AM   Modules accepted: Orders

## 2011-09-17 NOTE — Telephone Encounter (Signed)
I will send referral for [facility]. But again, I will not be involved in the disability process, and do not know that this patient needs disability.  Johnette Abraham, D.O.

## 2011-09-21 ENCOUNTER — Other Ambulatory Visit: Payer: Self-pay | Admitting: *Deleted

## 2011-09-21 DIAGNOSIS — R109 Unspecified abdominal pain: Secondary | ICD-10-CM

## 2011-09-21 DIAGNOSIS — K859 Acute pancreatitis without necrosis or infection, unspecified: Secondary | ICD-10-CM

## 2011-09-21 NOTE — Telephone Encounter (Signed)
Will pick up Rx 09/22/11 PM.

## 2011-09-22 MED ORDER — HYDROCODONE-ACETAMINOPHEN 5-325 MG PO TABS: 1.0000 | ORAL_TABLET | Freq: Four times a day (QID) | ORAL | Status: DC | PRN

## 2011-09-22 NOTE — Telephone Encounter (Signed)
Rx called into [facility] Quincie Haroon RN 09/22/11 5:05PM

## 2011-10-12 ENCOUNTER — Emergency Department (HOSPITAL_COMMUNITY): Payer: BC Managed Care – PPO

## 2011-10-12 ENCOUNTER — Emergency Department (HOSPITAL_COMMUNITY)
Admission: EM | Admit: 2011-10-12 | Discharge: 2011-10-13 | Disposition: A | Discharge status: Home / Self Care | Payer: BC Managed Care – PPO | Attending: Emergency Medicine | Admitting: Emergency Medicine

## 2011-10-12 ENCOUNTER — Encounter (HOSPITAL_COMMUNITY): Payer: Self-pay | Admitting: Emergency Medicine

## 2011-10-12 DIAGNOSIS — E119 Type 2 diabetes mellitus without complications: Secondary | ICD-10-CM | POA: Insufficient documentation

## 2011-10-12 DIAGNOSIS — K861 Other chronic pancreatitis: Secondary | ICD-10-CM | POA: Insufficient documentation

## 2011-10-12 DIAGNOSIS — R109 Unspecified abdominal pain: Secondary | ICD-10-CM | POA: Insufficient documentation

## 2011-10-12 DIAGNOSIS — I10 Essential (primary) hypertension: Secondary | ICD-10-CM | POA: Insufficient documentation

## 2011-10-12 DIAGNOSIS — R10819 Abdominal tenderness, unspecified site: Secondary | ICD-10-CM | POA: Insufficient documentation

## 2011-10-12 DIAGNOSIS — Z8673 Personal history of transient ischemic attack (TIA), and cerebral infarction without residual deficits: Secondary | ICD-10-CM | POA: Insufficient documentation

## 2011-10-12 DIAGNOSIS — D573 Sickle-cell trait: Secondary | ICD-10-CM | POA: Insufficient documentation

## 2011-10-12 DIAGNOSIS — Z79899 Other long term (current) drug therapy: Secondary | ICD-10-CM | POA: Insufficient documentation

## 2011-10-12 LAB — URINALYSIS, ROUTINE W REFLEX MICROSCOPIC
Bilirubin Urine: NEGATIVE
Hgb urine dipstick: NEGATIVE
Ketones, ur: NEGATIVE mg/dL
Urobilinogen, UA: 0.2 mg/dL (ref 0.0–1.0)

## 2011-10-12 LAB — CBC
HCT: 37.9 % (ref 36.0–46.0)
MCHC: 35.1 g/dL (ref 30.0–36.0)
MCV: 76.9 fL — ABNORMAL LOW (ref 78.0–100.0)
RDW: 14 % (ref 11.5–15.5)

## 2011-10-12 LAB — COMPREHENSIVE METABOLIC PANEL
AST: 19 U/L (ref 0–37)
Albumin: 4.6 g/dL (ref 3.5–5.2)
Calcium: 10.2 mg/dL (ref 8.4–10.5)
Creatinine, Ser: 0.64 mg/dL (ref 0.50–1.10)
GFR calc non Af Amer: >90 mL/min (ref >90)

## 2011-10-12 LAB — DIFFERENTIAL
Basophils Absolute: 0 10*3/uL (ref 0.0–0.1)
Basophils Relative: 0 % (ref 0–1)
Eosinophils Relative: 1 % (ref 0–5)
Monocytes Absolute: 0.4 10*3/uL (ref 0.1–1.0)

## 2011-10-12 MED ORDER — FENTANYL CITRATE 0.05 MG/ML IJ SOLN
100.0000 ug | Freq: Once | INTRAMUSCULAR | Status: AC
  Administered 2011-10-12: 100 ug via INTRAVENOUS
  Filled 2011-10-12: qty 2

## 2011-10-12 MED ORDER — FENTANYL CITRATE 0.05 MG/ML IJ SOLN
50.0000 ug | Freq: Once | INTRAMUSCULAR | Status: AC
  Administered 2011-10-12: 50 ug via INTRAVENOUS
  Filled 2011-10-12: qty 2

## 2011-10-12 MED ORDER — HYDROMORPHONE HCL PF 2 MG/ML IJ SOLN
2.0000 mg | Freq: Once | INTRAMUSCULAR | Status: AC
  Administered 2011-10-12: 2 mg via INTRAVENOUS
  Filled 2011-10-12: qty 1

## 2011-10-12 MED ORDER — SODIUM CHLORIDE 0.9 % IV BOLUS (SEPSIS)
1000.0000 mL | Freq: Once | INTRAVENOUS | Status: AC
  Administered 2011-10-12: 1000 mL via INTRAVENOUS

## 2011-10-12 MED ORDER — ONDANSETRON HCL 4 MG/2ML IJ SOLN
4.0000 mg | Freq: Once | INTRAMUSCULAR | Status: AC
  Administered 2011-10-12: 4 mg via INTRAVENOUS
  Filled 2011-10-12: qty 2

## 2011-10-12 MED ORDER — KETOROLAC TROMETHAMINE 30 MG/ML IJ SOLN
30.0000 mg | Freq: Once | INTRAMUSCULAR | Status: AC
  Administered 2011-10-13: 30 mg via INTRAVENOUS
  Filled 2011-10-12: qty 1

## 2011-10-12 NOTE — ED Notes (Signed)
IV Team paged for iv access 

## 2011-10-12 NOTE — ED Notes (Signed)
Pt c/o upper left sided abd pain that feels like pancreatitis with N/V/D; pt sts hx of same

## 2011-10-12 NOTE — ED Notes (Signed)
Pt refused for RN to establish iv access, states "you need to call the specialist tech so they can use the ultrasound machine to get my veins". Plan of care is updated with verbal understanding and will continue to monitor pt.

## 2011-10-12 NOTE — ED Provider Notes (Signed)
History     CSN: [id number]  Arrival date & time 10/12/11  1429   First MD Initiated Contact with Patient 10/12/11 1914      Chief Complaint  Patient presents with  . Abdominal Pain  . Emesis    (Consider location/radiation/quality/duration/timing/severity/associated sxs/prior treatment) HPI  GI physician at [facility], Dr. Gery Pray  Patient to the ED complaining of her pancreatic pain'. 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 She has been extensively worked up with an EGD, colonoscopy CT of abdomen which were all normal earlier this year. The patient denies this acute episode being different than her other episodes. She has been hospitalized for this problem on more than one occasion. After reviewing other provider notes, their may be some opiate abuse gong on as well. The patients primary care provider in [place] is Dr. Saralyn Pilar.   The patient states that this episode started on Sunday and has been continually getting worse.She admits to have severe nausea with having had episodes of diarrhea and vomiting associated.            Past Medical History  Diagnosis Date  . Hypertension   . HLD (hyperlipidemia) 2009  . Sickle cell trait   . Migraine headache   . Pulmonary embolus 2006    08/2004 -   Two areas of V/Q mismatch. Findings compatible  with high probability for pulmonary embolus.; pt was on coumadin for 1 year  . Pancreatic divisum     S/P ERCP with stenting 07/2010 at [facility], then stent removal (08/05/2010)  . Diabetes mellitus 2010    well controlled  . Seizure disorder   . History of migraine headaches   . Beta thalassemia   . CVA (cerebral infarction) 1993    Per report by patient she had a stroke and prolonged rehab course eventually resolving her right sided weakness, as per her hx obtained 08/2007  . Asthma     childhood asthma  . Cancer     of uterus  . GERD (gastroesophageal reflux disease)   . Stroke       1993, very small amount of left sided weakness  . Blood dyscrasia     thalasemia, sickle cell trait    Past Surgical History  Procedure Date  . Abdominal hysterectomy age 23 yo    2/2 endometriosis  . Pancreatic stent placement/removal     20 11 and 2012  . Tumors     breast tumors removed  in 1980's    Family History  Problem Relation Age of Onset  . Prostate cancer Father   . Sickle cell anemia Brother   . Lung cancer Maternal Aunt   . Lung cancer Maternal Uncle   . Breast cancer Maternal Grandmother     History  Substance Use Topics  . Smoking status: Former Smoker -- 0.5 packs/day for 30 years    Quit date: 05/02/2000  . Smokeless tobacco: Not on file  . Alcohol Use: No    OB History    Grav Para Term Preterm Abortions TAB SAB Ect Mult Living                  Review of Systems   HEENT: denies blurry vision or change in hearing PULMONARY: Denies difficulty breathing and SOB CARDIAC: denies chest pain or heart palpitations MUSCULOSKELETAL:  denies being unable to ambulate ABDOMEN: Positive for abdominal pain, N/V/D GU: denies loss of bowel or urinary control NEURO: denies numbness and tingling in  extremities SKIN: no new rashes PSYCH: patient behavior is normal NECK: Not complaining of neck pain     Allergies  Shrimp flavor; Fioricet; and Sulfonamide derivatives  Home Medications   Current Outpatient Rx  Name Route Sig Dispense Refill  . CALCIUM CARBONATE ANTACID 500 MG PO CHEW Oral Chew 1 tablet by mouth 2 (two) times daily as needed. Indigestion    . HYDROCODONE-ACETAMINOPHEN 5-325 MG PO TABS Oral Take 1 tablet by mouth every 6 (six) hours as needed for pain. 30 tablet 1    Called in per Debbi.  Marland Kitchen PANCRELIPASE (LIP-PROT-AMYL) 12000 UNITS PO CPEP Oral Take 1-2 capsules by mouth 4 (four) times daily. 2 capsules with meals (breakfast, lunch and dinner) 1 capsule with snack    . LISINOPRIL 20 MG PO TABS Oral Take 1 tablet (20 mg total) by mouth  daily. 30 tablet 5  . IMODIUM PO Oral Take 1 tablet by mouth as needed. After each loose stool    . METFORMIN HCL 1000 MG PO TABS Oral Take 1,000 mg by mouth 2 (two) times daily with a meal.    . PROMETHAZINE HCL 25 MG PO TABS Oral Take 25 mg by mouth every 6 (six) hours as needed. For nausea    . HYDROCODONE-ACETAMINOPHEN 5-500 MG PO TABS Oral Take 1 tablet by mouth every 6 (six) hours as needed for pain. 8 tablet 0  . PROMETHAZINE HCL 25 MG PO TABS Oral Take 1 tablet (25 mg total) by mouth every 6 (six) hours as needed for nausea. 8 tablet 0    BP 183/95  Pulse 76  Temp 98.5 F (36.9 C) (Oral)  Resp 16  SpO2 98%  Physical Exam  Nursing note and vitals reviewed. Constitutional: She appears well-developed and well-nourished. No distress.  HENT:  Head: Normocephalic and atraumatic.  Eyes: Pupils are equal, round, and reactive to light.  Neck: Normal range of motion. Neck supple.  Cardiovascular: Normal rate and regular rhythm.   Pulmonary/Chest: Effort normal.  Abdominal: Soft. There is tenderness in the epigastric area and left lower quadrant. There is rebound. There is no rigidity, no CVA tenderness, no tenderness at McBurney's point and negative Murphy's sign.    Neurological: She is alert.  Skin: Skin is warm and dry.    ED Course  Procedures (including critical care time)  Labs Reviewed  CBC - Abnormal; Notable for the following:    MCV 76.9 (*)     All other components within normal limits  DIFFERENTIAL - Abnormal; Notable for the following:    Lymphocytes Relative 48 (*)     All other components within normal limits  COMPREHENSIVE METABOLIC PANEL - Abnormal; Notable for the following:    Glucose, Bld 137 (*)     Total Protein 8.7 (*)     All other components within normal limits  URINALYSIS, ROUTINE W REFLEX MICROSCOPIC - Abnormal; Notable for the following:    Glucose, UA 250 (*)     All other components within normal limits  LIPASE, BLOOD   US Abdomen  Complete  10/12/2011  *RADIOLOGY REPORT*  Clinical Data:  Mid epigastric and left upper quadrant pain  COMPLETE ABDOMINAL ULTRASOUND  Comparison:  03/18/2011 CT  Findings:  Gallbladder:  No gallstones, gallbladder wall thickening, or pericholecystic fluid.  Common bile duct:  Measures 2 mm proximally.  The distal duct is partially obscured.  Liver:  No focal lesion identified.  Within normal limits in parenchymal echogenicity.  IVC:  Appears normal.  Pancreas:  No focal abnormality seen. Portions of the body and tail are obscured by overlying bowel gas artifact.  Spleen:  Measures 5 cm oblique.  No focal abnormality.  Right Kidney:  Measures 7.4 cm.  No hydronephrosis or focal abnormality.  Left Kidney:  Measures 8.8 cm.  No hydronephrosis or focal abnormality.  Abdominal aorta:  No aneurysm identified.  IMPRESSION: No acute abnormality identified.  Original Report Authenticated By: Waneta Martins, M.D.     1. Chronic pancreatitis       MDM  Abdomenal US ordered. Patient given 2mg  IV Dilaudid, 30mg  IV Toradol, and 100 mcg of Fentanyl through out her stay in the ED. Patient is a chronic pancreatitis patient. Her labs in the ED are stable with no acute changes. Lipase is 35. Patient able to tolerate oral fluids in ED through out her stay with no episodes of vomiting or diarrhea. Pt received 2 L of fluids as well.  Patient also had an abdominal ultrasound done which should no acute abnormal changes. Patients pain is improved from when she came to the ED and she has been advised to call Dr. Gery Pray at [facility] for better pain control. No Emergent measures need to be taken at this time.    Pt has been advised of the symptoms that warrant their return to the ED. Patient has voiced understanding and has agreed to follow-up with the PCP or specialist.        Dorthula Matas, PA 10/13/11 0010

## 2011-10-12 NOTE — ED Notes (Signed)
Pt returned from testing and iv team at bedside for iv access.

## 2011-10-13 ENCOUNTER — Encounter: Payer: Self-pay | Admitting: Internal Medicine

## 2011-10-13 DIAGNOSIS — R109 Unspecified abdominal pain: Secondary | ICD-10-CM | POA: Insufficient documentation

## 2011-10-13 DIAGNOSIS — G8929 Other chronic pain: Secondary | ICD-10-CM

## 2011-10-13 MED ORDER — HYDROCODONE-ACETAMINOPHEN 5-500 MG PO TABS: 1.0000 | ORAL_TABLET | Freq: Four times a day (QID) | ORAL | Status: DC | PRN

## 2011-10-13 MED ORDER — PROMETHAZINE HCL 25 MG PO TABS: 25.0000 mg | ORAL_TABLET | Freq: Four times a day (QID) | ORAL | Status: DC | PRN

## 2011-10-13 NOTE — ED Notes (Signed)
Pt denies any questions upon discharge. 

## 2011-10-14 NOTE — ED Provider Notes (Signed)
Medical screening examination/treatment/procedure(s) were performed by non-physician practitioner and as supervising physician I was immediately available for consultation/collaboration.    Hisao Doo L Fain Francis, MD 10/14/11 1554 

## 2011-10-18 ENCOUNTER — Other Ambulatory Visit: Payer: Self-pay | Admitting: *Deleted

## 2011-10-18 DIAGNOSIS — I10 Essential (primary) hypertension: Secondary | ICD-10-CM

## 2011-10-18 MED ORDER — LISINOPRIL 20 MG PO TABS: 20.0000 mg | ORAL_TABLET | Freq: Every day | ORAL | Status: DC

## 2011-10-20 ENCOUNTER — Encounter: Payer: Self-pay | Admitting: Internal Medicine

## 2011-10-20 ENCOUNTER — Ambulatory Visit (INDEPENDENT_AMBULATORY_CARE_PROVIDER_SITE_OTHER): Payer: BC Managed Care – PPO | Admitting: Internal Medicine

## 2011-10-20 VITALS — BP 181/99 | HR 94 | Temp 97.9°F | Ht 60.0 in | Wt 119.5 lb

## 2011-10-20 DIAGNOSIS — G8929 Other chronic pain: Secondary | ICD-10-CM

## 2011-10-20 DIAGNOSIS — I10 Essential (primary) hypertension: Secondary | ICD-10-CM

## 2011-10-20 DIAGNOSIS — Z79899 Other long term (current) drug therapy: Secondary | ICD-10-CM

## 2011-10-20 DIAGNOSIS — Z0000 Encounter for general adult medical examination without abnormal findings: Secondary | ICD-10-CM

## 2011-10-20 DIAGNOSIS — R109 Unspecified abdominal pain: Secondary | ICD-10-CM

## 2011-10-20 DIAGNOSIS — E119 Type 2 diabetes mellitus without complications: Secondary | ICD-10-CM

## 2011-10-20 LAB — POCT GLYCOSYLATED HEMOGLOBIN (HGB A1C): Hemoglobin A1C: 8.8

## 2011-10-20 LAB — GLUCOSE, CAPILLARY: Glucose-Capillary: 116 mg/dL — ABNORMAL HIGH (ref 70–99)

## 2011-10-20 MED ORDER — LISINOPRIL-HYDROCHLOROTHIAZIDE 20-12.5 MG PO TABS: 1.0000 | ORAL_TABLET | Freq: Every day | ORAL | Status: DC

## 2011-10-20 MED ORDER — METOCLOPRAMIDE HCL 5 MG PO TABS: 5.0000 mg | ORAL_TABLET | Freq: Three times a day (TID) | ORAL | Status: DC

## 2011-10-20 MED ORDER — LISINOPRIL 40 MG PO TABS: 40.0000 mg | ORAL_TABLET | Freq: Every day | ORAL | Status: DC

## 2011-10-20 NOTE — Assessment & Plan Note (Signed)
Assessment: The patient has chronic abdominal pain of unclear etiology. She signed a pain contract with me in August 2012, at which time she was explicitly explained that she would not be allowed to receive narcotic pain medications as an outpatient from any other providers outside of [facility]. There have been multiple infractions of the pain contract since its initiation. The patient is insistent that she did not realize she violated her pain contract. Because of the sensitivity of the matter, I reviewed all infractions with Dr. Rogelia Boga. She and I agree that there has been multiple infractions of the pain contract, and red flag behavior.  Infractions as below: August 2012 - UDS was negative for opiates despite the patient stating that she took Vicodin on the day of evaluation - this was thought likely because total opiates were not ordered, therefore the patient continued to receive her context from her clinic.  November 2012 - patient received narcotics from outside facility, and did not inform [facility].  April 2013 - patient's UDS indicated no hydrocodone or hydromorphone, despite the patient stating that she took this medication one day prior to evaluation.   Plan:      The patient will no longer be prescribed narcotics from [facility] - this was explained to the patient by both Dr. Rogelia Boga and myself.   FYI he was placed indicating controlled medication caution.

## 2011-10-20 NOTE — Assessment & Plan Note (Addendum)
Pertinent Data: BP Readings from Last 3 Encounters:  10/20/11 181/99  10/13/11 182/81  08/30/11 156/91    Basic Metabolic Panel:    Component Value Date/Time   NA 140 10/12/2011 1450   K 3.6 10/12/2011 1450   CL 102 10/12/2011 1450   CO2 26 10/12/2011 1450   BUN 8 10/12/2011 1450   CREATININE 0.64 10/12/2011 1450   CREATININE 0.68 08/30/2011 1530   GLUCOSE 137* 10/12/2011 1450   CALCIUM 10.2 10/12/2011 1450    Assessment: Disease Control: not controlled  Progress toward goals: unchanged  Barriers to meeting goals: no barriers identified    Patient is compliant all of the time with prescribed medications.   Plan:  Has had persistent hypertension on multiple occasions.  Given the severity of her high blood pressure, do not feel like she will be adequately controlled on a single agent.   Will discontinue lisinopril.   Start lisinopril-hydrochlorothiazide 20-12.5 mg.   Will followup within one to 2 months, at which time can assess if escalation of therapy is warranted.   Will recheck BMET at next visit.

## 2011-10-20 NOTE — Progress Notes (Addendum)
Subjective:    Patient ID: [name] female   DOB: 17-Sep-1955 56 y.o.   MRN: [id number]  HPI: [name] is a 56 y.o. with a PMHx of DMII (A1c is 8.8), HTN, chronic abdominal pain who presented to clinic today for the following:  1) DMII (last A1c 8.8 today) - Patient checking blood sugars 0 times daily. Currently taking metformin 1000 mg BID on most day, unless her stomach is acting up. Patient misses doses 6 x per month on average. denies polyuria, polydipsia, vomiting.  does not request refills today.  2) HTN - Patient does not check blood pressure regularly at home. Currently taking Lisinopril 20 mg daily. Patient misses doses 0 x per week on average. denies headaches, dizziness, lightheadedness, chest pain, shortness of breath.  does request refills today - needs a 90 day supply.   3) Chronic abdominal pain - patient indicates that she continues to have intermittent episodes of sharp, left upper quadrant abdominal pain that radiates through to her back. This is occurring very intermittently, can happen a few times a month where she has severe pain for several days at a time. Can alternatively have weeks without abdominal pain. At baseline, when not having an acute flare, she has 0/10 pain. The pain is not specifically precipitated by any types of foods, there seems no association with milk, cheeses, bread, cereal. Associated with the pain, the patient experiences nausea, and diarrhea with both watery and loose stools.  The patient was recently hospitalized at [facility] for her pain, during this hospitalization, and EUS and EGD were performed. Although, there was evidence of the pancreatic divisum, there was no acute evidence of pancreatic cyst, calcifications, or any indication of chronic pancreatitis. As well, the esophagus, stomach, and duodenum had normal mucosa. The patient reports that Dr. Rochel Brome, her primary gastroenterologist at [facility] recommended for her to  followup with him within the next month, at which time reevaluation for need of pancreatic stenting would be made. However, I cannot find evidence of this specific conversation in the EUS, EGD reports that were sent to me.  At this time, the patient is not experiencing severe pain. She denies hematuria, dysuria, malodorous urine. Oral intake is stable at this time. No diarrhea, vomiting, fevers, chills. Last Vicodin was approximately 3 days ago.  4) Pain Contract - the patient has a pain contract that was initiated in August 2012. Within August itself, there was concern for red behavior as UDS at that time was negative for opiates despite the patient saying that she was on opioid pain medication the day of evaluation. Subsequently, in November 2012, the patient was prescribed opiates from an outside facility, without informing [facility]. Lastly, in April 2013, the patient had a UDS performed which was negative for hydrocodone or its metabolites despite the patient stating that she took Vicodin the day prior to evaluation. The patient is confronted with the data available. The patient states that she was previously in the hospital in November and did not understand that she was supposed to notify clinic of narcotics received at hospital discharge. The patient is adamant that in April 2013, she was taking her hydrocodone despite UDS being negative.    Review of Systems: Per HPI.  Current Outpatient Medications: Medication Sig  . calcium carbonate (TUMS - DOSED IN MG ELEMENTAL CALCIUM) 500 MG chewable tablet Chew 1 tablet by mouth 2 (two) times daily as needed. Indigestion  . HYDROcodone-acetaminophen (VICODIN) 5-500 MG per tablet Take 1 tablet  by mouth every 6 (six) hours as needed for pain.  Marland Kitchen lipase/protease/amylase (CREON-10/PANCREASE) 12000 UNITS CPEP Take 1-2 capsules by mouth 4 (four) times daily. 2 capsules with meals (breakfast, lunch and dinner) 1 capsule with snack  . lisinopril (PRINIVIL,ZESTRIL)  20 MG tablet Take 1 tablet (20 mg total) by mouth daily.  . Loperamide HCl (IMODIUM PO) Take 1 tablet by mouth as needed. After each loose stool  . metFORMIN (GLUCOPHAGE) 1000 MG tablet Take 1,000 mg by mouth 2 (two) times daily with a meal.  . promethazine (PHENERGAN) 25 MG tablet Take 25 mg by mouth every 6 (six) hours as needed. For nausea    Allergies: Allergies  Allergen Reactions  . Shrimp Flavor Anaphylaxis    unknown  . Fioricet (Butalbital-Apap-Caffeine) Other (See Comments)    Put into a coma state  . Sulfonamide Derivatives Rash    unknown    Past Medical History  Diagnosis Date  . Hypertension   . HLD (hyperlipidemia) 2009  . Sickle cell trait   . Migraine headache   . Pulmonary embolus 2006    08/2004 -   Two areas of V/Q mismatch. Findings compatible  with high probability for pulmonary embolus.; pt was on coumadin for 1 year  . Pancreatic divisum     S/P ERCP with stenting 07/2010 at [facility], then stent removal (08/05/2010)  . Diabetes mellitus 2010    well controlled  . Seizure disorder   . Beta thalassemia   . CVA (cerebral infarction) 1993    Per report by patient she had a stroke and prolonged rehab course eventually resolving her right sided weakness, as per her hx obtained 08/2007  . Asthma     childhood asthma  . Cancer     of uterus  . GERD (gastroesophageal reflux disease)   . Stroke     1993, very small amount of left sided weakness  . Blood dyscrasia     thalasemia, sickle cell trait  . Chronic abdominal pain     Unclear etiology, thought to be due to chronic pancreatitis previously in setting of her pancreatic divisum - however, EUS and EGD performed at [facility]  (10/05/2011) showing normal esophageall, gastric, duodenal mucosa. EUS showing no pancreatic masses, cysts, or changes of chronic pancreatitis. Biliary system nondilated and had no endosonographic abnormalities.    Past Surgical History  Procedure Date  . Abdominal hysterectomy age  56 yo    2/2 endometriosis  . Pancreatic stent placement/removal     2011 and 2012  . Tumors     breast tumors removed  in 1980's     Objective:    Physical Exam: Filed Vitals:   10/20/11 1530  BP: 181/99  Pulse: 94  Temp: 97.9 F (36.6 C)     General: Vital signs reviewed and noted. Well-developed, well-nourished, in no acute distress; alert, appropriate and cooperative throughout examination.  Head: Normocephalic, atraumatic.  Lungs:  Normal respiratory effort. Clear to auscultation BL without crackles or wheezes.  Heart: RRR. S1 and S2 normal without gallop, rubs. (+) murmur.  Abdomen:  BS normoactive. Soft, Nondistended.  No masses or organomegaly. Left upper quadrant abdominal pain that is distractible. No rebound tenderness.  Extremities: No pretibial edema.    Assessment/ Plan:   Case and plan of care discussed with Dr. Blanch Media.

## 2011-10-20 NOTE — Patient Instructions (Addendum)
   Please follow-up at the clinic in 1-2 months, at which time we will reevaluate blood pressure, diabetes - OR, please follow-up in the clinic sooner if needed.  There have been changes in your medications:  START Lisinopril-HCTZ for your blood pressure  STOP Lisinopril   START the Reglan - three times a day before meals   If you have been started on new medication(s), and you develop symptoms concerning for allergic reaction, including, but not limited to, throat closing, tongue swelling, rash, please stop the medication immediately and call the clinic at [phone], and go to the ER.  If you are diabetic, please bring your meter to your next visit.  If symptoms worsen, or new symptoms arise, please call the clinic or go to the ER.  Please bring all of your medications in a bag to your next visit.

## 2011-10-20 NOTE — Assessment & Plan Note (Signed)
Health Maintenance  Topic Date Due  . Foot Exam  10/08/2011  . Hemoglobin A1c  10/20/2011  . Influenza Vaccine  01/31/2012  . Mammogram  03/29/2012  . Lipid Panel  06/06/2012  . Ophthalmology Exam  06/14/2012  . Pap Smear  03/11/2013  . Colonoscopy  02/26/2014  . Tetanus/tdap  04/06/2021  . Pneumococcal Polysaccharide Vaccine (#2) 02/16/2009    Assessment:  Up to date on most routine medical care.  Needs diabetic foot exam.  Plan:  Foot exam performed today.

## 2011-10-20 NOTE — Assessment & Plan Note (Addendum)
Assessment: Disease Control: not controlled  Progress toward goals: unchanged    Patient's chronic abdominal pain is still unexplained at this time - possible gastroparesis secondary to her diabetes versus possible non-ulcerative dyspepsia. Other differential diagnoses include pancreatic insufficiency (less likely given that she is on chronic Creon) versus medication side effect (for metformin or her narcotics).  EGD performed on 10/05/2011 at [facility] showed normal esophageal, gastric, duodenal mucosa.   EUS performed on 10/05/2011 at [facility] showed no pancreatic masses, cysts, or changes of chronic pancreatitis. Biliary system nondilated and had no endosonographic abnormalities.  Currently, there is no evidence of acute infectious etiology as the patient remains without fevers, chills, vomiting, diarrhea.   Other more rare diagnoses such as adrenal insufficiency or posterior are also considered, however felt to be much less likely given that she has no other lab or vital sign abnormalities to suggest this.   She is to followup with Dr. Corrie Dandy at [facility] within the next month.   Plan:      Start Reglan, which presumably may be able to treat both gastroparesis and/ or non-ulcerative dyspepsia.  Will not prescribe additional narcotic pain medications from our clinic. Please see chronic pain problem for additional details regarding pain contract violation.

## 2011-10-20 NOTE — Assessment & Plan Note (Signed)
Pertinent Labs: Lab Results  Component Value Date   HGBA1C 8.8 10/20/2011   HGBA1C 6.7* 03/17/2011   CREATININE 0.64 10/12/2011   CREATININE 0.68 08/30/2011   MICROALBUR 0.50 07/20/2011   MICRALBCREAT 14.6 07/20/2011   CHOL 238* 06/07/2011   HDL 96 06/07/2011   TRIG 43 05/07/1094    Assessment: Disease Control: not controlled  Progress toward goals: deteriorated  Barriers to meeting goals: has not been taking her metformin when she is not feeling well in terms of diarrhea or abdominal pain.      Patient is compliant most of the time with prescribed medications.   Plan: Glucometer log was not reviewed today, as pt did not have glucometer available for review.   continue current medications  reminded to bring blood glucose meter & log to each visit, discussed foot care, discussed the need for weight loss and discussed diet  Advised to check blood sugars when feeling poorly and call if < 70 mg/dL and don't take metformin.

## 2011-11-16 ENCOUNTER — Ambulatory Visit (INDEPENDENT_AMBULATORY_CARE_PROVIDER_SITE_OTHER): Payer: BC Managed Care – PPO | Admitting: Internal Medicine

## 2011-11-16 ENCOUNTER — Encounter: Payer: Self-pay | Admitting: Internal Medicine

## 2011-11-16 VITALS — BP 151/94 | HR 78 | Temp 97.7°F | Ht 60.0 in | Wt 116.8 lb

## 2011-11-16 DIAGNOSIS — D509 Iron deficiency anemia, unspecified: Secondary | ICD-10-CM | POA: Insufficient documentation

## 2011-11-16 DIAGNOSIS — G40909 Epilepsy, unspecified, not intractable, without status epilepticus: Secondary | ICD-10-CM

## 2011-11-16 DIAGNOSIS — Z8669 Personal history of other diseases of the nervous system and sense organs: Secondary | ICD-10-CM | POA: Insufficient documentation

## 2011-11-16 DIAGNOSIS — K625 Hemorrhage of anus and rectum: Secondary | ICD-10-CM

## 2011-11-16 DIAGNOSIS — R110 Nausea: Secondary | ICD-10-CM

## 2011-11-16 DIAGNOSIS — Z8709 Personal history of other diseases of the respiratory system: Secondary | ICD-10-CM | POA: Insufficient documentation

## 2011-11-16 DIAGNOSIS — Z8542 Personal history of malignant neoplasm of other parts of uterus: Secondary | ICD-10-CM | POA: Insufficient documentation

## 2011-11-16 DIAGNOSIS — Z8673 Personal history of transient ischemic attack (TIA), and cerebral infarction without residual deficits: Secondary | ICD-10-CM

## 2011-11-16 DIAGNOSIS — Z8719 Personal history of other diseases of the digestive system: Secondary | ICD-10-CM | POA: Insufficient documentation

## 2011-11-16 DIAGNOSIS — Z87898 Personal history of other specified conditions: Secondary | ICD-10-CM

## 2011-11-16 DIAGNOSIS — R197 Diarrhea, unspecified: Secondary | ICD-10-CM

## 2011-11-16 DIAGNOSIS — R079 Chest pain, unspecified: Secondary | ICD-10-CM | POA: Insufficient documentation

## 2011-11-16 DIAGNOSIS — I10 Essential (primary) hypertension: Secondary | ICD-10-CM

## 2011-11-16 DIAGNOSIS — D573 Sickle-cell trait: Secondary | ICD-10-CM | POA: Insufficient documentation

## 2011-11-16 DIAGNOSIS — E119 Type 2 diabetes mellitus without complications: Secondary | ICD-10-CM

## 2011-11-16 DIAGNOSIS — D561 Beta thalassemia: Secondary | ICD-10-CM

## 2011-11-16 DIAGNOSIS — R0602 Shortness of breath: Secondary | ICD-10-CM

## 2011-11-16 DIAGNOSIS — Z86711 Personal history of pulmonary embolism: Secondary | ICD-10-CM

## 2011-11-16 DIAGNOSIS — E785 Hyperlipidemia, unspecified: Secondary | ICD-10-CM

## 2011-11-16 DIAGNOSIS — R109 Unspecified abdominal pain: Secondary | ICD-10-CM

## 2011-11-16 DIAGNOSIS — G8929 Other chronic pain: Secondary | ICD-10-CM

## 2011-11-16 HISTORY — DX: Iron deficiency anemia, unspecified: D50.9

## 2011-11-16 LAB — CBC WITH DIFFERENTIAL/PLATELET
Basophils Absolute: 0 10*3/uL (ref 0.0–0.1)
Basophils Relative: 0 % (ref 0–1)
Eosinophils Absolute: 0.1 10*3/uL (ref 0.0–0.7)
Eosinophils Relative: 1 % (ref 0–5)
MCH: 26.6 pg (ref 26.0–34.0)
MCHC: 35.2 g/dL (ref 30.0–36.0)
MCV: 75.7 fL — ABNORMAL LOW (ref 78.0–100.0)
Neutrophils Relative %: 43 % (ref 43–77)
Platelets: 399 10*3/uL (ref 150–400)
RDW: 14.4 % (ref 11.5–15.5)

## 2011-11-16 LAB — GLUCOSE, CAPILLARY: Glucose-Capillary: 121 mg/dL — ABNORMAL HIGH (ref 70–99)

## 2011-11-16 MED ORDER — PROMETHAZINE HCL 25 MG PO TABS: 25.0000 mg | ORAL_TABLET | Freq: Three times a day (TID) | ORAL | Status: DC | PRN

## 2011-11-16 MED ORDER — TRAMADOL HCL 50 MG PO TABS: 50.0000 mg | ORAL_TABLET | Freq: Three times a day (TID) | ORAL | Status: DC | PRN

## 2011-11-16 MED ORDER — AMLODIPINE BESYLATE 5 MG PO TABS: 5.0000 mg | ORAL_TABLET | Freq: Every day | ORAL | Status: DC

## 2011-11-16 MED ORDER — SIMVASTATIN 10 MG PO TABS: 10.0000 mg | ORAL_TABLET | Freq: Every evening | ORAL | Status: DC

## 2011-11-16 NOTE — Assessment & Plan Note (Signed)
BP elevated x 2 today, not at goal  Pt already taking lisin/HCTZ 20-12.5 qd  Will add Norvasc 5 mg daily for more control

## 2011-11-16 NOTE — Assessment & Plan Note (Signed)
Consistently elevated lipids Started Zocor 10 mg qhs today Goal LDL <100

## 2011-11-16 NOTE — Progress Notes (Signed)
Subjective:    Patient ID: [name], female    DOB: 07/06/1955, 56 y.o.   MRN: [id number]  HPI Comments: 56 yo woman PMH uncontrolled DM 2, HLD (not on statin), HTN, GERD and other sig PMH (see problem list) presents for chronic ab pain. Ab pain is LUQ sharp rad to back 5-6/10 today.  She went to the ED at [facility] last weds and this is to follow up for that visit.  She is taking OTC Tylenol PM which is not helping her ab pain.  She previously had Vicodin but there was suspcion of her violating the pain contract but pt states she has read and abided by the contract but when she goes to hosp admission for ab pain at [facility] they give her inpatient pain med but no home Rx.  She has taken Ultram previously with relief of ab pain.  She reports assoc sxs with ab pain include decreased appetite, early satiety, nausea, diarrhea after eating and diarrhea assoc with blood while wiping rectally.  She reports Zofran gives her a h/a only Phenergan works for nausea and she needs a refill.    Other ROS: +cp and sob x 1 week while at rest. Sob at rest feels like pt cant take a deep breath lasts ~1 min and goes away. Cp also last ~ 1 min and goes away.  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 Negative for fever.       Decreased appetite, early satiety Lost 4 lbs   Respiratory: Positive for shortness of breath. Negative for cough.   Cardiovascular: Positive for chest pain. Negative for leg swelling.  Gastrointestinal:       +ab pain LUQ rad to back +n and diarrhea +blood per rectum with diarrhea last noted yesterday Denies constipation  Genitourinary: Negative for hematuria.  Neurological: Positive for headaches.       H/a today  assoc with zofran  Psychiatric/Behavioral:       Denies anxiety       Objective:   Physical Exam  Constitutional: She is oriented to person, place, and time. She appears well-developed and well-nourished. She is cooperative. No distress.       Elevated BP Cooperative but agitated when discussing not being able to get Vicodin previously   HENT:  Head: Normocephalic and atraumatic.  Mouth/Throat: Oropharynx is clear and moist and mucous membranes are normal. Abnormal dentition.  Eyes: Conjunctivae are normal. Pupils are equal, round, and reactive to light. No scleral icterus.  Cardiovascular: Normal rate, regular rhythm, S1 normal, S2 normal and normal heart sounds.   No murmur heard.      No edema noted b/l lower ext   Pulmonary/Chest: Effort normal and breath sounds normal. No respiratory distress. She has no wheezes.    Abdominal: Soft. Normal appearance and bowel sounds are normal. She exhibits no distension. There is tenderness in the left upper quadrant.    Neurological: She is alert and oriented to person, place, and time.  Skin: Skin is warm and dry. No rash noted. She is not diaphoretic.  Psychiatric: She has a normal mood and affect. Her speech is normal. She is agitated.       Agitated when  discussing no longer being able to get Vicodin for ab pain          Assessment & Plan:  F/u 3-6 months

## 2011-11-16 NOTE — Assessment & Plan Note (Signed)
Pt having cp assoc with sob at rest Cp may be referred from chronic LUQ ab pain Previous cath 03/2011 neg CAD, previous EKG 2012 shows t wave inversions in V2 and ant. Lat leads, previous CXR 03/2011 neg Advised may be related to LUQ ab pain

## 2011-11-16 NOTE — Assessment & Plan Note (Signed)
Noticed yesterday with diarrhea Pt also noted to have fe def anemia on cbc labs Last colonoscopy in records at [facility] indicate last colonoscopy was in 2005 but pt reports had recent colonoscopy in spring of this year Will get records  PCP to follow up records most recent colonoscopy-will route this note to PCP

## 2011-11-16 NOTE — Assessment & Plan Note (Signed)
Last Ha1c 8.8% Pt is taking Metformin 1000 mg bid

## 2011-11-16 NOTE — Patient Instructions (Signed)
Please start taking all of your new medications for blood pressure and cholesterol Please follow up in 3-6 months, sooner if needed Please remember your mammogram is due November

## 2011-11-16 NOTE — Assessment & Plan Note (Signed)
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 Gery Pray at [facility] with 10/2011 EUS, EGD results w/ no masses, cysts or chronic pancreatitis changes, evidence of pancreatic divism  [name] ab 10/2011 neg 04/2010 ERCP showed pancreatic divisim w/minor sphinctertomy and stent Pt was just at [facility] last weds-will get release of records Assoc sx's   nausea-Rx phenergan, zofran not working  Diarrhea cont immodium  Pain Rx Ultram 50 mg q 8 prn-pt was suspected of violating pain contract in the past for Vicodin but denies stating UDS was + in the past due to a hospital admission not violation Will get CMP, lipase Pt will f/u with Dr. Gery Pray [facility]) by at least 12/2011 but trying to get sooner appt

## 2011-11-17 LAB — COMPLETE METABOLIC PANEL WITH GFR
ALT: 9 U/L (ref 0–35)
AST: 13 U/L (ref 0–37)
Albumin: 5.3 g/dL — ABNORMAL HIGH (ref 3.5–5.2)
CO2: 26 mEq/L (ref 19–32)
Calcium: 10.7 mg/dL — ABNORMAL HIGH (ref 8.4–10.5)
Chloride: 101 mEq/L (ref 96–112)
GFR, Est African American: >89 mL/min
Potassium: 3.6 mEq/L (ref 3.5–5.3)

## 2011-11-17 NOTE — Progress Notes (Signed)
agree

## 2012-02-27 ENCOUNTER — Encounter (HOSPITAL_COMMUNITY): Payer: Self-pay | Admitting: Emergency Medicine

## 2012-02-27 ENCOUNTER — Inpatient Hospital Stay (HOSPITAL_COMMUNITY)
Admission: EM | Admit: 2012-02-27 | Discharge: 2012-03-03 | DRG: 204 | Disposition: A | Discharge status: Home / Self Care | Payer: BC Managed Care – PPO | Attending: Internal Medicine | Admitting: Internal Medicine

## 2012-02-27 DIAGNOSIS — E1165 Type 2 diabetes mellitus with hyperglycemia: Secondary | ICD-10-CM | POA: Diagnosis present

## 2012-02-27 DIAGNOSIS — G8929 Other chronic pain: Secondary | ICD-10-CM

## 2012-02-27 DIAGNOSIS — E860 Dehydration: Secondary | ICD-10-CM | POA: Diagnosis present

## 2012-02-27 DIAGNOSIS — K859 Acute pancreatitis without necrosis or infection, unspecified: Principal | ICD-10-CM | POA: Diagnosis present

## 2012-02-27 DIAGNOSIS — D569 Thalassemia, unspecified: Secondary | ICD-10-CM

## 2012-02-27 DIAGNOSIS — E119 Type 2 diabetes mellitus without complications: Secondary | ICD-10-CM | POA: Diagnosis present

## 2012-02-27 DIAGNOSIS — Q453 Other congenital malformations of pancreas and pancreatic duct: Secondary | ICD-10-CM

## 2012-02-27 DIAGNOSIS — Z8542 Personal history of malignant neoplasm of other parts of uterus: Secondary | ICD-10-CM

## 2012-02-27 DIAGNOSIS — G43909 Migraine, unspecified, not intractable, without status migrainosus: Secondary | ICD-10-CM | POA: Diagnosis present

## 2012-02-27 DIAGNOSIS — E785 Hyperlipidemia, unspecified: Secondary | ICD-10-CM | POA: Diagnosis present

## 2012-02-27 DIAGNOSIS — E876 Hypokalemia: Secondary | ICD-10-CM | POA: Diagnosis present

## 2012-02-27 DIAGNOSIS — D561 Beta thalassemia: Secondary | ICD-10-CM | POA: Diagnosis present

## 2012-02-27 DIAGNOSIS — R109 Unspecified abdominal pain: Secondary | ICD-10-CM

## 2012-02-27 DIAGNOSIS — R110 Nausea: Secondary | ICD-10-CM

## 2012-02-27 DIAGNOSIS — Z79899 Other long term (current) drug therapy: Secondary | ICD-10-CM

## 2012-02-27 DIAGNOSIS — I10 Essential (primary) hypertension: Secondary | ICD-10-CM | POA: Diagnosis present

## 2012-02-27 DIAGNOSIS — Z8673 Personal history of transient ischemic attack (TIA), and cerebral infarction without residual deficits: Secondary | ICD-10-CM

## 2012-02-27 DIAGNOSIS — Z86711 Personal history of pulmonary embolism: Secondary | ICD-10-CM

## 2012-02-27 HISTORY — DX: Malignant neoplasm of uterus, part unspecified: C55

## 2012-02-27 HISTORY — DX: Unspecified convulsions: R56.9

## 2012-02-27 HISTORY — DX: Type 2 diabetes mellitus without complications: E11.9

## 2012-02-27 LAB — CREATININE, SERUM
Creatinine, Ser: 0.61 mg/dL (ref 0.50–1.10)
GFR calc non Af Amer: >90 mL/min (ref >90)

## 2012-02-27 LAB — BASIC METABOLIC PANEL
BUN: 10 mg/dL (ref 6–23)
Chloride: 107 mEq/L (ref 96–112)
GFR calc non Af Amer: >90 mL/min (ref >90)
Glucose, Bld: 123 mg/dL — ABNORMAL HIGH (ref 70–99)
Potassium: 3.5 mEq/L (ref 3.5–5.1)

## 2012-02-27 LAB — COMPREHENSIVE METABOLIC PANEL
BUN: 13 mg/dL (ref 6–23)
CO2: 25 mEq/L (ref 19–32)
Calcium: 10.1 mg/dL (ref 8.4–10.5)
GFR calc Af Amer: >90 mL/min (ref >90)
GFR calc non Af Amer: >90 mL/min (ref >90)
Glucose, Bld: 364 mg/dL — ABNORMAL HIGH (ref 70–99)
Total Protein: 9.4 g/dL — ABNORMAL HIGH (ref 6.0–8.3)

## 2012-02-27 LAB — URINE MICROSCOPIC-ADD ON

## 2012-02-27 LAB — URINALYSIS, ROUTINE W REFLEX MICROSCOPIC
Bilirubin Urine: NEGATIVE
Ketones, ur: NEGATIVE mg/dL
Nitrite: NEGATIVE
Specific Gravity, Urine: 1.021 (ref 1.005–1.030)
Urobilinogen, UA: 0.2 mg/dL (ref 0.0–1.0)
pH: 5 (ref 5.0–8.0)

## 2012-02-27 LAB — GLUCOSE, CAPILLARY: Glucose-Capillary: 293 mg/dL — ABNORMAL HIGH (ref 70–99)

## 2012-02-27 LAB — MRSA PCR SCREENING: MRSA by PCR: NEGATIVE

## 2012-02-27 LAB — LIPASE, BLOOD: Lipase: 57 U/L (ref 11–59)

## 2012-02-27 LAB — CBC WITH DIFFERENTIAL/PLATELET
Basophils Absolute: 0 10*3/uL (ref 0.0–0.1)
Basophils Relative: 0 % (ref 0–1)
Eosinophils Absolute: 0.1 10*3/uL (ref 0.0–0.7)
Hemoglobin: 14.2 g/dL (ref 12.0–15.0)
MCH: 27.2 pg (ref 26.0–34.0)
MCHC: 35.1 g/dL (ref 30.0–36.0)
Monocytes Absolute: 0.7 10*3/uL (ref 0.1–1.0)
Neutrophils Relative %: 52 % (ref 43–77)
Platelets: 391 10*3/uL (ref 150–400)
RDW: 14.4 % (ref 11.5–15.5)

## 2012-02-27 LAB — CBC
HCT: 38.2 % (ref 36.0–46.0)
Hemoglobin: 13.3 g/dL (ref 12.0–15.0)
MCHC: 34.8 g/dL (ref 30.0–36.0)
RBC: 4.93 MIL/uL (ref 3.87–5.11)

## 2012-02-27 MED ORDER — HEPARIN SODIUM (PORCINE) 5000 UNIT/ML IJ SOLN
5000.0000 [IU] | Freq: Three times a day (TID) | INTRAMUSCULAR | Status: DC
  Administered 2012-02-27 – 2012-03-03 (×14): 5000 [IU] via SUBCUTANEOUS
  Filled 2012-02-27 (×17): qty 1

## 2012-02-27 MED ORDER — ACETAMINOPHEN 325 MG PO TABS: 650.0000 mg | ORAL_TABLET | Freq: Four times a day (QID) | ORAL | Status: DC | PRN

## 2012-02-27 MED ORDER — ACETAMINOPHEN 650 MG RE SUPP: 650.0000 mg | Freq: Four times a day (QID) | RECTAL | Status: DC | PRN

## 2012-02-27 MED ORDER — ONDANSETRON HCL 4 MG/2ML IJ SOLN
4.0000 mg | Freq: Four times a day (QID) | INTRAMUSCULAR | Status: DC | PRN
  Administered 2012-02-28 (×2): 4 mg via INTRAVENOUS
  Filled 2012-02-27 (×2): qty 2

## 2012-02-27 MED ORDER — SODIUM CHLORIDE 0.9 % IV SOLN
INTRAVENOUS | Status: DC
  Administered 2012-02-27 – 2012-02-28 (×5): via INTRAVENOUS
  Administered 2012-02-28: 75 mL/h via INTRAVENOUS
  Administered 2012-02-29 – 2012-03-02 (×5): via INTRAVENOUS
  Administered 2012-03-03: 100 mL/h via INTRAVENOUS
  Administered 2012-03-03: 06:00:00 via INTRAVENOUS

## 2012-02-27 MED ORDER — LISINOPRIL 20 MG PO TABS
20.0000 mg | ORAL_TABLET | Freq: Every day | ORAL | Status: DC
  Administered 2012-02-27 – 2012-03-03 (×6): 20 mg via ORAL
  Filled 2012-02-27 (×6): qty 1

## 2012-02-27 MED ORDER — ALUM & MAG HYDROXIDE-SIMETH 200-200-20 MG/5ML PO SUSP: 30.0000 mL | Freq: Four times a day (QID) | ORAL | Status: DC | PRN

## 2012-02-27 MED ORDER — LISINOPRIL-HYDROCHLOROTHIAZIDE 20-12.5 MG PO TABS: 1.0000 | ORAL_TABLET | Freq: Every day | ORAL | Status: DC

## 2012-02-27 MED ORDER — MORPHINE SULFATE 2 MG/ML IJ SOLN
2.0000 mg | INTRAMUSCULAR | Status: DC | PRN
Start: 2012-02-27 — End: 2012-02-27
  Administered 2012-02-27: 2 mg via INTRAVENOUS
  Filled 2012-02-27: qty 1

## 2012-02-27 MED ORDER — INSULIN ASPART 100 UNIT/ML ~~LOC~~ SOLN
0.0000 [IU] | SUBCUTANEOUS | Status: DC
  Administered 2012-02-27: 5 [IU] via SUBCUTANEOUS
  Filled 2012-02-27: qty 1

## 2012-02-27 MED ORDER — MORPHINE SULFATE 2 MG/ML IJ SOLN
2.0000 mg | INTRAMUSCULAR | Status: DC | PRN
  Administered 2012-02-27 (×2): 2 mg via INTRAVENOUS
  Filled 2012-02-27 (×2): qty 1

## 2012-02-27 MED ORDER — ONDANSETRON HCL 4 MG/2ML IJ SOLN
4.0000 mg | Freq: Once | INTRAMUSCULAR | Status: AC
  Administered 2012-02-27: 4 mg via INTRAVENOUS
  Filled 2012-02-27: qty 2

## 2012-02-27 MED ORDER — SODIUM CHLORIDE 0.9 % IV SOLN
INTRAVENOUS | Status: DC
  Administered 2012-02-27: 09:00:00 via INTRAVENOUS

## 2012-02-27 MED ORDER — PANCRELIPASE (LIP-PROT-AMYL) 12000-38000 UNITS PO CPEP
2.0000 | ORAL_CAPSULE | Freq: Three times a day (TID) | ORAL | Status: DC
  Administered 2012-03-01 (×2): 2 via ORAL
  Administered 2012-03-02 – 2012-03-03 (×4): 1 via ORAL
  Administered 2012-03-03: 2 via ORAL
  Filled 2012-02-27 (×17): qty 2

## 2012-02-27 MED ORDER — PANCRELIPASE (LIP-PROT-AMYL) 12000-38000 UNITS PO CPEP
1.0000 | ORAL_CAPSULE | Freq: Every day | ORAL | Status: DC | PRN
  Filled 2012-02-27: qty 1

## 2012-02-27 MED ORDER — HYDROCHLOROTHIAZIDE 12.5 MG PO CAPS
12.5000 mg | ORAL_CAPSULE | Freq: Every day | ORAL | Status: DC
  Administered 2012-02-28 – 2012-03-02 (×4): 12.5 mg via ORAL
  Filled 2012-02-27 (×5): qty 1

## 2012-02-27 MED ORDER — SODIUM CHLORIDE 0.9 % IV BOLUS (SEPSIS)
1000.0000 mL | Freq: Once | INTRAVENOUS | Status: AC
  Administered 2012-02-27: 1000 mL via INTRAVENOUS

## 2012-02-27 MED ORDER — PANCRELIPASE (LIP-PROT-AMYL) 12000-38000 UNITS PO CPEP: 1.0000 | ORAL_CAPSULE | Freq: Four times a day (QID) | ORAL | Status: DC

## 2012-02-27 MED ORDER — INSULIN ASPART 100 UNIT/ML ~~LOC~~ SOLN
0.0000 [IU] | SUBCUTANEOUS | Status: DC
  Administered 2012-02-27 – 2012-02-28 (×4): 2 [IU] via SUBCUTANEOUS
  Administered 2012-02-29: 3 [IU] via SUBCUTANEOUS
  Administered 2012-02-29: 2 [IU] via SUBCUTANEOUS
  Administered 2012-03-01 (×2): 3 [IU] via SUBCUTANEOUS
  Administered 2012-03-01: 2 [IU] via SUBCUTANEOUS
  Administered 2012-03-01: 3 [IU] via SUBCUTANEOUS
  Administered 2012-03-02 (×2): 2 [IU] via SUBCUTANEOUS
  Administered 2012-03-02 – 2012-03-03 (×3): 3 [IU] via SUBCUTANEOUS

## 2012-02-27 MED ORDER — MORPHINE SULFATE 4 MG/ML IJ SOLN
4.0000 mg | INTRAMUSCULAR | Status: DC | PRN
  Administered 2012-02-27 – 2012-02-28 (×6): 4 mg via INTRAVENOUS
  Filled 2012-02-27 (×6): qty 1

## 2012-02-27 MED ORDER — ONDANSETRON HCL 4 MG PO TABS: 4.0000 mg | ORAL_TABLET | Freq: Four times a day (QID) | ORAL | Status: DC | PRN

## 2012-02-27 MED ORDER — HYDROMORPHONE HCL PF 1 MG/ML IJ SOLN
1.0000 mg | Freq: Once | INTRAMUSCULAR | Status: AC
  Administered 2012-02-27: 1 mg via INTRAVENOUS
  Filled 2012-02-27: qty 1

## 2012-02-27 NOTE — H&P (Signed)
Hospital Admission Note Date: 02/27/2012  Patient name: [name] Medical record number: [id number] Date of birth: 06-25-1955 Age: 56 y.o. Gender: female PCP: Johnette Abraham, DO  Medical Service: Internal medicine teaching service  Attending physician: Dr. Josem Kaufmann    1st Contact: Kazibwe    Pager: [phone] 2nd Contact: Illath    Pager: [phone] After 5 pm or weekends: 1st Contact:      Pager: [phone] 2nd Contact:      Pager: [phone]  Chief Complaint: abdominal pain x 2-3 days  History of Present Illness: 56 year old woman with past medical history significant for pancreatic divisum status post stents( placement in 2011 and removal in 07/2010) follows with Dr. Allyson Sabal at [facility], type 2 diabetes mellitus, hypertension presents to ER with abdominal pain for 2-3 days.  Patient has a long-standing history of intermittent left upper quadrant pain. She was in her usual state of health until 3 days ago(Friday) when she started having severe abdominal pain. Her pain is very typical to her previous episodes - describes it as a sharp pain, located in the left upper quadrant radiating to her back. When she presented to the ER, she rated her pain 20 out of 10 but after a shot of pain medication she currently rates her pain 9/10. She took some Motrin at home but that did not relieve her symptoms and this morning, she decided to come to ER to seek some medical help. She also reports some nausea but denies any vomiting. She also reports some episodes of loose watery stools yesterday when she used the bathroom about 5-6 times. Denies any melena, fever, chills, chest pain, shortness of breath, headaches. Also denies any sick contacts in the family.  She states that she has not been taking her metformin for last 2 days with the fear of dropping her sugars low as she was not eating much.   Meds: Current Outpatient Rx  Name Route Sig Dispense Refill  . CALCIUM CARBONATE ANTACID 500 MG PO CHEW Oral  Chew 1 tablet by mouth 2 (two) times daily as needed. Indigestion    . IBUPROFEN-DIPHENHYDRAMINE CIT 200-38 MG PO TABS Oral Take 1 tablet by mouth at bedtime as needed. For pain/sleep    . PANCRELIPASE (LIP-PROT-AMYL) 12000 UNITS PO CPEP Oral Take 1-2 capsules by mouth 4 (four) times daily. 2 capsules with meals (breakfast, lunch and dinner) 1 capsule with snack    . LISINOPRIL-HYDROCHLOROTHIAZIDE 20-12.5 MG PO TABS Oral Take 1 tablet by mouth daily.    . IMODIUM PO Oral Take 1 tablet by mouth as needed. After each loose stool    . METFORMIN HCL 1000 MG PO TABS Oral Take 1,000 mg by mouth 2 (two) times daily with a meal.    . PROMETHAZINE HCL 25 MG PO TABS Oral Take 25 mg by mouth every 8 (eight) hours as needed. For nausea      Allergies: Allergies as of 02/27/2012 - Review Complete 02/27/2012  Allergen Reaction Noted  . Shrimp flavor Anaphylaxis 08/25/2010  . Fioricet (butalbital-apap-caffeine) Other (See Comments)   . Sulfonamide derivatives Rash 07/13/2006   Past Medical History  Diagnosis Date  . Hypertension   . HLD (hyperlipidemia) 2009  . Sickle cell trait   . Migraine headache   . Pulmonary embolus 2006    08/2004 -   Two areas of V/Q mismatch. Findings compatible  with high probability for pulmonary embolus.; pt was on coumadin for 1 year  . Pancreatic divisum     S/P ERCP with  stenting 07/2010 at [facility], then stent removal (08/05/2010)  . Diabetes mellitus 2010    well controlled  . Seizure disorder   . Beta thalassemia   . CVA (cerebral infarction) 1993    Per report by patient she had a stroke and prolonged rehab course eventually resolving her right sided weakness, as per her hx obtained 08/2007  . Asthma     childhood asthma  . Cancer     of uterus  . GERD (gastroesophageal reflux disease)   . Stroke     1993, very small amount of left sided weakness  . Blood dyscrasia     thalasemia, sickle cell trait  . Chronic abdominal pain     Unclear etiology, thought  to be due to chronic pancreatitis previously in setting of her pancreatic divisum - however, EUS and EGD performed at [facility]  (10/05/2011) showing normal esophageall, gastric, duodenal mucosa. EUS showing no pancreatic masses, cysts, or changes of chronic pancreatitis. Biliary system nondilated and had no endosonographic abnormalities.   Past Surgical History  Procedure Date  . Abdominal hysterectomy age 59 yo    2/2 endometriosis  . Pancreatic stent placement/removal     2011 and 2012 (total stents x 2) As of 11/16/11 no stents in place  . Tumors     breast tumors (left milk duct tumor benign per pt) removed  in 1980's  . Breast surgery    Family History  Problem Relation Age of Onset  . Prostate cancer Father   . Cancer Father     stomach ca died x 1 year ago  . Sickle cell anemia Brother   . Lung cancer Maternal Aunt   . Lung cancer Maternal Uncle   . Breast cancer Maternal Grandmother   . Heart disease Maternal Grandfather   . Heart disease Paternal Grandfather   . Diabetes Other   . Cancer Other     multiple cancers pancreas,colon, breast   History   Social History  . Marital Status: Married    Spouse Name: N/A    Number of Children: N/A  . Years of Education: N/A   Occupational History  . Not on file.   Social History Main Topics  . Smoking status: Former Smoker -- 0.5 packs/day for 30 years    Quit date: 05/02/2000  . Smokeless tobacco: Not on file  . Alcohol Use: No  . Drug Use: No  . Sexually Active: Yes   Other Topics Concern  . Not on file   Social History Narrative   Works at [employer] as a Merchandiser, retail, on Health visitor about 9 hours/ day    Review of Systems: Constitutional: negative Respiratory: negative for cough, dyspnea on exertion, hemoptysis, sputum, stridor and wheezing Cardiovascular: negative for chest pain, fatigue, irregular heart beat and palpitations Gastrointestinal: negative for vomiting Musculoskeletal:negative for  arthralgias Neurological: negative for dizziness, paresthesia, seizures and tremors  Physical Exam: Blood pressure 143/82, pulse 96, temperature 97.8 F (36.6 C), temperature source Oral, resp. rate 20, SpO2 94.00%. BP 143/82  Pulse 96  Temp 97.8 F (36.6 C) (Oral)  Resp 20  SpO2 94%  General Appearance:    Alert, cooperative,moderate distress from pain, appears stated age, tearful during the exam  Head:    Normocephalic, without obvious abnormality, atraumatic  Eyes:    PERRL, conjunctiva/corneas clear, EOM's intact, fundi    benign, both eyes  Ears:    Normal TM's and external ear canals, both ears  Nose:   Nares normal,  septum midline, mucosa normal, no drainage    or sinus tenderness  Throat:   Lips, dry mucosa, and tongue normal; poor dentition, several missing teeth  Neck:   Supple, symmetrical, trachea midline, no adenopathy;    thyroid:  no enlargement/tenderness/nodules; no carotid   bruit or JVD  Back:     Symmetric, no curvature, ROM normal, no CVA tenderness  Lungs:     Clear to auscultation bilaterally, respirations unlabored  Chest Wall:    No tenderness or deformity   Heart:    Tachycardic, S1 and S2 normal, no murmur, rub   or gallop  Breast Exam:    No tenderness, masses, or nipple abnormality  Abdomen:     Soft, tender to palpation in the LUQ, voluntary guarding but no rigidity bowel sounds active all four quadrants,    no masses, no organomegaly  Genitalia:    Normal female without lesion, discharge or tenderness  Rectal:    Normal tone, normal prostate, no masses or tenderness;   guaiac negative stool  Extremities:   Extremities normal, atraumatic, no cyanosis or edema  Pulses:   2+ and symmetric all extremities  Skin:   Skin color, texture, turgor normal, no rashes or lesions  Lymph nodes:   Cervical, supraclavicular, and axillary nodes normal  Neurologic:   CNII-XII intact, normal strength, sensation and reflexes    throughout    Lab results: Basic  Metabolic Panel:  Basename 02/27/12 0833  NA 135  K 4.6  CL 95*  CO2 25  GLUCOSE 364*  BUN 13  CREATININE 0.62  CALCIUM 10.1  MG --  PHOS --   Liver Function Tests:  Basename 02/27/12 0833  AST 32  ALT 16  ALKPHOS 119*  BILITOT 0.5  PROT 9.4*  ALBUMIN 4.5    Basename 02/27/12 0833  LIPASE 57  AMYLASE --   CBC:  Basename 02/27/12 0833  WBC 8.5  NEUTROABS 4.4  HGB 14.2  HCT 40.4  MCV 77.2*  PLT 391   Urine Drug Screen: Drugs of Abuse     Component Value Date/Time   LABOPIA NEG 08/30/2011 1526   LABOPIA NONE DETECTED 09/11/2009 1309   COCAINSCRNUR NEG 08/30/2011 1526   COCAINSCRNUR NONE DETECTED 09/11/2009 1309   LABBENZ NEG 08/30/2011 1526   LABBENZ NEG 01/24/2011 1414   LABBENZ NONE DETECTED 09/11/2009 1309   AMPHETMU NEG 01/24/2011 1414   AMPHETMU NONE DETECTED 09/11/2009 1309   THCU NONE DETECTED 09/11/2009 1309   LABBARB NEG 08/30/2011 1526   LABBARB  Value: NONE DETECTED        DRUG SCREEN FOR MEDICAL PURPOSES ONLY.  IF CONFIRMATION IS NEEDED FOR ANY PURPOSE, NOTIFY LAB WITHIN 5 DAYS.        LOWEST DETECTABLE LIMITS FOR URINE DRUG SCREEN Drug Class       Cutoff (ng/mL) Amphetamine      1000 Barbiturate      200 Benzodiazepine   200 Tricyclics       300 Opiates          300 Cocaine          300 THC              50 09/11/2009 1309      Assessment & Plan by Problem: 56 year old woman with past medical history significant for pancreatic divisum, type 2 diabetes mellitus, hypertension presents to the ER for acute on chronic abdominal pain.  1. Acute on chronic abdominal pain: She presents with acute on chronic  abdominal pain for last 3 days. Patient has a known history of pancreatic divisum status post stents (in 2011 and removal in 2012). She recently underwent EUS and EGD on 10/05/2011 reviewed normal esophageal  and duodenal mucosa  with no masses, cysts or changes consistent with chronic pancreatitis. Her exam is positive for left upper quadrant tenderness. She is  tachycardic and mildly hypertensive which could be probably related to her pain level. On her labs she does not have any evidence of leukocytosis and her lipase is normal. Her alkaline phosphatase is mildly elevated. The etiology for her acute on chronic exacerbation is not clear. Differentials include chronic pancreatitis in the setting of pancreatic divisum versus peptic ulcer disease versus gastritis. I suspect her pain is likely from pancreatitis. PUD and gastritis appear less likely in the setting of normal EGD in 06/13.  -Admit to MedSurg bed. -Bowel rest -Hydration with IV fluids -Morphine for pain -Zofran for nausea.  2. Elevated anion gap: She is hypochloremic likely from dehydration that could explain mild anion gap of 15. Her bicarbonate level is within normal range. Her blood glucose is moderately elevated. Other possibility includes DKA but her UA is pending. She denies any history of intoxication -Continue hydration. -Followup UA to look for ketones -Recheck BMET  3. Type 2 diabetes mellitus: Last A1c was 8.8 in June of 2013. Her blood sugars are markedly elevated today. She reports missing her metformin doses. - Recheck A1c - Sliding scale insulin  4. Hypertension: Blood pressure mildly elevated likely secondary to acute distress. -Continue to monitor her blood pressure.   5. Thalassemia:  H and H satble.  - Continue to monitor.   6. DVT: heparin     Signed: Kyjuan Gause 02/27/2012, 10:45 AM

## 2012-02-27 NOTE — ED Notes (Signed)
Pt c/o left upper abd pain that radiates around into back. Pt stated that she has "some kind of abnormality with her pancreas" which started May 2011. Also c/o nausea.

## 2012-02-27 NOTE — ED Provider Notes (Signed)
History     CSN: [id number]  Arrival date & time 02/27/12  0750   First MD Initiated Contact with Patient 02/27/12 [phone]      Chief Complaint  Patient presents with  . Abdominal Pain  . Back Pain     HPI Patient with long-standing history of chronic pancreatitis presents with abdominal pain and nausea but no vomiting which she states feels very similar to her previous bouts of pancreatitis.  Patient denies fever with this pain.  She has had some diarrhea. Past Medical History  Diagnosis Date  . Hypertension   . HLD (hyperlipidemia) 2009  . Sickle cell trait   . Pulmonary embolus 08/2004    Two areas of V/Q mismatch. Findings compatible  with high probability for pulmonary embolus.; pt was on coumadin for 1 year  . Pancreatic divisum     S/P ERCP with stenting 07/2010 at [facility], then stent removal (08/05/2010)  . CVA (cerebral infarction) 1993    Per report by patient she had a stroke and prolonged rehab course eventually resolving her right sided weakness, as per her hx obtained 08/2007  . Blood dyscrasia     thalasemia, sickle cell trait  . Chronic abdominal pain     Unclear etiology, thought to be due to chronic pancreatitis previously in setting of her pancreatic divisum - however, EUS and EGD performed at [facility]  (10/05/2011) showing normal esophageall, gastric, duodenal mucosa. EUS showing no pancreatic masses, cysts, or changes of chronic pancreatitis. Biliary system nondilated and had no endosonographic abnormalities.  . Type II diabetes mellitus 2010    well controlled  . Asthma     childhood asthma  . Pneumonia     "when I was a kid" (02/27/2012)  . Beta thalassemia   . Migraine headache   . Seizures 1993    "first year after my stroke" (02/27/2012)  . Stroke 1993    very small amount of left sided weakness  . Uterine cancer     Past Surgical History  Procedure Date  . Pancreatic stent placement/removal     placed in 2011; removed in 2012/H&P  (02/27/2012)  . Abdominal hysterectomy 1980    2/2 endometriosis  . Breast lumpectomy 1980's    bilaterally; "in my milk ducts; both benign" (02/27/2012)    Family History  Problem Relation Age of Onset  . Prostate cancer Father   . Cancer Father     stomach ca died x 1 year ago  . Sickle cell anemia Brother   . Lung cancer Maternal Aunt   . Lung cancer Maternal Uncle   . Breast cancer Maternal Grandmother   . Heart disease Maternal Grandfather   . Heart disease Paternal Grandfather   . Diabetes Other   . Cancer Other     multiple cancers pancreas,colon, breast    History  Substance Use Topics  . Smoking status: Former Smoker -- 0.5 packs/day for 30 years    Types: Cigarettes    Quit date: 01/01/1999  . Smokeless tobacco: Never Used  . Alcohol Use: No    OB History    Grav Para Term Preterm Abortions TAB SAB Ect Mult Living                  Review of Systems All other systems reviewed and are negative Allergies  Fioricet; Shrimp flavor; and Sulfonamide derivatives  Home Medications   No current outpatient prescriptions on file.  BP 106/63  Pulse 80  Temp 97.5 F (  36.4 C) (Oral)  Resp 16  SpO2 94%  Physical Exam  Nursing note and vitals reviewed. Constitutional: She is oriented to person, place, and time. She appears well-developed and well-nourished. No distress.  HENT:  Head: Normocephalic and atraumatic.  Eyes: Pupils are equal, round, and reactive to light.  Neck: Normal range of motion.  Cardiovascular: Normal rate and intact distal pulses.   Pulmonary/Chest: No respiratory distress.  Abdominal: Normal appearance. She exhibits no distension. There is tenderness.         Pain radiates through to the back  Musculoskeletal: Normal range of motion.  Neurological: She is alert and oriented to person, place, and time. No cranial nerve deficit.  Skin: Skin is warm and dry. No rash noted.  Psychiatric: She has a normal mood and affect. Her behavior is  normal.    ED Course  Procedures (including critical care )  Labs Reviewed  CBC WITH DIFFERENTIAL - Abnormal; Notable for the following:    RBC 5.23 (*)     MCV 77.2 (*)     All other components within normal limits  COMPREHENSIVE METABOLIC PANEL - Abnormal; Notable for the following:    Chloride 95 (*)     Glucose, Bld 364 (*)     Total Protein 9.4 (*)     Alkaline Phosphatase 119 (*)     All other components within normal limits  URINALYSIS, ROUTINE W REFLEX MICROSCOPIC - Abnormal; Notable for the following:    APPearance HAZY (*)     Glucose, UA >1000 (*)     Leukocytes, UA MODERATE (*)     All other components within normal limits  URINE MICROSCOPIC-ADD ON - Abnormal; Notable for the following:    Squamous Epithelial / LPF FEW (*)     All other components within normal limits  GLUCOSE, CAPILLARY - Abnormal; Notable for the following:    Glucose-Capillary 293 (*)     All other components within normal limits  CBC - Abnormal; Notable for the following:    MCV 77.5 (*)     All other components within normal limits  BASIC METABOLIC PANEL - Abnormal; Notable for the following:    Glucose, Bld 123 (*)     All other components within normal limits  GLUCOSE, CAPILLARY - Abnormal; Notable for the following:    Glucose-Capillary 138 (*)     All other components within normal limits  GLUCOSE, CAPILLARY - Abnormal; Notable for the following:    Glucose-Capillary 119 (*)     All other components within normal limits  GLUCOSE, CAPILLARY - Abnormal; Notable for the following:    Glucose-Capillary 115 (*)     All other components within normal limits  GLUCOSE, CAPILLARY - Abnormal; Notable for the following:    Glucose-Capillary 109 (*)     All other components within normal limits  LIPASE, BLOOD  CREATININE, SERUM  MRSA PCR SCREENING  HEMOGLOBIN A1C  CBC  COMPREHENSIVE METABOLIC PANEL  URINE RAPID DRUG SCREEN (HOSP PERFORMED)   No results found.   1. Pancreatitis        MDM          Nelia Shi, MD 02/28/12 0000

## 2012-02-28 LAB — COMPREHENSIVE METABOLIC PANEL
BUN: 7 mg/dL (ref 6–23)
CO2: 26 mEq/L (ref 19–32)
Chloride: 110 mEq/L (ref 96–112)
Creatinine, Ser: 0.61 mg/dL (ref 0.50–1.10)
GFR calc non Af Amer: >90 mL/min (ref >90)
Total Bilirubin: 0.3 mg/dL (ref 0.3–1.2)

## 2012-02-28 LAB — CBC
HCT: 29.9 % — ABNORMAL LOW (ref 36.0–46.0)
Hemoglobin: 10 g/dL — ABNORMAL LOW (ref 12.0–15.0)
MCV: 78.3 fL (ref 78.0–100.0)
RBC: 3.82 MIL/uL — ABNORMAL LOW (ref 3.87–5.11)
WBC: 8 10*3/uL (ref 4.0–10.5)

## 2012-02-28 LAB — RAPID URINE DRUG SCREEN, HOSP PERFORMED
Amphetamines: NOT DETECTED
Barbiturates: NOT DETECTED
Benzodiazepines: NOT DETECTED
Cocaine: NOT DETECTED
Tetrahydrocannabinol: NOT DETECTED

## 2012-02-28 LAB — GLUCOSE, CAPILLARY
Glucose-Capillary: 114 mg/dL — ABNORMAL HIGH (ref 70–99)
Glucose-Capillary: 125 mg/dL — ABNORMAL HIGH (ref 70–99)

## 2012-02-28 LAB — HEMOGLOBIN A1C
Hgb A1c MFr Bld: 8.4 % — ABNORMAL HIGH (ref <5.7)
Mean Plasma Glucose: 194 mg/dL — ABNORMAL HIGH (ref <117)

## 2012-02-28 MED ORDER — HYDROMORPHONE HCL PF 1 MG/ML IJ SOLN
2.0000 mg | INTRAMUSCULAR | Status: DC | PRN
  Administered 2012-02-28: 3 mg via INTRAVENOUS
  Administered 2012-02-28: 2 mg via INTRAVENOUS
  Administered 2012-02-29 (×3): 3 mg via INTRAVENOUS
  Filled 2012-02-28: qty 2
  Filled 2012-02-28 (×4): qty 3

## 2012-02-28 MED ORDER — POTASSIUM CHLORIDE 10 MEQ/100ML IV SOLN
10.0000 meq | INTRAVENOUS | Status: AC
  Administered 2012-02-28 (×2): 10 meq via INTRAVENOUS
  Filled 2012-02-28 (×2): qty 100

## 2012-02-28 MED ORDER — PROMETHAZINE HCL 25 MG/ML IJ SOLN
12.5000 mg | INTRAMUSCULAR | Status: DC | PRN
  Administered 2012-02-29 – 2012-03-03 (×7): 12.5 mg via INTRAVENOUS
  Filled 2012-02-28 (×7): qty 1

## 2012-02-28 NOTE — Progress Notes (Signed)
Subjective:    Interval Events:   She continues to complain of severe pain. She reports that she had a lot pain last night and she did not sleep She continues to feel nauseous without vomiting. She says that the Zofran gives her headaches and does not control her nausea adeqautely.     Objective:    Vital Signs:   Temp:  [97.5 F (36.4 C)-98.6 F (37 C)] 98.6 F (37 C) (10/29 1405) Pulse Rate:  [70-87] 76  (10/29 1405) Resp:  [16-18] 16  (10/29 1405) BP: (91-139)/(56-79) 123/63 mmHg (10/29 1405) SpO2:  [94 %-98 %] 96 % (10/29 1405) Last BM Date: 02/26/12   Weights: 24-hour Weight change:   There were no vitals filed for this visit.   Intake/Output:   Intake/Output Summary (Last 24 hours) at 02/28/12 1502 Last data filed at 02/28/12 1300  Gross per 24 hour  Intake   1286 ml  Output      0 ml  Net   1286 ml       Physical Exam: Filed Vitals:   02/28/12 1405  BP: 123/63  Pulse: 76  Temp: 98.6 F (37 C)  Resp: 16   Constitutional: She is oriented to person, place, and time. She appears well-developed and well-nourished. She is tearful due to pain.  Head: Normocephalic and atraumatic.  Eyes: Pupils are equal, round, and reactive to light.  Neck: Normal range of motion.  Raspatory: Clear chest bilaterally.  Cardiovascular: Normal rate and intact distal pulses.  Pulmonary/Chest: No respiratory distress.  Abdominal: Normal appearance. She exhibits no distension. She has severe tenderness on palpation of the LUQ without rebound or guarding. The bowel sounds are slightly reduced. There are no palpable masses.  Labs: Basic Metabolic Panel:  Lab 02/28/12 1610 02/27/12 1544 02/27/12 1408 02/27/12 0833  NA 143 143 -- 135  K 3.2* 3.5 -- 4.6  CL 110 107 -- 95*  CO2 26 27 -- 25  GLUCOSE 93 123* -- 364*  BUN 7 10 -- 13  CREATININE 0.61 0.61 0.61 0.62  CALCIUM 8.2* 8.9 -- 10.1  MG -- -- -- --  PHOS -- -- -- --    Liver Function Tests:  Lab 02/28/12 1005  02/27/12 0833  AST 15 32  ALT 10 16  ALKPHOS 84 119*  BILITOT 0.3 0.5  PROT 6.5 9.4*  ALBUMIN 3.1* 4.5    Lab 02/27/12 0833  LIPASE 57  AMYLASE --   CBC:  Lab 02/28/12 1005 02/27/12 1408 02/27/12 0833  WBC 8.0 9.9 8.5  NEUTROABS -- -- 4.4  HGB 10.0* 13.3 14.2  HCT 29.9* 38.2 40.4  MCV 78.3 77.5* 77.2*  PLT 335 365 391    CBG:  Lab 02/28/12 1225 02/28/12 0739 02/28/12 0345 02/27/12 2323 02/27/12 2132  GLUCAP 114* 123* 149* 109* 115*       Medications:    Infusions:    . sodium chloride 75 mL/hr at 02/28/12 1005  . DISCONTD: sodium chloride Stopped (02/27/12 1137)     Scheduled Medications:    . heparin  5,000 Units Subcutaneous Q8H  . lisinopril  20 mg Oral Daily   And  . hydrochlorothiazide  12.5 mg Oral Daily  . insulin aspart  0-15 Units Subcutaneous Q4H  . lipase/protease/amylase  2 capsule Oral TID WC  . potassium chloride  10 mEq Intravenous Q1 Hr x 2     PRN Medications: acetaminophen, acetaminophen, alum & mag hydroxide-simeth, HYDROmorphone (DILAUDID) injection, lipase/protease/amylase, promethazine, DISCONTD:  morphine  injection, DISCONTD:  morphine injection, DISCONTD:  morphine injection, DISCONTD: ondansetron (ZOFRAN) IV, DISCONTD: ondansetron   Assessment/ Plan:    56 year old woman with past medical history significant for pancreatic divisum status post stents( placement in 2011 and removal in 07/2010) follows with Dr. Allyson Sabal at [facility], type 2 diabetes mellitus, hypertension presented to ER with abdominal pain for 2-3 days.  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 She reports only a few minutes of relief before the pain returns. However, she does not report vomiting. Her abdominal exam is positive for left upper quadrant tenderness with reduced bowel sounds. The vitals are stable. She is still NPO and on IV fluids. Plan - Continue with Bowel  rest - we can try rechallengin -Hydration with IV fluids for mantaince and replacement at 100cc/hr of normal saline -strict in and outs -Switched to dilaudid  - changed Zofran to Phenergan - she gets headaches from the Zofran   Hypokalemia: K today is 3.2. Yesterday it was 3.5.  Plan  - Replace potassium with KCL -Monitor with Bmet    Elevated anion gap (resolved): She was hypochloremic likely from dehydration that could explain mild anion gap of 15. The AG today is 7 after hydration.  Type 2 diabetes mellitus: A1C on 10/28 is 8.8. Her blood sugars well controlled since admission.  Plan - continue with SSI once she starts to eat  Hypertension: Blood pressure mildly elevated likely secondary to acute distress.   -Continue to monitor her blood pressure.   Thalassemia: H and H satble.  - Continue to monitor.   DVT: heparin    Length of Stay: 1 days   Signed by:  Dow Adolph PGY-I, Internal Medicine Pager [phone] 02/28/2012, 3:02 PM

## 2012-02-28 NOTE — Progress Notes (Signed)
UR complete 

## 2012-02-28 NOTE — Progress Notes (Signed)
Nutrition Brief Note  Patient identified on the Malnutrition Screening Tool (MST) Report  Pt weight obtained via bedscale as 127 lbs with 2 additional pillows in bed.  Per chart review pt's wt typically ranges from 110-120 lbs, which pt confirms.   Current diet order is NPO. Labs and medications reviewed.   Pt admitted with acute-on-chronic pancreatitis for which she takes Creon daily with all meals and snacks.  Pt without apparent wt loss.  Pt states she has received adequate diabetes education in the past and has all the resources she needs at home from previous visits with dietitians.  She reports following a Low Fat diet at home, but overall eats whatever food she can tolerate.   Lab Results  Component Value Date   HGBA1C 8.4* 02/27/2012    No nutrition interventions warranted at this time. If nutrition issues arise, please consult RD.   Loyce Dys, MS RD LDN Clinical Inpatient Dietitian Pager: [phone] Weekend/After hours pager: [phone]

## 2012-02-28 NOTE — H&P (Signed)
Internal Medicine Attending Admission Note Date: 02/28/2012  Patient name: [name] Medical record number: [id number] Date of birth: 1956/03/25 Age: 56 y.o. Gender: female  I saw and evaluated the patient. I reviewed the resident's note and I agree with the resident's findings and plan as documented in the resident's note.  Chief Complaint(s): Abdominal Pain X 3 days.  History - key components related to admission:  [name] is a 56 year old woman with a history of pancreatic divisum status post stenting in 2011 and stent removal in 2012, GERD, diabetes, hypertension, hyperlipidemia, and previous thromboembolism who presents with the acute onset of abdominal pain described as an ache in the left upper quadrant which radiates straight through to the back. She was in her usual state of health until Friday afternoon when this pain suddenly hit her. She spent the weekend trying to hold on to see if it would resolve spontaneously but the pain progressed. She therefore presented to the emergency department for further evaluation. She complains of watery diarrhea and nausea without vomiting. She denies any fevers, shakes, chills, hematemesis, bright red blood per rectum, or abdominal trauma. She has not been around anyone who has been sick. She states these symptoms are similar to the symptoms she's had in the past with previous exacerbations of her pancreatitis. 4 months ago she underwent an EGD which was reportedly unremarkable. Given the concern for an exacerbation of her pancreatic divisum with pancreatitis she was admitted to the teaching service for further care. She is without other complaints at this time.  Physical Exam - key components related to admission:  Filed Vitals:   02/28/12 0536 02/28/12 1005 02/28/12 1025 02/28/12 1405  BP: 91/63 112/60 100/56 123/63  Pulse: 70  72 76  Temp: 98.2 F (36.8 C)  98.2 F (36.8 C) 98.6 F (37 C)  TempSrc: Oral     Resp: 18  16 16   SpO2: 98%   97% 96%   General: Well-developed, well-nourished, woman lying comfortably in bed in no acute distress. Occasionally she becomes tearful and appears to be in distress secondary to abdominal pain. Neck: Supple without cervical or supraclavicular adenopathy. Lungs: Clear to auscultation bilaterally. Heart: Regular rate and rhythm without murmurs, rubs, or gallops. Abdomen: Soft, left upper quadrant tenderness with mild guarding and no rebound. Bowel sounds are active. Extremities: Without edema.  Lab results:  Basic Metabolic Panel:  Basename 02/28/12 1005 02/27/12 1544  NA 143 143  K 3.2* 3.5  CL 110 107  CO2 26 27  GLUCOSE 93 123*  BUN 7 10  CREATININE 0.61 0.61  CALCIUM 8.2* 8.9  MG -- --  PHOS -- --   Liver Function Tests:  Basename 02/28/12 1005 02/27/12 0833  AST 15 32  ALT 10 16  ALKPHOS 84 119*  BILITOT 0.3 0.5  PROT 6.5 9.4*  ALBUMIN 3.1* 4.5    Basename 02/27/12 0833  LIPASE 57  AMYLASE --   CBC:  Basename 02/28/12 1005 02/27/12 1408 02/27/12 0833  WBC 8.0 9.9 --  NEUTROABS -- -- 4.4  HGB 10.0* 13.3 --  HCT 29.9* 38.2 --  MCV 78.3 77.5* --  PLT 335 365 --   CBG:  Basename 02/28/12 1225 02/28/12 0739 02/28/12 0345 02/27/12 2323 02/27/12 2132 02/27/12 2005  GLUCAP 114* 123* 149* 109* 115* 138*   Hemoglobin A1C:  Basename 02/27/12 1408  HGBA1C 8.4*   Urine Drug Screen:  Positive for opiates (received in the ED)  Urinalysis:  Greater than 1000 glucose, moderate leukocytes,  11-12 white blood cells per high-power field, 0-2 red blood cells per high power field, rare bacteria, rare yeast.  Assessment & Plan by Problem:  [name] is a 56 year old woman with a history of pancreatic divisum status post stenting in 2011 and stent removal in 2012 who presents with left upper quadrant and epigastric abdominal pain that radiates straight through the back and is similar to her previous episodes of pancreatitis. Although her lipase is normal her  symptoms are very typical of pancreatitis and she may have a chronically burned out pancreas unable to mount an elevated lipase. Other etiologies in the differential include gastritis, cholelithiasis, peptic ulcer disease, and ischemia. They all seem less likely given her presentation, history and exam.  1) Presumed exacerbation of chronic pancreatitis: [name] will be made n.p.o. and maintained on normal saline until she is able to take by mouth. Her diet will be slowly advanced when her abdominal pain improves. Her pain will be managed with IV dilaudid as needed in order to get control of her abdominal discomfort.  2) Diabetes: Patient's blood sugars have been reasonable despite not taking the metformin because she is n.p.o. We will continue to monitor her blood glucose and initiate an insulin sliding scale if needed. As her diet is advanced we will restart the metformin. If hyperglycemia becomes an issue additional agents may be tried.

## 2012-02-28 NOTE — Progress Notes (Signed)
Results for MIGDALIA, SALEEBY (MRN [id number]) as of 02/28/2012 11:01  Ref. Range 02/27/2012 14:08  Hemoglobin A1C Latest Range: <5.7 % 8.4 (H)    Results for ALLIRA, BUXBAUM (MRN [id number]) as of 02/28/2012 11:01  Ref. Range 02/27/2012 23:23 02/28/2012 03:45 02/28/2012 07:39  Glucose-Capillary Latest Range: 70-99 mg/dL 865 (H) 784 (H) 696 (H)   Patient started on Novolog Moderate correction scale (SSI) Q4 hours yesterday.  CBGs stable today.  Noted A1c above goal.  Patient only takes Metformin at home 1000 mg bid.  PCP with the [facility] Medicine Clinic.  May need the addition of another medication to help get A1c down below 7%.  If patient having issues with postprandial CBGs at home, might consider adding a DPP-4 inhibitor like Januvia or Tradjenta to home regimen (patient does have [employer]).  Will follow. Ambrose Finland RN, MSN, CDE Diabetes Coordinator Inpatient Diabetes Program [phone]

## 2012-02-29 LAB — CBC
MCHC: 32.9 g/dL (ref 30.0–36.0)
MCV: 80.3 fL (ref 78.0–100.0)
Platelets: 320 10*3/uL (ref 150–400)
RDW: 15.1 % (ref 11.5–15.5)
WBC: 11.6 10*3/uL — ABNORMAL HIGH (ref 4.0–10.5)

## 2012-02-29 LAB — GLUCOSE, CAPILLARY
Glucose-Capillary: 101 mg/dL — ABNORMAL HIGH (ref 70–99)
Glucose-Capillary: 105 mg/dL — ABNORMAL HIGH (ref 70–99)
Glucose-Capillary: 132 mg/dL — ABNORMAL HIGH (ref 70–99)
Glucose-Capillary: 153 mg/dL — ABNORMAL HIGH (ref 70–99)

## 2012-02-29 LAB — BASIC METABOLIC PANEL
BUN: 9 mg/dL (ref 6–23)
CO2: 19 mEq/L (ref 19–32)
Calcium: 8.9 mg/dL (ref 8.4–10.5)
Creatinine, Ser: 0.57 mg/dL (ref 0.50–1.10)
GFR calc Af Amer: >90 mL/min (ref >90)

## 2012-02-29 MED ORDER — BIOTENE DRY MOUTH MT LIQD
15.0000 mL | OROMUCOSAL | Status: DC | PRN
  Administered 2012-02-29: 15 mL via OROMUCOSAL

## 2012-02-29 MED ORDER — HYDROMORPHONE HCL PF 1 MG/ML IJ SOLN
1.0000 mg | INTRAMUSCULAR | Status: DC | PRN
  Administered 2012-02-29 – 2012-03-01 (×5): 1 mg via INTRAVENOUS
  Filled 2012-02-29 (×5): qty 1

## 2012-02-29 MED ORDER — HYDROMORPHONE HCL PF 1 MG/ML IJ SOLN: 1.0000 mg | INTRAMUSCULAR | Status: DC | PRN

## 2012-02-29 NOTE — Progress Notes (Signed)
Subjective:    Interval Events:   She reports that he abdominal pain is better today. She was able to sleep last night. She is happy with the improvement. She last had a BM 4 days ago and it was loose X4 and nonbloody.  She still has nausea but no vomiting.     Objective:    Vital Signs:   Temp:  [98 F (36.7 C)-98.7 F (37.1 C)] 98.7 F (37.1 C) (10/30 0609) Pulse Rate:  [72-97] 95  (10/30 0609) Resp:  [16-17] 17  (10/30 0609) BP: (100-134)/(56-75) 134/71 mmHg (10/30 0609) SpO2:  [92 %-97 %] 95 % (10/30 0609) Weight:  [125 lb (56.7 kg)] 125 lb (56.7 kg) (10/29 1100) Last BM Date: 02/26/12   Weights: 24-hour Weight change:   Filed Weights   02/28/12 1100  Weight: 125 lb (56.7 kg)    Intake/Output:   Intake/Output Summary (Last 24 hours) at 02/29/12 0846 Last data filed at 02/28/12 1848  Gross per 24 hour  Intake    800 ml  Output    750 ml  Net     50 ml     Physical Exam: Filed Vitals:   02/29/12 0609  BP: 134/71  Pulse: 95  Temp: 98.7 F (37.1 C)  Resp: 17   Constitutional: She is oriented to person, place, and time. She appears well-developed and well-nourished. She is in no distress today. Head: Normocephalic and atraumatic.  Eyes: Pupils are equal, round, and reactive to light.  Neck: Normal range of motion.  Raspatory: Clear chest bilaterally.  Cardiovascular: Normal rate and intact distal pulses.  Pulmonary/Chest: No respiratory distress.  Abdominal: Normal appearance. She exhibits no distension. She has severe tenderness on palpation of the LUQ without rebound or guarding. The bowel sounds are better today. There are no palpable masses.  Labs: Basic Metabolic Panel:  Lab 02/29/12 1610 02/28/12 1005 02/27/12 1544 02/27/12 1408 02/27/12 0833  NA 139 143 143 -- 135  K 3.7 3.2* 3.5 -- 4.6  CL 103 110 107 -- 95*  CO2 19 26 27  -- 25  GLUCOSE 120* 93 123* -- 364*  BUN 9 7 10  -- 13  CREATININE 0.57 0.61 0.61 0.61 0.62  CALCIUM 8.9 8.2* 8.9 --  --  MG -- -- -- -- --  PHOS -- -- -- -- --    Liver Function Tests:  Lab 02/28/12 1005 02/27/12 0833  AST 15 32  ALT 10 16  ALKPHOS 84 119*  BILITOT 0.3 0.5  PROT 6.5 9.4*  ALBUMIN 3.1* 4.5    Lab 02/27/12 0833  LIPASE 57  AMYLASE --   CBC:  Lab 02/29/12 0525 02/28/12 1005 02/27/12 1408 02/27/12 0833  WBC 11.6* 8.0 9.9 8.5  NEUTROABS -- -- -- 4.4  HGB 11.3* 10.0* 13.3 14.2  HCT 34.3* 29.9* 38.2 40.4  MCV 80.3 78.3 77.5* 77.2*  PLT 320 335 365 391    CBG:  Lab 02/29/12 0802 02/29/12 0355 02/29/12 0003 02/28/12 2008 02/28/12 1752  GLUCAP 101* 132* 104* 92 125*       Medications:    Infusions:    . sodium chloride 100 mL/hr at 02/29/12 0700     Scheduled Medications:    . heparin  5,000 Units Subcutaneous Q8H  . lisinopril  20 mg Oral Daily   And  . hydrochlorothiazide  12.5 mg Oral Daily  . insulin aspart  0-15 Units Subcutaneous Q4H  . lipase/protease/amylase  2 capsule Oral TID WC  . potassium chloride  10 mEq Intravenous Q1 Hr x 2     PRN Medications: acetaminophen, acetaminophen, alum & mag hydroxide-simeth, HYDROmorphone (DILAUDID) injection, lipase/protease/amylase, promethazine, DISCONTD:  morphine injection, DISCONTD: ondansetron (ZOFRAN) IV, DISCONTD: ondansetron   Assessment/ Plan:    56 year old woman with past medical history significant for pancreatic divisum status post stents( placement in 2011 and removal in 07/2010) follows with Dr. Allyson Sabal at [facility], type 2 diabetes mellitus, hypertension presented to ER with abdominal pain for 2-3 days.  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 She slept better last night. However, she does not report vomiting but she continues to have some nausea. Her abdominal exam is positive for left upper quadrant tenderness but the bowel sounds are more audible that before. The vitals are stable. She is still NPO and on IV fluids at  100cc/hr. Plan - we will start on clear liquids today and if she tolerates it, we will advance -Hydration with IV fluids for mantaince and replacement at 100cc/hr of normal saline -continue with dilaudid and Zofran   Hypokalemia: K has improved from 3.2 yesterday to 3.7 after replacement with of KCL. -Monitor with Bmet    Elevated anion gap (resolved): She was hypochloremic likely from dehydration that could explain mild anion gap of 15. The AG closed after hydration.  Type 2 diabetes mellitus: A1C on 10/28 is 8.8. Her blood sugars well controlled since admission.  Home metformin held for now.  Plan - continue with SSI once she starts to eat  Hypertension: Blood pressure mildly elevated likely secondary to acute distress. To continue with her medications. -Continue to monitor her blood pressure.   Thalassemia: H and H satble.  - Continue to monitor.   DVT: heparin    Length of Stay: 2 days   Signed by:  Dow Adolph PGY-I, Internal Medicine Pager [phone] 02/29/2012, 8:46 AM

## 2012-03-01 DIAGNOSIS — E876 Hypokalemia: Secondary | ICD-10-CM

## 2012-03-01 LAB — BASIC METABOLIC PANEL
BUN: 5 mg/dL — ABNORMAL LOW (ref 6–23)
CO2: 22 mEq/L (ref 19–32)
Chloride: 104 mEq/L (ref 96–112)
GFR calc Af Amer: >90 mL/min (ref >90)
Potassium: 3.6 mEq/L (ref 3.5–5.1)

## 2012-03-01 LAB — GLUCOSE, CAPILLARY
Glucose-Capillary: 122 mg/dL — ABNORMAL HIGH (ref 70–99)
Glucose-Capillary: 77 mg/dL (ref 70–99)
Glucose-Capillary: 81 mg/dL (ref 70–99)

## 2012-03-01 LAB — CBC
HCT: 31.8 % — ABNORMAL LOW (ref 36.0–46.0)
Hemoglobin: 10.9 g/dL — ABNORMAL LOW (ref 12.0–15.0)
MCHC: 34.3 g/dL (ref 30.0–36.0)
MCV: 78.5 fL (ref 78.0–100.0)
WBC: 9.7 10*3/uL (ref 4.0–10.5)

## 2012-03-01 MED ORDER — HYDROMORPHONE HCL PF 1 MG/ML IJ SOLN
0.5000 mg | INTRAMUSCULAR | Status: DC | PRN
  Administered 2012-03-01 – 2012-03-02 (×7): 0.5 mg via INTRAVENOUS
  Filled 2012-03-01 (×7): qty 1

## 2012-03-01 MED ORDER — POTASSIUM CHLORIDE 10 MEQ/100ML IV SOLN
10.0000 meq | INTRAVENOUS | Status: AC
  Administered 2012-03-01 (×2): 10 meq via INTRAVENOUS
  Filled 2012-03-01 (×2): qty 100

## 2012-03-01 MED ORDER — POTASSIUM CHLORIDE 10 MEQ/50ML IV SOLN
10.0000 meq | INTRAVENOUS | Status: DC
  Filled 2012-03-01 (×2): qty 50

## 2012-03-01 NOTE — Progress Notes (Signed)
Internal Medicine Teaching Service Attending Note Date: 03/01/2012  Patient name: [name]  Medical record number: [id number]  Date of birth: April 15, 1956    This patient has been seen and discussed with the house staff. Please see their note for complete details. I concur with their findings with the following additions/corrections: Patient's pain is better even with reduced doses of pain medicines. She tolerated the liquid diet well. We will continue to advance diet as tolerated. Patient has pancreatic divisum which causes her to have recurrent pancreatitis and unfortunately this cannot be reversed or taken away.  Lars Mage 03/01/2012, 9:19 PM

## 2012-03-01 NOTE — Progress Notes (Signed)
Subjective:    Interval Events:   She reports that he abdominal pain even better today. She was able to sleep last night. She is happy with the improvement. She continues to complain of nausea but no vomiting. She say that she tolerated half of her clear liquids yesterday and she is happy to try fluid diet today.    Objective:    Vital Signs:   Temp:  [97.3 F (36.3 C)-98.4 F (36.9 C)] 98.4 F (36.9 C) (10/31 0541) Pulse Rate:  [80-92] 92  (10/31 0541) Resp:  [16-17] 17  (10/31 0541) BP: (115-127)/(59-72) 115/59 mmHg (10/31 1036) SpO2:  [96 %-98 %] 98 % (10/31 0541) Last BM Date: 02/26/12   Weights: 24-hour Weight change:   Filed Weights   02/28/12 1100  Weight: 125 lb (56.7 kg)    Intake/Output:   Intake/Output Summary (Last 24 hours) at 03/01/12 1151 Last data filed at 03/01/12 0600  Gross per 24 hour  Intake   1920 ml  Output   1525 ml  Net    395 ml     Physical Exam: Filed Vitals:   03/01/12 1036  BP: 115/59  Pulse:   Temp:   Resp:    Constitutional: She is oriented to person, place, and time. She appears well-developed and well-nourished. She is in no distress today. Head: Normocephalic and atraumatic.  Eyes: Pupils are equal, round, and reactive to light.  Neck: Normal range of motion.  Raspatory: Clear chest bilaterally.  Cardiovascular: Normal rate and intact distal pulses.  Pulmonary/Chest: No respiratory distress.  Abdominal: Normal appearance. She exhibits no distension. She has severe tenderness on palpation of the LUQ without rebound or guarding. The bowel sounds are present. There are no palpable masses.  Labs: Basic Metabolic Panel:  Lab 03/01/12 4098 02/29/12 0525 02/28/12 1005 02/27/12 1544 02/27/12 1408 02/27/12 0833  NA 139 139 143 143 -- 135  K 3.6 3.7 3.2* 3.5 -- 4.6  CL 104 103 110 107 -- 95*  CO2 22 19 26 27  -- 25  GLUCOSE 97 120* 93 123* -- 364*  BUN 5* 9 7 10  -- 13  CREATININE 0.56 0.57 0.61 0.61 0.61 --  CALCIUM 9.0  8.9 8.2* -- -- --  MG -- -- -- -- -- --  PHOS -- -- -- -- -- --    Liver Function Tests:  Lab 02/28/12 1005 02/27/12 0833  AST 15 32  ALT 10 16  ALKPHOS 84 119*  BILITOT 0.3 0.5  PROT 6.5 9.4*  ALBUMIN 3.1* 4.5    Lab 02/27/12 0833  LIPASE 57  AMYLASE --   CBC:  Lab 03/01/12 0550 02/29/12 0525 02/28/12 1005 02/27/12 1408 02/27/12 0833  WBC 9.7 11.6* 8.0 9.9 8.5  NEUTROABS -- -- -- -- 4.4  HGB 10.9* 11.3* 10.0* 13.3 14.2  HCT 31.8* 34.3* 29.9* 38.2 40.4  MCV 78.5 80.3 78.3 77.5* 77.2*  PLT 366 320 335 365 391    CBG:  Lab 03/01/12 0743 03/01/12 0412 02/29/12 2354 02/29/12 2027 02/29/12 1639  GLUCAP 105* 81 153* 155* 105*       Medications:    Infusions:    . sodium chloride 100 mL/hr at 03/01/12 1046     Scheduled Medications:    . heparin  5,000 Units Subcutaneous Q8H  . lisinopril  20 mg Oral Daily   And  . hydrochlorothiazide  12.5 mg Oral Daily  . insulin aspart  0-15 Units Subcutaneous Q4H  . lipase/protease/amylase  2 capsule Oral  TID WC     PRN Medications: acetaminophen, acetaminophen, alum & mag hydroxide-simeth, antiseptic oral rinse, HYDROmorphone (DILAUDID) injection, lipase/protease/amylase, promethazine, DISCONTD:  HYDROmorphone (DILAUDID) injection   Assessment/ Plan:    56 year old woman with past medical history significant for pancreatic divisum status post stents( placement in 2011 and removal in 07/2010) follows with Dr. Allyson Sabal at [facility], type 2 diabetes mellitus, hypertension presented to ER with abdominal pain for 2-3 days.  Presumed Acute Pancreatis 2/2 Pancreatic Divisum: Her pain controlled with Dilaudid 1mg  4Qhr PRN. She slept well night. However, she continues to have some nausea. Her abdominal exam is positive for left upper quadrant tenderness but the bowel sounds are more audible that before. The vitals are stable. She tolerated clear liquids yesterday and she is eager to try full liquids today.  Plan  - we will start  on full liquids today and if she tolerates it, we will advance to soft diet for dinner -Hydration with IV fluids for mantaince and replacement at 100cc/hr of normal saline -continue with dilaudid at a lower of 0.5 mg q4hr and and Zofran  - If she does well after dinner she can discharged tomorrow  - I have set up an appointment with IM clinic for Nov 13  Hypokalemia (resolved): K has improved as shown below but I will replace her with another dose to bump up from 3.6.  Lab 03/01/12 0550 02/29/12 0525 02/28/12 1005 02/27/12 1544 02/27/12 0833  K 3.6 3.7 3.2* 3.5 4.6  Plan - IV with normal saline infusion    Elevated anion gap (resolved): She was hypochloremic likely from dehydration that could explain mild anion gap of 15. The AG closed after hydration.  Type 2 diabetes mellitus: A1C on 10/28 is 8.8. Her blood sugars well controlled since admission.  Home metformin held for now.  Plan - continue with SSI once she starts to eat  Hypertension: Blood pressure mildly elevated likely secondary to acute distress. To continue with her medications. -Continue to monitor her blood pressure.   Thalassemia: H and H satble.  - Continue to monitor.   DVT: heparin    Length of Stay: 3 days   Signed by:  Dow Adolph PGY-I, Internal Medicine Pager [phone] 03/01/2012, 11:51 AM

## 2012-03-02 LAB — BASIC METABOLIC PANEL
BUN: 4 mg/dL — ABNORMAL LOW (ref 6–23)
CO2: 25 mEq/L (ref 19–32)
Calcium: 8.9 mg/dL (ref 8.4–10.5)
GFR calc non Af Amer: >90 mL/min (ref >90)
Glucose, Bld: 117 mg/dL — ABNORMAL HIGH (ref 70–99)
Potassium: 3.6 mEq/L (ref 3.5–5.1)
Sodium: 142 mEq/L (ref 135–145)

## 2012-03-02 LAB — GLUCOSE, CAPILLARY: Glucose-Capillary: 109 mg/dL — ABNORMAL HIGH (ref 70–99)

## 2012-03-02 MED ORDER — OXYCODONE HCL 5 MG PO TABS
5.0000 mg | ORAL_TABLET | ORAL | Status: DC | PRN
Start: 2012-03-02 — End: 2012-03-03
  Administered 2012-03-02 – 2012-03-03 (×6): 5 mg via ORAL
  Filled 2012-03-02 (×6): qty 1

## 2012-03-02 MED ORDER — DOCUSATE SODIUM 100 MG PO CAPS
100.0000 mg | ORAL_CAPSULE | Freq: Two times a day (BID) | ORAL | Status: DC
  Administered 2012-03-02 – 2012-03-03 (×2): 100 mg via ORAL
  Filled 2012-03-02: qty 1

## 2012-03-02 MED ORDER — POTASSIUM CHLORIDE 10 MEQ/100ML IV SOLN
10.0000 meq | INTRAVENOUS | Status: AC
  Administered 2012-03-02: 10 meq via INTRAVENOUS
  Filled 2012-03-02: qty 100

## 2012-03-02 NOTE — Progress Notes (Signed)
Subjective: She had nausea this morning but felt more "comfortable" after taking Zofran. Her LUQ pain is described as "someone stabbing me" with a pain radiating from abdomen to her back. She had a bowel movement last night that was non-bloody. She denies vomiting.   Objective: Vital signs in last 24 hours: Filed Vitals:   03/01/12 1036 03/01/12 1328 03/01/12 2212 03/02/12 0557  BP: 115/59 127/66 119/92 132/71  Pulse:  84 77 71  Temp:  99 F (37.2 C) 97.9 F (36.6 C) 98.4 F (36.9 C)  TempSrc:   Oral Oral  Resp:  18 18 18   Height:      Weight:      SpO2:  98% 99% 98%   Weight change:   Intake/Output Summary (Last 24 hours) at 03/02/12 1549 Last data filed at 03/02/12 1400  Gross per 24 hour  Intake    925 ml  Output   1200 ml  Net   -275 ml   Vitals reviewed. General: resting in bed, in NAD HEENT: PERRL, EOMI, no scleral icterus Cardiac: RRR, no rubs, murmurs or gallops Pulm: clear to auscultation bilaterally, no wheezes, rales, or rhonchi Abd: soft, nondistended, BS present, LUQ tenderness, no splenomegaly Ext: warm and well perfused, no pedal edema Neuro: alert and oriented X3, cranial nerves II-XII grossly intact, strength and sensation to light touch equal in bilateral upper and lower extremities.   Lab Results: Basic Metabolic Panel:  Lab 03/02/12 6213 03/01/12 0550  NA 142 139  K 3.6 3.6  CL 105 104  CO2 25 22  GLUCOSE 117* 97  BUN 4* 5*  CREATININE 0.54 0.56  CALCIUM 8.9 9.0  MG -- --  PHOS -- --   Liver Function Tests:  Lab 02/28/12 1005 02/27/12 0833  AST 15 32  ALT 10 16  ALKPHOS 84 119*  BILITOT 0.3 0.5  PROT 6.5 9.4*  ALBUMIN 3.1* 4.5    Lab 02/27/12 0833  LIPASE 57  AMYLASE --   CBC:  Lab 03/01/12 0550 02/29/12 0525 02/27/12 0833  WBC 9.7 11.6* --  NEUTROABS -- -- 4.4  HGB 10.9* 11.3* --  HCT 31.8* 34.3* --  MCV 78.5 80.3 --  PLT 366 320 --   CBG:  Lab 03/02/12 0740 03/02/12 0328 03/01/12 2348 03/01/12 2004 03/01/12 1622  03/01/12 1206  GLUCAP 142* 109* 77 156* 179* 122*   Hemoglobin A1C:  Lab 02/27/12 1408  HGBA1C 8.4*   Urine Drug Screen: Drugs of Abuse     Component Value Date/Time   LABOPIA POSITIVE* 02/28/2012 1422   LABOPIA NEG 08/30/2011 1526   COCAINSCRNUR NONE DETECTED 02/28/2012 1422   COCAINSCRNUR NEG 08/30/2011 1526   LABBENZ NONE DETECTED 02/28/2012 1422   LABBENZ NEG 08/30/2011 1526   LABBENZ NEG 01/24/2011 1414   AMPHETMU NONE DETECTED 02/28/2012 1422   AMPHETMU NEG 01/24/2011 1414   THCU NONE DETECTED 02/28/2012 1422   LABBARB NONE DETECTED 02/28/2012 1422   LABBARB NEG 08/30/2011 1526    Urinalysis:  Lab 02/27/12 1043  COLORURINE YELLOW  LABSPEC 1.021  PHURINE 5.0  GLUCOSEU >1000*  HGBUR NEGATIVE  BILIRUBINUR NEGATIVE  KETONESUR NEGATIVE  PROTEINUR NEGATIVE  UROBILINOGEN 0.2  NITRITE NEGATIVE  LEUKOCYTESUR MODERATE*    Micro Results: Recent Results (from the past 240 hour(s))  MRSA PCR SCREENING     Status: Normal   Collection Time   02/27/12  1:26 PM      Component Value Range Status Comment   MRSA by PCR NEGATIVE  NEGATIVE  Final    Studies/Results: No results found. Medications: I have reviewed the patient's current medications. Scheduled Meds:   . heparin  5,000 Units Subcutaneous Q8H  . insulin aspart  0-15 Units Subcutaneous Q4H  . lipase/protease/amylase  2 capsule Oral TID WC  . lisinopril  20 mg Oral Daily  . potassium chloride  10 mEq Intravenous Q1 Hr x 2  . DISCONTD: hydrochlorothiazide  12.5 mg Oral Daily   Continuous Infusions:   . sodium chloride 100 mL/hr at 03/02/12 0817   PRN Meds:.acetaminophen, acetaminophen, alum & mag hydroxide-simeth, antiseptic oral rinse, HYDROmorphone (DILAUDID) injection, lipase/protease/amylase, promethazine Assessment/Plan: 56 year old woman with past medical history significant for pancreatic divisum status post stents( placement in 2011 and removal in 07/2010) follows with Dr. Allyson Sabal at [facility], type 2 diabetes  mellitus, hypertension presented to ER with abdominal pain for 2-3 days.   Presumed Acute Pancreatis 2/2 Pancreatic Divisum: Her pain was initially controlled with Dilaudid 1mg  4Qhr PRN.  However, she continues to have some nausea. Her abdominal exam is positive for left upper quadrant tenderness but with no signs of peritonitis. Her vitals are stable. She tolerated full liquids today and would like to advance her diet tomorrow as we titrate her pain medication to PO only.  Plan  - we will continue full liquids today and if she tolerates it, we will advance to soft diet tomorrow.   -Hydration with IV fluids for mantaince and replacement at 100cc/hr of normal saline  -Discontinued IV dilaudid.  -Continue phenergan for nausea -Started Oxycodone 5mg  q4hour PRN for pain -Continue docusate 100mg  BID for bowel prophylaxis -Continue creon  Hypokalemia: K had improved yesterday but low again today at 3.3. Plan  - IV with normal saline infusion   Elevated anion gap (resolved): She was hypochloremic likely from dehydration that could explain mild anion gap of 15. The AG closed after hydration.   Type 2 diabetes mellitus: A1C on 10/28 is 8.8. Her blood sugars well controlled since admission. Home metformin held for now.  Plan  - continue with SSI once she starts to eat   Hypertension: Blood pressure mildly elevated likely secondary to acute distress. Will continue her Lisinopril 20mg  daily.  -Continue to monitor her blood pressure.   Thalassemia: H and H satble.  - Continue to monitor.   DVT: heparin  Dispo. Patient likely to go home tomorrow if her pain is well controlled with PO medications and if she continue to tolerate foods.    LOS: 4 days   Ky Barban 03/02/2012, 3:49 PM

## 2012-03-02 NOTE — Progress Notes (Signed)
Internal Medicine Teaching Service Attending Note Date: 03/02/2012  Patient name: [name]  Medical record number: [id number]  Date of birth: [birth year]    This patient has been seen and discussed with the house staff. Please see their note for complete details.   The following plan was discussed on rounds -   Acute pancreatitis 2/2 pancreatic divisum - Continue soft diet - Switch to PO medications per patient request - If adequately tolerating PO medications today, will discharge to home tomorrow as patient requests.   Disposition - If above tolerated, d/c to home tomorrow.   Gabriele Loveland 03/02/2012, 8:14 PM

## 2012-03-03 DIAGNOSIS — R109 Unspecified abdominal pain: Secondary | ICD-10-CM

## 2012-03-03 DIAGNOSIS — K869 Disease of pancreas, unspecified: Secondary | ICD-10-CM

## 2012-03-03 LAB — BASIC METABOLIC PANEL
CO2: 27 mEq/L (ref 19–32)
Calcium: 8.9 mg/dL (ref 8.4–10.5)
Creatinine, Ser: 0.57 mg/dL (ref 0.50–1.10)
GFR calc non Af Amer: >90 mL/min (ref >90)
Glucose, Bld: 173 mg/dL — ABNORMAL HIGH (ref 70–99)

## 2012-03-03 LAB — CBC
HCT: 31.7 % — ABNORMAL LOW (ref 36.0–46.0)
Hemoglobin: 10.7 g/dL — ABNORMAL LOW (ref 12.0–15.0)
MCH: 26 pg (ref 26.0–34.0)
MCV: 77.1 fL — ABNORMAL LOW (ref 78.0–100.0)
RBC: 4.11 MIL/uL (ref 3.87–5.11)
WBC: 6.6 10*3/uL (ref 4.0–10.5)

## 2012-03-03 LAB — GLUCOSE, CAPILLARY: Glucose-Capillary: 181 mg/dL — ABNORMAL HIGH (ref 70–99)

## 2012-03-03 MED ORDER — POTASSIUM CHLORIDE 20 MEQ/15ML (10%) PO LIQD
20.0000 meq | Freq: Every day | ORAL | Status: DC
  Administered 2012-03-03: 20 meq via ORAL
  Filled 2012-03-03 (×3): qty 15

## 2012-03-03 MED ORDER — METFORMIN HCL 1000 MG PO TABS: 1000.0000 mg | ORAL_TABLET | Freq: Two times a day (BID) | ORAL | Status: DC

## 2012-03-03 MED ORDER — OXYCODONE HCL 5 MG PO TABS: 5.0000 mg | ORAL_TABLET | ORAL | Status: DC | PRN

## 2012-03-03 MED ORDER — POLYETHYLENE GLYCOL 3350 17 GM/SCOOP PO POWD: 17.0000 g | Freq: Every day | ORAL | Status: DC

## 2012-03-03 MED ORDER — PROMETHAZINE HCL 25 MG PO TABS: 25.0000 mg | ORAL_TABLET | Freq: Three times a day (TID) | ORAL | Status: DC | PRN

## 2012-03-03 MED ORDER — DSS 100 MG PO CAPS: 100.0000 mg | ORAL_CAPSULE | Freq: Two times a day (BID) | ORAL | Status: DC

## 2012-03-03 NOTE — Progress Notes (Signed)
Subjective: Her pain is well controlled on PO pain medications and she continues to tolerate a full liquid diet well. Her nausea is well controlled with phenergan. She requests to go home today.   Objective: Vital signs in last 24 hours: Filed Vitals:   03/02/12 0557 03/02/12 1600 03/02/12 2236 03/03/12 0530  BP: 132/71 121/67 121/67 131/68  Pulse: 71 74 72 77  Temp: 98.4 F (36.9 C) 98.3 F (36.8 C) 98.5 F (36.9 C) 98.2 F (36.8 C)  TempSrc: Oral Oral Oral Oral  Resp: 18 16 18 16   Height:      Weight:      SpO2: 98% 99% 97% 100%   Weight change:   Intake/Output Summary (Last 24 hours) at 03/03/12 1159 Last data filed at 03/03/12 1032  Gross per 24 hour  Intake   2725 ml  Output   3200 ml  Net   -475 ml  Vitals reviewed.  General: resting in bed, in NAD  HEENT: PERRL, EOMI, no scleral icterus  Cardiac: RRR, no rubs, murmurs or gallops  Pulm: clear to auscultation bilaterally, no wheezes, rales, or rhonchi  Abd: soft, nondistended, BS present, LUQ tenderness, no splenomegaly  Ext: warm and well perfused, no pedal edema  Neuro: alert and oriented X3, cranial nerves II-XII grossly intact, strength and sensation to light touch equal in bilateral upper and lower extremities.   Lab Results: Basic Metabolic Panel:  Lab 03/03/12 5284 03/02/12 0536  NA 141 142  K 3.3* 3.6  CL 106 105  CO2 27 25  GLUCOSE 173* 117*  BUN <3* 4*  CREATININE 0.57 0.54  CALCIUM 8.9 8.9  MG -- --  PHOS -- --   Liver Function Tests:  Lab 02/28/12 1005 02/27/12 0833  AST 15 32  ALT 10 16  ALKPHOS 84 119*  BILITOT 0.3 0.5  PROT 6.5 9.4*  ALBUMIN 3.1* 4.5    Lab 02/27/12 0833  LIPASE 57  AMYLASE --   CBC:  Lab 03/03/12 0715 03/01/12 0550 02/27/12 0833  WBC 6.6 9.7 --  NEUTROABS -- -- 4.4  HGB 10.7* 10.9* --  HCT 31.7* 31.8* --  MCV 77.1* 78.5 --  PLT 347 366 --    CBG:  Lab 03/03/12 0752 03/03/12 0350 03/03/12 0017 03/02/12 1946 03/02/12 1648 03/02/12 1158  GLUCAP 181* 84  168* 197* 133* 165*   Hemoglobin A1C:  Lab 02/27/12 1408  HGBA1C 8.4*   Urine Drug Screen: Drugs of Abuse     Component Value Date/Time   LABOPIA POSITIVE* 02/28/2012 1422   LABOPIA NEG 08/30/2011 1526   COCAINSCRNUR NONE DETECTED 02/28/2012 1422   COCAINSCRNUR NEG 08/30/2011 1526   LABBENZ NONE DETECTED 02/28/2012 1422   LABBENZ NEG 08/30/2011 1526   LABBENZ NEG 01/24/2011 1414   AMPHETMU NONE DETECTED 02/28/2012 1422   AMPHETMU NEG 01/24/2011 1414   THCU NONE DETECTED 02/28/2012 1422   LABBARB NONE DETECTED 02/28/2012 1422   LABBARB NEG 08/30/2011 1526    Urinalysis:  Lab 02/27/12 1043  COLORURINE YELLOW  LABSPEC 1.021  PHURINE 5.0  GLUCOSEU >1000*  HGBUR NEGATIVE  BILIRUBINUR NEGATIVE  KETONESUR NEGATIVE  PROTEINUR NEGATIVE  UROBILINOGEN 0.2  NITRITE NEGATIVE  LEUKOCYTESUR MODERATE*   Micro Results: Recent Results (from the past 240 hour(s))  MRSA PCR SCREENING     Status: Normal   Collection Time   02/27/12  1:26 PM      Component Value Range Status Comment   MRSA by PCR NEGATIVE  NEGATIVE Final  Studies/Results: No results found. Medications: I have reviewed the patient's current medications. Scheduled Meds:   . docusate sodium  100 mg Oral BID  . heparin  5,000 Units Subcutaneous Q8H  . insulin aspart  0-15 Units Subcutaneous Q4H  . lipase/protease/amylase  2 capsule Oral TID WC  . lisinopril  20 mg Oral Daily  . potassium chloride  10 mEq Intravenous Q1 Hr x 2   Continuous Infusions:   . sodium chloride 100 mL/hr at 03/03/12 0546   PRN Meds:.acetaminophen, acetaminophen, alum & mag hydroxide-simeth, antiseptic oral rinse, lipase/protease/amylase, oxyCODONE, promethazine, DISCONTD:  HYDROmorphone (DILAUDID) injection Assessment/Plan: 56 year old woman with past medical history significant for pancreatic divisum status post stents( placement in 2011 and removal in 07/2010) follows with Dr. Allyson Sabal at [facility], type 2 diabetes mellitus, hypertension  presented to ER with abdominal pain for 2-3 days.   Presumed Acute Pancreatis 2/2 Pancreatic Divisum: Her pain was initially controlled with Dilaudid 1mg  q4hr PRN for pain. The dose of Dilaudid was decreased to 0.5mg  q4hr PRN and she continued to have good pain control. Dilaudid was discontinued yesterday and she was started on oxycodone 5mg  PO q4hr PRN for pain with good pain control. Her nausea was persistent at first, but it is responding to phenergan. She has no nausea today.  Her abdominal exam is positive for left upper quadrant tenderness but with no signs of peritonitis. Her vitals are stable. She has tolerated full liquids for the past two days and would like to advance her diet slowly once she is discharged home.   Plan  - we will continue full liquids today and if she tolerates it, we will advance to soft diet.  -Hydration with IV fluids for mantaince and replacement at 100cc/hr of normal saline  -Discontinued IV dilaudid.  -Continue phenergan for nausea  -Started Oxycodone 5mg  q4hour PRN for pain  -Continue docusate 100mg  BID for bowel prophylaxis  -Will consider adding Miralax for bowel prophylaxis.  -Continue creon   Hypokalemia: K had improved initially after repletion but low again yesterday and today at 3.3.  Plan  - Oral, liquid  Elevated anion gap (resolved): She was hypochloremic likely from dehydration that could explain mild anion gap of 15. The AG closed after hydration.   Type 2 diabetes mellitus: A1C on 10/28 is 8.8. Her blood sugars well controlled since admission. Home metformin held for now.  Plan  - continue with SSI once she starts to eat regular diet - Patient needs refill of metformin upon her discharge  Hypertension: Blood pressure initially mildly elevated likely secondary to acute distress but stable now.  Will continue her Lisinopril 20mg  daily.  -Continue to monitor her blood pressure.   Thalassemia: H and H satble.  - Continue to monitor.    DVT: heparin  Dispo. Patient likely to go home today if her pain continues to be well  controlled with PO medications and if she continues to tolerate foods.      LOS: 5 days   Ky Barban 03/03/2012, 11:59 AM

## 2012-03-03 NOTE — Progress Notes (Signed)
Patient discharged to home in care of spouse. Medications and instructions reviewed with patient and spouse with no questions. IV d/c'd with cath intact. Assessment unchanged from this am. Patient is to follow up with [facility] for blood work.

## 2012-03-12 ENCOUNTER — Ambulatory Visit (INDEPENDENT_AMBULATORY_CARE_PROVIDER_SITE_OTHER): Payer: BC Managed Care – PPO | Admitting: Internal Medicine

## 2012-03-12 ENCOUNTER — Encounter (HOSPITAL_COMMUNITY): Payer: Self-pay | Admitting: *Deleted

## 2012-03-12 ENCOUNTER — Encounter: Payer: Self-pay | Admitting: Internal Medicine

## 2012-03-12 ENCOUNTER — Inpatient Hospital Stay (HOSPITAL_COMMUNITY)
Admission: AD | Admit: 2012-03-12 | Discharge: 2012-03-18 | DRG: 204 | Disposition: A | Discharge status: Home / Self Care | Payer: BC Managed Care – PPO | Source: Ambulatory Visit | Attending: Internal Medicine | Admitting: Internal Medicine

## 2012-03-12 ENCOUNTER — Telehealth: Payer: Self-pay | Admitting: *Deleted

## 2012-03-12 VITALS — BP 147/98 | HR 107 | Temp 98.6°F | Ht 60.0 in | Wt 119.3 lb

## 2012-03-12 DIAGNOSIS — Z7901 Long term (current) use of anticoagulants: Secondary | ICD-10-CM

## 2012-03-12 DIAGNOSIS — Z91013 Allergy to seafood: Secondary | ICD-10-CM

## 2012-03-12 DIAGNOSIS — K859 Acute pancreatitis without necrosis or infection, unspecified: Principal | ICD-10-CM | POA: Diagnosis present

## 2012-03-12 DIAGNOSIS — Z87891 Personal history of nicotine dependence: Secondary | ICD-10-CM

## 2012-03-12 DIAGNOSIS — E876 Hypokalemia: Secondary | ICD-10-CM | POA: Diagnosis present

## 2012-03-12 DIAGNOSIS — D509 Iron deficiency anemia, unspecified: Secondary | ICD-10-CM

## 2012-03-12 DIAGNOSIS — Z86711 Personal history of pulmonary embolism: Secondary | ICD-10-CM

## 2012-03-12 DIAGNOSIS — Z8673 Personal history of transient ischemic attack (TIA), and cerebral infarction without residual deficits: Secondary | ICD-10-CM

## 2012-03-12 DIAGNOSIS — G8929 Other chronic pain: Secondary | ICD-10-CM

## 2012-03-12 DIAGNOSIS — R1012 Left upper quadrant pain: Secondary | ICD-10-CM

## 2012-03-12 DIAGNOSIS — D573 Sickle-cell trait: Secondary | ICD-10-CM | POA: Diagnosis present

## 2012-03-12 DIAGNOSIS — Z794 Long term (current) use of insulin: Secondary | ICD-10-CM

## 2012-03-12 DIAGNOSIS — E1169 Type 2 diabetes mellitus with other specified complication: Secondary | ICD-10-CM | POA: Diagnosis present

## 2012-03-12 DIAGNOSIS — I10 Essential (primary) hypertension: Secondary | ICD-10-CM | POA: Diagnosis present

## 2012-03-12 DIAGNOSIS — E785 Hyperlipidemia, unspecified: Secondary | ICD-10-CM | POA: Diagnosis present

## 2012-03-12 DIAGNOSIS — R109 Unspecified abdominal pain: Secondary | ICD-10-CM

## 2012-03-12 DIAGNOSIS — Z90710 Acquired absence of both cervix and uterus: Secondary | ICD-10-CM

## 2012-03-12 DIAGNOSIS — Q453 Other congenital malformations of pancreas and pancreatic duct: Secondary | ICD-10-CM

## 2012-03-12 DIAGNOSIS — K861 Other chronic pancreatitis: Secondary | ICD-10-CM | POA: Diagnosis present

## 2012-03-12 DIAGNOSIS — E1165 Type 2 diabetes mellitus with hyperglycemia: Secondary | ICD-10-CM | POA: Diagnosis present

## 2012-03-12 DIAGNOSIS — Z809 Family history of malignant neoplasm, unspecified: Secondary | ICD-10-CM

## 2012-03-12 DIAGNOSIS — E119 Type 2 diabetes mellitus without complications: Secondary | ICD-10-CM | POA: Diagnosis present

## 2012-03-12 DIAGNOSIS — Z882 Allergy status to sulfonamides status: Secondary | ICD-10-CM

## 2012-03-12 DIAGNOSIS — Z23 Encounter for immunization: Secondary | ICD-10-CM

## 2012-03-12 DIAGNOSIS — R197 Diarrhea, unspecified: Secondary | ICD-10-CM

## 2012-03-12 DIAGNOSIS — Z8542 Personal history of malignant neoplasm of other parts of uterus: Secondary | ICD-10-CM

## 2012-03-12 LAB — GLUCOSE, CAPILLARY
Glucose-Capillary: 166 mg/dL — ABNORMAL HIGH (ref 70–99)
Glucose-Capillary: 137 mg/dL — ABNORMAL HIGH (ref 70–99)

## 2012-03-12 LAB — COMPREHENSIVE METABOLIC PANEL
AST: 16 U/L (ref 0–37)
Alkaline Phosphatase: 91 U/L (ref 39–117)
BUN: 11 mg/dL (ref 6–23)
Creat: 0.6 mg/dL (ref 0.50–1.10)

## 2012-03-12 LAB — CBC WITH DIFFERENTIAL/PLATELET
Basophils Absolute: 0 10*3/uL (ref 0.0–0.1)
Eosinophils Absolute: 0.1 10*3/uL (ref 0.0–0.7)
Eosinophils Relative: 1 % (ref 0–5)
Lymphocytes Relative: 41 % (ref 12–46)
MCV: 77.4 fL — ABNORMAL LOW (ref 78.0–100.0)
Platelets: 454 10*3/uL — ABNORMAL HIGH (ref 150–400)
RDW: 14.5 % (ref 11.5–15.5)
WBC: 10.4 10*3/uL (ref 4.0–10.5)

## 2012-03-12 LAB — BASIC METABOLIC PANEL
BUN: 9 mg/dL (ref 6–23)
Calcium: 9.9 mg/dL (ref 8.4–10.5)
Creatinine, Ser: 0.54 mg/dL (ref 0.50–1.10)
GFR calc Af Amer: >90 mL/min (ref >90)

## 2012-03-12 LAB — URINALYSIS, ROUTINE W REFLEX MICROSCOPIC
Hgb urine dipstick: NEGATIVE
Ketones, ur: NEGATIVE mg/dL
Nitrite: NEGATIVE
Urobilinogen, UA: 0.2 mg/dL (ref 0.0–1.0)

## 2012-03-12 LAB — LACTIC ACID, PLASMA: Lactic Acid, Venous: 1.2 mmol/L (ref 0.5–2.2)

## 2012-03-12 MED ORDER — PROMETHAZINE HCL 25 MG PO TABS
12.5000 mg | ORAL_TABLET | Freq: Four times a day (QID) | ORAL | Status: DC | PRN
  Administered 2012-03-12 – 2012-03-18 (×16): 12.5 mg via ORAL
  Filled 2012-03-12 (×15): qty 1

## 2012-03-12 MED ORDER — POTASSIUM CHLORIDE 10 MEQ/100ML IV SOLN
10.0000 meq | INTRAVENOUS | Status: DC
  Administered 2012-03-13: 10 meq via INTRAVENOUS
  Filled 2012-03-12 (×4): qty 100

## 2012-03-12 MED ORDER — HYDROCHLOROTHIAZIDE 12.5 MG PO CAPS
12.5000 mg | ORAL_CAPSULE | Freq: Every day | ORAL | Status: DC
  Administered 2012-03-12 – 2012-03-18 (×7): 12.5 mg via ORAL
  Filled 2012-03-12 (×7): qty 1

## 2012-03-12 MED ORDER — PNEUMOCOCCAL VAC POLYVALENT 25 MCG/0.5ML IJ INJ
0.5000 mL | INJECTION | INTRAMUSCULAR | Status: AC
  Filled 2012-03-12: qty 0.5

## 2012-03-12 MED ORDER — OXYCODONE HCL 5 MG PO TABS
5.0000 mg | ORAL_TABLET | ORAL | Status: DC | PRN
  Administered 2012-03-12 – 2012-03-18 (×18): 5 mg via ORAL
  Filled 2012-03-12 (×19): qty 1

## 2012-03-12 MED ORDER — LISINOPRIL 20 MG PO TABS
20.0000 mg | ORAL_TABLET | Freq: Every day | ORAL | Status: DC
  Administered 2012-03-12 – 2012-03-18 (×7): 20 mg via ORAL
  Filled 2012-03-12 (×7): qty 1

## 2012-03-12 MED ORDER — ENOXAPARIN SODIUM 40 MG/0.4ML ~~LOC~~ SOLN
40.0000 mg | SUBCUTANEOUS | Status: DC
  Administered 2012-03-13 – 2012-03-17 (×5): 40 mg via SUBCUTANEOUS
  Filled 2012-03-12 (×8): qty 0.4

## 2012-03-12 MED ORDER — HYDROMORPHONE HCL PF 1 MG/ML IJ SOLN
0.5000 mg | INTRAMUSCULAR | Status: DC | PRN
  Administered 2012-03-12 – 2012-03-17 (×25): 0.5 mg via INTRAVENOUS
  Filled 2012-03-12 (×25): qty 1

## 2012-03-12 MED ORDER — PANTOPRAZOLE SODIUM 40 MG IV SOLR
40.0000 mg | INTRAVENOUS | Status: DC
  Administered 2012-03-12 – 2012-03-16 (×5): 40 mg via INTRAVENOUS
  Filled 2012-03-12 (×6): qty 40

## 2012-03-12 MED ORDER — POTASSIUM CHLORIDE 10 MEQ/100ML IV SOLN
10.0000 meq | INTRAVENOUS | Status: AC
  Administered 2012-03-12 (×3): 10 meq via INTRAVENOUS
  Filled 2012-03-12 (×4): qty 100

## 2012-03-12 MED ORDER — PROMETHAZINE HCL 25 MG RE SUPP: 25.0000 mg | Freq: Four times a day (QID) | RECTAL | Status: DC | PRN

## 2012-03-12 MED ORDER — LISINOPRIL-HYDROCHLOROTHIAZIDE 20-12.5 MG PO TABS: 1.0000 | ORAL_TABLET | Freq: Every day | ORAL | Status: DC

## 2012-03-12 MED ORDER — SODIUM CHLORIDE 0.9 % IV SOLN
INTRAVENOUS | Status: DC
  Administered 2012-03-12 – 2012-03-13 (×3): via INTRAVENOUS
  Administered 2012-03-14: 1000 mL via INTRAVENOUS
  Administered 2012-03-14: 11:00:00 via INTRAVENOUS
  Administered 2012-03-15: 125 mL/h via INTRAVENOUS

## 2012-03-12 MED ORDER — LACTATED RINGERS IV BOLUS (SEPSIS): 1000.0000 mL | Freq: Once | INTRAVENOUS | Status: DC

## 2012-03-12 MED ORDER — INSULIN ASPART 100 UNIT/ML ~~LOC~~ SOLN
0.0000 [IU] | SUBCUTANEOUS | Status: DC
  Administered 2012-03-12 – 2012-03-13 (×3): 1 [IU] via SUBCUTANEOUS
  Administered 2012-03-13: 2 [IU] via SUBCUTANEOUS
  Administered 2012-03-14: 1 [IU] via SUBCUTANEOUS
  Administered 2012-03-14 (×2): 2 [IU] via SUBCUTANEOUS
  Administered 2012-03-15 (×2): 1 [IU] via SUBCUTANEOUS
  Administered 2012-03-15: 2 [IU] via SUBCUTANEOUS
  Administered 2012-03-15: 5 [IU] via SUBCUTANEOUS
  Administered 2012-03-16: 1 [IU] via SUBCUTANEOUS
  Administered 2012-03-16: 3 [IU] via SUBCUTANEOUS
  Administered 2012-03-16 (×3): 1 [IU] via SUBCUTANEOUS
  Administered 2012-03-17 (×2): 2 [IU] via SUBCUTANEOUS
  Administered 2012-03-17: 3 [IU] via SUBCUTANEOUS
  Administered 2012-03-17: 5 [IU] via SUBCUTANEOUS
  Administered 2012-03-18 (×3): 2 [IU] via SUBCUTANEOUS

## 2012-03-12 MED ORDER — SODIUM CHLORIDE 0.9 % IV SOLN: 1000.0000 mL | Freq: Once | INTRAVENOUS | Status: DC

## 2012-03-12 NOTE — H&P (Signed)
Hospital Admission Note Date: 03/12/2012  Patient name: [name] Medical record number: [id number] Date of birth: [birth year] Age: 56 y.o. Gender: female PCP: Johnette Abraham, DO  Internal Medicine Teaching Service  Attending physician:  Dr. Criselda Peaches   Chief Complaint: abdominal pain, nausea, diarrhea  History of Present Illness:  [name] is a 56 yo female with a history of pancreas divisum s/p stenting, type 2 DM, HTN, recently discharged from the teaching service for abdominal pain/N, who presents as a direct admission from clinic with worsening abdominal pain, diarrhea, and nausea. She states that she had felt better after discharge and was starting to resume regular eating, when she began to feel lousy over the weekend. She experience "stabbing" LUQ pain radiating to the back ("20 out of 10") and nausea as well as diarrhea. She had had loose bowel movements up to 6 times per day. She also remembers seeing blood in the toilet bowel after a BM in the past few days.  She denies fever, chills, SOB, chest pain, dysuria, vomiting.  Meds:   Medication List     As of 03/12/2012 11:27 PM    ASK your doctor about these medications         calcium carbonate 500 MG chewable tablet   Commonly known as: TUMS - dosed in mg elemental calcium   Chew 2 tablets by mouth every 6 (six) hours as needed. For upset stomach      lipase/protease/amylase 78295 UNITS Cpep   Commonly known as: CREON-10/PANCREASE   Take 1-2 capsules by mouth 4 (four) times daily. Take 2 capsules by mouth with meals, and 1 capsule by mouth with snacks.      lisinopril-hydrochlorothiazide 20-12.5 MG per tablet   Commonly known as: PRINZIDE,ZESTORETIC   Take 1 tablet by mouth at bedtime.      loperamide 2 MG capsule   Commonly known as: IMODIUM   Take 2 mg by mouth 4 (four) times daily as needed. For diarrhea      metFORMIN 1000 MG tablet   Commonly known as: GLUCOPHAGE   Take 1,000 mg by mouth 2 (two)  times daily with a meal.      promethazine 25 MG tablet   Commonly known as: PHENERGAN   Take 25 mg by mouth every 8 (eight) hours as needed. For nausea/vomiting         Allergies: Allergies as of 03/12/2012 - Review Complete 03/12/2012  Allergen Reaction Noted  . Fioricet (butalbital-apap-caffeine) Other (See Comments)   . Shrimp flavor Anaphylaxis 08/25/2010  . Sulfonamide derivatives Rash 07/13/2006   Past Medical History  Diagnosis Date  . Hypertension   . HLD (hyperlipidemia) 2009  . Sickle cell trait   . Pulmonary embolus 08/2004    Two areas of V/Q mismatch. Findings compatible  with high probability for pulmonary embolus.; pt was on coumadin for 1 year  . Pancreatic divisum     S/P ERCP with stenting 07/2010 at [facility], then stent removal (08/05/2010)  . CVA (cerebral infarction) 1993    Per report by patient she had a stroke and prolonged rehab course eventually resolving her right sided weakness, as per her hx obtained 08/2007  . Blood dyscrasia     thalasemia, sickle cell trait  . Chronic abdominal pain     Unclear etiology, thought to be due to chronic pancreatitis previously in setting of her pancreatic divisum - however, EUS and EGD performed at [facility]  (10/05/2011) showing normal esophageall, gastric, duodenal mucosa. EUS showing  no pancreatic masses, cysts, or changes of chronic pancreatitis. Biliary system nondilated and had no endosonographic abnormalities.  . Type II diabetes mellitus 2010    well controlled  . Asthma     childhood asthma  . Pneumonia     "when I was a kid" (02/27/2012)  . Beta thalassemia   . Migraine headache   . Seizures 1993    "first year after my stroke" (02/27/2012)  . Stroke 1993    very small amount of left sided weakness  . Uterine cancer    Past Surgical History  Procedure Date  . Pancreatic stent placement/removal     placed in 2011; removed in 2012/H&P (02/27/2012)  . Abdominal hysterectomy 1980    2/2  endometriosis  . Breast lumpectomy 1980's    bilaterally; "in my milk ducts; both benign" (02/27/2012)   Family History  Problem Relation Age of Onset  . Prostate cancer Father   . Cancer Father     stomach ca died x 1 year ago  . Sickle cell anemia Brother   . Lung cancer Maternal Aunt   . Lung cancer Maternal Uncle   . Breast cancer Maternal Grandmother   . Heart disease Maternal Grandfather   . Heart disease Paternal Grandfather   . Diabetes Other   . Cancer Other     multiple cancers pancreas,colon, breast   History   Social History  . Marital Status: Married    Spouse Name: N/A    Number of Children: N/A  . Years of Education: N/A   Occupational History  . Not on file.   Social History Main Topics  . Smoking status: Former Smoker -- 0.5 packs/day for 30 years    Types: Cigarettes    Quit date: 01/01/1999  . Smokeless tobacco: Never Used  . Alcohol Use: No  . Drug Use: No  . Sexually Active: Not on file   Other Topics Concern  . Not on file   Social History Narrative   Works at [employer] as a Merchandiser, retail, on Health visitor about 9 hours/ day    Review of Systems: Pertinent items noted in HPI   Physical Exam Blood pressure 108/75, pulse 89, temperature 98.3 F (36.8 C), temperature source Oral, resp. rate 16, height 5' (1.524 m), weight 116 lb 1.6 oz (52.663 kg), SpO2 96.00%. General: laying on her right side, appears uncomfortable HEENT:  PERRL, EOMI, no lymphadenopathy, slightly dry mucous membranes Cardiovascular:  Regular rate and rhythm, no murmurs, rubs or gallops Respiratory:  Clear to auscultation bilaterally, no wheezes, rales, or rhonchi Abdomen:  Soft, nondistended, acutely tender to palpation in LUQ, no rebound or guarding Extremities:  Warm and well-perfused, no clubbing, cyanosis, or edema.  Skin: Warm, dry, no rashes Neuro: Not anxious appearing, no depressed mood, normal affect  Lab results: Basic Metabolic Panel:  Basename 03/12/12 2030  03/12/12 1448  NA 137 137  K 3.2* 3.3*  CL 99 98  CO2 24 23  GLUCOSE 149* 141*  BUN 9 11  CREATININE 0.54 0.60  CALCIUM 9.9 10.2  MG 1.6 --  PHOS -- --   Liver Function Tests:  Basename 03/12/12 1448  AST 16  ALT 13  ALKPHOS 91  BILITOT 0.4  PROT 8.7*  ALBUMIN 4.1    Basename 03/12/12 1448  LIPASE 87*  AMYLASE --   CBC:  Basename 03/12/12 1448  WBC 10.4  NEUTROABS 5.0  HGB 13.3  HCT 38.7  MCV 77.4*  PLT 454*   CBG:  Basename 03/12/12 2006 03/12/12 1821 03/12/12 1417  GLUCAP 143* 137* 166*   Urinalysis:  Basename 03/12/12 1436  COLORURINE YELLOW  LABSPEC 1.012  PHURINE 6.0  GLUCOSEU 250*  HGBUR NEG  BILIRUBINUR NEG  KETONESUR NEG  PROTEINUR NEG  UROBILINOGEN 0.2  NITRITE NEG  LEUKOCYTESUR NEG    Assessment & Plan by Problem:  [name] is a 56 yo female with a history of pancreas divisum s/p stenting, type 2 DM, HTN, recently discharged from the teaching service for abdominal pain/N, who presents as a direct admission from clinic with worsening abdominal pain, diarrhea, and nausea.   #. Acute on chronic abdominal pain: Patient's abdominal pain is most likely due to flare up of pancreatitis. Patient was just discharged from the hospital after being treated for a similar problem. Patient has a known history of pancreatic divisum status post stents (in 2011 and removal in 2012). She presents today to the clinic with an episode of similar severe left upper quadrant pain for 3 days associated with frequent loose, watery stools. Lipase is elevated to 87 from 57 on 02/27/2012, which is consistent with a slight flare in pancreatitis. The etiology for her acute on chronic exacerbation is not clear. Other differentials peptic ulcer disease versus gastritis.  Patient was just discharged from hospital due to similar problems early this month.  She recently underwent EUS and EGD on 10/05/2011 reviewed normal esophageal and duodenal mucosa with no masses, cysts or  changes, indicating that gastritis is less likely.  -Admit to MedSurg bed.  -Bowel rest  -Hydration with IV fluids  -Morphine for pain  -Phenergan for nausea.  -Pt may benefit from a f/u appt with her GI specialist at [facility]  #  Elevated anion gap: AG of 18 on admission. She does not seem to have typical DKA, since her bicarbonate is 23 with CBG of 141 and normal-low potassium. Patient has been on metformin at home for her diabetes. Due to severe nausea vomiting and diarrhea, patient may have metformin-induced lactic acidosis. Her albumin level is normal at 4.1 today, ruling out the possibility of paraprotein related AG elevation. She denies any history of intoxication or ingestions.  -IV fluid: NS at 150/h.  -Followup UA to look for ketones  -Recheck BMET   #: Hypokalemia: Potassium is 3.3. It is most likely due to vomiting and diarrhea.  -will replete K -Repeat BMP -check Mg level  #  Type 2 diabetes mellitus: Last A1c was 8.4 on 02/27/12. She has been taking metformin at home. - Will d/c metformin - Start Sliding scale insulin   #. Hypertension: Blood pressure mildly elevated likely secondary to acute distress.  -Continue to home Prinzide.  # DVT PPx: lovenox.    Signed: Denton Ar 03/12/2012, 11:27 PM

## 2012-03-12 NOTE — Patient Instructions (Signed)
You will be admitted today for further treatment.

## 2012-03-12 NOTE — Progress Notes (Signed)
Patient ID: [name], female   DOB: [birth year], 56 y.o.   MRN: [id number]  Subjective:   Patient ID: [name] female   DOB: 1956/04/23 56 y.o.   MRN: [id number]  HPI: [name] is a 56 y.o. pancreatic divisum, hyperlipidemia, recurrent abdominal pain, of unclear etiology, type 2 diabetes, and asthma, presents today with severe abdominal pain for the last 5 days. She was discharged from the hospital 10 days ago for presumed acute on chronic pancreatitis. She had a scheduled appointed for 03/15/2012 but she called today and requested to be evaluated sooner due to severe pain.  She reports that when she arrived home she continued feeling well until 3 days ago when she developed severe abdominal pain associated with diarrhea, without vomiting but feeling very nauseous. The pain has been progressively becoming worse.  She's been passing about 7 loose motions that is yellow in color, however, she noticed a red tinge in her stool when she wiped herself. She has had 5 loose motions today.  As of the abdominal, it is mainly located on the left upper quadrant of her abdomen and radiates to the back. It is constant and very severe to the scale of 10 out of 10. It feels like she is being stabbed by a sharp knife through the left side of her abdomen to the back. The pain was increased yesterday after she has some dinner. She's been very afraid to eat anything because of abdominal pain and also everything including water 'just goes straight through' as diarrhea. She reports that this episode feels like her usual attacks and she is frustrated because they seem to happen more frequently than in the past. Prior to her previous episode for which she was admitted 3 weeks ago, she had enjoyed a 9 months pain free period. Today's pain feels the same. She was discharged with 20 tables of Oxycodone 5 mg q4hr PRN for pain. She ran out of it on Friday and the severe pain started on Saturday (3 days ago). She  reports feeling very thirsty since 3 days ago. She denies history of fever or chills.She has not had any vomiting. She denies symptoms of dizziness or palpitations or passing out. No one else at home has similar symptoms. She denies sick contacts. There is no recent change in her diet.     Past Medical History  Diagnosis Date  . Hypertension   . HLD (hyperlipidemia) 2009  . Sickle cell trait   . Pulmonary embolus 08/2004    Two areas of V/Q mismatch. Findings compatible  with high probability for pulmonary embolus.; pt was on coumadin for 1 year  . Pancreatic divisum     S/P ERCP with stenting 07/2010 at [facility], then stent removal (08/05/2010)  . CVA (cerebral infarction) 1993    Per report by patient she had a stroke and prolonged rehab course eventually resolving her right sided weakness, as per her hx obtained 08/2007  . Blood dyscrasia     thalasemia, sickle cell trait  . Chronic abdominal pain     Unclear etiology, thought to be due to chronic pancreatitis previously in setting of her pancreatic divisum - however, EUS and EGD performed at [facility]  (10/05/2011) showing normal esophageall, gastric, duodenal mucosa. EUS showing no pancreatic masses, cysts, or changes of chronic pancreatitis. Biliary system nondilated and had no endosonographic abnormalities.  . Type II diabetes mellitus 2010    well controlled  . Asthma     childhood asthma  .  Pneumonia     "when I was a kid" (02/27/2012)  . Beta thalassemia   . Migraine headache   . Seizures 1993    "first year after my stroke" (02/27/2012)  . Stroke 1993    very small amount of left sided weakness  . Uterine cancer    Current Outpatient Prescriptions  Medication Sig Dispense Refill  . calcium carbonate (TUMS - DOSED IN MG ELEMENTAL CALCIUM) 500 MG chewable tablet Chew 1 tablet by mouth 2 (two) times daily as needed. Indigestion      . docusate sodium 100 MG CAPS Take 100 mg by mouth 2 (two) times daily.  10 capsule    .  Ibuprofen-Diphenhydramine Cit (MOTRIN PM) 200-38 MG TABS Take 1 tablet by mouth at bedtime as needed. For pain/sleep      . lipase/protease/amylase (CREON-10/PANCREASE) 12000 UNITS CPEP Take 1-2 capsules by mouth 4 (four) times daily. 2 capsules with meals (breakfast, lunch and dinner) 1 capsule with snack      . lisinopril-hydrochlorothiazide (PRINZIDE,ZESTORETIC) 20-12.5 MG per tablet Take 1 tablet by mouth daily.      . Loperamide HCl (IMODIUM PO) Take 1 tablet by mouth as needed. After each loose stool      . metFORMIN (GLUCOPHAGE) 1000 MG tablet Take 1 tablet (1,000 mg total) by mouth 2 (two) times daily with a meal.  60 tablet  3  . oxyCODONE (OXY IR/ROXICODONE) 5 MG immediate release tablet Take 1 tablet (5 mg total) by mouth every 4 (four) hours as needed for pain.  20 tablet  0  . polyethylene glycol powder (GLYCOLAX/MIRALAX) powder Take 17 g by mouth daily.  255 g  0  . promethazine (PHENERGAN) 25 MG tablet Take 1 tablet (25 mg total) by mouth every 8 (eight) hours as needed. For nausea  20 tablet  0   Family History  Problem Relation Age of Onset  . Prostate cancer Father   . Cancer Father     stomach ca died x 1 year ago  . Sickle cell anemia Brother   . Lung cancer Maternal Aunt   . Lung cancer Maternal Uncle   . Breast cancer Maternal Grandmother   . Heart disease Maternal Grandfather   . Heart disease Paternal Grandfather   . Diabetes Other   . Cancer Other     multiple cancers pancreas,colon, breast   History   Social History  . Marital Status: Married    Spouse Name: N/A    Number of Children: N/A  . Years of Education: N/A   Social History Main Topics  . Smoking status: Former Smoker -- 0.5 packs/day for 30 years    Types: Cigarettes    Quit date: 01/01/1999  . Smokeless tobacco: Never Used  . Alcohol Use: No  . Drug Use: No  . Sexually Active: None   Other Topics Concern  . None   Social History Narrative   Works at [employer] as a Merchandiser, retail, on  Health visitor about 9 hours/ day   Review of Systems: Review of Systems  Constitutional: Positive for malaise/fatigue. Negative for fever, chills, weight loss and diaphoresis.  HENT: Negative for hearing loss, ear pain, nosebleeds, congestion, sore throat, tinnitus and ear discharge.   Eyes: Negative.   Respiratory: Negative for cough, hemoptysis, sputum production, shortness of breath, wheezing and stridor.   Cardiovascular: Negative.   Genitourinary: Negative.   Musculoskeletal: Negative.   Skin: Negative for itching and rash.  Neurological: Positive for weakness. Negative for headaches.  Psychiatric/Behavioral: Negative.    Objective:  Physical Exam: Filed Vitals:   03/12/12 1409  BP: 147/98  Pulse: 107  Temp: 98.6 F (37 C)  TempSrc: Oral  Height: 5' (1.524 m)  Weight: 119 lb 4.8 oz (54.114 kg)  SpO2: 98%   Physical Exam  Constitutional: She is oriented to person, place, and time. She appears to be writhing in pain. She appears not lethargic, not malnourished, not dehydrated and not jaundiced. She appears healthy. She appears not cachectic.  Non-toxic appearance. She does not have a sickly appearance. She appears distressed.       She is tearful in pain and she prefers to lie on her right side during exam.   HENT:  Head: Normocephalic and atraumatic.  Eyes: Conjunctivae normal and EOM are normal. Pupils are equal, round, and reactive to light.  Neck: Normal range of motion. Neck supple.  Cardiovascular: Normal rate, regular rhythm, normal heart sounds and intact distal pulses.  Exam reveals no gallop and no friction rub.   No murmur heard. Pulmonary/Chest: Effort normal and breath sounds normal. No respiratory distress. She has no wheezes. She has no rales. She exhibits no tenderness.  Abdominal: Normal appearance and bowel sounds are normal. She exhibits no shifting dullness, no distension, no ascites and no mass. There is no splenomegaly or hepatomegaly. There is tenderness in the  left upper quadrant. There is no rigidity, no rebound, no guarding, no tenderness at McBurney's point and negative Murphy's sign.  Genitourinary: Rectum normal. Rectal exam shows no external hemorrhoid, no fissure, no laceration, no mass and no tenderness. Guaiac negative stool.       Anal area looks normal. The rectum is empty. No masses palpable.  Examining finger reveals semi-formed stool, brown in color and the FOBT is negative.   Musculoskeletal: Normal range of motion. She exhibits no edema and no tenderness.  Neurological: She is oriented to person, place, and time. She appears not lethargic. GCS score is 15.  Skin: Skin is warm and dry. She is not diaphoretic.   Assessment & Plan:  I have discussed the assessment and plan for the care of this patient with Dr. Eben Burow as summarized below.  [name] is a 56 year old woman with presumed recurrent acute on chronic pancreatitis, and a past medical history significant for pancreatic divisum, diabetes mellitus type 2, hypertension, and hyperlipidemia. She presents today to the clinic with an episode of similar severe left upper quadrant pain for 3 days associated with frequent loose motions of watery stools. Her pain has been severe since onset and is constant and she has reduced by mouth intake. The patient is in severe pain on examination. Lipase is elevated to 87 from 57 on 02/27/2012. Potassium is low at 3.3. White blood cell count is within normal limits. The urinalysis is largely normal.   Inpatient service has been contacted for admission given elevated lipase compared from 2 weeks ago and reduced PO intake. She will need NPO for bowel rest, IVF and inpatient pain management.

## 2012-03-12 NOTE — Progress Notes (Signed)
Pt taken to Room 5527 via w/c with belongings at 5:30PM. Stanton Kidney Marilee Ditommaso RN 03/12/12 5:30PM

## 2012-03-12 NOTE — Progress Notes (Signed)
03/12/12 - 1700 Report called to RN on 5500.

## 2012-03-12 NOTE — Telephone Encounter (Signed)
Pt calls, stating she is having severe pain in her upper L side and diarrhea x several days, has appt Thursday but cannot wait until, desires appt today if possible, appt scheduled for today at 1415 dr Zada Girt per Charlynn Grimes. She is cautioned that if she gets worse or cannot wait to go to ED

## 2012-03-12 NOTE — Progress Notes (Signed)
Poor venous access; IV team called. 

## 2012-03-13 LAB — COMPREHENSIVE METABOLIC PANEL
ALT: 11 U/L (ref 0–35)
Alkaline Phosphatase: 80 U/L (ref 39–117)
BUN: 8 mg/dL (ref 6–23)
CO2: 23 mEq/L (ref 19–32)
Calcium: 9 mg/dL (ref 8.4–10.5)
GFR calc Af Amer: >90 mL/min (ref >90)
GFR calc non Af Amer: >90 mL/min (ref >90)
Glucose, Bld: 167 mg/dL — ABNORMAL HIGH (ref 70–99)
Potassium: 3.9 mEq/L (ref 3.5–5.1)
Total Protein: 7.2 g/dL (ref 6.0–8.3)

## 2012-03-13 LAB — CBC
HCT: 35 % — ABNORMAL LOW (ref 36.0–46.0)
MCHC: 33.7 g/dL (ref 30.0–36.0)
RDW: 14.5 % (ref 11.5–15.5)
WBC: 8.3 10*3/uL (ref 4.0–10.5)

## 2012-03-13 LAB — URINALYSIS, ROUTINE W REFLEX MICROSCOPIC
Bilirubin Urine: NEGATIVE
Ketones, ur: NEGATIVE mg/dL
Nitrite: NEGATIVE
Protein, ur: NEGATIVE mg/dL

## 2012-03-13 LAB — GLUCOSE, CAPILLARY
Glucose-Capillary: 106 mg/dL — ABNORMAL HIGH (ref 70–99)
Glucose-Capillary: 116 mg/dL — ABNORMAL HIGH (ref 70–99)

## 2012-03-13 LAB — TROPONIN I: Troponin I: <0.3 ng/mL (ref <0.30)

## 2012-03-13 LAB — URINE MICROSCOPIC-ADD ON

## 2012-03-13 NOTE — H&P (Signed)
Internal Medicine Teaching Service Attending Note Date: 03/13/2012  Patient name: [name]  Medical record number: [id number]  Date of birth: 05/31/1955   I have seen and evaluated [name] and discussed their care with the Residency Team.    [name] is a 56yo woman, well known to our service, with Pancreatic divisum and multiple admissions for abdominal pain, nausea, vomiting.  [name] was discharged earlier this month when her symptoms were improving.  Since being home, she reported that she did well for a few days eating mostly jello and pudding.  Her appetite returned on Sunday and she tried to eat some food that her husband made, however, almost immediately after eating she experienced diarrhea, nausea and sharp/stabbing abdominal pain.  She noted that the pain was "20/10" and radiated to the back, mostly present in the LUQ.  This is very consistent with her previous episodes of pain.  She is also having bowel movements up to 6 times per day and has noticed some red blood in the toilet and on the tissue when she uses the bathroom.    She denies fever, chills, SOB< chest pain, dysuria, vomiting.   For further PMH, PSH, PFSH, meds/allergies, please see resident note.   Physical Exam: Blood pressure 108/70, pulse 62, temperature 97.7 F (36.5 C), temperature source Oral, resp. rate 18, height 5' (1.524 m), weight 120 lb (54.432 kg), SpO2 97.00%. General appearance: alert, cooperative and mild distress Head: Normocephalic, without obvious abnormality, atraumatic Eyes: EOMI, no icterus Lungs: wheezes none Heart: RR, NR, no murmur Abdomen: Soft, very ttp in LUQ, epigastrium, BS normal, no rebound or distention Extremities: warm, dry, no edema Pulses: 2+ and symmetric Skin: no rash, wound   Lab results: Results for orders placed during the hospital encounter of 03/12/12 (from the past 24 hour(s))  GLUCOSE, CAPILLARY     Status: Abnormal   Collection Time   03/12/12  6:21 PM      Component Value Range   Glucose-Capillary 137 (*) 70 - 99 mg/dL   Comment 1 Documented in Chart     Comment 2 Notify RN    LACTIC ACID, PLASMA     Status: Normal   Collection Time   03/12/12  6:47 PM      Component Value Range   Lactic Acid, Venous 1.2  0.5 - 2.2 mmol/L  GLUCOSE, CAPILLARY     Status: Abnormal   Collection Time   03/12/12  8:06 PM      Component Value Range   Glucose-Capillary 143 (*) 70 - 99 mg/dL  BASIC METABOLIC PANEL     Status: Abnormal   Collection Time   03/12/12  8:30 PM      Component Value Range   Sodium 137  135 - 145 mEq/L   Potassium 3.2 (*) 3.5 - 5.1 mEq/L   Chloride 99  96 - 112 mEq/L   CO2 24  19 - 32 mEq/L   Glucose, Bld 149 (*) 70 - 99 mg/dL   BUN 9  6 - 23 mg/dL   Creatinine, Ser 4.09  0.50 - 1.10 mg/dL   Calcium 9.9  8.4 - 81.1 mg/dL   GFR calc non Af Amer >90  >90 mL/min   GFR calc Af Amer >90  >90 mL/min  MAGNESIUM     Status: Normal   Collection Time   03/12/12  8:30 PM      Component Value Range   Magnesium 1.6  1.5 - 2.5 mg/dL  GLUCOSE,  CAPILLARY     Status: Abnormal   Collection Time   03/12/12 11:50 PM      Component Value Range   Glucose-Capillary 121 (*) 70 - 99 mg/dL  URINALYSIS, ROUTINE W REFLEX MICROSCOPIC     Status: Abnormal   Collection Time   03/13/12  1:49 AM      Component Value Range   Color, Urine YELLOW  YELLOW   APPearance CLEAR  CLEAR   Specific Gravity, Urine 1.008  1.005 - 1.030   pH 5.0  5.0 - 8.0   Glucose, UA NEGATIVE  NEGATIVE mg/dL   Hgb urine dipstick NEGATIVE  NEGATIVE   Bilirubin Urine NEGATIVE  NEGATIVE   Ketones, ur NEGATIVE  NEGATIVE mg/dL   Protein, ur NEGATIVE  NEGATIVE mg/dL   Urobilinogen, UA 0.2  0.0 - 1.0 mg/dL   Nitrite NEGATIVE  NEGATIVE   Leukocytes, UA SMALL (*) NEGATIVE  URINE MICROSCOPIC-ADD ON     Status: Abnormal   Collection Time   03/13/12  1:49 AM      Component Value Range   Squamous Epithelial / LPF RARE  RARE   WBC, UA 3-6  <3 WBC/hpf   RBC /  HPF 0-2  <3 RBC/hpf   Bacteria, UA FEW (*) RARE   Urine-Other RARE YEAST    COMPREHENSIVE METABOLIC PANEL     Status: Abnormal   Collection Time   03/13/12  3:18 AM      Component Value Range   Sodium 138  135 - 145 mEq/L   Potassium 3.9  3.5 - 5.1 mEq/L   Chloride 104  96 - 112 mEq/L   CO2 23  19 - 32 mEq/L   Glucose, Bld 167 (*) 70 - 99 mg/dL   BUN 8  6 - 23 mg/dL   Creatinine, Ser 2.13  0.50 - 1.10 mg/dL   Calcium 9.0  8.4 - 08.6 mg/dL   Total Protein 7.2  6.0 - 8.3 g/dL   Albumin 3.5  3.5 - 5.2 g/dL   AST 15  0 - 37 U/L   ALT 11  0 - 35 U/L   Alkaline Phosphatase 80  39 - 117 U/L   Total Bilirubin 0.5  0.3 - 1.2 mg/dL   GFR calc non Af Amer >90  >90 mL/min   GFR calc Af Amer >90  >90 mL/min  CBC     Status: Abnormal   Collection Time   03/13/12  3:18 AM      Component Value Range   WBC 8.3  4.0 - 10.5 K/uL   RBC 4.49  3.87 - 5.11 MIL/uL   Hemoglobin 11.8 (*) 12.0 - 15.0 g/dL   HCT 57.8 (*) 46.9 - 62.9 %   MCV 78.0  78.0 - 100.0 fL   MCH 26.3  26.0 - 34.0 pg   MCHC 33.7  30.0 - 36.0 g/dL   RDW 52.8  41.3 - 24.4 %   Platelets 402 (*) 150 - 400 K/uL  GLUCOSE, CAPILLARY     Status: Abnormal   Collection Time   03/13/12  4:13 AM      Component Value Range   Glucose-Capillary 154 (*) 70 - 99 mg/dL  GLUCOSE, CAPILLARY     Status: Abnormal   Collection Time   03/13/12  7:37 AM      Component Value Range   Glucose-Capillary 106 (*) 70 - 99 mg/dL  TROPONIN I     Status: Normal   Collection Time   03/13/12  11:15 AM      Component Value Range   Troponin I <0.30  <0.30 ng/mL  GLUCOSE, CAPILLARY     Status: Abnormal   Collection Time   03/13/12 11:57 AM      Component Value Range   Glucose-Capillary 125 (*) 70 - 99 mg/dL    Imaging results:  No results found.  Assessment and Plan: I agree with the formulated Assessment and Plan with the following changes:   1. Acute on Chronic abdominal pain - Etiology unclear, has been attributed to pancreatic divisum and ?  Chronic pancreatitis.  Her Lipase is up to 87 from 57.  She has a recent EUS and EGD which revealed normal esophageal and duodenal mucosa without masses - Will treat her symptomatically with bowel rest, hydration, pain control and nausea control - She has been seen by a specialist at [facility]  for stent placement in the pancreas, will attempt follow up in that clinic for the patient  Other issues as per resident note - Replete K - Gap closed on repeat BMP  Inez Catalina, MD 11/12/20132:55 PM

## 2012-03-14 DIAGNOSIS — E119 Type 2 diabetes mellitus without complications: Secondary | ICD-10-CM

## 2012-03-14 DIAGNOSIS — I10 Essential (primary) hypertension: Secondary | ICD-10-CM

## 2012-03-14 DIAGNOSIS — E876 Hypokalemia: Secondary | ICD-10-CM

## 2012-03-14 LAB — GLUCOSE, CAPILLARY
Glucose-Capillary: 115 mg/dL — ABNORMAL HIGH (ref 70–99)
Glucose-Capillary: 156 mg/dL — ABNORMAL HIGH (ref 70–99)
Glucose-Capillary: 190 mg/dL — ABNORMAL HIGH (ref 70–99)

## 2012-03-14 LAB — URINE CULTURE

## 2012-03-14 LAB — LIPASE, BLOOD: Lipase: 57 U/L (ref 11–59)

## 2012-03-14 MED ORDER — MAGNESIUM SULFATE 40 MG/ML IJ SOLN
2.0000 g | Freq: Once | INTRAMUSCULAR | Status: AC
  Administered 2012-03-14: 2 g via INTRAVENOUS
  Filled 2012-03-14: qty 50

## 2012-03-14 MED ORDER — INFLUENZA VIRUS VACC SPLIT PF IM SUSP
0.5000 mL | Freq: Once | INTRAMUSCULAR | Status: AC
  Administered 2012-03-14: 0.5 mL via INTRAMUSCULAR
  Filled 2012-03-14: qty 0.5

## 2012-03-14 MED ORDER — POLYETHYLENE GLYCOL 3350 17 G PO PACK
17.0000 g | PACK | Freq: Every day | ORAL | Status: DC
  Administered 2012-03-14 – 2012-03-16 (×3): 17 g via ORAL
  Filled 2012-03-14 (×5): qty 1

## 2012-03-14 NOTE — Progress Notes (Signed)
Subjective: She still has abdominal pain and nausea today but she feels a little better. She wanted to try clear liquids for lunch and tolerated it well.   Objective: Vital signs in last 24 hours: Filed Vitals:   03/13/12 1053 03/13/12 1313 03/13/12 2222 03/14/12 0527  BP: 122/84 108/70 125/75 138/83  Pulse:  62 68 67  Temp:  97.7 F (36.5 C) 98.5 F (36.9 C) 98.4 F (36.9 C)  TempSrc:  Oral Oral Oral  Resp:  18 18 20   Height:      Weight:    119 lb 14.9 oz (54.4 kg)  SpO2:  97% 95% 98%   Weight change: 3 lb 13.3 oz (1.737 kg)  Intake/Output Summary (Last 24 hours) at 03/14/12 0756 Last data filed at 03/14/12 0500  Gross per 24 hour  Intake      0 ml  Output   1750 ml  Net  -1750 ml   Vitals reviewed. General: resting in bed, NAD HEENT:  no scleral icterus Cardiac: RRR, no rubs, murmurs or gallops Pulm: clear to auscultation bilaterally, no wheezes, rales, or rhonchi Abd: soft, diffusely tender, nondistended, BS present Ext: warm and well perfused, no pedal edema Neuro: alert and oriented X3, cranial nerves II-XII grossly intact, strength and sensation to light touch equal in bilateral upper and lower extremities  Lab Results: Basic Metabolic Panel:  Lab 03/13/12 0981 03/12/12 2030  NA 138 137  K 3.9 3.2*  CL 104 99  CO2 23 24  GLUCOSE 167* 149*  BUN 8 9  CREATININE 0.56 0.54  CALCIUM 9.0 9.9  MG -- 1.6  PHOS -- --   Liver Function Tests:  Lab 03/13/12 0318 03/12/12 1448  AST 15 16  ALT 11 13  ALKPHOS 80 91  BILITOT 0.5 0.4  PROT 7.2 8.7*  ALBUMIN 3.5 4.1    Lab 03/12/12 1448  LIPASE 87*  AMYLASE --   CBC:  Lab 03/13/12 0318 03/12/12 1448  WBC 8.3 10.4  NEUTROABS -- 5.0  HGB 11.8* 13.3  HCT 35.0* 38.7  MCV 78.0 77.4*  PLT 402* 454*   Cardiac Enzymes:  Lab 03/13/12 1115  CKTOTAL --  CKMB --  CKMBINDEX --  TROPONINI <0.30   CBG:  Lab 03/14/12 0323 03/13/12 2352 03/13/12 2000 03/13/12 1719 03/13/12 1157 03/13/12 0737  GLUCAP 115*  129* 116* 104* 125* 106*   Urine Drug Screen: Drugs of Abuse     Component Value Date/Time   LABOPIA POSITIVE* 02/28/2012 1422   LABOPIA NEG 08/30/2011 1526   COCAINSCRNUR NONE DETECTED 02/28/2012 1422   COCAINSCRNUR NEG 08/30/2011 1526   LABBENZ NONE DETECTED 02/28/2012 1422   LABBENZ NEG 08/30/2011 1526   LABBENZ NEG 01/24/2011 1414   AMPHETMU NONE DETECTED 02/28/2012 1422   AMPHETMU NEG 01/24/2011 1414   THCU NONE DETECTED 02/28/2012 1422   LABBARB NONE DETECTED 02/28/2012 1422   LABBARB NEG 08/30/2011 1526    Urinalysis:  Lab 03/13/12 0149 03/12/12 1436  COLORURINE YELLOW YELLOW  LABSPEC 1.008 1.012  PHURINE 5.0 6.0  GLUCOSEU NEGATIVE 250*  HGBUR NEGATIVE NEG  BILIRUBINUR NEGATIVE NEG  KETONESUR NEGATIVE NEG  PROTEINUR NEGATIVE NEG  UROBILINOGEN 0.2 0.2  NITRITE NEGATIVE NEG  LEUKOCYTESUR SMALL* NEG    Micro Results: Recent Results (from the past 240 hour(s))  URINE CULTURE     Status: Normal   Collection Time   03/13/12  1:49 AM      Component Value Range Status Comment   Specimen Description URINE, CLEAN  CATCH   Final    Special Requests CX ADDED AT 0217 ON [id number]   Final    Culture  Setup Time 03/13/2012 08:50   Final    Colony Count 9,000 COLONIES/ML   Final    Culture INSIGNIFICANT GROWTH   Final    Report Status 03/14/2012 FINAL   Final    Studies/Results: No results found. Medications: I have reviewed the patient's current medications. Scheduled Meds:   . enoxaparin (LOVENOX) injection  40 mg Subcutaneous Q24H  . lisinopril  20 mg Oral Daily   And  . hydrochlorothiazide  12.5 mg Oral Daily  . insulin aspart  0-9 Units Subcutaneous Q4H  . pantoprazole (PROTONIX) IV  40 mg Intravenous Q24H  . pneumococcal 23 valent vaccine  0.5 mL Intramuscular Tomorrow-1000   Continuous Infusions:   . sodium chloride 125 mL/hr at 03/13/12 1300   PRN Meds:.HYDROmorphone (DILAUDID) injection, oxyCODONE, promethazine, promethazine Assessment/Plan: [name] is a  56 yo female with a history of pancreas divisum s/p stenting, type 2 DM, HTN, recently discharged from the teaching service for abdominal pain/N, who presents as a direct admission from clinic with worsening abdominal pain, diarrhea, and nausea.   #. Acute on chronic abdominal pain: Patient's abdominal pain is most likely due to flare up of pancreatitis. Patient was just discharged from the hospital after being treated for the same problem. Patient has a known history of pancreatic divisum status post stents (in 2011 and removal in 2012). She presented to the [facility] on 11/10 with an episode of similar severe left upper quadrant pain for 3 days associated with frequent loose, watery stools. Lipase was elevated to 87 from 57 on 02/27/2012, which is consistent with a slight flare in pancreatitis. The etiology for her acute on chronic exacerbation is not clear. Other differentials peptic ulcer disease versus gastritis. She recently underwent EUS and EGD on 10/05/2011 reviewed normal esophageal and duodenal mucosa with no masses, cysts or changes, indicating that gastritis is less likely.  -Advanced diet to clear liquids today -Hydration with IV fluids  -Dilaudid 0.5mg  q3 hr for pain  -Oxycodone 5mg  q 4hr PRN for pain -Phenergan for nausea.  -Pt may benefit from a f/u appt with her GI specialist at [facility]   # Elevated anion gap: Resolved on 03/11/12. AG of 18 on admission. She does not seem to have typical DKA, since her bicarbonate is 23 with CBG of 141 and normal-low potassium. Patient has been on metformin at home for her diabetes. Due to severe nausea vomiting and diarrhea, patient may have metformin-induced lactic acidosis. Her albumin level is normal at 4.1, ruling out the possibility of paraprotein related AG elevation. She denies any history of intoxication or ingestions. UA negative for ketones.  -IV fluid: NS at 150/h.  -Recheck BMET   #: Hypokalemia: Potassium was 3.3 on admission. It is 3.9  today, after repletion. This was most likely due to vomiting and diarrhea.  -will replete K as needed -Repeat BMP  -Mg at 1.6, will repleted, will recheck tomorrow  # Type 2 diabetes mellitus: Last A1c was 8.4 on 02/27/12. She has been taking metformin at home. CBGs in 129-115 - Will d/c metformin  - Start Sliding scale insulin, CBGs below 160, she required total of 3 units of aspart insulin correction today after starting clear liquids.   #. Hypertension: Blood pressure mildly elevated likely secondary to acute distress.  -Continue to home Prinzide.   # DVT PPx: lovenox.   LOS:  2 days   Ky Barban 03/14/2012, 7:56 AM

## 2012-03-14 NOTE — Progress Notes (Signed)
Internal Medicine Attending  Date: 03/14/2012  Patient name: [name] Medical record number: [id number] Date of birth: Feb 24, 1956 Age: 56 y.o. Gender: female  I saw and evaluated the patient. I reviewed the resident's note by Dr. Garald Braver and I agree with the resident's findings and plans as documented in her note.   Signed Criselda Peaches, Jenna Routzahn

## 2012-03-15 ENCOUNTER — Ambulatory Visit: Payer: BC Managed Care – PPO | Admitting: Internal Medicine

## 2012-03-15 DIAGNOSIS — R109 Unspecified abdominal pain: Secondary | ICD-10-CM

## 2012-03-15 LAB — GLUCOSE, CAPILLARY
Glucose-Capillary: 100 mg/dL — ABNORMAL HIGH (ref 70–99)
Glucose-Capillary: 138 mg/dL — ABNORMAL HIGH (ref 70–99)
Glucose-Capillary: 276 mg/dL — ABNORMAL HIGH (ref 70–99)

## 2012-03-15 LAB — COMPREHENSIVE METABOLIC PANEL
Alkaline Phosphatase: 74 U/L (ref 39–117)
BUN: 3 mg/dL — ABNORMAL LOW (ref 6–23)
Calcium: 8.7 mg/dL (ref 8.4–10.5)
GFR calc Af Amer: >90 mL/min (ref >90)
Glucose, Bld: 136 mg/dL — ABNORMAL HIGH (ref 70–99)
Total Protein: 6.8 g/dL (ref 6.0–8.3)

## 2012-03-15 LAB — CBC
MCH: 26.8 pg (ref 26.0–34.0)
Platelets: 378 10*3/uL (ref 150–400)
RBC: 4.22 MIL/uL (ref 3.87–5.11)
RDW: 14.7 % (ref 11.5–15.5)
WBC: 6.6 10*3/uL (ref 4.0–10.5)

## 2012-03-15 MED ORDER — GLUCAGON HCL (RDNA) 1 MG IJ SOLR: 0.5000 mg | Freq: Once | INTRAMUSCULAR | Status: AC | PRN

## 2012-03-15 MED ORDER — DEXTROSE 5 % IV SOLN: INTRAVENOUS | Status: DC

## 2012-03-15 MED ORDER — GLUCAGON HCL (RDNA) 1 MG IJ SOLR: 1.0000 mg | Freq: Once | INTRAMUSCULAR | Status: AC | PRN

## 2012-03-15 MED ORDER — DEXTROSE 50 % IV SOLN: 25.0000 mL | Freq: Once | INTRAVENOUS | Status: AC | PRN

## 2012-03-15 MED ORDER — DEXTROSE 50 % IV SOLN: 25.0000 mL | Freq: Once | INTRAVENOUS | Status: DC | PRN

## 2012-03-15 MED ORDER — GLUCOSE 40 % PO GEL: 1.0000 | Freq: Once | ORAL | Status: AC | PRN

## 2012-03-15 MED ORDER — DEXTROSE 50 % IV SOLN: 50.0000 mL | Freq: Once | INTRAVENOUS | Status: AC | PRN

## 2012-03-15 MED ORDER — GLUCAGON HCL (RDNA) 1 MG IJ SOLR: 1.0000 mg | Freq: Once | INTRAMUSCULAR | Status: DC | PRN

## 2012-03-15 MED ORDER — GLUCAGON HCL (RDNA) 1 MG IJ SOLR: 0.5000 mg | Freq: Once | INTRAMUSCULAR | Status: DC | PRN

## 2012-03-15 MED ORDER — DEXTROSE 50 % IV SOLN: 50.0000 mL | Freq: Once | INTRAVENOUS | Status: DC | PRN

## 2012-03-15 MED ORDER — GLUCOSE 40 % PO GEL: 1.0000 | ORAL | Status: DC | PRN

## 2012-03-15 MED ORDER — GLUCOSE-VITAMIN C 4-6 GM-MG PO CHEW: 4.0000 | CHEWABLE_TABLET | ORAL | Status: DC | PRN

## 2012-03-15 NOTE — Progress Notes (Signed)
Internal Medicine Progress Note  S: [name] ws doing better this morning, however, she attempted full liquid diet and had a recurrence of nausea.    OCeasar Mons Vitals:   03/14/12 2022 03/15/12 0400 03/15/12 0940 03/15/12 1500  BP: 136/75 153/79 126/72 133/72  Pulse: 64 71  68  Temp: 97.8 F (36.6 C) 98.1 F (36.7 C)  98.1 F (36.7 C)  TempSrc: Oral Oral  Oral  Resp: 14 14  18   Height:      Weight:  120 lb 13 oz (54.8 kg)    SpO2: 99% 100%  100%   General: Awake, alert, nauseated when I saw her Head: NCAT Eyes: EOMI, anicteric CVS: RR, NR, no murmur noted Pulm: CTAB, no wheezine Abd: mild improvement in her abdominal pain, though still tender to palpation in the epigastrium and LUQ.  Soft, +BS Ext: Thin, warm Neuro: MAE, alert, grossly normal  Labs:  Na 142, K 2.8, Cl 107, CO2 25, BUN 3, Cr 0.53, Albumin 3.2 WBC 6.6 H/H 11.3/32.8 CBGs 110s - 170s  No new imaging  Assessment:  [name] is a 56yo woman with acute on chronic abdominal pain thought to be due to acute on chronic pancreatitis  Plan  1. Acute on chronic abdominal pain - She has been extensively worked up for abdominal pain/chronic pancreatitis in the past with no definitive answer - Continue pain control - Continue slow advance of diet - Monitor electrolytes and replete as needed - Nausea control with phenergan - Will run a screen for porphyria and slowed gastric emptying to further work up her chronic issue; it is not clear that pancreatitis can explain her complete picture.   2. Type 2 DM - Have held Metformin - On sliding scale - Will need to decide if insulin is correct choice long term for her as she is not regularly eating at home  3. HTN - Continue home meds  DVT ppx - Lovenox  Anticipate discharge in next day or two  Signed MULLEN, EMILY

## 2012-03-15 NOTE — Plan of Care (Signed)
Problem: Phase II Progression Outcomes Goal: IV changed to normal saline lock Outcome: Completed/Met Date Met:  03/15/12 Left at 10/hr pt getting IV dilaudid

## 2012-03-15 NOTE — Care Management Note (Signed)
    Page 1 of 1   03/19/2012     5:56:09 PM   CARE MANAGEMENT NOTE 03/19/2012  Patient:  [name], [name]   Account Number:  [id number]  Date Initiated:  03/15/2012  Documentation initiated by:  Letha Cape  Subjective/Objective Assessment:   dx pancreatitis  admit- lives with spouse. pta independent.     Action/Plan:   Anticipated DC Date:  03/18/2012   Anticipated DC Plan:  HOME/SELF CARE      DC Planning Services  CM consult      Choice offered to / List presented to:             Status of service:  Completed, signed off Medicare Important Message given?   (If response is "NO", the following Medicare IM given date fields will be blank) Date Medicare IM given:   Date Additional Medicare IM given:    Discharge Disposition:  HOME/SELF CARE  Per UR Regulation:  Reviewed for med. necessity/level of care/duration of stay  If discussed at Long Length of Stay Meetings, dates discussed:    Comments:  03/15/12 13:44 Letha Cape RN, BSN [phone] patient lives with spouse, pta independent.  NCM will continue to follow for dc needs.

## 2012-03-15 NOTE — Progress Notes (Signed)
Hypoglycemic Event   CBG: 56  Treatment: 15 GM carbohydrate snack  Symptoms: Shaky  Follow-up CBG: Time: 2250 CBG Result: 91  Possible Reasons for Event: Inadequate meal intake  Comments/MD notified: None    [name]  Remember to initiate Hypoglycemia Order Set & complete

## 2012-03-16 ENCOUNTER — Inpatient Hospital Stay (HOSPITAL_COMMUNITY): Payer: BC Managed Care – PPO

## 2012-03-16 DIAGNOSIS — K859 Acute pancreatitis without necrosis or infection, unspecified: Principal | ICD-10-CM

## 2012-03-16 DIAGNOSIS — G8929 Other chronic pain: Secondary | ICD-10-CM

## 2012-03-16 LAB — GLUCOSE, CAPILLARY
Glucose-Capillary: 108 mg/dL — ABNORMAL HIGH (ref 70–99)
Glucose-Capillary: 124 mg/dL — ABNORMAL HIGH (ref 70–99)
Glucose-Capillary: 203 mg/dL — ABNORMAL HIGH (ref 70–99)

## 2012-03-16 MED ORDER — TECHNETIUM TC 99M SULFUR COLLOID
2.0000 | Freq: Once | INTRAVENOUS | Status: AC | PRN
  Administered 2012-03-16: 2 via INTRAVENOUS

## 2012-03-16 NOTE — Progress Notes (Signed)
Subjective: She had one hypoglycemic episode last night with CBG of 56; she was given a small carbohydrate snack with CBG of 91. She was made NPO overnight for delayed gastric emptying study today. Her abdominal pain is a little better today.   Objective: Vital signs in last 24 hours: Filed Vitals:   03/15/12 0940 03/15/12 1500 03/15/12 2155 03/16/12 0424  BP: 126/72 133/72 138/71 120/61  Pulse:  68 65 62  Temp:  98.1 F (36.7 C) 98 F (36.7 C) 97.8 F (36.6 C)  TempSrc:  Oral  Oral  Resp:  18 16 16   Height:      Weight:    122 lb 6.4 oz (55.52 kg)  SpO2:  100% 100% 96%   Weight change: 1 lb 9.4 oz (0.72 kg)  Intake/Output Summary (Last 24 hours) at 03/16/12 1319 Last data filed at 03/16/12 0844  Gross per 24 hour  Intake    670 ml  Output   1650 ml  Net   -980 ml   Vitals reviewed.  General: resting in bed, NAD  HEENT: no scleral icterus  Cardiac: RRR, no rubs, murmurs or gallops  Pulm: clear to auscultation bilaterally, no wheezes, rales, or rhonchi  Abd: soft, diffusely tender (she has severe tenderness with slight touch of her LUQ with my stethoscope) nondistended, BS present  Ext: warm and well perfused, no pedal edema  Neuro: alert and oriented X3, cranial nerves II-XII grossly intact, strength and sensation to light touch equal in bilateral upper and lower extremities  Lab Results: Basic Metabolic Panel:  Lab 03/15/12 2130 03/14/12 1939 03/13/12 0318 03/12/12 2030  NA 142 -- 138 --  K 3.8 -- 3.9 --  CL 107 -- 104 --  CO2 25 -- 23 --  GLUCOSE 136* -- 167* --  BUN 3* -- 8 --  CREATININE 0.53 -- 0.56 --  CALCIUM 8.7 -- 9.0 --  MG -- 1.7 -- 1.6  PHOS -- -- -- --   Liver Function Tests:  Lab 03/15/12 0625 03/13/12 0318  AST 15 15  ALT 9 11  ALKPHOS 74 80  BILITOT 0.4 0.5  PROT 6.8 7.2  ALBUMIN 3.2* 3.5    Lab 03/14/12 1939 03/12/12 1448  LIPASE 57 87*  AMYLASE -- --   CBC:  Lab 03/15/12 0625 03/13/12 0318 03/12/12 1448  WBC 6.6 8.3 --    NEUTROABS -- -- 5.0  HGB 11.3* 11.8* --  HCT 32.8* 35.0* --  MCV 77.7* 78.0 --  PLT 378 402* --   Cardiac Enzymes:  Lab 03/13/12 1115  CKTOTAL --  CKMB --  CKMBINDEX --  TROPONINI <0.30   CBG:  Lab 03/16/12 0730 03/16/12 0421 03/16/12 0005 03/15/12 2253 03/15/12 2238 03/15/12 2032  GLUCAP 124* 148* 108* 91 56* 276*   Drugs of Abuse     Component Value Date/Time   LABOPIA POSITIVE* 02/28/2012 1422   LABOPIA NEG 08/30/2011 1526   COCAINSCRNUR NONE DETECTED 02/28/2012 1422   COCAINSCRNUR NEG 08/30/2011 1526   LABBENZ NONE DETECTED 02/28/2012 1422   LABBENZ NEG 08/30/2011 1526   LABBENZ NEG 01/24/2011 1414   AMPHETMU NONE DETECTED 02/28/2012 1422   AMPHETMU NEG 01/24/2011 1414   THCU NONE DETECTED 02/28/2012 1422   LABBARB NONE DETECTED 02/28/2012 1422   LABBARB NEG 08/30/2011 1526    Urinalysis:  Lab 03/13/12 0149 03/12/12 1436  COLORURINE YELLOW YELLOW  LABSPEC 1.008 1.012  PHURINE 5.0 6.0  GLUCOSEU NEGATIVE 250*  HGBUR NEGATIVE NEG  BILIRUBINUR NEGATIVE NEG  KETONESUR NEGATIVE NEG  PROTEINUR NEGATIVE NEG  UROBILINOGEN 0.2 0.2  NITRITE NEGATIVE NEG  LEUKOCYTESUR SMALL* NEG    Micro Results: Recent Results (from the past 240 hour(s))  URINE CULTURE     Status: Normal   Collection Time   03/13/12  1:49 AM      Component Value Range Status Comment   Specimen Description URINE, CLEAN CATCH   Final    Special Requests CX ADDED AT 0217 ON 161096   Final    Culture  Setup Time 03/13/2012 08:50   Final    Colony Count 9,000 COLONIES/ML   Final    Culture INSIGNIFICANT GROWTH   Final    Report Status 03/14/2012 FINAL   Final    Studies/Results: No results found. Medications: I have reviewed the patient's current medications. Scheduled Meds:   . enoxaparin (LOVENOX) injection  40 mg Subcutaneous Q24H  . lisinopril  20 mg Oral Daily   And  . hydrochlorothiazide  12.5 mg Oral Daily  . insulin aspart  0-9 Units Subcutaneous Q4H  . pantoprazole (PROTONIX) IV  40  mg Intravenous Q24H  . polyethylene glycol  17 g Oral Daily   Continuous Infusions:   . [DISCONTINUED] dextrose    . [DISCONTINUED] dextrose    . [DISCONTINUED] dextrose     PRN Meds:.[EXPIRED] dextrose, [EXPIRED] dextrose, [EXPIRED] dextrose, [EXPIRED] glucagon, [EXPIRED] glucagon, glucose-Vitamin C, HYDROmorphone (DILAUDID) injection, oxyCODONE, promethazine, promethazine, [COMPLETED] technetium sulfur colloid, [DISCONTINUED] dextrose, [DISCONTINUED] dextrose, [DISCONTINUED] dextrose, [DISCONTINUED] dextrose, [DISCONTINUED] glucagon, [DISCONTINUED] glucagon Assessment/Plan: [name] is a 56 yo female with a history of pancreas divisum s/p stenting, type 2 DM, HTN, recently discharged from the teaching service for abdominal pain/N, who presents as a direct admission from clinic with worsening abdominal pain, diarrhea, and nausea.  #. Acute on chronic abdominal pain: Etiology unclear. This was thought to be most likely due to flare up of pancreatitis however her lipase was normal (no elevated 3 times the UL of normal). She had been just discharged from the hospital after being treated for the same problem. According to her GI specialist at [facility], Dr. Gery Pray, she does have pancreatic divisum but it does not explain her abdominal pain. Per Dr. Gery Pray, the patient has had extensive imaging, including multiple CT of her abdomen, MRCP, and EUS/EGD (10/05/11), and colonoscopy in January; all unremarkable.  She had stents placed in 2011 that were later removed due to worsening of her symptoms. Per Dr. Gery Pray, patient has never met criteria for acute pancreatitis with no lipase elevated to three times the upper level of normal or CT findings consistent with acute or chronic pancreatitis. Here at the [facility] system, [name] has one lipase level of 197 on 08/2009 meeting the criteria for one episode of acute pancreatitis. She has had multiple admissions for acute pancreatitis since then but never  with a lipase level to support this diagnosis. We do not have a delayed gastric emptying study. Given that her abdominal pain is worsened with food intake, a study was ordered.  -NPO after midnight for gastric emptying study -Dilaudid 0.5mg  q3 hr for pain  -Oxycodone 5mg  q 4hr PRN for pain  -Phenergan for nausea.  -Pt will follow up with Dr. Gery Pray, GI specialist at [facility] in January  # Elevated anion gap: Resolved on 03/11/12. AG of 18 on admission. She does not seem to have typical DKA, since her bicarbonate is 23 with CBG of 141 and normal-low potassium. Patient has been on metformin  at home for her diabetes. Due to severe nausea vomiting and diarrhea, patient may have metformin-induced lactic acidosis. Her albumin level is normal at 4.1, ruling out the possibility of paraprotein related AG elevation. She denies any history of intoxication or ingestions. UA negative for ketones.  -D/cd IV fluid: NS at 150/h.  -Recheck BMET   #: Hypokalemia: Potassium was 3.3 on admission and was repleted. Her K is 3.8 today. This was most likely due to vomiting and diarrhea.  -will replete K as needed  -Repeat BMP  -Mg at 1.7, will replete  # Type 2 diabetes mellitus: Last A1c was 8.4 on 02/27/12. She has been taking metformin at home. She had CBG of 53 last night, corrected to 91 but CBG of 141 today, requiring 1 unit of insulin aspart.  - Will d/c metformin  -  Continued Sliding scale insulin  #. Hypertension: Blood pressure well controlled today.  -Continue home Prinzide.   # DVT PPx: lovenox.   Dispo: Disposition is deferred at this time, awaiting improvement of current medical problems.  Anticipated discharge in approximately 1 day. She may go home today if her gastric emptying study is negative and if she is able to tolerate foods.   The patient does have current PCP (KALIA-REYNOLDS, SHELLY, DO), therefore will be require OPC follow-up after discharge.   The patient does have  transportation limitations that hinder transportation to clinic appointments.  No services needed at her discharge.     LOS: 4 days   Ky Barban 03/16/2012, 1:19 PM

## 2012-03-16 NOTE — Progress Notes (Signed)
Internal Medicine Attending  Date: 03/16/2012  Patient name: [name] Medical record number: [id number] Date of birth: 1955-06-02 Age: 56 y.o. Gender: female  I saw and evaluated the patient. I reviewed the resident's note by Dr. Garald Braver and I agree with the resident's findings and plans as documented in her note.  [name] is improving today.  She will get her GES today and may go home in the next day.   Signed  Criselda Peaches, EMILY

## 2012-03-16 NOTE — Plan of Care (Signed)
Problem: Phase II Progression Outcomes Goal: Progress activity as tolerated unless otherwise ordered Patioent progressing OOB to chair tolerated well

## 2012-03-17 LAB — GLUCOSE, CAPILLARY
Glucose-Capillary: 107 mg/dL — ABNORMAL HIGH (ref 70–99)
Glucose-Capillary: 177 mg/dL — ABNORMAL HIGH (ref 70–99)
Glucose-Capillary: 75 mg/dL (ref 70–99)

## 2012-03-17 LAB — CLOSTRIDIUM DIFFICILE BY PCR: Toxigenic C. Difficile by PCR: NEGATIVE

## 2012-03-17 LAB — BASIC METABOLIC PANEL
CO2: 26 mEq/L (ref 19–32)
Chloride: 101 mEq/L (ref 96–112)
GFR calc Af Amer: >90 mL/min (ref >90)
Sodium: 137 mEq/L (ref 135–145)

## 2012-03-17 LAB — MAGNESIUM: Magnesium: 1.4 mg/dL — ABNORMAL LOW (ref 1.5–2.5)

## 2012-03-17 MED ORDER — POTASSIUM CHLORIDE CRYS ER 20 MEQ PO TBCR
40.0000 meq | EXTENDED_RELEASE_TABLET | Freq: Once | ORAL | Status: AC
  Administered 2012-03-17: 40 meq via ORAL
  Filled 2012-03-17: qty 2

## 2012-03-17 MED ORDER — SODIUM CHLORIDE 0.9 % IV SOLN
INTRAVENOUS | Status: AC
  Administered 2012-03-17 (×2): via INTRAVENOUS

## 2012-03-17 MED ORDER — MAGNESIUM SULFATE 40 MG/ML IJ SOLN
2.0000 g | Freq: Once | INTRAMUSCULAR | Status: AC
  Administered 2012-03-17: 2 g via INTRAVENOUS
  Filled 2012-03-17: qty 50

## 2012-03-17 MED ORDER — PANTOPRAZOLE SODIUM 40 MG PO TBEC
40.0000 mg | DELAYED_RELEASE_TABLET | Freq: Every day | ORAL | Status: DC
  Administered 2012-03-17: 40 mg via ORAL
  Filled 2012-03-17: qty 1

## 2012-03-17 NOTE — Progress Notes (Signed)
Subjective:  She reports Diarrhea with 5 loose BM yesterday. She feels weak.  She tolerated gastric emptying study yesterday with negative finding. Her abdominal pain is a little better today.   Objective: Vital signs in last 24 hours: Filed Vitals:   03/16/12 1352 03/16/12 2135 03/17/12 0426 03/17/12 0951  BP: 136/79 126/71 90/50 112/61  Pulse: 70 81 78   Temp: 98.7 F (37.1 C) 98 F (36.7 C) 97.7 F (36.5 C)   TempSrc: Oral Oral Oral   Resp: 16 16 17    Height:      Weight:   117 lb 4.8 oz (53.207 kg)   SpO2: 98% 99% 98%    Weight change: -5 lb 1.6 oz (-2.313 kg)  Intake/Output Summary (Last 24 hours) at 03/17/12 1232 Last data filed at 03/17/12 0300  Gross per 24 hour  Intake      0 ml  Output    400 ml  Net   -400 ml   Vitals reviewed.  General: resting in bed, NAD  HEENT: no scleral icterus  Cardiac: RRR, no rubs, murmurs or gallops  Pulm: clear to auscultation bilaterally, no wheezes, rales, or rhonchi  Abd: soft, diffusely tender, more severe on the LUQ, no rebound pain, nondistended, BS present  Ext: warm and well perfused, no pedal edema  Neuro: alert and oriented X3, cranial nerves II-XII grossly intact, strength and sensation to light touch equal in bilateral upper and lower extremities  Lab Results: Basic Metabolic Panel:  Lab 03/17/12 1610 03/15/12 0625 03/14/12 1939 03/12/12 2030  NA 137 142 -- --  K 3.3* 3.8 -- --  CL 101 107 -- --  CO2 26 25 -- --  GLUCOSE 250* 136* -- --  BUN 4* 3* -- --  CREATININE 0.55 0.53 -- --  CALCIUM 9.8 8.7 -- --  MG -- -- 1.7 1.6  PHOS -- -- -- --   Liver Function Tests:  Lab 03/15/12 0625 03/13/12 0318  AST 15 15  ALT 9 11  ALKPHOS 74 80  BILITOT 0.4 0.5  PROT 6.8 7.2  ALBUMIN 3.2* 3.5    Lab 03/14/12 1939 03/12/12 1448  LIPASE 57 87*  AMYLASE -- --   CBC:  Lab 03/15/12 0625 03/13/12 0318 03/12/12 1448  WBC 6.6 8.3 --  NEUTROABS -- -- 5.0  HGB 11.3* 11.8* --  HCT 32.8* 35.0* --  MCV 77.7* 78.0 --    PLT 378 402* --   Cardiac Enzymes:  Lab 03/13/12 1115  CKTOTAL --  CKMB --  CKMBINDEX --  TROPONINI <0.30   CBG:  Lab 03/17/12 1149 03/17/12 0757 03/17/12 0430 03/17/12 0013 03/16/12 2003 03/16/12 1655  GLUCAP 177* 178* 201* 75 203* 137*   Drugs of Abuse     Component Value Date/Time   LABOPIA POSITIVE* 02/28/2012 1422   LABOPIA NEG 08/30/2011 1526   COCAINSCRNUR NONE DETECTED 02/28/2012 1422   COCAINSCRNUR NEG 08/30/2011 1526   LABBENZ NONE DETECTED 02/28/2012 1422   LABBENZ NEG 08/30/2011 1526   LABBENZ NEG 01/24/2011 1414   AMPHETMU NONE DETECTED 02/28/2012 1422   AMPHETMU NEG 01/24/2011 1414   THCU NONE DETECTED 02/28/2012 1422   LABBARB NONE DETECTED 02/28/2012 1422   LABBARB NEG 08/30/2011 1526    Urinalysis:  Lab 03/13/12 0149 03/12/12 1436  COLORURINE YELLOW YELLOW  LABSPEC 1.008 1.012  PHURINE 5.0 6.0  GLUCOSEU NEGATIVE 250*  HGBUR NEGATIVE NEG  BILIRUBINUR NEGATIVE NEG  KETONESUR NEGATIVE NEG  PROTEINUR NEGATIVE NEG  UROBILINOGEN 0.2 0.2  NITRITE NEGATIVE NEG  LEUKOCYTESUR SMALL* NEG    Micro Results: Recent Results (from the past 240 hour(s))  URINE CULTURE     Status: Normal   Collection Time   03/13/12  1:49 AM      Component Value Range Status Comment   Specimen Description URINE, CLEAN CATCH   Final    Special Requests CX ADDED AT 0217 ON 161096   Final    Culture  Setup Time 03/13/2012 08:50   Final    Colony Count 9,000 COLONIES/ML   Final    Culture INSIGNIFICANT GROWTH   Final    Report Status 03/14/2012 FINAL   Final    Studies/Results: Nm Gastric Emptying  03/16/2012  *RADIOLOGY REPORT*  Clinical data: Chronic postprandial abdominal pain, pancreas divisum, nausea, bloating, diabetes  NUCLEAR MEDICINE GASTRIC EMPTYING EXAM:  Radiopharmaceutical:  2 mCi Tc-56m sulfur colloid labeled egg whites  Technique: The patient ingested a standardized meal containing radiolabeled egg whites. Imaging was performed in the anterior projection for 120  minutes. Gastric emptying is calculated from the obtained images.  Findings: Subjectively, normal emptying of tracer from the stomach is identified during 2 hours of imaging after ingestion of the standardized meal.  Quantitative analysis reveals 86% emptying at 2 hours.  Findings represent normal gastric emptying.  IMPRESSION: Normal gastric emptying.   Original Report Authenticated By: Ulyses Southward, M.D.    Medications: I have reviewed the patient's current medications. Scheduled Meds:    . enoxaparin (LOVENOX) injection  40 mg Subcutaneous Q24H  . lisinopril  20 mg Oral Daily   And  . hydrochlorothiazide  12.5 mg Oral Daily  . insulin aspart  0-9 Units Subcutaneous Q4H  . pantoprazole (PROTONIX) IV  40 mg Intravenous Q24H  . polyethylene glycol  17 g Oral Daily  . [COMPLETED] potassium chloride  40 mEq Oral Once   Continuous Infusions:    . sodium chloride 150 mL/hr at 03/17/12 0951   PRN Meds:.glucose-Vitamin C, HYDROmorphone (DILAUDID) injection, oxyCODONE, promethazine, promethazine Assessment/Plan: [name] is a 56 yo female with a history of pancreas divisum s/p stenting, type 2 DM, HTN, recently discharged from the teaching service for abdominal pain/N, who presents as a direct admission from clinic with worsening abdominal pain, diarrhea, and nausea.   #. Acute on chronic abdominal pain: Etiology unclear. This was thought to be most likely due to flare up of pancreatitis however her lipase was normal (no elevated 3 times the UL of normal). She had been just discharged from the hospital after being treated for the same problem. According to her GI specialist at [facility], Dr. Gery Pray, she does have pancreatic divisum but it does not explain her abdominal pain. Per Dr. Gery Pray, the patient has had extensive imaging, including multiple CT of her abdomen, MRCP, and EUS/EGD (10/05/11), and colonoscopy in January; all unremarkable.  She had stents placed in 2011 that were later removed  due to worsening of her symptoms. Per Dr. Gery Pray, patient has never met criteria for acute pancreatitis with no lipase elevated to three times the upper level of normal or CT findings consistent with acute or chronic pancreatitis. Here at the [facility] system, [name] has one lipase level of 197 on 08/2009 meeting the criteria for one episode of acute pancreatitis. She has had multiple admissions for acute pancreatitis since then but never with a lipase level to support this diagnosis. Patient had negative gastric emptying study on 03/16/12.  She developed diarrhea which she contribute to the  egg which she took with oral contrast for gastric emptying study. Currently she looks dry, but no tachycardia. Afebrile. No leukocytosis. BMP showed hypokalimia (K=3.3).  -Will continue full liquid diet as tolerated.  -Will give IVF NS 150 cc/h for 10 hours. -will check C diff PCR. -Dilaudid 0.5mg  q3 hr for pain  -Oxycodone 5mg  q 4hr PRN for pain  -Phenergan for nausea.  -Pt will follow up with Dr. Gery Pray, GI specialist at [facility], in January  # Elevated anion gap: Resolved on 03/11/12. AG of 18 on admission. She does not seem to have typical DKA, since her bicarbonate is 23 with CBG of 141 and normal-low potassium. Patient has been on metformin at home for her diabetes. Due to severe nausea vomiting and diarrhea, patient may have metformin-induced lactic acidosis. Her albumin level is normal at 4.1, ruling out the possibility of paraprotein related AG elevation. She denies any history of intoxication or ingestions. UA negative for ketones.   -F/u BMET   #: Hypokalemia: Potassium was 3.3 today. This was most likely due to diarrhea.  -Will replete K as needed  -Check Mg  # Type 2 diabetes mellitus: Last A1c was 8.4 on 02/27/12. She has been taking metformin at home, which was d/c'ed on admission  -continue SSI.  #. Hypertension: Blood pressure well controlled today.  -Continue home Prinzide.   #  DVT PPx: lovenox.   Dispo: Disposition is deferred at this time, awaiting improvement of current medical problems.  Anticipated discharge in approximately 1 day.   The patient does have current PCP (KALIA-REYNOLDS, SHELLY, DO), therefore will require OPC follow-up after discharge.   The patient does have transportation limitations that hinder transportation to clinic appointments.  No services needed at her discharge.     LOS: 5 days   Lorretta Harp 03/17/2012, 12:32 PM

## 2012-03-17 NOTE — Progress Notes (Signed)
PHARMACIST - PHYSICIAN COMMUNICATION DR:   Kem Kays CONCERNING: Protonix IV to Oral Route Change Policy  RECOMMENDATION: This patient is receiving Protonix by the intravenous route.  Based on criteria approved by the Pharmacy and Therapeutics Committee, this drug is being converted to the equivalent oral dose form(s).  DESCRIPTION: These criteria include:  The patient is eating (either orally or via tube) and/or has been taking other orally administered medications for a least 24 hours  There is no active GI bleed or impaired GI absorption noted.   If you have questions about this conversion, please contact the Pharmacy Department  []   [phone] )  [facility] [x]   [phone] )  [facility]  []   [phone] )  [facility] []   [phone] )  [facility]    Noted normal GES on 11/15  Georgina Pillion, PharmD, BCPS 03/17/2012 1:53 PM

## 2012-03-17 NOTE — Discharge Summary (Signed)
Internal Medicine Teaching [facility] Discharge Note  Name: [name] MRN: [id number] DOB: 10/06/1955 56 y.o.  Date of Admission: 02/27/2012  7:52 AM Date of Discharge: 03/03/2012 Attending Physician: Dr. Criselda Peaches  Discharge Diagnosis: Principal Problem:  *Abdominal  pain, other specified site Active Problems:  DIABETES MELLITUS, TYPE II  HYPERTENSION  PANCREAS DIVISUM  Chronic abdominal pain   Discharge Medications:   Medication List     As of 03/17/2012  8:40 PM    STOP taking these medications         calcium carbonate 500 MG chewable tablet   Commonly known as: TUMS - dosed in mg elemental calcium      IMODIUM PO      lipase/protease/amylase 91478 UNITS Cpep   Commonly known as: CREON-10/PANCREASE      metFORMIN 1000 MG tablet   Commonly known as: GLUCOPHAGE      MOTRIN PM 200-38 MG Tabs   Generic drug: Ibuprofen-Diphenhydramine Cit      promethazine 25 MG tablet   Commonly known as: PHENERGAN        Disposition and follow-up:   [name] was discharged from [facility] in stable condition.  At the hospital follow up visit please address resolution of her abdominal pain, nausea and vomiting and increased intake per mouth  Follow-up Appointments:     Follow-up Information    Schedule an appointment as soon as possible for a visit with KALIA-REYNOLDS, SHELLY, DO. (You also need a LAB visit withing one week to recheck your potassium)    Contact information:   [address] [place] [address] [phone]         Discharge Orders    Future Appointments: Provider: Department: Dept Phone: Center:   03/21/2012 10:45 AM Sunday Spillers, MD [facility] [phone] [facility]     Future Orders Please Complete By Expires   Diet - low sodium heart healthy      Increase activity slowly         Consultations: None  Procedures Performed: None, no imaging   Admission HPI:  56 year old woman  with past medical history significant for pancreatic divisum status post stents( placement in 2011 and removal in 07/2010) follows with Dr. Allyson Sabal at [facility], type 2 diabetes mellitus, hypertension presents to ER with abdominal pain for 2-3 days.  Patient has a long-standing history of intermittent left upper quadrant pain. She was in her usual state of health until 3 days ago(Friday) when she started having severe abdominal pain. Her pain is very typical to her previous episodes - describes it as a sharp pain, located in the left upper quadrant radiating to her back. When she presented to the ER, she rated her pain 20 out of 10 but after a shot of pain medication she currently rates her pain 9/10. She took some Motrin at home but that did not relieve her symptoms and this morning, she decided to come to ER to seek some medical help. She also reports some nausea but denies any vomiting. She also reports some episodes of loose watery stools yesterday when she used the bathroom about 5-6 times. Denies any melena, fever, chills, chest pain, shortness of breath, headaches. Also denies any sick contacts in the family.  She states that she has not been taking her metformin for last 2 days with the fear of dropping her sugars low as she was not eating much.   Hospital Course by problem list: Presumed Acute Pancreatis  Secondary to Pancreatic Divisum: Her pain was initially controlled with Dilaudid 1mg  q4hr PRN for pain. The dose of Dilaudid was decreased to 0.5mg  q4hr PRN and she continued to have good pain control. Dilaudid was then discontinued and she was started on oxycodone 5mg  PO q4hr PRN for pain with good pain control. Her nausea was persistent at first, but responded to phenergan. She had no nausea on the day of her discharge. Her abdominal exam is positive for left upper quadrant tenderness but with no signs of peritonitis. Her vitals are stable. She has tolerated full liquids for the past two days and would  like to advance her diet slowly once she is discharged home. She will continued creon with meals upon her discharge as well as Miralax and docusated.   -Hypokalemia: K had improved initially after repletion but was ow again at 3.3 for two days and required repletion with  Oral, liquid. She will need a repeat BMET at her follow up visit.   Elevated anion gap (resolved): She was hypochloremic likely from dehydration that could explain mild anion gap of 15. The AG closed after hydration.   Type 2 diabetes mellitus: A1C on 10/28 is 8.8. Her blood sugars were well controlled since admission on SSI. Home metformin held during her hospitalization but refill of this prescription given upon her discharge for restart of this medications once her intake per mouth improves. She will wait until outpatient follow up for restart of this medication.     Hypertension: Blood pressure initially mildly elevated likely secondary to acute distress but stable just prior to her discharge. We continued her Lisinopril 20mg  daily.    Thalassemia: Her Hemoglobin was stable, with no acute changes during this admission.    Discharge Vitals:    03/03/12 0530   BP:  131/68   Pulse:  77   Temp:  98.2 F (36.8 C)   TempSrc:  Oral   Resp:  16   Height:    Weight:    SpO2:  100%    Weight change:   Intake/Output Summary (Last 24 hours) at 03/03/12 1159 Last data filed at 03/03/12 1032   Gross per 24 hour   Intake  2725 ml   Output  3200 ml   Net  -475 ml     Discharge Labs:  Basic Metabolic Panel:   Lab  03/03/12 0715  03/02/12 0536   NA  141  142   K  3.3*  3.6   CL  106  105   CO2  27  25   GLUCOSE  173*  117*   BUN  <3*  4*   CREATININE  0.57  0.54   CALCIUM  8.9  8.9   MG  --  --   PHOS  --  --    Liver Function Tests:   Lab  02/28/12 1005  02/27/12 0833   AST  15  32   ALT  10  16   ALKPHOS  84  119*   BILITOT  0.3  0.5   PROT  6.5  9.4*   ALBUMIN  3.1*  4.5     Lab  02/27/12 0833    LIPASE  57   AMYLASE  --    CBC:   Lab  03/03/12 0715  03/01/12 0550  02/27/12 0833   WBC  6.6  9.7  --   NEUTROABS  --  --  4.4   HGB  10.7*  10.9*  --  HCT  31.7*  31.8*  --   MCV  77.1*  78.5  --   PLT  347  366  --    CBG:   Lab  03/03/12 0752  03/03/12 0350  03/03/12 0017  03/02/12 1946  03/02/12 1648  03/02/12 1158   GLUCAP  181*  84  168*  197*  133*  165*    Hemoglobin A1C:   Lab  02/27/12 1408   HGBA1C  8.4*     Signed: Sara Chu D 03/17/2012, 8:40 PM   Time Spent on Discharge: 30 minutes Services Ordered on Discharge: None Equipment Ordered on Discharge: None

## 2012-03-18 LAB — BASIC METABOLIC PANEL
BUN: 3 mg/dL — ABNORMAL LOW (ref 6–23)
CO2: 25 mEq/L (ref 19–32)
Calcium: 9.1 mg/dL (ref 8.4–10.5)
Creatinine, Ser: 0.58 mg/dL (ref 0.50–1.10)
GFR calc Af Amer: >90 mL/min (ref >90)

## 2012-03-18 LAB — GLUCOSE, CAPILLARY
Glucose-Capillary: 42 mg/dL — CL (ref 70–99)
Glucose-Capillary: 151 mg/dL — ABNORMAL HIGH (ref 70–99)
Glucose-Capillary: 159 mg/dL — ABNORMAL HIGH (ref 70–99)

## 2012-03-18 LAB — MAGNESIUM: Magnesium: 1.5 mg/dL (ref 1.5–2.5)

## 2012-03-18 MED ORDER — METOCLOPRAMIDE HCL 5 MG PO TABS: 5.0000 mg | ORAL_TABLET | Freq: Three times a day (TID) | ORAL | Status: DC

## 2012-03-18 MED ORDER — POLYETHYLENE GLYCOL 3350 17 G PO PACK: 17.0000 g | PACK | Freq: Every day | ORAL | Status: DC

## 2012-03-18 MED ORDER — OXYCODONE HCL 5 MG PO TABS: 5.0000 mg | ORAL_TABLET | ORAL | Status: DC | PRN

## 2012-03-18 MED ORDER — PROMETHAZINE HCL 12.5 MG PO TABS: 12.5000 mg | ORAL_TABLET | Freq: Four times a day (QID) | ORAL | Status: DC | PRN

## 2012-03-18 MED ORDER — DOCUSATE SODIUM 100 MG PO CAPS: 100.0000 mg | ORAL_CAPSULE | Freq: Two times a day (BID) | ORAL | Status: DC

## 2012-03-18 NOTE — Progress Notes (Addendum)
Subjective: She reports no diarrhea since yesterday afternoon. She had one episode of hypoglycemia at 2300 with CBG of 42 that was corrected wit juice and came back up to 121.  Her abdominal pain is much better today. She had grits and ice cream this morning with no increased pain. She requests to go home today.   Objective: Vital signs in last 24 hours: Filed Vitals:   03/17/12 0951 03/17/12 1530 03/17/12 2134 03/18/12 0607  BP: 112/61 111/76 138/88 124/74  Pulse:  70 76 67  Temp:  98 F (36.7 C) 98.1 F (36.7 C) 98.6 F (37 C)  TempSrc:  Oral Oral Oral  Resp:  18 18 18   Height:      Weight:    125 lb 10.6 oz (57 kg)  SpO2:  99% 99% 97%   Weight change: 8 lb 5.8 oz (3.793 kg)  Intake/Output Summary (Last 24 hours) at 03/18/12 1022 Last data filed at 03/18/12 0955  Gross per 24 hour  Intake    120 ml  Output   1000 ml  Net   -880 ml   Vitals reviewed.  General: resting in bed, NAD  HEENT: no scleral icterus  Cardiac: RRR, no rubs, murmurs or gallops  Pulm: clear to auscultation bilaterally, no wheezes, rales, or rhonchi  Abd: soft, tenderness LUQ is improved today,nondistended, BS present  Ext: warm and well perfused, no pedal edema  Neuro: alert and oriented X3, cranial nerves II-XII grossly intact, strength and sensation to light touch equal in bilateral upper and lower extremities  Lab Results: Basic Metabolic Panel:  Lab 03/18/12 1610 03/17/12 1300 03/17/12 1001 03/14/12 1939  NA 140 -- 137 --  K 4.2 -- 3.3* --  CL 106 -- 101 --  CO2 25 -- 26 --  GLUCOSE 185* -- 250* --  BUN 3* -- 4* --  CREATININE 0.58 -- 0.55 --  CALCIUM 9.1 -- 9.8 --  MG -- 1.4* -- 1.7  PHOS -- -- -- --   Liver Function Tests:  Lab 03/15/12 0625 03/13/12 0318  AST 15 15  ALT 9 11  ALKPHOS 74 80  BILITOT 0.4 0.5  PROT 6.8 7.2  ALBUMIN 3.2* 3.5    Lab 03/14/12 1939 03/12/12 1448  LIPASE 57 87*  AMYLASE -- --   CBC:  Lab 03/15/12 0625 03/13/12 0318 03/12/12 1448  WBC 6.6 8.3  --  NEUTROABS -- -- 5.0  HGB 11.3* 11.8* --  HCT 32.8* 35.0* --  MCV 77.7* 78.0 --  PLT 378 402* --   Cardiac Enzymes:  Lab 03/13/12 1115  CKTOTAL --  CKMB --  CKMBINDEX --  TROPONINI <0.30   CBG:  Lab 03/18/12 0800 03/18/12 0357 03/18/12 0042 03/17/12 2343 03/17/12 2019 03/17/12 1654  GLUCAP 151* 179* 121* 42* 268* 107*   Drugs of Abuse     Component Value Date/Time   LABOPIA POSITIVE* 02/28/2012 1422   LABOPIA NEG 08/30/2011 1526   COCAINSCRNUR NONE DETECTED 02/28/2012 1422   COCAINSCRNUR NEG 08/30/2011 1526   LABBENZ NONE DETECTED 02/28/2012 1422   LABBENZ NEG 08/30/2011 1526   LABBENZ NEG 01/24/2011 1414   AMPHETMU NONE DETECTED 02/28/2012 1422   AMPHETMU NEG 01/24/2011 1414   THCU NONE DETECTED 02/28/2012 1422   LABBARB NONE DETECTED 02/28/2012 1422   LABBARB NEG 08/30/2011 1526    Urinalysis:  Lab 03/13/12 0149 03/12/12 1436  COLORURINE YELLOW YELLOW  LABSPEC 1.008 1.012  PHURINE 5.0 6.0  GLUCOSEU NEGATIVE 250*  HGBUR NEGATIVE NEG  BILIRUBINUR NEGATIVE NEG  KETONESUR NEGATIVE NEG  PROTEINUR NEGATIVE NEG  UROBILINOGEN 0.2 0.2  NITRITE NEGATIVE NEG  LEUKOCYTESUR SMALL* NEG   Micro Results: Recent Results (from the past 240 hour(s))  URINE CULTURE     Status: Normal   Collection Time   03/13/12  1:49 AM      Component Value Range Status Comment   Specimen Description URINE, CLEAN CATCH   Final    Special Requests CX ADDED AT 0217 ON 161096   Final    Culture  Setup Time 03/13/2012 08:50   Final    Colony Count 9,000 COLONIES/ML   Final    Culture INSIGNIFICANT GROWTH   Final    Report Status 03/14/2012 FINAL   Final   CLOSTRIDIUM DIFFICILE BY PCR     Status: Normal   Collection Time   03/17/12 12:21 PM      Component Value Range Status Comment   C difficile by pcr NEGATIVE  NEGATIVE Final    Studies/Results: Nm Gastric Emptying  03/16/2012  *RADIOLOGY REPORT*  Clinical data: Chronic postprandial abdominal pain, pancreas divisum, nausea, bloating,  diabetes  NUCLEAR MEDICINE GASTRIC EMPTYING EXAM:  Radiopharmaceutical:  2 mCi Tc-84m sulfur colloid labeled egg whites  Technique: The patient ingested a standardized meal containing radiolabeled egg whites. Imaging was performed in the anterior projection for 120 minutes. Gastric emptying is calculated from the obtained images.  Findings: Subjectively, normal emptying of tracer from the stomach is identified during 2 hours of imaging after ingestion of the standardized meal.  Quantitative analysis reveals 86% emptying at 2 hours.  Findings represent normal gastric emptying.  IMPRESSION: Normal gastric emptying.   Original Report Authenticated By: Ulyses Southward, M.D.    Medications: I have reviewed the patient's current medications. Scheduled Meds:   . enoxaparin (LOVENOX) injection  40 mg Subcutaneous Q24H  . lisinopril  20 mg Oral Daily   And  . hydrochlorothiazide  12.5 mg Oral Daily  . insulin aspart  0-9 Units Subcutaneous Q4H  . [COMPLETED] magnesium sulfate 1 - 4 g bolus IVPB  2 g Intravenous Once  . pantoprazole  40 mg Oral QAC supper  . polyethylene glycol  17 g Oral Daily  . [COMPLETED] potassium chloride  40 mEq Oral Once  . [DISCONTINUED] pantoprazole (PROTONIX) IV  40 mg Intravenous Q24H   Continuous Infusions:   . [EXPIRED] sodium chloride 150 mL/hr at 03/17/12 1735   PRN Meds:.glucose-Vitamin C, HYDROmorphone (DILAUDID) injection, oxyCODONE, promethazine, promethazine Assessment/Plan: [name] is a 56 yo female with a history of pancreas divisum s/p stenting, type 2 DM, HTN, recently discharged from the teaching service for abdominal pain/N, who presents as a direct admission from clinic with worsening abdominal pain, diarrhea, and nausea.   #. Acute on chronic abdominal pain: Etiology unclear. This was thought to be most likely due to flare up of pancreatitis however her lipase was normal (not elevated 3 times the UL of normal). She had been just discharged from the hospital  after being treated for the same problem. According to her GI specialist at [facility], Dr. Gery Pray, she does have pancreatic divisum but it does not explain her abdominal pain. Per Dr. Gery Pray, the patient has had extensive imaging, including multiple CT of her abdomen, MRCP, and EUS/EGD (10/05/11), and colonoscopy in January; all unremarkable. She had stents placed in 2011 that were later removed due to worsening of her symptoms. Per Dr. Gery Pray, patient has never met criteria for acute pancreatitis with no lipase  elevated to three times the upper level of normal or CT findings consistent with acute or chronic pancreatitis. Here at the [facility] system, [name] has one lipase level of 197 on 08/2009 meeting the criteria for one episode of acute pancreatitis. She has had multiple admissions for acute pancreatitis since then but never with a lipase level to support this diagnosis. We do not have a delayed gastric emptying study. She had a negative gastric emptying study on 03/16/12. She developed diarrhea which she attributed to  the egg which she took with oral contrast for gastric emptying study, she had hypokalimia (K=3.3). Her abdominal pain is much improved today and her diarrhea subsided yesterday afternoon.  -Dilaudid 0.5mg  q3 hr for pain  -Oxycodone 5mg  q 4hr PRN for pain  -Phenergan for nausea.  -Pt will follow up with Dr. Gery Pray, GI specialist at [facility], in January  -She will be discharged home with prescription for phenergan, oxycodone, omeprazole, and Reglan (with continued reassessment for the need of this medication in the [facility])  -Reglan: Alternative recommendations (unlabeled): Gastroparesis: Oral: Initial: 5 mg 3 times daily before meals. Dosage range: 5-10 mg 2-3 times daily before meals (maximum: 40 mg daily). Liquid formulation is preferred (to increase absorption) and the use of drug holidays or dose reductions (eg, 5 mg before the two main meals of the day) is also recommended when  clinically possible (Camilleri, 2013). Source: Uptodate    # Elevated anion gap: Resolved on 03/11/12. AG of 18 on admission. She does not seem to have typical DKA, since her bicarbonate is 23 with CBG of 141 and normal-low potassium. Patient has been on metformin at home for her diabetes. Due to severe nausea vomiting and diarrhea, patient may have metformin-induced lactic acidosis. Her albumin level is normal at 4.1, ruling out the possibility of paraprotein related AG elevation. She denies any history of intoxication or ingestions. UA negative for ketones.  -D/cd IV fluid  -Recheck BMET   #: Hypokalemia: Potassium was 3.3 on admission and was repleted, continuing to be normal until yesterday after multiple episodes of diarrhea (K of 3.3). Today she is s/p repletion of her K and Magnesium. Her potassium this morning is normal at 4.2. She will likely be discharged home today with instruction no to take Prinzide in the setting of vomiting, diarrhea, or no intake per mouth to decrease her risk of recurrent hypokalemia.   -will replete K as needed  -Recheck Mg, replete as needed  # Type 2 diabetes mellitus: Last A1c was 8.4 on 02/27/12. She has been taking metformin at home. She had CBG of 53 on the eve of 11/14, corrected to 91 but CBGs of 141 the next day requiring 1 unit of insulin aspart. She had another hypoglycemic episode on the eve of 11/16 with CBG of 43, corrected to 91. She will discharged home with no insulin and instruction to not take metformin until her intake per mouth is back to baseline--she will [facility] in the [facility] on 11/20. - Holding metformin  - Continue Sliding scale insulin   #. Hypertension: Blood pressure well controlled today.  -Continue home Prinzide.   # DVT PPx: lovenox.   Dispo: Disposition is deferred at this time, awaiting improvement of current medical problems.  Anticipated discharge in approximately in 1 day (today).   The patient does have current PCP  (KALIA-REYNOLDS, SHELLY, DO), therefore will require OPC follow-up after discharge.   The patient does not have transportation limitations that hinder transportation  to clinic appointments.  .Services Needed at time of discharge: Y = Yes, Blank = No PT: No  OT: No  RN: No  Equipment: No  Other: None    LOS: 6 days   Ky Barban 03/18/2012, 10:22 AM

## 2012-03-18 NOTE — Progress Notes (Signed)
Patient declined pneumonia vaccine.

## 2012-03-18 NOTE — Progress Notes (Signed)
NURSING PROGRESS NOTE  [name] [id number] Discharge Data: 03/18/2012 4:38 PM Attending Provider: No att. providers found WUJ:[id number], SHELLY, DO     [name] to be D/C'd Home per MD order.  Discussed with the patient the After Visit Summary and all questions fully answered. All IV's discontinued with no bleeding noted. All belongings returned to patient for patient to take home.   Last Vital Signs:  Blood pressure 127/71, pulse 78, temperature 98.3 F (36.8 C), temperature source Oral, resp. rate 18, height 5' (1.524 m), weight 57 kg (125 lb 10.6 oz), SpO2 98.00%.  Discharge Medication List   Medication List     As of 03/18/2012  4:38 PM    STOP taking these medications         calcium carbonate 500 MG chewable tablet   Commonly known as: TUMS - dosed in mg elemental calcium      loperamide 2 MG capsule   Commonly known as: IMODIUM      TAKE these medications         docusate sodium 100 MG capsule   Commonly known as: COLACE   Take 1 capsule (100 mg total) by mouth 2 (two) times daily.      lipase/protease/amylase 13086 UNITS Cpep   Commonly known as: CREON-10/PANCREASE   Take 1-2 capsules by mouth 4 (four) times daily. Take 2 capsules by mouth with meals, and 1 capsule by mouth with snacks.      lisinopril-hydrochlorothiazide 20-12.5 MG per tablet   Commonly known as: PRINZIDE,ZESTORETIC   Take 1 tablet by mouth at bedtime.      metFORMIN 1000 MG tablet   Commonly known as: GLUCOPHAGE   Take 1,000 mg by mouth 2 (two) times daily with a meal.      metoCLOPramide 5 MG tablet   Commonly known as: REGLAN   Take 1 tablet (5 mg total) by mouth 3 (three) times daily with meals.      oxyCODONE 5 MG immediate release tablet   Commonly known as: Oxy IR/ROXICODONE   Take 1 tablet (5 mg total) by mouth every 4 (four) hours as needed for pain.      polyethylene glycol packet   Commonly known as: MIRALAX / GLYCOLAX   Take 17 g by mouth daily.     promethazine 12.5 MG tablet   Commonly known as: PHENERGAN   Take 1 tablet (12.5 mg total) by mouth every 6 (six) hours as needed for nausea.        Keonte Daubenspeck, Elmarie Mainland, RN, MSN, [employer]

## 2012-03-18 NOTE — Progress Notes (Signed)
Patient CBG at midnight was 42mg /dl., given a cup of juice rechecked the blood sugar at about 0042 CBG was 121mg /dl.

## 2012-03-21 ENCOUNTER — Ambulatory Visit (INDEPENDENT_AMBULATORY_CARE_PROVIDER_SITE_OTHER): Payer: BC Managed Care – PPO | Admitting: Internal Medicine

## 2012-03-21 ENCOUNTER — Encounter: Payer: Self-pay | Admitting: Internal Medicine

## 2012-03-21 VITALS — BP 148/78 | HR 93 | Temp 96.5°F | Ht 60.0 in | Wt 121.2 lb

## 2012-03-21 DIAGNOSIS — K859 Acute pancreatitis without necrosis or infection, unspecified: Secondary | ICD-10-CM

## 2012-03-21 DIAGNOSIS — E119 Type 2 diabetes mellitus without complications: Secondary | ICD-10-CM

## 2012-03-21 DIAGNOSIS — R109 Unspecified abdominal pain: Secondary | ICD-10-CM

## 2012-03-21 DIAGNOSIS — G8929 Other chronic pain: Secondary | ICD-10-CM

## 2012-03-21 DIAGNOSIS — R110 Nausea: Secondary | ICD-10-CM

## 2012-03-21 LAB — GLUCOSE, CAPILLARY: Glucose-Capillary: 290 mg/dL — ABNORMAL HIGH (ref 70–99)

## 2012-03-21 NOTE — Assessment & Plan Note (Signed)
Much improved symptoms. Look for assessment and plan in chronic abdominal pain.

## 2012-03-21 NOTE — Discharge Summary (Signed)
Internal Medicine Teaching [facility] Discharge Note  Name: [name] MRN: [id number] DOB: 1955/05/17 56 y.o.  Date of Admission: 03/12/2012  5:39 PM Date of Discharge: 03/18/12 Attending Physician: Dr. Criselda Peaches  Discharge Diagnosis: Principal Problem:  *Pancreatitis Active Problems:  DIABETES MELLITUS, TYPE II  HYPERTENSION  PANCREAS DIVISUM   Discharge Medications:   Medication List     As of 03/21/2012  9:25 PM    STOP taking these medications         calcium carbonate 500 MG chewable tablet   Commonly known as: TUMS - dosed in mg elemental calcium      loperamide 2 MG capsule   Commonly known as: IMODIUM      TAKE these medications         docusate sodium 100 MG capsule   Commonly known as: COLACE   Take 1 capsule (100 mg total) by mouth 2 (two) times daily.      lipase/protease/amylase 82956 UNITS Cpep   Commonly known as: CREON-10/PANCREASE   Take 1-2 capsules by mouth 4 (four) times daily. Take 2 capsules by mouth with meals, and 1 capsule by mouth with snacks.      lisinopril-hydrochlorothiazide 20-12.5 MG per tablet   Commonly known as: PRINZIDE,ZESTORETIC   Take 1 tablet by mouth at bedtime.      metFORMIN 1000 MG tablet   Commonly known as: GLUCOPHAGE   Take 1,000 mg by mouth 2 (two) times daily with a meal.      metoCLOPramide 5 MG tablet   Commonly known as: REGLAN   Take 1 tablet (5 mg total) by mouth 3 (three) times daily with meals.      oxyCODONE 5 MG immediate release tablet   Commonly known as: Oxy IR/ROXICODONE   Take 1 tablet (5 mg total) by mouth every 4 (four) hours as needed for pain.      polyethylene glycol packet   Commonly known as: MIRALAX / GLYCOLAX   Take 17 g by mouth daily.      promethazine 12.5 MG tablet   Commonly known as: PHENERGAN   Take 1 tablet (12.5 mg total) by mouth every 6 (six) hours as needed for nausea.        Disposition and follow-up:   Ms.Rylah McKinnon was discharged from [facility] in stable condition.  At the hospital follow up visit please address resolution of her nausea and poor intake per mouth and improvement of her abdominal pain.  -She was instructed to not take metformin until she was able to resume her intake of fluids and foods to prevent lactic acidosis.  -She will need a follow up appointment for diabetes alone.  -She was given prescription for Reglan and instructed to take it only if she continued to be nauseated--her gastric emptying study was normal.  -She has made an appointment with Dr. Gery Pray at [facility] in January and will follow up with him as needed. She was advised to ask Dr. Gery Pray for possible referral to [facility] if she wants a second opinion about her abdominal pain   Follow-up Appointments:     Follow-up Information    Follow up with Lyn Hollingshead, MD. (November 20th, at 10:45AM)    Contact information:   [address] [place] [address] [phone]         Discharge Orders    Future Appointments: Provider: Department: Dept Phone: Center:   04/02/2012 2:00 PM Maitri Alexandria Lodge, DO [facility]  [phone] [facility]     Future Orders Please Complete By Expires   Diet - low sodium heart healthy      Increase activity slowly         Consultations: None  Procedures Performed:  Nm Gastric Emptying  03/16/2012  *RADIOLOGY REPORT*  Clinical data: Chronic postprandial abdominal pain, pancreas divisum, nausea, bloating, diabetes  NUCLEAR MEDICINE GASTRIC EMPTYING EXAM:  Radiopharmaceutical:  2 mCi Tc-63m sulfur colloid labeled egg whites  Technique: The patient ingested a standardized meal containing radiolabeled egg whites. Imaging was performed in the anterior projection for 120 minutes. Gastric emptying is calculated from the obtained images.  Findings: Subjectively, normal emptying of tracer from the stomach is identified during 2 hours of imaging after ingestion of the standardized meal.   Quantitative analysis reveals 86% emptying at 2 hours.  Findings represent normal gastric emptying.  IMPRESSION: Normal gastric emptying.   Original Report Authenticated By: Ulyses Southward, M.D.     Admission HPI:  [name] is a 56 yo female with a history of pancreas divisum s/p stenting, type 2 DM, HTN, recently discharged from the teaching service for abdominal pain/N, who presents as a direct admission from clinic with worsening abdominal pain, diarrhea, and nausea. She states that she had felt better after discharge and was starting to resume regular eating, when she began to feel lousy over the weekend. She experience "stabbing" LUQ pain radiating to the back ("20 out of 10") and nausea as well as diarrhea. She had had loose bowel movements up to 6 times per day. She also remembers seeing blood in the toilet bowel after a BM in the past few days.  She denies fever, chills, SOB, chest pain, dysuria, vomiting.   Hospital Course by problem list: #. Acute on chronic abdominal pain: Etiology unclear. This was thought to be most likely due to flare up of pancreatitis however her lipase was normal (not elevated to 3 times the UL of normal). She had been just discharged from the hospital after being treated for the same problem. According to her GI specialist at [facility], Dr. Gery Pray, she does have pancreatic divisum but it does not explain her abdominal pain. Per Dr. Gery Pray, the patient has had extensive imaging, including multiple CTs of her abdomen, MRCP, EUS/EGD (10/05/11), and colonoscopy in January; all unremarkable. She had stents placed in 2011 that were later removed due to worsening of her symptoms. Per Dr. Gery Pray, [name] has never met criteria for acute pancreatitis with no lipase elevated to three times the upper level of normal or CT findings consistent with acute or chronic pancreatitis. Here at the [facility] system, [name] has one lipase level of 197 on 08/2009 meeting the criteria  for one episode of acute pancreatitis. Per Dr. Gery Pray, [name] has never been evaluated by psychiatry for her abdominal pain. Per Psychologist here at [facility] may offer the patient psychiatry evaluation for her abdominal pain in their GI/chronic abdominal pain clinic.  Her pain was well controlled with Dilaudid 0.5mg  q4h PRN for pain and Oxycodone 5mg  q4hr PRN for pain. She had good improvement of her nausea with phenergan. She has had multiple admissions for acute pancreatitis since then but never with a lipase level to support this diagnosis. We did not have a delayed gastric emptying study. She had an unremarkable  gastric emptying study on 03/16/12. She developed diarrhea which she attributed to the egg which she took with oral contrast for gastric emptying study, she had  hypokelemia(K=3.3) that was corrected with potassium repletion. Her abdominal pain was much improved on the day of her discharge and her diarrhea subsided the day prior to her discharge.   -She will be discharged home with prescription for phenergan, oxycodone, omeprazole, and Reglan (with continued reassessment for the need of this medication in the [facility])  -Reglan: Alternative recommendations (unlabeled): Gastroparesis: Oral: Initial: 5 mg 3 times daily before meals. Dosage range: 5-10 mg 2-3 times daily before meals (maximum: 40 mg daily). Liquid formulation is preferred (to increase absorption) and the use of drug holidays or dose reductions (eg, 5 mg before the two main meals of the day) is also recommended when clinically possible (Camilleri, 2013). Source: Uptodate   # Elevated anion gap: Resolved on 03/12/12. AG of 18 on admission, not consistent with DKA, since her bicarbonate was 23 with CBG of 141 and normal-low potassium. She had been on metformin at home for her diabetes. Due to severe nausea vomiting and diarrhea, she was thought to have metformin-induced lactic acidosis. Her albumin level is normal at 4.1, ruling out  the possibility of paraprotein related AG elevation. She denies any history of intoxication or ingestions. Her UA was negative for ketones. She received IV fluids and her anion gap resolved the same day of her admission.   #: Hypokalemia: Potassium was 3.3 on admission and was repleted, continuing to be normal until the day prior to her discharge secondary to multiple episodes of diarrhea (K of 3.3). She also required Magnesium repletion.  On the day of her discharge she is s/p repletion of her K and Magnesium. Her potassium this morning is normal at 4.2. She will likely be discharged home today with instruction no to take Prinzide in the setting of vomiting, diarrhea, or no intake per mouth to decrease her risk of recurrent hypokalemia.   # Type 2 diabetes mellitus: Last A1c was 8.4 on 02/27/12. She has been taking metformin at home but this medication was held secondary to concerns for worsening lactic acidosis. She was placed on a SSI.  She had CBG of 53 on the eve of 11/14, corrected to 91 but CBGs of 141 the next day requiring 1 unit of insulin aspart. She had another hypoglycemic episode on the eve of 11/16 with CBG of 43, corrected to 91. She will be discharged home with no insulin and instruction to not take metformin until her intake per mouth is back to baseline--she will follow up in the [facility] and will resume taking metformin at that time after being reassessed.   #. Hypertension: We continued her home Prinzide once she was rehydrated and her blood pressure was well controlled.     Discharge Vitals:  BP 127/71  Pulse 78  Temp 98.3 F (36.8 C) (Oral)  Resp 18  Ht 5' (1.524 m)  Wt 125 lb 10.6 oz (57 kg)  BMI 24.54 kg/m2  SpO2 98%  Discharge Labs:  Basic Metabolic Panel:   Lab  03/13/12 0318  03/12/12 2030   NA  138  137   K  3.9  3.2*   CL  104  99   CO2  23  24   GLUCOSE  167*  149*   BUN  8  9   CREATININE  0.56  0.54   CALCIUM  9.0  9.9   MG  --  1.6   PHOS  --  --     Liver Function Tests:   Lab  03/13/12 0318  03/12/12 1448  AST  15  16   ALT  11  13   ALKPHOS  80  91   BILITOT  0.5  0.4   PROT  7.2  8.7*   ALBUMIN  3.5  4.1     Lab  03/12/12 1448   LIPASE  87*   AMYLASE  --    CBC:   Lab  03/13/12 0318  03/12/12 1448   WBC  8.3  10.4   NEUTROABS  --  5.0   HGB  11.8*  13.3   HCT  35.0*  38.7   MCV  78.0  77.4*   PLT  402*  454*    Cardiac Enzymes:   Lab  03/13/12 1115   CKTOTAL  --   CKMB  --   CKMBINDEX  --   TROPONINI  <0.30    CBG:   Lab  03/14/12 0323  03/13/12 2352  03/13/12 2000  03/13/12 1719  03/13/12 1157  03/13/12 0737   GLUCAP  115*  129*  116*  104*  125*  106*    Urine Drug Screen:  Drugs of Abuse    Component  Value  Date/Time    LABOPIA  POSITIVE*  02/28/2012 1422    LABOPIA  NEG  08/30/2011 1526    COCAINSCRNUR  NONE DETECTED  02/28/2012 1422    COCAINSCRNUR  NEG  08/30/2011 1526    LABBENZ  NONE DETECTED  02/28/2012 1422    LABBENZ  NEG  08/30/2011 1526    LABBENZ  NEG  01/24/2011 1414    AMPHETMU  NONE DETECTED  02/28/2012 1422    AMPHETMU  NEG  01/24/2011 1414    THCU  NONE DETECTED  02/28/2012 1422    LABBARB  NONE DETECTED  02/28/2012 1422    LABBARB  NEG  08/30/2011 1526    Urinalysis:   Lab  03/13/12 0149  03/12/12 1436   COLORURINE  YELLOW  YELLOW   LABSPEC  1.008  1.012   PHURINE  5.0  6.0   GLUCOSEU  NEGATIVE  250*   HGBUR  NEGATIVE  NEG   BILIRUBINUR  NEGATIVE  NEG   KETONESUR  NEGATIVE  NEG   PROTEINUR  NEGATIVE  NEG   UROBILINOGEN  0.2  0.2   NITRITE  NEGATIVE  NEG   LEUKOCYTESUR  SMALL*  NEG    Micro Results:  Recent Results (from the past 240 hour(s))   URINE CULTURE Status: Normal    Collection Time    03/13/12 1:49 AM   Component  Value  Range  Status  Comment    Specimen Description  URINE, CLEAN CATCH   Final     Special Requests  CX ADDED AT 0217 ON [id number]   Final     Culture Setup Time  03/13/2012 08:50   Final     Colony Count  9,000 COLONIES/ML   Final      Culture  INSIGNIFICANT GROWTH   Final     Report Status  03/14/2012 FINAL   Final       Signed: Sara Chu D 03/20/12, 9:00PM  Time Spent on Discharge: 40 minutes Services Ordered on Discharge: None Equipment Ordered on Discharge: None

## 2012-03-21 NOTE — Progress Notes (Addendum)
  Subjective:    Patient ID: [name], female    DOB: 1956/04/06, 56 y.o.   MRN: [id number]  HPI patient is a 56 year old woman with DM 2, hypertension, hyperlipidemia, chronic abdominal pain likely secondary to pancreatitis who comes the clinic for hospital followup visit. She was admitted in hospital recently for possible acute exacerbation of her chronic pancreatitis. This was her second admission in past 20 days for same issue.  Called discharging resident for followup instructions as patient asked that she was supposed to hold metformin until she sees me and I had to reevaluate to see if she needs metoclopramide.  She was appropriately hydrated and treated and discharged home with metoclopramide to help her nausea. Patient was advised to start metformin once she starts eating and continue Reglan if it's working. I continued both as patient started eating since yesterday and also her nausea and vomiting are much better. She reports improvement in her overall symptoms including abdominal pain- although she still does mild abdominal pain.  She says she has an appointment with Dr. Allyson Sabal with GI. And she is also supposed to see a specialist at [facility].  She denies any fever, chills, vomiting, diarrhea, headache, palpitations, chest pain.   Review of Systems    as per history of present illness.  Objective:   Physical Exam  General: NAD HEENT: PERRL, EOMI, no scleral icterus Cardiac: S1, S2, RRR, no rubs, murmurs or gallops Pulm: clear to auscultation bilaterally, moving normal volumes of air Abd: soft, mildly tender in epigastric region, nondistended, BS present Ext: warm and well perfused, no pedal edema Neuro: alert and oriented X3, cranial nerves II-XII grossly intact       Assessment & Plan:

## 2012-03-21 NOTE — Assessment & Plan Note (Signed)
She was started on Reglan while in hospital. She has been tried on multiple things for nausea and vomiting without much success per discharging resident. I will continue Reglan for now and reevaluate her next visit.

## 2012-03-21 NOTE — Patient Instructions (Addendum)
Please make a followup appointment with primary Dr. as soon as possible.  Continue taking metoclopramide (Reglan) as instructed until next visit and then will reevaluate the need to continue that.  Start taking metformin as prescribed.  Continue taking all other medications as you are supposed to.

## 2012-03-21 NOTE — Assessment & Plan Note (Signed)
Patient clinically better although not back to baseline. Unclear etiology of patient's chronic abdominal pain. Patient has multiple worsening episodes.  She is to followup with her GI specialist and also at [facility]. Meanwhile will do symptomatic management. Continue oxycodone. She still has some medications left at home.

## 2012-03-21 NOTE — Assessment & Plan Note (Signed)
Patient's last A1c 8.4  on  02/27/2012. She was advised to hold metformin until she comes to clinic visit and reevaluate if she is tolerating diet. She started eating soup yesterday and is keeping it down. I advised her to start metformin back.

## 2012-03-23 NOTE — Discharge Summary (Signed)
Internal Medicine Teaching Service Attending Note Date: 03/23/2012  Patient name: [name]  Medical record number: [id number]  Date of birth: 1955-10-25    I was covering on call on the day of discharge. I discussed the discharge plan with my resident team. I agree with the discharge documentation and disposition.  Thank you for working with me on this patient's care.   Thanks Aletta Edouard 03/23/2012, 10:58 AM

## 2012-03-26 ENCOUNTER — Other Ambulatory Visit: Payer: Self-pay | Admitting: *Deleted

## 2012-03-27 ENCOUNTER — Ambulatory Visit (INDEPENDENT_AMBULATORY_CARE_PROVIDER_SITE_OTHER): Payer: BC Managed Care – PPO | Admitting: Internal Medicine

## 2012-03-27 VITALS — BP 158/99 | HR 107 | Temp 98.7°F | Ht 60.0 in | Wt 116.5 lb

## 2012-03-27 DIAGNOSIS — G8929 Other chronic pain: Secondary | ICD-10-CM

## 2012-03-27 DIAGNOSIS — R109 Unspecified abdominal pain: Secondary | ICD-10-CM

## 2012-03-27 MED ORDER — OXYCODONE HCL 5 MG PO TABS: 5.0000 mg | ORAL_TABLET | Freq: Four times a day (QID) | ORAL | Status: DC | PRN

## 2012-03-27 MED ORDER — PROMETHAZINE HCL 12.5 MG PO TABS: 12.5000 mg | ORAL_TABLET | Freq: Four times a day (QID) | ORAL | Status: DC | PRN

## 2012-03-27 NOTE — Patient Instructions (Addendum)
Please take pain medications and nausea medications as prescribed.   If you continue to see blood in your diarrhea, please call the clinic to make an appointment. If there is a large amount of blood, please call 911.  It was a pleasure seeing you at the outpatient clinic today.   Acute Pancreatitis Acute pancreatitis is a disease in which the pancreas becomes suddenly inflamed. The pancreas is a large gland located behind your stomach. The pancreas produces enzymes that help digest food. The pancreas also releases the hormones glucagon and insulin that help regulate blood sugar. Damage to the pancreas occurs when the digestive enzymes from the pancreas are activated and begin attacking the pancreas before being released into the intestine. Most acute attacks last a couple of days and can cause serious complications. Some people become dehydrated and develop low blood pressure. In severe cases, bleeding into the pancreas can lead to shock and can be life-threatening. The lungs, heart, and kidneys may fail. CAUSES  Pancreatitis can happen to anyone. In some cases, the cause is unknown. Most cases are caused by:  Alcohol abuse.  Gallstones. Other less common causes are:  Certain medicines.  Exposure to certain chemicals.  Infection.  Damage caused by an accident (trauma).  Abdominal surgery. SYMPTOMS   Pain in the upper abdomen that may radiate to the back.  Tenderness and swelling of the abdomen.  Nausea and vomiting. DIAGNOSIS  Your caregiver will perform a physical exam. Blood and stool tests may be done to confirm the diagnosis. Imaging tests may also be done, such as X-rays, CT scans, or an ultrasound of the abdomen. TREATMENT  Treatment usually requires a stay in the hospital. Treatment may include:  Pain medicine.  Fluid replacement through an intravenous line (IV).  Placing a tube in the stomach to remove stomach contents and control vomiting.  Not eating for 3 or 4  days. This gives your pancreas a rest, because enzymes are not being produced that can cause further damage.  Antibiotic medicines if your condition is caused by an infection.  Surgery of the pancreas or gallbladder. HOME CARE INSTRUCTIONS   Follow the diet advised by your caregiver. This may involve avoiding alcohol and decreasing the amount of fat in your diet.  Eat smaller, more frequent meals. This reduces the amount of digestive juices the pancreas produces.  Drink enough fluids to keep your urine clear or pale yellow.  Only take over-the-counter or prescription medicines as directed by your caregiver.  Avoid drinking alcohol if it caused your condition.  Do not smoke.  Get plenty of rest.  Check your blood sugar at home as directed by your caregiver.  Keep all follow-up appointments as directed by your caregiver. SEEK MEDICAL CARE IF:   You do not recover as quickly as expected.  You develop new or worsening symptoms.  You have persistent pain, weakness, or nausea.  You recover and then have another episode of pain. SEEK IMMEDIATE MEDICAL CARE IF:   You are unable to eat or keep fluids down.  Your pain becomes severe.  You have a fever or persistent symptoms for more than 2 to 3 days.  You have a fever and your symptoms suddenly get worse.  Your skin or the white part of your eyes turn yellow (jaundice).  You develop vomiting.  You feel dizzy, or you faint.  Your blood sugar is high (over 300 mg/dL). MAKE SURE YOU:   Understand these instructions.  Will watch your condition.  Will get help right away if you are not doing well or get worse. Document Released: 04/18/2005 Document Revised: 10/18/2011 Document Reviewed: 07/28/2011 [facility] Patient Information 2013 [place], [place].

## 2012-03-27 NOTE — Progress Notes (Signed)
Subjective:     Patient ID: [name], female   DOB: 1956-01-07, 56 y.o.   MRN: [id number]  HPI 56 yo female with a recent discharge from the hospital for abdominal pain 2/2 pancreatitis and presents to the clinic because of continued abdominal pain and wanted a refill of her pain medications. She is on oxycodone for pain and promethazine for nausea. The medications were working well, with no side effects. The patient ran of the medication on Sunday, and her pain was well controlled while she was on her medication.   Patient has a known history of pancreatic divisum status post stents (in 2011 and removal in 2012). She has been seen at [facility], admitted at [facility] for pancreatitis 2 times in the past month and is being referred to a specialist at [facility].    Past Medical History  Diagnosis Date  . Hypertension   . HLD (hyperlipidemia) 2009  . Sickle cell trait   . Pulmonary embolus 08/2004    Two areas of V/Q mismatch. Findings compatible  with high probability for pulmonary embolus.; pt was on coumadin for 1 year  . Pancreatic divisum     S/P ERCP with stenting 07/2010 at [facility], then stent removal (08/05/2010)  . CVA (cerebral infarction) 1993    Per report by patient she had a stroke and prolonged rehab course eventually resolving her right sided weakness, as per her hx obtained 08/2007  . Blood dyscrasia     thalasemia, sickle cell trait  . Chronic abdominal pain     Unclear etiology, thought to be due to chronic pancreatitis previously in setting of her pancreatic divisum - however, EUS and EGD performed [facility]  (10/05/2011) showing normal esophageall, gastric, duodenal mucosa. EUS showing no pancreatic masses, cysts, or changes of chronic pancreatitis. Biliary system nondilated and had no endosonographic abnormalities.  . Type II diabetes mellitus 2010    well controlled  . Asthma     childhood asthma  . Pneumonia     "when I was a kid" (02/27/2012)    . Beta thalassemia   . Migraine headache   . Seizures 1993    "first year after my stroke" (02/27/2012)  . Stroke 1993    very small amount of left sided weakness  . Uterine cancer    Past Surgical History  Procedure Date  . Pancreatic stent placement/removal     placed in 2011; removed in 2012/H&P (02/27/2012)  . Abdominal hysterectomy 1980    2/2 endometriosis  . Breast lumpectomy 1980's    bilaterally; "in my milk ducts; both benign" (02/27/2012)   History   Social History  . Marital Status: Married    Spouse Name: N/A    Number of Children: N/A  . Years of Education: N/A   Social History Main Topics  . Smoking status: Former Smoker -- 0.5 packs/day for 30 years    Types: Cigarettes    Quit date: 01/01/1999  . Smokeless tobacco: Never Used  . Alcohol Use: No  . Drug Use: No  . Sexually Active: None   Other Topics Concern  . None   Social History Narrative   Worked at [employer] as a Merchandiser, retail, on Health visitor about 9 hours/ day. Applying for disability.    Family History  Problem Relation Age of Onset  . Prostate cancer Father   . Cancer Father     stomach ca died x 1 year ago  . Sickle cell anemia Brother   .  Lung cancer Maternal Aunt   . Lung cancer Maternal Uncle   . Breast cancer Maternal Grandmother   . Heart disease Maternal Grandfather   . Heart disease Paternal Grandfather   . Diabetes Other   . Cancer Other     multiple cancers pancreas,colon, breast   Review of Systems  Constitutional: Negative for fever and chills.  HENT: Negative.   Eyes: Negative.   Respiratory: Negative.   Cardiovascular: Negative.   Gastrointestinal: Positive for nausea, abdominal pain, diarrhea (occasionally) and blood in stool (with diarrhea). Negative for vomiting.  Genitourinary: Negative.   Musculoskeletal: Positive for back pain (radiating back from abdomen). Negative for myalgias and joint swelling.  Skin: Negative.   Neurological: Negative.    Psychiatric/Behavioral: Negative.       Objective:   Physical Exam  Constitutional: She is oriented to person, place, and time. She appears well-developed. She appears distressed (pain).  HENT:  Head: Normocephalic.  Eyes: Pupils are equal, round, and reactive to light. Right eye exhibits no discharge. Left eye exhibits no discharge.  Neck: Normal range of motion. No tracheal deviation present.  Cardiovascular: Normal rate and normal heart sounds.  Exam reveals no gallop and no friction rub.   No murmur heard. Pulmonary/Chest: Effort normal. No respiratory distress. She has no wheezes. She has no rales.  Abdominal: Soft. Bowel sounds are normal. There is tenderness (left upper abdomen). There is no rebound and no guarding.  Musculoskeletal: Normal range of motion. She exhibits no edema and no tenderness.  Lymphadenopathy:    She has no cervical adenopathy.  Neurological: She is alert and oriented to person, place, and time.  Skin: No rash noted. She is not diaphoretic. No erythema. No pallor.  Psychiatric: She has a normal mood and affect.       Assessment/Plan:     Patient has a known history of pancreatic divisum status post stents (in 2011 and removal in 2012), and was recently discharged for pancreatitis, presents for refills of her pain and nausea medication. 1. Abdominal pain 2/2 chronic pancreatitis flare Patient has severe pain on left upper abdomen. Pancreatitis has been an on-going problem for this patient and she will be seeing a specialist in January at [facility] for further management. As for the blood seen in her diarrhea, she states that it was minimal blood and this has been an on-going problem as well. We advised her to check each bowel movement for blood, and a large amount of blood is seen, she should call 911. - Nausea: Promethazine 12.5mg  PO q6hrs - Pain: Oxycodone 5mg  PO q6hrs - Follow up visit next week.

## 2012-03-28 NOTE — Assessment & Plan Note (Signed)
Patient was given refills for her nausea and 20 tabs of oxycodone to used only at times of extreme pain. Patient will come back to clinic next week for a follow up appointment and was told to call clinic if she feels worse earlier. Patient agrees with the plan. I reviewed the chart including the FYI that no pain medications should be prescribed to the patient by Dr Thad Ranger but after reviewing the current medical situation and recent admissions in the hospital for acute pancreatitis, I feel that patient is in pain and it is reasonable to prescribe a short supply. There was a concern for diversion earlier which should be considered prior to considering long term pain medications.

## 2012-03-28 NOTE — Progress Notes (Signed)
  Subjective:    Patient ID: [name], female    DOB: 05-29-1955, 56 y.o.   MRN: [id number]  HPI  Ms [name] is here today for follow up of her abdominal pain. She was recently discharged from the hospital after a 7+ day stay for recurrent pancreatitis. Patient is eating well and her nausea is well controlled on current medications(phenergan). Patient is still complaining of abdominal pain which is 6-7/10. A/w some nausea which is controlled with medications but she ran out of it and needs refill. Patient's pain is somewhat relieved with pain medications but no other exacerbating or relieving factors noted. She is waiting to hear about her appointment at [facility] specialist.  Patient's BP is elevated likely due to acute pain that she is in.   Review of Systems  Constitutional: Negative for fever, activity change and appetite change.  HENT: Negative for sore throat.   Respiratory: Negative for cough and shortness of breath.   Cardiovascular: Negative for chest pain and leg swelling.  Gastrointestinal: Positive for nausea, vomiting and abdominal pain. Negative for diarrhea, constipation and abdominal distention.  Genitourinary: Negative for frequency, hematuria and difficulty urinating.  Neurological: Negative for dizziness and headaches.  Psychiatric/Behavioral: Negative for suicidal ideas and behavioral problems. The patient is nervous/anxious.        Objective:   Physical Exam  Constitutional: She is oriented to person, place, and time. She appears well-developed and well-nourished. She appears distressed.  HENT:  Head: Normocephalic and atraumatic.  Eyes: Conjunctivae normal and EOM are normal. Pupils are equal, round, and reactive to light. No scleral icterus.  Neck: Normal range of motion. Neck supple. No JVD present. No thyromegaly present.  Cardiovascular: Normal rate, regular rhythm, normal heart sounds and intact distal pulses.  Exam reveals no gallop and no friction rub.     No murmur heard. Pulmonary/Chest: Effort normal and breath sounds normal. No respiratory distress. She has no wheezes. She has no rales.  Abdominal: Soft. Bowel sounds are normal. She exhibits no distension and no mass. There is tenderness (left upper quadrant). There is guarding. There is no rebound.  Musculoskeletal: Normal range of motion. She exhibits no edema and no tenderness.  Lymphadenopathy:    She has no cervical adenopathy.  Neurological: She is alert and oriented to person, place, and time.  Psychiatric: She has a normal mood and affect. Her behavior is normal.          Assessment & Plan:

## 2012-04-02 ENCOUNTER — Ambulatory Visit (INDEPENDENT_AMBULATORY_CARE_PROVIDER_SITE_OTHER): Payer: BC Managed Care – PPO | Admitting: Internal Medicine

## 2012-04-02 ENCOUNTER — Encounter: Payer: Self-pay | Admitting: Internal Medicine

## 2012-04-02 VITALS — BP 126/84 | HR 104 | Temp 96.8°F | Ht 60.0 in | Wt 118.9 lb

## 2012-04-02 DIAGNOSIS — E119 Type 2 diabetes mellitus without complications: Secondary | ICD-10-CM

## 2012-04-02 DIAGNOSIS — I10 Essential (primary) hypertension: Secondary | ICD-10-CM

## 2012-04-02 DIAGNOSIS — K219 Gastro-esophageal reflux disease without esophagitis: Secondary | ICD-10-CM

## 2012-04-02 DIAGNOSIS — Z1231 Encounter for screening mammogram for malignant neoplasm of breast: Secondary | ICD-10-CM

## 2012-04-02 DIAGNOSIS — G8929 Other chronic pain: Secondary | ICD-10-CM

## 2012-04-02 DIAGNOSIS — R109 Unspecified abdominal pain: Secondary | ICD-10-CM

## 2012-04-02 DIAGNOSIS — Z0000 Encounter for general adult medical examination without abnormal findings: Secondary | ICD-10-CM

## 2012-04-02 DIAGNOSIS — Z1239 Encounter for other screening for malignant neoplasm of breast: Secondary | ICD-10-CM

## 2012-04-02 DIAGNOSIS — R110 Nausea: Secondary | ICD-10-CM

## 2012-04-02 MED ORDER — TRAMADOL HCL 50 MG PO TABS: 50.0000 mg | ORAL_TABLET | Freq: Four times a day (QID) | ORAL | Status: DC | PRN

## 2012-04-02 MED ORDER — PROMETHAZINE HCL 12.5 MG PO TABS: 12.5000 mg | ORAL_TABLET | Freq: Four times a day (QID) | ORAL | Status: DC | PRN

## 2012-04-02 NOTE — Progress Notes (Signed)
Subjective:    Patient: [name]   Age/ Gender: 56 y.o., female   MRN: [id number]  DOB: [birth year]     HPI: [name] is a 56 y.o. with a PMHx of DM2 (A1c 8.4), chronic unexplained abdominal pain that is unclear etiology, who presented to clinic today for the following:  1) DMII (last A1c 8.4 in 01/2012) - Patient checking blood sugars 0 times daily. Currently taking metformin 1000 mg BID, misses infrequently, unless instructed by Dr. Denies polyuria, polydipsia, vomiting, blurriness of vision.  does not request refills today.  2) HTN - Patient does not check blood pressure regularly at home. Currently taking Lisinopril-HCTZ 20-12.5 mg daily. Patient misses doses 0 x per week on average. denies headaches, dizziness, lightheadedness, chest pain, shortness of breath.  does not request refills today   3) Chronic abdominal pain -  patient indicates that she continues to have intermittent episodes of sharp, left upper quadrant abdominal pain that radiates through to her back. She has been hospitalized for this chronic abd pain multiple times, most recently in [facility] in 03/2012, at which time the cause of her pain remained unclear. Lipase was wnl, gastric emptying study was wnl, porphobilinogen was wnl. Patient was most recently seen in clinic on 11/26, at which time acute worsening of her pain prompted allowance of her oxycodone refill.   Today, the patient states that her pain is stabilizing, although not resolved. She continues as well to have nausea without vomiting and She denies hematuria, dysuria, malodorous urine. Oral intake is stable at this time. No diarrhea, vomiting, fevers, chills.  She is very frustrated about not being able to get narcotics from our clinic, and adamantly does not feel like she ever violated her pain contract although documented multiple occasions.  4) Preventative Care - due for mammogram.   Review of Systems: Per HPI.   Current Outpatient  Medications: Medication Sig  . docusate sodium (COLACE) 100 MG capsule Take 1 capsule (100 mg total) by mouth 2 (two) times daily.  . lipase/protease/amylase (CREON-10/PANCREASE) 12000 UNITS CPEP Take 1-2 capsules by mouth 4 (four) times daily. Take 2 capsules by mouth with meals, and 1 capsule by mouth with snacks.  Marland Kitchen lisinopril-hydrochlorothiazide (PRINZIDE,ZESTORETIC) 20-12.5 MG per tablet Take 1 tablet by mouth at bedtime.  . metFORMIN (GLUCOPHAGE) 1000 MG tablet Take 1,000 mg by mouth 2 (two) times daily with a meal.  . metoCLOPramide (REGLAN) 5 MG tablet Take 1 tablet (5 mg total) by mouth 3 (three) times daily with meals.  Marland Kitchen oxyCODONE (OXY IR/ROXICODONE) 5 MG immediate release tablet Take 1 tablet (5 mg total) by mouth every 6 (six) hours as needed for pain.  . polyethylene glycol (MIRALAX / GLYCOLAX) packet Take 17 g by mouth daily.  . promethazine (PHENERGAN) 12.5 MG tablet Take 1 tablet (12.5 mg total) by mouth every 6 (six) hours as needed for nausea.     Allergies  Allergen Reactions  . Fioricet (Butalbital-Apap-Caffeine) Other (See Comments)    Put into a coma state; "it was actually fiorinol; same family" (02/27/2012)  . Shrimp Flavor Anaphylaxis    unknow  . Sulfonamide Derivatives Rash    Past Medical History  Diagnosis Date  . Hypertension   . HLD (hyperlipidemia) 2009  . Sickle cell trait   . Pulmonary embolus 08/2004    Two areas of V/Q mismatch. Findings compatible  with high probability for pulmonary embolus.; pt was on coumadin for 1 year  . Pancreatic divisum     S/P  ERCP with stenting 07/2010 at [facility], then stent removal (08/05/2010)  . CVA (cerebral infarction) 1993    Per report by patient she had a stroke and prolonged rehab course eventually resolving her right sided weakness, as per her hx obtained 08/2007  . Blood dyscrasia     thalasemia, sickle cell trait  . Chronic abdominal pain     Unclear etiology, thought to be due to chronic pancreatitis  previously in setting of her pancreatic divisum - however, EUS and EGD performed at [facility]  (10/05/2011) showing normal esophageall, gastric, duodenal mucosa. EUS showing no pancreatic masses, cysts, or changes of chronic pancreatitis. Biliary system nondilated and had no endosonographic abnormalities.  . Type II diabetes mellitus 2010    well controlled  . Asthma     childhood asthma  . Pneumonia     "when I was a kid" (02/27/2012)  . Beta thalassemia   . Migraine headache   . Seizures 1993    "first year after my stroke" (02/27/2012)  . Stroke 1993    very small amount of left sided weakness  . Uterine cancer     Past Surgical History  Procedure Date  . Pancreatic stent placement/removal     placed in 2011; removed in 2012/H&P (02/27/2012)  . Abdominal hysterectomy 1980    2/2 endometriosis  . Breast lumpectomy 1980's    bilaterally; "in my milk ducts; both benign" (02/27/2012)     Objective:    Physical Exam: Filed Vitals:   04/02/12 1415  BP: 126/84  Pulse: 104  Temp: 96.8 F (36 C)      General: Vital signs reviewed and noted. Well-developed, well-nourished, in no acute distress; alert, appropriate and cooperative throughout examination.  Head: Normocephalic, atraumatic.  Lungs:  Normal respiratory effort. Clear to auscultation BL without crackles or wheezes.  Heart: RRR. S1 and S2 normal without gallop, rubs. (+) murmur.  Abdomen:  BS normoactive. Soft, Nondistended, left upper quadrant TTP. No rebound TTP. No masses or organomegaly.  Extremities: No pretibial edema.    Assessment/ Plan:   Case and plan of care discussed with attending physician, Dr. Jonah Blue.

## 2012-04-02 NOTE — Assessment & Plan Note (Signed)
Assessment: Remains of unclear etiology. Patient has had extensive GI workup at [facility], including multiple CTs of her abdomen, MRCP, EUS/EGD (10/05/11), and colonoscopy in January; all unremarkable. Dr. Gery Pray at [facility] does not believe her pain is 2/2 to pancreatic cause, specifically her pancreatic divisum. She had pancreatic stents placed in 2011 that were later removed due to worsening of her symptoms. During her most recent hospitalization, gastric emptying study, porphobilinogen, and lipase were within normal limits. Therefore, cause of symptoms therefore remains unclear, and there is concern for possible diversion given history of pain contract infractions with our clinic.  Of note, the patient has violated the pain contract with [facility], as noted in the [facility]. She was previously informed of this. She is again reminded today of this infraction (which she denies). She is therefore again reminded that she will no longer receive narcotics from [facility].  She is very frustrated with this discussion, but at the end, states to just give her whatever is the strongest medication I am willing to give her, and that she will come back to the ED if her symptoms become too severe. She indicates that Dr. Gery Pray at [facility] has suggested going to [facility] for continue GI follow-up, which she will discuss with him at her appt in January 2014.  Plan:      DC Oxycodone.  Start tramadol.  DO NOT Prescribe narcotics from [facility].  Refilled phenergan for nausea.

## 2012-04-02 NOTE — Progress Notes (Signed)
Pt aware of appt [facility] 04/05/12  3PM for mammogram. Stanton [facility]ney Jin Capote RN 04/02/12 3:45PM

## 2012-04-02 NOTE — Assessment & Plan Note (Addendum)
Pertinent Labs: Lab Results  Component Value Date   HGBA1C 8.4* 02/27/2012   HGBA1C 8.8 10/20/2011   CREATININE 0.58 03/18/2012   CREATININE 0.60 03/12/2012   MICROALBUR 0.50 07/20/2011   MICRALBCREAT 14.6 07/20/2011   CHOL 238* 06/07/2011   HDL 96 06/07/2011   TRIG 43 05/07/1094    Assessment: Disease Control: fair control  Progress toward goals: improved  Barriers to meeting goals: Patient occasionally is unable to tolerate her metformin in the setting of her chronic abdominal pain and nausea/vomiting.  Microvascular complications: none  Macrovascular complications: Questionable history of stroke  On aspirin: No  - not yet indicated   On statin: No - has intermittently been on a statin. Was supposed to restart in 10/2011, however, no refills afterwards.   On ACE-I/ ARB: Yes             1.  Educational resources provided:   2. Self management tools provided:    Home glucose monitoring: Frequency:  (once every few days )  ;  Timing: before breakfast  Patient is compliant most of the time with prescribed medications.   Plan: Glucometer log was not reviewed today, as pt did not have glucometer available for review.   continue current medications  We discussed need to start an addition DM medication - however, patient declined at this time.  Next visit, will rediscuss need for additional diabetic medication - will likely avoid sulfonylurea in setting of inconsistent eating habits, may need to avoid DPP4 and GLP-1 meds 2/2 questionable history of pancreatitis.   Will need to check with pharmacy and pt to see if still taking Zocor.    Diabetes control:  Progress toward A1C goal:  improved  Home glucose monitoring:

## 2012-04-02 NOTE — Assessment & Plan Note (Signed)
Pertinent Data: BP Readings from Last 3 Encounters:  04/02/12 126/84  03/27/12 158/99  03/21/12 148/78    Basic Metabolic Panel:    Component Value Date/Time   NA 140 03/18/2012 0612   K 4.2 03/18/2012 0612   CL 106 03/18/2012 0612   CO2 25 03/18/2012 0612   BUN 3* 03/18/2012 0612   CREATININE 0.58 03/18/2012 0612   CREATININE 0.60 03/12/2012 1448   GLUCOSE 185* 03/18/2012 0612   CALCIUM 9.1 03/18/2012 0612    Assessment: Disease Control: controlled  Progress toward goals: at goal  Barriers to meeting goals: no barriers identified    Patient is compliant most of the time with prescribed medications.   Plan:  continue current medications

## 2012-04-02 NOTE — Assessment & Plan Note (Signed)
Assessment: Disease Control: controlled  Progress toward goals: improved  Barriers to meeting goals: no barriers identified   Plan:      Continue PPI - has had improvement of sx after initiation of omeprazole.  She should alert me if there are persistent symptoms, dysphagia, weight loss or GI bleeding.

## 2012-04-02 NOTE — Patient Instructions (Signed)
   Please follow-up at the clinic in 1 month, at which time we will reevaluate your diabetes and abdominal pain - OR, please follow-up in the clinic sooner if needed.  There have been changes in your medications:  STOP oxycodone after you finish your bottle.  START tramadol for your pain - You have been started on a new medication that can cause drowsiness, do not drive or operate heavy machinery . Do not take this medication with alcohol.     We will get you a referral for pain management.  If you have been started on new medication(s), and you develop symptoms concerning for allergic reaction, including, but not limited to, throat closing, tongue swelling, rash, please stop the medication immediately and call the clinic at [phone], and go to the ER.  If you are diabetic, please bring your meter to your next visit.  If symptoms worsen, or new symptoms arise, please call the clinic or go to the ER.  Please bring all of your medications in a bag to your next visit.

## 2012-04-02 NOTE — Assessment & Plan Note (Signed)
Health Maintenance  Topic Date Due  . Mammogram  03/29/2012  . Hemoglobin A1c  05/29/2012  . Lipid Panel  06/06/2012  . Ophthalmology Exam  06/14/2012  . Foot Exam  10/19/2012  . Influenza Vaccine  12/31/2012  . Pap Smear  03/11/2013  . Colonoscopy  02/26/2014  . Tetanus/tdap  04/06/2021  . Pneumococcal Polysaccharide Vaccine (#2) 02/16/2009    Assessment:  Procedures due: mammogram  Labs due: None  Immunizations due: None  Plan:  Ordered mammogram today.

## 2012-04-05 ENCOUNTER — Ambulatory Visit (HOSPITAL_COMMUNITY): Payer: BC Managed Care – PPO

## 2012-04-17 ENCOUNTER — Other Ambulatory Visit: Payer: Self-pay | Admitting: *Deleted

## 2012-04-17 DIAGNOSIS — R109 Unspecified abdominal pain: Secondary | ICD-10-CM

## 2012-04-17 DIAGNOSIS — R110 Nausea: Secondary | ICD-10-CM

## 2012-04-17 MED ORDER — TRAMADOL HCL 50 MG PO TABS: 50.0000 mg | ORAL_TABLET | Freq: Four times a day (QID) | ORAL | Status: DC | PRN

## 2012-04-17 MED ORDER — PROMETHAZINE HCL 12.5 MG PO TABS: 12.5000 mg | ORAL_TABLET | Freq: Four times a day (QID) | ORAL | Status: DC | PRN

## 2012-04-23 ENCOUNTER — Ambulatory Visit (HOSPITAL_COMMUNITY): Admission: RE | Admit: 2012-04-23 | Payer: BC Managed Care – PPO | Source: Ambulatory Visit

## 2012-05-08 ENCOUNTER — Other Ambulatory Visit: Payer: Self-pay | Admitting: *Deleted

## 2012-05-08 DIAGNOSIS — R110 Nausea: Secondary | ICD-10-CM

## 2012-05-08 MED ORDER — PROMETHAZINE HCL 12.5 MG PO TABS: 12.5000 mg | ORAL_TABLET | Freq: Four times a day (QID) | ORAL | Status: DC | PRN

## 2012-05-08 NOTE — Telephone Encounter (Signed)
Last filled 12/17 for # 30

## 2012-05-22 ENCOUNTER — Other Ambulatory Visit: Payer: Self-pay | Admitting: *Deleted

## 2012-05-22 DIAGNOSIS — R110 Nausea: Secondary | ICD-10-CM

## 2012-05-22 NOTE — Telephone Encounter (Signed)
Last refilled 05/08/12 - pt states she takes Phenergan q6hr and is out.

## 2012-05-23 MED ORDER — PROMETHAZINE HCL 12.5 MG PO TABS: 12.5000 mg | ORAL_TABLET | Freq: Four times a day (QID) | ORAL | Status: DC | PRN

## 2012-05-23 NOTE — Telephone Encounter (Signed)
I am only refilling #30. Please call pt and inform her that this is not a chronic medicine. I want her to come in for an appt. I will likely not be available, therefore, she can schedule with anyone else too.  Thank you! - Johnette Abraham, D.O., 05/23/2012, 4:47 PM

## 2012-05-24 NOTE — Telephone Encounter (Signed)
Pt was called and informed of Phenergan refill per Dr Saralyn Pilar; an appt has been scheduled 2/13.

## 2012-06-07 ENCOUNTER — Other Ambulatory Visit: Payer: Self-pay | Admitting: *Deleted

## 2012-06-07 MED ORDER — LISINOPRIL-HYDROCHLOROTHIAZIDE 20-12.5 MG PO TABS: 1.0000 | ORAL_TABLET | Freq: Every day | ORAL | Status: DC

## 2012-06-14 ENCOUNTER — Encounter: Payer: BC Managed Care – PPO | Admitting: Internal Medicine

## 2012-06-21 ENCOUNTER — Ambulatory Visit: Payer: BC Managed Care – PPO | Admitting: Physical Therapy

## 2012-07-15 ENCOUNTER — Encounter: Payer: Self-pay | Admitting: Internal Medicine

## 2012-08-02 ENCOUNTER — Ambulatory Visit (INDEPENDENT_AMBULATORY_CARE_PROVIDER_SITE_OTHER): Payer: BC Managed Care – PPO | Admitting: Internal Medicine

## 2012-08-02 ENCOUNTER — Encounter: Payer: Self-pay | Admitting: Internal Medicine

## 2012-08-02 VITALS — BP 154/88 | HR 75 | Temp 96.6°F | Ht 60.0 in | Wt 114.8 lb

## 2012-08-02 DIAGNOSIS — Z0000 Encounter for general adult medical examination without abnormal findings: Secondary | ICD-10-CM

## 2012-08-02 DIAGNOSIS — I10 Essential (primary) hypertension: Secondary | ICD-10-CM

## 2012-08-02 DIAGNOSIS — E785 Hyperlipidemia, unspecified: Secondary | ICD-10-CM

## 2012-08-02 DIAGNOSIS — D649 Anemia, unspecified: Secondary | ICD-10-CM

## 2012-08-02 DIAGNOSIS — E119 Type 2 diabetes mellitus without complications: Secondary | ICD-10-CM

## 2012-08-02 LAB — LIPID PANEL
HDL: 89 mg/dL (ref >39)
LDL Cholesterol: 146 mg/dL — ABNORMAL HIGH (ref 0–99)
Triglycerides: 126 mg/dL (ref <150)
VLDL: 25 mg/dL (ref 0–40)

## 2012-08-02 LAB — GLUCOSE, CAPILLARY: Glucose-Capillary: 318 mg/dL — ABNORMAL HIGH (ref 70–99)

## 2012-08-02 LAB — CBC
MCH: 25.6 pg — ABNORMAL LOW (ref 26.0–34.0)
MCHC: 34.6 g/dL (ref 30.0–36.0)
Platelets: 397 10*3/uL (ref 150–400)
RBC: 5.24 MIL/uL — ABNORMAL HIGH (ref 3.87–5.11)

## 2012-08-02 MED ORDER — PANCRELIPASE (LIP-PROT-AMYL) 12000-38000 UNITS PO CPEP: ORAL_CAPSULE | ORAL | Status: DC

## 2012-08-02 MED ORDER — GLIPIZIDE 10 MG PO TABS: 10.0000 mg | ORAL_TABLET | Freq: Every day | ORAL | Status: DC

## 2012-08-02 MED ORDER — ASPIRIN 81 MG PO TABS: 81.0000 mg | ORAL_TABLET | Freq: Every day | ORAL | Status: DC

## 2012-08-02 MED ORDER — METFORMIN HCL 1000 MG PO TABS: 1000.0000 mg | ORAL_TABLET | Freq: Two times a day (BID) | ORAL | Status: DC

## 2012-08-02 NOTE — Assessment & Plan Note (Signed)
Pertinent Labs: Lab Results  Component Value Date   HGBA1C >14.0 08/02/2012   HGBA1C 8.4* 02/27/2012   CREATININE 0.58 03/18/2012   CREATININE 0.60 03/12/2012   MICROALBUR 0.50 07/20/2011   MICRALBCREAT 14.6 07/20/2011   CHOL 238* 06/07/2011   HDL 96 06/07/2011   TRIG 43 05/07/1094    Assessment: Disease Control:  Uncontrolled  Progress toward goals:  Deteriorated  Barriers to meeting goals: nonadherence to medications  Microvascular complications: none  Macrovascular complications: cardiovascular disease  On aspirin: No - will start    On statin: No: has been recommended on multiple occasions, however never started because of her frequent acute abd pain exacerbations.  On ACE-I/ ARB: Yes     Plan: Glucometer log was not reviewed today, as pt did not have glucometer available for review.   Continue metformin - refill sent  reminded to bring blood glucose meter & log to each visit  Glipizide 10mg  to be started today - instructed to hold medication if she does not eat or eats very little.  Discussed diabetic diet, and handout given.  Start aspirin and statin  Educational resources provided:    Self management tools provided:    Home glucose monitoring recommendation:    at least once daily before breakfast

## 2012-08-02 NOTE — Assessment & Plan Note (Addendum)
Pertinent Labs: Liver Function Tests:    Component Value Date/Time   AST 15 03/15/2012 0625   ALT 9 03/15/2012 0625   ALKPHOS 74 03/15/2012 0625   BILITOT 0.4 03/15/2012 0625   PROT 6.8 03/15/2012 0625   ALBUMIN 3.2* 03/15/2012 0625    Lipid Panel:     Component Value Date/Time   CHOL 238* 06/07/2011 1637   TRIG 43 06/07/2011 1637   HDL 96 06/07/2011 1637   CHOLHDL 2.5 06/07/2011 1637   VLDL 9 06/07/2011 1637   LDLCALC 133* 06/07/2011 1637     Assessment: Goal LDL (per ATP guidelines): < 100 mg/dL  Disease Control: not controlled  Progress toward goals: unable to assess  Barriers to meeting goals: no barriers identified    Has not been on any medication recently.    Plan:  start pravastatin 40mg  daily  The patient was called and advised of this recommendation

## 2012-08-02 NOTE — Progress Notes (Signed)
Patient: [name]   MRN: [id number] DOB: 1955/09/20  PCP: Saralyn Pilar, DO   Subjective:    HPI: [name] is a 57 y.o. female with a PMHx as outlined below, who presented to clinic today for the following:  1) DM2, uncontrolled -  Lab Results  Component Value Date   HGBA1C >14.0 08/02/2012   Is under tremendous stress recently after her husband lost his job and she has lost her job (but still has insurance for now). Therefore, she has been unable to afford her metformin 1000mg  BID - which she has been out of x 1 month. She additionally has been drinking a lot of sweet tea and koolaid to which she adds splenda.  admits to polyuria, polydipsia. Denies nausea, vomiting, diarrhea.  does request refills today.  In regards to diabetic complications:  Microvascular complications: Confirms: none ; Denies nephropathy, retinopathy, autonomic neuropathy and peripheral neuropathy.  Macrovascular complications: Confirms: none ; Denies cardiovascular disease, cerebrovascular disease and peripheral vascular disease.   Important diabetic medications: Is patient on aspirin? Yes Is patient on a statin? No Is patient on an ACE-I/ ARB? Yes  2) HLD - currently taking no medications. She has been recommended statin therapy multiple times - however, has been interrupted by frequent hospitalizations for her pancreatitis. Therefore, has never started this therapy.  denies chest pain, difficulty breathing, palpitations, tachycardia, and muscle pains. does request refills today.   3) HTN - Currently taking lisinopril-hydrochlorothiazide 20-12.5 mg. Patient misses doses 0 x per week on average. denies headaches, dizziness, lightheadedness, chest pain, shortness of breath.  does not request refills today.    Review of Systems: Per HPI.   Current Outpatient Medications: Medication Sig  . docusate sodium (COLACE) 100 MG capsule Take 1 capsule (100 mg total) by mouth 2 (two) times  daily.  . lipase/protease/amylase (CREON-10/PANCREASE) 12000 UNITS CPEP Take 1-2 capsules by mouth 4 (four) times daily. Take 2 capsules by mouth with meals, and 1 capsule by mouth with snacks.  Marland Kitchen lisinopril-hydrochlorothiazide (PRINZIDE,ZESTORETIC) 20-12.5 MG per tablet Take 1 tablet by mouth at bedtime.  . metFORMIN (GLUCOPHAGE) 1000 MG tablet Take 1,000 mg by mouth 2 (two) times daily with a meal.  . metoCLOPramide (REGLAN) 5 MG tablet Take 1 tablet (5 mg total) by mouth 3 (three) times daily with meals.  Marland Kitchen omeprazole (PRILOSEC) 20 MG capsule Take 20 mg by mouth daily.  . polyethylene glycol (MIRALAX / GLYCOLAX) packet Take 17 g by mouth daily.  . promethazine (PHENERGAN) 12.5 MG tablet Take 1 tablet (12.5 mg total) by mouth every 6 (six) hours as needed for nausea.  . traMADol (ULTRAM) 50 MG tablet Take 1 tablet (50 mg total) by mouth every 6 (six) hours as needed for pain.    Allergies  Allergen Reactions  . Fioricet (Butalbital-Apap-Caffeine) Other (See Comments)    Put into a coma state; "it was actually fiorinol; same family" (02/27/2012)  . Shrimp Flavor Anaphylaxis    unknow  . Sulfonamide Derivatives Rash    Past Medical History  Diagnosis Date  . Hypertension   . HLD (hyperlipidemia) 2009  . Sickle cell trait   . Pulmonary embolus 08/2004    Two areas of V/Q mismatch. Findings compatible  with high probability for pulmonary embolus.; pt was on coumadin for 1 year  . Pancreatic divisum     S/P ERCP with stenting 07/2010 at [facility], then stent removal (08/05/2010)  . CVA (cerebral infarction) 1993    Per report by patient she  had a stroke and prolonged rehab course eventually resolving her right sided weakness, as per her hx obtained 08/2007  . Blood dyscrasia     thalasemia, sickle cell trait  . Chronic abdominal pain     Unclear etiology, thought to be due to chronic pancreatitis previously in setting of her pancreatic divisum - however, EUS and EGD performed [facility]  (10/05/2011) showing normal esophageall, gastric, duodenal mucosa. EUS showing no pancreatic masses, cysts, or changes of chronic pancreatitis. Biliary system nondilated and had no endosonographic abnormalities.  . Type II diabetes mellitus 2010    well controlled  . Asthma     childhood asthma  . Beta thalassemia   . Migraine headache   . Seizures 1993    remote history  . Stroke 1993    very small amount of left sided weakness  . Uterine cancer     Past Surgical History  Procedure Laterality Date  . Pancreatic stent placement/removal      placed in 2011; removed in 2012/H&P (02/27/2012)  . Abdominal hysterectomy  1980    2/2 endometriosis  . Breast lumpectomy  1980's    bilaterally; "in my milk ducts; both benign" (02/27/2012)     Objective:    Physical Exam: Filed Vitals:   08/02/12 1611  BP: 154/88  Pulse: 75  Temp: 96.6 F (35.9 C)     General: Vital signs reviewed and noted. Well-developed, well-nourished, in no acute distress; alert, appropriate and cooperative throughout examination.  Head: Normocephalic, atraumatic.  Lungs:  Normal respiratory effort. Clear to auscultation BL without crackles or wheezes.  Heart: RRR. S1 and S2 normal without gallop, rubs. (+) murmur.  Abdomen:  BS normoactive. Soft, Nondistended, no tenderness to palpation. No masses or organomegaly.  Extremities: No pretibial edema.    Assessment/ Plan:   The patient's case and plan of care was discussed with attending physician, Dr. Doneen Poisson.

## 2012-08-02 NOTE — Patient Instructions (Signed)
General Instructions:  Please follow-up at the clinic in 3 weeks, at which time we will reevaluate your diabetes - OR, please follow-up in the clinic sooner if needed.  There have been changes in your medications:  RESTART your metformin  START glipizide for your diabetes - please assess if you are having rash then stop the medication  RESTART your creon   Check your blood sugars once daily before breakfast  If you have been started on new medication(s), and you develop symptoms concerning for allergic reaction, including, but not limited to, throat closing, tongue swelling, rash, please stop the medication immediately and call the clinic at [phone], and go to the ER.  If you are diabetic, please bring your meter to your next visit.  If symptoms worsen, or new symptoms arise, please call the clinic or go to the ER.  PLEASE BRING ALL OF YOUR MEDICATIONS  IN A BAG TO YOUR NEXT APPOINTMENT   Treatment Goals:  Goals (1 Years of Data) as of 08/02/12         As of Today 04/02/12 03/27/12 03/21/12 03/18/12     Blood Pressure    . Blood Pressure < 140/90  154/88 126/84 158/99 148/78 127/71     Result Component    . HEMOGLOBIN A1C < 7.0  >14.0        . HEMOGLOBIN A1C < 7.0  >14.0        . LDL CALC < 100            Progress Toward Treatment Goals:  Treatment Goal 04/02/2012  Hemoglobin A1C improved  Blood pressure at goal    Self Care Goals & Plans:  Self Care Goal 08/02/2012  Manage my medications take my medicines as prescribed; bring my medications to every visit; refill my medications on time  Monitor my health keep track of my blood glucose  Eat healthy foods drink diet soda or water instead of juice or soda; eat more vegetables; eat foods that are low in salt; eat baked foods instead of fried foods  Be physically active find an activity I enjoy    Home Blood Glucose Monitoring 04/02/2012  Check my blood sugar (No Data)  When to check my blood sugar before breakfast      Care Management & Community Referrals:        Diabetes Meal Planning Guide The diabetes meal planning guide is a tool to help you plan your meals and snacks. It is important for people with diabetes to manage their blood glucose (sugar) levels. Choosing the right foods and the right amounts throughout your day will help control your blood glucose. Eating right can even help you improve your blood pressure and reach or maintain a healthy weight. CARBOHYDRATE COUNTING MADE EASY When you eat carbohydrates, they turn to sugar. This raises your blood glucose level. Counting carbohydrates can help you control this level so you feel better. When you plan your meals by counting carbohydrates, you can have more flexibility in what you eat and balance your medicine with your food intake. Carbohydrate counting simply means adding up the total amount of carbohydrate grams in your meals and snacks. Try to eat about the same amount at each meal. Foods with carbohydrates are listed below. Each portion below is 1 carbohydrate serving or 15 grams of carbohydrates. Ask your dietician how many grams of carbohydrates you should eat at each meal or snack. Grains and Starches  1 slice bread.   English muffin or hotdog/hamburger  bun.   cup cold cereal (unsweetened).   cup cooked pasta or rice.   cup starchy vegetables (corn, potatoes, peas, beans, winter squash).  1 tortilla (6 inches).   bagel.  1 waffle or pancake (size of a CD).   cup cooked cereal.  4 to 6 small crackers. *Whole grain is recommended. Fruit  1 cup fresh unsweetened berries, melon, papaya, pineapple.  1 small fresh fruit.   banana or mango.   cup fruit juice (4 oz unsweetened).   cup canned fruit in natural juice or water.  2 tbs dried fruit.  12 to 15 grapes or cherries. Milk and Yogurt  1 cup fat-free or 1% milk.  1 cup soy milk.  6 oz light yogurt with sugar-free sweetener.  6 oz low-fat soy  yogurt.  6 oz plain yogurt. Vegetables  1 cup raw or  cup cooked is counted as 0 carbohydrates or a "free" food.  If you eat 3 or more servings at 1 meal, count them as 1 carbohydrate serving. Other Carbohydrates   oz chips or pretzels.   cup ice cream or frozen yogurt.   cup sherbet or sorbet.  2 inch square cake, no frosting.  1 tbs honey, sugar, jam, jelly, or syrup.  2 small cookies.  3 squares of graham crackers.  3 cups popcorn.  6 crackers.  1 cup broth-based soup.  Count 1 cup casserole or other mixed foods as 2 carbohydrate servings.  Foods with less than 20 calories in a serving may be counted as 0 carbohydrates or a "free" food. You may want to purchase a book or computer software that lists the carbohydrate gram counts of different foods. In addition, the nutrition facts panel on the labels of the foods you eat are a good source of this information. The label will tell you how big the serving size is and the total number of carbohydrate grams you will be eating per serving. Divide this number by 15 to obtain the number of carbohydrate servings in a portion. Remember, 1 carbohydrate serving equals 15 grams of carbohydrate. SERVING SIZES Measuring foods and serving sizes helps you make sure you are getting the right amount of food. The list below tells how big or small some common serving sizes are.  1 oz.........4 stacked dice.  3 oz........Marland KitchenDeck of cards.  1 tsp.......Marland KitchenTip of little finger.  1 tbs......Marland KitchenMarland KitchenThumb.  2 tbs.......Marland KitchenGolf ball.   cup......Marland KitchenHalf of a fist.  1 cup.......Marland KitchenA fist. SAMPLE DIABETES MEAL PLAN Below is a sample meal plan that includes foods from the grain and starches, dairy, vegetable, fruit, and meat groups. A dietician can individualize a meal plan to fit your calorie needs and tell you the number of servings needed from each food group. However, controlling the total amount of carbohydrates in your meal or snack is more  important than making sure you include all of the food groups at every meal. You may interchange carbohydrate containing foods (dairy, starches, and fruits). The meal plan below is an example of a 2000 calorie diet using carbohydrate counting. This meal plan has 17 carbohydrate servings. Breakfast  1 cup oatmeal (2 carb servings).   cup light yogurt (1 carb serving).  1 cup blueberries (1 carb serving).   cup almonds. Snack  1 large apple (2 carb servings).  1 low-fat string cheese stick. Lunch  Chicken breast salad.  1 cup spinach.   cup chopped tomatoes.  2 oz chicken breast, sliced.  2 tbs low-fat [place] dressing.  12 whole-wheat crackers (2 carb servings).  12 to 15 grapes (1 carb serving).  1 cup low-fat milk (1 carb serving). Snack  1 cup carrots.   cup hummus (1 carb serving). Dinner  3 oz broiled salmon.  1 cup brown rice (3 carb servings). Snack  1  cups steamed broccoli (1 carb serving) drizzled with 1 tsp olive oil and lemon juice.  1 cup light pudding (2 carb servings). DIABETES MEAL PLANNING WORKSHEET Your dietician can use this worksheet to help you decide how many servings of foods and what types of foods are right for you.  BREAKFAST Food Group and Servings / Carb Servings Grain/Starches __________________________________ Dairy __________________________________________ Vegetable ______________________________________ Fruit ___________________________________________ Meat __________________________________________ Fat ____________________________________________ LUNCH Food Group and Servings / Carb Servings Grain/Starches ___________________________________ Dairy ___________________________________________ Fruit ____________________________________________ Meat ___________________________________________ Fat _____________________________________________ Laural Golden Food Group and Servings / Carb Servings Grain/Starches  ___________________________________ Dairy ___________________________________________ Fruit ____________________________________________ Meat ___________________________________________ Fat _____________________________________________ SNACKS Food Group and Servings / Carb Servings Grain/Starches ___________________________________ Dairy ___________________________________________ Vegetable _______________________________________ Fruit ____________________________________________ Meat ___________________________________________ Fat _____________________________________________ DAILY TOTALS Starches _________________________ Vegetable ________________________ Fruit ____________________________ Dairy ____________________________ Meat ____________________________ Fat ______________________________ Document Released: 01/13/2005 Document Revised: 07/11/2011 Document Reviewed: 11/24/2008 ExitCare Patient Information 2013 [place], [place].

## 2012-08-03 ENCOUNTER — Encounter: Payer: Self-pay | Admitting: Internal Medicine

## 2012-08-03 MED ORDER — LISINOPRIL-HYDROCHLOROTHIAZIDE 20-12.5 MG PO TABS: 2.0000 | ORAL_TABLET | Freq: Every day | ORAL | Status: DC

## 2012-08-03 MED ORDER — PRAVASTATIN SODIUM 40 MG PO TABS: 40.0000 mg | ORAL_TABLET | Freq: Every evening | ORAL | Status: DC

## 2012-08-03 NOTE — Assessment & Plan Note (Signed)
Health Maintenance  Topic Date Due  . Mammogram  03/29/2012  . Ophthalmology Exam  06/14/2012  . Pneumococcal Polysaccharide Vaccine (#2) 07/30/2018  . Foot Exam  10/19/2012  . Hemoglobin A1c  11/01/2012  . Influenza Vaccine  12/31/2012  . Pap Smear  03/11/2013  . Lipid Panel  08/02/2013  . Colonoscopy  02/26/2014  . Tetanus/tdap  04/06/2021    Assessment:  Procedures due: mammogram  Labs due: None  Immunizations due: None  Plan:  Ordered mammogram today.

## 2012-08-03 NOTE — Assessment & Plan Note (Signed)
Pertinent Data: BP Readings from Last 3 Encounters:  08/02/12 154/88  04/02/12 126/84  03/27/12 158/99    Basic Metabolic Panel:    Component Value Date/Time   NA 140 03/18/2012 0612   K 4.2 03/18/2012 0612   CL 106 03/18/2012 0612   CO2 25 03/18/2012 0612   BUN 3* 03/18/2012 0612   CREATININE 0.58 03/18/2012 0612   CREATININE 0.60 03/12/2012 1448   GLUCOSE 185* 03/18/2012 0612   CALCIUM 9.1 03/18/2012 0612    Assessment: Disease Control: mildly elevated  Progress toward goals: unchanged  Barriers to meeting goals: financial need     Patient is compliant most of the time with prescribed medications.   Plan:  Will increase the Lisinopril-HCTZ 20-12.5mg  to two tablets once daily.  The patient was called and informed of this recommendation.  I will not call in any prescription at this time, instead, she will use her home medication. When I see her back in a few weeks, we will plan to likely right any prescription at that point.  Educational resources provided:    Self management tools provided:

## 2012-08-03 NOTE — Progress Notes (Signed)
Quick Note:  Not at goal, but not on therapy. I will start pravastatin 40 mg daily. Although not a potent statin, it is more affordable for her at this time. ______

## 2012-08-06 NOTE — Progress Notes (Signed)
Case discussed with Dr. Kalia-Reynolds at time of visit. We reviewed the resident's history and exam and pertinent patient test results. I agree with the assessment, diagnosis, and plan of care documented in the resident's note.  

## 2012-08-07 ENCOUNTER — Telehealth: Payer: Self-pay | Admitting: Internal Medicine

## 2012-08-07 NOTE — Telephone Encounter (Signed)
  INTERNAL MEDICINE RESIDENCY PROGRAM After-Hours Telephone Call    Reason for call:   I received a call from [name] at 5:30  PM indicating lightheadedness.    Pertinent Data:   The patient has a history of DM, last A1C > 14.  She was recently started on Glipizide, in addition to metformin.  Today, she took her 1st dose of glipizide at 2pm.  At 4:00 pm, she started feeling lightheadedness, diaphoresis, and mild tremulousness, so she ate a piece of candy.  CHG at 4:30 pm was 294, and CBG at 5:30 pm was 109.  The patient continues to feel these symptoms.  She notes no rash, itching, lip swelling, SOB, or airway tightening.    Assessment / Plan / Recommendations:   The patient describes symptoms of hypoglycemia, though this surprises me with blood sugars of 109-294.  Perhaps, with an A1C >14, she is used to chronic hyperglycemia, and is experiencing these symptoms in response to normal blood sugars which are lower than her previous baseline.  The patient notes no symptoms to suggest an allergic reaction.  I advised the patient to eat a meal or snack, and to continue to take her metformin tonight, and to take her glipizide again tomorrow.  If she re-experiences symptoms, she is advised to call our clinic.  As always, pt is advised that if symptoms worsen or new symptoms arise, they should go to an urgent care facility or to to ER for further evaluation.    Linward Headland, MD   08/07/2012, 5:41 PM

## 2012-08-08 ENCOUNTER — Inpatient Hospital Stay (HOSPITAL_COMMUNITY): Admission: RE | Admit: 2012-08-08 | Payer: BC Managed Care – PPO | Source: Ambulatory Visit

## 2012-08-13 ENCOUNTER — Ambulatory Visit (HOSPITAL_COMMUNITY)
Admission: RE | Admit: 2012-08-13 | Discharge: 2012-08-13 | Disposition: A | Discharge status: Home / Self Care | Payer: BC Managed Care – PPO | Source: Ambulatory Visit | Attending: Internal Medicine | Admitting: Internal Medicine

## 2012-08-13 DIAGNOSIS — Z1231 Encounter for screening mammogram for malignant neoplasm of breast: Secondary | ICD-10-CM | POA: Insufficient documentation

## 2012-08-13 DIAGNOSIS — Z1239 Encounter for other screening for malignant neoplasm of breast: Secondary | ICD-10-CM

## 2012-08-20 ENCOUNTER — Ambulatory Visit: Payer: BC Managed Care – PPO | Admitting: Internal Medicine

## 2012-09-21 ENCOUNTER — Other Ambulatory Visit: Payer: Self-pay | Admitting: *Deleted

## 2012-09-21 DIAGNOSIS — R110 Nausea: Secondary | ICD-10-CM

## 2012-09-21 MED ORDER — PROMETHAZINE HCL 12.5 MG PO TABS: 12.5000 mg | ORAL_TABLET | Freq: Four times a day (QID) | ORAL | Status: DC | PRN

## 2012-11-08 ENCOUNTER — Other Ambulatory Visit: Payer: Self-pay

## 2012-11-20 ENCOUNTER — Encounter (HOSPITAL_COMMUNITY): Payer: Self-pay | Admitting: General Practice

## 2012-11-20 ENCOUNTER — Encounter: Payer: Self-pay | Admitting: Internal Medicine

## 2012-11-20 ENCOUNTER — Other Ambulatory Visit: Payer: Self-pay | Admitting: Internal Medicine

## 2012-11-20 ENCOUNTER — Ambulatory Visit (INDEPENDENT_AMBULATORY_CARE_PROVIDER_SITE_OTHER): Payer: BC Managed Care – PPO | Admitting: Internal Medicine

## 2012-11-20 ENCOUNTER — Inpatient Hospital Stay (HOSPITAL_COMMUNITY)
Admission: AD | Admit: 2012-11-20 | Discharge: 2012-11-24 | DRG: 183 | Disposition: A | Discharge status: Home / Self Care | Payer: BC Managed Care – PPO | Source: Ambulatory Visit | Attending: Internal Medicine | Admitting: Internal Medicine

## 2012-11-20 VITALS — BP 155/86 | HR 90 | Temp 97.0°F | Ht 62.0 in | Wt 105.9 lb

## 2012-11-20 DIAGNOSIS — Q453 Other congenital malformations of pancreas and pancreatic duct: Secondary | ICD-10-CM

## 2012-11-20 DIAGNOSIS — I69998 Other sequelae following unspecified cerebrovascular disease: Secondary | ICD-10-CM

## 2012-11-20 DIAGNOSIS — F450 Somatization disorder: Secondary | ICD-10-CM | POA: Diagnosis present

## 2012-11-20 DIAGNOSIS — R1012 Left upper quadrant pain: Secondary | ICD-10-CM | POA: Diagnosis present

## 2012-11-20 DIAGNOSIS — Z87891 Personal history of nicotine dependence: Secondary | ICD-10-CM

## 2012-11-20 DIAGNOSIS — R110 Nausea: Secondary | ICD-10-CM

## 2012-11-20 DIAGNOSIS — K219 Gastro-esophageal reflux disease without esophagitis: Secondary | ICD-10-CM | POA: Diagnosis present

## 2012-11-20 DIAGNOSIS — D561 Beta thalassemia: Secondary | ICD-10-CM | POA: Diagnosis present

## 2012-11-20 DIAGNOSIS — E785 Hyperlipidemia, unspecified: Secondary | ICD-10-CM

## 2012-11-20 DIAGNOSIS — E119 Type 2 diabetes mellitus without complications: Secondary | ICD-10-CM | POA: Diagnosis present

## 2012-11-20 DIAGNOSIS — I10 Essential (primary) hypertension: Secondary | ICD-10-CM | POA: Diagnosis present

## 2012-11-20 DIAGNOSIS — G8929 Other chronic pain: Secondary | ICD-10-CM | POA: Diagnosis present

## 2012-11-20 DIAGNOSIS — E1165 Type 2 diabetes mellitus with hyperglycemia: Secondary | ICD-10-CM | POA: Diagnosis present

## 2012-11-20 DIAGNOSIS — R5381 Other malaise: Secondary | ICD-10-CM | POA: Diagnosis present

## 2012-11-20 DIAGNOSIS — R109 Unspecified abdominal pain: Secondary | ICD-10-CM

## 2012-11-20 DIAGNOSIS — K589 Irritable bowel syndrome without diarrhea: Principal | ICD-10-CM | POA: Diagnosis present

## 2012-11-20 DIAGNOSIS — K861 Other chronic pancreatitis: Secondary | ICD-10-CM | POA: Insufficient documentation

## 2012-11-20 LAB — CBC WITH DIFFERENTIAL/PLATELET
Basophils Relative: 0 % (ref 0–1)
Eosinophils Absolute: 0.1 10*3/uL (ref 0.0–0.7)
HCT: 39.8 % (ref 36.0–46.0)
Hemoglobin: 13.9 g/dL (ref 12.0–15.0)
MCH: 26.3 pg (ref 26.0–34.0)
MCHC: 34.9 g/dL (ref 30.0–36.0)
MCV: 75.4 fL — ABNORMAL LOW (ref 78.0–100.0)
Monocytes Absolute: 0.6 10*3/uL (ref 0.1–1.0)
Monocytes Relative: 7 % (ref 3–12)

## 2012-11-20 LAB — URINALYSIS, ROUTINE W REFLEX MICROSCOPIC
Glucose, UA: NEGATIVE mg/dL
Protein, ur: NEGATIVE mg/dL
pH: 5 (ref 5.0–8.0)

## 2012-11-20 LAB — MAGNESIUM: Magnesium: 1.5 mg/dL (ref 1.5–2.5)

## 2012-11-20 LAB — COMPLETE METABOLIC PANEL WITH GFR
ALT: 10 U/L (ref 0–35)
Alkaline Phosphatase: 62 U/L (ref 39–117)
Sodium: 136 mEq/L (ref 135–145)
Total Bilirubin: 0.4 mg/dL (ref 0.3–1.2)
Total Protein: 8.3 g/dL (ref 6.0–8.3)

## 2012-11-20 LAB — POCT GLYCOSYLATED HEMOGLOBIN (HGB A1C): Hemoglobin A1C: 6.6

## 2012-11-20 LAB — GLUCOSE, CAPILLARY: Glucose-Capillary: 96 mg/dL (ref 70–99)

## 2012-11-20 LAB — URINALYSIS, MICROSCOPIC ONLY

## 2012-11-20 MED ORDER — LISINOPRIL-HYDROCHLOROTHIAZIDE 20-12.5 MG PO TABS: 1.0000 | ORAL_TABLET | Freq: Every evening | ORAL | Status: DC

## 2012-11-20 MED ORDER — SODIUM CHLORIDE 0.9 % IV BOLUS (SEPSIS)
1000.0000 mL | Freq: Once | INTRAVENOUS | Status: AC
  Administered 2012-11-20: 1000 mL via INTRAVENOUS

## 2012-11-20 MED ORDER — MORPHINE SULFATE 2 MG/ML IJ SOLN
2.0000 mg | INTRAMUSCULAR | Status: DC | PRN
  Administered 2012-11-20 – 2012-11-21 (×5): 2 mg via INTRAVENOUS
  Filled 2012-11-20 (×5): qty 1

## 2012-11-20 MED ORDER — SODIUM CHLORIDE 0.9 % IV SOLN
INTRAVENOUS | Status: DC
  Administered 2012-11-21 – 2012-11-22 (×4): via INTRAVENOUS

## 2012-11-20 MED ORDER — INSULIN ASPART 100 UNIT/ML ~~LOC~~ SOLN
0.0000 [IU] | Freq: Three times a day (TID) | SUBCUTANEOUS | Status: DC
  Administered 2012-11-21: 2 [IU] via SUBCUTANEOUS
  Administered 2012-11-23 – 2012-11-24 (×2): 1 [IU] via SUBCUTANEOUS

## 2012-11-20 MED ORDER — PROMETHAZINE HCL 25 MG PO TABS
12.5000 mg | ORAL_TABLET | Freq: Four times a day (QID) | ORAL | Status: DC | PRN
  Administered 2012-11-20 – 2012-11-24 (×12): 12.5 mg via ORAL
  Filled 2012-11-20 (×3): qty 1
  Filled 2012-11-20: qty 2
  Filled 2012-11-20 (×8): qty 1

## 2012-11-20 MED ORDER — HYDROCHLOROTHIAZIDE 12.5 MG PO CAPS
12.5000 mg | ORAL_CAPSULE | Freq: Every day | ORAL | Status: DC
  Filled 2012-11-20 (×2): qty 1

## 2012-11-20 MED ORDER — LISINOPRIL 20 MG PO TABS
20.0000 mg | ORAL_TABLET | Freq: Every day | ORAL | Status: DC
  Filled 2012-11-20 (×6): qty 1

## 2012-11-20 MED ORDER — HEPARIN SODIUM (PORCINE) 5000 UNIT/ML IJ SOLN
5000.0000 [IU] | Freq: Three times a day (TID) | INTRAMUSCULAR | Status: DC
  Administered 2012-11-20 – 2012-11-24 (×11): 5000 [IU] via SUBCUTANEOUS
  Filled 2012-11-20 (×18): qty 1

## 2012-11-20 NOTE — Progress Notes (Signed)
  Subjective:    Patient ID: [name], female    DOB: Jul 10, 1955, 57 y.o.   MRN: [id number]  HPI Comments: 57 y.o history of pancreatitis, pancreatic divism s/p spincterotomy and stent placement, HTN (BP 155/86), DM (last hA1C 14.0 in 07/2012 she does not have a meter), dyslipidemia.  She follows at [facility] last follow up was 06/2012 and Dr. Rochel Brome stated they were considering another stent per patient though no documentation in care everywhere notes.  She presents with left upper quandrant abdominal pain x 4 days >10/10 radiating to left back, nausea. Pain is 10/10 sharp.  She also has decreased appetite and unable to tolerate oral intake every time she tries she feels nauseated and gags.  She had diarrhea this am. Symptoms started Saturday prior to visit. She is taking Oxycondone 10 mg tid from an outside provider (pain clinic) w/o relief. She called the clinic Monday for an appt today.  She did not try phenergan for nausea because she ran out of the medication.    Of note [name] 10/2011 did not show gallstones.    SH: Unemployed, married, applying for disability.   ROS per HPI      Review of Systems  Constitutional: Negative for fever and chills.  Cardiovascular: Negative for leg swelling.  Gastrointestinal: Negative for blood in stool.  Genitourinary: Negative for dysuria, hematuria and difficulty urinating.       Objective:   Physical Exam  Nursing note and vitals reviewed. Constitutional: She is oriented to person, place, and time. She appears well-developed and well-nourished. She is cooperative. No distress.  HENT:  Head: Normocephalic and atraumatic.    Mouth/Throat: Oropharynx is clear and moist and mucous membranes are normal. Abnormal dentition. No oropharyngeal exudate.  Eyes: Conjunctivae are normal. Pupils are equal, round, and reactive to light. Right eye exhibits no discharge. Left eye exhibits no discharge. No scleral icterus.    Cardiovascular: Normal rate, regular rhythm, S1 normal, S2 normal and normal heart sounds.   No murmur heard. Pulmonary/Chest: Effort normal and breath sounds normal. No respiratory distress. She has no wheezes.    Abdominal: Soft. Bowel sounds are normal. There is tenderness in the left upper quadrant. There is no CVA tenderness.    Neurological: She is alert and oriented to person, place, and time. Gait normal.  Skin: Skin is warm, dry and intact. No rash noted. She is not diaphoretic.  Psychiatric: She has a normal mood and affect. Her speech is normal and behavior is normal. Judgment and thought content normal. Cognition and memory are normal.          Assessment & Plan:  Will admit to inpatient med surg

## 2012-11-20 NOTE — Patient Instructions (Signed)
Acute Pancreatitis Acute pancreatitis is a disease in which the pancreas becomes suddenly inflamed. The pancreas is a large gland located behind your stomach. The pancreas produces enzymes that help digest food. The pancreas also releases the hormones glucagon and insulin that help regulate blood sugar. Damage to the pancreas occurs when the digestive enzymes from the pancreas are activated and begin attacking the pancreas before being released into the intestine. Most acute attacks last a couple of days and can cause serious complications. Some people become dehydrated and develop low blood pressure. In severe cases, bleeding into the pancreas can lead to shock and can be life-threatening. The lungs, heart, and kidneys may fail. CAUSES  Pancreatitis can happen to anyone. In some cases, the cause is unknown. Most cases are caused by:  Alcohol abuse.  Gallstones. Other less common causes are:  Certain medicines.  Exposure to certain chemicals.  Infection.  Damage caused by an accident (trauma).  Abdominal surgery. SYMPTOMS   Pain in the upper abdomen that may radiate to the back.  Tenderness and swelling of the abdomen.  Nausea and vomiting. DIAGNOSIS  Your caregiver will perform a physical exam. Blood and stool tests may be done to confirm the diagnosis. Imaging tests may also be done, such as X-rays, CT scans, or an ultrasound of the abdomen. TREATMENT  Treatment usually requires a stay in the hospital. Treatment may include:  Pain medicine.  Fluid replacement through an intravenous line (IV).  Placing a tube in the stomach to remove stomach contents and control vomiting.  Not eating for 3 or 4 days. This gives your pancreas a rest, because enzymes are not being produced that can cause further damage.  Antibiotic medicines if your condition is caused by an infection.  Surgery of the pancreas or gallbladder. HOME CARE INSTRUCTIONS   Follow the diet advised by your  caregiver. This may involve avoiding alcohol and decreasing the amount of fat in your diet.  Eat smaller, more frequent meals. This reduces the amount of digestive juices the pancreas produces.  Drink enough fluids to keep your urine clear or pale yellow.  Only take over-the-counter or prescription medicines as directed by your caregiver.  Avoid drinking alcohol if it caused your condition.  Do not smoke.  Get plenty of rest.  Check your blood sugar at home as directed by your caregiver.  Keep all follow-up appointments as directed by your caregiver. SEEK MEDICAL CARE IF:   You do not recover as quickly as expected.  You develop new or worsening symptoms.  You have persistent pain, weakness, or nausea.  You recover and then have another episode of pain. SEEK IMMEDIATE MEDICAL CARE IF:   You are unable to eat or keep fluids down.  Your pain becomes severe.  You have a fever or persistent symptoms for more than 2 to 3 days.  You have a fever and your symptoms suddenly get worse.  Your skin or the white part of your eyes turn yellow (jaundice).  You develop vomiting.  You feel dizzy, or you faint.  Your blood sugar is high (over 300 mg/dL). MAKE SURE YOU:   Understand these instructions.  Will watch your condition.  Will get help right away if you are not doing well or get worse. Document Released: 04/18/2005 Document Revised: 10/18/2011 Document Reviewed: 07/28/2011 [facility] Patient Information 2014 [place], [place].  Nausea and Vomiting Nausea is a sick feeling that often comes before throwing up (vomiting). Vomiting is a reflex where stomach contents come  out of your mouth. Vomiting can cause severe loss of body fluids (dehydration). Children and elderly adults can become dehydrated quickly, especially if they also have diarrhea. Nausea and vomiting are symptoms of a condition or disease. It is important to find the cause of your symptoms. CAUSES   Direct  irritation of the stomach lining. This irritation can result from increased acid production (gastroesophageal reflux disease), infection, food poisoning, taking certain medicines (such as nonsteroidal anti-inflammatory drugs), alcohol use, or tobacco use.  Signals from the brain.These signals could be caused by a headache, heat exposure, an inner ear disturbance, increased pressure in the brain from injury, infection, a tumor, or a concussion, pain, emotional stimulus, or metabolic problems.  An obstruction in the gastrointestinal tract (bowel obstruction).  Illnesses such as diabetes, hepatitis, gallbladder problems, appendicitis, kidney problems, cancer, sepsis, atypical symptoms of a heart attack, or eating disorders.  Medical treatments such as chemotherapy and radiation.  Receiving medicine that makes you sleep (general anesthetic) during surgery. DIAGNOSIS Your caregiver may ask for tests to be done if the problems do not improve after a few days. Tests may also be done if symptoms are severe or if the reason for the nausea and vomiting is not clear. Tests may include:  Urine tests.  Blood tests.  Stool tests.  Cultures (to look for evidence of infection).  X-rays or other imaging studies. Test results can help your caregiver make decisions about treatment or the need for additional tests. TREATMENT You need to stay well hydrated. Drink frequently but in small amounts.You may wish to drink water, sports drinks, clear broth, or eat frozen ice pops or gelatin dessert to help stay hydrated.When you eat, eating slowly may help prevent nausea.There are also some antinausea medicines that may help prevent nausea. HOME CARE INSTRUCTIONS   Take all medicine as directed by your caregiver.  If you do not have an appetite, do not force yourself to eat. However, you must continue to drink fluids.  If you have an appetite, eat a normal diet unless your caregiver tells you  differently.  Eat a variety of complex carbohydrates (rice, wheat, potatoes, bread), lean meats, yogurt, fruits, and vegetables.  Avoid high-fat foods because they are more difficult to digest.  Drink enough water and fluids to keep your urine clear or pale yellow.  If you are dehydrated, ask your caregiver for specific rehydration instructions. Signs of dehydration may include:  Severe thirst.  Dry lips and mouth.  Dizziness.  Dark urine.  Decreasing urine frequency and amount.  Confusion.  Rapid breathing or pulse. SEEK IMMEDIATE MEDICAL CARE IF:   You have blood or brown flecks (like coffee grounds) in your vomit.  You have black or bloody stools.  You have a severe headache or stiff neck.  You are confused.  You have severe abdominal pain.  You have chest pain or trouble breathing.  You do not urinate at least once every 8 hours.  You develop cold or clammy skin.  You continue to vomit for longer than 24 to 48 hours.  You have a fever. MAKE SURE YOU:   Understand these instructions.  Will watch your condition.  Will get help right away if you are not doing well or get worse. Document Released: 04/18/2005 Document Revised: 07/11/2011 Document Reviewed: 09/15/2010 [facility] Patient Information 2014 [place], [place].  Abdominal Pain Abdominal pain can be caused by many things. Your caregiver decides the seriousness of your pain by an examination and possibly blood tests and  X-rays. Many cases can be observed and treated at home. Most abdominal pain is not caused by a disease and will probably improve without treatment. However, in many cases, more time must pass before a clear cause of the pain can be found. Before that point, it may not be known if you need more testing, or if hospitalization or surgery is needed. HOME CARE INSTRUCTIONS   Do not take laxatives unless directed by your caregiver.  Take pain medicine only as directed by your caregiver.  Only  take over-the-counter or prescription medicines for pain, discomfort, or fever as directed by your caregiver.  Try a clear liquid diet (broth, tea, or water) for as long as directed by your caregiver. Slowly move to a bland diet as tolerated. SEEK IMMEDIATE MEDICAL CARE IF:   The pain does not go away.  You have a fever.  You keep throwing up (vomiting).  The pain is felt only in portions of the abdomen. Pain in the right side could possibly be appendicitis. In an adult, pain in the left lower portion of the abdomen could be colitis or diverticulitis.  You pass bloody or black tarry stools. MAKE SURE YOU:   Understand these instructions.  Will watch your condition.  Will get help right away if you are not doing well or get worse. Document Released: 01/26/2005 Document Revised: 07/11/2011 Document Reviewed: 12/05/2007 [facility] Patient Information 2014 [place], [place].

## 2012-11-20 NOTE — Assessment & Plan Note (Addendum)
Lab Results  Component Value Date   HGBA1C 6.6 11/20/2012   HGBA1C >14.0 08/02/2012   HGBA1C 8.4* 02/27/2012     Assessment: Diabetes control: good control (HgbA1C at goal) Progress toward A1C goal:  improved Comments: hyperglycemic today  Plan: Medications:  continue current medications Home glucose monitoring: Frequency:  (not checking no meter encouraged to purchase meter tid check tid but HA1C more controlled.  Can continue Metformin 1000 mg bid she did not tolerate Glpizide 10 mg caused her to feel like she was going to pass out Timing: none Instruction/counseling given: reminded to bring medications to each visit Educational resources provided: brochure;handout Self management tools provided:   Other plans: none

## 2012-11-20 NOTE — Assessment & Plan Note (Addendum)
Abdominal pain today is acute on chronic in left upper quadrant today radiating to back with h/o chronic pancreatitis though Dr. Benay Spice [facility]) last note 12/2011 (in media tab) states patient has pancreatic divisum w/recurrent acute pancreatitis s/p minor papillotomy and previous stent placement/removal.  His notes indicate that CT/MRI scans x 5 show pancreatitic divisum w/o evidence of acute pancreatitis at that time and no evidence of acute of chronic pancreatitis.  She has also had imaging and endoscopic studies that do not show a cause of pain per Dr. Benay Spice notes. Per the patient she last followed with Dr. Gery Pray 06/2012 but it is not in the care everywhere tab.    Etiology of LUQ abdominal pain unclear though could be acute on chronic pancreatitis  Will check stat labs CMET, CBC, lipase, Magnesium, UA, Urine culture, HA1C, cbg  Will admit to med surg for iv hydration, antiemetics (i.e phenergan), pain control. Patient is going to a pain clinic and taking Oxycodone 10 mg tid.  That clinic will probably have to be notified   Consider imaging

## 2012-11-20 NOTE — Assessment & Plan Note (Signed)
BP Readings from Last 3 Encounters:  11/20/12 155/86  08/02/12 154/88  04/02/12 126/84    Lab Results  Component Value Date   NA 140 03/18/2012   K 4.2 03/18/2012   CREATININE 0.58 03/18/2012    Assessment: Blood pressure control: mildly elevated (likely elevated due to pain) Progress toward BP goal:  unable to assess Comments: none  Plan: Medications:  continue current medications Educational resources provided: brochure;handout;video Self management tools provided: none  Other plans: reassess at follow up

## 2012-11-20 NOTE — Progress Notes (Addendum)
IV team had a difficult time getting an IV started on her. Physician ordered a NS bolus. IV team suggested bolus to go slow so that the vein doesn't blow. IV going at 200/hr. Physician aware.

## 2012-11-20 NOTE — H&P (Signed)
Date: 11/21/2012               Patient Name:  [name] MRN: [id number]  DOB: 1955-07-31 Age / Sex: 57 y.o., female   PCP: Kennis Carina, MD         Medical Service: Internal Medicine Teaching Service         Attending Physician: Dr. Rocco Serene, MD    First Contact: Dr. Windell Hummingbird, MD Pager: [phone]  Second Contact: Dr. Janalyn Harder, MD Pager: [phone]       After Hours (After 5p/  First Contact Pager: [phone]  weekends / holidays): Second Contact Pager: [phone]   Chief Complaint: Abdominal Pain  History of Present Illness: [name] is a 57yo AAF with a PMH of recurrent acute pancreatitis, pancreatic divisum s/p spincterotomy in 2011 and removal of stent in 2012, DM2, HTN, and dyslipidemia.  She presents to the [facility] today with left upper quadrant abdominal pain and was sent to the hospital for evaluation.    Patient reports she began feeling pain in her left upper quadrant on Saturday.  She subsequently felt nauseated and took some of her oxycodone and it provided little relief.  She has gradually felt worse over the past several days with decreased appetite and feels nauseated every time she tries to eat.  She ate a potato last night and had an episode of nonbloody diarrhea and felt her stomach "blew".  She describes the pain as sharp, 10/10, radiating to her back.  She admits to adhering to a strict diet of mainly fruits and vegetables with little meat.  She denies any alcohol use or any other recreational drug use.  She has not had any vomiting, fever, chills, or night sweats.  She denies any trauma, sick contacts, travel, or recent antibiotic use.  She is on lisinopril and HCTZ for hypertension.  She denies any urinary symptoms and had a hysterectomy.    She is followed by Dr. Rochel Brome at [facility].  Her last appt was in February 2014 and according to the patient they may be placing another stent.  In addition, an Abd [name] in June 2013 showed no  acute abnormality and Gastric Emptying Study in November 2013 was normal.  Her last CT Abd Pelvis Wo Contrast in November 2012 showed a small right groin and extraperitoneal hematoma on the right side, no retroperitoneal hematoma, and no other significant abdominal/pelvic findings.  Patient also goes to a pain clinic and is on oxycodone 10mg .     Meds: Prescriptions prior to admission  Medication Sig Dispense Refill  . calcium carbonate (TUMS - DOSED IN MG ELEMENTAL CALCIUM) 500 MG chewable tablet Chew 1 tablet by mouth as needed for heartburn.      . lipase/protease/amylase (CREON-12/PANCREASE) 12000 UNITS CPEP Take 1-2 capsules by mouth 3 (three) times daily with meals. Takes 2 capsules with meals and 1 capsule with snacks      . lisinopril-hydrochlorothiazide (PRINZIDE,ZESTORETIC) 20-12.5 MG per tablet Take 1 tablet by mouth every evening.      . loperamide (IMODIUM A-D) 2 MG tablet Take 2 mg by mouth 4 (four) times daily as needed for diarrhea or loose stools.      . metFORMIN (GLUCOPHAGE) 1000 MG tablet Take 1,000 mg by mouth 2 (two) times daily with a meal.      . omeprazole (PRILOSEC) 20 MG capsule Take 20 mg by mouth daily as needed (for acid reflux).       . Oxycodone  HCl 10 MG TABS Take 10 mg by mouth every 8 (eight) hours as needed (for pain).       . polyethylene glycol (MIRALAX / GLYCOLAX) packet Take 17 g by mouth daily as needed (for constipation).      . promethazine (PHENERGAN) 12.5 MG tablet Take 12.5 mg by mouth every 6 (six) hours as needed for nausea.        Allergies: Allergies as of 11/20/2012 - Review Complete 11/20/2012  Allergen Reaction Noted  . Fioricet (butalbital-apap-caffeine) Other (See Comments)   . Shrimp flavor Anaphylaxis 08/25/2010  . Sulfonamide derivatives Rash 07/13/2006  . Aspirin  11/20/2012   Past Medical History  Diagnosis Date  . Hypertension   . HLD (hyperlipidemia) 2009  . Sickle cell trait   . Pulmonary embolus 08/2004    Two areas of V/Q  mismatch. Findings compatible  with high probability for pulmonary embolus.; pt was on coumadin for 1 year  . Pancreatic divisum     S/P ERCP with stenting 07/2010 at [facility], then stent removal (08/05/2010)  . CVA (cerebral infarction) 1993    Per report by patient she had a stroke and prolonged rehab course eventually resolving her right sided weakness, as per her hx obtained 08/2007  . Blood dyscrasia     thalasemia, sickle cell trait  . Chronic abdominal pain     Unclear etiology, thought to be due to chronic pancreatitis previously in setting of her pancreatic divisum - however, EUS and EGD performed at [facility]  (10/05/2011) showing normal esophageall, gastric, duodenal mucosa. EUS showing no pancreatic masses, cysts, or changes of chronic pancreatitis. Biliary system nondilated and had no endosonographic abnormalities.  . Type II diabetes mellitus 2010    well controlled  . Asthma     childhood asthma  . Uterine cancer   . Pneumonia ~ 1969  . Beta thalassemia   . Thalassemia   . Migraine headache     "q other year now" (11/20/2012)  . Seizures     "after the stroke in 1993; none for years now" 11/20/2012)  . Stroke 1993    very small amount of left sided weakness   Past Surgical History  Procedure Laterality Date  . Pancreatic stent placement/removal      placed in 2011; removed in 2012/H&P (02/27/2012)  . Abdominal hysterectomy  1980    2/2 endometriosis  . Breast lumpectomy Bilateral 1980's    "in my milk ducts; both benign" (02/27/2012)  . Sphincterotomy      Hattie Perch 11/20/2012  . Appendectomy  ?1980    "I think I've had it out" (11/20/2012)   Family History  Problem Relation Age of Onset  . Prostate cancer Father   . Cancer Father     stomach ca died x 1 year ago  . Sickle cell anemia Brother   . Lung cancer Maternal Aunt   . Lung cancer Maternal Uncle   . Breast cancer Maternal Grandmother   . Heart disease Maternal Grandfather   . Heart disease Paternal  Grandfather   . Diabetes Other   . Cancer Other     multiple cancers pancreas,colon, breast   History   Social History  . Marital Status: Married    Spouse Name: N/A    Number of Children: N/A  . Years of Education: N/A   Occupational History  . Not on file.   Social History Main Topics  . Smoking status: Former Smoker -- 0.50 packs/day for 30 years  Types: Cigarettes    Quit date: 01/01/1999  . Smokeless tobacco: Never Used  . Alcohol Use: No  . Drug Use: No  . Sexually Active: Yes   Other Topics Concern  . Not on file   Social History Narrative   Works at [employer] as a Merchandiser, retail, on Health visitor about 9 hours/ day    Review of Systems: Pertinent items are noted in HPI.  Vital Signs: BP:  161/85 Pulse:  81  Temp:  98 F (oral) RR:  16 O2 Sat:  96% on RA  Physical Exam  Constitutional: She is oriented to person, place, and time. She appears well-developed and well-nourished. She appears distressed.  HENT:  Head: Normocephalic and atraumatic.  Eyes: Conjunctivae are normal.  Cardiovascular: Normal rate, regular rhythm, normal heart sounds and intact distal pulses.  Exam reveals no gallop and no friction rub.   No murmur heard. Respiratory: Effort normal and breath sounds normal. No respiratory distress.  GI: Soft. Bowel sounds are normal. She exhibits no distension. There is tenderness in the left upper quadrant. There is no rebound, no guarding and no CVA tenderness.  Neurological: She is alert and oriented to person, place, and time.  Skin: Skin is warm and dry. No rash noted. She is not diaphoretic.    Lab results: Basic Metabolic Panel:  Recent Labs  16/10/96 1614  NA 136  K 4.6  CL 96  CO2 25  GLUCOSE 91  BUN 11  CREATININE 0.65  CALCIUM 10.7*  MG 1.5   Liver Function Tests:  Recent Labs  11/20/12 1614  AST 18  ALT 10  ALKPHOS 62  BILITOT 0.4  PROT 8.3  ALBUMIN 4.3    Recent Labs  11/20/12 1614  LIPASE 47   CBC:  Recent  Labs  11/20/12 1614  WBC 8.5  NEUTROABS 3.8  HGB 13.9  HCT 39.8  MCV 75.4*  PLT 335   CBG:  Recent Labs  11/20/12 1814 11/20/12 2137  GLUCAP 82 96   Hemoglobin A1C:  Recent Labs  11/20/12 1630  HGBA1C 6.6   Urine Drug Screen: Drugs of Abuse     Component Value Date/Time   LABOPIA POSITIVE* 11/20/2012 2030   LABOPIA NEG 08/30/2011 1526   COCAINSCRNUR NONE DETECTED 11/20/2012 2030   COCAINSCRNUR NEG 08/30/2011 1526   LABBENZ NONE DETECTED 11/20/2012 2030   LABBENZ NEG 08/30/2011 1526   LABBENZ NEG 01/24/2011 1414   AMPHETMU NONE DETECTED 11/20/2012 2030   AMPHETMU NEG 01/24/2011 1414   THCU NONE DETECTED 11/20/2012 2030   LABBARB NONE DETECTED 11/20/2012 2030   LABBARB NEG 08/30/2011 1526    Urinalysis:  Recent Labs  11/20/12 1652  COLORURINE YELLOW  LABSPEC 1.008  PHURINE 5.0  GLUCOSEU NEG  HGBUR NEG  BILIRUBINUR NEG  KETONESUR NEG  PROTEINUR NEG  UROBILINOGEN 0.2  NITRITE NEG  LEUKOCYTESUR SMALL*   Assessment & Plan by Problem:  [name] is a 57yo AAF with a PMH of pancreatic divisum, recurrent acute pancreatitis s/p minor papillotomy and previous stent placement/removal, chronic abdominal pain, DM2, HTN, and dyslipidemia who presents to the [facility] today with left upper quadrant pain.    1. Abdominal Pain: Patient has a h/o recurrent acute pancreatitis and chronic abdominal pain and has had multiple admissions according to notes in her chart.  She's had an extensive workup in the past to find the etiology of her pain but to no avail.  She saw Dr. Gery Pray on 12/2011 and he recommended starting  bentyl at that time for possible IBS. She underwent EGD and EUSat [facility] with no evidence suggestive of chronic pancreatitis.  She had 5 CT/MRI scans which showed pancreatic divisum without finding of chronic pancreatitis.  In addition, Dr. Gery Pray comments that her lipase/amylase values were never over 3X the upper limit of normal.  She saw Dr. Gery Pray again on 06/2012 with  improvement of pain.  He recommended she start a fiber supplementation and levsin.  However, it appears that she is not currently taking these medications.  Acute pancreatitis unlikely given her normal lipase and no other lab abnormalities.  Gastroparesis could be a possibility but the patient's recent emptying study on 03/2012 was normal making this unlikely.  PUD unlikely given the patient's nl EGD.  Cholecystitis unlikely given normal Abdominal US in 10/2011.  Pain is unlikely cardiac in origin given that it is nonexertional, localized to the LUQ, tender to palpation, is continuous and sharp.  -NPO -1L NS bolus -NS 187ml/hr -morphine 2-4mg  q3 PRN for pain -orthostatic vitals  -CMET -UDS -EtOH level  2. Nausea:  -phenergan 12.5mg  q6 PRN   3. DM2:  HA1C: 6.6 on 11/20/12   -hold metformin -SSI -cbg 4 times daily before meals and at bedtime  3. Hypertension:  -lisinopril 20mg  qhs -hydrochlorothiazide 12.5mg  qhs  4. Hyperlipidemia:  TC: 260 / TRIG: 126 / HDL: 89 / LDL: 146  on 08/02/12  -begin pt on statin   5. VTE prophylaxis:    -SCDs -Heparin 5000 Units every 8 hrs  Dispo: Disposition is deferred at this time, awaiting improvement of current medical problems. Anticipated discharge in approximately 2-3 day(s).   The patient does have a current PCP (Kennis Carina, MD) and does need an [facility] hospital follow-up appointment after discharge.  Signed: Boykin Peek, MD 11/21/2012, 4:27 AM

## 2012-11-20 NOTE — Assessment & Plan Note (Signed)
Lipid Panel     Component Value Date/Time   CHOL 260* 08/02/2012 1706   TRIG 126 08/02/2012 1706   HDL 89 08/02/2012 1706   CHOLHDL 2.9 08/02/2012 1706   VLDL 25 08/02/2012 1706   LDLCALC 146* 08/02/2012 1706   Patient is not taking Pravachol.  Which she should be taking.  She will need Rx at discharge LDL goal <100

## 2012-11-21 DIAGNOSIS — G8929 Other chronic pain: Secondary | ICD-10-CM

## 2012-11-21 LAB — GLUCOSE, CAPILLARY
Glucose-Capillary: 72 mg/dL (ref 70–99)
Glucose-Capillary: 74 mg/dL (ref 70–99)

## 2012-11-21 LAB — COMPREHENSIVE METABOLIC PANEL
Alkaline Phosphatase: 48 U/L (ref 39–117)
BUN: 8 mg/dL (ref 6–23)
CO2: 24 mEq/L (ref 19–32)
Chloride: 107 mEq/L (ref 96–112)
Creatinine, Ser: 0.61 mg/dL (ref 0.50–1.10)
GFR calc non Af Amer: >90 mL/min (ref >90)
Total Bilirubin: 0.4 mg/dL (ref 0.3–1.2)

## 2012-11-21 LAB — RAPID URINE DRUG SCREEN, HOSP PERFORMED
Amphetamines: NOT DETECTED
Benzodiazepines: NOT DETECTED
Opiates: POSITIVE — AB

## 2012-11-21 LAB — CBC
MCH: 25.6 pg — ABNORMAL LOW (ref 26.0–34.0)
Platelets: 329 10*3/uL (ref 150–400)
RBC: 4.41 MIL/uL (ref 3.87–5.11)
RDW: 15 % (ref 11.5–15.5)
WBC: 5.6 10*3/uL (ref 4.0–10.5)

## 2012-11-21 MED ORDER — BOOST / RESOURCE BREEZE PO LIQD
1.0000 | Freq: Every day | ORAL | Status: DC
  Administered 2012-11-21 – 2012-11-24 (×4): 1 via ORAL

## 2012-11-21 MED ORDER — OXYCODONE HCL 5 MG PO TABS
10.0000 mg | ORAL_TABLET | ORAL | Status: DC | PRN
  Administered 2012-11-21 – 2012-11-24 (×18): 10 mg via ORAL
  Filled 2012-11-21 (×5): qty 2
  Filled 2012-11-21: qty 1
  Filled 2012-11-21 (×5): qty 2
  Filled 2012-11-21 (×2): qty 1
  Filled 2012-11-21 (×2): qty 2
  Filled 2012-11-21: qty 1
  Filled 2012-11-21 (×4): qty 2

## 2012-11-21 NOTE — Progress Notes (Addendum)
INITIAL NUTRITION ASSESSMENT  DOCUMENTATION CODES Per approved criteria  -Not Applicable   INTERVENTION:  Resource Breeze daily (250 kcals, 9 gm protein per 8 fl oz carton) RD to follow for nutrition care plan  NUTRITION DIAGNOSIS: Inadequate oral intake related to decreased appetite, nausea as evidenced by patient report  Goal: Oral intake with meals & supplements to meet >/= 90% of estimated nutrition needs  Monitor:  PO & supplemental intake, weight, labs, I/O's  Reason for Assessment: Malnutrition Screening Tool Report  57 y.o. female  Admitting Dx: Relapsing chronic pancreatitis  ASSESSMENT: Patient with PMH of recurrent acute pancreatitis, pancreatic divisum s/p spincterotomy in 2011 and removal of stent in 2012, DM2, HTN, and dyslipidemia; presented to [facility] with left upper quadrant abdominal pain.  Patient reports a decreased appetite; she did eat something Monday night; + nausea,; also reports she's lost weight; per records, patient has had a 7% weight loss since April 2014 ---> not significant for time frame.  RD discussed addition of clear liquid, low fat supplement (ie Resource Breeze) during visitation, however, patient NPO at that time; she seemed amenable to this; diet advanced to [facility] 1118 ---> RD to order.  Height: Ht Readings from Last 1 Encounters:  11/20/12 5\' 2"  (1.575 m)    Weight: Wt Readings from Last 1 Encounters:  11/20/12 106 lb 9.5 oz (48.35 kg)    Ideal Body Weight: 110 lb  % Ideal Body Weight: 96%  Wt Readings from Last 10 Encounters:  11/20/12 106 lb 9.5 oz (48.35 kg)  11/20/12 105 lb 14.4 oz (48.036 kg)  08/02/12 114 lb 12.8 oz (52.073 kg)  04/02/12 118 lb 14.4 oz (53.933 kg)  03/27/12 116 lb 8 oz (52.844 kg)  03/21/12 121 lb 3.2 oz (54.976 kg)  03/18/12 125 lb 10.6 oz (57 kg)  03/12/12 119 lb 4.8 oz (54.114 kg)  02/28/12 125 lb (56.7 kg)  11/16/11 116 lb 12.8 oz (52.98 kg)    Usual Body Weight: 114 lb  % Usual Body Weight:  93%  BMI:  Body mass index is 19.49 kg/(m^2).  Estimated Nutritional Needs: Kcal: 1450-1650  Protein: 65-75 gm Fluid: 1.4-1.6 L  Skin: Intact  Diet Order: Parke Simmers  EDUCATION NEEDS: -No education needs identified at this time   Intake/Output Summary (Last 24 hours) at 11/21/12 1144 Last data filed at 11/21/12 0600  Gross per 24 hour  Intake   1775 ml  Output    450 ml  Net   1325 ml    Labs:   Recent Labs Lab 11/20/12 1614 11/21/12 0525  NA 136 141  K 4.6 4.1  CL 96 107  CO2 25 24  BUN 11 8  CREATININE 0.65 0.61  CALCIUM 10.7* 9.1  MG 1.5  --   GLUCOSE 91 75    CBG (last 3)   Recent Labs  11/20/12 2137 11/21/12 0728 11/21/12 1131  GLUCAP 96 70 62*    Scheduled Meds: . heparin  5,000 Units Subcutaneous Q8H  . hydrochlorothiazide  12.5 mg Oral QHS  . insulin aspart  0-9 Units Subcutaneous TID WC  . lisinopril  20 mg Oral QHS    Continuous Infusions: . sodium chloride 150 mL/hr at 11/21/12 0205    Past Medical History  Diagnosis Date  . Hypertension   . HLD (hyperlipidemia) 2009  . Sickle cell trait   . Pulmonary embolus 08/2004    Two areas of V/Q mismatch. Findings compatible  with high probability for pulmonary embolus.; pt was on  coumadin for 1 year  . Pancreatic divisum     S/P ERCP with stenting 07/2010 at [facility], then stent removal (08/05/2010)  . CVA (cerebral infarction) 1993    Per report by patient she had a stroke and prolonged rehab course eventually resolving her right sided weakness, as per her hx obtained 08/2007  . Blood dyscrasia     thalasemia, sickle cell trait  . Chronic abdominal pain     Unclear etiology, thought to be due to chronic pancreatitis previously in setting of her pancreatic divisum - however, EUS and EGD performed at [facility]  (10/05/2011) showing normal esophageall, gastric, duodenal mucosa. EUS showing no pancreatic masses, cysts, or changes of chronic pancreatitis. Biliary system nondilated and had no  endosonographic abnormalities.  . Type II diabetes mellitus 2010    well controlled  . Asthma     childhood asthma  . Uterine cancer   . Pneumonia ~ 1969  . Beta thalassemia   . Thalassemia   . Migraine headache     "q other year now" (11/20/2012)  . Seizures     "after the stroke in 1993; none for years now" 11/20/2012)  . Stroke 1993    very small amount of left sided weakness    Past Surgical History  Procedure Laterality Date  . Pancreatic stent placement/removal      placed in 2011; removed in 2012/H&P (02/27/2012)  . Abdominal hysterectomy  1980    2/2 endometriosis  . Breast lumpectomy Bilateral 1980's    "in my milk ducts; both benign" (02/27/2012)  . Sphincterotomy      Hattie Perch 11/20/2012  . Appendectomy  ?1980    "I think I've had it out" (11/20/2012)    Maureen Chatters, RD, LDN Pager #: [phone] After-Hours Pager #: [phone]

## 2012-11-21 NOTE — Progress Notes (Addendum)
Subjective: [name] continues to have sharp, constant LUQ abd pain that radiates to her back, denies N/V/D. Last episode of watery diarrhea was yesterday morning. She reports that this is her typical abd pain that she usually has intermittently and that it improves after a few days of NPO. She has not tried eating anything since Monday. Patient agrees to try eating today. Patient endorses having some sweating, but she is not sure if this is associated with diarrhea and she denies having episodes of flushing. She follows a strict bland diet consisting of pasta, rice, and lettuce. This diet has improved her symptoms. She has never used a diet diary in the past.   Objective: Vital signs in last 24 hours: Filed Vitals:   11/20/12 1804 11/20/12 2143 11/21/12 0531  BP: 161/85 101/53 129/62  Pulse: 81 76 79  Temp: 98 F (36.7 C) 97.6 F (36.4 C) 98 F (36.7 C)  TempSrc: Oral Oral Oral  Resp: 17 16 17   Height: 5\' 2"  (1.575 m)    Weight: 106 lb 9.5 oz (48.35 kg)    SpO2: 100% 96% 92%    Intake/Output Summary (Last 24 hours) at 11/21/12 1316 Last data filed at 11/21/12 0600  Gross per 24 hour  Intake   1775 ml  Output    450 ml  Net   1325 ml   Physical Exam General: alert, cooperative, appears to be in some pain HEENT: vision grossly intact, oropharynx clear and non-erythematous, MMM Neck: supple Lungs: clear to ascultation bilaterally, normal work of respiration, no wheezes, rales, ronchi Heart: regular rate and rhythm, no murmurs, gallops, or rubs Abdomen: soft, ttp in LUQ and L flank, non-distended, normal bowel sounds; no CVA tenderness Extremities: warm extremities, no cyanosis, clubbing, or edema Neurologic: alert & oriented X3, strength and sensation grossly intact  Lab Results: Basic Metabolic Panel:  Recent Labs Lab 11/20/12 1614 11/21/12 0525  NA 136 141  K 4.6 4.1  CL 96 107  CO2 25 24  GLUCOSE 91 75  BUN 11 8  CREATININE 0.65 0.61  CALCIUM 10.7* 9.1   MG 1.5  --    Liver Function Tests:  Recent Labs Lab 11/20/12 1614 11/21/12 0525  AST 18 18  ALT 10 7  ALKPHOS 62 48  BILITOT 0.4 0.4  PROT 8.3 6.6  ALBUMIN 4.3 3.4*    Recent Labs Lab 11/20/12 1614  LIPASE 47   CBC:  Recent Labs Lab 11/20/12 1614 11/21/12 0525  WBC 8.5 5.6  NEUTROABS 3.8  --   HGB 13.9 11.3*  HCT 39.8 33.0*  MCV 75.4* 74.8*  PLT 335 329   CBG:  Recent Labs Lab 11/20/12 1543 11/20/12 1814 11/20/12 2137 11/21/12 0728 11/21/12 1131 11/21/12 1157  GLUCAP 72 82 96 70 62* 74   Hemoglobin A1C:  Recent Labs Lab 11/20/12 1630  HGBA1C 6.6   Urine Drug Screen: Drugs of Abuse     Component Value Date/Time   LABOPIA POSITIVE* 11/20/2012 2030   LABOPIA NEG 08/30/2011 1526   COCAINSCRNUR NONE DETECTED 11/20/2012 2030   COCAINSCRNUR NEG 08/30/2011 1526   LABBENZ NONE DETECTED 11/20/2012 2030   LABBENZ NEG 08/30/2011 1526   LABBENZ NEG 01/24/2011 1414   AMPHETMU NONE DETECTED 11/20/2012 2030   AMPHETMU NEG 01/24/2011 1414   THCU NONE DETECTED 11/20/2012 2030   LABBARB NONE DETECTED 11/20/2012 2030   LABBARB NEG 08/30/2011 1526    Alcohol Level:  Recent Labs Lab 11/20/12 2013  ETH <11   Urinalysis:  Recent Labs Lab 11/20/12 1652  COLORURINE YELLOW  LABSPEC 1.008  PHURINE 5.0  GLUCOSEU NEG  HGBUR NEG  BILIRUBINUR NEG  KETONESUR NEG  PROTEINUR NEG  UROBILINOGEN 0.2  NITRITE NEG  LEUKOCYTESUR SMALL*   Medications: I have reviewed the patient's current medications. Scheduled Meds: . feeding supplement  1 Container Oral Q lunch  . heparin  5,000 Units Subcutaneous Q8H  . hydrochlorothiazide  12.5 mg Oral QHS  . insulin aspart  0-9 Units Subcutaneous TID WC  . lisinopril  20 mg Oral QHS   Continuous Infusions: . sodium chloride 150 mL/hr at 11/21/12 0205   PRN Meds:.oxyCODONE, promethazine  Assessment/Plan: [name] is a 57yo AAF with a PMH of pancreatic divisum, recurrent acute pancreatitis s/p minor papillotomy and  previous stent placement/removal, chronic abdominal pain, DM2, HTN, and dyslipidemia who presents with LUQ pain of unclear origin.  1. Abdominal Pain:  Patient has a h/o recurrent acute pancreatitis and chronic LUQ abdominal pain of unclear origin for which she has been hospitalized many times in the past. Previous workup is as follows:  Patient underwent EGD, which was normal, and EUS which showed pancreatic divisum, but no evidence suggestive of chronic pancreatitis (approx 10/2011).She has had 5 CT/MRI scans in the past which showed pancreatic divisum without finding of chronic pancreatitis. In addition, Dr. Gery Pray comments that her lipase/amylase values were never over 3X the upper limit of normal. She saw Dr. Gery Pray on 12/2011 and he recommended starting bentyl at that time for possible IBS, patient reports that the bentyl did not help her symptoms so she stopped taking it. She saw Dr. Gery Pray again on 06/2012 at which time he recommended she start a fiber supplementation and levsin. However, it appears that she is not currently taking these medications.   The current episode of abd pain is not thought to be acute pancreatitis given her normal lipase and no other lab abnormalities. Patient had a normal emptying study on 03/2012, so gastroparesis is unlikely. Additionally, her diabetes appears to be much more well controlled now than in the past, HbA1c 6.6% during this admission. PUD unlikely given the patient's nl EGD last year, though things could have changed since this time. Cholecystitis unlikely given normal abdominal US in 10/2011 and that her pain is localized on the L side rather than R.  Pain is unlikely cardiac in origin given that it is nonexertional, localized to the LUQ, tender to palpation, is continuous and sharp. Additional things to consider are somatization  Disorder/somatic symptom disorder (given her h/o multiple complaints without clear pathology) and IBS given her intermittent diarrhea,  though patient does endorse that sometimes the diarrhea wakes her up at night and having bowel movements does not seem to improve her abd pain. Diverticulitis, colon cancer, other colonic pathology should be considered, but is unlikely given last colonoscopy in our records was in 2013 and normal. Carcinoid syndrome and VIPoma have been considered. However, patient does not endorse having episodes of flushing that would be seen with carcinoid syndrome and she does not have hypokalemia consistent with VIPoma. We are also considering mesenteric angina/ischemia, given her pain does seem to worsen with food intake and that her pain does appear to be out of proportion to physical exam. -advance to clears   -NS 175ml/hr  -transition to PO pain medications today- oxycodone 10mg  q4prn -CMET unremarkable -lipase 47 (wnl)  -UDS + for optiates as expected  -lactic acid level  2. Nausea: Seems to worsen with PO intake. No  vomiting.  -phenergan 12.5mg  q6 PRN   3. DM2: HA1C: 6.6 on 11/20/12  -hold metformin  -SSI  -cbg 4 times daily before meals and at bedtime   3. Hypertension: Chronic, stable. -lisinopril 20mg  qhs  -hydrochlorothiazide 12.5mg  qhs   4. Hyperlipidemia: TC: 260 / TRIG: 126 / HDL: 89 / LDL: 146 on 08/02/12  -begin pt on statin upon discharge  5. VTE prophylaxis:  -SCDs  -Heparin 5000 Units every 8 hrs  Dispo: Disposition is deferred at this time, awaiting improvement of current medical problems.  Anticipated discharge in approximately 1-2 day(s).   The patient does have a current PCP (Kennis Carina, MD) and does not need an [facility] hospital follow-up appointment after discharge.  The patient does not have transportation limitations that hinder transportation to clinic appointments.  .Services Needed at time of discharge: Y = Yes, Blank = No PT:   OT:   RN:   Equipment:   Other:     LOS: 1 day   Windell Hummingbird, MD 11/21/2012, 1:16 PM

## 2012-11-21 NOTE — Progress Notes (Signed)
Case discussed with Dr. McLean at the time of the visit.  We reviewed the resident's history and exam and pertinent patient test results.  I agree with the assessment, diagnosis, and plan of care documented in the resident's note.     

## 2012-11-21 NOTE — Progress Notes (Signed)
UR completed 

## 2012-11-21 NOTE — H&P (Signed)
I saw and evaluated the patient. I reviewed the resident's note and confirmed the resident's findings.  I agree with the assessment and plan as documented in the resident's note.  [name] presents with recurrent abdominal pain.  In the past it had been presumed to be secondary to pancreatitis, but GI at [facility] is not leaning towards the pancreas causing her symptoms.  They are leaning towards irritable bowel syndrome given the alternating constipation and diarrhea associated with abdominal pain, improvement with the modification in her diet, and the negative extensive work-up to date.  This time her lipase is within the normal range.  I am also concerned about irritable bowel syndrome as a cause of her current pain.  Unfortunately, she did not respond to bentyl in the past.  She may benefit from SSRI therapy which has been reported to be helpful in some patients with irritable bowel syndrome and we will give this a try.  Although IBS is the working diagnosis, the differential includes somatoform disorder and hypercalcemia.  We will see if she meets the diagnostic criteria for somatoform disorder and stop her calcium and HCTZ to address her hypercalcemia.  I anticipate she will be stable for discharge in the next 24-48 hours as we slowly advance her diet as tolerated.

## 2012-11-22 LAB — GLUCOSE, CAPILLARY: Glucose-Capillary: 115 mg/dL — ABNORMAL HIGH (ref 70–99)

## 2012-11-22 MED ORDER — PANCRELIPASE (LIP-PROT-AMYL) 12000-38000 UNITS PO CPEP
1.0000 | ORAL_CAPSULE | ORAL | Status: DC | PRN
  Filled 2012-11-22: qty 1

## 2012-11-22 MED ORDER — PANCRELIPASE (LIP-PROT-AMYL) 12000-38000 UNITS PO CPEP
2.0000 | ORAL_CAPSULE | Freq: Three times a day (TID) | ORAL | Status: DC
  Administered 2012-11-22 – 2012-11-24 (×7): 2 via ORAL
  Filled 2012-11-22 (×9): qty 2

## 2012-11-22 NOTE — Progress Notes (Signed)
Internal Medicine Attending  Date: 11/22/2012  Patient name: [name] Medical record number: [id number] Date of birth: 1955-09-23 Age: 57 y.o. Gender: female  I saw and evaluated the patient. I reviewed the resident's note by Dr. Darci Needle and I agree with the resident's findings and plans as documented in her progress note.  [name] continues to have abdominal pain but it is improved from yesterday and she has tolerated clears.  She is interested in advancing her diet for lunch today.  We will do so as tolerated.  Please see Dr. Hollace Kinnier excellent assessment which outlines in detail her working diagnosis and reasonable differential and reasoning for such.

## 2012-11-22 NOTE — Progress Notes (Signed)
Subjective: [name] continues to have sharp, constant LUQ abd pain that radiates to her back although the pain is better than it was yesterday (now 6/10, yesterday had been 10/10). She has some associated nausea, but she has been able to keep down a clear diet since yesterday afternoon with only moderate pain and slight nausea. Patient thinks she will be ready to advance her diet this afternoon around lunch time.  Objective: Vital signs in last 24 hours: Filed Vitals:   11/21/12 2215 11/21/12 2220 11/21/12 2224 11/22/12 0539  BP: 126/75 113/61 140/69 121/89  Pulse: 75 74 71 72  Temp:  98.1 F (36.7 C)  98.1 F (36.7 C)  TempSrc:  Oral  Oral  Resp: 16 16  16   Height:      Weight:      SpO2:  98%  100%    Intake/Output Summary (Last 24 hours) at 11/22/12 0810 Last data filed at 11/22/12 0600  Gross per 24 hour  Intake   3600 ml  Output      0 ml  Net   3600 ml   Physical Exam General: alert, cooperative, appears comfortable HEENT: vision grossly intact, oropharynx clear and non-erythematous, MMM Neck: supple Lungs: clear to ascultation bilaterally, normal work of respiration Heart: regular rate and rhythm, no murmurs, gallops, or rubs Abdomen: soft, ttp in LUQ and L flank, non-distended, normal bowel sounds; no CVA tenderness Extremities: warm extremities, no cyanosis, clubbing, or edema Neurologic: alert & oriented X3, strength and sensation grossly intact  Lab Results: Basic Metabolic Panel:  Recent Labs Lab 11/20/12 1614 11/21/12 0525  NA 136 141  K 4.6 4.1  CL 96 107  CO2 25 24  GLUCOSE 91 75  BUN 11 8  CREATININE 0.65 0.61  CALCIUM 10.7* 9.1  MG 1.5  --    Liver Function Tests:  Recent Labs Lab 11/20/12 1614 11/21/12 0525  AST 18 18  ALT 10 7  ALKPHOS 62 48  BILITOT 0.4 0.4  PROT 8.3 6.6  ALBUMIN 4.3 3.4*   Lactic acid- 1.1  Recent Labs Lab 11/20/12 1614  LIPASE 47   CBC:  Recent Labs Lab 11/20/12 1614 11/21/12 0525  WBC  8.5 5.6  NEUTROABS 3.8  --   HGB 13.9 11.3*  HCT 39.8 33.0*  MCV 75.4* 74.8*  PLT 335 329   CBG:  Recent Labs Lab 11/21/12 0728 11/21/12 1131 11/21/12 1157 11/21/12 1711 11/21/12 2145 11/22/12 0719  GLUCAP 70 62* 74 157* 173* 115*   Hemoglobin A1C:  Recent Labs Lab 11/20/12 1630  HGBA1C 6.6   Urine Drug Screen: Drugs of Abuse     Component Value Date/Time   LABOPIA POSITIVE* 11/20/2012 2030   LABOPIA NEG 08/30/2011 1526   COCAINSCRNUR NONE DETECTED 11/20/2012 2030   COCAINSCRNUR NEG 08/30/2011 1526   LABBENZ NONE DETECTED 11/20/2012 2030   LABBENZ NEG 08/30/2011 1526   LABBENZ NEG 01/24/2011 1414   AMPHETMU NONE DETECTED 11/20/2012 2030   AMPHETMU NEG 01/24/2011 1414   THCU NONE DETECTED 11/20/2012 2030   LABBARB NONE DETECTED 11/20/2012 2030   LABBARB NEG 08/30/2011 1526    Alcohol Level:  Recent Labs Lab 11/20/12 2013  ETH <11   Urinalysis:  Recent Labs Lab 11/20/12 1652  COLORURINE YELLOW  LABSPEC 1.008  PHURINE 5.0  GLUCOSEU NEG  HGBUR NEG  BILIRUBINUR NEG  KETONESUR NEG  PROTEINUR NEG  UROBILINOGEN 0.2  NITRITE NEG  LEUKOCYTESUR SMALL*   Medications: I have reviewed the patient's current  medications. Scheduled Meds: . feeding supplement  1 Container Oral Q lunch  . heparin  5,000 Units Subcutaneous Q8H  . insulin aspart  0-9 Units Subcutaneous TID WC  . lipase/protease/amylase  1-2 capsule Oral TID WC  . lisinopril  20 mg Oral QHS   Continuous Infusions:   PRN Meds:.oxyCODONE, promethazine  Assessment/Plan: [name] is a 57yo AAF with a PMH of pancreatic divisum, recurrent acute pancreatitis s/p minor papillotomy and previous stent placement/removal, chronic abdominal pain, DM2, HTN, and dyslipidemia who presents with LUQ pain, nausea and diarrhea- identical to previous episodes.  1. Abdominal Pain:  Patient has a h/o recurrent acute pancreatitis and chronic LUQ abdominal pain of unclear origin for which she has been hospitalized  many times in the past. Previous workup is as follows:  Patient underwent EGD, which was normal, and EUS which showed pancreatic divisum, but no evidence suggestive of chronic pancreatitis (approx 10/2011).She has had 5 CT/MRI scans in the past which showed pancreatic divisum without findings of chronic pancreatitis. In addition, Dr. Gery Pray at [facility] comments that her lipase/amylase values were never over 3X the upper limit of normal. She saw Dr. Gery Pray on 12/2011 and he recommended starting bentyl at that time for possible IBS, patient reports that the bentyl did not help her symptoms so she stopped taking it. She saw Dr. Gery Pray again on 06/2012 at which time he recommended she start a fiber supplementation and levsin. However, it appears that she is not currently taking these medications.   The current episode of abd pain is not thought to be acute pancreatitis given her normal lipase and no other lab abnormalities. Patient had a normal emptying study on 03/2012, so gastroparesis is unlikely. Additionally, her diabetes appears to be much more well controlled now than in the past, HbA1c 6.6% during this admission. PUD unlikely given the patient's nl EGD last year, though things could have changed since this time. Cholecystitis unlikely given normal abdominal US in 10/2011 and that her pain is localized on the L side rather than R.  Pain is unlikely cardiac in origin given that it is nonexertional, localized to the LUQ, tender to palpation, is continuous and sharp. Diverticulitis, colon cancer, other colonic pathology should be considered, but is unlikely given last colonoscopy in our records was in 2013 and normal. Carcinoid syndrome and VIPoma have been considered. However, patient does not endorse having episodes of flushing that would be seen with carcinoid syndrome and she does not have hypokalemia consistent with VIPoma- additionally, previous imaging studies do not make mention of any masses. We considered  mesenteric infaction, given her pain does seem to worsen with food intake, but lactic acid was within normal limits. Cannot rule out mesenteric ischemia at this time, but her pain does not seem to be out of proportion to physical exam. Additional chart review shows patient was negative for endomysial IgA and tissue transglutaminase IgA in 2008, so her current pain and diarrhea are unlikely to be a result of celiac disease.   At this point the two items on my differential that I am leaning toward are somatization disorder/somatic symptom disorder (given her h/o multiple complaints without clear pathology) and IBS given her intermittent diarrhea, though patient does endorse that sometimes the diarrhea wakes her up at night and having bowel movements does not seem to improve her abd pain.We discussed starting patient on an SSRI, but given that her clinical picture is still relatively unclear and that she is closely followed by GI at  [facility], we will hold off for now. I spoke with patient regarding goals of care for this admission and she would like her pain to be improved and to tolerate her normal diet; she is looking forward to following up at [facility] with hopes of being referred to [facility] for further work up. -continue clear liquid diet for this morning and advance to bland diet this afternoon -discontinue IVF  -oxycodone 10mg  PO q4prn -UDS + for optiates as expected  -lactic acid level wnl  2. Nausea: Seems to worsen with PO intake. No vomiting.  -phenergan 12.5mg  q6 PRN   3. DM2: HA1C: 6.6 on 11/20/12  -hold metformin  -SSI  -cbg 4 times daily before meals and at bedtime   3. Hypertension: Chronic, stable. BP at goal. -lisinopril 20mg  qhs  -hydrochlorothiazide 12.5mg  qhs   4. Hyperlipidemia: TC: 260 / TRIG: 126 / HDL: 89 / LDL: 146 on 08/02/12  -begin pt on statin upon discharge  5. VTE prophylaxis:  -SCDs  -Heparin 5000 Units every 8 hrs  Dispo: Disposition is deferred at this time, awaiting  improvement of current medical problems.  Anticipated discharge in approximately 1 day(s).   The patient does have a current PCP (Kennis Carina, MD) and does not need an [facility] hospital follow-up appointment after discharge.  The patient does not have transportation limitations that hinder transportation to clinic appointments.  .Services Needed at time of discharge: Y = Yes, Blank = No PT:   OT:   RN:   Equipment:   Other:     LOS: 2 days   Windell Hummingbird, MD 11/22/2012, 8:10 AM

## 2012-11-23 LAB — GLUCOSE, CAPILLARY
Glucose-Capillary: 122 mg/dL — ABNORMAL HIGH (ref 70–99)
Glucose-Capillary: 113 mg/dL — ABNORMAL HIGH (ref 70–99)
Glucose-Capillary: 126 mg/dL — ABNORMAL HIGH (ref 70–99)

## 2012-11-23 NOTE — Progress Notes (Signed)
Subjective: [name] reports improvement of her LUQ abd pain and nausea and was tolerating a full liquid diet well yesterday and this AM. We transitioned her to a bland diet today at lunch, but she had 10/10 abd pain with this, so we will transition back to fulls for dinner with plan to advance to bland tomorrow. Patient thought she would be ready for discharge this morning when I saw her, but after the episode at lunch, she no longer feels ready to go home.  Objective: Vital signs in last 24 hours: Filed Vitals:   11/22/12 1325 11/22/12 2115 11/23/12 0546 11/23/12 1343  BP: 140/65 119/67 141/68 140/57  Pulse: 94 78 69 92  Temp: 99.3 F (37.4 C) 98.7 F (37.1 C) 98.3 F (36.8 C) 99.1 F (37.3 C)  TempSrc: Oral Oral Oral Oral  Resp: 17 18 18 19   Height:      Weight:      SpO2: 96% 99% 100% 97%   Physical Exam General: alert, cooperative, appears comfortable HEENT: vision grossly intact, oropharynx clear and non-erythematous, MMM Neck: supple Lungs: clear to ascultation bilaterally, normal work of respiration Heart: regular rate and rhythm, no murmurs, gallops, or rubs Abdomen: soft, ttp in LUQ and L flank, non-distended, normal bowel sounds; no CVA tenderness Extremities: warm extremities, no cyanosis, clubbing, or edema Neurologic: alert & oriented X3, strength and sensation grossly intact  Lab Results: Basic Metabolic Panel:  Recent Labs Lab 11/20/12 1614 11/21/12 0525  NA 136 141  K 4.6 4.1  CL 96 107  CO2 25 24  GLUCOSE 91 75  BUN 11 8  CREATININE 0.65 0.61  CALCIUM 10.7* 9.1  MG 1.5  --    Liver Function Tests:  Recent Labs Lab 11/20/12 1614 11/21/12 0525  AST 18 18  ALT 10 7  ALKPHOS 62 48  BILITOT 0.4 0.4  PROT 8.3 6.6  ALBUMIN 4.3 3.4*   Lactic acid- 1.1  Recent Labs Lab 11/20/12 1614  LIPASE 47   CBC:  Recent Labs Lab 11/20/12 1614 11/21/12 0525  WBC 8.5 5.6  NEUTROABS 3.8  --   HGB 13.9 11.3*  HCT 39.8 33.0*  MCV 75.4*  74.8*  PLT 335 329   CBG:  Recent Labs Lab 11/22/12 0719 11/22/12 1201 11/22/12 1735 11/22/12 2115 11/23/12 0726 11/23/12 1210  GLUCAP 115* 115* 120* 149* 117* 122*   Hemoglobin A1C:  Recent Labs Lab 11/20/12 1630  HGBA1C 6.6   Urine Drug Screen: Drugs of Abuse     Component Value Date/Time   LABOPIA POSITIVE* 11/20/2012 2030   LABOPIA NEG 08/30/2011 1526   COCAINSCRNUR NONE DETECTED 11/20/2012 2030   COCAINSCRNUR NEG 08/30/2011 1526   LABBENZ NONE DETECTED 11/20/2012 2030   LABBENZ NEG 08/30/2011 1526   LABBENZ NEG 01/24/2011 1414   AMPHETMU NONE DETECTED 11/20/2012 2030   AMPHETMU NEG 01/24/2011 1414   THCU NONE DETECTED 11/20/2012 2030   LABBARB NONE DETECTED 11/20/2012 2030   LABBARB NEG 08/30/2011 1526    Alcohol Level:  Recent Labs Lab 11/20/12 2013  ETH <11   Urinalysis:  Recent Labs Lab 11/20/12 1652  COLORURINE YELLOW  LABSPEC 1.008  PHURINE 5.0  GLUCOSEU NEG  HGBUR NEG  BILIRUBINUR NEG  KETONESUR NEG  PROTEINUR NEG  UROBILINOGEN 0.2  NITRITE NEG  LEUKOCYTESUR SMALL*   Medications: I have reviewed the patient's current medications. Scheduled Meds: . feeding supplement  1 Container Oral Q lunch  . heparin  5,000 Units Subcutaneous Q8H  . insulin  aspart  0-9 Units Subcutaneous TID WC  . lipase/protease/amylase  2 capsule Oral TID WC  . 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  1. Abdominal Pain:  Patient has a h/o recurrent acute pancreatitis and chronic LUQ abdominal pain of unclear origin for which she has been hospitalized many times in the past. Previous workup is as follows:  Patient  underwent EGD, which was normal, and EUS which showed pancreatic divisum, but no evidence suggestive of chronic pancreatitis (approx 10/2011).She has had 5 CT/MRI scans in the past which showed pancreatic divisum without findings of chronic pancreatitis. In addition, Dr. Gery Pray at [facility] comments that her lipase/amylase values were never over 3X the upper limit of normal. She saw Dr. Gery Pray on 12/2011 and he recommended starting bentyl at that time for possible IBS, patient reports that the bentyl did not help her symptoms so she stopped taking it. She saw Dr. Gery Pray again on 06/2012 at which time he recommended she start a fiber supplementation and levsin. However, it appears that she is not currently taking these medications.   The current episode of abd pain is not thought to be acute pancreatitis given her normal lipase and no other lab abnormalities. Patient had a normal emptying study on 03/2012, so gastroparesis is unlikely. Additionally, her diabetes appears to be much more well controlled now than in the past, HbA1c 6.6% during this admission. PUD unlikely given the patient's nl EGD last year, though things could have changed since this time. Cholecystitis unlikely given normal abdominal US in 10/2011 and that her pain is localized on the L side rather than R.  Pain is unlikely cardiac in origin given that it is nonexertional, localized to the LUQ, tender to palpation, is continuous and sharp. Diverticulitis, colon cancer, other colonic pathology should be considered, but is unlikely given last colonoscopy in our records was in 2013 and normal. Carcinoid syndrome and VIPoma have been considered. However, patient does not endorse having episodes of flushing that would be seen with carcinoid syndrome and she does not have hypokalemia consistent with VIPoma- additionally, previous imaging studies do not make mention of any masses. We considered mesenteric infaction, given her pain does seem to worsen with food intake,  but lactic acid was within normal limits. Cannot rule out mesenteric ischemia at this time, but her pain does not seem to be out of proportion to physical exam. Additional chart review shows patient was negative for endomysial IgA and tissue transglutaminase IgA in 2008, so her current pain and diarrhea are unlikely to be a result of celiac disease.   At this point the two items on my differential that I am leaning toward are somatization disorder/somatic symptom disorder (given her h/o multiple complaints without clear pathology) and IBS given her intermittent diarrhea, though patient does endorse that sometimes the diarrhea wakes her up at night and having bowel movements does not seem to improve her abd pain.We discussed starting patient on an SSRI, but given that her clinical picture is still relatively unclear and that she is closely followed by GI at [facility], we will hold off for now. I spoke with patient regarding goals of care for this admission and she would like her pain  to be improved and to tolerate her normal diet; she is looking forward to following up at [facility] with hopes of being referred to [facility] for further work up.  Current plan: -continue full liquid diet for dinner and advance to bland for breakfast with plan for discharge tomorrow  -oxycodone 10mg  PO q4prn  2. Nausea: Seems to worsen with PO intake. No vomiting.  -phenergan 12.5mg  q6 PRN   3. DM2: HA1C: 6.6 on 11/20/12  -hold metformin  -SSI  -cbg 4 times daily before meals and at bedtime   3. Hypertension: Chronic, stable. BP at goal. -lisinopril 20mg  qhs  -hydrochlorothiazide 12.5mg  qhs   4. Hyperlipidemia: TC: 260 / TRIG: 126 / HDL: 89 / LDL: 146 on 08/02/12  -begin pt on statin upon discharge  5. VTE prophylaxis:  -SCDs  -Heparin 5000 Units every 8 hrs  Dispo: Disposition is deferred at this time, awaiting improvement of current medical problems.  Anticipated discharge in approximately 1 day(s).   The patient does have a  current PCP (Kennis Carina, MD) and does not need an [facility] hospital follow-up appointment after discharge.  The patient does not have transportation limitations that hinder transportation to clinic appointments.  .Services Needed at time of discharge: Y = Yes, Blank = No PT:   OT:   RN:   Equipment:   Other:     LOS: 3 days   Windell Hummingbird, MD 11/23/2012, 1:46 PM

## 2012-11-23 NOTE — Progress Notes (Signed)
Internal Medicine Attending  Date: 11/23/2012  Patient name: [name] Medical record number: [id number] Date of birth: [birth year] Age: 57 y.o. Gender: female  I saw and evaluated the patient. I reviewed the resident's note by Dr. Darci Needle and I agree with the resident's findings and plans as documented in her progress note.  She failed advancement to a bland diet from clear liquids at lunch with a return in her abdominal pain.  She will be placed on a full liquid diet for dinner and if she does well will be advanced to a bland diet for breakfast.  The cause of her abdominal pain remains unknown although somatization disorder and irritable bowel syndrome remain at the top of the differential.

## 2012-11-23 NOTE — Discharge Summary (Signed)
Name: [name] MRN: [id number] DOB: 12/21/1955 57 y.o. PCP: Kennis Carina, MD  Date of Admission: 11/20/2012  6:02 PM Date of Discharge: 11/24/2012 Attending Physician: Rocco Serene, MD  Discharge Diagnosis: Principal Problem:   Chronic abdominal pain- unknown etiology thought to be 2/2 IBS vs somatization disorder vs. acute pancreatitis vs pain 2/2 pancreas divisum- followed by [facility] Active Problems:   Diabetes Mellitus type 2   Hyperlipidemia   Hypertension   GERD   Pancreas Divisum   Discharge Medications:   Medication List    STOP taking these medications       calcium carbonate 500 MG chewable tablet  Commonly known as:  TUMS - dosed in mg elemental calcium      TAKE these medications       lipase/protease/amylase 81191 UNITS Cpep  Commonly known as:  CREON-12/PANCREASE  Take 1-2 capsules by mouth 3 (three) times daily with meals. Takes 2 capsules with meals and 1 capsule with snacks     lisinopril-hydrochlorothiazide 20-12.5 MG per tablet  Commonly known as:  PRINZIDE,ZESTORETIC  Take 1 tablet by mouth every evening.     loperamide 2 MG tablet  Commonly known as:  IMODIUM A-D  Take 2 mg by mouth 4 (four) times daily as needed for diarrhea or loose stools.     metFORMIN 1000 MG tablet  Commonly known as:  GLUCOPHAGE  Take 1,000 mg by mouth 2 (two) times daily with a meal.     Oxycodone HCl 10 MG Tabs  Take 10 mg by mouth every 8 (eight) hours as needed (for pain).     polyethylene glycol packet  Commonly known as:  MIRALAX / GLYCOLAX  Take 17 g by mouth daily as needed (for constipation).     PRILOSEC 20 MG capsule  Generic drug:  omeprazole  Take 20 mg by mouth daily as needed (for acid reflux).     promethazine 12.5 MG tablet  Commonly known as:  PHENERGAN  Take 1 tablet (12.5 mg total) by mouth every 6 (six) hours as needed for nausea.        Disposition and follow-up:   [name] was discharged from [facility] in Stable condition.  At the hospital follow up visit please address:  1.  Chronic abdominal pain-  [facility] (Dr. Gery Pray) is planning to refer her to [facility] for further evaluation.   2. Hyperlipidemia- If pain well-controlled, consider starting statin  2.  Labs / imaging needed at time of follow-up: none  3.  Pending labs/ test needing follow-up: none  Follow-up Appointments:     Follow-up Information   Follow up with Pleas Koch, MD On 11/27/2012. (2:15pm)    Contact information:   [address] [place] [place] [address] [phone]       Discharge Instructions:  Future Appointments Provider Department Dept Phone   11/27/2012 2:15 PM Pleas Koch, MD [facility] [phone]     Admission HPI:  [name] is a 57yo AAF with a PMH of recurrent acute pancreatitis, pancreatic divisum s/p spincterotomy in 2011 and removal of stent in 2012, DM2, HTN, and dyslipidemia. She presents to the [facility] today with left upper quadrant abdominal pain and was sent to the hospital for evaluation.   Patient reports she began feeling pain in her left upper quadrant on Saturday. She subsequently felt nauseated and took some of her oxycodone and it provided little relief. She has  gradually felt worse over the past several days with decreased appetite and feels nauseated every time she tries to eat. She ate a potato last night and had an episode of nonbloody diarrhea and felt her stomach "blew". She describes the pain as sharp, 10/10, radiating to her back. She admits to adhering to a strict diet of mainly fruits and vegetables with little meat. She denies any alcohol use or any other recreational drug use. She has not had any vomiting, fever, chills, or night sweats. She denies any trauma, sick contacts, travel, or recent antibiotic use. She is on lisinopril and HCTZ for hypertension. She denies any urinary  symptoms and had a hysterectomy.   She is followed by Dr. Rochel Brome at [facility]. Her last appt was in February 2014 and according to the patient they may be placing another stent. In addition, an Abd [name] in June 2013 showed no acute abnormality and Gastric Emptying Study in November 2013 was normal. Her last CT Abd Pelvis Wo Contrast in November 2012 showed a small right groin and extraperitoneal hematoma on the right side, no retroperitoneal hematoma, and no other significant abdominal/pelvic findings. Patient also goes to a pain clinic and is on oxycodone 10mg .   Hospital Course by problem list:   1. Chronic LUQ abdominal pain of unknown origin- Patient has a h/o recurrent acute pancreatitis and chronic LUQ abdominal pain of unclear origin for which she has been hospitalized many times in the past. Patient's pain was managed on her home dose of oxycodone 10mg  and nausea maintained with phenergan. We kept her NPO overnight on day of admission during which time she was managed on NS 150cc/hr (discontinued when patient was tolerating clear liquid diet). We did not feel that this was an acute pancreatitis episode given lipase, CBC, and Bmet were wnl, so we advanced her diet to clears on day two of hospitalization, which patient tolerated. We did not perform imaging during this hospitalization because we did not feel that imaging would assist in diagnosis at this time given her previous extensive work up. Ultimately, the patient was advanced to a bland diet, and discharged home.  At time of discharge, the two leading items on the differential were somatization disorder/somatic symptom disorder (given her h/o multiple complaints without clear pathology) and IBS given her intermittent diarrhea, though patient does endorse that sometimes the diarrhea wakes her up at night and having bowel movements does not seem to improve her abd pain. We discussed starting patient on an SSRI, but given that her clinical  picture is still relatively unclear and that she is closely followed by GI at [facility], we did not do this during this admission. I spoke with patient regarding goals of care for this admission and she would like her pain to be improved and to tolerate her normal diet; she is looking forward to following up at [facility] with hopes of being referred to [facility] for further work up.  Patient's past work up summary is as follows:  Patient underwent EGD, which was normal, and EUS which showed pancreatic divisum, but no evidence suggestive of chronic pancreatitis (approx 10/2011).She has had 5 CT/MRI scans in the past which showed pancreatic divisum without findings of chronic pancreatitis. In addition, Dr. Gery Pray at [facility] comments that her lipase/amylase values were never over 3X the upper limit of normal. She saw Dr. Gery Pray on 12/2011 and he recommended starting bentyl at that time for possible IBS, patient reports that the bentyl did not help her  symptoms so she stopped taking it. She saw Dr. Gery Pray again on 06/2012 at which time he recommended she start a fiber supplementation and levsin. However, it appears that she is not currently taking these medications.   Patient had a normal emptying study on 03/2012, so gastroparesis is unlikely. Additionally, her diabetes appears to be much more well controlled now than in the past, HbA1c 6.6% during this admission. PUD unlikely given the patient's nl EGD last year, though things could have changed since this time. Cholecystitis unlikely given normal abdominal US in 10/2011 and that her pain is localized on the L side rather than R. Pain is unlikely cardiac in origin given that it is nonexertional, localized to the LUQ, tender to palpation, is continuous and sharp. Diverticulitis, colon cancer, other colonic pathology should be considered, but is unlikely given last colonoscopy in our records was in 2013 and normal. Carcinoid syndrome and VIPoma have been considered. However, patient does not  endorse having episodes of flushing that would be seen with carcinoid syndrome and she does not have hypokalemia consistent with VIPoma- additionally, previous imaging studies do not make mention of any masses. We considered mesenteric infaction, given her pain does seem to worsen with food intake, but lactic acid was within normal limits. Cannot rule out mesenteric ischemia at this time, but her pain does not seem to be out of proportion to physical exam. Additional chart review shows patient was negative for endomysial IgA and tissue transglutaminase IgA in 2008, so her current pain and diarrhea are unlikely to be a result of celiac disease.   2. HTN- Blood pressure well controlled during this admission. We continued her home doses of lisinopril and HCTZ.  3. DM type 2- Last HbA1c 6.6 on 11/20/2012. We held metformin this hospitalization and managed her blood glucose with sliding scale insulin. Metformin was restarted at time of discharge.  4. Hyperlipidemia - Per the patient's last lipid panel, the patient has an ASCVD risk score of 10.2%, qualifying her for statin therapy.  This was not started this admission due to the patient's active pain, given that it would be difficult to differentiate statin-induced myopathy from the patient's acute on chronic pain.  This can be considered in the outpatient setting when the patient is pain-free.  Discharge Vitals:   BP 140/57  Pulse 92  Temp(Src) 99.1 F (37.3 C) (Oral)  Resp 19  Ht 5\' 2"  (1.575 m)  Wt 106 lb 9.5 oz (48.35 kg)  BMI 19.49 kg/m2  SpO2 97%  Discharge Labs:  Results for orders placed during the hospital encounter of 11/20/12 (from the past 24 hour(s))  GLUCOSE, CAPILLARY     Status: Abnormal   Collection Time    11/22/12  5:35 PM      Result Value Range   Glucose-Capillary 120 (*) 70 - 99 mg/dL  GLUCOSE, CAPILLARY     Status: Abnormal   Collection Time    11/22/12  9:15 PM      Result Value Range   Glucose-Capillary 149 (*) 70 -  99 mg/dL  GLUCOSE, CAPILLARY     Status: Abnormal   Collection Time    11/23/12  7:26 AM      Result Value Range   Glucose-Capillary 117 (*) 70 - 99 mg/dL  GLUCOSE, CAPILLARY     Status: Abnormal   Collection Time    11/23/12 12:10 PM      Result Value Range   Glucose-Capillary 122 (*) 70 - 99 mg/dL  Comment 1 Notify RN      Signed: Windell Hummingbird, MD 11/23/2012, 1:54 PM   Time Spent on Discharge: 35 minutes Services Ordered on Discharge: none Equipment Ordered on Discharge: none

## 2012-11-24 LAB — GLUCOSE, CAPILLARY: Glucose-Capillary: 112 mg/dL — ABNORMAL HIGH (ref 70–99)

## 2012-11-24 MED ORDER — PROMETHAZINE HCL 12.5 MG PO TABS: 12.5000 mg | ORAL_TABLET | Freq: Four times a day (QID) | ORAL | Status: DC | PRN

## 2012-11-24 NOTE — Progress Notes (Signed)
Patient discharged home with husband.  Husband present for discharge teaching.  Patient was given instructions regarding her medication list, activities, diet, and when to call the doctor about possible complications.  No prescription given.  Patient verbalized understanding of teaching and will follow up with Dr. Glendell Docker.

## 2012-11-24 NOTE — Progress Notes (Signed)
Subjective: The patient notes improvement in her abdominal pain.  She tolerated a bland diet with breakfast.  She still notes some mild abdominal pain, but is able to take PO food and medications.  Objective: Vital signs in last 24 hours: Filed Vitals:   11/23/12 0546 11/23/12 1343 11/23/12 2200 11/24/12 0557  BP: 141/68 140/57 127/66 116/59  Pulse: 69 92 64 106  Temp: 98.3 F (36.8 C) 99.1 F (37.3 C) 98.4 F (36.9 C) 98.8 F (37.1 C)  TempSrc: Oral Oral Oral Oral  Resp: 18 19 18 18   Height:      Weight:      SpO2: 100% 97% 98% 98%   Physical Exam General: alert, cooperative, appears comfortable HEENT: vision grossly intact, oropharynx clear and non-erythematous, MMM Neck: supple Lungs: clear to ascultation bilaterally, normal work of respiration Heart: regular rate and rhythm, no murmurs, gallops, or rubs Abdomen: soft, ttp in LUQ and L flank, non-distended, normal bowel sounds; no CVA tenderness Extremities: warm extremities, no cyanosis, clubbing, or edema Neurologic: alert & oriented X3, strength and sensation grossly intact  Lab Results: Basic Metabolic Panel:  Recent Labs Lab 11/20/12 1614 11/21/12 0525  NA 136 141  K 4.6 4.1  CL 96 107  CO2 25 24  GLUCOSE 91 75  BUN 11 8  CREATININE 0.65 0.61  CALCIUM 10.7* 9.1  MG 1.5  --    Liver Function Tests:  Recent Labs Lab 11/20/12 1614 11/21/12 0525  AST 18 18  ALT 10 7  ALKPHOS 62 48  BILITOT 0.4 0.4  PROT 8.3 6.6  ALBUMIN 4.3 3.4*   Lactic acid- 1.1  Recent Labs Lab 11/20/12 1614  LIPASE 47   CBC:  Recent Labs Lab 11/20/12 1614 11/21/12 0525  WBC 8.5 5.6  NEUTROABS 3.8  --   HGB 13.9 11.3*  HCT 39.8 33.0*  MCV 75.4* 74.8*  PLT 335 329   CBG:  Recent Labs Lab 11/22/12 2115 11/23/12 0726 11/23/12 1210 11/23/12 1720 11/23/12 2159 11/24/12 0749  GLUCAP 149* 117* 122* 113* 126* 112*   Hemoglobin A1C:  Recent Labs Lab 11/20/12 1630  HGBA1C 6.6   Urine Drug  Screen: Drugs of Abuse     Component Value Date/Time   LABOPIA POSITIVE* 11/20/2012 2030   LABOPIA NEG 08/30/2011 1526   COCAINSCRNUR NONE DETECTED 11/20/2012 2030   COCAINSCRNUR NEG 08/30/2011 1526   LABBENZ NONE DETECTED 11/20/2012 2030   LABBENZ NEG 08/30/2011 1526   LABBENZ NEG 01/24/2011 1414   AMPHETMU NONE DETECTED 11/20/2012 2030   AMPHETMU NEG 01/24/2011 1414   THCU NONE DETECTED 11/20/2012 2030   LABBARB NONE DETECTED 11/20/2012 2030   LABBARB NEG 08/30/2011 1526    Alcohol Level:  Recent Labs Lab 11/20/12 2013  ETH <11   Urinalysis:  Recent Labs Lab 11/20/12 1652  COLORURINE YELLOW  LABSPEC 1.008  PHURINE 5.0  GLUCOSEU NEG  HGBUR NEG  BILIRUBINUR NEG  KETONESUR NEG  PROTEINUR NEG  UROBILINOGEN 0.2  NITRITE NEG  LEUKOCYTESUR SMALL*   Medications: I have reviewed the patient's current medications. Scheduled Meds: . feeding supplement  1 Container Oral Q lunch  . heparin  5,000 Units Subcutaneous Q8H  . insulin aspart  0-9 Units Subcutaneous TID WC  . lipase/protease/amylase  2 capsule Oral TID WC  . lisinopril  20 mg Oral QHS     PRN Meds:.lipase/protease/amylase, oxyCODONE, promethazine  Assessment/Plan: [name] is a 57yo AAF with a PMH of pancreatic divisum, recurrent acute pancreatitis s/p minor papillotomy  and previous stent placement/removal, chronic abdominal pain, DM2, HTN, and dyslipidemia who presents with LUQ pain, nausea and diarrhea- identical to previous episodes.  1. Abdominal Pain:  Please see Dr. Hollace Kinnier note from 7/25 detailing our full thought process behind the patient's abdominal pain.  Briefly, IBS and somatization disorder are on the top of our differential.  Symptoms have improved, and patient is tolerating PO well. -continue oxycodone 10mg  PO q4prn -conitnue phenergan  2. DM2: HA1C: 6.6 on 11/20/12  -hold metformin  -SSI  -cbg 4 times daily before meals and at bedtime   3. Hypertension: Chronic, stable. BP at  goal. -lisinopril 20mg  qhs  -hydrochlorothiazide 12.5mg  qhs   4. Hyperlipidemia: TC: 260 / TRIG: 126 / HDL: 89 / LDL: 146 on 08/02/12  -begin pt on statin upon discharge  5. VTE prophylaxis:  -SCDs  -Heparin 5000 Units every 8 hrs  Dispo: Discharge today   The patient does have a current PCP (Eileen Carina, MD) and does not need an [facility] hospital follow-up appointment after discharge.  The patient does not have transportation limitations that hinder transportation to clinic appointments.  .Services Needed at time of discharge: Y = Yes, Blank = No PT:   OT:   RN:   Equipment:   Other:     LOS: 4 days   Linward Headland, MD 11/24/2012, 11:53 AM

## 2012-11-27 ENCOUNTER — Ambulatory Visit (INDEPENDENT_AMBULATORY_CARE_PROVIDER_SITE_OTHER): Payer: BC Managed Care – PPO | Admitting: Internal Medicine

## 2012-11-27 ENCOUNTER — Encounter: Payer: Self-pay | Admitting: Internal Medicine

## 2012-11-27 VITALS — BP 140/85 | HR 77 | Temp 98.1°F | Ht 60.0 in | Wt 108.1 lb

## 2012-11-27 DIAGNOSIS — E785 Hyperlipidemia, unspecified: Secondary | ICD-10-CM

## 2012-11-27 DIAGNOSIS — R109 Unspecified abdominal pain: Secondary | ICD-10-CM

## 2012-11-27 DIAGNOSIS — E119 Type 2 diabetes mellitus without complications: Secondary | ICD-10-CM

## 2012-11-27 DIAGNOSIS — I10 Essential (primary) hypertension: Secondary | ICD-10-CM

## 2012-11-27 DIAGNOSIS — G8929 Other chronic pain: Secondary | ICD-10-CM

## 2012-11-27 NOTE — Assessment & Plan Note (Signed)
A:   Using a CV risk calculator, the patient has a 10 year risk of 14.4% of a major cardviovascular event. The reduction of LDL to less than 100 would decrease this risk by about 3%.  P:  Today, I recommended that the patient start a lipid lowering medication. The patient is very against medicines, and elected to try diet modification until he next visit. She would like to continue the discussion aft rechecking her cholesterol then.

## 2012-11-27 NOTE — Assessment & Plan Note (Signed)
Lab Results  Component Value Date   HGBA1C 6.6 11/20/2012   HGBA1C >14.0 08/02/2012   HGBA1C 8.4* 02/27/2012     Assessment: Diabetes control: good control (HgbA1C at goal) Progress toward A1C goal:  at goal Comments: Well controlled on diet and metformin  Plan: Medications:  continue current medications Home glucose monitoring: Frequency: no home glucose monitoring Timing:   Instruction/counseling given: reminded to bring medications to each visit and discussed diet Other plans: Continue current management.

## 2012-11-27 NOTE — Progress Notes (Signed)
Case discussed with Dr. Komanski (at time of visit, soon after the resident saw the patient).  We reviewed the resident's history and exam and pertinent patient test results.  I agree with the assessment, diagnosis, and plan of care documented in the resident's note.  

## 2012-11-27 NOTE — Patient Instructions (Addendum)
Continue current medications for blood pressure and DM II  Start a low cholesterol diet, and plan to recheck cholesterol at next visit  F/U with PCP in 2-3 months.

## 2012-11-27 NOTE — Assessment & Plan Note (Signed)
A:  The patients chronic abdominal pain is well controlled on current pain medicines that are managed by her Pain Clinic.  P:  Continue current management.

## 2012-11-27 NOTE — Progress Notes (Signed)
Subjective:   Patient ID: [name] female   DOB: April 05, 1956 57 y.o.   MRN: [id number]  HPI: [name] is a 57 y.o. man with a pmhx of chronic abdominal pain, HTN, and HLD who comes to the clinic for hospital f/u. The patient was admitted for acute on chronic abdominal pain. The patient had LUQ 10/10 pain made worse with eating. She is followed by Dr. Rochel Brome at [facility] for management of pancreatic divisum. The inpatient team clearly detailed the extensive workup that the patient had received previously, and concluded that her symptoms may be possible to somatization. Since discharge the patient has done well on her previous pain control (Oxycodone 10 mg tid). She is tolerating food well. She started with liquid only diet and has now progressed to eating a baked potato. The patient has no acute complaints today.  The patient has mild nausea, no vomiting. Denies fevers, chills, constipation, and diarrhea.  Past Medical History  Diagnosis Date  . Hypertension   . HLD (hyperlipidemia) 2009  . Sickle cell trait   . Pulmonary embolus 08/2004    Two areas of V/Q mismatch. Findings compatible  with high probability for pulmonary embolus.; pt was on coumadin for 1 year  . Pancreatic divisum     S/P ERCP with stenting 07/2010 at [facility], then stent removal (08/05/2010)  . CVA (cerebral infarction) 1993    Per report by patient she had a stroke and prolonged rehab course eventually resolving her right sided weakness, as per her hx obtained 08/2007  . Blood dyscrasia     thalasemia, sickle cell trait  . Chronic abdominal pain     Unclear etiology, thought to be due to chronic pancreatitis previously in setting of her pancreatic divisum - however, EUS and EGD performed at [facility]  (10/05/2011) showing normal esophageall, gastric, duodenal mucosa. EUS showing no pancreatic masses, cysts, or changes of chronic pancreatitis. Biliary system nondilated and had no  endosonographic abnormalities.  . Type II diabetes mellitus 2010    well controlled  . Asthma     childhood asthma  . Uterine cancer   . Pneumonia ~ 1969  . Beta thalassemia   . Thalassemia   . Migraine headache     "q other year now" (11/20/2012)  . Seizures     "after the stroke in 1993; none for years now" 11/20/2012)  . Stroke 1993    very small amount of left sided weakness   Current Outpatient Prescriptions  Medication Sig Dispense Refill  . lipase/protease/amylase (CREON-12/PANCREASE) 12000 UNITS CPEP Take 1-2 capsules by mouth 3 (three) times daily with meals. Takes 2 capsules with meals and 1 capsule with snacks      . lisinopril-hydrochlorothiazide (PRINZIDE,ZESTORETIC) 20-12.5 MG per tablet Take 1 tablet by mouth every evening.      . metFORMIN (GLUCOPHAGE) 1000 MG tablet Take 1,000 mg by mouth 2 (two) times daily with a meal.      . Oxycodone HCl 10 MG TABS Take 10 mg by mouth every 8 (eight) hours as needed (for pain).       . promethazine (PHENERGAN) 12.5 MG tablet Take 1 tablet (12.5 mg total) by mouth every 6 (six) hours as needed for nausea.  60 tablet  0  . loperamide (IMODIUM A-D) 2 MG tablet Take 2 mg by mouth 4 (four) times daily as needed for diarrhea or loose stools.      . polyethylene glycol (MIRALAX / GLYCOLAX) packet Take 17 g by  mouth daily as needed (for constipation).       No current facility-administered medications for this visit.   Family History  Problem Relation Age of Onset  . Prostate cancer Father   . Cancer Father     stomach ca died x 1 year ago  . Sickle cell anemia Brother   . Lung cancer Maternal Aunt   . Lung cancer Maternal Uncle   . Breast cancer Maternal Grandmother   . Heart disease Maternal Grandfather   . Heart disease Paternal Grandfather   . Diabetes Other   . Cancer Other     multiple cancers pancreas,colon, breast   History   Social History  . Marital Status: Married    Spouse Name: N/A    Number of Children: N/A  .  Years of Education: N/A   Social History Main Topics  . Smoking status: Former Smoker -- 0.50 packs/day for 30 years    Types: Cigarettes    Quit date: 01/01/1999  . Smokeless tobacco: Never Used  . Alcohol Use: No  . Drug Use: No  . Sexually Active: Yes   Other Topics Concern  . None   Social History Narrative   Works at [employer] as a Merchandiser, retail, on Health visitor about 9 hours/ day   Review of Systems:  Pertinent items are noted in HPI.  Objective:  Physical Exam: Filed Vitals:   11/27/12 1417  BP: 140/85  Pulse: 77  Temp: 98.1 F (36.7 C)  TempSrc: Oral  Height: 5' (1.524 m)  Weight: 108 lb 1.6 oz (49.034 kg)  SpO2: 99%   Physical Exam  Constitutional: She is oriented to person, place, and time. She appears well-developed and well-nourished. No distress.  HENT:  Head: Normocephalic and atraumatic.  Mouth/Throat: Oropharynx is clear and moist. No oropharyngeal exudate.  Eyes: Conjunctivae and EOM are normal. Pupils are equal, round, and reactive to light.  Cardiovascular: Normal rate, regular rhythm and normal heart sounds.  Exam reveals no gallop and no friction rub.   No murmur heard. Pulmonary/Chest: Effort normal and breath sounds normal. No respiratory distress. She has no wheezes. She has no rales.  Abdominal: Soft. Bowel sounds are normal. She exhibits no distension. There is tenderness.  Moderately tender in the LUQ, No rebound tenderness. Nl bowel sounds in all quadrants.  Neurological: She is alert and oriented to person, place, and time.  Skin: She is not diaphoretic.  Psychiatric: She has a normal mood and affect. Her behavior is normal.     Assessment & Plan:

## 2012-11-27 NOTE — Assessment & Plan Note (Signed)
BP Readings from Last 3 Encounters:  11/27/12 140/85  11/24/12 116/59  11/20/12 155/86    Lab Results  Component Value Date   NA 141 11/21/2012   K 4.1 11/21/2012   CREATININE 0.61 11/21/2012    Assessment: Blood pressure control: controlled Progress toward BP goal:  at goal Comments: Well controlled on current medicine.   Plan: Medications:  continue current medications Other plans: Follow up at next visit in 2-3 months.

## 2013-01-14 ENCOUNTER — Telehealth: Payer: Self-pay | Admitting: *Deleted

## 2013-01-14 NOTE — Telephone Encounter (Signed)
Pt called - problems with right knee -" locked up"  01/11/13 PM. Did not want to go to ER - problems with transportation. Wants to wait latter in week - appt made 01/16/13 2PM Dr Clyde Lundborg. Will call if any change. Shanikqua Zarzycki RN 9/15.14 9AM

## 2013-01-16 ENCOUNTER — Ambulatory Visit (INDEPENDENT_AMBULATORY_CARE_PROVIDER_SITE_OTHER): Payer: BC Managed Care – PPO | Admitting: Internal Medicine

## 2013-01-16 ENCOUNTER — Encounter: Payer: Self-pay | Admitting: Internal Medicine

## 2013-01-16 VITALS — BP 141/74 | HR 70 | Temp 98.0°F | Ht 60.75 in | Wt 106.8 lb

## 2013-01-16 DIAGNOSIS — K861 Other chronic pancreatitis: Secondary | ICD-10-CM

## 2013-01-16 DIAGNOSIS — M25569 Pain in unspecified knee: Secondary | ICD-10-CM

## 2013-01-16 DIAGNOSIS — Z0000 Encounter for general adult medical examination without abnormal findings: Secondary | ICD-10-CM

## 2013-01-16 DIAGNOSIS — Z23 Encounter for immunization: Secondary | ICD-10-CM

## 2013-01-16 DIAGNOSIS — Z299 Encounter for prophylactic measures, unspecified: Secondary | ICD-10-CM

## 2013-01-16 DIAGNOSIS — M25561 Pain in right knee: Secondary | ICD-10-CM | POA: Insufficient documentation

## 2013-01-16 DIAGNOSIS — E119 Type 2 diabetes mellitus without complications: Secondary | ICD-10-CM

## 2013-01-16 LAB — GLUCOSE, CAPILLARY: Glucose-Capillary: 66 mg/dL — ABNORMAL LOW (ref 70–99)

## 2013-01-16 MED ORDER — PROMETHAZINE HCL 12.5 MG PO TABS: 12.5000 mg | ORAL_TABLET | Freq: Four times a day (QID) | ORAL | Status: DC | PRN

## 2013-01-16 NOTE — Progress Notes (Signed)
Patient ID: Michaeline Eckersley, female   DOB: [birth year], 57 y.o.   MRN: [id number] Subjective:   Patient ID: Genelda Digioia female   DOB: 1955-08-05 57 y.o.   MRN: [id number]  CC: Acute visit due to right knee pain.  HPI:  Ms.Seferina Nieblas is a 57 y.o. layd with past medical history as outlined below, who presents for an acute visit.  At 4 days ago, when patient tried to stand up from a seated position, she fell severe pain over her right knee joint. She noticed that the bone over her right knee joint is dislocated to the lateral side. She pushed the knee bone back to the normal position. Since then, she has persistent pain over the right knee joint. He initially had some mild swelling over the knee joint, which resolved spontaneously. Currently no swelling or warmth. The pain is aggravated by movement to all directions.The pain is 6/10 in severity, radiating to the lower leg and upper thigh anteriorly. She does not have fever or chills. She has oxycodone at home for her chronic pancreatitis, which relieves the pain.   ROS:  Denies fever, chills, fatigue, headaches,  cough, chest pain, SOB, diarrhea, constipation, dysuria, urgency, frequency, hematuria.     Past Medical History  Diagnosis Date  . Hypertension   . HLD (hyperlipidemia) 2009  . Sickle cell trait   . Pulmonary embolus 08/2004    Two areas of V/Q mismatch. Findings compatible  with high probability for pulmonary embolus.; pt was on coumadin for 1 year  . Pancreatic divisum     S/P ERCP with stenting 07/2010 at [facility], then stent removal (08/05/2010)  . CVA (cerebral infarction) 1993    Per report by patient she had a stroke and prolonged rehab course eventually resolving her right sided weakness, as per her hx obtained 08/2007  . Blood dyscrasia     thalasemia, sickle cell trait  . Chronic abdominal pain     Unclear etiology, thought to be due to chronic pancreatitis previously in setting of her pancreatic  divisum - however, EUS and EGD performed at [facility]  (10/05/2011) showing normal esophageall, gastric, duodenal mucosa. EUS showing no pancreatic masses, cysts, or changes of chronic pancreatitis. Biliary system nondilated and had no endosonographic abnormalities.  . Type II diabetes mellitus 2010    well controlled  . Asthma     childhood asthma  . Uterine cancer   . Pneumonia ~ 1969  . Beta thalassemia   . Thalassemia   . Migraine headache     "q other year now" (11/20/2012)  . Seizures     "after the stroke in 1993; none for years now" 11/20/2012)  . Stroke 1993    very small amount of left sided weakness   Current Outpatient Prescriptions  Medication Sig Dispense Refill  . lipase/protease/amylase (CREON-12/PANCREASE) 12000 UNITS CPEP Take 1-2 capsules by mouth 3 (three) times daily with meals. Takes 2 capsules with meals and 1 capsule with snacks      . lisinopril-hydrochlorothiazide (PRINZIDE,ZESTORETIC) 20-12.5 MG per tablet Take 1 tablet by mouth every evening.      . loperamide (IMODIUM A-D) 2 MG tablet Take 2 mg by mouth 4 (four) times daily as needed for diarrhea or loose stools.      . metFORMIN (GLUCOPHAGE) 1000 MG tablet Take 1,000 mg by mouth 2 (two) times daily with a meal.      . Oxycodone HCl 10 MG TABS Take 10 mg by mouth every 8 (eight) hours as  needed (for pain).       . polyethylene glycol (MIRALAX / GLYCOLAX) packet Take 17 g by mouth daily as needed (for constipation).      . promethazine (PHENERGAN) 12.5 MG tablet Take 1 tablet (12.5 mg total) by mouth every 6 (six) hours as needed for nausea.  60 tablet  0   No current facility-administered medications for this visit.   Family History  Problem Relation Age of Onset  . Prostate cancer Father   . Cancer Father     stomach ca died x 1 year ago  . Sickle cell anemia Brother   . Lung cancer Maternal Aunt   . Lung cancer Maternal Uncle   . Breast cancer Maternal Grandmother   . Heart disease Maternal  Grandfather   . Heart disease Paternal Grandfather   . Diabetes Other   . Cancer Other     multiple cancers pancreas,colon, breast   History   Social History  . Marital Status: Married    Spouse Name: N/A    Number of Children: N/A  . Years of Education: N/A   Social History Main Topics  . Smoking status: Former Smoker -- 0.50 packs/day for 30 years    Types: Cigarettes    Quit date: 01/01/1999  . Smokeless tobacco: Never Used  . Alcohol Use: No  . Drug Use: No  . Sexual Activity: Yes   Other Topics Concern  . None   Social History Narrative   Works at [employer] as a Merchandiser, retail, on Health visitor about 9 hours/ day    Review of Systems: Full 14-point review of systems otherwise negative. See HPI.   Objective:  Physical Exam: Filed Vitals:   01/16/13 1407  BP: 141/74  Pulse: 70  Temp: 98 F (36.7 C)  TempSrc: Oral  Height: 5' 0.75" (1.543 m)  Weight: 106 lb 12.8 oz (48.444 kg)  SpO2: 100%   Constitutional: She is oriented to person, place, and time. She appears well-developed and well-nourished. No distress.  HENT:   Head: Normocephalic and atraumatic.   Mouth/Throat: Oropharynx is clear and moist. No oropharyngeal exudate.  Eyes: Conjunctivae and EOM are normal. Pupils are equal, round, and reactive to light.  Cardiovascular: Normal rate, regular rhythm and normal heart sounds.  Exam reveals no gallop and no friction rub.    No murmur heard. Pulmonary/Chest: Effort normal and breath sounds normal. No respiratory distress. She has no wheezes. She has no rales.  Abdominal: Soft. Bowel sounds are normal. She exhibits no distension. There is mild tenderness diffusely which is chronic due to pancreatitis.  Extremities: There is tenderness over the right knee joint. There is limitations of ROM to all directions due to pain. There is no swelling or warmth. There is no deformity. Patellar bone is at neutral position. Neurological: She is alert and oriented to person,  place, and time.  Skin: She is not diaphoretic.  Psychiatric: She has a normal mood and affect. Her behavior is normal.    Assessment & Plan:

## 2013-01-16 NOTE — Assessment & Plan Note (Signed)
-  gave flu shot -Retina photon was taken today.

## 2013-01-16 NOTE — Patient Instructions (Addendum)
1. It seems that your patella bone was dislocated. Please be very careful when you changes your position. Please follow up the appointment with sports medicine. 2. Please take all medications as prescribed.  3. If you have worsening of your symptoms or new symptoms arise, please call the clinic ([phone]), or go to the ER immediately if symptoms are severe.   Knee Bracing Knee braces are supports to help stabilize and protect an injured or painful knee. They come in many different styles. They should support and protect the knee without increasing the chance of other injuries to yourself or others. It is important not to have a false sense of security when using a brace. Knee braces that help you to keep using your knee:  Do not restore normal knee stability under high stress forces.  May decrease some aspects of athletic performance. Some of the different types of knee braces are:  Prophylactic knee braces are designed to prevent or reduce the severity of knee injuries during sports that make injury to the knee more likely.  Rehabilitative knee braces are designed to allow protected motion of:  Injured knees.  Knees that have been treated with or without surgery. There is no evidence that the use of a supportive knee brace protects the graft following a successful anterior cruciate ligament (ACL) reconstruction. However, braces are sometimes used to:   Protect injured ligaments.  Control knee movement during the initial healing period. They may be used as part of the treatment program for the various injured ligaments or cartilage of the knee including the:  Anterior cruciate ligament.  Medial collateral ligament.  Medial or lateral cartilage (meniscus).  Posterior cruciate ligament.  Lateral collateral ligament. Rehabilitative knee braces are most commonly used:  During crutch-assisted walking right after injury.  During crutch-assisted walking right after surgery to repair  the cartilage and/or cruciate ligament injury.  For a short period of time, 2-8 weeks, after the injury or surgery. The value of a rehabilitative brace as opposed to a cast or splint includes the:  Ability to adjust the brace for swelling.  Ability to remove the brace for examinations, icing or showering.  Ability to allow for movement in a controlled range of motion. Functional knee braces give support to knees that have already been injured. They are designed to provide stability for the injured knee and provide protection after repair. Functional knee braces may not affect performance much. Lower extremity muscle strengthening, flexibility, and improvement in technique are more important than bracing in treating ligamentous knee injuries. Functional braces are not a substitute for rehabilitation or surgical procedures. Unloader/offloader braces are designed to provide pain relief in arthritic knees. Patients with wear and tear arthritis from growing old or from an old cartilage injury (osteoarthritis) of the knee, and bow legged (varus) or knock knee (valgus) deformities, often develop increased pain in the arthritic side due to increased loading. Unloader/offloader braces are made to reduce uneven loading in such knees. There is reduction in bowing out movement in bow legged knees when the correct unloader brace is used. Patients with advanced osteoarthritis or severe varus or valgus alignment problems would not likely benefit from bracing. Patellofemoral braces help the kneecap to move smoothly and well centered over the end of the femur in the knee.  Most people who wear knee braces feel that they help. However, there is a lack of scientific evidence that knee braces are helpful at the level needed for athletic participation to prevent injury. In spite  of this, athletes report an increase in knee stability, pain relief, performance improvement, and confidence during athletics when using a brace.   Different knee problems require different knee braces:  Your caregiver may suggest one kind of knee brace after knee surgery.  A caregiver may choose another kind of knee brace for support instead of surgery for some types of torn ligaments.  You may also need one for pain in the front of your knee that is not getting better with strengthening and flexibility exercises. Get your caregiver's advice if you want to try a knee brace. The caregiver will advise you on where to get them and provide a prescription when it is needed to fashion and/or fit the brace. Knee braces are the least important part of preventing knee injuries or getting better following injury. Stretching, strengthening and technique improvement are far more important in caring for and preventing knee injuries. When strengthening your knee, increase your activities a little at a time so as not to develop injuries from over use. Work out an exercise plan with your caregiver and/or physical therapist to get the best program for you. Do not let a knee brace become a crutch. Always remember, there are no braces which support the knee as well as your original ligaments and cartilage you were born with. Conditioning, proper warm-up and stretching remain the most important parts of keeping your knees healthy. HOW TO USE A KNEE BRACE  During sports, knee braces should be used as directed by your caregiver.  Make sure that the hinges are where the knee bends.  Straps, tapes, or hook-and-loop tapes should be fastened around your leg as instructed.  You should check the placement of the brace during activities to make sure that it has not moved. Poorly positioned braces can hurt rather than help you.  To work well, a knee brace should be worn during all activities that put you at risk of knee injury.  Warm up properly before beginning athletic activities. HOME CARE INSTRUCTIONS  Knee braces often get damaged during normal use. Replace  worn-out braces for maximum benefit.  Clean regularly with soap and water.  Inspect your brace often for wear and tear.  Cover exposed metal to protect others from injury.  Durable materials may cost more, but last longer. SEEK IMMEDIATE MEDICAL CARE IF:   Your knee seems to be getting worse rather than better.  You have increasing pain or swelling in the knee.  You have problems caused by the knee brace.  You have increased swelling or inflammation (redness or soreness) in your knee.  Your knee becomes warm and more painful and you develop an unexplained temperature over 101 F (38.3 C). MAKE SURE YOU:   Understand these instructions.  Will watch your condition.  Will get help right away if you are not doing well or get worse. See your caregiver, physical therapist or orthopedic surgeon for additional information. Document Released: 07/09/2003 Document Revised: 07/11/2011 Document Reviewed: 10/15/2008 [facility] Patient Information 2014 [place], [place].

## 2013-01-16 NOTE — Assessment & Plan Note (Signed)
Per patient's discrption, it seems that patient had dislocated patella bone, which has resolved. Currently there is no swelling or warmth, indicating unlikely to have infection. There is no deformity. Patellar bone is at neutral position.  -will advice patient to wear knee brace -will give her a referral to sports medicine. -She has pain medication at home.

## 2013-01-17 NOTE — Progress Notes (Signed)
INTERNAL MEDICINE TEACHING ATTENDING ADDENDUM - Jonah Blue, DO, FACP: I reviewed with the resident Dr. Jerolyn Shin Falotico'  medical history, physical examination, diagnosis and results of tests and treatment and I agree with the patient's care as documented.

## 2013-01-28 ENCOUNTER — Encounter: Payer: Self-pay | Admitting: Family Medicine

## 2013-01-28 ENCOUNTER — Ambulatory Visit (INDEPENDENT_AMBULATORY_CARE_PROVIDER_SITE_OTHER): Payer: BC Managed Care – PPO | Admitting: Family Medicine

## 2013-01-28 VITALS — BP 144/84 | HR 83 | Ht 60.75 in | Wt 106.0 lb

## 2013-01-28 DIAGNOSIS — S83003A Unspecified subluxation of unspecified patella, initial encounter: Secondary | ICD-10-CM | POA: Insufficient documentation

## 2013-01-28 DIAGNOSIS — S83001A Unspecified subluxation of right patella, initial encounter: Secondary | ICD-10-CM

## 2013-01-28 DIAGNOSIS — S83006A Unspecified dislocation of unspecified patella, initial encounter: Secondary | ICD-10-CM

## 2013-01-28 NOTE — Progress Notes (Signed)
  Subjective:    Patient ID: [name], female    DOB: Jul 27, 1955, 57 y.o.   MRN: [id number]  HPI Right knee pain that started acutely 2 weeks ago. She was getting up from the couch when she had a sudden pain in her right knee. She was unable to straighten it. She looked down and the knee cap looked like it was in the wrong place. Her husband carried her to bed where she was able to push the kneecap back into place. She had a lot of swelling the next day. She has been wearing an open patella knee brace since then during the day which really seems to help. At night, she's having some pain because when she rolls over it feels like the knee for pop out again. She's not been wearing the knee brace at night. The swelling has improved.  PERTINENT  PMH / PSH: Beta thalassemia Sickle cell trait Seizure disorder Diabetes mellitus No prior history of knee injury, no knee surgeries. Review of Systems No fever, sweats, chills.    Objective:   Physical Exam  Vital signs are reviewed THEN female no acute distress in KNEE: Right. Full range of motion in flexion extension. Bilaterally the patella is quite mobile. She has positive apprehension sign on the right. Ligaments intact to varus and valgus stress. ; Quadricep patellar tendon are intact. Her some mild increased hyperactivity seen on the Doppler in the lateral portion of the capsule but identify no tear. The menisci on the medial side as well seen and normal. The meniscus on the lateral side has a small amount of fluid around it and is slightly extruded from the joint space but still maintains its shape.      Assessment & Plan:  Likely patellar subluxation. I would continue the brace. I would where it 20-22 hours a day including at night. Her recommended taking at least 2 breaks of one hour a day for the next 3 weeks. I gave her some strengthening exercises to do, short arc quadriceps exercises. She has poor quadricep strength bilaterally.  We discussed what caused her to have this happen, that is mobile patella. She can followup when necessary. Greater than 50% of our 45 minute office visit was spent in counseling and education regarding these issues.

## 2013-02-05 NOTE — Addendum Note (Signed)
Addended by: Remus Blake on: 02/05/2013 11:03 AM   Modules accepted: Orders

## 2013-02-25 ENCOUNTER — Other Ambulatory Visit: Payer: Self-pay | Admitting: *Deleted

## 2013-02-26 MED ORDER — METFORMIN HCL 1000 MG PO TABS: 1000.0000 mg | ORAL_TABLET | Freq: Two times a day (BID) | ORAL | Status: DC

## 2013-02-26 NOTE — Telephone Encounter (Signed)
Message sent to front office to schedule pt an appt. 

## 2013-03-08 ENCOUNTER — Other Ambulatory Visit: Payer: Self-pay | Admitting: *Deleted

## 2013-03-08 DIAGNOSIS — K861 Other chronic pancreatitis: Secondary | ICD-10-CM

## 2013-03-10 MED ORDER — PROMETHAZINE HCL 12.5 MG PO TABS: 12.5000 mg | ORAL_TABLET | Freq: Four times a day (QID) | ORAL | Status: DC | PRN

## 2013-03-11 NOTE — Telephone Encounter (Signed)
Rx called in to pharmacy. Stanton Kidney Cody Albus RN 03/11/13 9:05AM

## 2013-06-21 ENCOUNTER — Other Ambulatory Visit: Payer: Self-pay | Admitting: Internal Medicine

## 2013-07-05 ENCOUNTER — Other Ambulatory Visit: Payer: Self-pay | Admitting: *Deleted

## 2013-07-05 DIAGNOSIS — K861 Other chronic pancreatitis: Secondary | ICD-10-CM

## 2013-07-05 MED ORDER — PROMETHAZINE HCL 12.5 MG PO TABS: 12.5000 mg | ORAL_TABLET | Freq: Four times a day (QID) | ORAL | Status: DC | PRN

## 2013-07-31 ENCOUNTER — Encounter: Payer: Self-pay | Admitting: Internal Medicine

## 2013-07-31 ENCOUNTER — Ambulatory Visit (INDEPENDENT_AMBULATORY_CARE_PROVIDER_SITE_OTHER): Payer: BC Managed Care – PPO | Admitting: Internal Medicine

## 2013-07-31 VITALS — BP 149/97 | HR 90 | Temp 97.2°F | Ht 60.0 in | Wt 115.7 lb

## 2013-07-31 DIAGNOSIS — E785 Hyperlipidemia, unspecified: Secondary | ICD-10-CM

## 2013-07-31 DIAGNOSIS — I10 Essential (primary) hypertension: Secondary | ICD-10-CM

## 2013-07-31 DIAGNOSIS — E119 Type 2 diabetes mellitus without complications: Secondary | ICD-10-CM

## 2013-07-31 DIAGNOSIS — Z0000 Encounter for general adult medical examination without abnormal findings: Secondary | ICD-10-CM

## 2013-07-31 DIAGNOSIS — R109 Unspecified abdominal pain: Secondary | ICD-10-CM

## 2013-07-31 DIAGNOSIS — G8929 Other chronic pain: Secondary | ICD-10-CM

## 2013-07-31 DIAGNOSIS — K861 Other chronic pancreatitis: Secondary | ICD-10-CM

## 2013-07-31 LAB — POCT GLYCOSYLATED HEMOGLOBIN (HGB A1C): HEMOGLOBIN A1C: 7.4

## 2013-07-31 LAB — GLUCOSE, CAPILLARY: GLUCOSE-CAPILLARY: 153 mg/dL — AB (ref 70–99)

## 2013-07-31 MED ORDER — PROMETHAZINE HCL 12.5 MG PO TABS: 12.5000 mg | ORAL_TABLET | Freq: Four times a day (QID) | ORAL | Status: DC | PRN

## 2013-07-31 MED ORDER — LISINOPRIL-HYDROCHLOROTHIAZIDE 20-12.5 MG PO TABS: 2.0000 | ORAL_TABLET | Freq: Every day | ORAL | Status: DC

## 2013-07-31 NOTE — Progress Notes (Signed)
INTERNAL MEDICINE TEACHING ATTENDING ADDENDUM - Murriel Hopper, MD, FACP:  I reviewed and discussed at the time of visit with the resident Dr Karlyn Agee, the patient's medical history, physical examination, diagnosis and results of tests and treatment and I agree with the patient's care as documented. We will call the patient back in one week to assess blood pressure with increase in dose and to check electrolytes to see if she will need a potassium supplement.

## 2013-07-31 NOTE — Progress Notes (Signed)
   Subjective:    Patient ID: [name], female    DOB: 07-03-1955, 58 y.o.   MRN: [id number]  HPI Comments: 58 y.o history of asthma, uterine cancer, iron def. 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  She follows at [facility] Dr. William Dalton for chronic pancreatitis.    She presents for follow up for chronic medical conditions  1) DM 2-more controlled HA1C 7.4% today and cbg 153. She had improved with medications (Metformin 1000 mg bid), diet and exercise.  Will check microAlbumin/Creatinine today 2) HTN-elevated BP today 159/90 will repeat 147/97.  Patient takes BP medication at night but states when she checks her BP at home it runs 062B systolic.  Will check BMET today 3) She needs Rx refill of Phenerghan 4) HM: will do lipid panel and foot exam today  5) Pancreatitis (chronic)-She follows with Dr. Chucky May at [facility] instead of Dr. Alvester Chou as previous.  She will have a procedure at the end of the month to see if she needs another pancreas stent.  She is currently not taking Creon due to affordability but is tolerating food.  She denies abdominal pain subjectively today.  She takes Oxycodone for chronic pain and gets the medication from the pain clinic.    SH: Unemployed, married, 2 sons, likes to plant flowers, she is exercising more, former smoker stopped 2000, denies alcohol/drugs       Review of Systems  Constitutional: Negative for appetite change.  Respiratory: Negative for shortness of breath.   Cardiovascular: Negative for chest pain.  Gastrointestinal: Negative for abdominal pain and constipation.       Objective:   Physical Exam  Nursing note and vitals reviewed. Constitutional: She is  oriented to person, place, and time. She appears well-developed and well-nourished. She is cooperative. No distress.  HENT:  Head: Normocephalic and atraumatic.  Mouth/Throat: Oropharynx is clear and moist and mucous membranes are normal. Abnormal dentition. No oropharyngeal exudate.  Eyes: Conjunctivae are normal. Pupils are equal, round, and reactive to light. Right eye exhibits no discharge. Left eye exhibits no discharge. No scleral icterus.  Cardiovascular: Normal rate, regular rhythm, S1 normal, S2 normal and normal heart sounds.   No murmur heard. No lower extremity edema  Pulmonary/Chest: Effort normal and breath sounds normal. No respiratory distress. She has no wheezes.  Abdominal: Soft. Bowel sounds are normal. There is tenderness in the left upper quadrant.  Mild ttp LUQ  Neurological: She is alert and oriented to person, place, and time.  Skin: Skin is warm, dry and intact. No rash noted. She is not diaphoretic.  Psychiatric: She has a normal mood and affect. Her speech is normal and behavior is normal. Judgment and thought content normal. Cognition and memory are normal.          Assessment & Plan:  F/u in 3 months

## 2013-07-31 NOTE — Assessment & Plan Note (Addendum)
Lab Results  Component Value Date   HGBA1C 7.4 07/31/2013   HGBA1C 6.6 11/20/2012   HGBA1C >14.0 08/02/2012     Assessment: Diabetes control: good control (HgbA1C at goal) Progress toward A1C goal:  improved overall  Comments: none  Plan: Medications:  continue current medications Home glucose monitoring: Frequency: no home glucose monitoring Timing: N/A Instruction/counseling given: discussed foot care and provided printed educational material Educational resources provided: brochure;handout Self management tools provided: other (see comments) Other plans: continue Metformin 1000 mg bid, f/u in 3 months, diet and exercise

## 2013-07-31 NOTE — Addendum Note (Signed)
Addended by: Cresenciano Genre on: 07/31/2013 05:11 PM   Modules accepted: Level of Service

## 2013-07-31 NOTE — Assessment & Plan Note (Addendum)
Lipid and foot exam today

## 2013-07-31 NOTE — Assessment & Plan Note (Addendum)
Stable no exacerbation of pancreatitis today She follows with [facility] and will have a procedure at the end of this month Continue chronic pain medications (Oxycodone). She goes to the pain clinic to get the medications.   Rx refill of Phenerghan today #60 with RF x 1

## 2013-07-31 NOTE — Assessment & Plan Note (Signed)
BP Readings from Last 3 Encounters:  07/31/13 149/97  01/28/13 144/84  01/16/13 141/74    Lab Results  Component Value Date   NA 141 11/21/2012   K 4.1 11/21/2012   CREATININE 0.61 11/21/2012    Assessment: Blood pressure control: mildly elevated Progress toward BP goal:  unchanged Comments: elevated to systolic BP 381R   Plan: Medications:  continue current medications but increased Prinzide from 20-12.5 mg to 40-25 mg  Educational resources provided: brochure;handout;video Self management tools provided: other (see comments) Other plans: BMET, f/u in 3 months

## 2013-07-31 NOTE — Assessment & Plan Note (Signed)
Lipid Panel     Component Value Date/Time   CHOL 260* 08/02/2012 1706   TRIG 126 08/02/2012 1706   HDL 89 08/02/2012 1706   CHOLHDL 2.9 08/02/2012 1706   VLDL 25 08/02/2012 1706   LDLCALC 146* 08/02/2012 1706   Will check lipid panel today

## 2013-07-31 NOTE — Patient Instructions (Addendum)
General Instructions: Please take 2 pills of your blood pressure medication Lisinopril-Hydrochlorothiazide  I will send you a letter with your labs Good luck with your procedure  Follow up in 3 months  Continue the good work with exercise and your diabetes   Treatment Goals:  Goals (1 Years of Data) as of 07/31/13         As of Today As of Today 01/28/13 01/16/13 11/27/12     Blood Pressure    . Blood Pressure < 140/90  149/97 159/90 144/84 141/74 140/85     Result Component    . HEMOGLOBIN A1C < 7.0  7.4        . LDL CALC < 100            Progress Toward Treatment Goals:  Treatment Goal 07/31/2013  Hemoglobin A1C improved  Blood pressure unchanged    Self Care Goals & Plans:  Self Care Goal 07/31/2013  Manage my medications take my medicines as prescribed; bring my medications to every visit; refill my medications on time; follow the sick day instructions if I am sick  Monitor my health keep track of my blood pressure; keep track of my weight; check my feet daily  Eat healthy foods eat more vegetables; eat fruit for snacks and desserts; eat foods that are low in salt; eat baked foods instead of fried foods; eat smaller portions; drink diet soda or water instead of juice or soda  Be physically active find an activity I enjoy  Meeting treatment goals maintain the current self-care plan    Home Blood Glucose Monitoring 07/31/2013  Check my blood sugar no home glucose monitoring  When to check my blood sugar N/A     Care Management & Community Referrals:  Referral 07/31/2013  Referrals made for care management support none needed  Referrals made to community resources -       Type 2 Diabetes Mellitus, Adult Type 2 diabetes mellitus is a long-term (chronic) disease. In type 2 diabetes:  The pancreas does not make enough of a hormone called insulin.  The cells in the body do not respond as well to the insulin that is made.  Both of the above can happen. Normally, insulin  moves sugars from food into tissue cells. This gives you energy. If you have type 2 diabetes, sugars cannot be moved into tissue cells. This causes high blood sugar (hyperglycemia).  HOME CARE  Have your hemoglobin A1c level checked twice a year. The level shows if your diabetes is under control or out of control.  Perform daily blood sugar testing as told by your doctor.  Check your ketone levels by testing your pee (urine) when you are sick and as told.  Take your diabetes or insulin medicine as told by your doctor.  Never run out of insulin.  Adjust how much insulin you give yourself based on how many carbs (carbohydrates) you eat. Carbs are in many foods, such as fruits, vegetables, whole grains, and dairy products.  Have a healthy snack between every healthy meal. Have 3 meals and 3 snacks a day.  Lose weight if you are overweight.  Carry a medical alert card or wear your medical alert jewelry.  Carry a 15 gram carb snack with you at all times. Examples include:  Glucose pills, 3 or 4.  Glucose gel, 15 gram tube.  Raisins, 2 tablespoons (24 grams).  Jelly beans, 6.  Animal crackers, 8.  Sugar pop, 4 ounces (120 milliliters).  Gummy  treats, 9.  Notice low blood sugar (hypoglycemia) symptoms, such as:  Shaking (tremors).  Decreased ability to think clearly.  Sweating.  Increased heart rate.  Headache.  Dry mouth.  Hunger.  Crabbiness (irritability).  Being worried or tense (anxiety).  Restless sleep.  A change in speech or coordination.  Confusion.  Treat low blood sugar right away. If you are alert and can swallow, follow the 15:15 rule:  Take 15 20 grams of a rapid-acting glucose or carb. This includes glucose gel, glucose pills, or 4 ounces (120 milliliters) of fruit juice, regular pop, or low-fat milk.  Check your blood sugar level after taking the glucose.  Take 15 20 grams of more glucose if the repeat blood sugar level is still 70 mg/dL  (milligrams/deciliter) or below.  Eat a meal or snack within 1 hour of the blood sugar levels going back to normal.  Notice early symptoms of high blood sugar, such as:  Being really thirsty or drinking a lot (polydipsia).  Peeing (urinating) a lot (polyuria).  Do at least 150 minutes of physical activity a week or as told.  Split the 150 minutes of activity up during the week. Do not do 150 minutes of activity in one day.  Perform exercises, such as weight lifting, at least 2 times a week or as told.  Adjust your insulin or food intake as needed if you start a new exercise or sport.  Follow your sick day plan when you are not able to eat or drink as usual.  Avoid tobacco use.  Women who are not pregnant should drink no more than 1 drink a day. Men should drink no more than 2 drinks a day.  Only drink alcohol with food.  Ask your doctor if alcohol is safe for you.  Tell your doctor if you drink alcohol several times during the week.  See your doctor regularly.  Schedule an eye exam soon after you are diagnosed with diabetes. Schedule exams once every year.  Check your skin and feet every day. Check for cuts, bruises, redness, nail problems, bleeding, blisters, or sores. A doctor should do a foot exam once a year.  Brush your teeth and gums twice a day. Floss once a day. Visit your dentist regularly.  Share your diabetes plan with your workplace or school.  Stay up-to-date with shots that fight against diseases (immunizations).  Learn how to manage stress.  Get diabetes education and support as needed.  Ask your doctor for special help if:  You need help to maintain or improve how you to do things on your own.  You need help to maintain or improve the quality of your life.  You have foot or hand problems.  You have trouble cleaning yourself, dressing, eating, or doing physical activity. GET HELP RIGHT AWAY IF:  You have trouble breathing.  You have moderate  to large ketone levels.  You are unable to eat food or drink fluids for more than 6 hours.  You feel sick to your stomach (nauseous) or throw up (vomit) for more than 6 hours.  Your blood sugar level is over 240 mg/dL.  There is a change in mental status.  You get another serious illness.  You have watery poop (diarrhea) for more than 6 hours.  You have been sick or have had a fever for 2 or more days and are not getting better.  You have pain when you are physically active. MAKE SURE YOU:  Understand these instructions.  Will watch your condition.  Will get help right away if you are not doing well or get worse. Document Released: 01/26/2008 Document Revised: 02/06/2013 Document Reviewed: 08/17/2012 [facility] Patient Information 2014 [place], [place].  DASH Diet The DASH diet stands for "Dietary Approaches to Stop Hypertension." It is a healthy eating plan that has been shown to reduce high blood pressure (hypertension) in as little as 14 days, while also possibly providing other significant health benefits. These other health benefits include reducing the risk of breast cancer after menopause and reducing the risk of type 2 diabetes, heart disease, colon cancer, and stroke. Health benefits also include weight loss and slowing kidney failure in patients with chronic kidney disease.  DIET GUIDELINES  Limit salt (sodium). Your diet should contain less than 1500 mg of sodium daily.  Limit refined or processed carbohydrates. Your diet should include mostly whole grains. Desserts and added sugars should be used sparingly.  Include small amounts of heart-healthy fats. These types of fats include nuts, oils, and tub margarine. Limit saturated and trans fats. These fats have been shown to be harmful in the body. CHOOSING FOODS  The following food groups are based on a 2000 calorie diet. See your Registered Dietitian for individual calorie needs. Grains and Grain Products (6 to 8 servings  daily)  Eat More Often: Whole-wheat bread, brown rice, whole-grain or wheat pasta, quinoa, popcorn without added fat or salt (air popped).  Eat Less Often: White bread, white pasta, white rice, cornbread. Vegetables (4 to 5 servings daily)  Eat More Often: Fresh, frozen, and canned vegetables. Vegetables may be raw, steamed, roasted, or grilled with a minimal amount of fat.  Eat Less Often/Avoid: Creamed or fried vegetables. Vegetables in a cheese sauce. Fruit (4 to 5 servings daily)  Eat More Often: All fresh, canned (in natural juice), or frozen fruits. Dried fruits without added sugar. One hundred percent fruit juice ( cup [237 mL] daily).  Eat Less Often: Dried fruits with added sugar. Canned fruit in light or heavy syrup. [employer], Fish, and Poultry (2 servings or less daily. One serving is 3 to 4 oz [85-114 g]).  Eat More Often: Ninety percent or leaner ground beef, tenderloin, sirloin. Round cuts of beef, chicken breast, [place] breast. All fish. Grill, bake, or broil your meat. Nothing should be fried.  Eat Less Often/Avoid: Fatty cuts of meat, [place], or chicken leg, thigh, or wing. Fried cuts of meat or fish. Dairy (2 to 3 servings)  Eat More Often: Low-fat or fat-free milk, low-fat plain or light yogurt, reduced-fat or part-skim cheese.  Eat Less Often/Avoid: Milk (whole, 2%).Whole milk yogurt. Full-fat cheeses. Nuts, Seeds, and Legumes (4 to 5 servings per week)  Eat More Often: All without added salt.  Eat Less Often/Avoid: Salted nuts and seeds, canned beans with added salt. Fats and Sweets (limited)  Eat More Often: Vegetable oils, tub margarines without trans fats, sugar-free gelatin. Mayonnaise and salad dressings.  Eat Less Often/Avoid: Coconut oils, palm oils, butter, stick margarine, cream, half and half, cookies, candy, pie. FOR MORE INFORMATION The Dash Diet Eating Plan: www.dashdiet.org Document Released: 04/07/2011 Document Revised: 07/11/2011 Document  Reviewed: 04/07/2011 [facility] Patient Information 2014 [place], [place].  Hypertension As your heart beats, it forces blood through your arteries. This force is your blood pressure. If the pressure is too high, it is called hypertension (HTN) or high blood pressure. HTN is dangerous because you may have it and not know it. High blood pressure may mean that your  heart has to work harder to pump blood. Your arteries may be narrow or stiff. The extra work puts you at risk for heart disease, stroke, and other problems.  Blood pressure consists of two numbers, a higher number over a lower, 110/72, for example. It is stated as "110 over 72." The ideal is below 120 for the top number (systolic) and under 80 for the bottom (diastolic). Write down your blood pressure today. You should pay close attention to your blood pressure if you have certain conditions such as:  Heart failure.  Prior heart attack.  Diabetes  Chronic kidney disease.  Prior stroke.  Multiple risk factors for heart disease. To see if you have HTN, your blood pressure should be measured while you are seated with your arm held at the level of the heart. It should be measured at least twice. A one-time elevated blood pressure reading (especially in the Emergency Department) does not mean that you need treatment. There may be conditions in which the blood pressure is different between your right and left arms. It is important to see your caregiver soon for a recheck. Most people have essential hypertension which means that there is not a specific cause. This type of high blood pressure may be lowered by changing lifestyle factors such as:  Stress.  Smoking.  Lack of exercise.  Excessive weight.  Drug/tobacco/alcohol use.  Eating less salt. Most people do not have symptoms from high blood pressure until it has caused damage to the body. Effective treatment can often prevent, delay or reduce that damage. TREATMENT  When a cause  has been identified, treatment for high blood pressure is directed at the cause. There are a large number of medications to treat HTN. These fall into several categories, and your caregiver will help you select the medicines that are best for you. Medications may have side effects. You should review side effects with your caregiver. If your blood pressure stays high after you have made lifestyle changes or started on medicines,   Your medication(s) may need to be changed.  Other problems may need to be addressed.  Be certain you understand your prescriptions, and know how and when to take your medicine.  Be sure to follow up with your caregiver within the time frame advised (usually within two weeks) to have your blood pressure rechecked and to review your medications.  If you are taking more than one medicine to lower your blood pressure, make sure you know how and at what times they should be taken. Taking two medicines at the same time can result in blood pressure that is too low. SEEK IMMEDIATE MEDICAL CARE IF:  You develop a severe headache, blurred or changing vision, or confusion.  You have unusual weakness or numbness, or a faint feeling.  You have severe chest or abdominal pain, vomiting, or breathing problems. MAKE SURE YOU:   Understand these instructions.  Will watch your condition.  Will get help right away if you are not doing well or get worse. Document Released: 04/18/2005 Document Revised: 07/11/2011 Document Reviewed: 12/07/2007 [facility] Patient Information 2014 [place].  Acute Pancreatitis Acute pancreatitis is a disease in which the pancreas becomes suddenly irritated (inflamed). The pancreas is a large gland behind your stomach. The pancreas makes enzymes that help digest food. The pancreas also makes 2 hormones that help control your blood sugar. Acute pancreatitis happens when the enzymes attack and damage the pancreas. Most attacks last a couple of days  and can cause  serious problems. HOME CARE  Follow your doctor's diet instructions. You may need to avoid alcohol and limit fat in your diet.  Eat small meals often.  Drink enough fluids to keep your pee (urine) clear or pale yellow.  Only take medicines as told by your doctor.  Avoid drinking alcohol if it caused your disease.  Do not smoke.  Get plenty of rest.  Check your blood sugar at home as told by your doctor.  Keep all doctor visits as told. GET HELP RIGHT AWAY IF:   You are unable to eat or keep fluids down.  Your pain becomes severe.  You have a fever or lasting symptoms for more than 2 to 3 days.  You have a fever and your symptoms suddenly get worse.  Your skin or the white part of your eyes turn yellow (jaundice).  You throw up (vomit).  You feel dizzy, or you pass out (faint).  Your blood sugar is high (over 300 mg/dL).  You do not get better as quickly as expected.  You have new or worsening symptoms.  You have lasting pain, weakness, or feel sick to your stomach (nauseous).  You get better and then have another pain attack. MAKE SURE YOU:   Understand these instructions.  Will watch your condition.  Will get help right away if you are not doing well or get worse. Document Released: 10/05/2007 Document Revised: 10/18/2011 Document Reviewed: 07/28/2011 [facility] Patient Information 2014 [place].

## 2013-08-01 ENCOUNTER — Encounter: Payer: Self-pay | Admitting: Internal Medicine

## 2013-08-01 LAB — LIPID PANEL
Cholesterol: 246 mg/dL — ABNORMAL HIGH (ref 0–200)
HDL: 87 mg/dL
LDL Cholesterol: 143 mg/dL — ABNORMAL HIGH (ref 0–99)
Total CHOL/HDL Ratio: 2.8 ratio
Triglycerides: 82 mg/dL
VLDL: 16 mg/dL (ref 0–40)

## 2013-08-01 LAB — BASIC METABOLIC PANEL WITHOUT GFR
BUN: 12 mg/dL (ref 6–23)
CO2: 29 meq/L (ref 19–32)
Calcium: 10.2 mg/dL (ref 8.4–10.5)
Chloride: 99 meq/L (ref 96–112)
Creat: 0.72 mg/dL (ref 0.50–1.10)
GFR, Est African American: >89 mL/min
GFR, Est Non African American: >89 mL/min
Glucose, Bld: 129 mg/dL — ABNORMAL HIGH (ref 70–99)
Potassium: 3.9 meq/L (ref 3.5–5.3)
Sodium: 137 meq/L (ref 135–145)

## 2013-08-01 LAB — MICROALBUMIN / CREATININE URINE RATIO
CREATININE, URINE: 60.8 mg/dL
MICROALB/CREAT RATIO: 8.2 mg/g (ref 0.0–30.0)
Microalb, Ur: 0.5 mg/dL (ref 0.00–1.89)

## 2013-08-08 ENCOUNTER — Encounter: Payer: Self-pay | Admitting: Internal Medicine

## 2013-08-12 ENCOUNTER — Encounter: Payer: Self-pay | Admitting: Internal Medicine

## 2013-08-12 ENCOUNTER — Ambulatory Visit (INDEPENDENT_AMBULATORY_CARE_PROVIDER_SITE_OTHER): Payer: BC Managed Care – PPO | Admitting: Internal Medicine

## 2013-08-12 VITALS — BP 144/84 | HR 60 | Ht 60.0 in | Wt 119.0 lb

## 2013-08-12 DIAGNOSIS — I10 Essential (primary) hypertension: Secondary | ICD-10-CM

## 2013-08-12 DIAGNOSIS — G8929 Other chronic pain: Secondary | ICD-10-CM

## 2013-08-12 DIAGNOSIS — R109 Unspecified abdominal pain: Secondary | ICD-10-CM

## 2013-08-12 NOTE — Assessment & Plan Note (Signed)
Chronic pancreatitis will follow with [facility] 08/13/13

## 2013-08-12 NOTE — Progress Notes (Signed)
   Subjective:    Patient ID: [name], female    DOB: [birth year], 58 y.o.   MRN: [id number]  HPI Comments: 58 y.o history of asthma, uterine cancer, iron def. 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  She follows at [facility] Dr. William Dalton for chronic pancreatitis.     She presents for follow up for HTN 1) HTN-She was called to have BP checked today and told to come in for an appt.  Her BP was elevated today 155/92 initially then went to 144/84.  She is not sx'matic.  She takes her BP medication at night and is taking Prinzide 40-25 mg daily  2) History of pancreatitis follows at [facility] will follow up with them 08/13/13       Review of Systems  Respiratory: Negative for shortness of breath.   Cardiovascular: Negative for chest pain and leg swelling.  Gastrointestinal:       Mild LUQ ab pain   Neurological: Negative for dizziness, light-headedness and headaches.       Objective:   Physical Exam  Nursing note and vitals reviewed. Constitutional: She is oriented to person, place, and time. She appears well-developed and well-nourished. She is cooperative. No distress.  HENT:  Head: Normocephalic and atraumatic.  Mouth/Throat: Oropharynx is clear and moist and mucous membranes are normal. Abnormal dentition. No oropharyngeal exudate.  Eyes: Conjunctivae are normal. Pupils are equal, round, and reactive to light. Right eye exhibits no discharge. Left eye exhibits no discharge. No scleral icterus.  Cardiovascular: Normal rate, regular rhythm, S1 normal, S2 normal and normal heart sounds.   No murmur heard. No lower ext edema  Pulmonary/Chest: Effort normal and breath sounds normal. No respiratory  distress. She has no wheezes.  Abdominal: Soft. Bowel sounds are normal. There is tenderness in the left upper quadrant.  Mild LUQ ttp   Neurological: She is alert and oriented to person, place, and time. Gait normal.  Skin: Skin is warm, dry and intact. No rash noted. She is not diaphoretic.     Psychiatric: She has a normal mood and affect. Her speech is normal and behavior is normal. Judgment and thought content normal. Cognition and memory are normal.          Assessment & Plan:  F/u 09/2013

## 2013-08-12 NOTE — Patient Instructions (Addendum)
General Instructions: Follow up in 10/2013 with your regular doctor [facility]. Thanks for coming in your blood pressure improved today to 144/84. Take it at home or drug store and record   Treatment Goals:  Goals (1 Years of Data) as of 08/12/13         As of Today 07/31/13 07/31/13 01/28/13 01/16/13     Blood Pressure    . Blood Pressure < 140/90  155/92 149/97 159/90 144/84 141/74     Result Component    . HEMOGLOBIN A1C < 7.0   7.4       . HEMOGLOBIN A1C < 7.0   7.4       . LDL CALC < 100   143         Progress Toward Treatment Goals:  Treatment Goal 08/12/2013  Hemoglobin A1C deteriorated  Blood pressure improved    Self Care Goals & Plans:  Self Care Goal 08/12/2013  Manage my medications take my medicines as prescribed; bring my medications to every visit; refill my medications on time  Monitor my health keep track of my blood pressure; bring my blood pressure log to each visit; keep track of my weight  Eat healthy foods drink diet soda or water instead of juice or soda; eat more vegetables; eat foods that are low in salt; eat baked foods instead of fried foods; eat fruit for snacks and desserts; eat smaller portions  Be physically active find an activity I enjoy  Meeting treatment goals maintain the current self-care plan    Home Blood Glucose Monitoring 08/12/2013  Check my blood sugar no home glucose monitoring  When to check my blood sugar N/A     Care Management & Community Referrals:  Referral 08/12/2013  Referrals made for care management support none needed  Referrals made to community resources none       DASH Diet The DASH diet stands for "Dietary Approaches to Stop Hypertension." It is a healthy eating plan that has been shown to reduce high blood pressure (hypertension) in as little as 14 days, while also possibly providing other significant health benefits. These other health benefits include reducing the risk of breast cancer after menopause and reducing  the risk of type 2 diabetes, heart disease, colon cancer, and stroke. Health benefits also include weight loss and slowing kidney failure in patients with chronic kidney disease.  DIET GUIDELINES  Limit salt (sodium). Your diet should contain less than 1500 mg of sodium daily.  Limit refined or processed carbohydrates. Your diet should include mostly whole grains. Desserts and added sugars should be used sparingly.  Include small amounts of heart-healthy fats. These types of fats include nuts, oils, and tub margarine. Limit saturated and trans fats. These fats have been shown to be harmful in the body. CHOOSING FOODS  The following food groups are based on a 2000 calorie diet. See your Registered Dietitian for individual calorie needs. Grains and Grain Products (6 to 8 servings daily)  Eat More Often: Whole-wheat bread, brown rice, whole-grain or wheat pasta, quinoa, popcorn without added fat or salt (air popped).  Eat Less Often: White bread, white pasta, white rice, cornbread. Vegetables (4 to 5 servings daily)  Eat More Often: Fresh, frozen, and canned vegetables. Vegetables may be raw, steamed, roasted, or grilled with a minimal amount of fat.  Eat Less Often/Avoid: Creamed or fried vegetables. Vegetables in a cheese sauce. Fruit (4 to 5 servings daily)  Eat More Often: All fresh, canned (in natural  juice), or frozen fruits. Dried fruits without added sugar. One hundred percent fruit juice ( cup [237 mL] daily).  Eat Less Often: Dried fruits with added sugar. Canned fruit in light or heavy syrup. [employer], Fish, and Poultry (2 servings or less daily. One serving is 3 to 4 oz [85-114 g]).  Eat More Often: Ninety percent or leaner ground beef, tenderloin, sirloin. Round cuts of beef, chicken breast, [place] breast. All fish. Grill, bake, or broil your meat. Nothing should be fried.  Eat Less Often/Avoid: Fatty cuts of meat, [place], or chicken leg, thigh, or wing. Fried cuts of meat  or fish. Dairy (2 to 3 servings)  Eat More Often: Low-fat or fat-free milk, low-fat plain or light yogurt, reduced-fat or part-skim cheese.  Eat Less Often/Avoid: Milk (whole, 2%).Whole milk yogurt. Full-fat cheeses. Nuts, Seeds, and Legumes (4 to 5 servings per week)  Eat More Often: All without added salt.  Eat Less Often/Avoid: Salted nuts and seeds, canned beans with added salt. Fats and Sweets (limited)  Eat More Often: Vegetable oils, tub margarines without trans fats, sugar-free gelatin. Mayonnaise and salad dressings.  Eat Less Often/Avoid: Coconut oils, palm oils, butter, stick margarine, cream, half and half, cookies, candy, pie. FOR MORE INFORMATION The Dash Diet Eating Plan: www.dashdiet.org Document Released: 04/07/2011 Document Revised: 07/11/2011 Document Reviewed: 04/07/2011 [facility] Patient Information 2014 [place], [place].  Hypertension As your heart beats, it forces blood through your arteries. This force is your blood pressure. If the pressure is too high, it is called hypertension (HTN) or high blood pressure. HTN is dangerous because you may have it and not know it. High blood pressure may mean that your heart has to work harder to pump blood. Your arteries may be narrow or stiff. The extra work puts you at risk for heart disease, stroke, and other problems.  Blood pressure consists of two numbers, a higher number over a lower, 110/72, for example. It is stated as "110 over 72." The ideal is below 120 for the top number (systolic) and under 80 for the bottom (diastolic). Write down your blood pressure today. You should pay close attention to your blood pressure if you have certain conditions such as:  Heart failure.  Prior heart attack.  Diabetes  Chronic kidney disease.  Prior stroke.  Multiple risk factors for heart disease. To see if you have HTN, your blood pressure should be measured while you are seated with your arm held at the level of the heart. It  should be measured at least twice. A one-time elevated blood pressure reading (especially in the Emergency Department) does not mean that you need treatment. There may be conditions in which the blood pressure is different between your right and left arms. It is important to see your caregiver soon for a recheck. Most people have essential hypertension which means that there is not a specific cause. This type of high blood pressure may be lowered by changing lifestyle factors such as:  Stress.  Smoking.  Lack of exercise.  Excessive weight.  Drug/tobacco/alcohol use.  Eating less salt. Most people do not have symptoms from high blood pressure until it has caused damage to the body. Effective treatment can often prevent, delay or reduce that damage. TREATMENT  When a cause has been identified, treatment for high blood pressure is directed at the cause. There are a large number of medications to treat HTN. These fall into several categories, and your caregiver will help you select the medicines that are  best for you. Medications may have side effects. You should review side effects with your caregiver. If your blood pressure stays high after you have made lifestyle changes or started on medicines,   Your medication(s) may need to be changed.  Other problems may need to be addressed.  Be certain you understand your prescriptions, and know how and when to take your medicine.  Be sure to follow up with your caregiver within the time frame advised (usually within two weeks) to have your blood pressure rechecked and to review your medications.  If you are taking more than one medicine to lower your blood pressure, make sure you know how and at what times they should be taken. Taking two medicines at the same time can result in blood pressure that is too low. SEEK IMMEDIATE MEDICAL CARE IF:  You develop a severe headache, blurred or changing vision, or confusion.  You have unusual weakness or  numbness, or a faint feeling.  You have severe chest or abdominal pain, vomiting, or breathing problems. MAKE SURE YOU:   Understand these instructions.  Will watch your condition.  Will get help right away if you are not doing well or get worse. Document Released: 04/18/2005 Document Revised: 07/11/2011 Document Reviewed: 12/07/2007 [facility] Patient Information 2014 [place].

## 2013-08-12 NOTE — Assessment & Plan Note (Signed)
BP Readings from Last 3 Encounters:  08/12/13 144/84  07/31/13 149/97  01/28/13 144/84    Lab Results  Component Value Date   NA 137 07/31/2013   K 3.9 07/31/2013   CREATININE 0.72 07/31/2013    Assessment: Blood pressure control: mildly elevated Progress toward BP goal:  improved Comments: BP 155/92 then 144/84 on repeat   Plan: Medications:  continue current medications (Prinzide 40-25 mg qd) Educational resources provided: other (see comments) Self management tools provided: other (see comments) Other plans: f/u with PCP 09/2013

## 2013-08-13 NOTE — Progress Notes (Signed)
INTERNAL MEDICINE TEACHING ATTENDING ADDENDUM - Teran Knittle, MD: I reviewed and discussed at the time of visit with the resident Dr. McLean, the patient's medical history, physical examination, diagnosis and results of tests and treatment and I agree with the patient's care as documented. 

## 2013-10-01 ENCOUNTER — Encounter: Payer: Self-pay | Admitting: Internal Medicine

## 2013-10-01 ENCOUNTER — Ambulatory Visit (INDEPENDENT_AMBULATORY_CARE_PROVIDER_SITE_OTHER): Payer: BC Managed Care – PPO | Admitting: Internal Medicine

## 2013-10-01 VITALS — BP 136/74 | HR 68 | Temp 97.1°F | Ht 60.0 in | Wt 116.1 lb

## 2013-10-01 DIAGNOSIS — K861 Other chronic pancreatitis: Secondary | ICD-10-CM

## 2013-10-01 DIAGNOSIS — I10 Essential (primary) hypertension: Secondary | ICD-10-CM

## 2013-10-01 DIAGNOSIS — E119 Type 2 diabetes mellitus without complications: Secondary | ICD-10-CM

## 2013-10-01 LAB — CBC WITH DIFFERENTIAL/PLATELET
BASOS ABS: 0 10*3/uL (ref 0.0–0.1)
BASOS PCT: 0 % (ref 0–1)
EOS ABS: 0.1 10*3/uL (ref 0.0–0.7)
Eosinophils Relative: 1 % (ref 0–5)
HCT: 35.4 % — ABNORMAL LOW (ref 36.0–46.0)
Hemoglobin: 12.3 g/dL (ref 12.0–15.0)
LYMPHS ABS: 3.4 10*3/uL (ref 0.7–4.0)
Lymphocytes Relative: 49 % — ABNORMAL HIGH (ref 12–46)
MCH: 26.1 pg (ref 26.0–34.0)
MCHC: 34.7 g/dL (ref 30.0–36.0)
MCV: 75 fL — ABNORMAL LOW (ref 78.0–100.0)
Monocytes Absolute: 0.8 10*3/uL (ref 0.1–1.0)
Monocytes Relative: 11 % (ref 3–12)
NEUTROS PCT: 39 % — AB (ref 43–77)
Neutro Abs: 2.7 10*3/uL (ref 1.7–7.7)
PLATELETS: 365 10*3/uL (ref 150–400)
RBC: 4.72 MIL/uL (ref 3.87–5.11)
RDW: 15.2 % (ref 11.5–15.5)
WBC: 7 10*3/uL (ref 4.0–10.5)

## 2013-10-01 LAB — GLUCOSE, CAPILLARY: Glucose-Capillary: 152 mg/dL — ABNORMAL HIGH (ref 70–99)

## 2013-10-01 MED ORDER — PROMETHAZINE HCL 12.5 MG PO TABS: 12.5000 mg | ORAL_TABLET | Freq: Four times a day (QID) | ORAL | Status: DC | PRN

## 2013-10-01 NOTE — Patient Instructions (Signed)
General Instructions: We will be doing a blood test today. Your blood sugar is well controlled. Please continue as you have been doing. This is very commendable.   Please bring your medicines with you each time you come to clinic.  Medicines may include prescription medications, over-the-counter medications, herbal remedies, eye drops, vitamins, or other pills.   Progress Toward Treatment Goals:  Treatment Goal 08/12/2013  Hemoglobin A1C deteriorated  Blood pressure improved    Self Care Goals & Plans:  Self Care Goal 10/01/2013  Manage my medications take my medicines as prescribed; bring my medications to every visit; refill my medications on time  Monitor my health -  Eat healthy foods drink diet soda or water instead of juice or soda; eat more vegetables; eat foods that are low in salt; eat baked foods instead of fried foods; eat fruit for snacks and desserts  Be physically active -  Meeting treatment goals -    Home Blood Glucose Monitoring 08/12/2013  Check my blood sugar no home glucose monitoring  When to check my blood sugar N/A     Care Management & Community Referrals:  Referral 08/12/2013  Referrals made for care management support none needed  Referrals made to community resources none     Diabetes Meal Planning Guide The diabetes meal planning guide is a tool to help you plan your meals and snacks. It is important for people with diabetes to manage their blood glucose (sugar) levels. Choosing the right foods and the right amounts throughout your day will help control your blood glucose. Eating right can even help you improve your blood pressure and reach or maintain a healthy weight. CARBOHYDRATE COUNTING MADE EASY When you eat carbohydrates, they turn to sugar. This raises your blood glucose level. Counting carbohydrates can help you control this level so you feel better. When you plan your meals by counting carbohydrates, you can have more flexibility in what you eat  and balance your medicine with your food intake. Carbohydrate counting simply means adding up the total amount of carbohydrate grams in your meals and snacks. Try to eat about the same amount at each meal. Foods with carbohydrates are listed below. Each portion below is 1 carbohydrate serving or 15 grams of carbohydrates. Ask your dietician how many grams of carbohydrates you should eat at each meal or snack. Grains and Starches  1 slice bread.   English muffin or hotdog/hamburger bun.   cup cold cereal (unsweetened).   cup cooked pasta or rice.   cup starchy vegetables (corn, potatoes, peas, beans, winter squash).  1 tortilla (6 inches).   bagel.  1 waffle or pancake (size of a CD).   cup cooked cereal.  4 to 6 small crackers. *Whole grain is recommended. Fruit  1 cup fresh unsweetened berries, melon, papaya, pineapple.  1 small fresh fruit.   banana or mango.   cup fruit juice (4 oz unsweetened).   cup canned fruit in natural juice or water.  2 tbs dried fruit.  12 to 15 grapes or cherries. Milk and Yogurt  1 cup fat-free or 1% milk.  1 cup soy milk.  6 oz light yogurt with sugar-free sweetener.  6 oz low-fat soy yogurt.  6 oz plain yogurt. Vegetables  1 cup raw or  cup cooked is counted as 0 carbohydrates or a "free" food.  If you eat 3 or more servings at 1 meal, count them as 1 carbohydrate serving. Other Carbohydrates   oz chips or pretzels.  cup ice cream or frozen yogurt.   cup sherbet or sorbet.  2 inch square cake, no frosting.  1 tbs honey, sugar, jam, jelly, or syrup.  2 small cookies.  3 squares of graham crackers.  3 cups popcorn.  6 crackers.  1 cup broth-based soup.  Count 1 cup casserole or other mixed foods as 2 carbohydrate servings.  Foods with less than 20 calories in a serving may be counted as 0 carbohydrates or a "free" food. You may want to purchase a book or computer software that lists the  carbohydrate gram counts of different foods. In addition, the nutrition facts panel on the labels of the foods you eat are a good source of this information. The label will tell you how big the serving size is and the total number of carbohydrate grams you will be eating per serving. Divide this number by 15 to obtain the number of carbohydrate servings in a portion. Remember, 1 carbohydrate serving equals 15 grams of carbohydrate. SERVING SIZES Measuring foods and serving sizes helps you make sure you are getting the right amount of food. The list below tells how big or small some common serving sizes are.  1 oz.........4 stacked dice.  3 oz........Marland KitchenDeck of cards.  1 tsp.......Marland KitchenTip of little finger.  1 tbs......Marland KitchenMarland KitchenThumb.  2 tbs.......Marland KitchenGolf ball.   cup......Marland KitchenHalf of a fist.  1 cup.......Marland KitchenA fist. SAMPLE DIABETES MEAL PLAN Below is a sample meal plan that includes foods from the grain and starches, dairy, vegetable, fruit, and meat groups. A dietician can individualize a meal plan to fit your calorie needs and tell you the number of servings needed from each food group. However, controlling the total amount of carbohydrates in your meal or snack is more important than making sure you include all of the food groups at every meal. You may interchange carbohydrate containing foods (dairy, starches, and fruits). The meal plan below is an example of a 2000 calorie diet using carbohydrate counting. This meal plan has 17 carbohydrate servings. Breakfast  1 cup oatmeal (2 carb servings).   cup light yogurt (1 carb serving).  1 cup blueberries (1 carb serving).   cup almonds. Snack  1 large apple (2 carb servings).  1 low-fat string cheese stick. Lunch  Chicken breast salad.  1 cup spinach.   cup chopped tomatoes.  2 oz chicken breast, sliced.  2 tbs low-fat [place] dressing.  12 whole-wheat crackers (2 carb servings).  12 to 15 grapes (1 carb serving).  1 cup low-fat milk  (1 carb serving). Snack  1 cup carrots.   cup hummus (1 carb serving). Dinner  3 oz broiled salmon.  1 cup brown rice (3 carb servings). Snack  1  cups steamed broccoli (1 carb serving) drizzled with 1 tsp olive oil and lemon juice.  1 cup light pudding (2 carb servings). DIABETES MEAL PLANNING WORKSHEET Your dietician can use this worksheet to help you decide how many servings of foods and what types of foods are right for you.  BREAKFAST Food Group and Servings / Carb Servings Grain/Starches __________________________________ Dairy __________________________________________ Vegetable ______________________________________ Fruit ___________________________________________ Meat __________________________________________ Fat ____________________________________________ LUNCH Food Group and Servings / Carb Servings Grain/Starches ___________________________________ Dairy ___________________________________________ Fruit ____________________________________________ Meat ___________________________________________ Fat _____________________________________________ W[employer] Food Group and Servings / Carb Servings Grain/Starches ___________________________________ Dairy ___________________________________________ Fruit ____________________________________________ Meat ___________________________________________ Fat _____________________________________________ SNACKS Food Group and Servings / Carb Servings Grain/Starches ___________________________________ Dairy ___________________________________________ Vegetable _______________________________________ Fruit ____________________________________________ Meat ___________________________________________ Fat _____________________________________________ DAILY TOTALS Starches _________________________ Vegetable ________________________  Fruit ____________________________ Dairy ____________________________ Meat  ____________________________ Fat ______________________________

## 2013-10-01 NOTE — Progress Notes (Signed)
Patient ID: [name], female   DOB: [birth year], 58 y.o.   MRN: [id number]   Subjective:   Patient ID: [name] female   DOB: [birth year] 58 y.o.   MRN: [id number]  HPI: [name] is a 58 y.o. with PMH of HTN, GERD, Chronic pancreatitis, DM2, HLD, chronic iron deficiency anemia.  Presented today for routine follow up. No complaint today. Asked for refill of phernegan.  Past Medical History  Diagnosis Date  . Hypertension   . HLD (hyperlipidemia) 2009  . Sickle cell trait   . Pulmonary embolus 08/2004    Two areas of V/Q mismatch. Findings compatible  with high probability for pulmonary embolus.; pt was on coumadin for 1 year  . Pancreatic divisum     S/P ERCP with stenting 07/2010 at [facility], then stent removal (08/05/2010)  . CVA (cerebral infarction) 1993    Per report by patient she had a stroke and prolonged rehab course eventually resolving her right sided weakness, as per her hx obtained 08/2007  . Blood dyscrasia     thalasemia, sickle cell trait  . Chronic abdominal pain     Unclear etiology, thought to be due to chronic pancreatitis previously in setting of her pancreatic divisum - however, EUS and EGD performed at [place]  (10/05/2011) showing normal esophageall, gastric, duodenal mucosa. EUS showing no pancreatic masses, cysts, or changes of chronic pancreatitis. Biliary system nondilated and had no endosonographic abnormalities.  . Type II diabetes mellitus 2010    well controlled  . Asthma     childhood asthma  . Uterine cancer   . Pneumonia ~ 1969  . Beta thalassemia   . Thalassemia   . Migraine headache     "q other year now" (11/20/2012)  . Seizures     "after the stroke in 1993; none for years now" 11/20/2012)  . Stroke 1993    very small amount of left sided weakness   Current Outpatient Prescriptions  Medication Sig Dispense Refill  . lipase/protease/amylase (CREON-12/PANCREASE) 12000 UNITS CPEP Take 1-2 capsules by mouth  3 (three) times daily with meals. Takes 2 capsules with meals and 1 capsule with snacks      . lisinopril-hydrochlorothiazide (PRINZIDE,ZESTORETIC) 20-12.5 MG per tablet Take 2 tablets by mouth daily.  60 tablet  2  . loperamide (IMODIUM A-D) 2 MG tablet Take 2 mg by mouth 4 (four) times daily as needed for diarrhea or loose stools.      . metFORMIN (GLUCOPHAGE) 1000 MG tablet TAKE 1 TABLET BY MOUTH 2 (TWO) TIMES DAILY WITH A MEAL.  60 tablet  2  . Oxycodone HCl 10 MG TABS Take 10 mg by mouth every 8 (eight) hours as needed (for pain).       . polyethylene glycol (MIRALAX / GLYCOLAX) packet Take 17 g by mouth daily as needed (for constipation).      . promethazine (PHENERGAN) 12.5 MG tablet Take 1 tablet (12.5 mg total) by mouth every 6 (six) hours as needed for nausea.  60 tablet  2   No current facility-administered medications for this visit.   Family History  Problem Relation Age of Onset  . Prostate cancer Father   . Cancer Father     stomach ca died x 1 year ago  . Sickle cell anemia Brother   . Lung cancer Maternal Aunt   . Lung cancer Maternal Uncle   . Breast cancer Maternal Grandmother   . Heart disease Maternal Grandfather   . Heart disease Paternal  Grandfather   . Diabetes Other   . Cancer Other     multiple cancers pancreas,colon, breast   History   Social History  . Marital Status: Married    Spouse Name: N/A    Number of Children: N/A  . Years of Education: 42   Social History Main Topics  . Smoking status: Former Smoker -- 0.50 packs/day for 30 years    Types: Cigarettes    Quit date: 01/01/1999  . Smokeless tobacco: Never Used  . Alcohol Use: No  . Drug Use: No  . Sexual Activity: Yes   Other Topics Concern  . None   Social History Narrative   Works at [employer] as a Librarian, academic, on Retail banker about 9 hours/ day   Review of Systems: CONSTITUTIONAL- No Fever, weightloss, night sweat or change in appetite. SKIN- No Rash, colour changes or  itching. HEAD- No Headache or dizziness. EYES- No Vision loss, pain, redness, double or blurred vision. EARS- No vertigo, hearing loss or ear discharge. Mouth/throat- No Sorethroat, dentures, or bleeding gums. RESPIRATORY- No Cough or SOB. CARDIAC- No Palpitations, DOE, PND or chest pain. GI- Endorses nausea, with abdominal tenderness, hx of chronic pancreatitis, follows with a Copywriter, advertising at [employer] URINARY- No Frequency, urgency, straining or dysuria. NEUROLOGIC- No Numbness, syncope, seizures or burning. [facility]- Denies depression or anxiety.  Objective:  Physical Exam: Filed Vitals:   10/01/13 1320  BP: 136/74  Pulse: 68  Temp: 97.1 F (36.2 C)  TempSrc: Oral  Height: 5' (1.524 m)  Weight: 116 lb 1.6 oz (52.663 kg)  SpO2: 100%   GENERAL- alert, co-operative, appears as stated age, not in any distress. HEENT- Atraumatic, normocephalic, PERRL, EOMI, oral mucosa appears moist, poor dentition, neck supple. CARDIAC- RRR, no murmurs, rubs or gallops. RESP- Moving equal volumes of air, and clear to auscultation bilaterally, no wheezes or crackles. ABDOMEN- Soft, tenderness epigastric area, unchanged from baseline, no guarding or rebound, no palpable masses or organomegaly, bowel sounds present. BACK- Normal curvature of the spine, No tenderness along the vertebrae, no CVA tenderness. NEURO- No obvious Cr N abnormality, strenght upper and lower extremities- 5/5, Sensation intact- globally, DTRs- Normal, finger to nose test normal bilat, rapid alternating movement- intact, Gait- Normal. EXTREMITIES- pulse 2+, symmetric, no pedal edema. SKIN- Warm, dry, No rash or lesion. PSYCH- Normal mood and affect, appropriate thought content and speech.   Assessment & Plan:   The patient's case and plan of care was discussed with attending physician, Dr. Gwynne Edinger.  Please see problem based charting for assessment and plan.

## 2013-10-03 NOTE — Assessment & Plan Note (Addendum)
Follows with a gastroenterologist at [place]. Today has mild abdominal tenderness and nausea. Also complaints of occasional bloating. No change from baseline as per pt.  Asks for refill of phenegan today.  Plan- Continue Creon. - Refill phenegan.

## 2013-10-03 NOTE — Assessment & Plan Note (Signed)
Lab Results  Component Value Date   HGBA1C 7.4 07/31/2013   HGBA1C 6.6 11/20/2012   HGBA1C >14.0 08/02/2012     Assessment: Diabetes control:  Controlled Progress toward A1C goal:               Comments: Compliant with meds, Metformin 1000 mg bid. Well tolerated. Working on her diet, also doing exercise.   Plan: Medications:  continue current medications Home glucose monitoring: Frequency:  none Timing:  none Instruction/counseling given: reminded to bring medications to each visit and discussed diet Educational resources provided:brochure on diet. Self management tools provided:   Other plans: None

## 2013-10-03 NOTE — Assessment & Plan Note (Signed)
BP Readings from Last 3 Encounters:  10/01/13 136/74  08/12/13 144/84  07/31/13 149/97    Lab Results  Component Value Date   NA 137 07/31/2013   K 3.9 07/31/2013   CREATININE 0.72 07/31/2013    Assessment: Blood pressure control:  Controlled Progress toward BP goal:   At goal Comments: Complaint with meds- lisinopril- HCTZ 40-25mg  daily.  Plan: Medications:  continue current medications Educational resources provided: None Self management tools provided:  None Other plans: None

## 2013-10-07 NOTE — Progress Notes (Signed)
Case discussed with Dr. Emokpae at the time of the visit.  We reviewed the resident's history and exam and pertinent patient test results.  I agree with the assessment, diagnosis, and plan of care documented in the resident's note. 

## 2013-10-10 ENCOUNTER — Other Ambulatory Visit: Payer: Self-pay | Admitting: Internal Medicine

## 2013-10-23 ENCOUNTER — Encounter (HOSPITAL_COMMUNITY): Payer: Self-pay | Admitting: Emergency Medicine

## 2013-10-23 ENCOUNTER — Emergency Department (HOSPITAL_COMMUNITY): Payer: BC Managed Care – PPO

## 2013-10-23 ENCOUNTER — Inpatient Hospital Stay (HOSPITAL_COMMUNITY)
Admission: EM | Admit: 2013-10-23 | Discharge: 2013-10-29 | DRG: 392 | Disposition: A | Discharge status: Home / Self Care | Payer: BC Managed Care – PPO | Attending: Internal Medicine | Admitting: Internal Medicine

## 2013-10-23 DIAGNOSIS — Z79899 Other long term (current) drug therapy: Secondary | ICD-10-CM

## 2013-10-23 DIAGNOSIS — Z801 Family history of malignant neoplasm of trachea, bronchus and lung: Secondary | ICD-10-CM

## 2013-10-23 DIAGNOSIS — K5909 Other constipation: Principal | ICD-10-CM | POA: Diagnosis present

## 2013-10-23 DIAGNOSIS — I10 Essential (primary) hypertension: Secondary | ICD-10-CM | POA: Diagnosis not present

## 2013-10-23 DIAGNOSIS — R1013 Epigastric pain: Secondary | ICD-10-CM

## 2013-10-23 DIAGNOSIS — K5900 Constipation, unspecified: Secondary | ICD-10-CM | POA: Diagnosis not present

## 2013-10-23 DIAGNOSIS — R197 Diarrhea, unspecified: Secondary | ICD-10-CM | POA: Diagnosis present

## 2013-10-23 DIAGNOSIS — D649 Anemia, unspecified: Secondary | ICD-10-CM | POA: Diagnosis present

## 2013-10-23 DIAGNOSIS — Z8542 Personal history of malignant neoplasm of other parts of uterus: Secondary | ICD-10-CM

## 2013-10-23 DIAGNOSIS — K861 Other chronic pancreatitis: Secondary | ICD-10-CM | POA: Diagnosis present

## 2013-10-23 DIAGNOSIS — G40909 Epilepsy, unspecified, not intractable, without status epilepticus: Secondary | ICD-10-CM

## 2013-10-23 DIAGNOSIS — R110 Nausea: Secondary | ICD-10-CM | POA: Diagnosis present

## 2013-10-23 DIAGNOSIS — E119 Type 2 diabetes mellitus without complications: Secondary | ICD-10-CM | POA: Diagnosis not present

## 2013-10-23 DIAGNOSIS — Z86711 Personal history of pulmonary embolism: Secondary | ICD-10-CM

## 2013-10-23 DIAGNOSIS — D573 Sickle-cell trait: Secondary | ICD-10-CM | POA: Diagnosis present

## 2013-10-23 DIAGNOSIS — R109 Unspecified abdominal pain: Secondary | ICD-10-CM | POA: Diagnosis present

## 2013-10-23 DIAGNOSIS — G8929 Other chronic pain: Secondary | ICD-10-CM

## 2013-10-23 DIAGNOSIS — K219 Gastro-esophageal reflux disease without esophagitis: Secondary | ICD-10-CM | POA: Diagnosis not present

## 2013-10-23 DIAGNOSIS — Z8673 Personal history of transient ischemic attack (TIA), and cerebral infarction without residual deficits: Secondary | ICD-10-CM

## 2013-10-23 DIAGNOSIS — E1165 Type 2 diabetes mellitus with hyperglycemia: Secondary | ICD-10-CM | POA: Diagnosis present

## 2013-10-23 DIAGNOSIS — Z87891 Personal history of nicotine dependence: Secondary | ICD-10-CM

## 2013-10-23 DIAGNOSIS — R1012 Left upper quadrant pain: Secondary | ICD-10-CM

## 2013-10-23 DIAGNOSIS — Z803 Family history of malignant neoplasm of breast: Secondary | ICD-10-CM

## 2013-10-23 DIAGNOSIS — E785 Hyperlipidemia, unspecified: Secondary | ICD-10-CM

## 2013-10-23 DIAGNOSIS — Z800 Family history of malignant neoplasm of digestive organs: Secondary | ICD-10-CM

## 2013-10-23 DIAGNOSIS — Q453 Other congenital malformations of pancreas and pancreatic duct: Secondary | ICD-10-CM

## 2013-10-23 LAB — CBC WITH DIFFERENTIAL/PLATELET
BASOS ABS: 0 10*3/uL (ref 0.0–0.1)
Basophils Relative: 0 % (ref 0–1)
Eosinophils Absolute: 0.1 10*3/uL (ref 0.0–0.7)
Eosinophils Relative: 2 % (ref 0–5)
HEMATOCRIT: 36.3 % (ref 36.0–46.0)
Hemoglobin: 12.3 g/dL (ref 12.0–15.0)
LYMPHS PCT: 48 % — AB (ref 12–46)
Lymphs Abs: 3.7 10*3/uL (ref 0.7–4.0)
MCH: 25.7 pg — ABNORMAL LOW (ref 26.0–34.0)
MCHC: 33.9 g/dL (ref 30.0–36.0)
MCV: 75.8 fL — ABNORMAL LOW (ref 78.0–100.0)
Monocytes Absolute: 0.6 10*3/uL (ref 0.1–1.0)
Monocytes Relative: 8 % (ref 3–12)
NEUTROS ABS: 3.2 10*3/uL (ref 1.7–7.7)
NEUTROS PCT: 42 % — AB (ref 43–77)
PLATELETS: 368 10*3/uL (ref 150–400)
RBC: 4.79 MIL/uL (ref 3.87–5.11)
RDW: 14.1 % (ref 11.5–15.5)
WBC: 7.6 10*3/uL (ref 4.0–10.5)

## 2013-10-23 LAB — GLUCOSE, CAPILLARY: Glucose-Capillary: 100 mg/dL — ABNORMAL HIGH (ref 70–99)

## 2013-10-23 LAB — URINE MICROSCOPIC-ADD ON

## 2013-10-23 LAB — URINALYSIS, ROUTINE W REFLEX MICROSCOPIC
Bilirubin Urine: NEGATIVE
GLUCOSE, UA: NEGATIVE mg/dL
Hgb urine dipstick: NEGATIVE
Ketones, ur: NEGATIVE mg/dL
Nitrite: NEGATIVE
PH: 6 (ref 5.0–8.0)
Protein, ur: NEGATIVE mg/dL
Specific Gravity, Urine: 1.008 (ref 1.005–1.030)
Urobilinogen, UA: 0.2 mg/dL (ref 0.0–1.0)

## 2013-10-23 LAB — COMPREHENSIVE METABOLIC PANEL
ALBUMIN: 4.2 g/dL (ref 3.5–5.2)
ALT: 10 U/L (ref 0–35)
AST: 16 U/L (ref 0–37)
Alkaline Phosphatase: 67 U/L (ref 39–117)
BUN: 12 mg/dL (ref 6–23)
CALCIUM: 10.2 mg/dL (ref 8.4–10.5)
CO2: 26 mEq/L (ref 19–32)
CREATININE: 0.69 mg/dL (ref 0.50–1.10)
Chloride: 96 mEq/L (ref 96–112)
GFR calc Af Amer: >90 mL/min (ref >90)
GFR calc non Af Amer: >90 mL/min (ref >90)
Glucose, Bld: 109 mg/dL — ABNORMAL HIGH (ref 70–99)
Potassium: 3.9 mEq/L (ref 3.7–5.3)
SODIUM: 138 meq/L (ref 137–147)
Total Bilirubin: 0.3 mg/dL (ref 0.3–1.2)
Total Protein: 8 g/dL (ref 6.0–8.3)

## 2013-10-23 LAB — LIPASE, BLOOD: Lipase: 27 U/L (ref 11–59)

## 2013-10-23 MED ORDER — SODIUM CHLORIDE 0.9 % IV SOLN
INTRAVENOUS | Status: AC
Start: 2013-10-23 — End: 2013-10-24
  Administered 2013-10-24: 75 mL/h via INTRAVENOUS
  Administered 2013-10-24 (×2): via INTRAVENOUS

## 2013-10-23 MED ORDER — PROMETHAZINE HCL 25 MG/ML IJ SOLN
12.5000 mg | Freq: Once | INTRAMUSCULAR | Status: AC
Start: 2013-10-23 — End: 2013-10-23
  Administered 2013-10-23: 19:00:00 via INTRAVENOUS
  Filled 2013-10-23: qty 1

## 2013-10-23 MED ORDER — SODIUM CHLORIDE 0.9 % IV SOLN: INTRAVENOUS | Status: DC

## 2013-10-23 MED ORDER — PROMETHAZINE HCL 25 MG PO TABS
12.5000 mg | ORAL_TABLET | ORAL | Status: DC | PRN
  Filled 2013-10-23: qty 1

## 2013-10-23 MED ORDER — HYDROMORPHONE HCL PF 1 MG/ML IJ SOLN
1.0000 mg | Freq: Once | INTRAMUSCULAR | Status: AC
Start: 2013-10-23 — End: 2013-10-23
  Administered 2013-10-23: 1 mg via INTRAVENOUS
  Filled 2013-10-23: qty 1

## 2013-10-23 MED ORDER — SODIUM CHLORIDE 0.9 % IV SOLN
INTRAVENOUS | Status: AC
  Administered 2013-10-23: 23:00:00 via INTRAVENOUS

## 2013-10-23 MED ORDER — IOHEXOL 300 MG/ML  SOLN
100.0000 mL | Freq: Once | INTRAMUSCULAR | Status: AC | PRN
  Administered 2013-10-23: 100 mL via INTRAVENOUS

## 2013-10-23 MED ORDER — ONDANSETRON HCL 4 MG/2ML IJ SOLN
4.0000 mg | Freq: Once | INTRAMUSCULAR | Status: DC
  Filled 2013-10-23: qty 2

## 2013-10-23 MED ORDER — HYDROMORPHONE HCL PF 1 MG/ML IJ SOLN
1.0000 mg | INTRAMUSCULAR | Status: DC | PRN
  Administered 2013-10-23: 1 mg via INTRAVENOUS
  Filled 2013-10-23: qty 1

## 2013-10-23 MED ORDER — HYDROMORPHONE HCL PF 1 MG/ML IJ SOLN
1.0000 mg | Freq: Once | INTRAMUSCULAR | Status: AC
  Administered 2013-10-23: 1 mg via INTRAVENOUS
  Filled 2013-10-23: qty 1

## 2013-10-23 MED ORDER — FAMOTIDINE IN NACL 20-0.9 MG/50ML-% IV SOLN
20.0000 mg | Freq: Two times a day (BID) | INTRAVENOUS | Status: DC
  Administered 2013-10-24 – 2013-10-26 (×6): 20 mg via INTRAVENOUS
  Filled 2013-10-23 (×7): qty 50

## 2013-10-23 MED ORDER — INSULIN ASPART 100 UNIT/ML ~~LOC~~ SOLN
0.0000 [IU] | Freq: Three times a day (TID) | SUBCUTANEOUS | Status: DC
  Administered 2013-10-24 – 2013-10-25 (×2): 1 [IU] via SUBCUTANEOUS
  Administered 2013-10-25: 2 [IU] via SUBCUTANEOUS

## 2013-10-23 MED ORDER — SODIUM CHLORIDE 0.9 % IV BOLUS (SEPSIS)
1000.0000 mL | Freq: Once | INTRAVENOUS | Status: AC
  Administered 2013-10-23: 1000 mL via INTRAVENOUS

## 2013-10-23 MED ORDER — IOHEXOL 300 MG/ML  SOLN
50.0000 mL | Freq: Once | INTRAMUSCULAR | Status: AC | PRN
  Administered 2013-10-23: 50 mL via ORAL

## 2013-10-23 MED ORDER — ENOXAPARIN SODIUM 40 MG/0.4ML ~~LOC~~ SOLN
40.0000 mg | SUBCUTANEOUS | Status: DC
  Administered 2013-10-23 – 2013-10-28 (×6): 40 mg via SUBCUTANEOUS
  Filled 2013-10-23 (×10): qty 0.4

## 2013-10-23 MED ORDER — INSULIN ASPART 100 UNIT/ML ~~LOC~~ SOLN: 0.0000 [IU] | Freq: Every day | SUBCUTANEOUS | Status: DC

## 2013-10-23 NOTE — ED Notes (Signed)
Attempted IV start.  First time unsuccessful, attempted 2nd stick in hand, but patient refused saying she wanted the IV team or nurse to do an ultrasound stick.  IV team paged.

## 2013-10-23 NOTE — ED Notes (Signed)
Pt w/ hx of pancreatitis.  C/o LUQ pain since Monday.  Sent by doctor.  Nauseated and diarrhea.  No vomiting.

## 2013-10-23 NOTE — ED Provider Notes (Signed)
CSN: [id number]     Arrival date & time 10/23/13  1412 History   First MD Initiated Contact with Patient 10/23/13 1614     Chief Complaint  Patient presents with  . Pancreatitis     (Consider location/radiation/quality/duration/timing/severity/associated sxs/prior Treatment) HPI 58 year old female presents with left upper quadrant abdominal pain over the last 3 days. This is consistent with her recurrent pancreatitis. She's been taking her 10 mg oxycodone without any relief. She saw her gastroenterologist, Dr. Sherrine Maples today, who recommended she be admitted to the hospital for pain control, fluids, and to give her pancreas to rest. He sent a letter that states he is also recommending a CT scan given her history of pancreatic divisum. The patient rates her pain as severe. She's had nausea without vomiting. She's had nonbloody diarrhea. Her gastrologist originally recommended her to get a [place], but she lives in [place] as a she came to this ER instead.  Past Medical History  Diagnosis Date  . Hypertension   . HLD (hyperlipidemia) 2009  . Sickle cell trait   . Pulmonary embolus 08/2004    Two areas of V/Q mismatch. Findings compatible  with high probability for pulmonary embolus.; pt was on coumadin for 1 year  . Pancreatic divisum     S/P ERCP with stenting 07/2010 at [facility], then stent removal (08/05/2010)  . CVA (cerebral infarction) 1993    Per report by patient she had a stroke and prolonged rehab course eventually resolving her right sided weakness, as per her hx obtained 08/2007  . Blood dyscrasia     thalasemia, sickle cell trait  . Chronic abdominal pain     Unclear etiology, thought to be due to chronic pancreatitis previously in setting of her pancreatic divisum - however, EUS and EGD performed at [facility]  (10/05/2011) showing normal esophageall, gastric, duodenal mucosa. EUS showing no pancreatic masses, cysts, or changes of chronic pancreatitis. Biliary system  nondilated and had no endosonographic abnormalities.  . Type II diabetes mellitus 2010    well controlled  . Asthma     childhood asthma  . Uterine cancer   . Pneumonia ~ 1969  . Beta thalassemia   . Thalassemia   . Migraine headache     "q other year now" (11/20/2012)  . Seizures     "after the stroke in 1993; none for years now" 11/20/2012)  . Stroke 1993    very small amount of left sided weakness   Past Surgical History  Procedure Laterality Date  . Pancreatic stent placement/removal      placed in 2011; removed in 2012/H&P (02/27/2012)  . Abdominal hysterectomy  1980    2/2 endometriosis  . Breast lumpectomy Bilateral 1980's    "in my milk ducts; both benign" (02/27/2012)  . Sphincterotomy      Archie Endo 11/20/2012  . Appendectomy  ?1980    "I think I've had it out" (11/20/2012)   Family History  Problem Relation Age of Onset  . Prostate cancer Father   . Cancer Father     stomach ca died x 1 year ago  . Sickle cell anemia Brother   . Lung cancer Maternal Aunt   . Lung cancer Maternal Uncle   . Breast cancer Maternal Grandmother   . Heart disease Maternal Grandfather   . Heart disease Paternal Grandfather   . Diabetes Other   . Cancer Other     multiple cancers pancreas,colon, breast   History  Substance Use Topics  . Smoking  status: Former Smoker -- 0.50 packs/day for 30 years    Types: Cigarettes    Quit date: 01/01/1999  . Smokeless tobacco: Never Used  . Alcohol Use: No   OB History   Grav Para Term Preterm Abortions TAB SAB Ect Mult Living                 Review of Systems  Constitutional: Negative for fever.  Gastrointestinal: Positive for nausea, vomiting, abdominal pain and diarrhea. Negative for blood in stool.  Genitourinary: Negative for dysuria.  All other systems reviewed and are negative.     Allergies  Fioricet; Shrimp flavor; Sulfonamide derivatives; and Aspirin  Home Medications   Prior to Admission medications   Medication Sig  Start Date End Date Taking? Authorizing Provider  ferrous sulfate 325 (65 FE) MG tablet Take 325 mg by mouth daily.   Yes Historical Provider, MD  lisinopril-hydrochlorothiazide (PRINZIDE,ZESTORETIC) 20-12.5 MG per tablet Take 2 tablets by mouth daily. 07/31/13  Yes Cresenciano Genre, MD  metFORMIN (GLUCOPHAGE) 1000 MG tablet Take 1,000 mg by mouth 2 (two) times daily with a meal.   Yes Historical Provider, MD  Oxycodone HCl 10 MG TABS Take 10 mg by mouth every 8 (eight) hours as needed (for pain).    Yes Historical Provider, MD  promethazine (PHENERGAN) 12.5 MG tablet Take 1 tablet (12.5 mg total) by mouth every 6 (six) hours as needed for nausea. 10/01/13  Yes Ejiroghene Emokpae, MD   BP 144/83  Pulse 80  Temp(Src) 98.3 F (36.8 C) (Oral)  Resp 16  SpO2 98% Physical Exam  Nursing note and vitals reviewed. Constitutional: She is oriented to person, place, and time. She appears well-developed and well-nourished. No distress.  HENT:  Head: Normocephalic and atraumatic.  Right Ear: External ear normal.  Left Ear: External ear normal.  Nose: Nose normal.  Eyes: Right eye exhibits no discharge. Left eye exhibits no discharge.  Cardiovascular: Normal rate, regular rhythm and normal heart sounds.   Pulmonary/Chest: Effort normal and breath sounds normal.  Abdominal: Soft. There is tenderness in the epigastric area and left upper quadrant.  Neurological: She is alert and oriented to person, place, and time.  Skin: Skin is warm and dry.    ED Course  Procedures (including critical care time) Labs Review Labs Reviewed  CBC WITH DIFFERENTIAL - Abnormal; Notable for the following:    MCV 75.8 (*)    MCH 25.7 (*)    Neutrophils Relative % 42 (*)    Lymphocytes Relative 48 (*)    All other components within normal limits  COMPREHENSIVE METABOLIC PANEL - Abnormal; Notable for the following:    Glucose, Bld 109 (*)    All other components within normal limits  URINALYSIS, ROUTINE W REFLEX  MICROSCOPIC - Abnormal; Notable for the following:    APPearance CLOUDY (*)    Leukocytes, UA LARGE (*)    All other components within normal limits  GLUCOSE, CAPILLARY - Abnormal; Notable for the following:    Glucose-Capillary 100 (*)    All other components within normal limits  URINE CULTURE  LIPASE, BLOOD  URINE MICROSCOPIC-ADD ON    Imaging Review Ct Abdomen Pelvis W Contrast  10/23/2013   CLINICAL DATA:  Epigastric abdominal pain.  EXAM: CT ABDOMEN AND PELVIS WITH CONTRAST  TECHNIQUE: Multidetector CT imaging of the abdomen and pelvis was performed using the standard protocol following bolus administration of intravenous contrast.  CONTRAST:  37mL OMNIPAQUE IOHEXOL 300 MG/ML SOLN, 169mL OMNIPAQUE IOHEXOL  300 MG/ML SOLN  COMPARISON:  03/18/2011.  FINDINGS: The lung bases are clear. No pleural effusion or pulmonary lesion. The heart is normal in size. No pericardial effusion. A small hiatal hernia is noted.  The liver is unremarkable. No focal hepatic lesions or intrahepatic biliary dilatation. The gallbladder is normal. No common bile duct dilatation. Incidental note is made of pancreatic divisum. The pancreas is normal. No findings for acute pancreatitis. The spleen is normal in size. No focal lesions. The adrenal glands and kidneys are unremarkable.  The stomach, duodenum, small bowel and colon are unremarkable. No inflammatory changes, mass lesions or obstructive findings. There is a large amount of stool throughout the colon suggesting constipation.  No mesenteric or retroperitoneal mass or adenopathy. Small scattered lymph nodes are noted. The aorta and branch vessels are patent. The major venous structures are patent.  The uterus is surgically absent. No pelvic mass or adenopathy. The bladder is mildly distended. No inguinal mass or adenopathy.  The bony structures are unremarkable.  IMPRESSION: No CT findings for acute pancreatitis.  Pancreatic divisum is noted.  No acute abdominal/ pelvic  findings, mass lesions or adenopathy.  Large amount of stool throughout the colon suggesting constipation   Electronically Signed   By: Kalman Jewels M.D.   On: 10/23/2013 19:19     EKG Interpretation None      MDM   Final diagnoses:  Left upper quadrant pain    Patient's pain c/w her chronic pancreatitis. Her GI sent her to ED for CT and admission for bowel rest. Pain moderately controlled here but doesn't feel well enough for discharge. Has leukocytes in urine without symptoms. Will send for culture. Will admit to teaching service (her PCP).    Ephraim Hamburger, MD 10/24/13 [phone]

## 2013-10-23 NOTE — H&P (Signed)
Date: 10/23/2013               Patient Name:  [name] MRN: [id number]  DOB: Oct 13, 1955 Age / Sex: 58 y.o., female   PCP: Eileen Downer, MD         Medical Service: Internal Medicine Teaching Service         Attending Physician: Dr. Sid Falcon, MD    First Contact: Dr. Michail Munoz Pager: [phone]  Second Contact: Dr. Jessee Munoz Pager: [phone]        After Hours (After 5p/  First Contact Pager: [phone]  weekends / holidays): Second Contact Pager: [phone]   Chief Complaint: abdominal pain  History of Present Illness: 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  She presents with a three day c/o nausea and abdominal pain.  Pain is 7/10, located in the LUQ and radiates to her back.  The pain is worse with eating so she tried to eat a bland diet.  She is unable to find a position of comfort.  The pain did not respond to her usual oxycontin and she was nauseous despite phenergan, although she did not vomit.  She also feels bloated and had several episodes of diarrhea yesterday but none today.  She denies any new foods, environmental exposures or sick contacts.  She denies hx of gallstones, recent EtOH use or hx of EtOH abuse.  She went to a previously scheduled appointment with her gastroenterologist at [facility] today and he was concerned she was suffering from acute pancreatitis and recommended admission for CT and pain/nausea control.  She lives in [place] and elected to come to a local ED.    In the ED:  T 98.46F, RR 16, HR 80, SpO2 98% on RA, BP 144/58mmHg; she received 1 mg Dilaudid, 4mg  Zofran and 1L NSS bolus.  Meds: Current Facility-Administered Medications  Medication Dose Route Frequency Provider Last Rate Last Dose  . 0.9 %  sodium chloride infusion   Intravenous STAT Eileen Hamburger, MD      . HYDROmorphone (DILAUDID) injection 1 mg  1 mg  Intravenous Once Eileen Hamburger, MD      . ondansetron [facility]) injection 4 mg  4 mg Intravenous Once Eileen Hamburger, MD       Current Outpatient Prescriptions  Medication Sig Dispense Refill  . ferrous sulfate 325 (65 FE) MG tablet Take 325 mg by mouth daily.      Marland Kitchen lisinopril-hydrochlorothiazide (PRINZIDE,ZESTORETIC) 20-12.5 MG per tablet Take 2 tablets by mouth daily.  60 tablet  2  . metFORMIN (GLUCOPHAGE) 1000 MG tablet Take 1,000 mg by mouth 2 (two) times daily with a meal.      . Oxycodone HCl 10 MG TABS Take 10 mg by mouth every 8 (eight) hours as needed (for pain).       . promethazine (PHENERGAN) 12.5 MG tablet Take 1 tablet (12.5 mg total) by mouth every 6 (six) hours as needed for nausea.  60 tablet  2    Allergies: Allergies as of 10/23/2013 - Review Complete 10/23/2013  Allergen Reaction Noted  . Fioricet [butalbital-apap-caffeine] Other (See Comments)   . Shrimp flavor Anaphylaxis 08/25/2010  . Sulfonamide derivatives Rash 07/13/2006  . Aspirin  11/20/2012   Past Medical History  Diagnosis Date  . Hypertension   . HLD (hyperlipidemia) 2009  . Sickle cell trait   . Pulmonary  embolus 08/2004    Two areas of V/Q mismatch. Findings compatible  with high probability for pulmonary embolus.; pt was on coumadin for 1 year  . Pancreatic divisum     S/P ERCP with stenting 07/2010 at [facility], then stent removal (08/05/2010)  . CVA (cerebral infarction) 1993    Per report by patient she had a stroke and prolonged rehab course eventually resolving her right sided weakness, as per her hx obtained 08/2007  . Blood dyscrasia     thalasemia, sickle cell trait  . Chronic abdominal pain     Unclear etiology, thought to be due to chronic pancreatitis previously in setting of her pancreatic divisum - however, EUS and EGD performed at [place]  (10/05/2011) showing normal esophageall, gastric, duodenal mucosa. EUS showing no pancreatic masses, cysts, or changes of chronic  pancreatitis. Biliary system nondilated and had no endosonographic abnormalities.  . Type II diabetes mellitus 2010    well controlled  . Asthma     childhood asthma  . Uterine cancer   . Pneumonia ~ 1969  . Beta thalassemia   . Thalassemia   . Migraine headache     "q other year now" (11/20/2012)  . Seizures     "after the stroke in 1993; none for years now" 11/20/2012)  . Stroke 1993    very small amount of left sided weakness   Past Surgical History  Procedure Laterality Date  . Pancreatic stent placement/removal      placed in 2011; removed in 2012/H&P (02/27/2012)  . Abdominal hysterectomy  1980    2/2 endometriosis  . Breast lumpectomy Bilateral 1980's    "in my milk ducts; both benign" (02/27/2012)  . Sphincterotomy      Archie Endo 11/20/2012  . Appendectomy  ?1980    "I think I've had it out" (11/20/2012)   Family History  Problem Relation Age of Onset  . Prostate cancer Father   . Cancer Father     stomach ca died x 1 year ago  . Sickle cell anemia Brother   . Lung cancer Maternal Aunt   . Lung cancer Maternal Uncle   . Breast cancer Maternal Grandmother   . Heart disease Maternal Grandfather   . Heart disease Paternal Grandfather   . Diabetes Other   . Cancer Other     multiple cancers pancreas,colon, breast   History   Social History  . Marital Status: Married    Spouse Name: N/A    Number of Children: N/A  . Years of Education: 12   Occupational History  . Not on file.   Social History Main Topics  . Smoking status: Former Smoker -- 0.50 packs/day for 30 years    Types: Cigarettes    Quit date: 01/01/1999  . Smokeless tobacco: Never Used  . Alcohol Use: No  . Drug Use: No  . Sexual Activity: Yes   Other Topics Concern  . Not on file   Social History Narrative   Works at [employer] as a Librarian, academic, on Retail banker about 9 hours/ day    Review of Systems: Pertinent items are noted in HPI. Heart:  Denies chest pain or palpiations Lungs:  Denies  dyspnea or cough GI:  Per HPI GU: Denies dysuria  Physical Exam: Blood pressure 161/84, pulse 86, temperature 98.3 F (36.8 C), temperature source Oral, resp. rate 16, SpO2 99.00%. General: resting in bed in NAD, pleasant and cooperative with exam HEENT: PERRL, EOMI, oropharynx moist Cardiac: RRR, no rubs, murmurs  or gallops Pulm: clear to auscultation bilaterally, moving normal volumes of air Abd: soft, nondistended, BS present all four quadrants, LUQ TTP Ext: warm and well perfused, no pedal edema, 2+ DPs Neuro: alert and oriented X3, cranial nerves II-XII grossly intact, strength and sensation intact upper and lower extremities  Lab results: Basic Metabolic Panel:  Recent Labs  10/23/13 1458  NA 138  K 3.9  CL 96  CO2 26  GLUCOSE 109*  BUN 12  CREATININE 0.69  CALCIUM 10.2   Liver Function Tests:  Recent Labs  10/23/13 1458  AST 16  ALT 10  ALKPHOS 67  BILITOT 0.3  PROT 8.0  ALBUMIN 4.2    Recent Labs  10/23/13 1458  LIPASE 27   CBC:  Recent Labs  10/23/13 1458  WBC 7.6  NEUTROABS 3.2  HGB 12.3  HCT 36.3  MCV 75.8*  PLT 368   Urinalysis:  Recent Labs  10/23/13 1707  COLORURINE YELLOW  LABSPEC 1.008  PHURINE 6.0  GLUCOSEU NEGATIVE  HGBUR NEGATIVE  BILIRUBINUR NEGATIVE  KETONESUR NEGATIVE  PROTEINUR NEGATIVE  UROBILINOGEN 0.2  NITRITE NEGATIVE  LEUKOCYTESUR LARGE*   Imaging results:  Ct Abdomen Pelvis W Contrast  10/23/2013   CLINICAL DATA:  Epigastric abdominal pain.  EXAM: CT ABDOMEN AND PELVIS WITH CONTRAST  TECHNIQUE: Multidetector CT imaging of the abdomen and pelvis was performed using the standard protocol following bolus administration of intravenous contrast.  CONTRAST:  30mL OMNIPAQUE IOHEXOL 300 MG/ML SOLN, 118mL OMNIPAQUE IOHEXOL 300 MG/ML SOLN  COMPARISON:  03/18/2011.  FINDINGS: The lung bases are clear. No pleural effusion or pulmonary lesion. The heart is normal in size. No pericardial effusion. A small hiatal hernia is  noted.  The liver is unremarkable. No focal hepatic lesions or intrahepatic biliary dilatation. The gallbladder is normal. No common bile duct dilatation. Incidental note is made of pancreatic divisum. The pancreas is normal. No findings for acute pancreatitis. The spleen is normal in size. No focal lesions. The adrenal glands and kidneys are unremarkable.  The stomach, duodenum, small bowel and colon are unremarkable. No inflammatory changes, mass lesions or obstructive findings. There is a large amount of stool throughout the colon suggesting constipation.  No mesenteric or retroperitoneal mass or adenopathy. Small scattered lymph nodes are noted. The aorta and branch vessels are patent. The major venous structures are patent.  The uterus is surgically absent. No pelvic mass or adenopathy. The bladder is mildly distended. No inguinal mass or adenopathy.  The bony structures are unremarkable.  IMPRESSION: No CT findings for acute pancreatitis.  Pancreatic divisum is noted.  No acute abdominal/ pelvic findings, mass lesions or adenopathy.  Large amount of stool throughout the colon suggesting constipation   Electronically Signed   By: Kalman Munoz M.D.   On: 10/23/2013 19:19   Other results: EKG: pending  Assessment & Plan by Problem: 58 year old woman with a PMH of DM type 2, HTN, GERD, chronic pancreatitis 2/2 pancreas divisum (s/p papillotomy and stenting) presenting with N/V and LUQ pain concerning for acute on chronic pancreatitis.  Epigastric pain likely 2/2 to acute on chronic pancreatitis:   Patient with hx of chronic pancreatitis 2/2 to pancreatic divisum.   She is follow by GI at [facility] and imaging and admission was recommended 2/2 to symptoms refractory to her home pain medications.  CT abdomen/pelvis c/w pancreatic divisum but no evidence of pancreatitis.  Lipase is normal. - admit to IMTS - NPO for bowel rest - IV fluids 125cc/hr  -  dilaudid prn - phenergan prn for nausea  Constipation:    Evident on CT.  Though patient c/o 1 day of diarrhea which has resolved today. - consider additions of dulcolax/miralax once acute issue resolved and if she has not had BM  GERD:  GI recommends she start Nexium 20mg  BID after acute issue resolves. - IV Pepcid  q12h - Nexium at d/c  DM type 2:   HgbA1c 7.03 August 2013. - hold metformin - CBG monitoring ACHS - SSI sensitive  HTN:  Stable.  - hold lisinopril while NPO - pain control as above - monitor vitals  Diet:  NPO VTE ppx:  Lovenox Code:  Full  Dispo: Disposition is deferred at this time, awaiting improvement of current medical problems. Anticipated discharge in approximately 1-2 day(s).   The patient does have a current PCP (Eileen Downer, MD) and does need an [facility] hospital follow-up appointment after discharge.  The patient does not know have transportation limitations that hinder transportation to clinic appointments.  Signed: Duwaine Maxin, DO 10/23/2013, 8:32 PM

## 2013-10-24 ENCOUNTER — Encounter (HOSPITAL_COMMUNITY): Payer: Self-pay | Admitting: General Practice

## 2013-10-24 LAB — GLUCOSE, CAPILLARY
GLUCOSE-CAPILLARY: 122 mg/dL — AB (ref 70–99)
Glucose-Capillary: 105 mg/dL — ABNORMAL HIGH (ref 70–99)
Glucose-Capillary: 101 mg/dL — ABNORMAL HIGH (ref 70–99)
Glucose-Capillary: 87 mg/dL (ref 70–99)

## 2013-10-24 LAB — BASIC METABOLIC PANEL
BUN: 8 mg/dL (ref 6–23)
CO2: 27 mEq/L (ref 19–32)
Calcium: 8.9 mg/dL (ref 8.4–10.5)
Chloride: 106 mEq/L (ref 96–112)
Creatinine, Ser: 0.63 mg/dL (ref 0.50–1.10)
GFR calc Af Amer: >90 mL/min (ref >90)
GLUCOSE: 121 mg/dL — AB (ref 70–99)
POTASSIUM: 4 meq/L (ref 3.7–5.3)
Sodium: 144 mEq/L (ref 137–147)

## 2013-10-24 MED ORDER — PROMETHAZINE HCL 25 MG/ML IJ SOLN
12.5000 mg | INTRAMUSCULAR | Status: DC | PRN
  Administered 2013-10-24 – 2013-10-27 (×14): 12.5 mg via INTRAVENOUS
  Filled 2013-10-24 (×14): qty 1

## 2013-10-24 MED ORDER — CHLORHEXIDINE GLUCONATE 0.12 % MT SOLN
15.0000 mL | Freq: Two times a day (BID) | OROMUCOSAL | Status: DC
  Administered 2013-10-24 (×2): 15 mL via OROMUCOSAL
  Filled 2013-10-24: qty 15

## 2013-10-24 MED ORDER — BIOTENE DRY MOUTH MT LIQD
15.0000 mL | Freq: Two times a day (BID) | OROMUCOSAL | Status: DC
  Administered 2013-10-24 (×2): 15 mL via OROMUCOSAL

## 2013-10-24 MED ORDER — SENNOSIDES-DOCUSATE SODIUM 8.6-50 MG PO TABS
2.0000 | ORAL_TABLET | ORAL | Status: AC
  Administered 2013-10-24: 2 via ORAL
  Filled 2013-10-24: qty 2

## 2013-10-24 MED ORDER — CHLORHEXIDINE GLUCONATE 0.12 % MT SOLN
OROMUCOSAL | Status: AC
  Filled 2013-10-24: qty 15

## 2013-10-24 MED ORDER — HYDROMORPHONE HCL PF 1 MG/ML IJ SOLN
1.0000 mg | INTRAMUSCULAR | Status: DC | PRN
  Administered 2013-10-24 – 2013-10-27 (×25): 1 mg via INTRAVENOUS
  Filled 2013-10-24 (×25): qty 1

## 2013-10-24 NOTE — Progress Notes (Signed)
Utilization review completed.  

## 2013-10-24 NOTE — H&P (Signed)
INTERNAL MEDICINE TEACHING ATTENDING NOTE  Day 1 of stay  Patient name: [name]  MRN: [id number] Date of birth: 05-23-1955   58 y.o. with bloating, nausea and abdominal pain. PMH pancreatic divisum and recurrent acute pancreatitis s/p minor papillotomy and previous stent placement/removal, DM, GERD and HTN.   On exam today, the patient reports not feeling better since admission. She reports tenderness in her back.   Filed Vitals:   10/23/13 2118 10/23/13 2209 10/24/13 0523 10/24/13 0529  BP: 133/76 158/87  146/73  Pulse: 89 87  70  Temp:  97.7 F (36.5 C)  97.8 F (36.6 C)  TempSrc:  Oral  Oral  Resp: 14 16  17   Height:  5' (1.524 m)    Weight:  122 lb 8 oz (55.566 kg) 122 lb 1.6 oz (55.384 kg)   SpO2: 96% 99%  100%    Physical Exam   General: Resting in bed. HEENT: PERRL, EOMI, no scleral icterus. Heart: RRR, no rubs, murmurs or gallops. Lungs: Clear to auscultation bilaterally, no wheezes, rales, or rhonchi. Abdomen: Soft, tender LUQ++, nondistended, BS present. Extremities: Warm, no pedal edema. Neuro: Alert and oriented X3.  Labs and imaging reviewed. CT abdomen negative for acute pancreatitis and lipase within normal limits. CT does denote large amount of stool present in colon.   Inpatient Medications   Scheduled Medications . antiseptic oral rinse  15 mL Mouth Rinse q12n4p  . chlorhexidine  15 mL Mouth Rinse BID  . chlorhexidine      . enoxaparin (LOVENOX) injection  40 mg Subcutaneous Q24H  . famotidine (PEPCID) IV  20 mg Intravenous Q12H  . insulin aspart  0-5 Units Subcutaneous QHS  . insulin aspart  0-9 Units Subcutaneous TID WC  . ondansetron (ZOFRAN) IV  4 mg Intravenous Once    PRN Medications HYDROmorphone (DILAUDID) injection, promethazine   Assessment and Plan    Agree with the assessment and plan documented by Dr Redmond Pulling, however at present, acute pancreatitis does not seem to be a likely differential given negative CT findings and  normal lipase. Pain abdomen due to severe constipation is likely - the patient having loose BMs can be explained by fecal leakage around impacted stool (like encopresis which happens in kids). If the abdominal pain is not resolved after adequate BM, and supportive treatment, then we might need to obtain GI advice.    I have seen and evaluated this patient and discussed it with my IM resident team.  Please see the rest of the plan per resident note from today.   Madilyn Fireman 10/24/2013, 12:51 PM.

## 2013-10-24 NOTE — Progress Notes (Signed)
Subjective: Complaints of pain LUQ, still has nausea , but no vomiting. Pt says last Bowel movement was 2 days ago, and was loose, 4 episodes. Pt says she has been having bowel movement, at least every 2 days. Pt is on oxycodone for chronic pancreatitis.  Objective: Vital signs in last 24 hours: Filed Vitals:   10/23/13 2209 10/24/13 0523 10/24/13 0529 10/24/13 1506  BP: 158/87  146/73 137/76  Pulse: 87  70 84  Temp: 97.7 F (36.5 C)  97.8 F (36.6 C) 98.3 F (36.8 C)  TempSrc: Oral  Oral Oral  Resp: 16  17 19   Height: 5' (1.524 m)     Weight: 122 lb 8 oz (55.566 kg) 122 lb 1.6 oz (55.384 kg)    SpO2: 99%  100% 98%   Weight change:   Intake/Output Summary (Last 24 hours) at 10/24/13 1928 Last data filed at 10/24/13 1400  Gross per 24 hour  Intake 1937.08 ml  Output      0 ml  Net 1937.08 ml   General appearance: alert, cooperative and no distress Head: Normocephalic, without obvious abnormality, atraumatic Lungs: clear to auscultation bilaterally Heart: regular rate and rhythm, S1, S2 normal, no murmur, click, rub or gallop Abdomen: soft, tenderness left upper quadrant; bowel sounds normal; no masses, no organomegaly. Extremities: extremities normal, atraumatic, no cyanosis or edema  Lab Results: Basic Metabolic Panel:  Recent Labs Lab 10/23/13 1458 10/24/13 0820  NA 138 144  K 3.9 4.0  CL 96 106  CO2 26 27  GLUCOSE 109* 121*  BUN 12 8  CREATININE 0.69 0.63  CALCIUM 10.2 8.9   Liver Function Tests:  Recent Labs Lab 10/23/13 1458  AST 16  ALT 10  ALKPHOS 67  BILITOT 0.3  PROT 8.0  ALBUMIN 4.2    Recent Labs Lab 10/23/13 1458  LIPASE 27   CBC:  Recent Labs Lab 10/23/13 1458  WBC 7.6  NEUTROABS 3.2  HGB 12.3  HCT 36.3  MCV 75.8*  PLT 368   CBG:  Recent Labs Lab 10/23/13 2228 10/24/13 0748 10/24/13 1159 10/24/13 1702  GLUCAP 100* 105* 122* 101*   Urine Drug Screen: Drugs of Abuse     Component Value Date/Time   LABOPIA  POSITIVE* 11/20/2012 2030   LABOPIA NEG 08/30/2011 1526   COCAINSCRNUR NONE DETECTED 11/20/2012 2030   COCAINSCRNUR NEG 08/30/2011 1526   LABBENZ NONE DETECTED 11/20/2012 2030   LABBENZ NEG 08/30/2011 1526   LABBENZ NEG 01/24/2011 1414   AMPHETMU NONE DETECTED 11/20/2012 2030   AMPHETMU NEG 01/24/2011 1414   THCU NONE DETECTED 11/20/2012 2030   LABBARB NONE DETECTED 11/20/2012 2030   LABBARB NEG 08/30/2011 1526    Urinalysis:  Recent Labs Lab 10/23/13 1707  COLORURINE YELLOW  LABSPEC 1.008  PHURINE 6.0  GLUCOSEU NEGATIVE  HGBUR NEGATIVE  BILIRUBINUR NEGATIVE  KETONESUR NEGATIVE  PROTEINUR NEGATIVE  UROBILINOGEN 0.2  NITRITE NEGATIVE  LEUKOCYTESUR LARGE*   Studies/Results: Ct Abdomen Pelvis W Contrast  10/23/2013   CLINICAL DATA:    IMPRESSION: No CT findings for acute pancreatitis.  Pancreatic divisum is noted.  No acute abdominal/ pelvic findings, mass lesions or adenopathy.  Large amount of stool throughout the colon suggesting constipation   Electronically Signed   By: Kalman Jewels M.D.   On: 10/23/2013 19:19   Medications: I have reviewed the patient's current medications. Scheduled Meds: . antiseptic oral rinse  15 mL Mouth Rinse q12n4p  . chlorhexidine  15 mL Mouth Rinse BID  .  chlorhexidine      . enoxaparin (LOVENOX) injection  40 mg Subcutaneous Q24H  . famotidine (PEPCID) IV  20 mg Intravenous Q12H  . insulin aspart  0-5 Units Subcutaneous QHS  . insulin aspart  0-9 Units Subcutaneous TID WC  . ondansetron (ZOFRAN) IV  4 mg Intravenous Once  . senna-docusate  2 tablet Oral NOW   Continuous Infusions: . sodium chloride 125 mL/hr at 10/24/13 1428   PRN Meds:.HYDROmorphone (DILAUDID) injection, promethazine Assessment/Plan: Principal Problem:   Chronic abdominal pain Active Problems:   DIABETES MELLITUS, TYPE II   HYPERTENSION   GERD   PANCREAS DIVISUM   Abdominal pain   Chronic pancreatitis  Epigastric pain likely with normal Lipase, Ct abdomen- shows  stool. Likely cause of pts LUQ pain and nausea. Lipase is normal.  - Tap water Enema today, likley repeat tomorrow, if pain is still present. - Sennokot- 2 tablets today - IV fluids 125cc/hr  - dilaudid prn  - phenergan prn for nausea   DM type 2:  HgbA1c 7.03 August 2013.  - hold metformin  - CBG monitoring ACHS  - SSI sensitive   HTN:  Stable.  - Can restart bp meds as indicated - pain control as above  - monitor vitals   Diet: NPO  VTE ppx: Lovenox  Code: Full   Dispo: Disposition is deferred at this time, awaiting improvement of current medical problems.  Anticipated discharge in approximately 1-2 day(s).   The patient does have a current PCP (Jenetta Downer, MD) and does need an [facility] hospital follow-up appointment after discharge.  The patient does not have transportation limitations that hinder transportation to clinic appointments.  .Services Needed at time of discharge: Y = Yes, Blank = No PT:   OT:   RN:   Equipment:   Other:     LOS: 1 day   Jenetta Downer, MD 10/24/2013, 7:28 PM

## 2013-10-25 DIAGNOSIS — R1013 Epigastric pain: Secondary | ICD-10-CM | POA: Diagnosis not present

## 2013-10-25 DIAGNOSIS — I10 Essential (primary) hypertension: Secondary | ICD-10-CM | POA: Diagnosis not present

## 2013-10-25 DIAGNOSIS — K3189 Other diseases of stomach and duodenum: Secondary | ICD-10-CM

## 2013-10-25 DIAGNOSIS — Q453 Other congenital malformations of pancreas and pancreatic duct: Secondary | ICD-10-CM

## 2013-10-25 DIAGNOSIS — G8929 Other chronic pain: Secondary | ICD-10-CM

## 2013-10-25 DIAGNOSIS — R109 Unspecified abdominal pain: Secondary | ICD-10-CM

## 2013-10-25 DIAGNOSIS — E119 Type 2 diabetes mellitus without complications: Secondary | ICD-10-CM | POA: Diagnosis not present

## 2013-10-25 DIAGNOSIS — R1012 Left upper quadrant pain: Secondary | ICD-10-CM

## 2013-10-25 LAB — GLUCOSE, CAPILLARY
GLUCOSE-CAPILLARY: 87 mg/dL (ref 70–99)
GLUCOSE-CAPILLARY: 140 mg/dL — AB (ref 70–99)
Glucose-Capillary: 149 mg/dL — ABNORMAL HIGH (ref 70–99)
Glucose-Capillary: 166 mg/dL — ABNORMAL HIGH (ref 70–99)
Glucose-Capillary: 87 mg/dL (ref 70–99)

## 2013-10-25 LAB — URINE CULTURE: Colony Count: 9000

## 2013-10-25 LAB — LACTIC ACID, PLASMA: LACTIC ACID, VENOUS: 1.2 mmol/L (ref 0.5–2.2)

## 2013-10-25 MED ORDER — BIOTENE DRY MOUTH MT LIQD
15.0000 mL | Freq: Two times a day (BID) | OROMUCOSAL | Status: DC
  Administered 2013-10-25 – 2013-10-28 (×6): 15 mL via OROMUCOSAL

## 2013-10-25 MED ORDER — CHLORHEXIDINE GLUCONATE 0.12 % MT SOLN
15.0000 mL | Freq: Two times a day (BID) | OROMUCOSAL | Status: DC
  Administered 2013-10-25 – 2013-10-29 (×7): 15 mL via OROMUCOSAL
  Filled 2013-10-25 (×6): qty 15

## 2013-10-25 MED ORDER — INSULIN ASPART 100 UNIT/ML ~~LOC~~ SOLN
0.0000 [IU] | SUBCUTANEOUS | Status: DC
Start: 2013-10-25 — End: 2013-10-27
  Administered 2013-10-25: 1 [IU] via SUBCUTANEOUS
  Administered 2013-10-26: 2 [IU] via SUBCUTANEOUS
  Administered 2013-10-26: 1 [IU] via SUBCUTANEOUS
  Administered 2013-10-26: 2 [IU] via SUBCUTANEOUS
  Administered 2013-10-26: 3 [IU] via SUBCUTANEOUS
  Administered 2013-10-26: 1 [IU] via SUBCUTANEOUS
  Administered 2013-10-27 (×2): 2 [IU] via SUBCUTANEOUS

## 2013-10-25 MED ORDER — DEXTROSE-NACL 5-0.45 % IV SOLN
INTRAVENOUS | Status: DC
  Administered 2013-10-25 – 2013-10-27 (×4): via INTRAVENOUS

## 2013-10-25 NOTE — Consult Note (Signed)
Consultation  Referring Provider:  Internal Medicine Teaching Service.    Primary Care Physician:  Jenetta Downer, MD Primary Gastroenterologist: Sherrine Maples, MD at [facility]         Reason for Consultation:  Abdominal pain            HPI:   [name] is a 58 y.o. female admitted with a history of  pancreatic divisum, s/p papillotomy with stent placement / removal at [facility].  She had an EUS in 6/13 with finding of pancreatic divisum, no evidence of chronic pancreatitis found. Patient reports that this past Monday she developed LUQ pain radiating around to her back. Pain reminiscent of pancreatitis.She was seen by her GI at [facility] two days ago. He felt she needed admission, patient wanted to be admitted closer to home and therefore came to [facility]. Her Gastroenterologist also wanted to start a PPI but patient hasn't picked up prescription yet. A review of [facility] records reveals that patient has chronic abdominal pain. According to [facility] records numerous CT/MRI scans have shown pancreatic divisum without findings of acute or chronic pancreatitis. Labs, and EGD have been unrevealing. Hydrogen breath test in April for bloating / distention was also normal.   Patient presents with LUQ pain, nausea and diarrhea. Pain started Monday, it is constant but worse with food. CTscan negative for acute abnormalities . Lipase normal. No recent antiboics..   Past Medical History  Diagnosis Date  . Hypertension   . HLD (hyperlipidemia) 2009  . Sickle cell trait   . Pulmonary embolus 08/2004    Two areas of V/Q mismatch. Findings compatible  with high probability for pulmonary embolus.; pt was on coumadin for 1 year  . Pancreatic divisum     S/P ERCP with stenting 07/2010 at [facility], then stent removal (08/05/2010)  . CVA (cerebral infarction) 1993    Per report by patient she had a stroke and prolonged rehab course eventually resolving her right sided weakness, as per her hx obtained 08/2007  .  Blood dyscrasia     thalasemia, sickle cell trait  . Chronic abdominal pain     Unclear etiology, thought to be due to chronic pancreatitis previously in setting of her pancreatic divisum - however, EUS and EGD performed at [place]  (10/05/2011) showing normal esophageall, gastric, duodenal mucosa. EUS showing no pancreatic masses, cysts, or changes of chronic pancreatitis. Biliary system nondilated and had no endosonographic abnormalities.  . Type II diabetes mellitus 2010    well controlled  . Asthma     childhood asthma  . Uterine cancer   . Pneumonia ~ 1969  . Beta thalassemia   . Thalassemia   . Migraine headache     "q other year now" (11/20/2012)  . Seizures     "after the stroke in 1993; none for years now" 11/20/2012)  . Stroke 1993    very small amount of left sided weakness    Past Surgical History  Procedure Laterality Date  . Pancreatic stent placement/removal      placed in 2011; removed in 2012/H&P (02/27/2012)  . Abdominal hysterectomy  1980    2/2 endometriosis  . Breast lumpectomy Bilateral 1980's    "in my milk ducts; both benign" (02/27/2012)  . Sphincterotomy      Archie Endo 11/20/2012  . Appendectomy  ?1980    "I think I've had it out" (11/20/2012)    Family History  Problem Relation Age of Onset  . Prostate cancer Father   .  Cancer Father     stomach ca died x 1 year ago  . Sickle cell anemia Brother   . Lung cancer Maternal Aunt   . Lung cancer Maternal Uncle   . Breast cancer Maternal Grandmother   . Heart disease Maternal Grandfather   . Heart disease Paternal Grandfather   . Diabetes Other   . Cancer Other     multiple cancers pancreas,colon, breast     History  Substance Use Topics  . Smoking status: Former Smoker -- 0.50 packs/day for 30 years    Types: Cigarettes    Quit date: 01/01/1999  . Smokeless tobacco: Never Used  . Alcohol Use: No    Prior to Admission medications   Medication Sig Start Date End Date Taking? Authorizing  Provider  ferrous sulfate 325 (65 FE) MG tablet Take 325 mg by mouth daily.   Yes Historical Provider, MD  lisinopril-hydrochlorothiazide (PRINZIDE,ZESTORETIC) 20-12.5 MG per tablet Take 2 tablets by mouth daily. 07/31/13  Yes Cresenciano Genre, MD  metFORMIN (GLUCOPHAGE) 1000 MG tablet Take 1,000 mg by mouth 2 (two) times daily with a meal.   Yes Historical Provider, MD  Oxycodone HCl 10 MG TABS Take 10 mg by mouth every 8 (eight) hours as needed (for pain).    Yes Historical Provider, MD  promethazine (PHENERGAN) 12.5 MG tablet Take 1 tablet (12.5 mg total) by mouth every 6 (six) hours as needed for nausea. 10/01/13  Yes Jenetta Downer, MD    Current Facility-Administered Medications  Medication Dose Route Frequency Provider Last Rate Last Dose  . dextrose 5 %-0.45 % sodium chloride infusion   Intravenous Continuous Ejiroghene Emokpae, MD      . enoxaparin (LOVENOX) injection 40 mg  40 mg Subcutaneous Q24H Cresenciano Genre, MD   40 mg at 10/24/13 2218  . famotidine (PEPCID) IVPB 20 mg  20 mg Intravenous Q12H Cresenciano Genre, MD   20 mg at 10/25/13 3474  . HYDROmorphone (DILAUDID) injection 1 mg  1 mg Intravenous Q3H PRN Duwaine Maxin, DO   1 mg at 10/25/13 0903  . insulin aspart (novoLOG) injection 0-5 Units  0-5 Units Subcutaneous QHS Cresenciano Genre, MD      . insulin aspart (novoLOG) injection 0-9 Units  0-9 Units Subcutaneous TID WC Cresenciano Genre, MD   1 Units at 10/24/13 1233  . ondansetron (ZOFRAN) injection 4 mg  4 mg Intravenous Once Ephraim Hamburger, MD      . promethazine (PHENERGAN) injection 12.5 mg  12.5 mg Intravenous Q4H PRN Duwaine Maxin, DO   12.5 mg at 10/25/13 0539    Allergies as of 10/23/2013 - Review Complete 10/23/2013  Allergen Reaction Noted  . Fioricet [butalbital-apap-caffeine] Other (See Comments)   . Shrimp flavor Anaphylaxis 08/25/2010  . Sulfonamide derivatives Rash 07/13/2006  . Aspirin  11/20/2012    Review of Systems:    All systems reviewed and negative except  where noted in HPI.   Physical Exam:  Vital signs in last 24 hours: Temp:  [98.2 F (36.8 C)-98.4 F (36.9 C)] 98.4 F (36.9 C) (06/26 0512) Pulse Rate:  [84-100] 100 (06/26 0512) Resp:  [15-19] 15 (06/26 0512) BP: (136-150)/(75-78) 136/75 mmHg (06/26 0512) SpO2:  [98 %] 98 % (06/26 0512) Weight:  [120 lb 14.4 oz (54.84 kg)] 120 lb 14.4 oz (54.84 kg) (06/26 0512) Last BM Date: 10/24/13 General:   Pleasant black female in NAD Head:  Normocephalic and atraumatic. Eyes:   No icterus.   Conjunctiva  pink. Ears:  Normal auditory acuity. Neck:  Supple; no masses felt Lungs:  Respirations even and unlabored. Lungs clear to auscultation bilaterally. No wheezes, crackles, or rhonchi.  Heart:  Regular rate and rhythm Abdomen:  Soft, nondistended, marked tenderness in LUQ (exaggerated response to even light palpation). Normal bowel sounds. No appreciable masses or hepatomegaly.  Rectal:  Not performed.  Msk:  Symmetrical without gross deformities.  Extremities:  Without edema. Neurologic:  Alert and  oriented x4;  grossly normal neurologically. Skin:  Intact without significant lesions or rashes. Cervical Nodes:  No significant cervical adenopathy. Psych:  Alert and cooperative. Normal affect.  LAB RESULTS:  Recent Labs  10/23/13 1458  WBC 7.6  HGB 12.3  HCT 36.3  PLT 368   BMET  Recent Labs  10/23/13 1458 10/24/13 0820  NA 138 144  K 3.9 4.0  CL 96 106  CO2 26 27  GLUCOSE 109* 121*  BUN 12 8  CREATININE 0.69 0.63  CALCIUM 10.2 8.9   LFT  Recent Labs  10/23/13 1458  PROT 8.0  ALBUMIN 4.2  AST 16  ALT 10  ALKPHOS 67  BILITOT 0.3    STUDIES: Ct Abdomen Pelvis W Contrast  10/23/2013   CLINICAL DATA:  Epigastric abdominal pain.  EXAM: CT ABDOMEN AND PELVIS WITH CONTRAST  TECHNIQUE: Multidetector CT imaging of the abdomen and pelvis was performed using the standard protocol following bolus administration of intravenous contrast.  CONTRAST:  40mL OMNIPAQUE IOHEXOL  300 MG/ML SOLN, 178mL OMNIPAQUE IOHEXOL 300 MG/ML SOLN  COMPARISON:  03/18/2011.  FINDINGS: The lung bases are clear. No pleural effusion or pulmonary lesion. The heart is normal in size. No pericardial effusion. A small hiatal hernia is noted.  The liver is unremarkable. No focal hepatic lesions or intrahepatic biliary dilatation. The gallbladder is normal. No common bile duct dilatation. Incidental note is made of pancreatic divisum. The pancreas is normal. No findings for acute pancreatitis. The spleen is normal in size. No focal lesions. The adrenal glands and kidneys are unremarkable.  The stomach, duodenum, small bowel and colon are unremarkable. No inflammatory changes, mass lesions or obstructive findings. There is a large amount of stool throughout the colon suggesting constipation.  No mesenteric or retroperitoneal mass or adenopathy. Small scattered lymph nodes are noted. The aorta and branch vessels are patent. The major venous structures are patent.  The uterus is surgically absent. No pelvic mass or adenopathy. The bladder is mildly distended. No inguinal mass or adenopathy.  The bony structures are unremarkable.  IMPRESSION: No CT findings for acute pancreatitis.  Pancreatic divisum is noted.  No acute abdominal/ pelvic findings, mass lesions or adenopathy.  Large amount of stool throughout the colon suggesting constipation   Electronically Signed   By: Kalman Jewels M.D.   On: 10/23/2013 19:19    PREVIOUS ENDOSCOPIES:            EUS June 2013 [facility] EGD: The esophageal, gastric and duodenal mucosa appeared normal. EUS: The pancreas parenchyma appeared heterogeneous without masses, cysts or changes of chronic pancreatitis. The pancreatic duct was non-dilated with endosonographic features consistent with pancreas divisum. The biliary system was non-dilated and had no endosonographic abnormalities. The scope was then completely withdrawn from the patient and the procedure  terminated.  COMPLICATIONS: There were no complications  STAGING: Not applicable  ENDOSCOPIC IMPRESSION: 1. Pancreas had no masses, cysts, changes of chronic pancreatitis or PD dilation. 2. Endosonographic features of pancreas divisum 3. The biliary system was non-dilated and  had no endosonographic abnormalities.  RECOMMENDATION: No clear etiology of pain discernible from endoscopy and EUS Follow up with primary physician  Colonoscopy  January 2013  OPERATIVE PROCEDURE REPORT - 05/13/2011  PATIENT: Azari, Janssens MR #: [id number] BIRTHDATE: 1955-12-22 GENDER: Female ATTENDING: Elroy Channel M.D. referring md: FELLOW: ASSISTANT: Lucretia Roers R.N.  procedure: 1. colonoscopy and biopsy indications: 1. Anemia, non-specific 2. Chronic diarrhea medications: 1. See Anesthesia Report.  description of procedure: After the risks benefits and alternatives of the procedure were thoroughly explained, Informed consent was obtained. A digital rectal exam was performed and revealed no abnormalities of the rectum. The [id number] endoscopewas introduced through the anus and advanced to the cecum, which was identifiedby both the appendix and ileocecal valve. The prep was excellent. The  instrument was then slowly withdrawn as the colon was fully examined.  The mucosa appeared normal throughout the entire examined colon. Multiple biopsies were performed. Samples were sent for histology analysis. Cold forceps were used. Retroflexed views revealed no abnormalities. The scope was then completely withdrawn from the patient and the procedure terminated.  limitations: without limitations complications: There were no complications.  endoscopic impression: 1. Normal lumen throughout the entire examined colon 2. Retroflexed views revealed no abnormalities  recommendation: 1. Return to referring physician 2. Await pathology results   EGD Jan 2013 OPERATIVE PROCEDURE REPORT -  05/13/2011  PATIENT: Jovonna, Nickell MR #: [id number] BIRTHDATE: [birth year] GENDER: Female ATTENDING: Elroy Channel M.D. referring md: FELLOW: ASSISTANT: Lucretia Roers R.N.  procedure: 1. EGD w/ biopsy  indications: 1. Anemia, non-specific 2. Chronic diarrhea  description of procedure: After the risks, benefits, and alternatives to the procedure were explained, informed consent was obtained. The patient was anesthetized and the [id number] endoscope was introduced through the mouth and advanced to the second portion of the duodenum. The instrument was slowly withdrawn as the mucosa was fully examined.  The entire examined esophagus appeared normal. The stomach was entered and closely examined. The gastric body, antrum and angularis were well visualized and were normal. The gastric fundus and cardia were normal including a retroflexed view. The stomach wall was normally distensible. The scope passed easily through the pylorus into the duodenum. The exam showed no abnormalities in the entire duodenum. A biopsy was performed. Sample obtained for histology. Cold forceps were used. Retroflexion views revealed no abnormalities. The endoscope was then slowly withdrawn and removed.  impressions: 1. Normal esophagus 2. Normal stomach 3. Normal in the entire duodenum   PATHOLOGY:  RECEIVED: 05/13/2011 ORDERING PHYSICIAN: JASON DAVID Newman Pies , MD PATIENT NAME: Almyra Brace MARIE SURGICAL PATHOLOGY REPORT  FINAL PATHOLOGIC DIAGNOSIS*MICROSCOPIC EXAMINATION AND DIAGNOSIS  A. DUODENUM, BIOPSY: No features of celiac disease. See comment.  B. COLON, BIOPSY: No specific pathologic changes. See comment.    Impression / Plan:   42. 58 year old female with chronic LUQ pain. She does have a history of pancreatic divisum, s/p papillotomy with stent placement / removal at [facility]. Though some imaging studies through the years have suggested PD dilation, there has been no evidence for  acute or chronic pancreatitis by imaging studies (numerous CTscans at [facility]) or by EUS. PD normal by EUS. CTscan and lipase this admission also unremarkable.  Etiology of her chronic LUQ unexplained despite extensive workup. It should be noted that patient reports she felt much better when pancreatic stent was in. Doubtful we will have much to add to her case. Would recommend PPI as she takes TUMS  at home. Will consider EGD.   2. Altered bowel habits. She reports some loose stool but CTscan shows large amount of stool. Suppose she could have been having overflow. Constipation, resolved after enema.   Thanks   LOS: 2 days   Tye Savoy  10/25/2013, 11:00 AM

## 2013-10-25 NOTE — Consult Note (Signed)
Patient seen, examined, and I agree with the above documentation, including the assessment and plan. Unclear source of chronic abd pain.  Hx of pancreatic stent placement and divisum, but no radiographic evidence of chronic pancreatitis EGD tomorrow to r/o PUD or other UGI source of abd pain The nature of the procedure, as well as the risks, benefits, and alternatives were carefully and thoroughly reviewed with the patient. Ample time for discussion and questions allowed. The patient understood, was satisfied, and agreed to proceed.

## 2013-10-25 NOTE — Progress Notes (Signed)
I saw this patient with Dr Denton Brick this morning. I have discussed the plan of care with my residents including Dr Denton Brick. Given that the patient's abdominal pain is not resolved despite having a large BM yesterday and remains to the same intensity, I would obtain a GI consult. PUD could be one of the differentials given the patient's anemia. Please read the resident note for details of the management plan.

## 2013-10-25 NOTE — Progress Notes (Signed)
Subjective: Patient had a large bowel movement yesterday, no change in severity of abdominal pain after bowel movement. Nausea still persistent, not vomiting. Pain worse with meals, specifically Left upper quadrant, radiating to back.   Objective: Vital signs in last 24 hours: Filed Vitals:   10/24/13 0529 10/24/13 1506 10/24/13 2108 10/25/13 0512  BP: 146/73 137/76 150/78 136/75  Pulse: 70 84 86 100  Temp: 97.8 F (36.6 C) 98.3 F (36.8 C) 98.2 F (36.8 C) 98.4 F (36.9 C)  TempSrc: Oral Oral Oral Oral  Resp: 17 19 16 15   Height:      Weight:    120 lb 14.4 oz (54.84 kg)  SpO2: 100% 98% 98% 98%   Weight change: -1 lb 9.6 oz (-0.726 kg)  Intake/Output Summary (Last 24 hours) at 10/25/13 1032 Last data filed at 10/24/13 2100  Gross per 24 hour  Intake 1158.33 ml  Output      0 ml  Net 1158.33 ml   General appearance: alert, pleasant, cooperative and no distress Head: Normocephalic, without obvious abnormality, atraumatic Lungs: clear to auscultation bilaterally, no added sounds Heart: regular rate and rhythm, S1, S2 normal, no murmur, click, rub or gallop Abdomen: soft, tenderness left upper quadrant; bowel sounds normoactive all four quadrants; no masses, no organomegaly. Extremities: extremities normal, atraumatic, no cyanosis or edema  Lab Results: Basic Metabolic Panel:  Recent Labs Lab 10/23/13 1458 10/24/13 0820  NA 138 144  K 3.9 4.0  CL 96 106  CO2 26 27  GLUCOSE 109* 121*  BUN 12 8  CREATININE 0.69 0.63  CALCIUM 10.2 8.9   Liver Function Tests:  Recent Labs Lab 10/23/13 1458  AST 16  ALT 10  ALKPHOS 67  BILITOT 0.3  PROT 8.0  ALBUMIN 4.2    Recent Labs Lab 10/23/13 1458  LIPASE 27   CBC:  Recent Labs Lab 10/23/13 1458  WBC 7.6  NEUTROABS 3.2  HGB 12.3  HCT 36.3  MCV 75.8*  PLT 368   CBG:  Recent Labs Lab 10/23/13 2228 10/24/13 0748 10/24/13 1159 10/24/13 1702 10/24/13 2109 10/25/13 0731  GLUCAP 100* 105* 122* 101*  87 87   Urine Drug Screen: Drugs of Abuse     Component Value Date/Time   LABOPIA POSITIVE* 11/20/2012 2030   LABOPIA NEG 08/30/2011 1526   COCAINSCRNUR NONE DETECTED 11/20/2012 2030   COCAINSCRNUR NEG 08/30/2011 1526   LABBENZ NONE DETECTED 11/20/2012 2030   LABBENZ NEG 08/30/2011 1526   LABBENZ NEG 01/24/2011 1414   AMPHETMU NONE DETECTED 11/20/2012 2030   AMPHETMU NEG 01/24/2011 1414   THCU NONE DETECTED 11/20/2012 2030   LABBARB NONE DETECTED 11/20/2012 2030   LABBARB NEG 08/30/2011 1526    Urinalysis:  Recent Labs Lab 10/23/13 1707  COLORURINE YELLOW  LABSPEC 1.008  PHURINE 6.0  GLUCOSEU NEGATIVE  HGBUR NEGATIVE  BILIRUBINUR NEGATIVE  KETONESUR NEGATIVE  PROTEINUR NEGATIVE  UROBILINOGEN 0.2  NITRITE NEGATIVE  LEUKOCYTESUR LARGE*   Studies/Results: Ct Abdomen Pelvis W Contrast  10/23/2013   CLINICAL DATA:    IMPRESSION: No CT findings for acute pancreatitis.  Pancreatic divisum is noted.  No acute abdominal/ pelvic findings, mass lesions or adenopathy.  Large amount of stool throughout the colon suggesting constipation   Electronically Signed   By: Kalman Jewels M.D.   On: 10/23/2013 19:19   Medications: I have reviewed the patient's current medications. Scheduled Meds: . enoxaparin (LOVENOX) injection  40 mg Subcutaneous Q24H  . famotidine (PEPCID) IV  20 mg  Intravenous Q12H  . insulin aspart  0-5 Units Subcutaneous QHS  . insulin aspart  0-9 Units Subcutaneous TID WC  . ondansetron (ZOFRAN) IV  4 mg Intravenous Once   Continuous Infusions:   PRN Meds:.HYDROmorphone (DILAUDID) injection, promethazine Assessment/Plan: Principal Problem:   Chronic abdominal pain Active Problems:   DIABETES MELLITUS, TYPE II   HYPERTENSION   GERD   PANCREAS DIVISUM   Abdominal pain   Chronic pancreatitis  Epigastric pain - most likely due to PUD- considering location and increased pain with meals, FOBT pending. Unlikely flare of chronic pancreatitis with normal Lipase, Ct  abdomen- shows stool, pain persistent despite large bowel movement following enema. - Consult GI. - Repeat tap water enema today. - Start D5 0.5IV fluids 125cc/hr  - Dilaudid prn  - phenergan prn for nausea  - Diet as tolerated.  DM type 2:  HgbA1c 7.03 August 2013.  - hold metformin  - CBG monitoring ACHS  - SSI sensitive   HTN:  Stable.  - Can restart bp meds as indicated - Pain control as above  - monitor vitals   Diet: NPO  VTE ppx: Lovenox  Code: Full  Dispo: Disposition is deferred at this time, awaiting improvement of current medical problems.  Anticipated discharge in approximately 1-2 day(s).   The patient does have a current PCP (Jenetta Downer, MD) and does need an [facility] hospital follow-up appointment after discharge.  The patient does not have transportation limitations that hinder transportation to clinic appointments.  .Services Needed at time of discharge: Y = Yes, Blank = No PT:   OT:   RN:   Equipment:   Other:     LOS: 2 days   Jenetta Downer, MD 10/25/2013, 10:32 AM

## 2013-10-26 ENCOUNTER — Encounter (HOSPITAL_COMMUNITY): Payer: Self-pay | Admitting: Internal Medicine

## 2013-10-26 ENCOUNTER — Encounter (HOSPITAL_COMMUNITY)
Admission: EM | Disposition: A | Discharge status: Home / Self Care | Payer: BC Managed Care – PPO | Source: Home / Self Care | Attending: Internal Medicine

## 2013-10-26 DIAGNOSIS — E119 Type 2 diabetes mellitus without complications: Secondary | ICD-10-CM | POA: Diagnosis not present

## 2013-10-26 DIAGNOSIS — R1012 Left upper quadrant pain: Secondary | ICD-10-CM | POA: Diagnosis not present

## 2013-10-26 DIAGNOSIS — G8929 Other chronic pain: Secondary | ICD-10-CM | POA: Diagnosis not present

## 2013-10-26 DIAGNOSIS — I10 Essential (primary) hypertension: Secondary | ICD-10-CM | POA: Diagnosis not present

## 2013-10-26 HISTORY — PX: ESOPHAGOGASTRODUODENOSCOPY: SHX5428

## 2013-10-26 LAB — GLUCOSE, CAPILLARY
GLUCOSE-CAPILLARY: 124 mg/dL — AB (ref 70–99)
GLUCOSE-CAPILLARY: 211 mg/dL — AB (ref 70–99)
Glucose-Capillary: 200 mg/dL — ABNORMAL HIGH (ref 70–99)
Glucose-Capillary: 85 mg/dL (ref 70–99)
Glucose-Capillary: 149 mg/dL — ABNORMAL HIGH (ref 70–99)

## 2013-10-26 SURGERY — EGD (ESOPHAGOGASTRODUODENOSCOPY)
Anesthesia: Moderate Sedation

## 2013-10-26 MED ORDER — PANTOPRAZOLE SODIUM 40 MG PO TBEC
40.0000 mg | DELAYED_RELEASE_TABLET | Freq: Every day | ORAL | Status: DC
  Administered 2013-10-26 – 2013-10-27 (×2): 40 mg via ORAL
  Filled 2013-10-26 (×2): qty 1

## 2013-10-26 MED ORDER — MIDAZOLAM HCL 10 MG/2ML IJ SOLN
INTRAMUSCULAR | Status: DC | PRN
  Administered 2013-10-26: 2 mg via INTRAVENOUS
  Administered 2013-10-26: 1 mg via INTRAVENOUS

## 2013-10-26 MED ORDER — MIDAZOLAM HCL 5 MG/ML IJ SOLN
INTRAMUSCULAR | Status: AC
  Filled 2013-10-26: qty 1

## 2013-10-26 MED ORDER — FENTANYL CITRATE 0.05 MG/ML IJ SOLN
INTRAMUSCULAR | Status: DC | PRN
  Administered 2013-10-26 (×2): 25 ug via INTRAVENOUS

## 2013-10-26 MED ORDER — BUTAMBEN-TETRACAINE-BENZOCAINE 2-2-14 % EX AERO
INHALATION_SPRAY | CUTANEOUS | Status: DC | PRN
  Administered 2013-10-26: 2 via TOPICAL

## 2013-10-26 MED ORDER — FENTANYL CITRATE 0.05 MG/ML IJ SOLN
INTRAMUSCULAR | Status: AC
  Filled 2013-10-26: qty 2

## 2013-10-26 NOTE — Progress Notes (Signed)
Subjective: She still has nausea and LUQ that is well controlled with IV Dilaudid . No vomiting overnight. NPO since midnight but tolerated some food intake yesterday. Denies cough, chest pain, dysuria. Had several loose and watery BMs yesterday (none solid).   Objective: Vital signs in last 24 hours: Filed Vitals:   10/25/13 1156 10/25/13 2108 10/26/13 0640 10/26/13 0820  BP: 147/80 170/89 134/66   Pulse: 76 75 82   Temp: 98 F (36.7 C) 98.3 F (36.8 C) 97.9 F (36.6 C)   TempSrc: Oral Oral Oral   Resp: 16 16 16    Height:      Weight:    120 lb 14.4 oz (54.84 kg)  SpO2: 98% 99% 100%    Weight change:   Intake/Output Summary (Last 24 hours) at 10/26/13 1214 Last data filed at 10/25/13 2130  Gross per 24 hour  Intake 1096.67 ml  Output   1100 ml  Net  -3.33 ml   Vitals reviewed. General: resting in bed, in NAD HEENT:  no scleral icterus Cardiac: RRR, no rubs, murmurs or gallops Pulm: clear to auscultation bilaterally, no wheezes, rales, or rhonchi Abd: soft, nondistended, BS present, tender to gentle palpation of LUQ Ext: warm and well perfused, no pedal edema Neuro: alert and oriented X3, moves 4 extremities voluntarily.   Lab Results: Basic Metabolic Panel:  Recent Labs Lab 10/23/13 1458 10/24/13 0820  NA 138 144  K 3.9 4.0  CL 96 106  CO2 26 27  GLUCOSE 109* 121*  BUN 12 8  CREATININE 0.69 0.63  CALCIUM 10.2 8.9   Liver Function Tests:  Recent Labs Lab 10/23/13 1458  AST 16  ALT 10  ALKPHOS 67  BILITOT 0.3  PROT 8.0  ALBUMIN 4.2    Recent Labs Lab 10/23/13 1458  LIPASE 27   CBC:  Recent Labs Lab 10/23/13 1458  WBC 7.6  NEUTROABS 3.2  HGB 12.3  HCT 36.3  MCV 75.8*  PLT 368   CBG:  Recent Labs Lab 10/25/13 1153 10/25/13 1648 10/25/13 2027 10/25/13 2349 10/26/13 0420 10/26/13 0800  GLUCAP 166* 140* 149* 87 200* 124*   Urine Drug Screen: Drugs of Abuse     Component Value Date/Time   LABOPIA POSITIVE* 11/20/2012 2030     LABOPIA NEG 08/30/2011 1526   COCAINSCRNUR NONE DETECTED 11/20/2012 2030   COCAINSCRNUR NEG 08/30/2011 1526   LABBENZ NONE DETECTED 11/20/2012 2030   LABBENZ NEG 08/30/2011 1526   LABBENZ NEG 01/24/2011 1414   AMPHETMU NONE DETECTED 11/20/2012 2030   AMPHETMU NEG 01/24/2011 1414   THCU NONE DETECTED 11/20/2012 2030   LABBARB NONE DETECTED 11/20/2012 2030   LABBARB NEG 08/30/2011 1526    Urinalysis:  Recent Labs Lab 10/23/13 1707  COLORURINE YELLOW  LABSPEC 1.008  PHURINE 6.0  GLUCOSEU NEGATIVE  HGBUR NEGATIVE  BILIRUBINUR NEGATIVE  KETONESUR NEGATIVE  PROTEINUR NEGATIVE  UROBILINOGEN 0.2  NITRITE NEGATIVE  LEUKOCYTESUR LARGE*    Micro Results: Recent Results (from the past 240 hour(s))  URINE CULTURE     Status: None   Collection Time    10/23/13 10:55 PM      Result Value Ref Range Status   Specimen Description URINE, RANDOM   Final   Special Requests NONE   Final   Culture  Setup Time     Final   Value: 10/24/2013 09:26     Performed at [place]     Final   Value: 9,000 COLONIES/ML  Performed at [facility]     Final   Value: INSIGNIFICANT GROWTH     Performed at [facility]   Report Status 10/25/2013 FINAL   Final   Medications: I have reviewed the patient's current medications. Scheduled Meds: . antiseptic oral rinse  15 mL Mouth Rinse q12n4p  . chlorhexidine  15 mL Mouth Rinse BID  . enoxaparin (LOVENOX) injection  40 mg Subcutaneous Q24H  . famotidine (PEPCID) IV  20 mg Intravenous Q12H  . insulin aspart  0-5 Units Subcutaneous QHS  . insulin aspart  0-9 Units Subcutaneous TID WC  . insulin aspart  0-9 Units Subcutaneous 6 times per day  . ondansetron (ZOFRAN) IV  4 mg Intravenous Once   Continuous Infusions: . dextrose 5 % and 0.45% NaCl 100 mL/hr at 10/26/13 0959   PRN Meds:.HYDROmorphone (DILAUDID) injection, promethazine Assessment/Plan: LUQ pain: Acute on chronic.  Etiology unclear, though  pancreatitis or pancreatic divisum highly unlikely per CT abd and nl lipase. Markedly increased stool burden on CT scan could explain her pain but given the location, PUD is possible. GI has been consulted with plans for EGD today. Pt has been NPO overnight but has persistent nausea (chronic) with no vomiting. Had tap water enema on 6/25 and 6/26 with soft/liquid BMs but persistent abd pain.  - GI consulted, appreciate recs - Follow up EGD findings - Tap water Enema PRN.  - Sennokot- 1 tablets today  - IV fluids D5-1/2NS 161ml/hr while NPO  - dilaudid 1mg  IV q3hr PRN - phenergan 12.5mg  IV q4hr PRN for nausea prn for nausea - famotidine 20mg  IV q 12 h   DM type 2: HgbA1c 7.03 August 2013. CBG in ~200s or less.  - hold metformin  - CBG monitoring q 4h while NPO - SSI sensitive   HTN: Stable.  - Can restart bp meds as indicated  - pain control as above  - monitor vitals   Diet: NPO for EGD today, will resume Clear liquid diet after EGD if ok with GI.   VTE ppx: Lovenox  Code: Full   Dispo: Disposition is deferred at this time, awaiting improvement of current medical problems. Anticipated discharge in approximately 1-2 day(s).   The patient does have a current PCP (Jenetta Downer, MD) and does need an [facility] hospital follow-up appointment after discharge.  The patient does not have transportation limitations that hinder transportation to clinic appointments.  .Services Needed at time of discharge: Y = Yes, Blank = No PT:   OT:   RN:   Equipment:   Other:     LOS: 3 days   Blain Pais, MD 10/26/2013, 12:14 PM

## 2013-10-26 NOTE — Op Note (Signed)
[facility] [place], [id number]   ENDOSCOPY PROCEDURE REPORT  PATIENT: Eileen, Munoz  MR#: [id number] BIRTHDATE: May 27, 1955 , 58  yrs. old GENDER: Female ENDOSCOPIST: Jerene Bears, MD REFERRED BY:  [facility] PROCEDURE DATE:  10/26/2013 PROCEDURE:  EGD w/ biopsy ASA CLASS:     Class III INDICATIONS:  chronic LUQ abd pain, history of pancreatitis MEDICATIONS: These medications were titrated to patient response per physician's verbal order, Propofol (Diprivan), Fentanyl 50 mcg IV, and Versed 4 mg IV TOPICAL ANESTHETIC: Cetacaine Spray  DESCRIPTION OF PROCEDURE: After the risks benefits and alternatives of the procedure were thoroughly explained, informed consent was obtained.  The Pentax Gastroscope [id number] endoscope was introduced through the mouth and advanced to the second portion of the duodenum. Without limitations.  The instrument was slowly withdrawn as the mucosa was fully examined.     ESOPHAGUS: The mucosa of the esophagus appeared normal.  STOMACH: There was mild antral gastropathy noted.  Cold forcep biopsies were taken at the antrum and angularis.   The stomach otherwise appeared normal.  DUODENUM: The duodenal mucosa showed no abnormalities in the bulb and second portion of the duodenum.  Retroflexed views revealed no abnormalities.     The scope was then withdrawn from the patient and the procedure completed.  COMPLICATIONS: There were no complications. ENDOSCOPIC IMPRESSION: 1.   The mucosa of the esophagus appeared normal 2.   There was mild antral gastropathy noted; multiple biopsies obtained 3.   The stomach otherwise appeared normal 4.   The duodenal mucosa showed no abnormalities in the bulb and second portion of the duodenum  RECOMMENDATIONS: 1.  Daily PPI 2.  No likely explanation for chronic abd pain.  Follow-up with primary GI doctor at [facility] as recommended  at office visit there on 10/22/2013  eSigned:  Jerene Bears, MD 10/26/2013 4:12 PM     CC:The Patient

## 2013-10-27 ENCOUNTER — Inpatient Hospital Stay (HOSPITAL_COMMUNITY): Payer: BC Managed Care – PPO

## 2013-10-27 DIAGNOSIS — R1012 Left upper quadrant pain: Secondary | ICD-10-CM | POA: Diagnosis not present

## 2013-10-27 DIAGNOSIS — E119 Type 2 diabetes mellitus without complications: Secondary | ICD-10-CM | POA: Diagnosis not present

## 2013-10-27 DIAGNOSIS — G8929 Other chronic pain: Secondary | ICD-10-CM | POA: Diagnosis not present

## 2013-10-27 DIAGNOSIS — I10 Essential (primary) hypertension: Secondary | ICD-10-CM | POA: Diagnosis not present

## 2013-10-27 LAB — GLUCOSE, CAPILLARY
GLUCOSE-CAPILLARY: 168 mg/dL — AB (ref 70–99)
GLUCOSE-CAPILLARY: 102 mg/dL — AB (ref 70–99)
GLUCOSE-CAPILLARY: 125 mg/dL — AB (ref 70–99)
Glucose-Capillary: 101 mg/dL — ABNORMAL HIGH (ref 70–99)
Glucose-Capillary: 171 mg/dL — ABNORMAL HIGH (ref 70–99)
Glucose-Capillary: 172 mg/dL — ABNORMAL HIGH (ref 70–99)

## 2013-10-27 LAB — CBC
HEMATOCRIT: 32.1 % — AB (ref 36.0–46.0)
HEMOGLOBIN: 11 g/dL — AB (ref 12.0–15.0)
MCH: 25.8 pg — ABNORMAL LOW (ref 26.0–34.0)
MCHC: 34.3 g/dL (ref 30.0–36.0)
MCV: 75.4 fL — AB (ref 78.0–100.0)
Platelets: 335 10*3/uL (ref 150–400)
RBC: 4.26 MIL/uL (ref 3.87–5.11)
RDW: 14.4 % (ref 11.5–15.5)
WBC: 8.4 10*3/uL (ref 4.0–10.5)

## 2013-10-27 MED ORDER — SODIUM CHLORIDE 0.9 % IV SOLN
INTRAVENOUS | Status: DC
  Administered 2013-10-27: 17:00:00 via INTRAVENOUS

## 2013-10-27 MED ORDER — ACETAMINOPHEN 80 MG PO CHEW: 80.0000 mg | CHEWABLE_TABLET | Freq: Four times a day (QID) | ORAL | Status: DC | PRN

## 2013-10-27 MED ORDER — PROMETHAZINE HCL 25 MG/ML IJ SOLN
12.5000 mg | Freq: Four times a day (QID) | INTRAMUSCULAR | Status: DC | PRN
  Administered 2013-10-27 – 2013-10-28 (×3): 12.5 mg via INTRAVENOUS
  Filled 2013-10-27 (×3): qty 1

## 2013-10-27 MED ORDER — INSULIN ASPART 100 UNIT/ML ~~LOC~~ SOLN: 0.0000 [IU] | Freq: Three times a day (TID) | SUBCUTANEOUS | Status: DC

## 2013-10-27 MED ORDER — PROMETHAZINE HCL 25 MG PO TABS: 12.5000 mg | ORAL_TABLET | Freq: Four times a day (QID) | ORAL | Status: DC | PRN

## 2013-10-27 MED ORDER — SENNA 8.6 MG PO TABS: 1.0000 | ORAL_TABLET | Freq: Every day | ORAL | Status: DC

## 2013-10-27 MED ORDER — PANTOPRAZOLE SODIUM 40 MG IV SOLR
40.0000 mg | INTRAVENOUS | Status: DC
  Administered 2013-10-27 – 2013-10-29 (×3): 40 mg via INTRAVENOUS
  Filled 2013-10-27 (×4): qty 40

## 2013-10-27 MED ORDER — ACETAMINOPHEN 325 MG PO TABS
650.0000 mg | ORAL_TABLET | Freq: Four times a day (QID) | ORAL | Status: DC | PRN
  Administered 2013-10-27: 650 mg via ORAL
  Filled 2013-10-27: qty 2

## 2013-10-27 MED ORDER — KETOROLAC TROMETHAMINE 15 MG/ML IJ SOLN
15.0000 mg | Freq: Once | INTRAMUSCULAR | Status: AC
  Administered 2013-10-27: 15 mg via INTRAVENOUS
  Filled 2013-10-27: qty 1

## 2013-10-27 MED ORDER — KETOROLAC TROMETHAMINE 30 MG/ML IJ SOLN
30.0000 mg | Freq: Four times a day (QID) | INTRAMUSCULAR | Status: AC | PRN
  Administered 2013-10-27: 30 mg via INTRAVENOUS
  Filled 2013-10-27: qty 1

## 2013-10-27 MED ORDER — INSULIN ASPART 100 UNIT/ML ~~LOC~~ SOLN
0.0000 [IU] | SUBCUTANEOUS | Status: DC
  Administered 2013-10-28 (×2): 2 [IU] via SUBCUTANEOUS
  Administered 2013-10-28 (×2): 1 [IU] via SUBCUTANEOUS
  Administered 2013-10-29: 2 [IU] via SUBCUTANEOUS
  Administered 2013-10-29: 1 [IU] via SUBCUTANEOUS
  Administered 2013-10-29 (×2): 2 [IU] via SUBCUTANEOUS

## 2013-10-27 NOTE — Progress Notes (Signed)
Subjective: Her LUQ abdominal pain is still present. She has been eating small amounts of foods with no vomiting but has persistent vomiting. Reports feeling bloated. Denies CP, SOB, dysuria. Has not had a BM today and thinks she is not passing flatus.   Objective: Vital signs in last 24 hours: Filed Vitals:   10/26/13 1600 10/26/13 1616 10/26/13 2133 10/27/13 0614  BP: 164/81 161/86 125/70 138/76  Pulse: 76 75 76 74  Temp:  97.7 F (36.5 C) 97.8 F (36.6 C) 98.1 F (36.7 C)  TempSrc:  Oral Oral Oral  Resp: 12 14 15 16   Height:      Weight:    127 lb 8 oz (57.834 kg)  SpO2: 99% 99% 99% 100%   Weight change:   Intake/Output Summary (Last 24 hours) at 10/27/13 1336 Last data filed at 10/27/13 1231  Gross per 24 hour  Intake   4332 ml  Output   4600 ml  Net   -268 ml   Vitals reviewed.  General: resting in bed, in NAD  HEENT: no scleral icterus  Cardiac: RRR, no rubs, murmurs or gallops  Pulm: clear to auscultation bilaterally, no wheezes, rales, or rhonchi  Abd: soft, nondistended, hyperactive BS present, tender to gentle palpation of LUQ  Ext: warm and well perfused, no pedal edema  Neuro: alert and oriented X3, moves 4 extremities voluntarily.   Lab Results: Basic Metabolic Panel:  Recent Labs Lab 10/23/13 1458 10/24/13 0820  NA 138 144  K 3.9 4.0  CL 96 106  CO2 26 27  GLUCOSE 109* 121*  BUN 12 8  CREATININE 0.69 0.63  CALCIUM 10.2 8.9   Liver Function Tests:  Recent Labs Lab 10/23/13 1458  AST 16  ALT 10  ALKPHOS 67  BILITOT 0.3  PROT 8.0  ALBUMIN 4.2    Recent Labs Lab 10/23/13 1458  LIPASE 27   CBC:  Recent Labs Lab 10/23/13 1458  WBC 7.6  NEUTROABS 3.2  HGB 12.3  HCT 36.3  MCV 75.8*  PLT 368   CBG:  Recent Labs Lab 10/26/13 1623 10/26/13 1929 10/26/13 2348 10/27/13 0330 10/27/13 0747 10/27/13 1151  GLUCAP 85 149* 172* 101* 171* 168*   Urine Drug Screen: Drugs of Abuse     Component Value Date/Time   LABOPIA  POSITIVE* 11/20/2012 2030   LABOPIA NEG 08/30/2011 1526   COCAINSCRNUR NONE DETECTED 11/20/2012 2030   COCAINSCRNUR NEG 08/30/2011 1526   LABBENZ NONE DETECTED 11/20/2012 2030   LABBENZ NEG 08/30/2011 1526   LABBENZ NEG 01/24/2011 1414   AMPHETMU NONE DETECTED 11/20/2012 2030   AMPHETMU NEG 01/24/2011 1414   THCU NONE DETECTED 11/20/2012 2030   LABBARB NONE DETECTED 11/20/2012 2030   LABBARB NEG 08/30/2011 1526    Urinalysis:  Recent Labs Lab 10/23/13 1707  COLORURINE YELLOW  LABSPEC 1.008  PHURINE 6.0  GLUCOSEU NEGATIVE  HGBUR NEGATIVE  BILIRUBINUR NEGATIVE  KETONESUR NEGATIVE  PROTEINUR NEGATIVE  UROBILINOGEN 0.2  NITRITE NEGATIVE  LEUKOCYTESUR LARGE*    Micro Results: Recent Results (from the past 240 hour(s))  URINE CULTURE     Status: None   Collection Time    10/23/13 10:55 PM      Result Value Ref Range Status   Specimen Description URINE, RANDOM   Final   Special Requests NONE   Final   Culture  Setup Time     Final   Value: 10/24/2013 09:26     Performed at [facility]  Count     Final   Value: 9,000 COLONIES/ML     Performed at [facility]   Culture     Final   Value: INSIGNIFICANT GROWTH     Performed at [facility]   Report Status 10/25/2013 FINAL   Final  Medications: I have reviewed the patient's current medications. Scheduled Meds: . antiseptic oral rinse  15 mL Mouth Rinse q12n4p  . chlorhexidine  15 mL Mouth Rinse BID  . enoxaparin (LOVENOX) injection  40 mg Subcutaneous Q24H  . insulin aspart  0-9 Units Subcutaneous 6 times per day  . pantoprazole  40 mg Oral QAC breakfast   Continuous Infusions:  PRN Meds:.HYDROmorphone (DILAUDID) injection, promethazine Assessment/Plan: LUQ pain: Acute on chronic. Etiology unclear, though pancreatitis or pancreatic divisum highly unlikely per CT abd and nl lipase. Markedly increased stool burden on CT scan could explain her pain but given the location, PUD was considered. GI was  consulted. She underwent EGD on 6/27 which revealed mild antral gastropathy  But no explanation for her chronic abdominal pain.  She had tap water enema on 6/25 and 6/26 with soft/liquid BMs but persistent abd pain. Abdominal Xray today with findings that may represent ileus. Pt with no vomiting at this time, no clear indication for NG tube.  - GI consulted, signed off, recommend outpatient f/u at [employer] [facility], will avoid narcotics of medications that may worsen ileus - phenergan 12.5mg  PO q6h PRN for nausea - Protonix 40mg  IV  DM type 2: HgbA1c 7.03 August 2013. CBG in  <200.  - hold metformin  - SSI sensitive  - CBG TID ac  HTN: Stable.  - Can restart bp meds as indicated  - pain control as above  - monitor vitals   Diet: NPO VTE ppx: Lovenox  Code: Full   Dispo: Disposition is deferred at this time, awaiting improvement of current medical problems. Anticipated discharge in approximately 1-2 day(s).   The patient does have a current PCP (Jenetta Downer, MD) and does need an [facility] hospital follow-up appointment after discharge  The patient does not have transportation limitations that hinder transportation to clinic appointments.  .Services Needed at time of discharge: Y = Yes, Blank = No PT:   OT:   RN:   Equipment:   Other:     LOS: 4 days   Blain Pais, MD 10/27/2013, 1:36 PM

## 2013-10-27 NOTE — Progress Notes (Signed)
Patient tolerated 450cc of tap water enema. Held for approximately 7 minutes and returned water, no stool.

## 2013-10-28 ENCOUNTER — Encounter (HOSPITAL_COMMUNITY): Payer: Self-pay | Admitting: Internal Medicine

## 2013-10-28 DIAGNOSIS — E119 Type 2 diabetes mellitus without complications: Secondary | ICD-10-CM | POA: Diagnosis not present

## 2013-10-28 DIAGNOSIS — G8929 Other chronic pain: Secondary | ICD-10-CM | POA: Diagnosis not present

## 2013-10-28 DIAGNOSIS — I10 Essential (primary) hypertension: Secondary | ICD-10-CM | POA: Diagnosis not present

## 2013-10-28 DIAGNOSIS — R1012 Left upper quadrant pain: Secondary | ICD-10-CM | POA: Diagnosis not present

## 2013-10-28 LAB — COMPREHENSIVE METABOLIC PANEL
ALT: 9 U/L (ref 0–35)
AST: 24 U/L (ref 0–37)
Albumin: 3.3 g/dL — ABNORMAL LOW (ref 3.5–5.2)
Alkaline Phosphatase: 59 U/L (ref 39–117)
BILIRUBIN TOTAL: 0.4 mg/dL (ref 0.3–1.2)
BUN: 3 mg/dL — AB (ref 6–23)
CHLORIDE: 105 meq/L (ref 96–112)
CO2: 22 meq/L (ref 19–32)
CREATININE: 0.53 mg/dL (ref 0.50–1.10)
Calcium: 9 mg/dL (ref 8.4–10.5)
GFR calc Af Amer: >90 mL/min (ref >90)
GLUCOSE: 144 mg/dL — AB (ref 70–99)
POTASSIUM: 3.7 meq/L (ref 3.7–5.3)
Sodium: 143 mEq/L (ref 137–147)
Total Protein: 7.1 g/dL (ref 6.0–8.3)

## 2013-10-28 LAB — GLUCOSE, CAPILLARY
GLUCOSE-CAPILLARY: 152 mg/dL — AB (ref 70–99)
Glucose-Capillary: 112 mg/dL — ABNORMAL HIGH (ref 70–99)
Glucose-Capillary: 132 mg/dL — ABNORMAL HIGH (ref 70–99)
Glucose-Capillary: 144 mg/dL — ABNORMAL HIGH (ref 70–99)
Glucose-Capillary: 148 mg/dL — ABNORMAL HIGH (ref 70–99)
Glucose-Capillary: 152 mg/dL — ABNORMAL HIGH (ref 70–99)

## 2013-10-28 LAB — LACTIC ACID, PLASMA: LACTIC ACID, VENOUS: 0.9 mmol/L (ref 0.5–2.2)

## 2013-10-28 MED ORDER — SORBITOL 70 % SOLN
960.0000 mL | TOPICAL_OIL | Freq: Once | ORAL | Status: AC
  Administered 2013-10-28: 960 mL via RECTAL
  Filled 2013-10-28: qty 240

## 2013-10-28 MED ORDER — DEXTROSE-NACL 5-0.45 % IV SOLN
INTRAVENOUS | Status: DC
  Administered 2013-10-28 – 2013-10-29 (×2): via INTRAVENOUS

## 2013-10-28 MED ORDER — LISINOPRIL 40 MG PO TABS
40.0000 mg | ORAL_TABLET | Freq: Every day | ORAL | Status: DC
  Administered 2013-10-28 – 2013-10-29 (×2): 40 mg via ORAL
  Filled 2013-10-28: qty 1
  Filled 2013-10-28: qty 2
  Filled 2013-10-28: qty 1

## 2013-10-28 MED ORDER — LISINOPRIL-HYDROCHLOROTHIAZIDE 20-12.5 MG PO TABS: 2.0000 | ORAL_TABLET | Freq: Every day | ORAL | Status: DC

## 2013-10-28 MED ORDER — HYDROCHLOROTHIAZIDE 25 MG PO TABS
25.0000 mg | ORAL_TABLET | Freq: Every day | ORAL | Status: DC
  Administered 2013-10-28 – 2013-10-29 (×2): 25 mg via ORAL
  Filled 2013-10-28 (×3): qty 1

## 2013-10-28 MED ORDER — KETOROLAC TROMETHAMINE 60 MG/2ML IM SOLN
60.0000 mg | Freq: Once | INTRAMUSCULAR | Status: AC
  Administered 2013-10-28: 60 mg via INTRAMUSCULAR
  Filled 2013-10-28: qty 2

## 2013-10-28 MED ORDER — PANCRELIPASE (LIP-PROT-AMYL) 12000-38000 UNITS PO CPEP
1.0000 | ORAL_CAPSULE | Freq: Three times a day (TID) | ORAL | Status: DC
  Administered 2013-10-28 – 2013-10-29 (×3): 1 via ORAL
  Filled 2013-10-28 (×5): qty 1

## 2013-10-28 NOTE — Progress Notes (Signed)
Pt c/o of pain, states the toradol did not help again but she knew she could not have anything else.  Pt refused another prn dose of toradol and tylenol.  Also c/o of nausea, prn phenegran given.

## 2013-10-28 NOTE — Progress Notes (Signed)
Pt c/o severe pain, states the15mg  one time dose of  toradol did not help at all.  MD paged, Dr. Redmond Pulling to put in another order for 30mg  toradol q6 hours prn.  Will monitor

## 2013-10-28 NOTE — Progress Notes (Signed)
  PROGRESS NOTE MEDICINE TEACHING ATTENDING   Day 5 of stay Patient name: [name]   Medical record number: [id number] Date of birth: March 23, 1956   Patient had a large BM after today's enema and feels slight ease in her pain. Reports slight blood in stool today. Still, quite tender to palpation in LUQ. No pain at other sites in the abdomen.  Unsure etiology of pain at present - chronic constipation and chronic pancreatitis combined perhaps. No more enemas. We will start Creon today. If the patient feels better today, and is able to tolerate a diet, we can think of possible discharge (tomorrow?) - with toradol and tylenol (no opiates) with follow up at [facility] in 1-2 days. For now, we will observe. Dr Denton Brick will discuss the patient's case with her GI at [facility] today.  I have discussed the care of this patient with my IM team residents. Please see the resident note for details.  Madilyn Fireman 10/28/2013, 2:32 PM.

## 2013-10-28 NOTE — Progress Notes (Signed)
Pt sleeping, no complaints at this time.

## 2013-10-28 NOTE — Progress Notes (Signed)
Subjective: Complaints of persistent abdominal pain, with nausea. Enema yesterday, no stool. Toradol for pain has not helped. Still feels full .  Objective: Vital signs in last 24 hours: Filed Vitals:   10/27/13 1433 10/27/13 1727 10/27/13 2240 10/28/13 0526  BP: 141/75  161/73 160/83  Pulse: 87  81 77  Temp: 97.6 F (36.4 C) 98.9 F (37.2 C) 98.2 F (36.8 C) 98.2 F (36.8 C)  TempSrc: Oral Oral Oral   Resp: 15  16 15   Height:      Weight:    126 lb 4.8 oz (57.289 kg)  SpO2: 100%  99% 97%   Weight change: 5 lb 6.4 oz (2.449 kg)  Intake/Output Summary (Last 24 hours) at 10/28/13 1127 Last data filed at 10/28/13 0800  Gross per 24 hour  Intake   2180 ml  Output   2500 ml  Net   -320 ml   Physical Exam-  General: NAD, lying in bed. HEENT: Moist oral mucosa, no sclera icterus Cardiac: RRR, no rubs, murmurs or gallops  Pulm: No added sounds., moving equal volumes of air. Abd: soft, nondistended, normal Bowel Sounds, persistent tenderness to gentle palpation and placement of stethoscope  Ext: warm and well perfused, no pedal edema  Neuro: alert and oriented X3, moving all extremities.   Lab Results: Basic Metabolic Panel:  Recent Labs Lab 10/24/13 0820 10/28/13 0015  NA 144 143  K 4.0 3.7  CL 106 105  CO2 27 22  GLUCOSE 121* 144*  BUN 8 3*  CREATININE 0.63 0.53  CALCIUM 8.9 9.0   Liver Function Tests:  Recent Labs Lab 10/23/13 1458 10/28/13 0015  AST 16 24  ALT 10 9  ALKPHOS 67 59  BILITOT 0.3 0.4  PROT 8.0 7.1  ALBUMIN 4.2 3.3*    Recent Labs Lab 10/23/13 1458  LIPASE 27   CBC:  Recent Labs Lab 10/23/13 1458 10/27/13 1840  WBC 7.6 8.4  NEUTROABS 3.2  --   HGB 12.3 11.0*  HCT 36.3 32.1*  MCV 75.8* 75.4*  PLT 368 335   CBG:  Recent Labs Lab 10/27/13 1151 10/27/13 1556 10/27/13 2003 10/28/13 0005 10/28/13 0405 10/28/13 0759  GLUCAP 168* 102* 125* 148* 144* 152*     Urinalysis:  Recent Labs Lab 10/23/13 1707    COLORURINE YELLOW  LABSPEC 1.008  PHURINE 6.0  GLUCOSEU NEGATIVE  HGBUR NEGATIVE  BILIRUBINUR NEGATIVE  KETONESUR NEGATIVE  PROTEINUR NEGATIVE  UROBILINOGEN 0.2  NITRITE NEGATIVE  LEUKOCYTESUR LARGE*    Micro Results: Recent Results (from the past 240 hour(s))  URINE CULTURE     Status: None   Collection Time    10/23/13 10:55 PM      Result Value Ref Range Status   Specimen Description URINE, RANDOM   Final   Special Requests NONE   Final   Culture  Setup Time     Final   Value: 10/24/2013 09:26     Performed at [place]     Final   Value: 9,000 COLONIES/ML     Performed at [facility]   Culture     Final   Value: INSIGNIFICANT GROWTH     Performed at [facility]   Report Status 10/25/2013 FINAL   Final  Medications: I have reviewed the patient's current medications. Scheduled Meds: . antiseptic oral rinse  15 mL Mouth Rinse q12n4p  . chlorhexidine  15 mL Mouth Rinse BID  . enoxaparin (LOVENOX) injection  40 mg Subcutaneous Q24H  . lisinopril  40 mg Oral Daily   And  . hydrochlorothiazide  25 mg Oral Daily  . insulin aspart  0-9 Units Subcutaneous 6 times per day  . pantoprazole (PROTONIX) IV  40 mg Intravenous Q24H   Continuous Infusions: . sodium chloride 75 mL/hr at 10/27/13 1648   PRN Meds:.acetaminophen, ketorolac, promethazine Assessment/Plan: LUQ pain: Acute on chronic. Exact Etiology not identified. Acute pancreatitis and PUD ruled out.  Abd xray yesterday 10/28/2013 with features consistent with ileus. Patients gastroenterologist at [facility]- Dr Alvester Chou, not available, and Dr Claudette Laws, saw pt on 10/23/2013 before pt was sent here, not available. Talked to Dr. Roney Mans, Gastroenterologist at [facility], and recs- try Linzess and Creon and pt should follow up with Dr. Claudette Laws on discharge. - Smog enema, as pt still has stool. - No opioid pain meds. - Toradol 60 IM X1 - phenergan 12.5mg  PO q6h PRN for nausea - Protonix 40mg  IV  daily  DM type 2: HgbA1c 7.03 August 2013. CBG in  <200.  - hold metformin  - SSI sensitive  - CBG TID ac  HTN: Elevated today.  - Restart home BP meds- lisinopril-HCTZ- 40-25 daily.  Diet: NPO pt has a mild Anion gap- 16, with normal bicarb- 22,  likely from starvation. - start IVF D51/2 NS  VTE ppx: Lovenox   Code: Full   Dispo: Disposition is deferred at this time, awaiting improvement of current medical problems. Anticipated discharge in approximately 1-2 day(s).   The patient does have a current PCP (Jenetta Downer, MD) and does need an [facility] hospital follow-up appointment after discharge  The patient does not have transportation limitations that hinder transportation to clinic appointments.  .Services Needed at time of discharge: Y = Yes, Blank = No PT:   OT:   RN:   Equipment:   Other:     LOS: 5 days   Jenetta Downer, MD 10/28/2013, 11:27 AM

## 2013-10-29 DIAGNOSIS — I10 Essential (primary) hypertension: Secondary | ICD-10-CM | POA: Diagnosis not present

## 2013-10-29 DIAGNOSIS — R1012 Left upper quadrant pain: Secondary | ICD-10-CM | POA: Diagnosis not present

## 2013-10-29 DIAGNOSIS — E119 Type 2 diabetes mellitus without complications: Secondary | ICD-10-CM | POA: Diagnosis not present

## 2013-10-29 LAB — GLUCOSE, CAPILLARY
GLUCOSE-CAPILLARY: 145 mg/dL — AB (ref 70–99)
GLUCOSE-CAPILLARY: 159 mg/dL — AB (ref 70–99)
GLUCOSE-CAPILLARY: 195 mg/dL — AB (ref 70–99)
Glucose-Capillary: 151 mg/dL — ABNORMAL HIGH (ref 70–99)

## 2013-10-29 MED ORDER — PANCRELIPASE (LIP-PROT-AMYL) 12000-38000 UNITS PO CPEP: 1.0000 | ORAL_CAPSULE | Freq: Three times a day (TID) | ORAL | Status: DC

## 2013-10-29 MED ORDER — SENNOSIDES-DOCUSATE SODIUM 8.6-50 MG PO TABS: 2.0000 | ORAL_TABLET | Freq: Every day | ORAL | Status: DC

## 2013-10-29 MED ORDER — PANTOPRAZOLE SODIUM 20 MG PO TBEC: 20.0000 mg | DELAYED_RELEASE_TABLET | Freq: Every day | ORAL | Status: DC

## 2013-10-29 NOTE — Progress Notes (Signed)
Subjective: No complaints today, feels better today. Will like to go home. Tolerating diet- Clear and then liquid diet. Bowel movement today after SMOG. Pt will like to continue Creon, but say sshe cannot afford it, as she has had to pay 150 dollars a month for same meds.  Objective: Vital signs in last 24 hours: Filed Vitals:   10/28/13 1300 10/28/13 2226 10/29/13 0534 10/29/13 1300  BP: 159/86 145/91 147/85 138/73  Pulse: 96 93 89 95  Temp: 99.2 F (37.3 C) 98.9 F (37.2 C) 98.4 F (36.9 C) 98.9 F (37.2 C)  TempSrc: Oral Oral Oral Oral  Resp: 16 17 16 16   Height:      Weight:   125 lb 3.2 oz (56.79 kg)   SpO2: 98% 98% 98% 98%   Weight change: -1 lb 1.6 oz (-0.499 kg)  Intake/Output Summary (Last 24 hours) at 10/29/13 1553 Last data filed at 10/29/13 0415  Gross per 24 hour  Intake    440 ml  Output    400 ml  Net     40 ml   Physical Exam-  General: Appear better today, NAD, lying in bed. HEENT: Moist oral mucosa, no sclera icterus Cardiac: RRR, no added sounds. Pulm: No added sounds., moving equal volumes of air. Abd: soft, nondistended, normal Bowel Sounds, tenderness on palpation better today.   Ext: warm and well perfused, no pedal edema  Neuro: alert and oriented X3, moving all extremities.   Lab Results: Basic Metabolic Panel:  Recent Labs Lab 10/24/13 0820 10/28/13 0015  NA 144 143  K 4.0 3.7  CL 106 105  CO2 27 22  GLUCOSE 121* 144*  BUN 8 3*  CREATININE 0.63 0.53  CALCIUM 8.9 9.0   Liver Function Tests:  Recent Labs Lab 10/23/13 1458 10/28/13 0015  AST 16 24  ALT 10 9  ALKPHOS 67 59  BILITOT 0.3 0.4  PROT 8.0 7.1  ALBUMIN 4.2 3.3*    Recent Labs Lab 10/23/13 1458  LIPASE 27   CBC:  Recent Labs Lab 10/23/13 1458 10/27/13 1840  WBC 7.6 8.4  NEUTROABS 3.2  --   HGB 12.3 11.0*  HCT 36.3 32.1*  MCV 75.8* 75.4*  PLT 368 335   CBG:  Recent Labs Lab 10/28/13 1622 10/28/13 1955 10/29/13 0004 10/29/13 0355  10/29/13 0756 10/29/13 1158  GLUCAP 152* 112* 151* 145* 159* 195*     Urinalysis:  Recent Labs Lab 10/23/13 1707  COLORURINE YELLOW  LABSPEC 1.008  PHURINE 6.0  GLUCOSEU NEGATIVE  HGBUR NEGATIVE  BILIRUBINUR NEGATIVE  KETONESUR NEGATIVE  PROTEINUR NEGATIVE  UROBILINOGEN 0.2  NITRITE NEGATIVE  LEUKOCYTESUR LARGE*    Micro Results: Recent Results (from the past 240 hour(s))  URINE CULTURE     Status: None   Collection Time    10/23/13 10:55 PM      Result Value Ref Range Status   Specimen Description URINE, RANDOM   Final   Special Requests NONE   Final   Culture  Setup Time     Final   Value: 10/24/2013 09:26     Performed at [facility]     Final   Value: 9,000 COLONIES/ML     Performed at [facility]   Culture     Final   Value: INSIGNIFICANT GROWTH     Performed at [facility]   Report Status 10/25/2013 FINAL   Final  Medications: I have reviewed the patient's current  medications. Scheduled Meds: . antiseptic oral rinse  15 mL Mouth Rinse q12n4p  . chlorhexidine  15 mL Mouth Rinse BID  . enoxaparin (LOVENOX) injection  40 mg Subcutaneous Q24H  . lisinopril  40 mg Oral Daily   And  . hydrochlorothiazide  25 mg Oral Daily  . insulin aspart  0-9 Units Subcutaneous 6 times per day  . lipase/protease/amylase  1 capsule Oral TID AC  . pantoprazole (PROTONIX) IV  40 mg Intravenous Q24H   Continuous Infusions: . dextrose 5 % and 0.45% NaCl 75 mL/hr at 10/29/13 0022   PRN Meds:.acetaminophen, promethazine Assessment/Plan: LUQ pain: Acute on chronic. Likely from constipation. R/o for pancreatitis and PUD. Talked to Dr. Roney Mans, Gastroenterologist at [facility], and recs- try Linzess and Creon and pt should follow up with Dr. Claudette Laws on discharge. - No opioid pain meds. -- phenergan 12.5mg  PO q6h PRN for nausea - D/c home on protonix 20mg  daily. - Creon 1 tablet with meals, pt likle thi smedication, feel sit helps, but cannot  afford, it, care manager says there is no cheaper alternative.  DM type 2: HgbA1c 7.03 August 2013. CBG in  <200.  - Cont hom emeds- metformin on discharge.  - SSI sensitive   HTN: Acceptable range today- 138/73 this morning.  - Cont home BP meds- lisinopril-HCTZ- 40-25 daily.  Diet: Started and tolerated diet today. D/c home today.  VTE ppx: Lovenox   Code: Full   Dispo: Disposition is deferred at this time, awaiting improvement of current medical problems. Anticipated discharge in approximately 1-2 day(s).   The patient does have a current PCP (Jenetta Downer, MD) and does need an [facility] hospital follow-up appointment after discharge  The patient does not have transportation limitations that hinder transportation to clinic appointments.  .Services Needed at time of discharge: Y = Yes, Blank = No PT:   OT:   RN:   Equipment:   Other:     LOS: 6 days   Jenetta Downer, MD 10/29/2013, 3:53 PM

## 2013-10-29 NOTE — Discharge Summary (Signed)
Name: [name] MRN: [id number] DOB: 01/27/1956 58 y.o. PCP: Jenetta Downer, MD  Date of Admission: 10/23/2013  4:10 PM Date of Discharge: 11/01/2013 Attending Physician: No att. providers found  Discharge Diagnosis: Principal Problem:   Chronic abdominal pain Active Problems:   DIABETES MELLITUS, TYPE II   HYPERTENSION   GERD   PANCREAS DIVISUM   Abdominal pain   Chronic pancreatitis  Discharge Medications:   Medication List    STOP taking these medications       ferrous sulfate 325 (65 FE) MG tablet     Oxycodone HCl 10 MG Tabs      TAKE these medications       lipase/protease/amylase 12000 UNITS Cpep capsule  Commonly known as:  CREON-12/PANCREASE  Take 1 capsule by mouth 3 (three) times daily before meals.     lisinopril-hydrochlorothiazide 20-12.5 MG per tablet  Commonly known as:  PRINZIDE,ZESTORETIC  Take 2 tablets by mouth daily.     metFORMIN 1000 MG tablet  Commonly known as:  GLUCOPHAGE  Take 1,000 mg by mouth 2 (two) times daily with a meal.     pantoprazole 20 MG tablet  Commonly known as:  PROTONIX  Take 1 tablet (20 mg total) by mouth daily.     promethazine 12.5 MG tablet  Commonly known as:  PHENERGAN  Take 1 tablet (12.5 mg total) by mouth every 6 (six) hours as needed for nausea.     senna-docusate 8.6-50 MG per tablet  Commonly known as:  Senokot-S  Take 2 tablets by mouth at bedtime.        Disposition and follow-up:   [name] was discharged from [facility] in Good condition.  At the hospital follow up visit please address:  1.  Abdominal pain resolved? Follow up with Gastroenterologist.   2.  Labs / imaging needed at time of follow-up: None  3.  Pending labs/ test needing follow-up: None  Follow-up Appointments: Follow-up Information   Follow up with [facility]. Schedule an appointment as soon as possible for a visit on 12/04/2013.   Specialty:  Internal Medicine   Contact  information:   [place] [address] [phone]       Discharge Instructions: Discharge Instructions   Call MD for:  severe uncontrolled pain    Complete by:  As directed      Diet - low sodium heart healthy    Complete by:  As directed      Discharge instructions    Complete by:  As directed   We couldn't get a cheaper alternative for you for Creon. Please follow up with your GI doctor at [facility] as soon as you can.   For now it is advisable to stop the oxycodone pain meds and iron tablets, as we think these contributed to your pain.   Continue with sennakot as prescribed to prevent constipation. Take 2  tablet daily.     Increase activity slowly    Complete by:  As directed            Consultations:  Gastroenterology.  Procedures Performed:  Ct Abdomen Pelvis W Contrast  10/23/2013   CLINICAL DATA:  Epigastric abdominal pain.  EXAM: CT ABDOMEN AND PELVIS WITH CONTRAST  TECHNIQUE: Multidetector CT imaging of the abdomen and pelvis was performed using the standard protocol following bolus administration of intravenous contrast.  CONTRAST:  36mL OMNIPAQUE IOHEXOL 300 MG/ML SOLN, 176mL OMNIPAQUE IOHEXOL  300 MG/ML SOLN  COMPARISON:  03/18/2011.  FINDINGS: The lung bases are clear. No pleural effusion or pulmonary lesion. The heart is normal in size. No pericardial effusion. A small hiatal hernia is noted.  The liver is unremarkable. No focal hepatic lesions or intrahepatic biliary dilatation. The gallbladder is normal. No common bile duct dilatation. Incidental note is made of pancreatic divisum. The pancreas is normal. No findings for acute pancreatitis. The spleen is normal in size. No focal lesions. The adrenal glands and kidneys are unremarkable.  The stomach, duodenum, small bowel and colon are unremarkable. No inflammatory changes, mass lesions or obstructive findings. There is a large amount of stool throughout the colon suggesting  constipation.  No mesenteric or retroperitoneal mass or adenopathy. Small scattered lymph nodes are noted. The aorta and branch vessels are patent. The major venous structures are patent.  The uterus is surgically absent. No pelvic mass or adenopathy. The bladder is mildly distended. No inguinal mass or adenopathy.  The bony structures are unremarkable.  IMPRESSION: No CT findings for acute pancreatitis.  Pancreatic divisum is noted.  No acute abdominal/ pelvic findings, mass lesions or adenopathy.  Large amount of stool throughout the colon suggesting constipation   Electronically Signed   By: Kalman Jewels M.D.   On: 10/23/2013 19:19   Dg Abd 2 Views  10/27/2013   CLINICAL DATA:  Upper abdominal pain.  Nausea.  Diarrhea.  EXAM: ABDOMEN - 2 VIEW  COMPARISON:  CT 10/23/2013.  FINDINGS: Minimal atelectasis within the left lung base. Mild gaseous distention of the small bowel. Gas and stool demonstrated within the colon. Multiple soft tissue calcifications. Regional skeleton unremarkable.  IMPRESSION: Mild gaseous distention of the small bowel within the central abdomen. Distal gas within the colon. Findings may represent ileus.   Electronically Signed   By: Lovey Newcomer M.D.   On: 10/27/2013 13:54    2D Echo: None  Cardiac Cath: None  Admission HPI: Chief Complaint: abdominal pain   History of Present Illness: 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 She presents with a three day c/o nausea and abdominal pain. Pain is 7/10, located in the LUQ and radiates to her back. The pain is worse with eating so she tried to eat a bland diet. She is unable to find a position of comfort. The pain did not respond to her usual oxycontin and she was nauseous despite phenergan, although she did not vomit. She also feels bloated and had several episodes of diarrhea yesterday but none today. She denies any new  foods, environmental exposures or sick contacts. She denies hx of gallstones, recent EtOH use or hx of EtOH abuse. She went to a previously scheduled appointment with her gastroenterologist at [facility] today and he was concerned she was suffering from acute pancreatitis and recommended admission for CT and pain/nausea control. She lives in [place] and elected to come to a local ED.  In the ED: T 98.28F, RR 16, HR 80, SpO2 98% on RA, BP 144/44mmHg; she received 1 mg Dilaudid, 4mg  Zofran and 1L NSS bolus.   Physical Exam:  Blood pressure 161/84, pulse 86, temperature 98.3 F (36.8 C), temperature source Oral, resp. rate 16, SpO2 99.00%.  General: resting in bed in NAD, pleasant and cooperative with exam  HEENT: PERRL, EOMI, oropharynx moist  Cardiac: RRR, no rubs, murmurs or gallops  Pulm: clear to auscultation bilaterally,  moving normal volumes of air  Abd: soft, nondistended, BS present all four quadrants, LUQ TTP  Ext: warm and well perfused, no pedal edema, 2+ DPs  Neuro: alert and oriented X3, cranial nerves II-XII grossly intact, strength and sensation intact upper and lower extremities   Hospital Course by problem list: Principal Problem:   Chronic abdominal pain Active Problems:   DIABETES MELLITUS, TYPE II   HYPERTENSION   GERD   PANCREAS DIVISUM   Abdominal pain   Chronic pancreatitis   Left upper quadrant pain- Exact etiology not identified despite extensive work up, but was thought to be related to large stool burden. Prior to admission pt had been on chronic opioids- Oxycodone- 10mg  Q8H given at a pain clinic. Initially concern for Acute pancreatitis flare, considering pts hx of chronic pancreatitis 2/2 to pancreatic divisum, for which she follows with a Gastroenterologist at [facility]. Lipase was WNL, and  Ct Abd/Pelvis W contrast not suggestive of acute pancreatitis but showed large amount of stool throughout the colon. Lactic acid WNL. GI was consulted while in-pt, and recs were for  EGD, which showed-Mild antral gastritis, supported by pathology findings. Recs by GI for PPI and follow up with pts Gastroenterologist at [facility]. Pt had 2 tap water enema, with passage of large amount of stool, with some relief of abdominal pain. Pt was placed on bowel rest, with IVF D5 1/2 N/s. Pain meds- Opioids were held, and other constipating medications were held. Pt was given Toradol IM for pain. Abdominal Xray was done and showed feats consistent with ileus. Pt was given a SMOG enema, with large BM, and reduction in severity of pain. Pts Gastroenterologist was consulted Via Phone and recs were to start Creon. Pt said she likes the medication as she has had it in the past, thinks it helps but cannot afford it. Pts care manager said there is no cheaper alternative or option so pt gets it at a reduced price. On discharge pt reported improvement in symptoms and was tolerating oral diet. Pt to continue Sennakot-s 2 tabs daily.  DM type 2:  Last HgbA1c 7.03 August 2013. SSI on admission. Home meds metformin held on admission, restarted on discharge.  HTN: Stable through out admission. Home meds- Lisinopril-HCTZ  20-12.5 daily continued on admission and on discharge.   Discharge Vitals:   BP 138/73  Pulse 95  Temp(Src) 98.9 F (37.2 C) (Oral)  Resp 16  Ht 5' (1.524 m)  Wt 125 lb 3.2 oz (56.79 kg)  BMI 24.45 kg/m2  SpO2 98%  Discharge Labs:  No results found for this or any previous visit (from the past 24 hour(s)).  Signed: Jenetta Downer, MD 11/01/2013, 5:25 PM    Services Ordered on Discharge: None. Equipment Ordered on Discharge: None.

## 2013-11-04 ENCOUNTER — Encounter: Payer: Self-pay | Admitting: Internal Medicine

## 2013-11-05 NOTE — Discharge Summary (Signed)
INTERNAL MEDICINE ATTENDING DISCHARGE COSIGN   I evaluated the patient on the day of discharge and discussed the discharge plan with my resident team. I agree with the discharge documentation and disposition.   [place], [place] 11/05/2013, 8:29 AM

## 2013-11-17 ENCOUNTER — Other Ambulatory Visit: Payer: Self-pay | Admitting: Internal Medicine

## 2013-11-26 ENCOUNTER — Ambulatory Visit (HOSPITAL_COMMUNITY)
Admission: RE | Admit: 2013-11-26 | Discharge: 2013-11-26 | Disposition: A | Discharge status: Home / Self Care | Payer: BC Managed Care – PPO | Source: Ambulatory Visit | Attending: Internal Medicine | Admitting: Internal Medicine

## 2013-11-26 ENCOUNTER — Ambulatory Visit (INDEPENDENT_AMBULATORY_CARE_PROVIDER_SITE_OTHER): Payer: BC Managed Care – PPO | Admitting: Internal Medicine

## 2013-11-26 ENCOUNTER — Encounter: Payer: Self-pay | Admitting: Internal Medicine

## 2013-11-26 VITALS — BP 138/82 | HR 77 | Temp 97.3°F | Ht 60.0 in | Wt 116.8 lb

## 2013-11-26 DIAGNOSIS — M25519 Pain in unspecified shoulder: Secondary | ICD-10-CM

## 2013-11-26 DIAGNOSIS — K861 Other chronic pancreatitis: Secondary | ICD-10-CM | POA: Diagnosis not present

## 2013-11-26 DIAGNOSIS — I10 Essential (primary) hypertension: Secondary | ICD-10-CM | POA: Diagnosis not present

## 2013-11-26 DIAGNOSIS — E119 Type 2 diabetes mellitus without complications: Secondary | ICD-10-CM

## 2013-11-26 DIAGNOSIS — E785 Hyperlipidemia, unspecified: Secondary | ICD-10-CM | POA: Diagnosis not present

## 2013-11-26 DIAGNOSIS — M25512 Pain in left shoulder: Secondary | ICD-10-CM

## 2013-11-26 LAB — GLUCOSE, CAPILLARY: Glucose-Capillary: 167 mg/dL — ABNORMAL HIGH (ref 70–99)

## 2013-11-26 LAB — POCT GLYCOSYLATED HEMOGLOBIN (HGB A1C): Hemoglobin A1C: 7.4

## 2013-11-26 MED ORDER — PRAVASTATIN SODIUM 40 MG PO TABS: 40.0000 mg | ORAL_TABLET | Freq: Every day | ORAL | Status: DC

## 2013-11-26 NOTE — Progress Notes (Signed)
Patient ID: [name], female   DOB: 1955-05-12, 58 y.o.   MRN: [id number]   Subjective:   Patient ID: [name] female   DOB: [birth year] 58 y.o.   MRN: [id number]  HPI: [name] is a 58 y.o. with PMH of HTN, DM2, chronic pancreatitis, and HLD. Presented today for follow up visit, with c/o left shoulder pain, started 5 days ago, and thinks its gotten a little worse, started in the morning after she woke up from sleep, increased pain with movement. Pt says it feels like she got an injection into her arm, like a flu shot, though she denies having this, no falls, no fever, no joint swelling or rash. No relief with aspercreme but has tried warm compresses with reilef. Has not used OTC meds.  Past Medical History  Diagnosis Date  . Hypertension   . HLD (hyperlipidemia) 2009  . Sickle cell trait   . Pulmonary embolus 08/2004    Two areas of V/Q mismatch. Findings compatible  with high probability for pulmonary embolus.; pt was on coumadin for 1 year  . Pancreatic divisum     S/P ERCP with stenting 07/2010 at [facility], then stent removal (08/05/2010)  . CVA (cerebral infarction) 1993    Per report by patient she had a stroke and prolonged rehab course eventually resolving her right sided weakness, as per her hx obtained 08/2007  . Blood dyscrasia     thalasemia, sickle cell trait  . Chronic abdominal pain     Unclear etiology, thought to be due to chronic pancreatitis previously in setting of her pancreatic divisum - however, EUS and EGD performed at [facility]  (10/05/2011) showing normal esophageall, gastric, duodenal mucosa. EUS showing no pancreatic masses, cysts, or changes of chronic pancreatitis. Biliary system nondilated and had no endosonographic abnormalities.  . Type II diabetes mellitus 2010    well controlled  . Asthma     childhood asthma  . Uterine cancer   . Pneumonia ~ 1969  . Beta thalassemia   . Thalassemia   . Migraine headache     "q  other year now" (11/20/2012)  . Seizures     "after the stroke in 1993; none for years now" 11/20/2012)  . Stroke 1993    very small amount of left sided weakness   Current Outpatient Prescriptions  Medication Sig Dispense Refill  . lipase/protease/amylase (CREON-12/PANCREASE) 12000 UNITS CPEP capsule Take 1 capsule by mouth 3 (three) times daily before meals.  90 capsule  0  . lisinopril-hydrochlorothiazide (PRINZIDE,ZESTORETIC) 20-12.5 MG per tablet Take 2 tablets by mouth daily.  60 tablet  2  . metFORMIN (GLUCOPHAGE) 1000 MG tablet Take 1,000 mg by mouth 2 (two) times daily with a meal.      . pantoprazole (PROTONIX) 20 MG tablet Take 1 tablet (20 mg total) by mouth daily.  30 tablet  0  . promethazine (PHENERGAN) 12.5 MG tablet TAKE 1 TABLET (12.5 MG TOTAL) BY MOUTH EVERY 6 (SIX) HOURS AS NEEDED FOR NAUSEA.  60 tablet  1  . senna-docusate (SENOKOT-S) 8.6-50 MG per tablet Take 2 tablets by mouth at bedtime.  30 tablet  1   No current facility-administered medications for this visit.   Family History  Problem Relation Age of Onset  . Prostate cancer Father   . Cancer Father     stomach ca died x 1 year ago  . Sickle cell anemia Brother   . Lung cancer Maternal Aunt   . Lung cancer Maternal Uncle   .  Breast cancer Maternal Grandmother   . Heart disease Maternal Grandfather   . Heart disease Paternal Grandfather   . Diabetes Other   . Cancer Other     multiple cancers pancreas,colon, breast   History   Social History  . Marital Status: Married    Spouse Name: N/A    Number of Children: N/A  . Years of Education: 74   Social History Main Topics  . Smoking status: Former Smoker -- 0.50 packs/day for 30 years    Types: Cigarettes    Quit date: 01/01/1999  . Smokeless tobacco: Never Used  . Alcohol Use: No  . Drug Use: No  . Sexual Activity: Yes   Other Topics Concern  . Not on file   Social History Narrative   Works at [employer] as a Librarian, academic, on Retail banker about 9  hours/ day   Review of Systems: CONSTITUTIONAL- No Fever, weightloss, night sweat or change in appetite. SKIN- No Rash, colour changes or itching. HEAD- No Headache or dizziness. EYES- No Vision loss, pain, redness, double or blurred vision. EARS- No vertigo, hearing loss or ear discharge. Mouth/throat- No Sorethroat, dentures, or bleeding gums. RESPIRATORY- No Cough or SOB. CARDIAC- No Palpitations, DOE, PND or chest pain. GI- No nausea, vomiting, diarrhoea, constipation, abd pain. URINARY- No Frequency, urgency, straining or dysuria. NEUROLOGIC- No Numbness, syncope, seizures or burning. [facility]- Denies depression or anxiety.  Objective:  Physical Exam: There were no vitals filed for this visit. GENERAL- alert, co-operative, appears as stated age, not in any distress. HEENT- Atraumatic, normocephalic, PERRL, EOMI, oral mucosa appears moist,  neck supple. CARDIAC- RRR, no murmurs, rubs or gallops. RESP- Moving equal volumes of air, and clear to auscultation bilaterally, no wheezes or crackles. ABDOMEN- Soft, full- normal for pt, nontender, no guarding or rebound, no palpable masses or organomegaly, bowel sounds present. NEURO- No obvious Cr N abnormality, strenght upper and lower extremities- 5/5,  Gait- Normal. EXTREMITIES- pulse 2+, symmetric, no pedal edema, left shoulder tenderness, range of motion restricted by pain, otherwise, normal range of motion, negative drop arm test. No erythema, swelling or warmth. SKIN- Warm, dry, No rash or lesion. PSYCH- Normal mood and affect, appropriate thought content and speech.  Assessment & Plan:  The patient's case and plan of care was discussed with attending physician, Dr. Orie Fisherman.  Shoulder pain- Left, Most likely musculoskeletal from Muscle sprain.  - Rest, pt says she will rather use warm compresses than ice since she gets relief.   - Shoulder xray. - If no relief, with increasing pain after 2 weeks, told pt to call and schedule an  appointment   Please see problem based charting for assessment and plan for pts chronic medical conditions.

## 2013-11-26 NOTE — Assessment & Plan Note (Signed)
Lab Results  Component Value Date   HGBA1C 7.4 11/26/2013   HGBA1C 7.4 07/31/2013   HGBA1C 6.6 11/20/2012     Assessment: Diabetes control:  Controlled Progress toward A1C goal:   At goal Comments: Compliant with Metformin 1000mg  BID.  Plan: Medications:  continue current medications Instruction/counseling given: Discussed diet.

## 2013-11-26 NOTE — Assessment & Plan Note (Signed)
BP Readings from Last 3 Encounters:  11/26/13 138/82  10/29/13 138/73  10/29/13 138/73    Lab Results  Component Value Date   NA 143 10/28/2013   K 3.7 10/28/2013   CREATININE 0.53 10/28/2013    Assessment: Blood pressure control:  Controlled Progress toward BP goal:   At goal          Comments: Complaint with lisinopril- HCTZ 40-25mg  daily  Plan: Medications:  continue current medications Educational resources provided:  None Self management tools provided:  None

## 2013-11-26 NOTE — Patient Instructions (Signed)
Shoulder Pain The shoulder is the joint that connects your arms to your body. The bones that form the shoulder joint include the upper arm bone (humerus), the shoulder blade (scapula), and the collarbone (clavicle). The top of the humerus is shaped like a ball and fits into a rather flat socket on the scapula (glenoid cavity). A combination of muscles and strong, fibrous tissues that connect muscles to bones (tendons) support your shoulder joint and hold the ball in the socket. Small, fluid-filled sacs (bursae) are located in different areas of the joint. They act as cushions between the bones and the overlying soft tissues and help reduce friction between the gliding tendons and the bone as you move your arm. Your shoulder joint allows a wide range of motion in your arm. This range of motion allows you to do things like scratch your back or throw a ball. However, this range of motion also makes your shoulder more prone to pain from overuse and injury. Causes of shoulder pain can originate from both injury and overuse and usually can be grouped in the following four categories:  Redness, swelling, and pain (inflammation) of the tendon (tendinitis) or the bursae (bursitis).  Instability, such as a dislocation of the joint.  Inflammation of the joint (arthritis).  Broken bone (fracture). HOME CARE INSTRUCTIONS   Apply ice to the sore area.  Put ice in a plastic bag.  Place a towel between your skin and the bag.  Leave the ice on for 15-20 minutes, 3-4 times per day for the first 2 days, or as directed by your health care provider.  Stop using cold packs if they do not help with the pain.  If you have a shoulder sling or immobilizer, wear it as long as your caregiver instructs. Only remove it to shower or bathe. Move your arm as little as possible, but keep your hand moving to prevent swelling.  Squeeze a soft ball or foam pad as much as possible to help prevent swelling.  Only take  over-the-counter or prescription medicines for pain, discomfort, or fever as directed by your caregiver. SEEK MEDICAL CARE IF:   Your shoulder pain increases, or new pain develops in your arm, hand, or fingers.  Your hand or fingers become cold and numb.  Your pain is not relieved with medicines. SEEK IMMEDIATE MEDICAL CARE IF:   Your arm, hand, or fingers are numb or tingling.  Your arm, hand, or fingers are significantly swollen or turn white or blue. MAKE SURE YOU:   Understand these instructions.  Will watch your condition.  Will get help right away if you are not doing well or get worse.

## 2013-11-26 NOTE — Assessment & Plan Note (Addendum)
Stable. No pain today. Has an appointment with her gastroenterologist at [facility] in September 2015. Was admitted in June for abdominal pain, which was thought to be due to fecal impaction, with increased stool burden seen on Ct, with ileus on Abd Xray.  Pt reports improvement in her bowel habits since taking sennakot daily, without recurrence of abdominal pain since discharge.

## 2013-11-26 NOTE — Assessment & Plan Note (Signed)
LDL cholesterol elevated- 143- 07/2013, no change from 146- 2014. Pt not on statin. Was previously on pravastatin, pt elected not to be on a cholesterol medication in the past, and wanted to try diet modification.  Plan- Counselled pt extensively about the advantages of starting a statin. Pt says she will will to try. - Pravastatin- 40mg  daily.

## 2013-11-27 NOTE — Progress Notes (Signed)
INTERNAL MEDICINE TEACHING ATTENDING ADDENDUM - Edwyna Dangerfield, MD: I reviewed and discussed at the time of visit with the resident Dr. Emokpae, the patient's medical history, physical examination, diagnosis and results of pertinent tests and treatment and I agree with the patient's care as documented.  

## 2013-12-17 ENCOUNTER — Telehealth: Payer: Self-pay | Admitting: *Deleted

## 2013-12-17 ENCOUNTER — Other Ambulatory Visit: Payer: Self-pay | Admitting: *Deleted

## 2013-12-17 MED ORDER — PANCRELIPASE (LIP-PROT-AMYL) 12000-38000 UNITS PO CPEP: 12000.0000 [IU] | ORAL_CAPSULE | Freq: Three times a day (TID) | ORAL | Status: DC

## 2013-12-17 MED ORDER — SENNOSIDES-DOCUSATE SODIUM 8.6-50 MG PO TABS: 2.0000 | ORAL_TABLET | Freq: Every day | ORAL | Status: DC

## 2013-12-17 NOTE — Telephone Encounter (Signed)
Still having pain on and off.  Saw Dr Denton Brick end of July 2015. Appt made. Hilda Blades Jaonna Word RN 12/17/13 12N

## 2013-12-20 ENCOUNTER — Ambulatory Visit: Payer: BC Managed Care – PPO | Admitting: Internal Medicine

## 2013-12-21 ENCOUNTER — Other Ambulatory Visit (HOSPITAL_COMMUNITY): Payer: Self-pay | Admitting: Internal Medicine

## 2013-12-23 ENCOUNTER — Telehealth: Payer: Self-pay | Admitting: Dietician

## 2013-12-26 NOTE — Telephone Encounter (Signed)
Agrees to eye exam when she reschedules her appointment

## 2014-01-16 ENCOUNTER — Ambulatory Visit (INDEPENDENT_AMBULATORY_CARE_PROVIDER_SITE_OTHER): Payer: BC Managed Care – PPO | Admitting: Internal Medicine

## 2014-01-16 VITALS — BP 130/71 | HR 77 | Temp 98.0°F | Ht 60.0 in | Wt 103.9 lb

## 2014-01-16 DIAGNOSIS — Z0000 Encounter for general adult medical examination without abnormal findings: Secondary | ICD-10-CM | POA: Diagnosis not present

## 2014-01-16 DIAGNOSIS — M7712 Lateral epicondylitis, left elbow: Secondary | ICD-10-CM | POA: Insufficient documentation

## 2014-01-16 DIAGNOSIS — Z23 Encounter for immunization: Secondary | ICD-10-CM | POA: Diagnosis not present

## 2014-01-16 DIAGNOSIS — M7710 Lateral epicondylitis, unspecified elbow: Secondary | ICD-10-CM

## 2014-01-16 MED ORDER — PROMETHAZINE HCL 12.5 MG PO TABS: ORAL_TABLET | ORAL | Status: DC

## 2014-01-16 MED ORDER — IBUPROFEN 800 MG PO TABS: 800.0000 mg | ORAL_TABLET | Freq: Three times a day (TID) | ORAL | Status: DC | PRN

## 2014-01-16 NOTE — Assessment & Plan Note (Signed)
Most likely cause of pts tingling and numbness down the lateral side of her forearm dorsally to fingers. Provoked by palpation, but not by flexion or extension of wrist against resistance with elbow extended.  Differential- osteoarthritis.  Plan- NSAIDS- ibuprofen- 800mg  TID X7 days, will also help bruised forearm. - Cold compress, but pt says she prefers Warm compress, as this provides relief. - Consider imaging- Xray to rule out arthritis if persistent, also brace, physical therapy. Pt might require referral to sports medicine for steroid injections if not resolved or if pain gets worse.

## 2014-01-16 NOTE — Patient Instructions (Addendum)
General Instructions:  I want you to take a Ibuprofen, the one I will prescribe has a stronger strength than over the counter.   Take one tablet 3 times a day for 1 week. Please always take with meals. If you aren't going to eat do not take it.  I also think your muscle is inflamed. The ibuprofen will help with the pain in you arm and the tingling sensation you feel in you hand. Physical therapy and brace for your arm might also help, we could include this later.  The feeling in your arm can take up to a year to resolve. If it become worse we might need to do some Xrays and give you some injections into your arm.  What you have is called tennis elbow or lateral Epicondylitis. You do not have to be a tennis player or play golf to have it.  I will also like you to get your eye exam done today. Also schedule an appoitment to get a pap smear next visit.   Please bring your medicines with you each time you come to clinic.  Medicines may include prescription medications, over-the-counter medications, herbal remedies, eye drops, vitamins, or other pills.  Tennis Elbow Your caregiver has diagnosed you with a condition often referred to as "tennis elbow." This results from small tears or soreness (inflammation) at the start (origin) of the extensor muscles of the forearm. Although the condition is often called tennis or golfer's elbow, it is caused by any repetitive action performed by your elbow. HOME CARE INSTRUCTIONS  If the condition has been short lived, rest may be the only treatment required. Using your opposite hand or arm to perform the task may help. Even changing your grip may help rest the extremity. These may even prevent the condition from recurring.  Longer standing problems, however, will often be relieved faster by:  Using anti-inflammatory agents.  Applying ice packs for 30 minutes at the end of the working day, at bed time, or when activities are finished.  Your caregiver may  also have you wear a splint or sling. This will allow the inflamed tendon to heal. At times, steroid injections aided with a local anesthetic will be required along with splinting for 1 to 2 weeks. Two to three steroid injections will often solve the problem. In some long standing cases, the inflamed tendon does not respond to conservative (non-surgical) therapy. Then surgery may be required to repair it. MAKE SURE YOU:   Understand these instructions.  Will watch your condition.  Will get help right away if you are not doing well or get worse.

## 2014-01-16 NOTE — Progress Notes (Signed)
Patient ID: [name], female   DOB: 03/11/1956, 58 y.o.   MRN: [id number]   Subjective:   Patient ID: [name] female   DOB: 1955-07-14 58 y.o.   MRN: [id number]  HPI: [name] is a 58 y.o. with PMH listed below. Presented today with left upper arm pain, present for the past month. Pt is located posteriorly on the upper to mid third of her forearm. Pt says she is not sure but she thinks she hit it one night while sleeping, against the dresser. No pain on her other part of her body. No fever, or rash. Pt also say she is having pain on the other side of her elbow- left upper extremity also, and that from that point she feels radiating tingling sensation down her forearm on the dorsal surface without significant weakness, especially when she lies on that side, she says that some times her arm goes to sleep. Pt does several activities with her arms, but doesn't play games, tennis or golf.  Pt also says she lost her nephew from sickle cell crisis- 4 weeks ago, she has not been eating and today is the first day she ate a real meal. Other wise she has been eating mostly fruits only. Pt has lost 13 pounds since her last visit. Pt say she is just now recovering from her nephews sudden death.  Past Medical History  Diagnosis Date  . Hypertension   . HLD (hyperlipidemia) 2009  . Sickle cell trait   . Pulmonary embolus 08/2004    Two areas of V/Q mismatch. Findings compatible  with high probability for pulmonary embolus.; pt was on coumadin for 1 year  . Pancreatic divisum     S/P ERCP with stenting 07/2010 at [facility], then stent removal (08/05/2010)  . CVA (cerebral infarction) 1993    Per report by patient she had a stroke and prolonged rehab course eventually resolving her right sided weakness, as per her hx obtained 08/2007  . Blood dyscrasia     thalasemia, sickle cell trait  . Chronic abdominal pain     Unclear etiology, thought to be due to chronic pancreatitis  previously in setting of her pancreatic divisum - however, EUS and EGD performed at [place]  (10/05/2011) showing normal esophageall, gastric, duodenal mucosa. EUS showing no pancreatic masses, cysts, or changes of chronic pancreatitis. Biliary system nondilated and had no endosonographic abnormalities.  . Type II diabetes mellitus 2010    well controlled  . Asthma     childhood asthma  . Uterine cancer   . Pneumonia ~ 1969  . Beta thalassemia   . Thalassemia   . Migraine headache     "q other year now" (11/20/2012)  . Seizures     "after the stroke in 1993; none for years now" 11/20/2012)  . Stroke 1993    very small amount of left sided weakness   Current Outpatient Prescriptions  Medication Sig Dispense Refill  . lipase/protease/amylase (CREON) 12000 UNITS CPEP capsule Take 1 capsule (12,000 Units total) by mouth 3 (three) times daily before meals.  90 capsule  1  . lisinopril-hydrochlorothiazide (PRINZIDE,ZESTORETIC) 20-12.5 MG per tablet Take 2 tablets by mouth daily.  60 tablet  2  . metFORMIN (GLUCOPHAGE) 1000 MG tablet Take 1,000 mg by mouth 2 (two) times daily with a meal.      . pantoprazole (PROTONIX) 20 MG tablet TAKE 1 TABLET BY MOUTH EVERY DAY  30 tablet  1  . pravastatin (PRAVACHOL) 40 MG tablet  Take 1 tablet (40 mg total) by mouth daily.  30 tablet  3  . promethazine (PHENERGAN) 12.5 MG tablet TAKE 1 TABLET (12.5 MG TOTAL) BY MOUTH EVERY 6 (SIX) HOURS AS NEEDED FOR NAUSEA.  60 tablet  1  . senna-docusate (SENOKOT-S) 8.6-50 MG per tablet Take 2 tablets by mouth at bedtime.  30 tablet  3   No current facility-administered medications for this visit.   Family History  Problem Relation Age of Onset  . Prostate cancer Father   . Cancer Father     stomach ca died x 1 year ago  . Sickle cell anemia Brother   . Lung cancer Maternal Aunt   . Lung cancer Maternal Uncle   . Breast cancer Maternal Grandmother   . Heart disease Maternal Grandfather   . Heart disease Paternal  Grandfather   . Diabetes Other   . Cancer Other     multiple cancers pancreas,colon, breast   History   Social History  . Marital Status: Married    Spouse Name: N/A    Number of Children: N/A  . Years of Education: 15   Social History Main Topics  . Smoking status: Former Smoker -- 0.50 packs/day for 30 years    Types: Cigarettes    Quit date: 01/01/1999  . Smokeless tobacco: Never Used  . Alcohol Use: No  . Drug Use: No  . Sexual Activity: Yes   Other Topics Concern  . Not on file   Social History Narrative   Works at [employer] as a Librarian, academic, on Retail banker about 9 hours/ day   Review of Systems: CONSTITUTIONAL- No Fever, weightloss, night sweat or change in appetite. SKIN- No Rash, colour changes or itching. HEAD- No Headache or dizziness. RESPIRATORY- No Cough or SOB. CARDIAC- No Palpitations, DOE, PND or chest pain. GI- No nausea, vomiting, diarrhoea, constipation, abd pain. URINARY- No Frequency, urgency, straining or dysuria. NEUROLOGIC- No Numbness, syncope, seizures or burning. [facility]- Denies depression or anxiety.  Objective:  Physical Exam: Filed Vitals:   01/16/14 1535  BP: 130/71  Pulse: 77  Temp: 98 F (36.7 C)  TempSrc: Oral  Height: 5' (1.524 m)  Weight: 103 lb 14.4 oz (47.129 kg)  SpO2: 100%   GENERAL- alert, co-operative, appears as stated age, not in any distress. HEENT- Atraumatic, normocephalic, PERRL, EOMI, oral mucosa appears moist, poor dentition overall, neck supple. CARDIAC- RRR, no murmurs, rubs or gallops. RESP- Moving equal volumes of air, and clear to auscultation bilaterally, no wheezes or crackles. ABDOMEN- Soft, nontender, no guarding or rebound, no palpable masses or organomegaly, bowel sounds present. BACK- Normal curvature of the spine, No tenderness along the vertebrae, no CVA tenderness. NEURO- No obvious Cr N abnormality, strenght upper and lower extremities- Gait- Normal. EXTREMITIES- pulse 2+, symmetric, no pedal  edema. Left upper extrem- tenderness on palpation of mid-upper third of arm posterior, no bruising or redness, pain aggrav by movement of the arm- especially extension of the shoulder joint against resistance. Left elbow- tingling sensation and pain suddenly provoked on palpation of the lateral epicondyle, but pain not provoked by extension or flexion of the wrist against resistance with elbow extended. Normal range of motion off shoulder and wrist. Right upper extrem- normal. SKIN- Warm, dry, No rash or lesion. PSYCH- Normal mood and affect, appropriate thought content and speech.  Assessment & Plan:   The patient's case and plan of care was discussed with attending physician, Dr. Dareen Piano.  Please see problem based charting for assessment  and plan.

## 2014-01-16 NOTE — Assessment & Plan Note (Signed)
Notes from 11/26/2013- By Bing Neighbors, MD.  Stable. No pain today. Has an appointment with her gastroenterologist at [facility] in September 2015. Was admitted in June for abdominal pain, which was thought to be due to fecal impaction, with increased stool burden seen on Ct, with ileus on Abd Xray.  Pt reports improvement in her bowel habits since taking sennakot daily, without recurrence of abdominal pain since discharge.      Notes from 10/01/2013- By Bing Neighbors, MD.  Follows with a gastroenterologist at [place]. Today has mild abdominal tenderness and nausea. Also complaints of occasional bloating. No change from baseline as per pt. Asks for refill of phenegan today.  Plan- Continue Creon.  - Refill phenegan.

## 2014-01-16 NOTE — Assessment & Plan Note (Signed)
Notes by Annamarie Dawley, DO- 10/20/2011 office visit.  Assessment:  The patient has chronic abdominal pain of unclear etiology. She signed a pain contract with me in August 2012, at which time she was explicitly explained that she would not be allowed to receive narcotic pain medications as an outpatient from any other providers outside of [facility]. There have been multiple infractions of the pain contract since its initiation. The patient is insistent that she did not realize she violated her pain contract. Because of the sensitivity of the matter, I reviewed all infractions with Dr. Lynnae January. She and I agree that there has been multiple infractions of the pain contract, and red flag behavior.  Infractions as below:  August 2012 - UDS was negative for opiates despite the patient stating that she took Vicodin on the day of evaluation - this was thought likely because total opiates were not ordered, therefore the patient continued to receive her context from her clinic.  November 2012 - patient received narcotics from outside facility, and did not inform [facility].  April 2013 - patient's UDS indicated no hydrocodone or hydromorphone, despite the patient stating that she took this medication one day prior to evaluation.  Plan:  The patient will no longer be prescribed narcotics from [facility] - this was explained to the patient by both Dr. Lynnae January and myself.  FYI he was placed indicating controlled medication caution.

## 2014-01-16 NOTE — Assessment & Plan Note (Signed)
Pt says has had a total hysterectomy, initially partial and later she went in to have it all taken out. - Flu shot today. - Eye exam, pt will like to have it done in our clinic. Will schedule.

## 2014-01-17 NOTE — Progress Notes (Signed)
INTERNAL MEDICINE TEACHING ATTENDING ADDENDUM - Salsabeel Gorelick, MD: I reviewed and discussed at the time of visit with the resident Dr. Emokpae, the patient's medical history, physical examination, diagnosis and results of pertinent tests and treatment and I agree with the patient's care as documented.  

## 2014-01-21 NOTE — Addendum Note (Signed)
Addended by: Bethena Roys on: 01/21/2014 08:53 AM   Modules accepted: Orders

## 2014-01-21 NOTE — Addendum Note (Signed)
Addended by: Bethena Roys on: 01/21/2014 08:55 AM   Modules accepted: Orders

## 2014-01-23 ENCOUNTER — Ambulatory Visit (INDEPENDENT_AMBULATORY_CARE_PROVIDER_SITE_OTHER): Payer: BC Managed Care – PPO | Admitting: Dietician

## 2014-01-23 ENCOUNTER — Encounter: Payer: Self-pay | Admitting: Dietician

## 2014-01-23 DIAGNOSIS — Z0000 Encounter for general adult medical examination without abnormal findings: Secondary | ICD-10-CM

## 2014-01-23 LAB — HM DIABETES EYE EXAM

## 2014-01-23 NOTE — Progress Notes (Signed)
Retinal images done and transmitted today 

## 2014-01-27 ENCOUNTER — Encounter: Payer: Self-pay | Admitting: *Deleted

## 2014-02-05 ENCOUNTER — Encounter: Payer: Self-pay | Admitting: *Deleted

## 2014-03-09 ENCOUNTER — Other Ambulatory Visit: Payer: Self-pay | Admitting: Internal Medicine

## 2014-03-26 ENCOUNTER — Other Ambulatory Visit: Payer: Self-pay | Admitting: Internal Medicine

## 2014-03-28 ENCOUNTER — Other Ambulatory Visit: Payer: Self-pay | Admitting: Internal Medicine

## 2014-04-02 ENCOUNTER — Other Ambulatory Visit: Payer: Self-pay | Admitting: Internal Medicine

## 2014-04-09 ENCOUNTER — Encounter (HOSPITAL_COMMUNITY): Payer: Self-pay | Admitting: Interventional Cardiology

## 2014-05-29 ENCOUNTER — Other Ambulatory Visit: Payer: Self-pay | Admitting: *Deleted

## 2014-05-30 MED ORDER — PROMETHAZINE HCL 12.5 MG PO TABS: 12.5000 mg | ORAL_TABLET | Freq: Four times a day (QID) | ORAL | Status: DC | PRN

## 2014-06-17 ENCOUNTER — Encounter: Payer: Self-pay | Admitting: Internal Medicine

## 2014-06-17 ENCOUNTER — Ambulatory Visit (INDEPENDENT_AMBULATORY_CARE_PROVIDER_SITE_OTHER): Payer: 59 | Admitting: Internal Medicine

## 2014-06-17 VITALS — BP 122/76 | HR 86 | Temp 98.0°F | Ht 60.0 in | Wt 119.1 lb

## 2014-06-17 DIAGNOSIS — R232 Flushing: Secondary | ICD-10-CM

## 2014-06-17 DIAGNOSIS — N951 Menopausal and female climacteric states: Secondary | ICD-10-CM | POA: Insufficient documentation

## 2014-06-17 DIAGNOSIS — E119 Type 2 diabetes mellitus without complications: Secondary | ICD-10-CM

## 2014-06-17 DIAGNOSIS — I10 Essential (primary) hypertension: Secondary | ICD-10-CM

## 2014-06-17 LAB — GLUCOSE, CAPILLARY: Glucose-Capillary: 104 mg/dL — ABNORMAL HIGH (ref 70–99)

## 2014-06-17 LAB — T4, FREE: Free T4: 1.15 ng/dL (ref 0.80–1.80)

## 2014-06-17 LAB — POCT GLYCOSYLATED HEMOGLOBIN (HGB A1C): HEMOGLOBIN A1C: 6.6

## 2014-06-17 LAB — TSH: TSH: 0.939 u[IU]/mL (ref 0.350–4.500)

## 2014-06-17 MED ORDER — METFORMIN HCL 1000 MG PO TABS: 1000.0000 mg | ORAL_TABLET | Freq: Two times a day (BID) | ORAL | Status: DC

## 2014-06-17 NOTE — Patient Instructions (Addendum)
We have ordered some urine tests to further workup the cause for your flushing. I will be in contact with you with the results of these tests. Otherwise, you're doing a good job of controlling your diabetes.  General Instructions:   Thank you for bringing your medicines today. This helps [name] keep you safe from mistakes.   Progress Toward Treatment Goals:  Treatment Goal 06/17/2014  Hemoglobin A1C at goal  Blood pressure at goal    Self Care Goals & Plans:  Self Care Goal 01/16/2014  Manage my medications take my medicines as prescribed; bring my medications to every visit; refill my medications on time  Monitor my health -  Eat healthy foods eat more vegetables; eat foods that are low in salt; eat baked foods instead of fried foods  Be physically active -  Meeting treatment goals -    Home Blood Glucose Monitoring 08/12/2013  Check my blood sugar no home glucose monitoring  When to check my blood sugar N/A     Care Management & Community Referrals:  Referral 08/12/2013  Referrals made for care management support none needed  Referrals made to community resources none

## 2014-06-17 NOTE — Assessment & Plan Note (Addendum)
Patient is reporting a several month history of paroxysmal episodes of flushing which is accompanied by diaphoresis and extreme heat intolerance. Patient states that it happens every 2 weeks or so. She states that she gets flushed all over but notices it most in her face. She denies any associated chest pain, shortness of breath, palpitations, weight loss, changes in appetite, sensations of panic, shakiness. She denies measuring her blood glucose during any of these episodes. Patient is status post total hysterectomy (she reports an initial hysterectomy and unilateral salpingo-oophorectomy followed by another salpingo-oophorectomy several months after the initial procedure) when she was 59 years old, nearly 35 years ago. She states that she experienced similar flushing syndromes after her hysterectomy and oophorectomies for which she was treated for a period of several months with Premarin. Her symptoms resolved at that time within several months and she states that she has not had any recurrent symptoms for several decades since then until now. Patient is coming to clinic asking whether her symptoms are typical of hot flashes which many of her family members state that she may be having. Although persistent hot flashes can persist for more than a decade especially in women in whom the onset of menopause is less than 27 years old, her pattern of symptoms which is remarkable for several days symptomatically decades is concerning for an alternate pathology. Differential diagnosis also includes carcinoid syndrome, hyperthyroidism, pheochromocytoma. Patient denies any associated diarrhea or itching. Patient is normotensive in clinic here today. Patient has normal thyroid studies from 2013. -Repeat TSH and free T4 -24 hour collection of 5 HIAA -24 hour urine collection of fractionated catecholamines  Addendum 06/24/2014: -TSH and free T4 within normal limits -24 hour collection of 5 HIAA within normal limits -24  hour urine collection of fractionated catecholamines within normal limits -Patient notified of results of labs. Will discuss further symptomatic treatment at next visit.

## 2014-06-17 NOTE — Assessment & Plan Note (Signed)
BP Readings from Last 3 Encounters:  06/17/14 122/76  01/16/14 130/71  11/26/13 138/82    Lab Results  Component Value Date   NA 143 10/28/2013   K 3.7 10/28/2013   CREATININE 0.53 10/28/2013    Assessment: Blood pressure control: controlled Progress toward BP goal:  at goal Comments: Patient states that she is compliant with her lisinopril-hydrochlorothiazide 20-12.5 mg daily.  Plan: Medications:  continue current medications Educational resources provided:   Self management tools provided:   Other plans: None

## 2014-06-17 NOTE — Assessment & Plan Note (Signed)
Lab Results  Component Value Date   HGBA1C 6.6 06/17/2014   HGBA1C 7.4 11/26/2013   HGBA1C 7.4 07/31/2013     Assessment: Diabetes control: good control (HgbA1C at goal) Progress toward A1C goal:  at goal Comments: Patient is compliant with her metformin 1000 mg twice a day.  Plan: Medications:  continue current medications Home glucose monitoring: Frequency:   Timing:   Instruction/counseling given: reminded to get eye exam Educational resources provided:   Self management tools provided:   Other plans: None

## 2014-06-17 NOTE — Progress Notes (Signed)
   Subjective:    Patient ID: [name], female    DOB: [birth year], 59 y.o.   MRN: [id number]  HPI  Patient is a 59 year old with a history of type 2 diabetes, hypertension, hyperlipidemia, GERD, sickle cell trait presents to clinic for evaluation of flushing syndrome.   Please refer to separate problem-list charting for more details.  Review of Systems  Constitutional: Negative for fever and chills.  HENT: Negative for rhinorrhea and sore throat.   Eyes: Negative for visual disturbance.  Respiratory: Negative for cough and shortness of breath.   Cardiovascular: Negative for chest pain and palpitations.  Gastrointestinal: Negative for nausea, vomiting, abdominal pain, diarrhea, constipation and blood in stool.  Endocrine: Positive for heat intolerance.  Genitourinary: Negative for dysuria and hematuria.  Neurological: Negative for syncope.       Objective:   Physical Exam  Constitutional: She is oriented to person, place, and time. She appears well-developed and well-nourished. No distress.  HENT:  Head: Normocephalic and atraumatic.  Eyes: EOM are normal. Pupils are equal, round, and reactive to light. Left eye exhibits no discharge.  Neck: Normal range of motion. Neck supple. No thyromegaly present.  Cardiovascular: Normal rate and regular rhythm.  Exam reveals no gallop and no friction rub.   No murmur heard. Pulmonary/Chest: Effort normal and breath sounds normal. No respiratory distress. She has no wheezes. She has no rales.  Abdominal: Soft. Bowel sounds are normal. She exhibits no distension. There is no tenderness. There is no rebound.  Musculoskeletal: She exhibits no edema.  Neurological: She is alert and oriented to person, place, and time. No cranial nerve deficit.  Skin: Skin is warm and dry. No rash noted.  Psychiatric: She has a normal mood and affect. Thought content normal.          Assessment & Plan:  Please refer to separate problem-list  charting for more details.

## 2014-06-18 NOTE — Progress Notes (Signed)
INTERNAL MEDICINE TEACHING ATTENDING ADDENDUM - Aldine Contes, MD: I reviewed and discussed at the time of visit with the resident Dr, Raelene Bott, the patient's medical history, physical examination, diagnosis and results of pertinent tests and treatment and I agree with the patient's care as documented.

## 2014-06-19 ENCOUNTER — Other Ambulatory Visit: Payer: 59

## 2014-06-19 DIAGNOSIS — R232 Flushing: Secondary | ICD-10-CM

## 2014-06-22 LAB — METANEPHRINES, URINE, 24 HOUR
METANEPHRINES UR: 81 ug/(24.h) — AB (ref 90–315)
Metaneph Total, Ur: 338 mcg/24 h (ref 224–832)
Normetanephrine, 24H Ur: 257 mcg/24 h (ref 122–676)

## 2014-06-22 LAB — 5 HIAA, QUANTITATIVE, URINE, 24 HOUR: 5-HIAA, 24 Hr Urine: 5.3 mg/24 h (ref <=6.0)

## 2014-06-23 LAB — CATECHOLAMINES, FRACTIONATED, URINE, 24 HOUR
Calculated Total (E+NE): 32 mcg/24 h (ref 26–121)
Creatinine, Urine mg/day-CATEUR: 0.8 g/(24.h) (ref 0.63–2.50)
Dopamine, 24 hr Urine: 101 mcg/24 h (ref 52–480)
Norepinephrine, 24 hr Ur: 32 mcg/24 h (ref 15–100)
Total Volume - CF 24Hr U: 2100 mL

## 2014-07-08 ENCOUNTER — Encounter: Payer: Self-pay | Admitting: Internal Medicine

## 2014-07-08 ENCOUNTER — Ambulatory Visit (INDEPENDENT_AMBULATORY_CARE_PROVIDER_SITE_OTHER): Payer: 59 | Admitting: Internal Medicine

## 2014-07-08 VITALS — BP 138/70 | HR 88 | Temp 98.2°F | Ht 60.0 in | Wt 119.7 lb

## 2014-07-08 DIAGNOSIS — E119 Type 2 diabetes mellitus without complications: Secondary | ICD-10-CM

## 2014-07-08 DIAGNOSIS — N951 Menopausal and female climacteric states: Secondary | ICD-10-CM

## 2014-07-08 DIAGNOSIS — Z0000 Encounter for general adult medical examination without abnormal findings: Secondary | ICD-10-CM

## 2014-07-08 NOTE — Assessment & Plan Note (Addendum)
Due for several health screening, she doesn't want to have any one scheduled now, she cam in today for just her results, she says she has to follow up with her Doctor at [place], in April, and after that she will come in for follow up and get it all done. Due for eye, colonosc, mammogram and foot exam.   -  Appointment in May- June.

## 2014-07-08 NOTE — Progress Notes (Signed)
Patient ID: [name], female   DOB: Aug 14, 1955, 59 y.o.   MRN: [id number]    Subjective:   Patient ID: [name] female   DOB: 1956-04-08 59 y.o.   MRN: [id number]  HPI: [name] is a 59 y.o. with PMH listed below. Presented today to discuss the results of her test done last visit when she complained of hot flashes.  Past Medical History  Diagnosis Date  . Hypertension   . HLD (hyperlipidemia) 2009  . Sickle cell trait   . Pulmonary embolus 08/2004    Two areas of V/Q mismatch. Findings compatible  with high probability for pulmonary embolus.; pt was on coumadin for 1 year  . Pancreatic divisum     S/P ERCP with stenting 07/2010 at [facility], then stent removal (08/05/2010)  . CVA (cerebral infarction) 1993    Per report by patient she had a stroke and prolonged rehab course eventually resolving her right sided weakness, as per her hx obtained 08/2007  . Blood dyscrasia     thalasemia, sickle cell trait  . Chronic abdominal pain     Unclear etiology, thought to be due to chronic pancreatitis previously in setting of her pancreatic divisum - however, EUS and EGD performed at [place]  (10/05/2011) showing normal esophageall, gastric, duodenal mucosa. EUS showing no pancreatic masses, cysts, or changes of chronic pancreatitis. Biliary system nondilated and had no endosonographic abnormalities.  . Type II diabetes mellitus 2010    well controlled  . Asthma     childhood asthma  . Uterine cancer   . Pneumonia ~ 1969  . Beta thalassemia   . Thalassemia   . Migraine headache     "q other year now" (11/20/2012)  . Seizures     "after the stroke in 1993; none for years now" 11/20/2012)  . Stroke 1993    very small amount of left sided weakness   Current Outpatient Prescriptions  Medication Sig Dispense Refill  . CREON 12000 UNITS CPEP capsule TAKE 1 CAPSULE BY MOUTH 3 TIMES DAILY BEFORE MEALS. 90 capsule 1  . CVS SENNA PLUS 8.6-50 MG per tablet TAKE 2  TABLETS BY MOUTH AT BEDTIME. 30 tablet 3  . ibuprofen (ADVIL,MOTRIN) 800 MG tablet Take 1 tablet (800 mg total) by mouth every 8 (eight) hours as needed. 25 tablet 0  . lisinopril-hydrochlorothiazide (PRINZIDE,ZESTORETIC) 20-12.5 MG per tablet TAKE 2 TABLETS BY MOUTH EVERY DAY 60 tablet 3  . metFORMIN (GLUCOPHAGE) 1000 MG tablet Take 1 tablet (1,000 mg total) by mouth 2 (two) times daily with a meal. 60 tablet 6  . pantoprazole (PROTONIX) 20 MG tablet TAKE 1 TABLET BY MOUTH EVERY DAY 30 tablet 1  . pravastatin (PRAVACHOL) 40 MG tablet TAKE 1 TABLET BY MOUTH EVERY DAY 30 tablet 3  . promethazine (PHENERGAN) 12.5 MG tablet Take 1 tablet (12.5 mg total) by mouth every 6 (six) hours as needed. for nausea 60 tablet 5   No current facility-administered medications for this visit.   Family History  Problem Relation Age of Onset  . Prostate cancer Father   . Cancer Father     stomach ca died x 1 year ago  . Sickle cell anemia Brother   . Lung cancer Maternal Aunt   . Lung cancer Maternal Uncle   . Breast cancer Maternal Grandmother   . Heart disease Maternal Grandfather   . Heart disease Paternal Grandfather   . Diabetes Other   . Cancer Other     multiple cancers  pancreas,colon, breast   History   Social History  . Marital Status: Married    Spouse Name: N/A  . Number of Children: N/A  . Years of Education: 12   Occupational History  .  Unemployed   Social History Main Topics  . Smoking status: Former Smoker -- 0.50 packs/day for 30 years    Types: Cigarettes    Quit date: 01/01/1999  . Smokeless tobacco: Never Used  . Alcohol Use: No  . Drug Use: No  . Sexual Activity: Yes   Other Topics Concern  . None   Social History Narrative   Works at [employer] as a Librarian, academic, on Retail banker about 9 hours/ day   Review of Systems: CONSTITUTIONAL- No Fever, or change in appetite. SKIN- No Rash, colour changes or itching. HEAD- No Headache or dizziness. RESPIRATORY- No Cough or  SOB. CARDIAC- No Palpitations, DOE, PND or chest pain. Abd- Has mild abdominal pain for which she is on chronic opioids URINARY- No Frequency PYSCH- Denies depression or anxiety.  Objective:  Physical Exam: Filed Vitals:   07/08/14 1500  BP: 138/70  Pulse: 88  Temp: 98.2 F (36.8 C)  TempSrc: Oral  Height: 5' (1.524 m)  Weight: 119 lb 11.2 oz (54.296 kg)  SpO2: 100%   GENERAL- alert, co-operative, appears as stated age, not in any distress, slim appearing lady HEENT- Atraumatic, normocephalic, PERRL, poor dentition. CARDIAC- RRR, no murmurs, rubs or gallops. RESP- Moving equal volumes of air,  no wheezes or crackles. ABDOMEN- Soft, mild tenderness, unchanged from prior NEURO- No obvious Cr N abnormality, Gait- Normal. EXTREMITIES-No pedal edema, Dp pulses palpable. SKIN- Warm, dry, No rash or lesion. PSYCH- Normal mood and affect, appropriate thought content and speech.  Assessment & Plan:   The patient's case and plan of care was discussed with attending physician, Dr. Lynnae January.  Please see problem based charting for assessment and plan.

## 2014-07-08 NOTE — Assessment & Plan Note (Signed)
HgBA1c has improved. HGBA1c- 6.6. She has been taking the same metformin 1000mg  BID. She says she was told by a friend to start taking ground cinnamon, which is what she has been doing in addition to her Metformin.

## 2014-07-08 NOTE — Assessment & Plan Note (Signed)
Pt came today to discuss results of tests. All of which were normal. She also says immediately after those test were ordered, when she returned the urine sample, she did not have the symptoms again.   Plans- gave pt information on menopause and hot flashes.

## 2014-07-08 NOTE — Patient Instructions (Signed)
You are due for your Colonoscopy, Mammogram, eye exam and foot exam. We will like to get the mammogram done sometime soon.   We will do the rest just has we have agreed at your next visit.   It was nice seeing you today.  Menopause Menopause is the normal time of life when menstrual periods stop completely. Menopause is complete when you have missed 12 consecutive menstrual periods. It usually occurs between the ages of 55 years and 40 years. Very rarely does a woman develop menopause before the age of 65 years. At menopause, your ovaries stop producing the female hormones estrogen and progesterone. This can cause undesirable symptoms and also affect your health. Sometimes the symptoms may occur 4-5 years before the menopause begins. There is no relationship between menopause and:  Oral contraceptives.  Number of children you had.  Race.  The age your menstrual periods started (menarche). Heavy smokers and very thin women may develop menopause earlier in life. CAUSES  The ovaries stop producing the female hormones estrogen and progesterone.  Other causes include:  Surgery to remove both ovaries.  The ovaries stop functioning for no known reason.  Tumors of the pituitary gland in the brain.  Medical disease that affects the ovaries and hormone production.  Radiation treatment to the abdomen or pelvis.  Chemotherapy that affects the ovaries. SYMPTOMS   Hot flashes.  Night sweats.  Decrease in sex drive.  Vaginal dryness and thinning of the vagina causing painful intercourse.  Dryness of the skin and developing wrinkles.  Headaches.  Tiredness.  Irritability.  Memory problems.  Weight gain.  Bladder infections.  Hair growth of the face and chest.  Infertility. More serious symptoms include:  Loss of bone (osteoporosis) causing breaks (fractures).  Depression.  Hardening and narrowing of the arteries (atherosclerosis) causing heart attacks and  strokes. DIAGNOSIS   When the menstrual periods have stopped for 12 straight months.  Physical exam.  Hormone studies of the blood. TREATMENT  There are many treatment choices and nearly as many questions about them. The decisions to treat or not to treat menopausal changes is an individual choice made with your health care provider. Your health care provider can discuss the treatments with you. Together, you can decide which treatment will work best for you. Your treatment choices may include:   Hormone therapy (estrogen and progesterone).  Non-hormonal medicines.  Treating the individual symptoms with medicine (for example antidepressants for depression).  Herbal medicines that may help specific symptoms.  Counseling by a psychiatrist or psychologist.  Group therapy.  Lifestyle changes including:  Eating healthy.  Regular exercise.  Limiting caffeine and alcohol.  Stress management and meditation.  No treatment. HOME CARE INSTRUCTIONS   Take the medicine your health care provider gives you as directed.  Get plenty of sleep and rest.  Exercise regularly.  Eat a diet that contains calcium (good for the bones) and soy products (acts like estrogen hormone).  Avoid alcoholic beverages.  Do not smoke.  If you have hot flashes, dress in layers.  Take supplements, calcium, and vitamin D to strengthen bones.  You can use over-the-counter lubricants or moisturizers for vaginal dryness.  Group therapy is sometimes very helpful.  Acupuncture may be helpful in some cases. SEEK MEDICAL CARE IF:   You are not sure you are in menopause.  You are having menopausal symptoms and need advice and treatment.  You are still having menstrual periods after age 25 years.  You have pain with intercourse.  Menopause is complete (no menstrual period for 12 months) and you develop vaginal bleeding.  You need a referral to a specialist (gynecologist, psychiatrist, or  psychologist) for treatment. SEEK IMMEDIATE MEDICAL CARE IF:   You have severe depression.  You have excessive vaginal bleeding.  You fell and think you have a broken bone.  You have pain when you urinate.  You develop leg or chest pain.  You have a fast pounding heart beat (palpitations).  You have severe headaches.  You develop vision problems.  You feel a lump in your breast.  You have abdominal pain or severe indigestion.

## 2014-07-09 NOTE — Progress Notes (Signed)
Internal Medicine Clinic Attending  Case discussed with Dr. Denton Brick soon after the resident saw the patient.  We reviewed the resident's history and exam and pertinent patient test results.  I agree with the assessment, diagnosis, and plan of care documented in the resident's note. I am familiar with pt from prior hospitalizations and recent [facility] appts. Labs results had been reviewed and nl.

## 2014-07-20 ENCOUNTER — Other Ambulatory Visit: Payer: Self-pay | Admitting: Internal Medicine

## 2014-07-29 ENCOUNTER — Other Ambulatory Visit: Payer: Self-pay | Admitting: Internal Medicine

## 2014-09-03 ENCOUNTER — Other Ambulatory Visit: Payer: Self-pay | Admitting: Internal Medicine

## 2014-10-27 ENCOUNTER — Other Ambulatory Visit: Payer: Self-pay | Admitting: *Deleted

## 2014-10-27 MED ORDER — PROMETHAZINE HCL 12.5 MG PO TABS: ORAL_TABLET | ORAL | Status: DC

## 2014-11-04 ENCOUNTER — Other Ambulatory Visit: Payer: Self-pay | Admitting: Internal Medicine

## 2014-12-01 ENCOUNTER — Ambulatory Visit (INDEPENDENT_AMBULATORY_CARE_PROVIDER_SITE_OTHER): Payer: 59 | Admitting: Internal Medicine

## 2014-12-01 ENCOUNTER — Encounter: Payer: Self-pay | Admitting: Internal Medicine

## 2014-12-01 ENCOUNTER — Ambulatory Visit (HOSPITAL_COMMUNITY)
Admission: RE | Admit: 2014-12-01 | Discharge: 2014-12-01 | Disposition: A | Discharge status: Home / Self Care | Payer: Medicare Other | Source: Ambulatory Visit | Attending: Internal Medicine | Admitting: Internal Medicine

## 2014-12-01 VITALS — BP 145/76 | HR 82 | Temp 98.6°F | Wt 120.6 lb

## 2014-12-01 DIAGNOSIS — M47892 Other spondylosis, cervical region: Secondary | ICD-10-CM | POA: Insufficient documentation

## 2014-12-01 DIAGNOSIS — M25522 Pain in left elbow: Secondary | ICD-10-CM

## 2014-12-01 DIAGNOSIS — M79622 Pain in left upper arm: Secondary | ICD-10-CM | POA: Insufficient documentation

## 2014-12-01 DIAGNOSIS — M4802 Spinal stenosis, cervical region: Secondary | ICD-10-CM | POA: Diagnosis not present

## 2014-12-01 DIAGNOSIS — M79602 Pain in left arm: Secondary | ICD-10-CM | POA: Diagnosis present

## 2014-12-01 MED ORDER — PROMETHAZINE HCL 12.5 MG PO TABS: 12.5000 mg | ORAL_TABLET | Freq: Four times a day (QID) | ORAL | Status: DC | PRN

## 2014-12-01 MED ORDER — CAPSAICIN 0.075 % EX CREA
1.0000 | TOPICAL_CREAM | Freq: Four times a day (QID) | CUTANEOUS | Status: DC
Start: 2014-12-01 — End: 2017-02-24

## 2014-12-01 NOTE — Patient Instructions (Signed)
Please continue to use your left arm as tolerated to avoid losing strength and flexibility. We will call you with results of the neck x-ray and if any additional treatments are needed.  Please apply the capsacin topical cream to the area as needed up to 4 times daily for pain. You can use heat or ice packs for relief for up to 15 minutes at a time.  Physical therapy will call you with an appointment time for a session with education on strengthening and stretches for your arm to return to normal activity level.

## 2014-12-01 NOTE — Assessment & Plan Note (Addendum)
Patient with new onset of intermittent left arm pain in distribution of the left shoulder and triceps that worsens with use with accompanying left hand numbness/tingling. Not accompanied by signficant weakness. This may be from overuse injury versus radiculopathy given the presentation and history. She does not have any prior history of injury to the location. Her pain is partially controlled on her chronic pain medication for pancreatitis.  - Plain film xray cervical spine - Topical pain control with capcasin 0.075%  - Referral to outpatient physical therapy for ~1 session for strengthening and improving ROM of arm that has been limited by pain.

## 2014-12-01 NOTE — Progress Notes (Signed)
Patient ID: Gracelynn Bircher, female   DOB: [birth year], 59 y.o.   MRN: [id number]   Subjective:   Patient ID: Nonnie Kroft female   DOB: March 02, 1956 59 y.o.   MRN: [id number]  HPI: Ms.Dameka Kimberlin is a 59 y.o. woman presenting due to 6 week history of left arm pain. She first noticed this pain when moving laundry above her head. It is achy in character and typically worst after use on activties of daily living. This is also associated with numbness in 4 fingers of her left hand intermittently during these 6 weeks. She does report mild neck pain when waking up some mornings since 6 weeks ago. She does not have any weakness associated with this pain. She has not noticed any heat, tenderness, or swelling of her shoulder or arm. She does not have any history of major injury to her arm, and does not have any chronic pain in the shoulder or arm before this recent onset.   Past Medical History  Diagnosis Date  . Hypertension   . HLD (hyperlipidemia) 2009  . Sickle cell trait   . Pulmonary embolus 08/2004    Two areas of V/Q mismatch. Findings compatible  with high probability for pulmonary embolus.; pt was on coumadin for 1 year  . Pancreatic divisum     S/P ERCP with stenting 07/2010 at [facility], then stent removal (08/05/2010)  . CVA (cerebral infarction) 1993    Per report by patient she had a stroke and prolonged rehab course eventually resolving her right sided weakness, as per her hx obtained 08/2007  . Blood dyscrasia     thalasemia, sickle cell trait  . Chronic abdominal pain     Unclear etiology, thought to be due to chronic pancreatitis previously in setting of her pancreatic divisum - however, EUS and EGD performed at [place]  (10/05/2011) showing normal esophageall, gastric, duodenal mucosa. EUS showing no pancreatic masses, cysts, or changes of chronic pancreatitis. Biliary system nondilated and had no endosonographic abnormalities.  . Type II diabetes mellitus 2010      well controlled  . Asthma     childhood asthma  . Uterine cancer   . Pneumonia ~ 1969  . Beta thalassemia   . Thalassemia   . Migraine headache     "q other year now" (11/20/2012)  . Seizures     "after the stroke in 1993; none for years now" 11/20/2012)  . Stroke 1993    very small amount of left sided weakness   Current Outpatient Prescriptions  Medication Sig Dispense Refill  . capsicum (ZOSTRIX) 0.075 % topical cream Apply 1 application topically 4 (four) times daily.    Marland Kitchen CREON 12000 UNITS CPEP capsule TAKE 1 CAPSULE BY MOUTH 3 TIMES DAILY BEFORE MEALS. 90 capsule 1  . CVS SENNA PLUS 8.6-50 MG per tablet TAKE 2 TABLETS BY MOUTH AT BEDTIME. 30 tablet 6  . omeprazole (PRILOSEC) 40 MG capsule Take 40 mg by mouth.    . promethazine (PHENERGAN) 12.5 MG tablet Take 1 tablet (12.5 mg total) by mouth every 6 (six) hours as needed for nausea. 60 tablet 2  . ibuprofen (ADVIL,MOTRIN) 800 MG tablet Take 1 tablet (800 mg total) by mouth every 8 (eight) hours as needed. 25 tablet 0  . lisinopril-hydrochlorothiazide (PRINZIDE,ZESTORETIC) 20-12.5 MG per tablet TAKE 2 TABLETS BY MOUTH EVERY DAY 60 tablet 3  . metFORMIN (GLUCOPHAGE) 1000 MG tablet Take 1 tablet (1,000 mg total) by mouth 2 (two) times daily with a meal. 60  tablet 6  . OxyCODONE (OXYCONTIN) 10 mg T12A 12 hr tablet Take 10 mg by mouth.    . pantoprazole (PROTONIX) 20 MG tablet TAKE 1 TABLET BY MOUTH EVERY DAY 30 tablet 1  . pravastatin (PRAVACHOL) 40 MG tablet TAKE 1 TABLET BY MOUTH EVERY DAY 30 tablet 3   No current facility-administered medications for this visit.   Family History  Problem Relation Age of Onset  . Prostate cancer Father   . Cancer Father     stomach ca died x 1 year ago  . Sickle cell anemia Brother   . Lung cancer Maternal Aunt   . Lung cancer Maternal Uncle   . Breast cancer Maternal Grandmother   . Heart disease Maternal Grandfather   . Heart disease Paternal Grandfather   . Diabetes Other   . Cancer  Other     multiple cancers pancreas,colon, breast   History   Social History  . Marital Status: Married    Spouse Name: N/A  . Number of Children: N/A  . Years of Education: 12   Occupational History  .  Unemployed   Social History Main Topics  . Smoking status: Former Smoker -- 0.50 packs/day for 30 years    Types: Cigarettes    Quit date: 01/01/1999  . Smokeless tobacco: Never Used  . Alcohol Use: No  . Drug Use: No  . Sexual Activity: Yes   Other Topics Concern  . None   Social History Narrative   Works at [employer] as a Librarian, academic, on Retail banker about 9 hours/ day   Review of Systems: Review of Systems  Cardiovascular: Negative for chest pain.  Gastrointestinal: Negative for heartburn and abdominal pain.  Musculoskeletal: Positive for myalgias, joint pain and neck pain.  Neurological: Positive for tingling. Negative for focal weakness, weakness and headaches.   Objective:  Physical Exam: Filed Vitals:   12/01/14 0851  BP: 145/76  Pulse: 82  Temp: 98.6 F (37 C)  TempSrc: Oral  Weight: 120 lb 9.6 oz (54.704 kg)  SpO2: 100%   GENERAL- alert, well-kept, NAD CARDIAC- RRR, no murmurs, rubs or gallops BACK- Normal curvature of the spine, No tenderness along the vertebrae, no CVA tenderness NEURO- No obvious Cr N abnormality, left upper extremity 4/5 strength on shoulder abduction and elbow flexion possibly limited 2/2 pain, sensation intact throughout left hand EXTREMITIES- No tenderness to palpation of left shoulder or left arm, pain worsened on active and not on passive ROM abduction above head. Pain not reproduced on resisted supination. SKIN- No bruising, rash, or other skin changes  Assessment & Plan:

## 2014-12-09 NOTE — Progress Notes (Signed)
Internal Medicine Clinic Attending  I saw and evaluated the patient.  I personally confirmed the key portions of the history and exam documented by Dr. Rice and I reviewed pertinent patient test results.  The assessment, diagnosis, and plan were formulated together and I agree with the documentation in the resident's note.  

## 2014-12-23 ENCOUNTER — Ambulatory Visit: Payer: 59 | Admitting: Physical Therapy

## 2015-01-14 ENCOUNTER — Other Ambulatory Visit: Payer: Self-pay | Admitting: *Deleted

## 2015-01-14 MED ORDER — PROMETHAZINE HCL 12.5 MG PO TABS: 12.5000 mg | ORAL_TABLET | Freq: Four times a day (QID) | ORAL | Status: DC | PRN

## 2015-01-23 NOTE — Addendum Note (Signed)
Addended by: Hulan Fray on: 01/23/2015 07:59 PM   Modules accepted: Orders

## 2015-02-25 ENCOUNTER — Encounter: Payer: Self-pay | Admitting: *Deleted

## 2015-03-04 ENCOUNTER — Other Ambulatory Visit: Payer: Self-pay | Admitting: *Deleted

## 2015-03-05 MED ORDER — METFORMIN HCL 1000 MG PO TABS: 1000.0000 mg | ORAL_TABLET | Freq: Two times a day (BID) | ORAL | Status: DC

## 2015-03-10 ENCOUNTER — Other Ambulatory Visit: Payer: Self-pay | Admitting: Internal Medicine

## 2015-03-24 ENCOUNTER — Encounter: Payer: Self-pay | Admitting: Student

## 2015-03-24 ENCOUNTER — Other Ambulatory Visit: Payer: Self-pay | Admitting: Student in an Organized Health Care Education/Training Program

## 2015-04-15 ENCOUNTER — Other Ambulatory Visit: Payer: Self-pay | Admitting: *Deleted

## 2015-04-19 MED ORDER — PROMETHAZINE HCL 12.5 MG PO TABS: 12.5000 mg | ORAL_TABLET | Freq: Four times a day (QID) | ORAL | Status: DC | PRN

## 2015-06-05 ENCOUNTER — Other Ambulatory Visit: Payer: Self-pay | Admitting: *Deleted

## 2015-06-09 MED ORDER — PROMETHAZINE HCL 12.5 MG PO TABS: 12.5000 mg | ORAL_TABLET | Freq: Four times a day (QID) | ORAL | Status: DC | PRN

## 2015-07-01 ENCOUNTER — Other Ambulatory Visit: Payer: Self-pay

## 2015-07-02 ENCOUNTER — Other Ambulatory Visit: Payer: Self-pay | Admitting: Internal Medicine

## 2015-07-02 ENCOUNTER — Encounter: Payer: Self-pay | Admitting: Internal Medicine

## 2015-07-02 ENCOUNTER — Ambulatory Visit (INDEPENDENT_AMBULATORY_CARE_PROVIDER_SITE_OTHER): Payer: Medicare HMO | Admitting: Internal Medicine

## 2015-07-02 VITALS — BP 145/75 | HR 72 | Temp 98.0°F | Ht 60.0 in | Wt 115.7 lb

## 2015-07-02 DIAGNOSIS — E119 Type 2 diabetes mellitus without complications: Secondary | ICD-10-CM

## 2015-07-02 DIAGNOSIS — Z7984 Long term (current) use of oral hypoglycemic drugs: Secondary | ICD-10-CM | POA: Diagnosis not present

## 2015-07-02 DIAGNOSIS — Z0000 Encounter for general adult medical examination without abnormal findings: Secondary | ICD-10-CM

## 2015-07-02 DIAGNOSIS — I10 Essential (primary) hypertension: Secondary | ICD-10-CM

## 2015-07-02 DIAGNOSIS — Z90710 Acquired absence of both cervix and uterus: Secondary | ICD-10-CM | POA: Diagnosis not present

## 2015-07-02 DIAGNOSIS — E1165 Type 2 diabetes mellitus with hyperglycemia: Secondary | ICD-10-CM

## 2015-07-02 DIAGNOSIS — Z1231 Encounter for screening mammogram for malignant neoplasm of breast: Secondary | ICD-10-CM

## 2015-07-02 LAB — POCT GLYCOSYLATED HEMOGLOBIN (HGB A1C): HEMOGLOBIN A1C: 8.8

## 2015-07-02 LAB — GLUCOSE, CAPILLARY: GLUCOSE-CAPILLARY: 277 mg/dL — AB (ref 65–99)

## 2015-07-02 MED ORDER — LISINOPRIL-HYDROCHLOROTHIAZIDE 20-12.5 MG PO TABS: 2.0000 | ORAL_TABLET | Freq: Every day | ORAL | Status: DC

## 2015-07-02 NOTE — Progress Notes (Signed)
Medicine attending: Medical history, presenting problems, physical findings, and medications, reviewed with resident physician Dr Ejiro Emokpae on the day of the patient visit and I concur with her evaluation and management plan. 

## 2015-07-02 NOTE — Patient Instructions (Signed)
It is very important that you watch your diet and exercise. Your Hgab1c is showing that your blood sugars have been on the high side.   If we cannot get your Hgab1c down when we check it in 3 months, we will have to add another medication.  Also please follow up with Ob/gyn to get checked out since you have not had a PAP smear in 6 years.  Also for your foot- Rest, elevate, apply ice, you can also you ibuprofen- take one to two tablets up to 3 times a day as needed.   We will see you in 1 month for your eye exam.

## 2015-07-02 NOTE — Assessment & Plan Note (Signed)
Lab Results  Component Value Date   HGBA1C 8.8 07/02/2015   HGBA1C 6.6 06/17/2014   HGBA1C 7.4 11/26/2013     Assessment: Diabetes control:  Uncontrolled Comments: Complaint with metformin 1000mg  BID, but says she has not been following her diet as strictly as she used to, with christmas and thanksgiving. She wasn't to go back to exercise and diet and if still uncontrolled, she says she will be open to trying another med as far as she does not have the side effects she felt when she was tried on other meds.  Per chart she did not tolerate Sulphonylureas. Weight is stable.  Plan- Will give a trial of diet and exercise, and cont metformin as she has doen well on this before. - Consider thiazolidinediones or DPP4 if still persistently elevated. - Pt wants to come in 1 month for eye exam. -

## 2015-07-02 NOTE — Progress Notes (Signed)
Patient ID: [name], female   DOB: 1955/08/14, 60 y.o.   MRN: [id number]   Subjective:   Patient ID: [name] female   DOB: Oct 03, 1955 60 y.o.   MRN: [id number]  HPI: [name] is a 60 y.o. with PMH listed below. Presented today with complaints of foot pain started 2 days ago after a figurine on her dresser dropped on her feet. Slight swelling and pain since episode, but getting better. She is also here to follow up on her DM, and HTN.  Past Medical History  Diagnosis Date  . Hypertension   . HLD (hyperlipidemia) 2009  . Sickle cell trait ([place])   . Pulmonary embolus ([place]) 08/2004    Two areas of V/Q mismatch. Findings compatible  with high probability for pulmonary embolus.; pt was on coumadin for 1 year  . Pancreatic divisum     S/P ERCP with stenting 07/2010 at [facility], then stent removal (08/05/2010)  . CVA (cerebral infarction) 1993    Per report by patient she had a stroke and prolonged rehab course eventually resolving her right sided weakness, as per her hx obtained 08/2007  . Blood dyscrasia     thalasemia, sickle cell trait  . Chronic abdominal pain     Unclear etiology, thought to be due to chronic pancreatitis previously in setting of her pancreatic divisum - however, EUS and EGD performed at [place]  (10/05/2011) showing normal esophageall, gastric, duodenal mucosa. EUS showing no pancreatic masses, cysts, or changes of chronic pancreatitis. Biliary system nondilated and had no endosonographic abnormalities.  . Type II diabetes mellitus ([place]) 2010    well controlled  . Asthma     childhood asthma  . Uterine cancer ([place])   . Pneumonia ~ 1969  . Beta thalassemia ([place])   . Thalassemia   . Migraine headache     "q other year now" (11/20/2012)  . Seizures ([place])     "after the stroke in 1993; none for years now" 11/20/2012)  . Stroke [facility]) 1993    very small amount of left sided weakness   Review of Systems: CONSTITUTIONAL- No Fever,  weightloss, . SKIN- No Rash, colour changes or itching. HEAD- No Headache or dizziness. Mouth/throat- No Sorethroat, dentures, or bleeding gums. RESPIRATORY- No Cough or SOB. CARDIAC- No Palpitations, or chest pain. GI- No vomiting, diarrhoea, constipation, abd pain. URINARY- No Frequency, or dysuria. NEUROLOGIC- No Numbness, or burning.  Objective:  Physical Exam: Filed Vitals:   07/02/15 0905  BP: 145/75  Pulse: 72  Temp: 98 F (36.7 C)  TempSrc: Oral  Height: 5' (1.524 m)  Weight: 115 lb 11.2 oz (52.481 kg)  SpO2: 100%   GENERAL- alert, co-operative, appears as stated age, not in any distress. HEENT- Atraumatic, normocephalic, PERRL, oral mucosa appears moist, poor dentition, neck supple. CARDIAC- RRR, no murmurs, rubs or gallops. RESP- Moving equal volumes of air, and clear to auscultation bilaterally, no wheezes or crackles. ABDOMEN- Soft, nontender, no guarding or rebound, bowel sounds present. BACK- Normal curvature of the spine, no CVA tenderness. NEURO- No obvious Cr N abnormality, strenght upper and lower extremities- 5/5,Gait- Normal. EXTREMITIES- pulse 2+, symmetric, no pedal edema, slight swelling lateral side of right feet about mid feet, tender, normal range of motion at ankle, no redness.  SKIN- Warm, dry, No rash or lesion. PSYCH- Normal mood and affect, appropriate thought content and speech.  Assessment & Plan:   The patient's case and plan of care was discussed with attending physician, Dr. Beryle Beams.  Foot injury-from object falling on her leg- rest, ice, elevate foot, also NSAIDs OTC.  Please see problem based charting for assessment and plan.

## 2015-07-02 NOTE — Assessment & Plan Note (Signed)
BP Readings from Last 3 Encounters:  07/02/15 145/75  12/01/14 145/76  07/08/14 138/70    Lab Results  Component Value Date   NA 143 10/28/2013   K 3.7 10/28/2013   CREATININE 0.53 10/28/2013    Assessment: Blood pressure control:  Controlled Comments: Complaint with Lisinopril-HCTZ 40-25mg  daily, she denies chest pain or dizziness. No leg swelling. Plan- Cont meds - Bmet today  Addendum- Pt left, so labs not done, please re-order next visit.

## 2015-07-02 NOTE — Assessment & Plan Note (Addendum)
She had a hysterectomy, when she was 23 yrs for a malignancy - ?name. She was supposed to follow up with Gyn intermittently. She was told she would not need pap smears. Last PAP- 2011.  Plan- Pt will like to follow up with Gyn. - Colonoscopy net visit. - Will place order for mammogram. - HIV and Hep c, pt worked out before they could be drawn, she will be back in 1 month.

## 2015-07-17 ENCOUNTER — Ambulatory Visit
Admission: RE | Admit: 2015-07-17 | Discharge: 2015-07-17 | Disposition: A | Discharge status: Home / Self Care | Payer: Medicare HMO | Source: Ambulatory Visit | Attending: Oncology | Admitting: Oncology

## 2015-07-17 DIAGNOSIS — Z1231 Encounter for screening mammogram for malignant neoplasm of breast: Secondary | ICD-10-CM

## 2015-07-22 ENCOUNTER — Encounter: Payer: Medicare HMO | Admitting: Dietician

## 2015-07-23 ENCOUNTER — Ambulatory Visit: Payer: Medicare HMO | Admitting: Dietician

## 2015-07-23 ENCOUNTER — Other Ambulatory Visit: Payer: Self-pay | Admitting: Dietician

## 2015-07-23 DIAGNOSIS — E119 Type 2 diabetes mellitus without complications: Secondary | ICD-10-CM

## 2015-07-23 NOTE — Progress Notes (Signed)
Retinal images taken and transmitted today.

## 2015-07-31 ENCOUNTER — Other Ambulatory Visit: Payer: Self-pay | Admitting: Internal Medicine

## 2015-07-31 NOTE — Telephone Encounter (Signed)
Last seen 07/02/15 F/U appt 5/2.

## 2015-08-18 ENCOUNTER — Encounter: Payer: Self-pay | Admitting: Dietician

## 2015-08-18 ENCOUNTER — Other Ambulatory Visit: Payer: Self-pay | Admitting: Internal Medicine

## 2015-08-18 DIAGNOSIS — E119 Type 2 diabetes mellitus without complications: Secondary | ICD-10-CM

## 2015-08-18 NOTE — Progress Notes (Signed)
Thanks [name]. I placed a referral to Ophthalmology. I will let [name] know.  [name].

## 2015-08-18 NOTE — Progress Notes (Signed)
Patient ID: [name], female   DOB: April 16, 1956, 60 y.o.   MRN: [id number] Results of retinal images received and state they are "inadequate to assess the presence of retinal pathology. Dilation might be required." Request referral to opthalmology. Patient has seen Dr. Len Blalock in the past with diagnosis of cataracts.

## 2015-09-01 ENCOUNTER — Encounter: Payer: Medicare HMO | Admitting: Internal Medicine

## 2015-09-30 ENCOUNTER — Other Ambulatory Visit: Payer: Self-pay | Admitting: *Deleted

## 2015-09-30 ENCOUNTER — Other Ambulatory Visit: Payer: Self-pay

## 2015-09-30 MED ORDER — PROMETHAZINE HCL 12.5 MG PO TABS: ORAL_TABLET | ORAL | Status: DC

## 2015-09-30 NOTE — Telephone Encounter (Signed)
Request sent to MD

## 2015-09-30 NOTE — Telephone Encounter (Signed)
Pt requesting promethazine to be filled @ [facility] on [address].

## 2015-10-16 ENCOUNTER — Encounter: Payer: Self-pay | Admitting: *Deleted

## 2015-10-28 ENCOUNTER — Other Ambulatory Visit: Payer: Self-pay | Admitting: Internal Medicine

## 2015-10-28 NOTE — Telephone Encounter (Signed)
Needs appt asap

## 2015-11-17 ENCOUNTER — Encounter: Payer: Medicare Other | Admitting: Internal Medicine

## 2015-11-26 ENCOUNTER — Other Ambulatory Visit: Payer: Self-pay | Admitting: Oncology

## 2015-12-01 NOTE — Addendum Note (Signed)
Addended by: Hulan Fray on: 12/01/2015 06:57 PM   Modules accepted: Orders

## 2015-12-09 ENCOUNTER — Other Ambulatory Visit: Payer: Self-pay | Admitting: *Deleted

## 2015-12-09 MED ORDER — LISINOPRIL-HYDROCHLOROTHIAZIDE 20-12.5 MG PO TABS: 2.0000 | ORAL_TABLET | Freq: Every day | ORAL | 1 refills | Status: DC

## 2015-12-09 NOTE — Telephone Encounter (Signed)
Last seen in clinic 07/2015

## 2016-01-01 ENCOUNTER — Other Ambulatory Visit: Payer: Self-pay | Admitting: Internal Medicine

## 2016-01-14 ENCOUNTER — Encounter: Payer: Medicare Other | Admitting: Internal Medicine

## 2016-01-29 ENCOUNTER — Other Ambulatory Visit: Payer: Self-pay | Admitting: Internal Medicine

## 2016-02-03 NOTE — Telephone Encounter (Signed)
Patient will need to come to clinic to be evaluated for nausea. Did she say why she needs a refill on this? I would feel more comfortable is she is having persistent n/v that she be seen by a provider.  Thanks, Jeneen Rinks

## 2016-02-08 ENCOUNTER — Other Ambulatory Visit: Payer: Self-pay | Admitting: Internal Medicine

## 2016-02-08 NOTE — Telephone Encounter (Signed)
promethazine (PHENERGAN) 12.5 MG tablet Refill request

## 2016-02-18 ENCOUNTER — Ambulatory Visit (INDEPENDENT_AMBULATORY_CARE_PROVIDER_SITE_OTHER): Payer: Medicare HMO | Admitting: Internal Medicine

## 2016-02-18 VITALS — BP 139/77 | HR 72 | Temp 97.8°F | Ht 60.0 in | Wt 114.6 lb

## 2016-02-18 DIAGNOSIS — Z7984 Long term (current) use of oral hypoglycemic drugs: Secondary | ICD-10-CM | POA: Diagnosis not present

## 2016-02-18 DIAGNOSIS — I10 Essential (primary) hypertension: Secondary | ICD-10-CM

## 2016-02-18 DIAGNOSIS — E119 Type 2 diabetes mellitus without complications: Secondary | ICD-10-CM

## 2016-02-18 DIAGNOSIS — Z79899 Other long term (current) drug therapy: Secondary | ICD-10-CM

## 2016-02-18 DIAGNOSIS — Z0000 Encounter for general adult medical examination without abnormal findings: Secondary | ICD-10-CM

## 2016-02-18 DIAGNOSIS — Z23 Encounter for immunization: Secondary | ICD-10-CM | POA: Diagnosis not present

## 2016-02-18 DIAGNOSIS — Z87891 Personal history of nicotine dependence: Secondary | ICD-10-CM

## 2016-02-18 LAB — GLUCOSE, CAPILLARY: Glucose-Capillary: 153 mg/dL — ABNORMAL HIGH (ref 65–99)

## 2016-02-18 LAB — POCT GLYCOSYLATED HEMOGLOBIN (HGB A1C): Hemoglobin A1C: 8.4

## 2016-02-18 MED ORDER — PROMETHAZINE HCL 12.5 MG PO TABS: 12.5000 mg | ORAL_TABLET | Freq: Four times a day (QID) | ORAL | 0 refills | Status: DC | PRN

## 2016-02-18 MED ORDER — CANAGLIFLOZIN 100 MG PO TABS: 100.0000 mg | ORAL_TABLET | Freq: Every day | ORAL | 3 refills | Status: DC

## 2016-02-18 NOTE — Progress Notes (Signed)
   CC: Routine clinic follow-up HPI: Ms. Crystaline Vester is a 60 y.o. female with a h/o of hypertension, hyperlipidemia, diabetes mellitus, DVT and sickle cell trait who presents for routine clinic follow-up. She has no specific complaints or questions today. She denies headache, shortness of breath, chest pain or vomiting. She does endorse nausea and abdominal pain which is secondary to her chronic pancreatitis. She denies constipation, dysuria or polyuria. She denies night sweats, fever, fatigue or weight loss.    Review of Systems: A complete ROS was negative except as per HPI.  Physical Exam: There were no vitals filed for this visit. General appearance: alert and cooperative Head: Normocephalic, without obvious abnormality, atraumatic Lungs: clear to auscultation bilaterally Heart: regular rate and rhythm, S1, S2 normal, no murmur, click, rub or gallop Abdomen: soft, non-tender; bowel sounds normal; no masses,  no organomegaly Extremities: extremities normal, atraumatic, no cyanosis or edema  Assessment & Plan:  See encounters tab for problem based medical decision making. Patient seen with Dr. Evette Doffing  Signed: Ophelia Shoulder, MD 02/18/2016, 1:22 PM  Pager: [phone]

## 2016-02-18 NOTE — Assessment & Plan Note (Addendum)
Patient has a history of type 2 diabetes mellitus. Currently, her medications include metformin 1000 mg twice a day. Her most recent hemoglobin A1c on 07/02/2015 was 8.8. Hemoglobin A1c from today is 8.4. Overall, the patient is extremely healthy and doing well with her diabetes and hypertension and I think she would benefit from an additional agent with a goal hemoglobin A1c of approximately 7.0. I discussed this with her and she was agreeable to this plan. At this time we'll start Invokana 100 mg once daily. -- Continue metformin 1000 mg twice a day -- Invokana 100mg  once daily -- Continue pravastatin 40 mg once daily for prevention of cardiovascular disease

## 2016-02-18 NOTE — Assessment & Plan Note (Addendum)
Patient recently had a mammogram on 07/17/2015 which showed no evidence of malignancy. She is currently overdue for colonoscopy. Additionally, the patient had a total hysterectomy in the past and stated that the OB/GYN told her she would not need Pap smears in the future. After discussing with the patient today in clinic she would like to arrange for colonoscopy to be performed sometime in early January. I will have a clinic appointment with her before this time and at our next appointment we will arrange for this to be performed. Additionally, the patient endorsed that she would like the flu shot today. -- Repeat screening mammography in one year from date of 07/17/2015 -- Flu shot -- Arrange for colonoscopy at next clinic appointment

## 2016-02-18 NOTE — Assessment & Plan Note (Addendum)
Patient has history of hypertension. Her current medications include lisinopril-hydrochlorothiazide 20-12.5 twice a day. Her blood pressure as measured in the office was 139/77. Currently based on the patient's history her goal blood pressure would be less than or equal to 140/80. At this time I will not add an additional antihypertensive medication as she seems well controlled on her current regimen. We'll continue to monitor and if her blood pressures trend upward we will consider adding an additional antihypertensive medication at her next visit. -- Continue lisinopril-hydrochlorothiazide twice a day -- Encourage low salt diet and exercise

## 2016-02-18 NOTE — Patient Instructions (Addendum)
It was a pleasure seeing you today. Thank you for choosing [facility] for your healthcare needs.   Today we are starting a new medication called Invokana. You are to take this medication once daily with breakfast. Continue to take your metformin and lisinopril as you have been doing. I will arrange for you to follow up in clinic in 3 months. If you need an appointment sooner or notice any side effects from the [facility] please call and make an appointment earlier.

## 2016-02-19 NOTE — Progress Notes (Signed)
Internal Medicine Clinic Attending  I saw and evaluated the patient.  I personally confirmed the key portions of the history and exam documented by Dr. Lovena Le and I reviewed pertinent patient test results.  The assessment, diagnosis, and plan were formulated together and I agree with the documentation in the resident's note.  One correction: patient presented for follow up of diabetes.

## 2016-02-19 NOTE — Telephone Encounter (Signed)
A user error has taken place: encounter opened in error, closed for administrative reasons.

## 2016-03-02 ENCOUNTER — Other Ambulatory Visit: Payer: Self-pay | Admitting: Internal Medicine

## 2016-03-02 DIAGNOSIS — E119 Type 2 diabetes mellitus without complications: Secondary | ICD-10-CM

## 2016-03-18 ENCOUNTER — Other Ambulatory Visit: Payer: Self-pay | Admitting: Internal Medicine

## 2016-03-18 DIAGNOSIS — E119 Type 2 diabetes mellitus without complications: Secondary | ICD-10-CM

## 2016-03-21 ENCOUNTER — Other Ambulatory Visit: Payer: Self-pay

## 2016-03-21 NOTE — Telephone Encounter (Signed)
Patient called and made aware that refill request is pending waiting on Dr. To refill

## 2016-03-21 NOTE — Telephone Encounter (Signed)
promethazine (PHENERGAN) 12.5 MG tablet, refill request.

## 2016-04-19 ENCOUNTER — Other Ambulatory Visit: Payer: Self-pay | Admitting: Internal Medicine

## 2016-04-19 DIAGNOSIS — E119 Type 2 diabetes mellitus without complications: Secondary | ICD-10-CM

## 2016-04-20 ENCOUNTER — Other Ambulatory Visit: Payer: Self-pay

## 2016-04-20 DIAGNOSIS — E119 Type 2 diabetes mellitus without complications: Secondary | ICD-10-CM

## 2016-04-20 NOTE — Telephone Encounter (Signed)
She needs to make an appointment if she is having n/v. I do not want to refill this medication. She should be seen in [facility] clinic if this is acute.

## 2016-04-20 NOTE — Telephone Encounter (Signed)
Call to patient to report that MD denied phenergan Rx she would need to come in for [facility] visit.  She reports that she is not vomiting at all still has occasional nausea she has an appointment 05/05/2016 is requesting just enough promethizine so that she will have them if she needs them over the holiday. Please advise

## 2016-04-21 ENCOUNTER — Telehealth: Payer: Self-pay

## 2016-04-21 MED ORDER — PROMETHAZINE HCL 12.5 MG PO TABS: 12.5000 mg | ORAL_TABLET | Freq: Three times a day (TID) | ORAL | 0 refills | Status: DC | PRN

## 2016-04-21 NOTE — Telephone Encounter (Signed)
Phenergan RX called to phamacy patient called made aware that RX called in

## 2016-04-21 NOTE — Telephone Encounter (Signed)
Provided patient with 10 pills of promethazine for over the holidays. She has an appointment scheduled in early January.

## 2016-04-21 NOTE — Telephone Encounter (Signed)
OK,  I will send this in later this am

## 2016-04-26 ENCOUNTER — Other Ambulatory Visit: Payer: Self-pay | Admitting: Internal Medicine

## 2016-04-28 ENCOUNTER — Encounter: Payer: Self-pay | Admitting: Internal Medicine

## 2016-04-28 ENCOUNTER — Other Ambulatory Visit: Payer: Self-pay | Admitting: Student in an Organized Health Care Education/Training Program

## 2016-04-28 DIAGNOSIS — F1120 Opioid dependence, uncomplicated: Secondary | ICD-10-CM | POA: Insufficient documentation

## 2016-05-04 ENCOUNTER — Telehealth: Payer: Self-pay | Admitting: Internal Medicine

## 2016-05-04 NOTE — Telephone Encounter (Signed)
APT. REMINDER CALL, LMTCB °

## 2016-05-05 ENCOUNTER — Ambulatory Visit (INDEPENDENT_AMBULATORY_CARE_PROVIDER_SITE_OTHER): Payer: Medicare HMO | Admitting: Internal Medicine

## 2016-05-05 ENCOUNTER — Encounter: Payer: Self-pay | Admitting: Dietician

## 2016-05-05 ENCOUNTER — Encounter: Payer: Self-pay | Admitting: Internal Medicine

## 2016-05-05 ENCOUNTER — Encounter (INDEPENDENT_AMBULATORY_CARE_PROVIDER_SITE_OTHER): Payer: Self-pay

## 2016-05-05 VITALS — BP 140/80 | HR 84 | Temp 97.5°F | Ht 60.0 in | Wt 116.4 lb

## 2016-05-05 DIAGNOSIS — E119 Type 2 diabetes mellitus without complications: Secondary | ICD-10-CM

## 2016-05-05 DIAGNOSIS — I10 Essential (primary) hypertension: Secondary | ICD-10-CM

## 2016-05-05 DIAGNOSIS — Z79899 Other long term (current) drug therapy: Secondary | ICD-10-CM

## 2016-05-05 DIAGNOSIS — Z87891 Personal history of nicotine dependence: Secondary | ICD-10-CM

## 2016-05-05 DIAGNOSIS — Z7984 Long term (current) use of oral hypoglycemic drugs: Secondary | ICD-10-CM | POA: Diagnosis not present

## 2016-05-05 DIAGNOSIS — Z0000 Encounter for general adult medical examination without abnormal findings: Secondary | ICD-10-CM

## 2016-05-05 DIAGNOSIS — Q453 Other congenital malformations of pancreas and pancreatic duct: Secondary | ICD-10-CM

## 2016-05-05 LAB — POCT GLYCOSYLATED HEMOGLOBIN (HGB A1C): Hemoglobin A1C: 8.6

## 2016-05-05 LAB — GLUCOSE, CAPILLARY: Glucose-Capillary: 128 mg/dL — ABNORMAL HIGH (ref 65–99)

## 2016-05-05 MED ORDER — LISINOPRIL-HYDROCHLOROTHIAZIDE 20-12.5 MG PO TABS: 2.0000 | ORAL_TABLET | Freq: Every day | ORAL | 0 refills | Status: DC

## 2016-05-05 MED ORDER — LISINOPRIL-HYDROCHLOROTHIAZIDE 20-12.5 MG PO TABS: 2.0000 | ORAL_TABLET | Freq: Every day | ORAL | 3 refills | Status: DC

## 2016-05-05 MED ORDER — PIOGLITAZONE HCL 15 MG PO TABS: 15.0000 mg | ORAL_TABLET | Freq: Every day | ORAL | 3 refills | Status: DC

## 2016-05-05 MED ORDER — PROMETHAZINE HCL 12.5 MG PO TABS: 12.5000 mg | ORAL_TABLET | Freq: Three times a day (TID) | ORAL | 2 refills | Status: DC | PRN

## 2016-05-05 NOTE — Assessment & Plan Note (Addendum)
The patient's blood pressure has been at goal and well-controlled at the previous visit. Previously, her in office blood pressure was 139/77. Overall, her blood pressures of been well-controlled she is currently at goal. Her blood pressure goal is systolic less than [id number]. She is tolerating her medication appropriately. She does need a refill which we will provide today. -- Continue lisinopril-hydrochlorothiazide.

## 2016-05-05 NOTE — Patient Instructions (Addendum)
Please call Dr. Len Blalock at Dr. Coral Else office to reschedule the eye exam appointment that you missed in May 2017.    When you have gone to that appointment, please ask them to fax [name] the report to [phone] and let us know at your next appointment that you went.  Thank you!  [name] (Diabetes Educator)  [phone]

## 2016-05-05 NOTE — Assessment & Plan Note (Addendum)
Patient's most recent mammogram normal. Her last mammography was conducted on 07/17/2015. She will need repeat mammography in approximately one year. At our previous office visit we discussed the necessity of colonoscopy. She was amenable to this plan. We'll arrange for this to be done. She has not had a recent Pap smear and will need this in the near future. -- Colonoscopy -- Mammogram after 07/17/2015 -- Consider Pap smear next appointment

## 2016-05-05 NOTE — Assessment & Plan Note (Signed)
Patient has been diagnosed with pancreas divisim. She follows with gastroenterology at [facility]. She has not seen her gastroenterologist in quite some time since her husband was diagnosed with renal cancer. I discussed with her the importance of continuing to follow-up with this physician. She is amenable to this plan. She said she will arrange an appointment to be seen in the next month or two. -- Follow-up with gastroenterology

## 2016-05-05 NOTE — Progress Notes (Addendum)
   CC: Mid epigastric pain HPI: Ms. Bana Vecchione is a 61 y.o. female with a h/o of hypertension, hyperlipidemia, diabetes mellitus, DVT and sickle cell trait who presents who presents with mild midepigastric abdominal pain and for routine clinic follow-up. She states that she generally gets this pain for several days once or twice a month and it is secondary to her pancreatic disease. Her pain is no different or unusual today. There are no red flag symptoms associated with this pain. She thinks that she'll be able to manage this at home with bowel rest. She has had some heartburn pain but no chest pain. She denies shortness of breath. She denies constipation. She has some diarrhea but this is in association with pancreatic symptoms. She has no weight loss, night sweats or increased fatigue.   Review of Systems: A complete ROS was negative except as per HPI.  Physical Exam: There were no vitals filed for this visit. General appearance: alert and cooperative Head: Normocephalic, without obvious abnormality, atraumatic Lungs: clear to auscultation bilaterally Heart: regular rate and rhythm, S1, S2 normal, no murmur, click, rub or gallop Abdomen: soft, non-tender; bowel sounds normal; no masses,  no organomegaly Extremities: extremities normal, atraumatic, no cyanosis or edema  Assessment & Plan:  See encounters tab for problem based medical decision making. Patient Discussed with Dr. Evette Doffing  Signed: Ophelia Shoulder, MD 05/05/2016, 1:33 PM  Pager: [phone]

## 2016-05-05 NOTE — Patient Instructions (Addendum)
Please call Dr. Len Blalock at Dr. Coral Else office to reschedule the diabetes eye exam appointment that you missed in May 2017.    When you have gone to that appointment, please ask them to fax [name] the report to [phone] and let us know at your next appointment that you went.  It was a pleasure seeing you today. Thank you for choosing [facility] for your healthcare needs.   I started a new medication for you called pioglitazone. You're to take this medication once daily for the treatment of diabetes. Continue to take your metformin twice daily for diabetes. Continue to take your medication for your high blood pressure. Please ensure you have a colonoscopy. Please ensure you follow up with your pancreas doctor at [place].   You will have an appointment scheduled to see me again in 3 months. If you notice any side effects of your new medication or have any questions please call the clinic.

## 2016-05-05 NOTE — Assessment & Plan Note (Addendum)
The patient has type 2 diabetes. At her previous office visit Invokana was added in addition to her metformin. However, at today's visit she told me she was unable to afford this new medication and has not been taking it. Her hemoglobin A1c in the office today was 8.6. Her previous 2 readings have been 8.8 and 8.4. I do think she would benefit from a second diabetes medication. She has no history of heart failure. At this time we'll start the patient on pioglitazone 15mg  once daily and continue with metformin 1000 mg twice daily. In 3 months when she returns to clinic reassess and see if an SGLT-2 inhibitor has been added to the [facility] formulary which would make this a medication affordable for her. -- Metformin 1000 mg twice a day -- pioglitazone 15mg  once daily  -- Repeat hemoglobin A1c in 3 months

## 2016-05-06 ENCOUNTER — Telehealth: Payer: Self-pay | Admitting: Internal Medicine

## 2016-05-06 NOTE — Telephone Encounter (Signed)
Would recommend she followup at [facility] given the majority of her GI care is there, particularly the most recent encounters

## 2016-05-06 NOTE — Progress Notes (Signed)
Internal Medicine Clinic Attending  Case discussed with Dr. Taylor at the time of the visit.  We reviewed the resident's history and exam and pertinent patient test results.  I agree with the assessment, diagnosis, and plan of care documented in the resident's note. 

## 2016-05-06 NOTE — Telephone Encounter (Signed)
Please advise regarding scheduling for colon here. Per last EGD note in 2015 it states to follow up with GI at [facility].

## 2016-05-09 NOTE — Telephone Encounter (Signed)
See note from Dr. Hilarie Fredrickson below regarding colon.

## 2016-05-13 ENCOUNTER — Other Ambulatory Visit: Payer: Self-pay

## 2016-05-23 ENCOUNTER — Other Ambulatory Visit: Payer: Self-pay | Admitting: Internal Medicine

## 2016-05-23 DIAGNOSIS — Z1211 Encounter for screening for malignant neoplasm of colon: Secondary | ICD-10-CM

## 2016-05-23 HISTORY — DX: Encounter for screening for malignant neoplasm of colon: Z12.11

## 2016-05-25 ENCOUNTER — Other Ambulatory Visit: Payer: Self-pay | Admitting: Internal Medicine

## 2016-06-07 ENCOUNTER — Telehealth: Payer: Self-pay

## 2016-06-22 ENCOUNTER — Telehealth: Payer: Self-pay | Admitting: Internal Medicine

## 2016-06-22 NOTE — Telephone Encounter (Signed)
I received patient called requesting referral to pain clinic. I will further evaluate the necessity of this before placing a referral.

## 2016-07-15 ENCOUNTER — Telehealth: Payer: Self-pay | Admitting: Internal Medicine

## 2016-07-15 NOTE — Telephone Encounter (Signed)
APT. REMINDER CALL, LMTCB °

## 2016-07-18 ENCOUNTER — Ambulatory Visit (INDEPENDENT_AMBULATORY_CARE_PROVIDER_SITE_OTHER): Payer: Medicare HMO | Admitting: Internal Medicine

## 2016-07-18 ENCOUNTER — Ambulatory Visit (HOSPITAL_COMMUNITY)
Admission: RE | Admit: 2016-07-18 | Discharge: 2016-07-18 | Disposition: A | Discharge status: Home / Self Care | Payer: Medicare HMO | Source: Ambulatory Visit | Attending: Internal Medicine | Admitting: Internal Medicine

## 2016-07-18 VITALS — BP 135/85 | HR 76 | Temp 98.0°F | Ht 60.0 in | Wt 114.4 lb

## 2016-07-18 DIAGNOSIS — H9190 Unspecified hearing loss, unspecified ear: Secondary | ICD-10-CM | POA: Diagnosis not present

## 2016-07-18 DIAGNOSIS — H5500 Unspecified nystagmus: Secondary | ICD-10-CM | POA: Diagnosis not present

## 2016-07-18 DIAGNOSIS — I10 Essential (primary) hypertension: Secondary | ICD-10-CM | POA: Diagnosis not present

## 2016-07-18 DIAGNOSIS — Z7984 Long term (current) use of oral hypoglycemic drugs: Secondary | ICD-10-CM | POA: Diagnosis not present

## 2016-07-18 DIAGNOSIS — Z79899 Other long term (current) drug therapy: Secondary | ICD-10-CM | POA: Diagnosis not present

## 2016-07-18 DIAGNOSIS — E119 Type 2 diabetes mellitus without complications: Secondary | ICD-10-CM

## 2016-07-18 DIAGNOSIS — Z79891 Long term (current) use of opiate analgesic: Secondary | ICD-10-CM | POA: Diagnosis not present

## 2016-07-18 DIAGNOSIS — R51 Headache: Secondary | ICD-10-CM

## 2016-07-18 DIAGNOSIS — Z8673 Personal history of transient ischemic attack (TIA), and cerebral infarction without residual deficits: Secondary | ICD-10-CM

## 2016-07-18 DIAGNOSIS — R519 Headache, unspecified: Secondary | ICD-10-CM | POA: Insufficient documentation

## 2016-07-18 DIAGNOSIS — G8929 Other chronic pain: Secondary | ICD-10-CM | POA: Diagnosis not present

## 2016-07-18 DIAGNOSIS — Z87891 Personal history of nicotine dependence: Secondary | ICD-10-CM

## 2016-07-18 DIAGNOSIS — R002 Palpitations: Secondary | ICD-10-CM | POA: Diagnosis not present

## 2016-07-18 DIAGNOSIS — Q453 Other congenital malformations of pancreas and pancreatic duct: Secondary | ICD-10-CM

## 2016-07-18 HISTORY — DX: Palpitations: R00.2

## 2016-07-18 MED ORDER — LISINOPRIL-HYDROCHLOROTHIAZIDE 20-12.5 MG PO TABS: 2.0000 | ORAL_TABLET | Freq: Every day | ORAL | 3 refills | Status: DC

## 2016-07-18 MED ORDER — PROMETHAZINE HCL 12.5 MG PO TABS: 12.5000 mg | ORAL_TABLET | Freq: Three times a day (TID) | ORAL | 2 refills | Status: DC | PRN

## 2016-07-18 NOTE — Assessment & Plan Note (Signed)
Patient with new onset palpitations for the last month. Patient attributes these to pioglitazone which she started around the same time as her palpitations began. She endorses a few episodes a week that are not associated with chest pain, shortness of breath, syncope, dizziness, nausea. She denies prior similar symptoms or thyroid issues in the past. She denies changes in skin, hair, bowel movements, appetite, weight loss/gain. Last TSH was in 06/2014 and was 0.9.   Exam reveals RRR, no murmurs, no edema. No info was found to draw correlation between pioglitazone and palpitations.   Plan: --f/u TSH, T4 --EKG - personally reviewed - no changes from prior, NSR, no acute ST or T wave changes

## 2016-07-18 NOTE — Progress Notes (Signed)
CC: chronic pain  HPI:  [name] is a 61 y.o. with a PMH of HTN, HLD, DM, sickle cell trait, beta thalassemia, h/o CVA in '93 w/o residual deficits presenting to clinic for diabetes, her chronic pain from pancreas divisum, heart palpitations, and new onset headaches.  DM: Patient at last visit was started on pioglitazone; she states that once she started taking it, she started having episodic palpitations lasting about 30 minutes. She has stopped taking it and has not had further palpitations in the last week or so. She has continued taking metformin 1000mg  BID. She states that she has started a diet and exercise plan that has worked well with her in the past in controlling her diabetes and would like to continue this at this time before starting new medicines. She denies polyuria, polydypsia, polyphagia or hypoglycemic episodes.   HTN: Patient states she has been compliant with her lisinopril-HCTZ; she does endorse new posterior headaches for the last month or so. She endorses dizziness and intermittent decreased hearing for a few minutes during headaches. She has not tried anything for the headaches; they last anywhere from an hour to an entire day and occur about 1-2 times per week. They are not associated with syncope, numbness, weakness, facial droop, dysarthria, dysphagia. She denies chest pain, shortness of breath or vision changes. She does endorse episodes of palpitations (not in conjunction with headaches) which she attributes to pioglitazone; the palpitations last about 30 minutes and resolve on their own. She denies other symptoms of hypo or hyperthyroidism.  Palpitations: Patient with new onset palpitations for the last month. Patient attributes these to pioglitazone which she started around the same time as her palpitations began. She endorses a few episodes a week that are not associated with chest pain, shortness of breath, syncope, dizziness, nausea. She denies prior  similar symptoms or thyroid issues in the past. She denies changes in skin, hair, bowel movements, appetite, weight loss/gain.   Headaches: Patient with new onset headaches for about 2 weeks. They are posteriorly located, associated with dizziness, and intermittent decreased hearing for a few minutes at a time. The headaches will last anywhere from an hour to a full day and they seem to begin and relieve on their own. She denies nausea, photophobia, numbness, weakness, facial droop, dysarthria, dysphagia or other neurological symptoms. She has a history of migraines, but states these headaches are dull, different location, and feel overall different from her prior migraines. She has not taken anything OTC for the headaches but has tried going into dark, cold rooms and sleeping as these were previously helpful for her migraines. She states that this intermittently is helpful. She does have a history of a CVA in 1993 without residual weakness.   Chronic pain: Patient is patient of [facility] for her chronic pain; she has changed her insurance which is now not accepted at the clinic. She has been paying out of pocket in the meantime for her monthly visits. She has been stable on Oxycodone 10mg  IR TID PRN. She requests a referral to a different clinic that would accept her insurance.   Please see problem based Assessment and Plan for status of patients chronic conditions.  Past Medical History:  Diagnosis Date  . Asthma    childhood asthma  . Beta thalassemia ([place])   . Blood dyscrasia    thalasemia, sickle cell trait  . Chronic abdominal pain    Unclear etiology, thought to be due to chronic pancreatitis previously in  setting of her pancreatic divisum - however, EUS and EGD performed at [place]  (10/05/2011) showing normal esophageall, gastric, duodenal mucosa. EUS showing no pancreatic masses, cysts, or changes of chronic pancreatitis. Biliary system nondilated and had no endosonographic  abnormalities.  . CVA (cerebral infarction) 1993   Per report by patient she had a stroke and prolonged rehab course eventually resolving her right sided weakness, as per her hx obtained 08/2007  . HLD (hyperlipidemia) 2009  . Hypertension   . Migraine headache    "q other year now" (11/20/2012)  . Pancreatic divisum    S/P ERCP with stenting 07/2010 at [facility], then stent removal (08/05/2010)  . Pneumonia ~ 1969  . Pulmonary embolus ([place]) 08/2004   Two areas of V/Q mismatch. Findings compatible  with high probability for pulmonary embolus.; pt was on coumadin for 1 year  . Seizures ([place])    "after the stroke in 1993; none for years now" 11/20/2012)  . Sickle cell trait ([place])   . Stroke [facility]) 1993   very small amount of left sided weakness  . Thalassemia   . Type II diabetes mellitus ([place]) 2010   well controlled  . Uterine cancer ([place])     Review of Systems:   Review of Systems  Constitutional: Negative for chills, fever, malaise/fatigue and weight loss.  HENT: Positive for hearing loss. Negative for ear pain.   Eyes: Negative for blurred vision, double vision and photophobia.  Respiratory: Negative for shortness of breath.   Cardiovascular: Positive for palpitations. Negative for chest pain and leg swelling.  Gastrointestinal: Positive for abdominal pain (chronic) and nausea (chronic). Negative for constipation and diarrhea.  Genitourinary: Negative for frequency.  Musculoskeletal: Negative for neck pain.  Skin: Positive for itching.  Neurological: Positive for dizziness and headaches. Negative for tingling, sensory change, speech change, focal weakness, loss of consciousness and weakness.  Endo/Heme/Allergies: Negative for polydipsia.    Physical Exam:  Vitals:   07/18/16 0832 07/18/16 0941  BP: (!) 161/70 135/85  Pulse: 76   Temp: 98 F (36.7 C)   TempSrc: Oral   SpO2: 98%   Weight: 114 lb 6.4 oz (51.9 kg)   Height: 5' (1.524 m)    Physical Exam  Constitutional:  She is oriented to person, place, and time. She appears well-developed and well-nourished. No distress.  HENT:  Head: Normocephalic and atraumatic.  Eyes: Conjunctivae and EOM are normal. Pupils are equal, round, and reactive to light.  Right beating nystagmus R>L  Neck: Normal range of motion. Neck supple. No tracheal deviation present. No thyromegaly present.  No spinal or muscular tenderness  Cardiovascular: Normal rate, regular rhythm, normal heart sounds and intact distal pulses.  Exam reveals no gallop and no friction rub.   No murmur heard. Pulmonary/Chest: Effort normal and breath sounds normal. No respiratory distress. She has no wheezes. She has no rales.  Abdominal: Soft. Bowel sounds are normal. She exhibits no distension. There is no tenderness.  Musculoskeletal: Normal range of motion. She exhibits no edema.  Neurological: She is alert and oriented to person, place, and time. No cranial nerve deficit or sensory deficit. She exhibits normal muscle tone.  Skin: Skin is warm and dry. Capillary refill takes less than 2 seconds. She is not diaphoretic. No erythema.  Psychiatric: She has a normal mood and affect. Her behavior is normal. Judgment and thought content normal.    Assessment & Plan:   See Encounters Tab for problem based charting.   Patient discussed with  Dr. Carmel [place], MD Internal Medicine PGY1

## 2016-07-18 NOTE — Assessment & Plan Note (Addendum)
Patient is patient of [facility] for her chronic pain; she has changed her insurance which is now not accepted at the clinic. She has been paying out of pocket in the meantime for her monthly visits. She has been stable on Oxycodone 10mg  IR TID PRN. She requests a referral to a different clinic that would accept her insurance.   Plan: --referral to pain clinic placed --patient will continue to go to [facility] in the meantime --Has GI appt with [facility] next month

## 2016-07-18 NOTE — Patient Instructions (Signed)
I refilled your blood pressure and nausea medicines.  Today we checked your thyroid level since you've been experiencing fluttering in your heart; we will let you know of the results and if anything else needs to be done.  We will set you up with an appointment for a brain MRI for your headaches; like we talked about, if you have numbness, weakness, trouble swallowing, trouble speaking, facial drooping, please go to the emergency room.   I have placed a referral for a pain clinic and we will work on getting you placed.   It was a pleasure meeting you today!

## 2016-07-18 NOTE — Assessment & Plan Note (Signed)
Patient at last visit was started on pioglitazone; she states that once she started taking it, she started having episodic palpitations lasting about 30 minutes. She has stopped taking it and has not had further palpitations in the last week or so. She has continued taking metformin 1000mg  BID. She states that she has started a diet and exercise plan that has worked well with her in the past in controlling her diabetes and would like to continue this at this time before starting new medicines. She denies polyuria, polydypsia, polyphagia or hypoglycemic episodes.   Plan: --continue metformin 1000mg  BID --d/c pioglitazone per patient preference - no information was found on pioglitazone causing palpitations; may be able to re-introduce in future if needed. --patient to continue her diet and exercise plan for --f/u with PCP in May to check A1c and for further management

## 2016-07-18 NOTE — Assessment & Plan Note (Addendum)
HTN: Patient states she has been compliant with her lisinopril-HCTZ; she does endorse new posterior headaches for the last month or so. She endorses dizziness and intermittent decreased hearing for a few minutes during headaches. She has not tried anything for the headaches; they last anywhere from an hour to an entire day and occur about 1-2 times per week. They are not associated with syncope, numbness, weakness, facial droop, dysarthria, dysphagia. She denies chest pain, shortness of breath or vision changes. She does endorse episodes of palpitations (not in conjunction with headaches) which she attributes to pioglitazone; the palpitations last about 30 minutes and resolve on their own. She denies other symptoms of hypo or hyperthyroidism.  Repeat BP in office was 135/85 - patient has not taken her BP med today.  Plan: --EKG - personally reviewed - unchanged from prior, no acute ST or T wave changes --f/u TSH, T4 --refilled lisinopril-HCTZ

## 2016-07-18 NOTE — Assessment & Plan Note (Addendum)
Patient with new onset headaches for about 2 weeks. They are posteriorly located, associated with dizziness, and intermittent decreased hearing for a few minutes at a time. The headaches will last anywhere from an hour to a full day and they seem to begin and relieve on their own. She denies nausea, photophobia, numbness, weakness, facial droop, dysarthria, dysphagia or other neurological symptoms. She has a history of migraines, but states these headaches are dull, different location, and feel overall different from her prior migraines. She has not taken anything OTC for the headaches but has tried going into dark, cold rooms and sleeping as these were previously helpful for her migraines. She states that this intermittently is helpful. She does have a history of a CVA in 1993 without residual weakness.   Exam did not reveal acute neurological findings. She does have a right beating nystagmus without current symptoms of vertigo. There is concern for CVA with her history and risk factors. She is currently without symptoms so we will proceed to an MRI at this time.   Plan: --MRI wo contrast --patient prefers to not take OTCs or other meds right now to help control her symptoms when they do occur --continue risk factor modification of HTN, HLD, DM; consider switching to high intensity statin from pravastatin

## 2016-07-19 LAB — TSH: TSH: 0.608 u[IU]/mL (ref 0.450–4.500)

## 2016-07-19 LAB — T4, FREE: Free T4: 1.46 ng/dL (ref 0.82–1.77)

## 2016-07-19 NOTE — Progress Notes (Signed)
Internal Medicine Clinic Attending  Case discussed with Dr. Svalina  at the time of the visit.  We reviewed the resident's history and exam and pertinent patient test results.  I agree with the assessment, diagnosis, and plan of care documented in the resident's note.  

## 2016-07-21 ENCOUNTER — Ambulatory Visit (HOSPITAL_COMMUNITY)
Admission: RE | Admit: 2016-07-21 | Discharge: 2016-07-21 | Disposition: A | Discharge status: Home / Self Care | Payer: Medicare HMO | Source: Ambulatory Visit | Attending: Internal Medicine | Admitting: Internal Medicine

## 2016-07-21 DIAGNOSIS — G3189 Other specified degenerative diseases of nervous system: Secondary | ICD-10-CM | POA: Diagnosis not present

## 2016-07-21 DIAGNOSIS — R51 Headache: Secondary | ICD-10-CM | POA: Insufficient documentation

## 2016-07-21 DIAGNOSIS — R519 Headache, unspecified: Secondary | ICD-10-CM

## 2016-08-01 DIAGNOSIS — Z8719 Personal history of other diseases of the digestive system: Secondary | ICD-10-CM | POA: Diagnosis not present

## 2016-08-01 DIAGNOSIS — K219 Gastro-esophageal reflux disease without esophagitis: Secondary | ICD-10-CM | POA: Diagnosis not present

## 2016-08-01 DIAGNOSIS — Z800 Family history of malignant neoplasm of digestive organs: Secondary | ICD-10-CM | POA: Diagnosis not present

## 2016-08-01 DIAGNOSIS — D569 Thalassemia, unspecified: Secondary | ICD-10-CM | POA: Diagnosis not present

## 2016-08-01 DIAGNOSIS — Z1211 Encounter for screening for malignant neoplasm of colon: Secondary | ICD-10-CM | POA: Diagnosis not present

## 2016-08-01 DIAGNOSIS — Z09 Encounter for follow-up examination after completed treatment for conditions other than malignant neoplasm: Secondary | ICD-10-CM | POA: Diagnosis not present

## 2016-08-01 DIAGNOSIS — D509 Iron deficiency anemia, unspecified: Secondary | ICD-10-CM | POA: Diagnosis not present

## 2016-08-01 DIAGNOSIS — I10 Essential (primary) hypertension: Secondary | ICD-10-CM | POA: Diagnosis not present

## 2016-08-01 DIAGNOSIS — Z9889 Other specified postprocedural states: Secondary | ICD-10-CM | POA: Diagnosis not present

## 2016-08-01 DIAGNOSIS — E119 Type 2 diabetes mellitus without complications: Secondary | ICD-10-CM | POA: Diagnosis not present

## 2016-08-01 DIAGNOSIS — Z87798 Personal history of other (corrected) congenital malformations: Secondary | ICD-10-CM | POA: Diagnosis not present

## 2016-08-16 DIAGNOSIS — K861 Other chronic pancreatitis: Secondary | ICD-10-CM | POA: Diagnosis not present

## 2016-08-16 DIAGNOSIS — M542 Cervicalgia: Secondary | ICD-10-CM | POA: Diagnosis not present

## 2016-08-16 DIAGNOSIS — R1012 Left upper quadrant pain: Secondary | ICD-10-CM | POA: Diagnosis not present

## 2016-08-16 DIAGNOSIS — Z79899 Other long term (current) drug therapy: Secondary | ICD-10-CM | POA: Diagnosis not present

## 2016-08-16 DIAGNOSIS — Z5181 Encounter for therapeutic drug level monitoring: Secondary | ICD-10-CM | POA: Diagnosis not present

## 2016-08-16 DIAGNOSIS — M5412 Radiculopathy, cervical region: Secondary | ICD-10-CM | POA: Diagnosis not present

## 2016-08-30 DIAGNOSIS — R1012 Left upper quadrant pain: Principal | ICD-10-CM | POA: Insufficient documentation

## 2016-08-30 DIAGNOSIS — I10 Essential (primary) hypertension: Secondary | ICD-10-CM | POA: Insufficient documentation

## 2016-08-30 DIAGNOSIS — D561 Beta thalassemia: Secondary | ICD-10-CM | POA: Diagnosis not present

## 2016-08-30 DIAGNOSIS — Z886 Allergy status to analgesic agent status: Secondary | ICD-10-CM | POA: Insufficient documentation

## 2016-08-30 DIAGNOSIS — E119 Type 2 diabetes mellitus without complications: Secondary | ICD-10-CM | POA: Insufficient documentation

## 2016-08-30 DIAGNOSIS — Z882 Allergy status to sulfonamides status: Secondary | ICD-10-CM | POA: Diagnosis not present

## 2016-08-30 DIAGNOSIS — E785 Hyperlipidemia, unspecified: Secondary | ICD-10-CM | POA: Diagnosis not present

## 2016-08-30 DIAGNOSIS — R1013 Epigastric pain: Secondary | ICD-10-CM | POA: Diagnosis not present

## 2016-08-30 DIAGNOSIS — Z91013 Allergy to seafood: Secondary | ICD-10-CM | POA: Insufficient documentation

## 2016-08-30 DIAGNOSIS — Z7984 Long term (current) use of oral hypoglycemic drugs: Secondary | ICD-10-CM | POA: Insufficient documentation

## 2016-08-30 DIAGNOSIS — Z79899 Other long term (current) drug therapy: Secondary | ICD-10-CM | POA: Diagnosis not present

## 2016-08-30 DIAGNOSIS — Z86711 Personal history of pulmonary embolism: Secondary | ICD-10-CM | POA: Diagnosis not present

## 2016-08-30 DIAGNOSIS — Z8673 Personal history of transient ischemic attack (TIA), and cerebral infarction without residual deficits: Secondary | ICD-10-CM | POA: Insufficient documentation

## 2016-08-30 DIAGNOSIS — Z8542 Personal history of malignant neoplasm of other parts of uterus: Secondary | ICD-10-CM | POA: Insufficient documentation

## 2016-08-31 ENCOUNTER — Observation Stay (HOSPITAL_COMMUNITY): Payer: Medicare HMO

## 2016-08-31 ENCOUNTER — Encounter (HOSPITAL_COMMUNITY): Payer: Self-pay

## 2016-08-31 ENCOUNTER — Observation Stay (HOSPITAL_COMMUNITY)
Admission: EM | Admit: 2016-08-31 | Discharge: 2016-09-04 | Disposition: A | Discharge status: Home / Self Care | Payer: Medicare HMO | Attending: Internal Medicine | Admitting: Internal Medicine

## 2016-08-31 DIAGNOSIS — K861 Other chronic pancreatitis: Secondary | ICD-10-CM | POA: Diagnosis present

## 2016-08-31 DIAGNOSIS — R109 Unspecified abdominal pain: Secondary | ICD-10-CM | POA: Diagnosis not present

## 2016-08-31 DIAGNOSIS — R1013 Epigastric pain: Secondary | ICD-10-CM

## 2016-08-31 LAB — URINALYSIS, ROUTINE W REFLEX MICROSCOPIC
BACTERIA UA: NONE SEEN
Bilirubin Urine: NEGATIVE
GLUCOSE, UA: 150 mg/dL — AB
Hgb urine dipstick: NEGATIVE
Ketones, ur: NEGATIVE mg/dL
Nitrite: NEGATIVE
PROTEIN: NEGATIVE mg/dL
SQUAMOUS EPITHELIAL / LPF: NONE SEEN
Specific Gravity, Urine: 1.013 (ref 1.005–1.030)
pH: 6 (ref 5.0–8.0)

## 2016-08-31 LAB — CBC
HCT: 36.5 % (ref 36.0–46.0)
Hemoglobin: 12.5 g/dL (ref 12.0–15.0)
MCH: 26.5 pg (ref 26.0–34.0)
MCHC: 34.2 g/dL (ref 30.0–36.0)
MCV: 77.5 fL — ABNORMAL LOW (ref 78.0–100.0)
PLATELETS: 414 10*3/uL — AB (ref 150–400)
RBC: 4.71 MIL/uL (ref 3.87–5.11)
RDW: 14.4 % (ref 11.5–15.5)
WBC: 9 10*3/uL (ref 4.0–10.5)

## 2016-08-31 LAB — COMPREHENSIVE METABOLIC PANEL
ALK PHOS: 64 U/L (ref 38–126)
ALT: 12 U/L — AB (ref 14–54)
ANION GAP: 9 (ref 5–15)
AST: 19 U/L (ref 15–41)
Albumin: 4.5 g/dL (ref 3.5–5.0)
BILIRUBIN TOTAL: 0.2 mg/dL — AB (ref 0.3–1.2)
BUN: 11 mg/dL (ref 6–20)
CO2: 28 mmol/L (ref 22–32)
CREATININE: 0.78 mg/dL (ref 0.44–1.00)
Calcium: 10.1 mg/dL (ref 8.9–10.3)
Chloride: 101 mmol/L (ref 101–111)
Glucose, Bld: 179 mg/dL — ABNORMAL HIGH (ref 65–99)
Potassium: 3.4 mmol/L — ABNORMAL LOW (ref 3.5–5.1)
Sodium: 138 mmol/L (ref 135–145)
TOTAL PROTEIN: 9 g/dL — AB (ref 6.5–8.1)

## 2016-08-31 LAB — GLUCOSE, CAPILLARY
GLUCOSE-CAPILLARY: 100 mg/dL — AB (ref 65–99)
Glucose-Capillary: 115 mg/dL — ABNORMAL HIGH (ref 65–99)
Glucose-Capillary: 95 mg/dL (ref 65–99)
Glucose-Capillary: 101 mg/dL — ABNORMAL HIGH (ref 65–99)

## 2016-08-31 LAB — LACTIC ACID, PLASMA: Lactic Acid, Venous: 0.8 mmol/L (ref 0.5–1.9)

## 2016-08-31 LAB — PHOSPHORUS: Phosphorus: 2.5 mg/dL (ref 2.5–4.6)

## 2016-08-31 LAB — LIPASE, BLOOD: Lipase: 46 U/L (ref 11–51)

## 2016-08-31 LAB — MAGNESIUM: MAGNESIUM: 1.3 mg/dL — AB (ref 1.7–2.4)

## 2016-08-31 MED ORDER — ENOXAPARIN SODIUM 40 MG/0.4ML ~~LOC~~ SOLN
40.0000 mg | SUBCUTANEOUS | Status: DC
  Administered 2016-08-31 – 2016-09-04 (×5): 40 mg via SUBCUTANEOUS
  Filled 2016-08-31 (×5): qty 0.4

## 2016-08-31 MED ORDER — OXYCODONE-ACETAMINOPHEN 5-325 MG PO TABS
1.0000 | ORAL_TABLET | ORAL | Status: DC | PRN
  Administered 2016-09-01 (×2): 1 via ORAL
  Filled 2016-08-31 (×2): qty 1

## 2016-08-31 MED ORDER — HYDROMORPHONE HCL 1 MG/ML IJ SOLN
1.0000 mg | Freq: Once | INTRAMUSCULAR | Status: AC
  Administered 2016-08-31: 1 mg via INTRAVENOUS
  Filled 2016-08-31: qty 1

## 2016-08-31 MED ORDER — DOCUSATE SODIUM 100 MG PO CAPS
100.0000 mg | ORAL_CAPSULE | Freq: Two times a day (BID) | ORAL | Status: DC
  Administered 2016-08-31 – 2016-09-03 (×8): 100 mg via ORAL
  Filled 2016-08-31 (×8): qty 1

## 2016-08-31 MED ORDER — KETOROLAC TROMETHAMINE 30 MG/ML IJ SOLN
30.0000 mg | Freq: Once | INTRAMUSCULAR | Status: AC
  Administered 2016-08-31: 30 mg via INTRAVENOUS
  Filled 2016-08-31: qty 1

## 2016-08-31 MED ORDER — BUPRENORPHINE HCL-NALOXONE HCL 2-0.5 MG SL SUBL
2.0000 | SUBLINGUAL_TABLET | Freq: Every day | SUBLINGUAL | Status: DC
  Administered 2016-08-31 – 2016-09-04 (×5): 2 via SUBLINGUAL
  Filled 2016-08-31 (×5): qty 2

## 2016-08-31 MED ORDER — ONDANSETRON HCL 4 MG/2ML IJ SOLN
4.0000 mg | Freq: Once | INTRAMUSCULAR | Status: AC
  Administered 2016-08-31: 4 mg via INTRAVENOUS
  Filled 2016-08-31: qty 2

## 2016-08-31 MED ORDER — PROMETHAZINE HCL 25 MG PO TABS
12.5000 mg | ORAL_TABLET | Freq: Four times a day (QID) | ORAL | Status: DC | PRN
  Administered 2016-08-31 – 2016-09-04 (×13): 12.5 mg via ORAL
  Filled 2016-08-31 (×13): qty 1

## 2016-08-31 MED ORDER — FAMOTIDINE IN NACL 20-0.9 MG/50ML-% IV SOLN
20.0000 mg | Freq: Once | INTRAVENOUS | Status: AC
  Administered 2016-08-31: 20 mg via INTRAVENOUS
  Filled 2016-08-31: qty 50

## 2016-08-31 MED ORDER — SODIUM CHLORIDE 0.9 % IV SOLN
INTRAVENOUS | Status: AC
  Administered 2016-08-31: 13:00:00 via INTRAVENOUS

## 2016-08-31 MED ORDER — ACETAMINOPHEN 500 MG PO TABS: 1000.0000 mg | ORAL_TABLET | Freq: Four times a day (QID) | ORAL | Status: DC | PRN

## 2016-08-31 MED ORDER — GABAPENTIN 600 MG PO TABS
900.0000 mg | ORAL_TABLET | Freq: Every day | ORAL | Status: DC
  Filled 2016-08-31 (×4): qty 2

## 2016-08-31 MED ORDER — SODIUM CHLORIDE 0.9 % IV BOLUS (SEPSIS)
2000.0000 mL | Freq: Once | INTRAVENOUS | Status: AC
  Administered 2016-08-31: 2000 mL via INTRAVENOUS

## 2016-08-31 MED ORDER — SENNA 8.6 MG PO TABS
1.0000 | ORAL_TABLET | Freq: Two times a day (BID) | ORAL | Status: DC
  Administered 2016-08-31 – 2016-09-03 (×7): 8.6 mg via ORAL
  Filled 2016-08-31 (×7): qty 1

## 2016-08-31 MED ORDER — MAGNESIUM CITRATE PO SOLN
0.5000 | Freq: Once | ORAL | Status: AC
  Administered 2016-08-31: 0.5 via ORAL
  Filled 2016-08-31: qty 296

## 2016-08-31 MED ORDER — KETOROLAC TROMETHAMINE 15 MG/ML IJ SOLN
15.0000 mg | Freq: Four times a day (QID) | INTRAMUSCULAR | Status: DC | PRN
  Administered 2016-08-31 – 2016-09-02 (×3): 15 mg via INTRAVENOUS
  Filled 2016-08-31 (×4): qty 1

## 2016-08-31 MED ORDER — BUPRENORPHINE HCL 300 MCG BU FILM
1.0000 | ORAL_FILM | Freq: Two times a day (BID) | BUCCAL | Status: DC
Start: 2016-08-31 — End: 2016-08-31

## 2016-08-31 MED ORDER — POTASSIUM CHLORIDE 10 MEQ/100ML IV SOLN
10.0000 meq | INTRAVENOUS | Status: AC
  Administered 2016-08-31 (×2): 10 meq via INTRAVENOUS
  Filled 2016-08-31 (×2): qty 100

## 2016-08-31 MED ORDER — DICYCLOMINE HCL 10 MG/ML IM SOLN
20.0000 mg | Freq: Once | INTRAMUSCULAR | Status: AC
  Administered 2016-08-31: 20 mg via INTRAMUSCULAR
  Filled 2016-08-31: qty 2

## 2016-08-31 MED ORDER — INSULIN ASPART 100 UNIT/ML ~~LOC~~ SOLN
0.0000 [IU] | SUBCUTANEOUS | Status: DC
  Administered 2016-09-01 – 2016-09-03 (×6): 2 [IU] via SUBCUTANEOUS
  Administered 2016-09-03 (×2): 1 [IU] via SUBCUTANEOUS
  Administered 2016-09-04: 2 [IU] via SUBCUTANEOUS
  Administered 2016-09-04: 1 [IU] via SUBCUTANEOUS

## 2016-08-31 MED ORDER — ACETAMINOPHEN 650 MG RE SUPP: 650.0000 mg | Freq: Four times a day (QID) | RECTAL | Status: DC | PRN

## 2016-08-31 NOTE — H&P (Signed)
Date: 08/31/2016               Patient Name:  [name] MRN: [id number]  DOB: May 28, 1955 Age / Sex: 61 y.o., female   PCP: Ophelia Shoulder, MD              Medical Service: Internal Medicine Teaching Service              Attending Physician: Dr. Bartholomew Crews, MD    After Hours (After 5p/  First Contact Pager: [phone]  weekends / holidays): Second Contact Pager: [phone]   Chief Complaint: LUQ abdominal pain  History of Present Illness:  [name] is a 61 YO F with a PMH of pancreas divisum, HTN, CVA, thalassemia, T2DM, uterine cancer s/p hysterectomy, and 15 pack-year smoking history who presents with severe LUQ pain radiating to back. Per pt, the pain came on yesterday morning after she consumed her typical breakfast of cereal, fruit and cinnamon toast. The pain was accompanied by nausea and bloating, but no emesis. Pt has been able to keep liquids down, but hasn't eaten since yesterday morning. She has produced a larger-than-normal amount of normal stool since yesterday morning. She endorses episodic LUQ pain since being diagnosed with pancreatitis in 2014, but only one episode has been this severe; this episode occurred one year ago. The episodes are sometimes provoked by consumption of fatty, rich or spicy foods, but some episodes, including this most recent one, have no identifiable trigger. Pt has received ERCP (2014) as well as two stents (2015 and 2016) for pancreatitis, which she believes led to significant temporary improvement of her symptoms. She manages pain with oxycodone and/or buprenorphine, and reports taking two doses of buprenorphine yesterday. She denies alcohol consumption and illicit drug use, and quit smoking (previous ~5 pack-year history) in 1999. Family history is significant for pancreatic cancer in multiple uncles, stomach cancer in father, renal cancer in maternal grandmother, and lung cancer and multiple other unspecified cancers on maternal  side. Per pt, she is managing her diabetes and HTN with diet; A1C was 8.6 on 05/05/16, and BP was 112/62 today. Last colonoscopy was 13 years ago.   Meds: Current Facility-Administered Medications  Medication Dose Route Frequency Provider Last Rate Last Dose  . 0.9 %  sodium chloride infusion   Intravenous Continuous Milagros Loll, MD 75 mL/hr at 08/31/16 1245    . acetaminophen (TYLENOL) tablet 1,000 mg  1,000 mg Oral Q6H PRN Milagros Loll, MD       Or  . acetaminophen (TYLENOL) suppository 650 mg  650 mg Rectal Q6H PRN Milagros Loll, MD      . Buprenorphine HCl FILM 1 each  1 each Buccal Q12H Milagros Loll, MD      . docusate sodium (COLACE) capsule 100 mg  100 mg Oral BID Milagros Loll, MD   100 mg at 08/31/16 1257  . enoxaparin (LOVENOX) injection 40 mg  40 mg Subcutaneous Q24H Milagros Loll, MD   40 mg at 08/31/16 1453  . gabapentin (NEURONTIN) tablet 900 mg  900 mg Oral QHS Milagros Loll, MD      . insulin aspart (novoLOG) injection 0-9 Units  0-9 Units Subcutaneous Q4H Milagros Loll, MD      . ketorolac (TORADOL) 15 MG/ML injection 15 mg  15 mg Intravenous Q6H PRN Milagros Loll, MD   15 mg at 08/31/16 1250  . promethazine (PHENERGAN) tablet 12.5 mg  12.5 mg Oral Q6H PRN  Milagros Loll, MD   12.5 mg at 08/31/16 1305  . senna (SENOKOT) tablet 8.6 mg  1 tablet Oral BID Milagros Loll, MD   8.6 mg at 08/31/16 1257    Allergies: Allergies as of 08/30/2016 - Review Complete 07/18/2016  Allergen Reaction Noted  . Butalbital-aspirin-caffeine Anaphylaxis 12/01/2014  . Fioricet [butalbital-apap-caffeine] Other (See Comments)   . Shellfish-derived products Anaphylaxis 12/01/2014  . Shrimp flavor Anaphylaxis 08/25/2010  . Sulfonamide derivatives Rash 07/13/2006  . Aspirin  11/20/2012  . Sulfamethoxazole Rash 12/01/2014   Past Medical History:  Diagnosis Date  . Asthma    childhood asthma  . Beta thalassemia ([place])   . Blood dyscrasia    thalasemia, sickle  cell trait  . Chronic abdominal pain    Unclear etiology, thought to be due to chronic pancreatitis previously in setting of her pancreatic divisum - however, EUS and EGD performed at [place]  (10/05/2011) showing normal esophageall, gastric, duodenal mucosa. EUS showing no pancreatic masses, cysts, or changes of chronic pancreatitis. Biliary system nondilated and had no endosonographic abnormalities.  . CVA (cerebral infarction) 1993   Per report by patient she had a stroke and prolonged rehab course eventually resolving her right sided weakness, as per her hx obtained 08/2007  . HLD (hyperlipidemia) 2009  . Hypertension   . Migraine headache    "q other year now" (11/20/2012)  . Pancreatic divisum    S/P ERCP with stenting 07/2010 at [facility], then stent removal (08/05/2010)  . Pneumonia ~ 1969  . Pulmonary embolus ([place]) 08/2004   Two areas of V/Q mismatch. Findings compatible  with high probability for pulmonary embolus.; pt was on coumadin for 1 year  . Seizures ([place])    "after the stroke in 1993; none for years now" 11/20/2012)  . Sickle cell trait ([place])   . Stroke [facility]) 1993   very small amount of left sided weakness  . Thalassemia   . Type II diabetes mellitus ([place]) 2010   well controlled  . Uterine cancer [facility])    Past Surgical History:  Procedure Laterality Date  . ABDOMINAL HYSTERECTOMY  1980   2/2 endometriosis  . APPENDECTOMY  ?1980   "I think I've had it out" (11/20/2012)  . BREAST LUMPECTOMY Bilateral 1980's   "in my milk ducts; both benign" (02/27/2012)  . CORONARY ANGIOGRAM Bilateral 03/17/2011   Procedure: CORONARY ANGIOGRAM;  Surgeon: Jettie Booze, MD;  Location: [facility] CATH LAB;  Service: Cardiovascular;  Laterality: Bilateral;  . ESOPHAGOGASTRODUODENOSCOPY N/A 10/26/2013   Procedure: ESOPHAGOGASTRODUODENOSCOPY (EGD);  Surgeon: Jerene Bears, MD;  Location: [facility] ENDOSCOPY;  Service: Endoscopy;  Laterality: N/A;  . Pancreatic stent placement/removal     placed in  2011; removed in 2012/H&P (02/27/2012)  . SPHINCTEROTOMY     Archie Endo 11/20/2012   Family History  Problem Relation Age of Onset  . Prostate cancer Father   . Cancer Father     stomach ca died x 1 year ago  . Sickle cell anemia Brother   . Lung cancer Maternal Aunt   . Lung cancer Maternal Uncle   . Breast cancer Maternal Grandmother   . Pancreatic cancer Maternal Grandmother   . Heart disease Maternal Grandfather   . Heart disease Paternal Grandfather   . Diabetes Other   . Cancer Other     multiple cancers pancreas,colon, breast   Social History   Social History  . Marital status: Married    Spouse name: N/A  . Number of children: N/A  .  Years of education: 51   Occupational History  .  Unemployed   Social History Main Topics  . Smoking status: Former Smoker    Packs/day: 0.50    Years: 30.00    Types: Cigarettes    Quit date: 01/01/1999  . Smokeless tobacco: Never Used  . Alcohol use No  . Drug use: No  . Sexual activity: Not on file   Other Topics Concern  . Not on file   Social History Narrative   Worked at [employer] as a Librarian, academic, on Retail banker about 9 hours/ day    Review of Systems: Pertinent items noted in HPI and remainder of comprehensive ROS otherwise negative.  Physical Exam: Blood pressure 118/70, pulse 63, temperature 98.1 F (36.7 C), temperature source Oral, resp. rate 16, height 5' (1.524 m), weight 51.8 kg (114 lb 1.6 oz), SpO2 98 %. BP 118/70 (BP Location: Left Arm)   Pulse 63   Temp 98.1 F (36.7 C) (Oral)   Resp 16   Ht 5' (1.524 m)   Wt 51.8 kg (114 lb 1.6 oz)   SpO2 98%   BMI 22.28 kg/m   General Appearance:    Alert, cooperative, appears stated age, in significant pain.  Head:    Normocephalic, without obvious abnormality, atraumatic  Eyes:    PERRL, conjunctiva/corneas clear, EOM's intact, fundi    benign, both eyes  Throat:   Lips, mucosa, and tongue normal; teeth and gums normal  Neck:   Supple, symmetrical, trachea  midline, no adenopathy;    thyroid:  no enlargement/tenderness/nodules.  Back:     Exam limited by pain  Lungs:     Clear to auscultation bilaterally, respirations unlabored  Chest Wall:    No tenderness or deformity   Heart:    Regular rate and rhythm, S1 and S2 normal, no murmur, rub   or gallop  Abdomen:     Exam limited by pain.  Extremities:   Extremities normal, atraumatic, no cyanosis or edema  Pulses:   2+ and symmetric all extremities  Skin:   Skin color, texture, turgor normal, no rashes or lesions  Neurologic:   CNII-XII intact, normal strength, sensation and reflexes    throughout   Lab results:  CBC 08/31/2016 10/27/2013  WBC 9.0 8.4  Hemoglobin 12.5 11.0(L)  Hematocrit 36.5 32.1(L)  Platelets 414(H) 335   BMP Latest Ref Rng & Units 08/31/2016 10/28/2013  Glucose 65 - 99 mg/dL 179(H) 144(H)  BUN 6 - 20 mg/dL 11 3(L)  Creatinine 0.44 - 1.00 mg/dL 0.78 0.53  Sodium 135 - 145 mmol/L 138 143  Potassium 3.5 - 5.1 mmol/L 3.4(L) 3.7  Chloride 101 - 111 mmol/L 101 105  CO2 22 - 32 mmol/L 28 22  Calcium 8.9 - 10.3 mg/dL 10.1 9.0   Imaging results:  Dg Abd Acute W/chest  Result Date: 08/31/2016 CLINICAL DATA:  Abdominal pain EXAM: DG ABDOMEN ACUTE W/ 1V CHEST COMPARISON:  10/27/2013 FINDINGS: Moderate stool burden throughout the colon. The bowel gas pattern is normal. There is no evidence of free intraperitoneal air. No suspicious radio-opaque calculi or other significant radiographic abnormality is seen. Heart size and mediastinal contours are within normal limits. Both lungs are clear. IMPRESSION: Moderate stool burden. No evidence of bowel obstruction or free air. No acute cardiopulmonary disease. Electronically Signed   By: Rolm Baptise M.D.   On: 08/31/2016 11:04   Assessment & Plan by Problem: Ms. Mcintire is a 61 YO F with a PMH of pancreas  divisum, HTN, CVA, thalassemia, T2DM, uterine cancer s/p hysterectomy, and 15 pack-year smoking history who presents with severe LUQ  pain radiating to back.   Acute on chronic abdominal pain: She endorses episodic LUQ pain since being diagnosed with pancreatitis in 2014, but only one episode has been this severe; this episode occurred one year ago. The episodes are sometimes provoked by consumption of fatty, rich or spicy foods, but some episodes, including this most recent one, have no identifiable trigger. Pt has recently been managing pain with buprenorphine. Pt presented afebrile and with no leukocytosis, which suggests a noninfectious process. Normal lipase, alk phosph and bili suggest a non-biliary process. She has been able to keep down liquids but has not eaten anything since yesterday and should remain NPO. Pt's personal and family history are concerning for neoplasm.  -NPO -NS 75 mL/hr, 12 hrs -Continue home buprenorphine, + toradol 15 mg PRN -Docusate to encourage BM -If no improvement, consider CT angio to rule out ischemic bowel, and/or CTabd/pelvis to rule out neoplasm.   T2DM: A1c 8.6 05/05/16, cap glucose 115 on admission.  -Hold home metformin -CBG q4h, SSI  Hypokalemia: K+ 3.4 on admission. -Replete w/ IV KCl  HTN: BP was 112/62 today.  -Continue home prinzide  FENGI: NPO  DVT prophylaxis: Lovenox  Code: full  Dispo: admit to observation.    This is a Careers information officer Note.  The care of the patient was discussed with Dr. Holley Raring and the assessment and plan was formulated with their assistance.  Please see their note for official documentation of the patient encounter.   Signed: Jeri Cos, Medical Student 08/31/2016, 4:12 PM

## 2016-08-31 NOTE — ED Triage Notes (Signed)
Pt complains of severe abd pain since earlier today Pt has been dx with pancreatitis and believes this is another episode Denies vomiting, just very nauseated

## 2016-08-31 NOTE — Progress Notes (Addendum)
I was paged by PA Antonietta Breach at 10 AM regarding her admission and confirmed that she is hemodynamically stable. I spoke with bed control who confirmed availability of Med/Surg beds. Only one patient precedes her on the list, so I anticipate she will get transferred here sooner versus later.

## 2016-08-31 NOTE — ED Provider Notes (Signed)
[facility] DEPT Provider Note   CSN: [id number] Arrival date & time: 08/30/16  2233     History   Chief Complaint Chief Complaint  Patient presents with  . Abdominal Pain    HPI [name] is a 61 y.o. female.  61 year old female with a history of hypertension, dyslipidemia, pulmonary embolus, pancreatic divisum s/p ERCP with stenting, diabetes mellitus, and chronic abdominal pain presents to the emergency department for abdominal pain which began 24 hours ago. Patient reports constant, waxing and waning pain which is worsening overall. Anus located in her epigastrium. She notes it radiating around to her back. She believes that her symptoms are due to pancreatitis. She has had nausea as well as multiple episodes of emesis. She reports passing plenty of flatus. Normal BM yesterday. No fevers. Patient has taken her home pain medication without relief. She is followed by pain management. She states that she is followed by a digestive specialist.   The history is provided by the patient. No language interpreter was used.  Abdominal Pain      Past Medical History:  Diagnosis Date  . Asthma    childhood asthma  . Beta thalassemia ([place])   . Blood dyscrasia    thalasemia, sickle cell trait  . Chronic abdominal pain    Unclear etiology, thought to be due to chronic pancreatitis previously in setting of her pancreatic divisum - however, EUS and EGD performed at [facility]  (10/05/2011) showing normal esophageall, gastric, duodenal mucosa. EUS showing no pancreatic masses, cysts, or changes of chronic pancreatitis. Biliary system nondilated and had no endosonographic abnormalities.  . CVA (cerebral infarction) 1993   Per report by patient she had a stroke and prolonged rehab course eventually resolving her right sided weakness, as per her hx obtained 08/2007  . HLD (hyperlipidemia) 2009  . Hypertension   . Migraine headache    "q other year now" (11/20/2012)  . Pancreatic  divisum    S/P ERCP with stenting 07/2010 at [facility], then stent removal (08/05/2010)  . Pneumonia ~ 1969  . Pulmonary embolus ([place]) 08/2004   Two areas of V/Q mismatch. Findings compatible  with high probability for pulmonary embolus.; pt was on coumadin for 1 year  . Seizures ([place])    "after the stroke in 1993; none for years now" 11/20/2012)  . Sickle cell trait ([place])   . Stroke [facility]) 1993   very small amount of left sided weakness  . Thalassemia   . Type II diabetes mellitus ([place]) 2010   well controlled  . Uterine cancer [facility])     Patient Active Problem List   Diagnosis Date Noted  . Palpitation 07/18/2016  . Headache 07/18/2016  . Colon cancer screening 05/23/2016  . Opioid dependence ([place]) 04/28/2016  . Menopausal hot flushes 06/17/2014  . Patellar subluxation 01/28/2013  . Sickle cell trait ([place]) 11/16/2011  . Hx of migraine headaches 11/16/2011  . Seizure disorder (history after stroke last sz late 1990s) 11/16/2011  . Beta thalassemia ([place]) 11/16/2011  . History of asthma 11/16/2011  . Iron deficiency anemia 11/16/2011  . Chronic abdominal pain   . Preventative health care 07/20/2010  . GERD 08/03/2007  . Congenital anomaly of pancreas 02/28/2006  . HYPERLIPIDEMIA 02/06/2006  . Essential hypertension 02/06/2006  . Diabetes type 2, controlled ([place]) 02/07/1992    Past Surgical History:  Procedure Laterality Date  . ABDOMINAL HYSTERECTOMY  1980   2/2 endometriosis  . APPENDECTOMY  ?1980   "I think I've had it out" (  11/20/2012)  . BREAST LUMPECTOMY Bilateral 1980's   "in my milk ducts; both benign" (02/27/2012)  . CORONARY ANGIOGRAM Bilateral 03/17/2011   Procedure: CORONARY ANGIOGRAM;  Surgeon: Jettie Booze, MD;  Location: [facility] CATH LAB;  Service: Cardiovascular;  Laterality: Bilateral;  . ESOPHAGOGASTRODUODENOSCOPY N/A 10/26/2013   Procedure: ESOPHAGOGASTRODUODENOSCOPY (EGD);  Surgeon: Jerene Bears, MD;  Location: [facility] ENDOSCOPY;  Service: Endoscopy;   Laterality: N/A;  . Pancreatic stent placement/removal     placed in 2011; removed in 2012/H&P (02/27/2012)  . SPHINCTEROTOMY     Archie Endo 11/20/2012    OB History    No data available       Home Medications    Prior to Admission medications   Medication Sig Start Date End Date Taking? Authorizing Provider  Buprenorphine HCl 300 MCG FILM Place 1 each inside cheek every 12 (twelve) hours. 08/16/16 09/15/16 Yes Historical Provider, MD  calcium carbonate (TUMS - DOSED IN MG ELEMENTAL CALCIUM) 500 MG chewable tablet Chew 1 tablet by mouth daily as needed for indigestion or heartburn.   Yes Historical Provider, MD  lisinopril-hydrochlorothiazide (PRINZIDE,ZESTORETIC) 20-12.5 MG tablet Take 2 tablets by mouth daily. 07/18/16  Yes Alphonzo Grieve, MD  metFORMIN (GLUCOPHAGE) 1000 MG tablet TAKE 1 TABLET BY MOUTH TWICE DAILY WITH MEALS 05/04/16  Yes Ophelia Shoulder, MD  capsicum (ZOSTRIX) 0.075 % topical cream Apply 1 application topically 4 (four) times daily. As needed for pain Patient not taking: Reported on 08/31/2016 12/01/14   Collier Salina, MD  CREON 12000 UNITS CPEP capsule TAKE 1 CAPSULE BY MOUTH 3 TIMES DAILY BEFORE MEALS. Patient not taking: Reported on 08/31/2016 04/01/14   Ejiroghene E Emokpae, MD  [employer] SENNA PLUS 8.6-50 MG per tablet TAKE 2 TABLETS BY MOUTH AT BEDTIME. Patient not taking: Reported on 08/31/2016 08/01/14   Ejiroghene Arlyce Dice, MD  ibuprofen (ADVIL,MOTRIN) 800 MG tablet Take 1 tablet (800 mg total) by mouth every 8 (eight) hours as needed. Patient not taking: Reported on 08/31/2016 01/16/14   Ejiroghene E Emokpae, MD  pantoprazole (PROTONIX) 20 MG tablet TAKE 1 TABLET BY MOUTH EVERY DAY Patient not taking: Reported on 08/31/2016 12/24/13   Ejiroghene Arlyce Dice, MD  pravastatin (PRAVACHOL) 40 MG tablet TAKE 1 TABLET BY MOUTH EVERY DAY Patient not taking: Reported on 08/31/2016 07/23/14   Ejiroghene Arlyce Dice, MD  promethazine (PHENERGAN) 12.5 MG tablet Take 1 tablet (12.5 mg total) by mouth  every 8 (eight) hours as needed for nausea or vomiting. Patient not taking: Reported on 08/31/2016 07/18/16   Alphonzo Grieve, MD    Family History Family History  Problem Relation Age of Onset  . Prostate cancer Father   . Cancer Father     stomach ca died x 1 year ago  . Sickle cell anemia Brother   . Lung cancer Maternal Aunt   . Lung cancer Maternal Uncle   . Breast cancer Maternal Grandmother   . Heart disease Maternal Grandfather   . Heart disease Paternal Grandfather   . Diabetes Other   . Cancer Other     multiple cancers pancreas,colon, breast    Social History Social History  Substance Use Topics  . Smoking status: Former Smoker    Packs/day: 0.50    Years: 30.00    Types: Cigarettes    Quit date: 01/01/1999  . Smokeless tobacco: Never Used  . Alcohol use No     Allergies   Butalbital-aspirin-caffeine; Fioricet [butalbital-apap-caffeine]; Shellfish-derived products; Shrimp flavor; Sulfonamide derivatives; Aspirin; and Sulfamethoxazole   Review  of Systems Review of Systems  Gastrointestinal: Positive for abdominal pain.  Ten systems reviewed and are negative for acute change, except as noted in the HPI.    Physical Exam Updated Vital Signs BP 126/83   Pulse 75   Temp 98 F (36.7 C) (Oral)   Resp 18   Ht 5' (1.524 m)   Wt 51.2 kg   SpO2 99%   BMI 22.05 kg/m   Physical Exam  Constitutional: She is oriented to person, place, and time. She appears well-developed and well-nourished. No distress.  Whimpering, c/o pain. Nontoxic appearing.  HENT:  Head: Normocephalic and atraumatic.  Eyes: Conjunctivae and EOM are normal. No scleral icterus.  Neck: Normal range of motion.  Cardiovascular: Normal rate, regular rhythm and intact distal pulses.   Pulmonary/Chest: Effort normal. No respiratory distress. She has no wheezes. She has no rales.  Respirations even and unlabored. Lungs CTAB.  Abdominal: Soft. She exhibits no mass. There is tenderness. There is no  guarding.  Soft mildly distended abdomen with tenderness to palpation in the epigastrium and left upper quadrant. No masses or peritoneal signs.  Musculoskeletal: Normal range of motion.  Neurological: She is alert and oriented to person, place, and time. She exhibits normal muscle tone. Coordination normal.  GCS 15. Patient moving all extremities. She is ambulatory with steady gait.  Skin: Skin is warm and dry. No rash noted. She is not diaphoretic. No erythema. No pallor.  Psychiatric: She has a normal mood and affect. Her behavior is normal.  Nursing note and vitals reviewed.    ED Treatments / Results  Labs (all labs ordered are listed, but only abnormal results are displayed) Labs Reviewed  COMPREHENSIVE METABOLIC PANEL - Abnormal; Notable for the following:       Result Value   Potassium 3.4 (*)    Glucose, Bld 179 (*)    Total Protein 9.0 (*)    ALT 12 (*)    Total Bilirubin 0.2 (*)    All other components within normal limits  CBC - Abnormal; Notable for the following:    MCV 77.5 (*)    Platelets 414 (*)    All other components within normal limits  URINALYSIS, ROUTINE W REFLEX MICROSCOPIC - Abnormal; Notable for the following:    Glucose, UA 150 (*)    Leukocytes, UA SMALL (*)    All other components within normal limits  LIPASE, BLOOD    EKG  EKG Interpretation None       Radiology No results found.  Procedures Procedures (including critical care time)  Medications Ordered in ED Medications  HYDROmorphone (DILAUDID) injection 1 mg (not administered)  ketorolac (TORADOL) 30 MG/ML injection 30 mg (not administered)  sodium chloride 0.9 % bolus 2,000 mL (2,000 mLs Intravenous New Bag/Given 08/31/16 0515)  famotidine (PEPCID) IVPB 20 mg premix (20 mg Intravenous New Bag/Given 08/31/16 0517)  dicyclomine (BENTYL) injection 20 mg (20 mg Intramuscular Given 08/31/16 0516)  ondansetron (ZOFRAN) injection 4 mg (4 mg Intravenous Given 08/31/16 0517)  HYDROmorphone  (DILAUDID) injection 1 mg (1 mg Intravenous Given 08/31/16 0516)     Initial Impression / Assessment and Plan / ED Course  I have reviewed the triage vital signs and the nursing notes.  Pertinent labs & imaging results that were available during my care of the patient were reviewed by me and considered in my medical decision making (see chart for details).     61 year old female with history of chronic abdominal pain suspected secondary to  pancreatitis, presents to the emergency department for worsening abdominal pain 24 hours. She reports that symptoms feel similar to past episodes of pancreatitis. Patient with reassuring laboratory workup. No evidence of acute surgical abdomen on exam.  Pain control attempted with multiple medications. Patient continues to complain of pain. She does not feel comfortable managing her symptoms further on an outpatient basis. Case discussed with internal medicine teaching service who will admit the patient for further pain control. EMTALA completed for transfer.   Final Clinical Impressions(s) / ED Diagnoses   Final diagnoses:  Epigastric pain    New Prescriptions New Prescriptions   No medications on file     Antonietta Breach, PA-C 08/31/16 [id number]    Shanon Rosser, MD 08/31/16 [phone]

## 2016-08-31 NOTE — H&P (Signed)
Date: 08/31/2016               Patient Name:  [name] MRN: [id number]  DOB: 12-Feb-1956 Age / Sex: 61 y.o., female   PCP: Ophelia Shoulder, MD         Medical Service: Internal Medicine Teaching Service         Attending Physician: Dr. Bartholomew Crews, MD    First Contact: Dr. Gay Filler Pager: [phone]  Second Contact: Dr. Marlowe Sax Pager: [phone]       After Hours (After 5p/  First Contact Pager: [phone]  weekends / holidays): Second Contact Pager: [phone]   Chief Complaint: abdominal pain  History of Present Illness: 61 year old woman with history of pancreatic divisum s/p ERCP with stent placed in 07/2010 and removed 08/2010, chronic abdominal pain, CVA, HLD, HTN, migraine headache, beta thalassemia, PE s/p coumadin x 1 year, seizures, T2DM, uterine cancer presenting with abdominal pain. Abdominal pain that radiates to her back. Started yesterday. Has been worsening. Cramping in nature. Intermittent. Exacerbated by PO intake. No relieving factors. Abdomen feels bloated. She has nausea but no vomiting. No diarrhea. Last BM was last night. Firm and normal caliber. Does not remember any hematochezia or melena. Her BM are every 1-2 days. Similar to previous episode of pancreatitis - over a year ago. She still has her gallbladder. She has had a hysterectomy and appendectomy. She is no longer on Creon - last saw her GI MD in March. She does not know of any triggers for this episode.   No fevers or chills, vision changes, cough, dyspnea, chest pain, dysuria.   Meds:  Current Meds  Medication Sig  . Buprenorphine HCl 300 MCG FILM Place 1 each inside cheek every 12 (twelve) hours.  . calcium carbonate (TUMS - DOSED IN MG ELEMENTAL CALCIUM) 500 MG chewable tablet Chew 1 tablet by mouth daily as needed for indigestion or heartburn.  Marland Kitchen lisinopril-hydrochlorothiazide (PRINZIDE,ZESTORETIC) 20-12.5 MG tablet Take 2 tablets by mouth daily.  . metFORMIN (GLUCOPHAGE) 1000 MG tablet TAKE 1  TABLET BY MOUTH TWICE DAILY WITH MEALS     Allergies: Allergies as of 08/30/2016 - Review Complete 07/18/2016  Allergen Reaction Noted  . Butalbital-aspirin-caffeine Anaphylaxis 12/01/2014  . Fioricet [butalbital-apap-caffeine] Other (See Comments)   . Shellfish-derived products Anaphylaxis 12/01/2014  . Shrimp flavor Anaphylaxis 08/25/2010  . Sulfonamide derivatives Rash 07/13/2006  . Aspirin  11/20/2012  . Sulfamethoxazole Rash 12/01/2014   Past Medical History:  Diagnosis Date  . Asthma    childhood asthma  . Beta thalassemia ([place])   . Blood dyscrasia    thalasemia, sickle cell trait  . Chronic abdominal pain    Unclear etiology, thought to be due to chronic pancreatitis previously in setting of her pancreatic divisum - however, EUS and EGD performed at [place]  (10/05/2011) showing normal esophageall, gastric, duodenal mucosa. EUS showing no pancreatic masses, cysts, or changes of chronic pancreatitis. Biliary system nondilated and had no endosonographic abnormalities.  . CVA (cerebral infarction) 1993   Per report by patient she had a stroke and prolonged rehab course eventually resolving her right sided weakness, as per her hx obtained 08/2007  . HLD (hyperlipidemia) 2009  . Hypertension   . Migraine headache    "q other year now" (11/20/2012)  . Pancreatic divisum    S/P ERCP with stenting 07/2010 at [facility], then stent removal (08/05/2010)  . Pneumonia ~ 1969  . Pulmonary embolus ([place]) 08/2004   Two areas of V/Q mismatch. Findings  compatible  with high probability for pulmonary embolus.; pt was on coumadin for 1 year  . Seizures ([place])    "after the stroke in 1993; none for years now" 11/20/2012)  . Sickle cell trait ([place])   . Stroke [facility]) 1993   very small amount of left sided weakness  . Thalassemia   . Type II diabetes mellitus ([place]) 2010   well controlled  . Uterine cancer ([place])     Family History: Mother has diabetes and Parkinsons. She is still alive. Father  had stomach cancer. He is deceased.  Social History: Quit tobacco in 1999. 0.5 PPD for 15 years. Use to drink alcohol in the 1990s. No alcohol now. No drugs  Review of Systems: A complete ROS was negative except as per HPI.   Physical Exam: Blood pressure 119/64, pulse 68, temperature 98 F (36.7 C), temperature source Oral, resp. rate 18, height 5' (1.524 m), weight 112 lb 14.4 oz (51.2 kg), SpO2 95 %. General Apperance: NAD Head: Normocephalic, atraumatic Eyes: PERRL, EOMI, anicteric sclera Ears: Normal external ear canal Nose: Nares normal, septum midline, mucosa normal Throat: Munoz, mucosa and tongue normal  Neck: Supple, trachea midline Back: No tenderness or bony abnormality  Lungs: Clear to auscultation bilaterally. No wheezes, rhonchi or rales. Breathing comfortably Chest Wall: Nontender, no deformity Heart: Regular rate and rhythm, no murmur/rub/gallop Abdomen: Soft, tender to palpation most in LUQ, nondistended, no rebound, mild guarding Extremities: Normal, atraumatic, warm and well perfused, no edema Pulses: 2+ throughout Skin: No rashes or lesions Neurologic: Alert and oriented x 3. CNII-XII intact. Normal strength and sensation  Labs  CMP     Component Value Date/Time   NA 138 08/31/2016 0202   K 3.4 (L) 08/31/2016 0202   CL 101 08/31/2016 0202   CO2 28 08/31/2016 0202   GLUCOSE 179 (H) 08/31/2016 0202   BUN 11 08/31/2016 0202   CREATININE 0.78 08/31/2016 0202   CREATININE 0.72 07/31/2013 1644   CALCIUM 10.1 08/31/2016 0202   PROT 9.0 (H) 08/31/2016 0202   ALBUMIN 4.5 08/31/2016 0202   AST 19 08/31/2016 0202   ALT 12 (L) 08/31/2016 0202   ALKPHOS 64 08/31/2016 0202   BILITOT 0.2 (L) 08/31/2016 0202   GFRNONAA >60 08/31/2016 0202   GFRNONAA >89 07/31/2013 1644   GFRAA >60 08/31/2016 0202   GFRAA >89 07/31/2013 1644   CBC    Component Value Date/Time   WBC 9.0 08/31/2016 0202   RBC 4.71 08/31/2016 0202   HGB 12.5 08/31/2016 0202   HGB 12.7  10/13/2006 1445   HCT 36.5 08/31/2016 0202   HCT 36.8 10/13/2006 1445   PLT 414 (H) 08/31/2016 0202   PLT 365 10/13/2006 1445   MCV 77.5 (L) 08/31/2016 0202   MCV 81.3 10/13/2006 1445   MCH 26.5 08/31/2016 0202   MCHC 34.2 08/31/2016 0202   RDW 14.4 08/31/2016 0202   RDW 14.5 10/13/2006 1445   LYMPHSABS 3.7 10/23/2013 1458   LYMPHSABS 3.5 (H) 10/13/2006 1445   MONOABS 0.6 10/23/2013 1458   MONOABS 0.5 10/13/2006 1445   EOSABS 0.1 10/23/2013 1458   EOSABS 0.1 10/13/2006 1445   BASOSABS 0.0 10/23/2013 1458   BASOSABS 0.3 (H) 10/13/2006 1445     Assessment & Plan by Problem: 61 year old woman with history of pancreatic divisum s/p ERCP with papillotomy, stent placed in 07/2010 and removed 08/2010, chronic abdominal pain, CVA, HLD, HTN, migraine headache, beta thalassemia, PE s/p coumadin x 1 year, seizures, T2DM, uterine cancer presenting with  abdominal pain.  Acute on Chronic Abdominal Pain: history of pancreatic divisum s/p ERCP with stent placed in 07/2010 and removed 08/2010. She was last seen by GI on 4/2 who recommended to continue to hold Creon and continue dietary modifications. Afebrile and hemodynamically stable. No leukocytosis. LFTs without signficant abnormalities. Lipase normal. Denies urinary symptoms and has no bacteria on UA, small leukocytes, negiative nitrite, and 0-5 WBC. Abd XR has moderate stool burden both otherwise no acute abnormalities. -NPO, NS@75ml /hr for 12 hr -If no improvement, consider CT abd/pelvis -Continue home buprenorphine BID and gabapentin -Toradol 15mg  q6hr prn pain -Phenergan prn nausea -Docusate and senna BID  Hypokalemia: Mild hypoK at 3.4 on admission. Repleted with IV KCl.  T2DM: Last A1c 8.6 in January. -CBG q4hr and SSI -Held home metformin  FEN: NPO VTE ppx: Lovenox Code: Full   Dispo: Admit patient to Observation with expected length of stay less than 2 midnights.  Signed: Milagros Loll, MD 08/31/2016, 9:01 AM  Pager:  [phone]

## 2016-09-01 ENCOUNTER — Encounter (HOSPITAL_COMMUNITY): Payer: Self-pay

## 2016-09-01 DIAGNOSIS — Z820 Family history of epilepsy and other diseases of the nervous system: Secondary | ICD-10-CM

## 2016-09-01 DIAGNOSIS — Z86711 Personal history of pulmonary embolism: Secondary | ICD-10-CM

## 2016-09-01 DIAGNOSIS — E119 Type 2 diabetes mellitus without complications: Secondary | ICD-10-CM | POA: Diagnosis not present

## 2016-09-01 DIAGNOSIS — R1012 Left upper quadrant pain: Secondary | ICD-10-CM

## 2016-09-01 DIAGNOSIS — D561 Beta thalassemia: Secondary | ICD-10-CM | POA: Diagnosis not present

## 2016-09-01 DIAGNOSIS — Z7984 Long term (current) use of oral hypoglycemic drugs: Secondary | ICD-10-CM

## 2016-09-01 DIAGNOSIS — Z8673 Personal history of transient ischemic attack (TIA), and cerebral infarction without residual deficits: Secondary | ICD-10-CM

## 2016-09-01 DIAGNOSIS — Z8719 Personal history of other diseases of the digestive system: Secondary | ICD-10-CM

## 2016-09-01 DIAGNOSIS — I10 Essential (primary) hypertension: Secondary | ICD-10-CM | POA: Diagnosis not present

## 2016-09-01 DIAGNOSIS — E785 Hyperlipidemia, unspecified: Secondary | ICD-10-CM

## 2016-09-01 DIAGNOSIS — G8929 Other chronic pain: Secondary | ICD-10-CM

## 2016-09-01 DIAGNOSIS — K9049 Malabsorption due to intolerance, not elsewhere classified: Secondary | ICD-10-CM

## 2016-09-01 DIAGNOSIS — Z8542 Personal history of malignant neoplasm of other parts of uterus: Secondary | ICD-10-CM

## 2016-09-01 DIAGNOSIS — Z87891 Personal history of nicotine dependence: Secondary | ICD-10-CM

## 2016-09-01 DIAGNOSIS — Z833 Family history of diabetes mellitus: Secondary | ICD-10-CM

## 2016-09-01 LAB — COMPREHENSIVE METABOLIC PANEL
ALT: 10 U/L — AB (ref 14–54)
AST: 18 U/L (ref 15–41)
Albumin: 2.8 g/dL — ABNORMAL LOW (ref 3.5–5.0)
Alkaline Phosphatase: 41 U/L (ref 38–126)
Anion gap: 7 (ref 5–15)
BUN: 6 mg/dL (ref 6–20)
CHLORIDE: 111 mmol/L (ref 101–111)
CO2: 25 mmol/L (ref 22–32)
CREATININE: 0.72 mg/dL (ref 0.44–1.00)
Calcium: 8.2 mg/dL — ABNORMAL LOW (ref 8.9–10.3)
GFR calc Af Amer: >60 mL/min (ref >60)
GFR calc non Af Amer: >60 mL/min (ref >60)
Glucose, Bld: 90 mg/dL (ref 65–99)
Potassium: 4 mmol/L (ref 3.5–5.1)
Sodium: 143 mmol/L (ref 135–145)
Total Bilirubin: 0.6 mg/dL (ref 0.3–1.2)
Total Protein: 5.1 g/dL — ABNORMAL LOW (ref 6.5–8.1)

## 2016-09-01 LAB — GLUCOSE, CAPILLARY
GLUCOSE-CAPILLARY: 183 mg/dL — AB (ref 65–99)
GLUCOSE-CAPILLARY: 94 mg/dL (ref 65–99)
Glucose-Capillary: 106 mg/dL — ABNORMAL HIGH (ref 65–99)
Glucose-Capillary: 164 mg/dL — ABNORMAL HIGH (ref 65–99)
Glucose-Capillary: 78 mg/dL (ref 65–99)
Glucose-Capillary: 79 mg/dL (ref 65–99)
Glucose-Capillary: 86 mg/dL (ref 65–99)
Glucose-Capillary: 94 mg/dL (ref 65–99)

## 2016-09-01 LAB — CBC
HEMATOCRIT: 29.2 % — AB (ref 36.0–46.0)
Hemoglobin: 9.5 g/dL — ABNORMAL LOW (ref 12.0–15.0)
MCH: 25.2 pg — ABNORMAL LOW (ref 26.0–34.0)
MCHC: 32.5 g/dL (ref 30.0–36.0)
MCV: 77.5 fL — AB (ref 78.0–100.0)
PLATELETS: 328 10*3/uL (ref 150–400)
RBC: 3.77 MIL/uL — ABNORMAL LOW (ref 3.87–5.11)
RDW: 14.6 % (ref 11.5–15.5)
WBC: 6.5 10*3/uL (ref 4.0–10.5)

## 2016-09-01 LAB — VITAMIN D 25 HYDROXY (VIT D DEFICIENCY, FRACTURES): Vit D, 25-Hydroxy: 9.7 ng/mL — ABNORMAL LOW (ref 30.0–100.0)

## 2016-09-01 LAB — HIV ANTIBODY (ROUTINE TESTING W REFLEX): HIV Screen 4th Generation wRfx: NONREACTIVE

## 2016-09-01 MED ORDER — OXYCODONE-ACETAMINOPHEN 5-325 MG PO TABS
1.0000 | ORAL_TABLET | Freq: Four times a day (QID) | ORAL | Status: DC | PRN
  Administered 2016-09-01 – 2016-09-02 (×3): 1 via ORAL
  Filled 2016-09-01 (×3): qty 1

## 2016-09-01 MED ORDER — SODIUM CHLORIDE 0.9 % IV SOLN
INTRAVENOUS | Status: AC
  Administered 2016-09-01 (×2): via INTRAVENOUS

## 2016-09-01 NOTE — Progress Notes (Signed)
Subjective: Currently, the patient is feeling better today, still with LUQ pain. Hungry and requesting a diet. Agreeable with plan for bowel regimen.  Objective: Vital signs in last 24 hours: Vitals:   08/31/16 0952 08/31/16 1632 08/31/16 2144 09/01/16 0553  BP: 118/70 112/62 139/64 129/67  Pulse: 63 78 83 (!) 57  Resp: 16 18 18 18   Temp: 98.1 F (36.7 C) 98.3 F (36.8 C) 97.6 F (36.4 C) 98.1 F (36.7 C)  TempSrc: Oral     SpO2: 98% 100% 97% 100%  Weight: 114 lb 1.6 oz (51.8 kg)     Height:       24-hour weight change: Filed Weights   08/30/16 2355 08/31/16 0952  Weight: 112 lb 14.4 oz (51.2 kg) 114 lb 1.6 oz (51.8 kg)   Physical Exam: Physical Exam  Constitutional: She appears well-developed. She is cooperative. No distress.  Cardiovascular: Normal rate, regular rhythm, normal heart sounds and normal pulses.  Exam reveals no gallop.   No murmur heard. Pulmonary/Chest: Effort normal and breath sounds normal. No respiratory distress. Breasts are symmetrical.  Abdominal: Soft. Bowel sounds are normal. She exhibits no distension. There is tenderness (moderate) in the left upper quadrant. There is guarding (voluntary). There is no rigidity, no rebound and no CVA tenderness.  Musculoskeletal: She exhibits no edema.   Labs: CBC:  Recent Labs Lab 08/31/16 0202 09/01/16 0327  WBC 9.0 6.5  HGB 12.5 9.5*  HCT 36.5 29.2*  MCV 77.5* 77.5*  PLT 756* 433   Metabolic Panel:  Recent Labs Lab 08/31/16 0202 08/31/16 1216 09/01/16 0327  NA 138  --  143  K 3.4*  --  4.0  CL 101  --  111  CO2 28  --  25  GLUCOSE 179*  --  90  BUN 11  --  6  CREATININE 0.78  --  0.72  CALCIUM 10.1  --  8.2*  MG  --  1.3*  --   PHOS  --  2.5  --   ALT 12*  --  10*  ALKPHOS 64  --  41  BILITOT 0.2*  --  0.6  PROT 9.0*  --  5.1*  ALBUMIN 4.5  --  2.8*  LIPASE 46  --   --   BG:  Recent Labs Lab 08/31/16 1907 08/31/16 2006 09/01/16 0038 09/01/16 0416 09/01/16 0809  GLUCAP 101*  100* 94 86 106*     Lab Results  Component Value Date   HGBA1C 8.6 05/05/2016     Medications: . sodium chloride    Scheduled Medications: . buprenorphine-naloxone  2 tablet Sublingual Daily  . docusate sodium  100 mg Oral BID  . enoxaparin (LOVENOX) injection  40 mg Subcutaneous Q24H  . gabapentin  900 mg Oral QHS  . insulin aspart  0-9 Units Subcutaneous Q4H  . senna  1 tablet Oral BID   PRN Medications: ketorolac, oxyCODONE-acetaminophen, promethazine  Assessment/Plan: 61 year old woman with history of pancreatic divisum s/p ERCP with papillotomy, stent placed in 07/2010 and removed 08/2010, chronic abdominal pain, CVA, HLD, HTN, migraine headache, beta thalassemia, PE s/p coumadin x 1 year, seizures, T2DM, uterine cancer presenting with abdominal pain.  Acute on Chronic Abdominal Pain: improved with percocet today. Hungry and requesting to advance diet today, will try clears. Continue IVF until PO intake increases. LUQ tenderness persists, unclear etiology. W/u has been extensive in the past with previous suggestions of chronic pancreatitis though no clear evidence of chronic pancreatitis on imaging. Pt was  on Creon with some benefit until Fall 2017, after d/c began to require bland diet.  Last admission, pt had good success with resolution of symptoms after aggressive bowel regimen, agreeable to attempt today. - Clear diet, ADAT - continue NS@75ml /hr until PO intake improves - Continue buprenorphine BID and gabapentin + Percocet 5-325mg  q6h PRN and Toradol 15mg  q6hr prn pain - Phenergan prn nausea - Continue Senna/docusate BID, tap water enema today  Fat intolerance: Pt had some success with Creon therapy until Fall of 2017 when this was discontinued. Since then she has had to eat a bland diet or she develops diarrhea. We would consider restarting Creon therapy in this setting, though there is still no clear evidence of chronic pancreatitis.  T2DM: Last A1c 8.6 in January,  should continue to have regular f/u with PCP. -CBG q4hr and SSI -Held home metformin  Length of Stay: 0 day(s) Dispo: Anticipated discharge in approximately 1 day when tolerating diet and pain controlled.  Eileen Raring, MD Pager: [phone] (7AM-5PM) 09/01/2016, 11:37 AM

## 2016-09-01 NOTE — Care Management Obs Status (Signed)
[facility] NOTIFICATION   Patient Details  Name: Ceira Hoeschen MRN: [id number] Date of Birth: 1956-04-23   Medicare Observation Status Notification Given:  Yes    Jailynn, [place], RN 09/01/2016, 6:50 PM

## 2016-09-01 NOTE — Progress Notes (Signed)
Subjective: Pt's pain is improved to 8/10 on percocet. She reports one small BM with unremarkable stool, and requests food.  Objective: Vital signs in last 24 hours: Vitals:   08/31/16 0952 08/31/16 1632 08/31/16 2144 09/01/16 0553  BP: 118/70 112/62 139/64 129/67  Pulse: 63 78 83 (!) 57  Resp: 16 18 18 18   Temp: 98.1 F (36.7 C) 98.3 F (36.8 C) 97.6 F (36.4 C) 98.1 F (36.7 C)  TempSrc: Oral     SpO2: 98% 100% 97% 100%  Weight: 51.8 kg (114 lb 1.6 oz)     Height:       Weight change: 0.544 kg (1 lb 3.2 oz)  Intake/Output Summary (Last 24 hours) at 09/01/16 1142 Last data filed at 09/01/16 0944  Gross per 24 hour  Intake             1395 ml  Output                0 ml  Net             1395 ml   BP 129/67 (BP Location: Left Arm)   Pulse (!) 57   Temp 98.1 F (36.7 C)   Resp 18   Ht 5' (1.524 m)   Wt 51.8 kg (114 lb 1.6 oz)   SpO2 100%   BMI 22.28 kg/m   General Appearance:    Alert, cooperative, appears stated age, in pain but conversant.   Abdomen:     Soft, bowel sounds present in all four quadrants, extremely TTP LUQ, guarding present, no rigidity, no distention, no masses.   Lab Results:  CBC    Component Value Date/Time   WBC 6.5 09/01/2016 0327   RBC 3.77 (L) 09/01/2016 0327   HGB 9.5 (L) 09/01/2016 0327   HGB 12.7 10/13/2006 1445   HCT 29.2 (L) 09/01/2016 0327   HCT 36.8 10/13/2006 1445   PLT 328 09/01/2016 0327   PLT 365 10/13/2006 1445   MCV 77.5 (L) 09/01/2016 0327   MCV 81.3 10/13/2006 1445   MCH 25.2 (L) 09/01/2016 0327   MCHC 32.5 09/01/2016 0327   RDW 14.6 09/01/2016 0327   RDW 14.5 10/13/2006 1445   LYMPHSABS 3.7 10/23/2013 1458   LYMPHSABS 3.5 (H) 10/13/2006 1445   MONOABS 0.6 10/23/2013 1458   MONOABS 0.5 10/13/2006 1445   EOSABS 0.1 10/23/2013 1458   EOSABS 0.1 10/13/2006 1445   BASOSABS 0.0 10/23/2013 1458   BASOSABS 0.3 (H) 10/13/2006 1445   BMP Latest Ref Rng & Units 09/01/2016 08/31/2016 10/28/2013  Glucose 65 - 99 mg/dL 90  179(H) 144(H)  BUN 6 - 20 mg/dL 6 11 3(L)  Creatinine 0.44 - 1.00 mg/dL 0.72 0.78 0.53  Sodium 135 - 145 mmol/L 143 138 143  Potassium 3.5 - 5.1 mmol/L 4.0 3.4(L) 3.7  Chloride 101 - 111 mmol/L 111 101 105  CO2 22 - 32 mmol/L 25 28 22   Calcium 8.9 - 10.3 mg/dL 8.2(L) 10.1 9.0    Medications: I have reviewed the patient's current medications. Scheduled Meds: . buprenorphine-naloxone  2 tablet Sublingual Daily  . docusate sodium  100 mg Oral BID  . enoxaparin (LOVENOX) injection  40 mg Subcutaneous Q24H  . gabapentin  900 mg Oral QHS  . insulin aspart  0-9 Units Subcutaneous Q4H  . senna  1 tablet Oral BID   Continuous Infusions: . sodium chloride     PRN Meds: ketorolac, oxycodone-acetaminophen, promethazine.  Assessment & Plan by Problem: Ms. Renner is  a 61 YO F with a PMH of pancreas divisum, HTN, CVA, thalassemia, T2DM, uterine cancer s/p hysterectomy, and 15 pack-year smoking history who presented on 08/31/16 with severe LUQ pain radiating to back.   Acute on chronic abdominal pain: While unclear in etiology, this episode of severe LUQ abdominal pain radiating to back is consistent with pt's past episodes, which are sometimes provoked by consumption of fatty, rich or spicy foods (this episode was not). Pt discontinued Creon in 2017 per GI MD's suggestion, as pt was s/p ERCP + sphincterotomy. However, pt reports that her GI symptoms, and particularly her tolerance of fatty, rich and/or spicy foods, had improved with Creon; since discontinuing Creon in 2017, she has had to significantly limit her diet to avoid abdominal pain. Therefore, despite no current indication of acute or chronic pancreatitis, Creon could help to manage pt's recurrent abdominal pain and food intolerance. Additionally, 08/31/16 X-ray indicated significant stool burden, so bowel regimen should be continued + enema.  -Continue buprenorphine, percocet, gabapentin, toradol -Bowel regimen: docusate + senna -Enema  today -Consider restarting Creon   T2DM: A1c 8.6 05/05/16, cap glucose 115 on admission.  -Continue to hold home metformin -CBG q4h, SSI  HTN: BP was 112/62 today.  -Continue home prinzide  FENGI:Given that her pain and nausea have improved and she is requesting food, she may be advanced to clear diet. -Clear diet, advance as tolerated -Continue NS (75 mL/hr)  DVT prophylaxis: Lovenox  Code: full  Dispo: to remain in hospital for 1-2 days for observation.  This is a Careers information officer Note.  The care of the patient was discussed with Dr. Holley Raring and the assessment and plan formulated with their assistance.  Please see their attached note for official documentation of the daily encounter.   LOS: 0 days   Jeri Cos, Medical Student 09/01/2016, 11:42 AM

## 2016-09-01 NOTE — Progress Notes (Signed)
Date: 09/01/2016  Patient name: [name]  Medical record number: [id number]  Date of birth: 04-01-1956   I have seen and evaluated [name] and discussed their care with the Residency Team. [name] is a 61 year old female with a chief complaint of abdominal pain. She has a long history of abdominal issues with known pancreatic divisum s/p ERCP status post papillotomy with stenting in March 2012. The stent had always been planned to be temporary and was taken out the subsequent month. She then had what was called acute pancreatitis and hospitalization after the stent removal. She has documented in [facility] a diagnosis of chronic pancreatitis but the most recent documentation from [facility] states that she has no evidence of chronic pancreatitis. Her creatinine had been stopped in the fall of 2017. When she was on Creon, she had no dietary restrictions. After stopping the Creon, she has had to limit her diet to only bland foods so that she does not have GI upset and diarrhea. She has not lost any weight since the fall.  She was admitted with a one-day history of abdominal pain that radiated to her back along with bloating, and nausea. The pain was exacerbated by oral intake. She states that this is similar to prior episodes of "pancreatitis". On admission, her LFTs and lipase were normal as was her white blood cell count. There is a concern for acute mesenteric ischemia but lactic acid level was normal and imaging was not pursued.  This morning, she continues to have left upper quadrant pain but it is better since admission. She desires to try oral liquids. She continues to have formed bowel movements.   Of note, she has chronic pain of the abdomen, neck, and headaches. She recently saw a new pain therapist and April who switched her from oxycodone to bupropion. This is for pain and not a history of opioid abuse.  PMHx, Fam Hx, and/or Soc Hx : Other past medical history  includes a CVA, hypertension, Beta thalassemia, diabetes type 2, hyperlipidemia, a PE in 2006 diagnosed with a high probability VQ scan and treated with warfarin for one year, and seizures. Family history includes Parkinson's disease and her mother along with diabetes. Social history includes a history of tobacco use but she quit in 1999.   Vitals:   08/31/16 2144 09/01/16 0553  BP: 139/64 129/67  Pulse: 83 (!) 57  Resp: 18 18  Temp: 97.6 F (36.4 C) 98.1 F (36.7 C)  No acute distress Poor dentition with missing teeth Heart regular rate and rhythm no murmurs or gallops Lungs clear auscultation bilaterally with good airflow Abdomen positive bowel sounds all 4 quadrants, soft, positive tenderness to superficial palpation of the left upper quadrant  Cr 0.72 Lipase 46 Lactic acid 0.8 WBC 6.5 HgB 9.5  The team and I independently viewed her one view chest x-ray which showed good quality and no abnormalities.   The team and I independently viewed her abdominal film which showed moderate stool burden.  Assessment and Plan: I have seen and evaluated the patient as outlined above. I agree with the formulated Assessment and Plan as detailed in the residents' note, with the following changes: [name] is a 61 year old woman who was admitted with acute on chronic abdominal pain. She has a past medical history significant for pancreatic divisium and a questionable history of chronic pancreatitis although most recent records indicate she does not have chronic pancreatitis. It is unlikely that she has acute pancreatitis today as her  lipase is normal. A CT scan of the abdomen would be needed to definitively rule out acute pancreatitis but the risk of contrast and radiation is not worth the benefit as this pain is similar to prior episodes when imaging did not show any changes consistent with acute pancreatitis. Acute mesenteric ischemia was considered as her pain seemed to be out of portion to  examination on admission. However, she has had a normal lactic acid level, no metabolic acidosis, and is quite comfortable without any significant pain today. It is unlikely that she has gallbladder abnormalities as her liver function tests are normal. The most likely diagnosis would be either constipation or an exacerbation of her chronic abdominal pain of unknown etiology. She is responding well to symptomatic treatment and we will continue this. She is ready to try oral intake today. We have brought in her pain control regimen as she has an acute flare and needs additional opioids.  1. Acute on chronic abdominal pain - trial of liquids by mouth. Bowel regimen. Continue home bupropion dose with as needed Percocet. We will try her back on Creon when she is on a diet as she feels the Creon helped her to have a more broad diet. She can discuss long-term Creon with her gastroenterologist on follow-up.  Other issues per Dr. Jamse Arn H&P and progress note. Anticipate discharge on May 4.   Bartholomew Crews, MD 5/3/20181:18 PM

## 2016-09-02 DIAGNOSIS — I10 Essential (primary) hypertension: Secondary | ICD-10-CM | POA: Diagnosis not present

## 2016-09-02 DIAGNOSIS — Z8719 Personal history of other diseases of the digestive system: Secondary | ICD-10-CM | POA: Diagnosis not present

## 2016-09-02 DIAGNOSIS — G8929 Other chronic pain: Secondary | ICD-10-CM | POA: Diagnosis not present

## 2016-09-02 DIAGNOSIS — K9049 Malabsorption due to intolerance, not elsewhere classified: Secondary | ICD-10-CM | POA: Diagnosis not present

## 2016-09-02 DIAGNOSIS — Z8673 Personal history of transient ischemic attack (TIA), and cerebral infarction without residual deficits: Secondary | ICD-10-CM | POA: Diagnosis not present

## 2016-09-02 DIAGNOSIS — E119 Type 2 diabetes mellitus without complications: Secondary | ICD-10-CM | POA: Diagnosis not present

## 2016-09-02 DIAGNOSIS — Z86711 Personal history of pulmonary embolism: Secondary | ICD-10-CM | POA: Diagnosis not present

## 2016-09-02 DIAGNOSIS — E785 Hyperlipidemia, unspecified: Secondary | ICD-10-CM | POA: Diagnosis not present

## 2016-09-02 DIAGNOSIS — Z8542 Personal history of malignant neoplasm of other parts of uterus: Secondary | ICD-10-CM | POA: Diagnosis not present

## 2016-09-02 DIAGNOSIS — R1012 Left upper quadrant pain: Secondary | ICD-10-CM | POA: Diagnosis not present

## 2016-09-02 DIAGNOSIS — D561 Beta thalassemia: Secondary | ICD-10-CM | POA: Diagnosis not present

## 2016-09-02 DIAGNOSIS — Z7984 Long term (current) use of oral hypoglycemic drugs: Secondary | ICD-10-CM | POA: Diagnosis not present

## 2016-09-02 LAB — GLUCOSE, CAPILLARY
Glucose-Capillary: 100 mg/dL — ABNORMAL HIGH (ref 65–99)
Glucose-Capillary: 158 mg/dL — ABNORMAL HIGH (ref 65–99)
Glucose-Capillary: 167 mg/dL — ABNORMAL HIGH (ref 65–99)
Glucose-Capillary: 80 mg/dL (ref 65–99)
Glucose-Capillary: 85 mg/dL (ref 65–99)

## 2016-09-02 MED ORDER — SORBITOL 70 % SOLN
960.0000 mL | TOPICAL_OIL | Freq: Once | ORAL | Status: AC
  Administered 2016-09-02: 960 mL via RECTAL
  Filled 2016-09-02: qty 240

## 2016-09-02 MED ORDER — OXYCODONE-ACETAMINOPHEN 5-325 MG PO TABS
1.0000 | ORAL_TABLET | Freq: Three times a day (TID) | ORAL | Status: DC | PRN
  Administered 2016-09-02 – 2016-09-04 (×6): 1 via ORAL
  Filled 2016-09-02 (×6): qty 1

## 2016-09-02 MED ORDER — POLYETHYLENE GLYCOL 3350 17 G PO PACK
17.0000 g | PACK | Freq: Two times a day (BID) | ORAL | Status: DC
  Administered 2016-09-03: 17 g via ORAL
  Filled 2016-09-02 (×2): qty 1

## 2016-09-02 NOTE — Progress Notes (Signed)
Transitions of Care Pharmacy Note  Plan:  Educated on addition of consistent bowel regimen outpatient , holding metformin and lisinopril while inpatient Addressed concerns regarding Creon restart  Recommend starting miralax and sennakot outpatient  Inpatient follow-up- decision as to adding creon back or not  --------------------------------------------- [name] is an 61 y.o. female who presents with a chief complaint abdominal pain. In anticipation of discharge, pharmacy has reviewed this patient's prior to admission medication history, as well as current inpatient medications listed per the [facility].  Current medication indications, dosing, frequency, and notable side effects reviewed with patient. patient verbalized understanding of current inpatient medication regimen and is aware that the After Visit Summary when presented, will represent the most accurate medication list at discharge.   [name] expressed concerns regarding whether or not the primary team will be starting creon. Discussed need for consistent bowel regimen. Educated on ADE, indications, and dosing regimen for all medications.    Assessment: Understanding of regimen: good Understanding of indications: good Potential of compliance: good Barriers to Obtaining Medications: No  Patient instructed to contact inpatient pharmacy team with further questions or concerns if needed.    Time spent preparing for discharge counseling: 10 mins Time spent counseling patient: 5 mins   Thank you for allowing pharmacy to be a part of this patient's care.  Carlean Jews, Pharm.D. PGY1 Pharmacy Resident 5/4/20185:50 PM Pager [phone]

## 2016-09-02 NOTE — Progress Notes (Signed)
  Date: 09/02/2016  Patient name: [name]  Medical record number: [id number]  Date of birth: Aug 01, 1955   I have seen and evaluated this patient and I have discussed the plan of care with the house staff. Please see their note for complete details. I concur with their findings with the following additions/corrections: [name] was seen on AM rounds. She cont to have less pain and less opioid requirements. The cause for the acute on chronic pain flare is not clear - doubt acute or chronic pancreatitis. Agree with advancing diet and bowel regimen as constipation might be cause.  Bartholomew Crews, MD 09/02/2016, 11:47 AM

## 2016-09-02 NOTE — Progress Notes (Signed)
   Subjective: Currently, the patient is feeling mildly improved. Still distended with belly pain. No BM yet. Did not get enema yesterday. No hungry this AM, but likely related to constipation. Will attempt enema today and then ADAT.  Objective: Vitals:   09/01/16 2300 09/02/16 0400  BP: (!) 143/72 (!) 108/58  Pulse: (!) 59 60  Resp: 17 17  Temp: 98.4 F (36.9 C) 98.1 F (36.7 C)    Physical Exam: Physical Exam  Constitutional: She appears well-developed. She is cooperative. No distress.  Cardiovascular: Normal rate, regular rhythm, normal heart sounds and normal pulses.  Exam reveals no gallop.   No murmur heard. Pulmonary/Chest: Effort normal and breath sounds normal. No respiratory distress. Breasts are symmetrical.  Abdominal: Soft. Bowel sounds are normal. She exhibits no distension. There is tenderness (moderate) in the left upper quadrant. There is guarding (voluntary). There is no rigidity, no rebound and no CVA tenderness.  Musculoskeletal: She exhibits no edema.   Assessment/Plan: 61 year old woman with history of pancreatic divisum s/p ERCP with papillotomy, stent placed in 07/2010 and removed 08/2010, chronic abdominal pain, CVA, HLD, HTN, migraine headache, beta thalassemia, PE s/p coumadin x 1 year, seizures, T2DM, uterine cancer presenting with abdominal pain.  Acute on Chronic Abdominal Pain: No BM yet, will give SMOG enema today and continue oral bowel regimen. Pain improved even with weaning of percocet, less narcotic with help with constipation. - continue Clear diet today until BM, then ADAT - DC IVF - Continue buprenorphine and gabapentin + Percocet 5-325mg  q8h PRN and Toradol 15mg  q6hr prn pain, weaning narcotic - Phenergan prn nausea - Continue Senna/docusate BID, SMOG enema today  Fat intolerance: Pt had some success with Creon therapy until Fall of 2017 when this was discontinued. Since then she has had to eat a bland diet or she develops diarrhea. We would  consider restarting Creon therapy in this setting, though there is still no clear evidence of chronic pancreatitis. - fecal lactoferrin today  T2DM: Last A1c 8.6 in January, should continue to have regular f/u with PCP. -CBG q4hr and SSI -Held home metformin  Length of Stay: 0 day(s) Dispo: Anticipated discharge after resolution of constipation and tolerating diet and pain w/o additional narcotics, suspect 1-2 days.  Holley Raring, MD Pager: [phone] (7AM-5PM) 09/01/2016, 11:37 AM

## 2016-09-02 NOTE — Progress Notes (Signed)
Subjective: Pt feels better than on admission; reports 9/10 pain. Has not had BM in past 24h, but has been passing gas frequently. Tolerating clears well (ginger ale, Jell-o). Requests enema. Has been walking around room; wants to walk around floor today if possible.  Objective: Vital signs in last 24 hours: Vitals:   09/01/16 0553 09/01/16 1454 09/01/16 2300 09/02/16 0400  BP: 129/67 (!) 121/56 (!) 143/72 (!) 108/58  Pulse: (!) 57 67 (!) 59 60  Resp: 18 18 17 17   Temp: 98.1 F (36.7 C) 98.1 F (36.7 C) 98.4 F (36.9 C) 98.1 F (36.7 C)  TempSrc:  Oral Oral   SpO2: 100% 98% 97% 99%  Weight:      Height:       Weight change:   Intake/Output Summary (Last 24 hours) at 09/02/16 0820 Last data filed at 09/02/16 0400  Gross per 24 hour  Intake           996.25 ml  Output             2050 ml  Net         -1053.75 ml   BP (!) 108/58 (BP Location: Left Arm)   Pulse 60   Temp 98.1 F (36.7 C)   Resp 17   Ht 5' (1.524 m)   Wt 51.8 kg (114 lb 1.6 oz)   SpO2 99%   BMI 22.28 kg/m   General Appearance:    Alert, cooperative, no distress, appears stated age  Head:    Normocephalic, without obvious abnormality, atraumatic  Eyes:    PERRL, conjunctiva/corneas clear, EOM's intact, both eyes  Lungs:     Clear to auscultation bilaterally, respirations unlabored  Chest Wall:    No tenderness or deformity   Heart:    Regular rate and rhythm, S1 and S2 normal, no murmur, rub   or gallop  Abdomen:     Mildly distended, TTP + guarding on LUQ and LLQ, bowel sounds active all four quadrants, no masses.  Extremities:   Extremities normal, atraumatic, no cyanosis or edema  Pulses:   2+ and symmetric all extremities  Skin:   Skin color, texture, turgor normal, no rashes or lesions  Neurologic:   CNII-XII intact, normal strength, sensation and reflexes    throughout   Lab Results:  CBC: not yet available 09/02/16 BMP: not yet available 09/02/16  Studies/Results: Dg Abd Acute  W/chest  Result Date: 08/31/2016 CLINICAL DATA:  Abdominal pain EXAM: DG ABDOMEN ACUTE W/ 1V CHEST COMPARISON:  10/27/2013 FINDINGS: Moderate stool burden throughout the colon. The bowel gas pattern is normal. There is no evidence of free intraperitoneal air. No suspicious radio-opaque calculi or other significant radiographic abnormality is seen. Heart size and mediastinal contours are within normal limits. Both lungs are clear. IMPRESSION: Moderate stool burden. No evidence of bowel obstruction or free air. No acute cardiopulmonary disease. Electronically Signed   By: Rolm Baptise M.D.   On: 08/31/2016 11:04   Medications: I have reviewed the patient's current medications. Scheduled Meds: . buprenorphine-naloxone  2 tablet Sublingual Daily  . docusate sodium  100 mg Oral BID  . enoxaparin (LOVENOX) injection  40 mg Subcutaneous Q24H  . gabapentin  900 mg Oral QHS  . insulin aspart  0-9 Units Subcutaneous Q4H  . senna  1 tablet Oral BID  . sorbitol, milk of mag, mineral oil, glycerin (SMOG) enema  960 mL Rectal Once   PRN Meds: ketorolac, oxycodone-acetaminophen, promethazine.  Assessment &Plan by Problem:  Ms. Quigley is a 61 YO F with a PMH of pancreas divisum, HTN, CVA, thalassemia, T2DM, uterine cancer s/p hysterectomy, and 15 pack-year smoking history who presented on 08/31/16 with severe LUQ pain radiating to back.   Acute on chronic abdominal pain: per pt, abdominal pain is stable from yesterday, and she has been tolerating clears well. Pt reports that she did not receive her enema yesterday and has not had BM in the past 24h, though she has been passing gas frequently.  -Continue buprenorphine, percocet, gabapentin, toradol -Bowel regimen: docusate + senna -Enema today -Consider restarting Creon  -Stool elastase on first BM  T2DM:A1c 8.6 05/05/16, cap glucose 115 on admission.  -Continue to hold home metformin -CBG q4h, SSI  HTN: BP was 104/58 to -Continue home  prinzide  FENGI:Given that her pain and nausea have improved and she is tolerating clears, advance to pt's typical bland diet as tolerated.  DVT prophylaxis: Lovenox  Code: full  Dispo: to remain in hospital for 1-2 days for observation.  This is a Careers information officer Note.  The care of the patient was discussed with Dr. Holley Raring and the assessment and plan formulated with their assistance.  Please see their attached note for official documentation of the daily encounter.  LOS: 2 days  Jeri Cos, Medical Student 09/02/2016, 8:20 AM

## 2016-09-02 NOTE — Discharge Summary (Signed)
Name: [name] MRN: [id number] DOB: 08/01/1955 61 y.o. PCP: Ophelia Shoulder, MD  Date of Admission: 08/31/2016  1:49 AM Date of Discharge: 09/04/2016 Attending Physician: Bartholomew Crews, MD  Discharge Diagnosis: 1. Acute on chronic LUQ abdominal pain of unknown etiology  Active problems:  -HTN -T2DM -Beta thalassemia  -HLD  Discharge Medications: Allergies as of 09/04/2016      Reactions   Butalbital-aspirin-caffeine Anaphylaxis   Fioricet [butalbital-apap-caffeine] Other (See Comments)   Put into a coma state; "it was actually fiorinol; same family" (02/27/2012)   Shellfish-derived Products Anaphylaxis   Shrimp Flavor Anaphylaxis   unknow   Sulfonamide Derivatives Rash   Aspirin    Stomach burning   Sulfamethoxazole Rash      Medication List    TAKE these medications   Buprenorphine HCl 300 MCG Film Place 1 each inside cheek every 12 (twelve) hours.   calcium carbonate 500 MG chewable tablet Commonly known as:  TUMS - dosed in mg elemental calcium Chew 1 tablet by mouth daily as needed for indigestion or heartburn.   capsicum 0.075 % topical cream Commonly known as:  ZOSTRIX Apply 1 application topically 4 (four) times daily. As needed for pain   [facility] SENNA PLUS 8.6-50 MG tablet Generic drug:  senna-docusate TAKE 2 TABLETS BY MOUTH AT BEDTIME.   ibuprofen 800 MG tablet Commonly known as:  ADVIL,MOTRIN Take 1 tablet (800 mg total) by mouth every 8 (eight) hours as needed.   lipase/protease/amylase 36000 UNITS Cpep capsule Commonly known as:  CREON Take 72,000 units (two pills) with each meal. Take a half-dose of 36,000 units (one pill) with each snack. What changed:  See the new instructions.   lisinopril-hydrochlorothiazide 20-12.5 MG tablet Commonly known as:  PRINZIDE,ZESTORETIC Take 2 tablets by mouth daily.   metFORMIN 1000 MG tablet Commonly known as:  GLUCOPHAGE TAKE 1 TABLET BY MOUTH TWICE DAILY WITH MEALS   pantoprazole 20 MG  tablet Commonly known as:  PROTONIX TAKE 1 TABLET BY MOUTH EVERY DAY   pravastatin 40 MG tablet Commonly known as:  PRAVACHOL TAKE 1 TABLET BY MOUTH EVERY DAY   promethazine 12.5 MG tablet Commonly known as:  PHENERGAN Take 1 tablet (12.5 mg total) by mouth every 8 (eight) hours as needed for nausea or vomiting.      Disposition and follow-up:   [name] was discharged from [facility] in good condition. At the hospital follow up visit please address:  1.  Abd pain: Assess symptoms and pain control on home buprenorphine. Assess tolerance of diet, assess if improvement on Creon. T2DM: consider adding 2nd agent to metformin for glucose control.  Follow-up Appointments: Follow-up Information    [place]. Call on 09/05/2016.   Why:  Call on Monday to set up a hospital follow up appointment. Contact information: [address] [place] Course by problem list: 1. Acute on chronic LUQ abdominal pain:  Pt presented to ED on morning of 08/31/16 with severe, cramping LUQ abdominal pain radiating to back. Per pt, the pain had started the day prior and had been worsening since; it was exacerbated by PO intake. Pt endorsed nausea and abdominal distention, but denied emesis, hematochezia or melena; stool output had been normal over the past 24 hrs. Pt denied any known triggers to this episode, but reported that it was similar to past episodes of LUQ abdominal pain that had previously  been attributed to her prior diagnosis of pancreas divisum (s/p ESRP + sphincterotomy 2014, stent placement 2015 and 2016); however, the etiology of these episodes had not yet been definitively established. Despite no confirmed pancreatic involvement, pt reported some improvement of prior episodes with Creon; after discontinuing Creon in 2017 per GI doc's suggestion, she had adopted a bland diet devoid of  fatty, rich or spicy foods, which she found to be triggers of these episodes. Pt had last seen GI doc in March 2018.   Upon presentation, abdominal/chest X-ray showed moderate stool burden throughout colon, with normal gas pattern and no evidence of free intraperitoneal air. Lipase, LFTs, lactate, bili all unremarkable. Mild hypokalemia at 3.4. Pt was admitted to the floor NPO and received 0.9% NaCl IV at 75 mL/hr; buprenorphine BID, gabapentin + percocet 5-325 mg q6h PRN, toradol 15 mg 16hr for pain; phenergan prn for nausea. As pt's last admission for similar symptoms resolved after aggressive bowel regimen, pt was initiated on docusate and senna. K+ was repleted with IV KCl.    On hospital day 2 (09/01/16), pt reported some improvement in pain (8/10) and requested a diet. Pt was progressed to clears, w/ NS @ 75 mL/hr to continue until improvement of PO intake. Pt reported one small BM and passing gas, denied emesis.   On hospital day 3 (09/02/16), pt reported consistent pain (9/10) and tolerance of clears. Pt reported no BM within the past 24 hours, passing gas, had not yet received enema. Pt had been ambulating around her room, and requested to ambulate around the floor. Enema was performed and diet advanced to pt's previous diet of bland foods (no fatty, rich or spicy foods). Fecal elastase was ordered for first sufficient BM. Pt has multiple BMs throughout the day day and reported improvement in symptoms of abd distention.  By hospital day 5 (09/04/16), pt was feeling improved and able to tolerate a diet. She was discharged home with bowel regimen and re-starting Creon. She was instructed to f/u with her PCP and GI.  2. T2DM: Last A1c 8.6 in January, glucose 179 on admission. Pt received CBG q4hr and SSI. Metformin was held for duration of admission. Pt should continue to have regular f/u with PCP.   Discharge Vitals:   BP (!) 114/56 (BP Location: Right Arm)   Pulse 79   Temp 99.5 F (37.5 C) (Oral)    Resp 18   Ht 5' (1.524 m)   Wt 114 lb 1.6 oz (51.8 kg)   SpO2 100%   BMI 22.28 kg/m   Pertinent Labs, Studies, and Procedures:  As above.  Discharge Instructions: Discharge Instructions    Call MD for:  persistant nausea and vomiting    Complete by:  As directed    Call MD for:  severe uncontrolled pain    Complete by:  As directed    Call MD for:  temperature >100.4    Complete by:  As directed    Discharge instructions    Complete by:  As directed    We have not found a cause for your abdominal pain, but we have treated you for constipation. You should continue to take a regular laxative and/or stool softener to prevent future constipation while taking your pain medication. We have also recommended that your restart the Creon medication as you have had a benefit from this in the past.  Please follow up with your primary doctor when you leave the hospital, and you should also call your Gastroenterologist  to get an appointment to follow up with them.  If your pain worsens again, or you are not able to tolerate eating or drinking you should call your doctor or return to the emergency room for further evaluation.   Increase activity slowly    Complete by:  As directed       Signed: Holley Raring, MD 09/04/2016, 12:36 PM   Pager: @[phone] @

## 2016-09-03 ENCOUNTER — Encounter (HOSPITAL_COMMUNITY): Payer: Self-pay | Admitting: *Deleted

## 2016-09-03 DIAGNOSIS — Z886 Allergy status to analgesic agent status: Secondary | ICD-10-CM

## 2016-09-03 DIAGNOSIS — I10 Essential (primary) hypertension: Secondary | ICD-10-CM | POA: Diagnosis not present

## 2016-09-03 DIAGNOSIS — E119 Type 2 diabetes mellitus without complications: Secondary | ICD-10-CM | POA: Diagnosis not present

## 2016-09-03 DIAGNOSIS — Z91013 Allergy to seafood: Secondary | ICD-10-CM

## 2016-09-03 DIAGNOSIS — G8929 Other chronic pain: Secondary | ICD-10-CM | POA: Diagnosis not present

## 2016-09-03 DIAGNOSIS — D561 Beta thalassemia: Secondary | ICD-10-CM | POA: Diagnosis not present

## 2016-09-03 DIAGNOSIS — R1012 Left upper quadrant pain: Secondary | ICD-10-CM | POA: Diagnosis not present

## 2016-09-03 DIAGNOSIS — Z888 Allergy status to other drugs, medicaments and biological substances status: Secondary | ICD-10-CM

## 2016-09-03 DIAGNOSIS — Z7984 Long term (current) use of oral hypoglycemic drugs: Secondary | ICD-10-CM | POA: Diagnosis not present

## 2016-09-03 DIAGNOSIS — Z882 Allergy status to sulfonamides status: Secondary | ICD-10-CM | POA: Diagnosis not present

## 2016-09-03 DIAGNOSIS — E785 Hyperlipidemia, unspecified: Secondary | ICD-10-CM | POA: Diagnosis not present

## 2016-09-03 LAB — GLUCOSE, CAPILLARY
GLUCOSE-CAPILLARY: 133 mg/dL — AB (ref 65–99)
GLUCOSE-CAPILLARY: 169 mg/dL — AB (ref 65–99)
Glucose-Capillary: 94 mg/dL (ref 65–99)
Glucose-Capillary: 154 mg/dL — ABNORMAL HIGH (ref 65–99)
Glucose-Capillary: 137 mg/dL — ABNORMAL HIGH (ref 65–99)

## 2016-09-03 MED ORDER — SENNOSIDES-DOCUSATE SODIUM 8.6-50 MG PO TABS
2.0000 | ORAL_TABLET | Freq: Two times a day (BID) | ORAL | Status: DC
  Administered 2016-09-03 – 2016-09-04 (×3): 2 via ORAL
  Filled 2016-09-03 (×3): qty 2

## 2016-09-03 MED ORDER — POLYETHYLENE GLYCOL 3350 17 G PO PACK: 17.0000 g | PACK | Freq: Every day | ORAL | Status: DC

## 2016-09-03 MED ORDER — MAGNESIUM CITRATE PO SOLN
1.0000 | Freq: Once | ORAL | Status: AC
  Administered 2016-09-03: 1 via ORAL
  Filled 2016-09-03: qty 296

## 2016-09-03 NOTE — Progress Notes (Signed)
   Subjective: Patient was seen and examined at bedside this am. She reports having nausea and abdominal discomfort after eating macaroni and cheese yesterday evening. No vomiting. States she is currently feeling better and was able to tolerate applesauce and jello for breakfast. States she has not had any further bowel movements since the enema she received yesterday morning. She continues to ambulate in her room. No other complaints.   Objective: Vitals:   09/02/16 2257 09/03/16 0456  BP: 124/73 133/73  Pulse: 74 81  Resp: 16 16  Temp: 99.1 F (37.3 C) 99.3 F (37.4 C)    Physical Exam: Physical Exam  Constitutional: She appears well-developed. She is cooperative. No distress.  Cardiovascular: Normal rate, regular rhythm, normal heart sounds and normal pulses.  Exam reveals no gallop and no friction rub.   No murmur heard. Pulmonary/Chest: Effort normal and breath sounds normal. No respiratory distress. She has no wheezes. She has no rales. Breasts are symmetrical.  Abdominal: Soft. Bowel sounds are normal. She exhibits no distension. There is tenderness (moderate) in the left upper quadrant. There is guarding (voluntary). There is no rigidity, no rebound and no CVA tenderness.  Musculoskeletal: She exhibits no edema.   Assessment/Plan: 61 year old woman with history of pancreatic divisum s/p ERCP with papillotomy, stent placed in 07/2010 and removed 08/2010, chronic abdominal pain, CVA, HLD, HTN, migraine headache, beta thalassemia, PE s/p coumadin x 1 year, seizures, T2DM, uterine cancer presenting with abdominal pain.  Acute on Chronic Abdominal Pain: Likely related to constipation. Received SMOG enema yesterday morning. No further bowel movements since then. She did not tolerate soft diet yesterday evening, although, doing better this am.  - Continue to advance diet. Goal is for patient to be able to tolerate a soft/ bland diet which she adheres to at home.  - For constipation:  Colace 100 mg BID, Senokot-S 2 tabs BID, Miralax 17 g daily, one time dose of Mag citrate - For pain mgmt: continue buprenorphine, gabapentin, Toradol 15mg  q6hr prn pain, Percocet 5-325mg  q8h PRN (weaning) - Phenergan prn nausea  Fat intolerance: Pt had some success with Creon therapy until Fall of 2017 when this was discontinued. Since then she has had to eat a bland diet or she develops diarrhea. We would consider restarting Creon therapy in this setting, though there is still no clear evidence of chronic pancreatitis. - fecal elastase pending   T2DM: Last A1c 8.6 in January, should continue to have regular f/u with PCP. -CBG q4hr and SSI -Held home metformin  Dispo: Anticipated discharge after resolution of constipation and tolerating diet and pain w/o additional narcotics, suspect 1-2 days.  Holley Raring, MD Pager: [phone] (7AM-5PM) 09/01/2016, 11:37 AM

## 2016-09-03 NOTE — Progress Notes (Signed)
  Date: 09/03/2016  Patient name: [name]  Medical record number: [id number]  Date of birth: 04-Jun-1955   I have seen and evaluated this patient and I have discussed the plan of care with the house staff. Please see their note for complete details. I concur with their findings with the following additions/corrections: [name] was seen in the morning rounds. She states she had a bowel movement with the enema but does not know the quantity. She states she has not paced passing flatus this morning. She tried not currently and she and vegetable soup yesterday which upset her stomach and cause a lot of pain and nausea. After that, she went back to clears. Of note, she refused her MiraLAX yesterday so is only getting the Colace and senna in addition to the enema. What she is able to have regular bowel movements and tolerate a decent diet, she will be able to be discharged to home.  Bartholomew Crews, MD 09/03/2016, 2:09 PM

## 2016-09-04 LAB — GLUCOSE, CAPILLARY
GLUCOSE-CAPILLARY: 97 mg/dL (ref 65–99)
Glucose-Capillary: 135 mg/dL — ABNORMAL HIGH (ref 65–99)
Glucose-Capillary: 182 mg/dL — ABNORMAL HIGH (ref 65–99)
Glucose-Capillary: 97 mg/dL (ref 65–99)

## 2016-09-04 MED ORDER — GI COCKTAIL ~~LOC~~
30.0000 mL | Freq: Once | ORAL | Status: AC
  Administered 2016-09-04: 30 mL via ORAL
  Filled 2016-09-04: qty 30

## 2016-09-04 MED ORDER — PANCRELIPASE (LIP-PROT-AMYL) 36000-114000 UNITS PO CPEP: ORAL_CAPSULE | ORAL | 0 refills | Status: DC

## 2016-09-04 NOTE — Progress Notes (Signed)
   Subjective: Patient notes that pain is improving, still with LUQ tenderness and pain. Belly less distended. Had multiple frequent BMs yesterday and last night after bowel regimen. Did not tolerate advancing diet on Friday and was back to clears yesterday. Ready to try advancing again today.   Objective: Vitals:   09/03/16 2217 09/04/16 0449  BP: 126/63 (!) 114/56  Pulse: 88 79  Resp: 18 18  Temp: 98.9 F (37.2 C) 99.5 F (37.5 C)    Physical Exam: Physical Exam  Constitutional: She appears well-developed. She is cooperative. No distress.  Cardiovascular: Normal rate, regular rhythm, normal heart sounds and normal pulses.  Exam reveals no gallop and no friction rub.   No murmur heard. Pulmonary/Chest: Effort normal and breath sounds normal. No respiratory distress. She has no wheezes. She has no rales. Breasts are symmetrical.  Abdominal: Soft. Bowel sounds are normal. She exhibits no distension. There is tenderness (moderate) in the left upper quadrant. There is guarding (voluntary). There is no rigidity, no rebound and no CVA tenderness.  Musculoskeletal: She exhibits no edema.   Assessment/Plan: 61 year old woman with history of pancreatic divisum s/p ERCP with papillotomy, stent placed in 07/2010 and removed 08/2010, chronic abdominal pain, CVA, HLD, HTN, migraine headache, beta thalassemia, PE s/p coumadin x 1 year, seizures, T2DM, uterine cancer presenting with abdominal pain.  Acute on Chronic Abdominal Pain: Better today. Possibly 2/2 constipation vs recurrence of idiopathic pain. Multiple BMs yesterday, will deescalate bowel regimen. Try GI cocktail. - Continue to advance diet. Goal of soft, bland diet.  - Senokot-S 2 tabs BID - Buprenorphine, gabapentin, Toradol 15mg  q6hr prn pain, Percocet 5-325mg  q8h PRN - Phenergan prn nausea  Fat intolerance: Consider restarting Creon for reported benefit, though no clear evidence of chronic pancreatitis. - fecal elastase pending    T2DM: Last A1c 8.6 in January, should continue to have regular f/u with PCP. -CBG q4hr and SSI -Hold home metformin  Dispo: Anticipated discharge when tolerating diet, today/tomorrow.  Holley Raring, MD Pager: [phone] (7AM-5PM) 09/04/2016

## 2016-09-09 ENCOUNTER — Other Ambulatory Visit: Payer: Self-pay | Admitting: Internal Medicine

## 2016-09-09 DIAGNOSIS — Q453 Other congenital malformations of pancreas and pancreatic duct: Secondary | ICD-10-CM

## 2016-09-09 LAB — PANCREATIC ELASTASE, FECAL: Pancreatic Elastase-1, Stool: 234 ug Elast./g (ref >200)

## 2016-09-13 DIAGNOSIS — R51 Headache: Secondary | ICD-10-CM | POA: Diagnosis not present

## 2016-09-13 DIAGNOSIS — M5412 Radiculopathy, cervical region: Secondary | ICD-10-CM | POA: Diagnosis not present

## 2016-09-13 DIAGNOSIS — R1012 Left upper quadrant pain: Secondary | ICD-10-CM | POA: Diagnosis not present

## 2016-09-13 DIAGNOSIS — M542 Cervicalgia: Secondary | ICD-10-CM | POA: Diagnosis not present

## 2016-09-21 DIAGNOSIS — G8929 Other chronic pain: Secondary | ICD-10-CM

## 2016-09-21 HISTORY — DX: Other chronic pain: G89.29

## 2016-09-22 ENCOUNTER — Encounter: Payer: Medicare HMO | Admitting: Internal Medicine

## 2016-10-03 DIAGNOSIS — G8929 Other chronic pain: Secondary | ICD-10-CM | POA: Diagnosis not present

## 2016-10-03 DIAGNOSIS — R51 Headache: Secondary | ICD-10-CM | POA: Diagnosis not present

## 2016-10-03 DIAGNOSIS — M542 Cervicalgia: Secondary | ICD-10-CM | POA: Diagnosis not present

## 2016-10-03 DIAGNOSIS — K861 Other chronic pancreatitis: Secondary | ICD-10-CM | POA: Diagnosis not present

## 2016-10-03 DIAGNOSIS — R1012 Left upper quadrant pain: Secondary | ICD-10-CM | POA: Diagnosis not present

## 2016-10-10 ENCOUNTER — Encounter: Payer: Self-pay | Admitting: *Deleted

## 2016-10-10 DIAGNOSIS — Z91013 Allergy to seafood: Secondary | ICD-10-CM | POA: Diagnosis not present

## 2016-10-10 DIAGNOSIS — R1012 Left upper quadrant pain: Secondary | ICD-10-CM | POA: Diagnosis not present

## 2016-10-10 DIAGNOSIS — E119 Type 2 diabetes mellitus without complications: Secondary | ICD-10-CM | POA: Diagnosis not present

## 2016-10-10 DIAGNOSIS — Z882 Allergy status to sulfonamides status: Secondary | ICD-10-CM | POA: Diagnosis not present

## 2016-10-10 DIAGNOSIS — G8929 Other chronic pain: Secondary | ICD-10-CM | POA: Diagnosis not present

## 2016-10-10 DIAGNOSIS — K8681 Exocrine pancreatic insufficiency: Secondary | ICD-10-CM | POA: Diagnosis not present

## 2016-10-10 DIAGNOSIS — K861 Other chronic pancreatitis: Secondary | ICD-10-CM | POA: Diagnosis not present

## 2016-10-10 DIAGNOSIS — R109 Unspecified abdominal pain: Secondary | ICD-10-CM | POA: Diagnosis not present

## 2016-10-10 DIAGNOSIS — I10 Essential (primary) hypertension: Secondary | ICD-10-CM | POA: Diagnosis not present

## 2016-10-10 DIAGNOSIS — Z87891 Personal history of nicotine dependence: Secondary | ICD-10-CM | POA: Diagnosis not present

## 2016-10-19 DIAGNOSIS — M549 Dorsalgia, unspecified: Secondary | ICD-10-CM | POA: Diagnosis not present

## 2016-10-19 DIAGNOSIS — R1012 Left upper quadrant pain: Secondary | ICD-10-CM | POA: Diagnosis not present

## 2016-10-19 DIAGNOSIS — M542 Cervicalgia: Secondary | ICD-10-CM | POA: Diagnosis not present

## 2016-10-19 DIAGNOSIS — M5412 Radiculopathy, cervical region: Secondary | ICD-10-CM | POA: Diagnosis not present

## 2016-11-28 DIAGNOSIS — M549 Dorsalgia, unspecified: Secondary | ICD-10-CM | POA: Diagnosis not present

## 2016-11-28 DIAGNOSIS — Z79899 Other long term (current) drug therapy: Secondary | ICD-10-CM | POA: Diagnosis not present

## 2016-11-28 DIAGNOSIS — R51 Headache: Secondary | ICD-10-CM | POA: Diagnosis not present

## 2016-11-28 DIAGNOSIS — Z5181 Encounter for therapeutic drug level monitoring: Secondary | ICD-10-CM | POA: Diagnosis not present

## 2016-11-28 DIAGNOSIS — M542 Cervicalgia: Secondary | ICD-10-CM | POA: Diagnosis not present

## 2016-11-28 DIAGNOSIS — R1012 Left upper quadrant pain: Secondary | ICD-10-CM | POA: Diagnosis not present

## 2016-12-01 ENCOUNTER — Other Ambulatory Visit: Payer: Self-pay

## 2016-12-01 MED ORDER — METFORMIN HCL 1000 MG PO TABS: 1000.0000 mg | ORAL_TABLET | Freq: Two times a day (BID) | ORAL | 3 refills | Status: DC

## 2016-12-01 NOTE — Telephone Encounter (Signed)
Requesting metformin to be filled.

## 2016-12-02 ENCOUNTER — Ambulatory Visit (INDEPENDENT_AMBULATORY_CARE_PROVIDER_SITE_OTHER): Payer: Medicare HMO | Admitting: Internal Medicine

## 2016-12-02 VITALS — BP 156/75 | HR 78 | Temp 98.2°F | Ht 60.0 in | Wt 108.7 lb

## 2016-12-02 DIAGNOSIS — Z1231 Encounter for screening mammogram for malignant neoplasm of breast: Secondary | ICD-10-CM

## 2016-12-02 DIAGNOSIS — Z87891 Personal history of nicotine dependence: Secondary | ICD-10-CM | POA: Diagnosis not present

## 2016-12-02 DIAGNOSIS — E119 Type 2 diabetes mellitus without complications: Secondary | ICD-10-CM

## 2016-12-02 DIAGNOSIS — Z8249 Family history of ischemic heart disease and other diseases of the circulatory system: Secondary | ICD-10-CM | POA: Diagnosis not present

## 2016-12-02 DIAGNOSIS — Z7984 Long term (current) use of oral hypoglycemic drugs: Secondary | ICD-10-CM | POA: Diagnosis not present

## 2016-12-02 DIAGNOSIS — I10 Essential (primary) hypertension: Secondary | ICD-10-CM | POA: Diagnosis not present

## 2016-12-02 DIAGNOSIS — Z833 Family history of diabetes mellitus: Secondary | ICD-10-CM | POA: Diagnosis not present

## 2016-12-02 DIAGNOSIS — Z79899 Other long term (current) drug therapy: Secondary | ICD-10-CM | POA: Diagnosis not present

## 2016-12-02 HISTORY — DX: Encounter for screening mammogram for malignant neoplasm of breast: Z12.31

## 2016-12-02 LAB — POCT GLYCOSYLATED HEMOGLOBIN (HGB A1C): HEMOGLOBIN A1C: 7.5

## 2016-12-02 LAB — GLUCOSE, CAPILLARY: Glucose-Capillary: 181 mg/dL — ABNORMAL HIGH (ref 65–99)

## 2016-12-02 MED ORDER — LISINOPRIL-HYDROCHLOROTHIAZIDE 20-12.5 MG PO TABS: 2.0000 | ORAL_TABLET | Freq: Every day | ORAL | 0 refills | Status: DC

## 2016-12-02 MED ORDER — AMLODIPINE BESYLATE 5 MG PO TABS: 5.0000 mg | ORAL_TABLET | Freq: Every day | ORAL | 0 refills | Status: DC

## 2016-12-02 MED ORDER — METFORMIN HCL 1000 MG PO TABS: 1000.0000 mg | ORAL_TABLET | Freq: Two times a day (BID) | ORAL | 3 refills | Status: DC

## 2016-12-02 NOTE — Assessment & Plan Note (Addendum)
Assessment  Initial blood pressure 176/72 and repeat 156/75 at this visit. Patient reports compliance with lisinopril-hydrochlorothiazide 40-25 mg daily.  Plan -Continue lisinopril-hydrochlorothiazide -Start amlodipine 5 mg daily -Return to the clinic in 1 month for blood pressure recheck

## 2016-12-02 NOTE — Progress Notes (Signed)
   CC: Patient is requesting a refill on Metformin. Diabetes and HTN were discussed during this visit.   HPI:  [name] is a 61 y.o. female with a PMHx of conditions listed below presenting to the clinic requesting a refill on Metformin. Diabetes and HTN were discussed during this visit. Please see problem based charting for the status of the patient's current and chronic medical conditions.    Past Medical History:  Diagnosis Date  . Asthma    childhood asthma  . Beta thalassemia ([place])   . Blood dyscrasia    thalasemia, sickle cell trait  . Chronic abdominal pain    Unclear etiology, thought to be due to chronic pancreatitis previously in setting of her pancreatic divisum - however, EUS and EGD performed at [facility]  (10/05/2011) showing normal esophageall, gastric, duodenal mucosa. EUS showing no pancreatic masses, cysts, or changes of chronic pancreatitis. Biliary system nondilated and had no endosonographic abnormalities.  . CVA (cerebral infarction) 1993   Per report by patient she had a stroke and prolonged rehab course eventually resolving her right sided weakness, as per her hx obtained 08/2007  . HLD (hyperlipidemia) 2009  . Hypertension   . Migraine headache    "q other year now" (11/20/2012)  . Pancreatic divisum    S/P ERCP with stenting 07/2010 at [facility], then stent removal (08/05/2010)  . Pneumonia ~ 1969  . Pulmonary embolus ([place]) 08/2004   Two areas of V/Q mismatch. Findings compatible  with high probability for pulmonary embolus.; pt was on coumadin for 1 year  . Seizures ([place])    "after the stroke in 1993; none for years now" 11/20/2012)  . Sickle cell trait ([place])   . Stroke [facility]) 1993   very small amount of left sided weakness  . Thalassemia   . Type II diabetes mellitus ([place]) 2010   well controlled  . Uterine cancer ([place])    Review of Systems: Pertinent positives mentioned in HPI. Remainder of all ROS negative.   Physical Exam:  Vitals:   12/02/16 0949 12/02/16 1003  BP: (!) 176/72 (!) 156/75  Pulse: 76 78  Temp: 98.2 F (36.8 C)   TempSrc: Oral   SpO2: 100%   Weight: 108 lb 11.2 oz (49.3 kg)   Height: 5' (1.524 m)    Physical Exam  Constitutional: She is oriented to person, place, and time. No distress.  HENT:  Head: Normocephalic and atraumatic.  Eyes: Right eye exhibits no discharge. Left eye exhibits no discharge.  Cardiovascular: Normal rate, regular rhythm and intact distal pulses.  Exam reveals no gallop and no friction rub.   No murmur heard. Pulmonary/Chest: Effort normal and breath sounds normal. No respiratory distress. She has no wheezes. She has no rales.  Abdominal: Soft. Bowel sounds are normal. She exhibits no distension. There is no tenderness.  Musculoskeletal: She exhibits no edema.  Neurological: She is alert and oriented to person, place, and time.  Skin: Skin is warm and dry.  Psychiatric: She has a normal mood and affect.    Assessment & Plan:   See Encounters Tab for problem based charting.  Patient discussed with Dr. Daryll Drown

## 2016-12-02 NOTE — Assessment & Plan Note (Signed)
Assessment A1c improved to 7.5 at this visit. Patient reports compliance with metformin 1000 mg twice daily.  Plan -Continue metformin -Referral to ophthalmology for annual eye exam

## 2016-12-02 NOTE — Assessment & Plan Note (Signed)
Patient is requesting a referral for a mammogram. She had a normal screening mammogram in March 2017.  -Screening mammogram has been ordered

## 2016-12-02 NOTE — Patient Instructions (Addendum)
Ms. Berrey it was nice meeting you today.  -Start taking amlodipine 5 mg once daily  -Continue taking lisinopril-hydrochlorothiazide 20-12.5 mg 2 tablets daily  -Continue taking metformin 1000 mg twice daily  -I have given you a referral to ophthalmology (eye specialist)   -Screening mammogram has also been ordered  -Return for a follow-up in one month

## 2016-12-05 ENCOUNTER — Telehealth: Payer: Self-pay | Admitting: *Deleted

## 2016-12-05 NOTE — Progress Notes (Signed)
Internal Medicine Clinic Attending  Case discussed with Dr. Rathoreat the time of the visit. We reviewed the resident's history and exam and pertinent patient test results. I agree with the assessment, diagnosis, and plan of care documented in the resident's note.  

## 2016-12-05 NOTE — Telephone Encounter (Signed)
LVM FOR PATIENT REGARDING HER EYE REFERRAL/ LAST SEEN [facility] /  DR Wynetta Emery.  PATIENT TO LET ME KNOW IF SHE WANTS TO GO BACK TO [facility] OR SOMEWHERE ELSE.

## 2016-12-19 DIAGNOSIS — M5412 Radiculopathy, cervical region: Secondary | ICD-10-CM | POA: Diagnosis not present

## 2016-12-19 DIAGNOSIS — M549 Dorsalgia, unspecified: Secondary | ICD-10-CM | POA: Diagnosis not present

## 2016-12-19 DIAGNOSIS — R1012 Left upper quadrant pain: Secondary | ICD-10-CM | POA: Diagnosis not present

## 2016-12-19 DIAGNOSIS — M542 Cervicalgia: Secondary | ICD-10-CM | POA: Diagnosis not present

## 2016-12-30 ENCOUNTER — Other Ambulatory Visit: Payer: Self-pay | Admitting: Internal Medicine

## 2016-12-30 DIAGNOSIS — I10 Essential (primary) hypertension: Secondary | ICD-10-CM

## 2016-12-30 DIAGNOSIS — R002 Palpitations: Secondary | ICD-10-CM

## 2017-01-06 ENCOUNTER — Other Ambulatory Visit: Payer: Self-pay | Admitting: Internal Medicine

## 2017-01-06 DIAGNOSIS — I10 Essential (primary) hypertension: Secondary | ICD-10-CM

## 2017-01-06 DIAGNOSIS — Q453 Other congenital malformations of pancreas and pancreatic duct: Secondary | ICD-10-CM

## 2017-01-09 MED ORDER — PROMETHAZINE HCL 12.5 MG PO TABS: ORAL_TABLET | ORAL | 0 refills | Status: DC

## 2017-01-09 NOTE — Telephone Encounter (Signed)
Received refill request from pt's pharmacy for lisinopril-hctz.  Per pharmacy, pt already has a refill on file, there request was denied.  Pt was contacted and informed and she stated that she will be needing a refill on her promethazine which she states she takes as needed.Despina Hidden Cassady9/10/20189:38 AM  Will send to MD for review, please advise.Despina Hidden Cassady9/10/20189:56 AM

## 2017-01-17 ENCOUNTER — Other Ambulatory Visit: Payer: Self-pay | Admitting: Internal Medicine

## 2017-01-17 ENCOUNTER — Other Ambulatory Visit: Payer: Self-pay | Admitting: Oncology

## 2017-01-17 DIAGNOSIS — Z1231 Encounter for screening mammogram for malignant neoplasm of breast: Secondary | ICD-10-CM

## 2017-01-18 DIAGNOSIS — M542 Cervicalgia: Secondary | ICD-10-CM | POA: Diagnosis not present

## 2017-01-18 DIAGNOSIS — R51 Headache: Secondary | ICD-10-CM | POA: Diagnosis not present

## 2017-01-18 DIAGNOSIS — R1012 Left upper quadrant pain: Secondary | ICD-10-CM | POA: Diagnosis not present

## 2017-01-18 DIAGNOSIS — M549 Dorsalgia, unspecified: Secondary | ICD-10-CM | POA: Diagnosis not present

## 2017-01-27 ENCOUNTER — Other Ambulatory Visit: Payer: Self-pay | Admitting: Internal Medicine

## 2017-01-27 ENCOUNTER — Ambulatory Visit
Admission: RE | Admit: 2017-01-27 | Discharge: 2017-01-27 | Disposition: A | Discharge status: Home / Self Care | Payer: Medicare HMO | Source: Ambulatory Visit | Attending: Student in an Organized Health Care Education/Training Program | Admitting: Student in an Organized Health Care Education/Training Program

## 2017-01-27 DIAGNOSIS — Q453 Other congenital malformations of pancreas and pancreatic duct: Secondary | ICD-10-CM

## 2017-01-27 DIAGNOSIS — Z1231 Encounter for screening mammogram for malignant neoplasm of breast: Secondary | ICD-10-CM | POA: Diagnosis not present

## 2017-01-27 MED ORDER — PROMETHAZINE HCL 12.5 MG PO TABS: ORAL_TABLET | ORAL | 0 refills | Status: DC

## 2017-01-27 NOTE — Telephone Encounter (Signed)
Refill Request    Promethazine

## 2017-02-01 ENCOUNTER — Encounter: Payer: Self-pay | Admitting: Internal Medicine

## 2017-02-02 ENCOUNTER — Other Ambulatory Visit: Payer: Self-pay | Admitting: Internal Medicine

## 2017-02-02 NOTE — Telephone Encounter (Signed)
Pt wanted to know about lyrica, needs PA

## 2017-02-02 NOTE — Telephone Encounter (Signed)
PLEASE CALL HER ABOUT MEDICATION REFILLS

## 2017-02-07 DIAGNOSIS — E119 Type 2 diabetes mellitus without complications: Secondary | ICD-10-CM | POA: Diagnosis not present

## 2017-02-07 LAB — HM DIABETES EYE EXAM

## 2017-02-21 ENCOUNTER — Encounter: Payer: Self-pay | Admitting: *Deleted

## 2017-02-24 ENCOUNTER — Ambulatory Visit (INDEPENDENT_AMBULATORY_CARE_PROVIDER_SITE_OTHER): Payer: Medicare HMO | Admitting: Internal Medicine

## 2017-02-24 ENCOUNTER — Other Ambulatory Visit: Payer: Self-pay | Admitting: Internal Medicine

## 2017-02-24 DIAGNOSIS — R51 Headache: Secondary | ICD-10-CM | POA: Diagnosis not present

## 2017-02-24 DIAGNOSIS — Z23 Encounter for immunization: Secondary | ICD-10-CM | POA: Diagnosis not present

## 2017-02-24 DIAGNOSIS — F4320 Adjustment disorder, unspecified: Secondary | ICD-10-CM

## 2017-02-24 DIAGNOSIS — F4321 Adjustment disorder with depressed mood: Secondary | ICD-10-CM

## 2017-02-24 HISTORY — DX: Adjustment disorder, unspecified: F43.20

## 2017-02-24 HISTORY — DX: Adjustment disorder with depressed mood: F43.21

## 2017-02-24 MED ORDER — TRAZODONE HCL 100 MG PO TABS: 50.0000 mg | ORAL_TABLET | Freq: Every day | ORAL | 0 refills | Status: DC

## 2017-02-24 NOTE — Assessment & Plan Note (Signed)
Patient is here with complaint of headaches that she feels is due to recent insomnia issues after the passing of her mother. She is tearful today during the interview while talking about her mom. She reports appropriate symptoms of grieving. Her mom passed away unexpectedly due to complications during a hospitalization for a hip fracture. Her mom wad discharged on hospice and passed away three weeks ago. She is requesting something to help her sleep. She is seeking grief counseling through the hospice agency used for her mom. I expressed empathy and provided emotional support.  -- Continue grief counseling  -- Trazodone 50-100 mg QHS prn  -- Follow up as needed

## 2017-02-24 NOTE — Patient Instructions (Signed)
Ms. Fujii,  It was a pleasure to see you today. I am so sorry to hear about your mom. For sleep and anxiey, you may take 1/2 to 1 tablet at night as needed for sleep. If you have any questions or concerns, call our clinic at [phone] or after hours call [phone] and ask for the internal medicine resident on call. Thank you!  - Dr. Philipp Ovens

## 2017-02-24 NOTE — Progress Notes (Signed)
   CC: Headache  HPI:  [name] is a 61 y.o. female with past medical history outlined below here for headache. For the details of today's visit, please refer to the assessment and plan.  Past Medical History:  Diagnosis Date  . Asthma    childhood asthma  . Beta thalassemia ([place])   . Blood dyscrasia    thalasemia, sickle cell trait  . Chronic abdominal pain    Unclear etiology, thought to be due to chronic pancreatitis previously in setting of her pancreatic divisum - however, EUS and EGD performed at [facility]  (10/05/2011) showing normal esophageall, gastric, duodenal mucosa. EUS showing no pancreatic masses, cysts, or changes of chronic pancreatitis. Biliary system nondilated and had no endosonographic abnormalities.  . CVA (cerebral infarction) 1993   Per report by patient she had a stroke and prolonged rehab course eventually resolving her right sided weakness, as per her hx obtained 08/2007  . HLD (hyperlipidemia) 2009  . Hypertension   . Migraine headache    "q other year now" (11/20/2012)  . Pancreatic divisum    S/P ERCP with stenting 07/2010 at [facility], then stent removal (08/05/2010)  . Pneumonia ~ 1969  . Pulmonary embolus ([place]) 08/2004   Two areas of V/Q mismatch. Findings compatible  with high probability for pulmonary embolus.; pt was on coumadin for 1 year  . Seizures ([place])    "after the stroke in 1993; none for years now" 11/20/2012)  . Sickle cell trait ([place])   . Stroke [facility]) 1993   very small amount of left sided weakness  . Thalassemia   . Type II diabetes mellitus ([place]) 2010   well controlled  . Uterine cancer ([place])     ROS   Physical Exam:  Vitals:   02/24/17 0932  BP: 124/77  Pulse: 81  Temp: 98.1 F (36.7 C)  TempSrc: Oral  SpO2: 100%  Weight: 102 lb 6.4 oz (46.4 kg)  Height: 5' (1.524 m)    Constitutional: NAD, appears comfortable Cardiovascular: RRR, no murmurs, rubs, or gallops.  Pulmonary/Chest: CTAB, no wheezes,  rales, or rhonchi.  Extremities: Warm and well perfused. Distal pulses intact. No edema.  Psychiatric: Tearful, depressed mood, appropriate affect  Assessment & Plan:   See Encounters Tab for problem based charting.  Patient discussed with Dr. Dareen Piano

## 2017-02-27 DIAGNOSIS — G8929 Other chronic pain: Secondary | ICD-10-CM | POA: Diagnosis not present

## 2017-02-27 DIAGNOSIS — G894 Chronic pain syndrome: Secondary | ICD-10-CM | POA: Diagnosis not present

## 2017-02-27 DIAGNOSIS — R51 Headache: Secondary | ICD-10-CM | POA: Diagnosis not present

## 2017-02-27 DIAGNOSIS — Z79899 Other long term (current) drug therapy: Secondary | ICD-10-CM | POA: Diagnosis not present

## 2017-02-27 DIAGNOSIS — Z5181 Encounter for therapeutic drug level monitoring: Secondary | ICD-10-CM | POA: Diagnosis not present

## 2017-02-27 DIAGNOSIS — R1012 Left upper quadrant pain: Secondary | ICD-10-CM | POA: Diagnosis not present

## 2017-03-05 NOTE — Progress Notes (Signed)
Internal Medicine Clinic Attending  Case discussed with Dr. Guilloud at the time of the visit.  We reviewed the resident's history and exam and pertinent patient test results.  I agree with the assessment, diagnosis, and plan of care documented in the resident's note.  

## 2017-03-14 ENCOUNTER — Other Ambulatory Visit: Payer: Self-pay | Admitting: Internal Medicine

## 2017-03-14 DIAGNOSIS — Q453 Other congenital malformations of pancreas and pancreatic duct: Secondary | ICD-10-CM

## 2017-03-14 NOTE — Telephone Encounter (Signed)
Needs refill on promethavine 12.5mg , to [facility] [phone]

## 2017-03-15 MED ORDER — PROMETHAZINE HCL 12.5 MG PO TABS: ORAL_TABLET | ORAL | 0 refills | Status: DC

## 2017-03-16 ENCOUNTER — Other Ambulatory Visit: Payer: Self-pay | Admitting: Student in an Organized Health Care Education/Training Program

## 2017-03-16 DIAGNOSIS — Q453 Other congenital malformations of pancreas and pancreatic duct: Secondary | ICD-10-CM

## 2017-03-17 ENCOUNTER — Telehealth: Payer: Self-pay | Admitting: *Deleted

## 2017-03-17 NOTE — Telephone Encounter (Signed)
Call from pt - upset Promethazine rx had not been refilled. Informed rx was refilled on 11/14 and [facility]. Stated [facility] not use [facility],been over 2 yrs . Rx called to [facility] per pt's request and [facility]. [facility] will remove [facility] off her chart.

## 2017-03-29 ENCOUNTER — Other Ambulatory Visit: Payer: Self-pay

## 2017-03-29 ENCOUNTER — Ambulatory Visit: Payer: Medicare HMO | Admitting: Internal Medicine

## 2017-03-29 ENCOUNTER — Inpatient Hospital Stay (HOSPITAL_COMMUNITY)
Admission: AD | Admit: 2017-03-29 | Discharge: 2017-04-03 | DRG: 439 | Disposition: A | Discharge status: Home / Self Care | Payer: Medicare HMO | Source: Ambulatory Visit | Attending: Internal Medicine | Admitting: Internal Medicine

## 2017-03-29 ENCOUNTER — Encounter (HOSPITAL_COMMUNITY): Payer: Self-pay | Admitting: General Practice

## 2017-03-29 VITALS — BP 123/65 | HR 68 | Temp 98.2°F | Wt 100.3 lb

## 2017-03-29 DIAGNOSIS — E785 Hyperlipidemia, unspecified: Secondary | ICD-10-CM | POA: Diagnosis present

## 2017-03-29 DIAGNOSIS — Q453 Other congenital malformations of pancreas and pancreatic duct: Secondary | ICD-10-CM | POA: Diagnosis not present

## 2017-03-29 DIAGNOSIS — Z888 Allergy status to other drugs, medicaments and biological substances status: Secondary | ICD-10-CM

## 2017-03-29 DIAGNOSIS — I10 Essential (primary) hypertension: Secondary | ICD-10-CM | POA: Diagnosis not present

## 2017-03-29 DIAGNOSIS — Z832 Family history of diseases of the blood and blood-forming organs and certain disorders involving the immune mechanism: Secondary | ICD-10-CM

## 2017-03-29 DIAGNOSIS — Z8042 Family history of malignant neoplasm of prostate: Secondary | ICD-10-CM | POA: Diagnosis not present

## 2017-03-29 DIAGNOSIS — R1012 Left upper quadrant pain: Secondary | ICD-10-CM

## 2017-03-29 DIAGNOSIS — Z8542 Personal history of malignant neoplasm of other parts of uterus: Secondary | ICD-10-CM | POA: Diagnosis not present

## 2017-03-29 DIAGNOSIS — Z801 Family history of malignant neoplasm of trachea, bronchus and lung: Secondary | ICD-10-CM | POA: Diagnosis not present

## 2017-03-29 DIAGNOSIS — Z882 Allergy status to sulfonamides status: Secondary | ICD-10-CM

## 2017-03-29 DIAGNOSIS — Z803 Family history of malignant neoplasm of breast: Secondary | ICD-10-CM | POA: Diagnosis not present

## 2017-03-29 DIAGNOSIS — K861 Other chronic pancreatitis: Secondary | ICD-10-CM

## 2017-03-29 DIAGNOSIS — Z91013 Allergy to seafood: Secondary | ICD-10-CM | POA: Diagnosis not present

## 2017-03-29 DIAGNOSIS — Z7984 Long term (current) use of oral hypoglycemic drugs: Secondary | ICD-10-CM | POA: Diagnosis not present

## 2017-03-29 DIAGNOSIS — E1165 Type 2 diabetes mellitus with hyperglycemia: Secondary | ICD-10-CM

## 2017-03-29 DIAGNOSIS — G8929 Other chronic pain: Secondary | ICD-10-CM | POA: Diagnosis not present

## 2017-03-29 DIAGNOSIS — Z886 Allergy status to analgesic agent status: Secondary | ICD-10-CM

## 2017-03-29 DIAGNOSIS — E119 Type 2 diabetes mellitus without complications: Secondary | ICD-10-CM | POA: Diagnosis not present

## 2017-03-29 DIAGNOSIS — R1013 Epigastric pain: Secondary | ICD-10-CM | POA: Diagnosis not present

## 2017-03-29 DIAGNOSIS — R10812 Left upper quadrant abdominal tenderness: Secondary | ICD-10-CM | POA: Diagnosis not present

## 2017-03-29 DIAGNOSIS — Z8249 Family history of ischemic heart disease and other diseases of the circulatory system: Secondary | ICD-10-CM | POA: Diagnosis not present

## 2017-03-29 DIAGNOSIS — Z800 Family history of malignant neoplasm of digestive organs: Secondary | ICD-10-CM | POA: Diagnosis not present

## 2017-03-29 DIAGNOSIS — Z87891 Personal history of nicotine dependence: Secondary | ICD-10-CM | POA: Diagnosis not present

## 2017-03-29 DIAGNOSIS — D573 Sickle-cell trait: Secondary | ICD-10-CM | POA: Diagnosis present

## 2017-03-29 DIAGNOSIS — K8689 Other specified diseases of pancreas: Secondary | ICD-10-CM | POA: Diagnosis not present

## 2017-03-29 DIAGNOSIS — Z86711 Personal history of pulmonary embolism: Secondary | ICD-10-CM

## 2017-03-29 DIAGNOSIS — Z8673 Personal history of transient ischemic attack (TIA), and cerebral infarction without residual deficits: Secondary | ICD-10-CM | POA: Diagnosis not present

## 2017-03-29 DIAGNOSIS — R109 Unspecified abdominal pain: Secondary | ICD-10-CM | POA: Diagnosis not present

## 2017-03-29 DIAGNOSIS — K859 Acute pancreatitis without necrosis or infection, unspecified: Secondary | ICD-10-CM | POA: Diagnosis not present

## 2017-03-29 HISTORY — DX: Cerebral infarction, unspecified: I63.9

## 2017-03-29 HISTORY — DX: Unspecified asthma, uncomplicated: J45.909

## 2017-03-29 HISTORY — DX: Family history of other specified conditions: Z84.89

## 2017-03-29 HISTORY — DX: Personal history of other medical treatment: Z92.89

## 2017-03-29 LAB — CBC
HEMATOCRIT: 34.9 % — AB (ref 36.0–46.0)
Hemoglobin: 11.7 g/dL — ABNORMAL LOW (ref 12.0–15.0)
MCH: 25.8 pg — AB (ref 26.0–34.0)
MCHC: 33.5 g/dL (ref 30.0–36.0)
MCV: 76.9 fL — AB (ref 78.0–100.0)
Platelets: 442 10*3/uL — ABNORMAL HIGH (ref 150–400)
RBC: 4.54 MIL/uL (ref 3.87–5.11)
RDW: 14.4 % (ref 11.5–15.5)
WBC: 7.2 10*3/uL (ref 4.0–10.5)

## 2017-03-29 LAB — AMYLASE: Amylase: 137 U/L — ABNORMAL HIGH (ref 28–100)

## 2017-03-29 LAB — COMPREHENSIVE METABOLIC PANEL
ALBUMIN: 4 g/dL (ref 3.5–5.0)
ALK PHOS: 48 U/L (ref 38–126)
ALT: 9 U/L — ABNORMAL LOW (ref 14–54)
AST: 18 U/L (ref 15–41)
Anion gap: 9 (ref 5–15)
BILIRUBIN TOTAL: 0.3 mg/dL (ref 0.3–1.2)
BUN: 10 mg/dL (ref 6–20)
CALCIUM: 10.8 mg/dL — AB (ref 8.9–10.3)
CO2: 30 mmol/L (ref 22–32)
CREATININE: 0.67 mg/dL (ref 0.44–1.00)
Chloride: 102 mmol/L (ref 101–111)
GFR calc Af Amer: >60 mL/min (ref >60)
GFR calc non Af Amer: >60 mL/min (ref >60)
GLUCOSE: 111 mg/dL — AB (ref 65–99)
Potassium: 3.8 mmol/L (ref 3.5–5.1)
Sodium: 141 mmol/L (ref 135–145)
TOTAL PROTEIN: 7.7 g/dL (ref 6.5–8.1)

## 2017-03-29 LAB — GLUCOSE, CAPILLARY
GLUCOSE-CAPILLARY: 93 mg/dL (ref 65–99)
GLUCOSE-CAPILLARY: 103 mg/dL — AB (ref 65–99)
Glucose-Capillary: 108 mg/dL — ABNORMAL HIGH (ref 65–99)
Glucose-Capillary: 106 mg/dL — ABNORMAL HIGH (ref 65–99)

## 2017-03-29 LAB — LIPID PANEL
CHOLESTEROL: 235 mg/dL — AB (ref 0–200)
HDL: 68 mg/dL (ref >40)
LDL Cholesterol: 135 mg/dL — ABNORMAL HIGH (ref 0–99)
Total CHOL/HDL Ratio: 3.5 RATIO
Triglycerides: 162 mg/dL — ABNORMAL HIGH (ref <150)
VLDL: 32 mg/dL (ref 0–40)

## 2017-03-29 LAB — MRSA PCR SCREENING: MRSA BY PCR: NEGATIVE

## 2017-03-29 LAB — HEMOGLOBIN A1C
Hgb A1c MFr Bld: 6.6 % — ABNORMAL HIGH (ref 4.8–5.6)
MEAN PLASMA GLUCOSE: 142.72 mg/dL

## 2017-03-29 LAB — LIPASE, BLOOD: Lipase: 34 U/L (ref 11–51)

## 2017-03-29 MED ORDER — MORPHINE SULFATE ER 15 MG PO TBCR
15.0000 mg | EXTENDED_RELEASE_TABLET | Freq: Two times a day (BID) | ORAL | Status: DC
  Administered 2017-03-29 – 2017-04-03 (×10): 15 mg via ORAL
  Filled 2017-03-29 (×11): qty 1

## 2017-03-29 MED ORDER — POTASSIUM CHLORIDE IN NACL 20-0.9 MEQ/L-% IV SOLN
INTRAVENOUS | Status: AC
  Administered 2017-03-29 – 2017-03-30 (×3): via INTRAVENOUS
  Filled 2017-03-29 (×5): qty 1000

## 2017-03-29 MED ORDER — INSULIN ASPART 100 UNIT/ML ~~LOC~~ SOLN
0.0000 [IU] | Freq: Three times a day (TID) | SUBCUTANEOUS | Status: DC
  Administered 2017-04-01: 1 [IU] via SUBCUTANEOUS
  Administered 2017-04-01 – 2017-04-02 (×2): 2 [IU] via SUBCUTANEOUS
  Administered 2017-04-03: 1 [IU] via SUBCUTANEOUS
  Administered 2017-04-03: 3 [IU] via SUBCUTANEOUS

## 2017-03-29 MED ORDER — HYDROMORPHONE HCL 1 MG/ML IJ SOLN
0.5000 mg | INTRAMUSCULAR | Status: DC | PRN
  Administered 2017-03-29 – 2017-04-01 (×13): 0.5 mg via INTRAVENOUS
  Filled 2017-03-29 (×14): qty 1

## 2017-03-29 MED ORDER — PROMETHAZINE HCL 25 MG/ML IJ SOLN
6.2500 mg | Freq: Four times a day (QID) | INTRAMUSCULAR | Status: DC | PRN
  Administered 2017-03-30 – 2017-04-01 (×7): 6.25 mg via INTRAVENOUS
  Filled 2017-03-29 (×7): qty 1

## 2017-03-29 MED ORDER — PANCRELIPASE (LIP-PROT-AMYL) 12000-38000 UNITS PO CPEP
72000.0000 [IU] | ORAL_CAPSULE | Freq: Three times a day (TID) | ORAL | Status: DC
  Administered 2017-03-31 – 2017-04-03 (×9): 72000 [IU] via ORAL
  Filled 2017-03-29 (×12): qty 6

## 2017-03-29 MED ORDER — PROMETHAZINE HCL 25 MG PO TABS
12.5000 mg | ORAL_TABLET | Freq: Four times a day (QID) | ORAL | Status: DC | PRN
  Administered 2017-03-29 – 2017-04-03 (×7): 12.5 mg via ORAL
  Filled 2017-03-29 (×7): qty 1

## 2017-03-29 MED ORDER — ACETAMINOPHEN 650 MG RE SUPP
650.0000 mg | Freq: Four times a day (QID) | RECTAL | Status: DC | PRN
Start: 2017-03-29 — End: 2017-04-03

## 2017-03-29 MED ORDER — PROMETHAZINE HCL 25 MG RE SUPP: 12.5000 mg | Freq: Four times a day (QID) | RECTAL | Status: DC | PRN

## 2017-03-29 MED ORDER — TRAZODONE HCL 50 MG PO TABS
50.0000 mg | ORAL_TABLET | Freq: Every day | ORAL | Status: DC
  Filled 2017-03-29: qty 2

## 2017-03-29 MED ORDER — ACETAMINOPHEN 325 MG PO TABS
650.0000 mg | ORAL_TABLET | Freq: Four times a day (QID) | ORAL | Status: DC | PRN
  Administered 2017-03-29: 650 mg via ORAL
  Filled 2017-03-29: qty 2

## 2017-03-29 MED ORDER — ENOXAPARIN SODIUM 30 MG/0.3ML ~~LOC~~ SOLN
30.0000 mg | SUBCUTANEOUS | Status: DC
  Administered 2017-03-29 – 2017-04-02 (×5): 30 mg via SUBCUTANEOUS
  Filled 2017-03-29 (×5): qty 0.3

## 2017-03-29 NOTE — H&P (Signed)
Date: 03/29/2017               Patient Name:  [name] MRN: [id number]  DOB: [birth year] Age / Sex: 61 y.o., female   PCP: Neva Seat, MD         Medical Service: Internal Medicine Teaching Service         Attending Physician: Dr. Aldine Contes, MD    First Contact: Dr. Maricela Bo Pager: [phone]  Second Contact: Dr. Tiburcio Pea Pager: [phone]       After Hours (After 5p/  First Contact Pager: [phone]  weekends / holidays): Second Contact Pager: [phone]   Chief Complaint: Abdominal Pain  History of Present Illness: Ms. Case is a 61 y.o female with past medical history chronic pancreatitis, pancreatic divisum s/p ESRP, sphincterectomy 2014, stent placement 2015 and 2016 , essential hypertension, type 2 diabetes mellitus, beta thalassemia, iron deficiency anemia who presented with 5-day history of left upper quadrant tenderness.  The patient states that the abdominal pain is cramping and stabbing in nature, 25/10 in intensity, worsened with eating.  She states that she has accompanied nausea but has not vomited or had any diarrhea.  She states that she has been feeling full and gassy.  She has been forcing herself as she has been having a decreased appetite.  She took promethazine at 6 AM this morning 03/29/2017 she states that that helped her nausea. She states that she has been having a 20 pound weight loss that she attributes to her mother passing away.  She has not noticed any changes in her stool color and has been going to the bathroom the with the use of a laxative.  She was a direct admission from the internal medicine clinic.  Meds:  No outpatient medications have been marked as taking for the 03/29/17 encounter [facility] Encounter).     Allergies: Allergies as of 03/29/2017 - Review Complete 03/29/2017  Allergen Reaction Noted  . Butalbital-aspirin-caffeine Anaphylaxis 12/01/2014  . Fioricet [butalbital-apap-caffeine] Other (See Comments)   .  Shellfish-derived products Anaphylaxis 12/01/2014  . Shrimp flavor Anaphylaxis 08/25/2010  . Sulfonamide derivatives Rash 07/13/2006  . Aspirin  11/20/2012  . Sulfamethoxazole Rash 12/01/2014   Past Medical History:  Diagnosis Date  . Asthma    childhood asthma  . Beta thalassemia ([place])   . Blood dyscrasia    thalasemia, sickle cell trait  . Chronic abdominal pain    Unclear etiology, thought to be due to chronic pancreatitis previously in setting of her pancreatic divisum - however, EUS and EGD performed at [place]  (10/05/2011) showing normal esophageall, gastric, duodenal mucosa. EUS showing no pancreatic masses, cysts, or changes of chronic pancreatitis. Biliary system nondilated and had no endosonographic abnormalities.  . CVA (cerebral infarction) 1993   Per report by patient she had a stroke and prolonged rehab course eventually resolving her right sided weakness, as per her hx obtained 08/2007  . HLD (hyperlipidemia) 2009  . Hypertension   . Migraine headache    "q other year now" (11/20/2012)  . Pancreatic divisum    S/P ERCP with stenting 07/2010 at [facility], then stent removal (08/05/2010)  . Pneumonia ~ 1969  . Pulmonary embolus ([place]) 08/2004   Two areas of V/Q mismatch. Findings compatible  with high probability for pulmonary embolus.; pt was on coumadin for 1 year  . Seizures ([place])    "after the stroke in 1993; none for years now" 11/20/2012)  . Sickle cell trait ([place])   . Stroke (  [place]) 1993   very small amount of left sided weakness  . Thalassemia   . Type II diabetes mellitus ([place]) 2010   well controlled  . Uterine cancer ([place])     Family History:   Family History  Problem Relation Age of Onset  . Prostate cancer Father   . Cancer Father        stomach ca died x 1 year ago  . Sickle cell anemia Brother   . Lung cancer Maternal Aunt   . Lung cancer Maternal Uncle   . Breast cancer Maternal Grandmother   . Pancreatic cancer Maternal Grandmother   . Heart  disease Maternal Grandfather   . Heart disease Paternal Grandfather   . Diabetes Other   . Cancer Other        multiple cancers pancreas,colon, breast    Social History:   Social History   Socioeconomic History  . Marital status: Married    Spouse name: None  . Number of children: None  . Years of education: 36  . Highest education level: None  Social Needs  . Financial resource strain: None  . Food insecurity - worry: None  . Food insecurity - inability: None  . Transportation needs - medical: None  . Transportation needs - non-medical: None  Occupational History    Employer: UNEMPLOYED  Tobacco Use  . Smoking status: Former Smoker    Packs/day: 0.50    Years: 30.00    Pack years: 15.00    Types: Cigarettes    Last attempt to quit: 01/01/1999    Years since quitting: 18.2  . Smokeless tobacco: Never Used  Substance and Sexual Activity  . Alcohol use: No    Alcohol/week: 0.0 oz  . Drug use: No  . Sexual activity: Yes    Partners: Male  Other Topics Concern  . None  Social History Narrative   Worked at [employer] as a Librarian, academic, on Retail banker about 9 hours/ day   Review of Systems: A complete ROS was negative except as per HPI.   Physical Exam: There were no vitals taken for this visit.  Physical Exam  Constitutional: She appears well-developed and well-nourished.  HENT:  Head: Normocephalic and atraumatic.  Eyes: Conjunctivae are normal.  Cardiovascular: Normal rate, regular rhythm, normal heart sounds and intact distal pulses.  Pulmonary/Chest: Effort normal and breath sounds normal. No stridor. No respiratory distress.  Abdominal: Soft. Bowel sounds are normal. She exhibits no distension. There is tenderness (tenderness to palpation in epigastric and luq).  Neurological: She is alert.  Skin: She is not diaphoretic.  Psychiatric: She has a normal mood and affect. Her behavior is normal. Judgment and thought content normal.   CBC    Component Value  Date/Time   WBC 7.2 03/29/2017 1135   RBC 4.54 03/29/2017 1135   HGB 11.7 (L) 03/29/2017 1135   HGB 12.7 10/13/2006 1445   HCT 34.9 (L) 03/29/2017 1135   HCT 36.8 10/13/2006 1445   PLT 442 (H) 03/29/2017 1135   PLT 365 10/13/2006 1445   MCV 76.9 (L) 03/29/2017 1135   MCV 81.3 10/13/2006 1445   MCH 25.8 (L) 03/29/2017 1135   MCHC 33.5 03/29/2017 1135   RDW 14.4 03/29/2017 1135   RDW 14.5 10/13/2006 1445   LYMPHSABS 3.7 10/23/2013 1458   LYMPHSABS 3.5 (H) 10/13/2006 1445   MONOABS 0.6 10/23/2013 1458   MONOABS 0.5 10/13/2006 1445   EOSABS 0.1 10/23/2013 1458   EOSABS 0.1 10/13/2006 1445  BASOSABS 0.0 10/23/2013 1458   BASOSABS 0.3 (H) 10/13/2006 1445   BMP Latest Ref Rng & Units 03/29/2017 09/01/2016 08/31/2016  Glucose 65 - 99 mg/dL 111(H) 90 179(H)  BUN 6 - 20 mg/dL 10 6 11   Creatinine 0.44 - 1.00 mg/dL 0.67 0.72 0.78  Sodium 135 - 145 mmol/L 141 143 138  Potassium 3.5 - 5.1 mmol/L 3.8 4.0 3.4(L)  Chloride 101 - 111 mmol/L 102 111 101  CO2 22 - 32 mmol/L 30 25 28   Calcium 8.9 - 10.3 mg/dL 10.8(H) 8.2(L) 10.1   HbA1c=6.6 Amylase = 137, lipase = 34  Lipid Panel     Component Value Date/Time   CHOL 235 (H) 03/29/2017 1135   TRIG 162 (H) 03/29/2017 1135   HDL 68 03/29/2017 1135   CHOLHDL 3.5 03/29/2017 1135   VLDL 32 03/29/2017 1135   LDLCALC 135 (H) 03/29/2017 1135   CMP     Component Value Date/Time   NA 141 03/29/2017 1135   K 3.8 03/29/2017 1135   CL 102 03/29/2017 1135   CO2 30 03/29/2017 1135   GLUCOSE 111 (H) 03/29/2017 1135   BUN 10 03/29/2017 1135   CREATININE 0.67 03/29/2017 1135   CREATININE 0.72 07/31/2013 1644   CALCIUM 10.8 (H) 03/29/2017 1135   PROT 7.7 03/29/2017 1135   ALBUMIN 4.0 03/29/2017 1135   AST 18 03/29/2017 1135   ALT 9 (L) 03/29/2017 1135   ALKPHOS 48 03/29/2017 1135   BILITOT 0.3 03/29/2017 1135   GFRNONAA >60 03/29/2017 1135   GFRNONAA >89 07/31/2013 1644   GFRAA >60 03/29/2017 1135   GFRAA >89 07/31/2013 1644    Assessment  & Plan by Problem:  Chronic pancreatitis The patient's epigastric and left upper quadrant tenderness likely due to an inflammation of her chronic pancreatitis.  She has had a history of numerous hospitalizations for pancreatitis.  Patient had a CT abdomen done in [id number] showed pancreatic divisum.  The patient had a similar episode of left upper quadrant abdominal pain in May 2018 which was thought to be due to stool burden.  During this admission she was managed with Creon and bowel regimens.   -Placed NPO -Normal saline at 100 mL/h -Creon 72,000 units -Morphine 15 mg every 12 hours -Phenergan as needed  Hypertension The patient's blood pressure since admission has ranged 20-124/69-70.  The patient takes lisinopril-HCTZ 20-12.5 mg daily along with amlodipine 5 mg daily at home.  The patient is currently normotensive and will assess need for hypertensive medications tomorrow.  Diabetes mellitus type 2 The patient's A1c is 6.6. She is on home metformin 1000mg  bid.   -SSI   Dispo: Admit patient to Inpatient with expected length of stay greater than 2 midnights.  Signed: Lars Mage, MD Internal Medicine PGY1 Pager:[phone] 03/29/2017, 6:59 PM

## 2017-03-29 NOTE — Progress Notes (Signed)
Internal Medicine Clinic Attending  Case discussed with Dr. Rice at the time of the visit.  We reviewed the resident's history and exam and pertinent patient test results.  I agree with the assessment, diagnosis, and plan of care documented in the resident's note.  

## 2017-03-29 NOTE — Progress Notes (Signed)
Pt resting in bed quietly. Easily aroused. C/o back and abdominal pain and rates 6(10) on numerical scale. Scheduled morphine admniistered and effective. Pt educated on trazodone and refused states "I only needed it once when my mom passed away and I have not taken it since then." Pt educated that I would ask provider to change to prn instead of a scheduled nightly dose. Able to make needs known. Safety maintained. Will continue to monitor.

## 2017-03-29 NOTE — Progress Notes (Signed)
CC: Abdominal pain and nausea  HPI:  [name] is a 61 y.o. female with PMHx detailed below presenting with Abdominal pain and nausea since Sunday.  See problem based assessment and plan below for additional details.  Pancreatitis She has left upper quadrant abdominal pain with radiation to her back Worsening since Sunday. She also has severe nausea and has not kept down any solid food since Monday. She has morphine and promethazine at home which she takes for symptoms of her chronic abdominal pain. These have been minimally effective in treating her current symptoms. She denies vomiting. She has no fevers or chills. She is passing loose stools but denies diarrhea. Her chronic abdominal pain has never been definitively diagnosed. This has been previously felt to be due to chronic pancreatitis and she is on creon supplementation since hospitalization earlier this year in May. However, past stenting and sphincterotomy have not improved her symptoms as would be expected for divisum. EUS has never shown structural changes of pancreatitis. She has also been grieving lately after her mother passed away in 2023-03-11 and she attributes her recent weight loss since August (8 lbs per chart) to poor lifestyle and diet since that time. Assessment Left upper quadrant abdominal pain, possible flare of chronic pancreatitis She would benefit from admission for IV fluids and medication to control her pain and nausea at this time, symptoms are not adequately controlled on oral meds alone. There is no evidence of acute peritonitis Plan Lipase, amylase, lipid profile, CBC, CMP Admit to hospital for pain control, IV fluids    Past Medical History:  Diagnosis Date  . Beta thalassemia ([place])   . Blood dyscrasia    thalasemia, sickle cell trait  . Childhood asthma   . Chronic abdominal pain    Unclear etiology, thought to be due to chronic pancreatitis previously in setting of her pancreatic  divisum - however, EUS and EGD performed at [place]  (10/05/2011) showing normal esophageall, gastric, duodenal mucosa. EUS showing no pancreatic masses, cysts, or changes of chronic pancreatitis. Biliary system nondilated and had no endosonographic abnormalities.  . CVA (cerebral vascular accident) ([place]) 1993   Per report by patient she had a stroke and prolonged rehab course; still "weak on left side but not much" (03/29/2017)  . Family history of adverse reaction to anesthesia    "mom took a long while to wake up; she was allergic to it" (03/29/2017)  . History of blood transfusion 1980s   "related to minor sickle cell crisis" (03/29/2017)  . HLD (hyperlipidemia) 2009  . Hypertension   . Migraine headache    "q couple years now" (03/29/2017)  . Pancreatic divisum    S/P ERCP with stenting 07/2010 at [facility], then stent removal (08/05/2010)  . Pneumonia    "had it in my teens 3 times" (03/29/2017)  . Pulmonary embolus ([place]) 08/2004   Two areas of V/Q mismatch. Findings compatible  with high probability for pulmonary embolus.; pt was on coumadin for 1 year  . Seizures ([place]) 1993   "after the stroke in 1993; none for years now"  (03/29/2017)  . Sickle cell trait ([place])   . Thalassemia   . Type II diabetes mellitus ([place]) 2010   well controlled  . Uterine cancer ([place])     Review of Systems: Review of Systems  Constitutional: Negative for chills and fever.  Cardiovascular: Negative for chest pain.  Gastrointestinal: Positive for abdominal pain, diarrhea and nausea. Negative for blood in stool.  Genitourinary: Negative  for flank pain.  Musculoskeletal: Positive for back pain.  Skin: Negative for rash.  Psychiatric/Behavioral: Positive for depression.     Physical Exam: Vitals:   03/29/17 1036 03/29/17 1514  BP: 120/73 123/65  Pulse: 86 68  Temp: 98.2 F (36.8 C)   TempSrc: Oral   SpO2: 99%   Weight: 100 lb 4.8 oz (45.5 kg)    GENERAL- Uncomfortable appearing patient  lying on side with knees flexed HEENT- Oral mucosa appears moist CARDIAC- RRR, no murmurs, rubs or gallops. RESP- CTAB, no wheezes or crackles. ABDOMEN- Tenderness to palpation over the LUQ, hyperactive bowel sounds present in lower abdomen BACK- Left flank and CVA nontender EXTREMITIES- pulse 2+, symmetric, no pedal edema. SKIN- Warm, dry, No rash or lesion. PSYCH- Tearful affect, very uncomfortable, oriented and alert   Assessment & Plan:   See encounters tab for problem based medical decision making.  Patient discussed with Dr. Angelia Mould

## 2017-03-29 NOTE — Progress Notes (Signed)
[name] is a 61 y.o. female patient admitted from ED awake, alert - oriented  X 4 - no acute distress noted.  VSS - Blood pressure 124/69, pulse 70, temperature 98.6 F (37 C), temperature source Oral, resp. rate 16, SpO2 100 %.    IV in place, occlusive dsg intact without redness.  Orientation to room, and floor completed with information packet given to patient/family.  Patient declined safety video at this time.  Admission INP armband ID verified with patient/family, and in place.   SR up x 2, fall assessment complete, with patient and family able to verbalize understanding of risk associated with falls, and verbalized understanding to call nsg before up out of bed.  Call light within reach, patient able to voice, and demonstrate understanding.  Skin, clean-dry- intact without evidence of bruising, or skin tears. Small blood blister on finger on right hand.  No evidence of skin break down noted on exam.    Will cont to eval and treat per MD orders.  Celine Ahr, RN 03/29/2017 4:58 PM

## 2017-03-29 NOTE — Assessment & Plan Note (Addendum)
She has left upper quadrant abdominal pain with radiation to her back Worsening since Sunday. She also has severe nausea and has not kept down any solid food since Monday. She has morphine and promethazine at home which she takes for symptoms of her chronic abdominal pain. These have been minimally effective in treating her current symptoms. She denies vomiting. She has no fevers or chills. She is passing loose stools but denies diarrhea. Her chronic abdominal pain has never been definitively diagnosed. This has been previously felt to be due to chronic pancreatitis and she is on creon supplementation since hospitalization earlier this year in May. However, past stenting and sphincterotomy have not improved her symptoms as would be expected for divisum. EUS has never shown structural changes of pancreatitis. She has also been grieving lately after her mother passed away in 03/23/2023 and she attributes her recent weight loss since August (8 lbs per chart) to poor lifestyle and diet since that time. Assessment Left upper quadrant abdominal pain, possible flare of chronic pancreatitis She would benefit from admission for IV fluids and medication to control her pain and nausea at this time, symptoms are not adequately controlled on oral meds alone. There is no evidence of acute peritonitis Plan Lipase, amylase, lipid profile, CBC, CMP Admit to hospital for pain control, IV fluids

## 2017-03-30 ENCOUNTER — Inpatient Hospital Stay (HOSPITAL_COMMUNITY): Payer: Medicare HMO

## 2017-03-30 ENCOUNTER — Other Ambulatory Visit: Payer: Self-pay | Admitting: Pharmacist

## 2017-03-30 ENCOUNTER — Encounter (HOSPITAL_COMMUNITY): Payer: Self-pay | Admitting: Radiology

## 2017-03-30 DIAGNOSIS — E119 Type 2 diabetes mellitus without complications: Secondary | ICD-10-CM

## 2017-03-30 DIAGNOSIS — K861 Other chronic pancreatitis: Secondary | ICD-10-CM

## 2017-03-30 DIAGNOSIS — K859 Acute pancreatitis without necrosis or infection, unspecified: Secondary | ICD-10-CM

## 2017-03-30 LAB — GLUCOSE, CAPILLARY
GLUCOSE-CAPILLARY: 92 mg/dL (ref 65–99)
GLUCOSE-CAPILLARY: 89 mg/dL (ref 65–99)
GLUCOSE-CAPILLARY: 68 mg/dL (ref 65–99)
Glucose-Capillary: 102 mg/dL — ABNORMAL HIGH (ref 65–99)
Glucose-Capillary: 154 mg/dL — ABNORMAL HIGH (ref 65–99)
Glucose-Capillary: 79 mg/dL (ref 65–99)

## 2017-03-30 LAB — COMPREHENSIVE METABOLIC PANEL
ALBUMIN: 3.2 g/dL — AB (ref 3.5–5.0)
ALT: 8 U/L — ABNORMAL LOW (ref 14–54)
ANION GAP: 5 (ref 5–15)
AST: 14 U/L — ABNORMAL LOW (ref 15–41)
Alkaline Phosphatase: 42 U/L (ref 38–126)
BUN: 6 mg/dL (ref 6–20)
CHLORIDE: 108 mmol/L (ref 101–111)
CO2: 27 mmol/L (ref 22–32)
Calcium: 9.1 mg/dL (ref 8.9–10.3)
Creatinine, Ser: 0.61 mg/dL (ref 0.44–1.00)
GFR calc Af Amer: >60 mL/min (ref >60)
GFR calc non Af Amer: >60 mL/min (ref >60)
GLUCOSE: 110 mg/dL — AB (ref 65–99)
POTASSIUM: 3.8 mmol/L (ref 3.5–5.1)
SODIUM: 140 mmol/L (ref 135–145)
Total Bilirubin: 0.3 mg/dL (ref 0.3–1.2)
Total Protein: 5.9 g/dL — ABNORMAL LOW (ref 6.5–8.1)

## 2017-03-30 LAB — CBC
HCT: 30.6 % — ABNORMAL LOW (ref 36.0–46.0)
Hemoglobin: 10.1 g/dL — ABNORMAL LOW (ref 12.0–15.0)
MCH: 25.4 pg — ABNORMAL LOW (ref 26.0–34.0)
MCHC: 33 g/dL (ref 30.0–36.0)
MCV: 77.1 fL — ABNORMAL LOW (ref 78.0–100.0)
PLATELETS: 390 10*3/uL (ref 150–400)
RBC: 3.97 MIL/uL (ref 3.87–5.11)
RDW: 14.3 % (ref 11.5–15.5)
WBC: 5.7 10*3/uL (ref 4.0–10.5)

## 2017-03-30 MED ORDER — TRAZODONE HCL 50 MG PO TABS: 50.0000 mg | ORAL_TABLET | Freq: Every evening | ORAL | Status: DC | PRN

## 2017-03-30 MED ORDER — PANCRELIPASE (LIP-PROT-AMYL) 36000-114000 UNITS PO CPEP: ORAL_CAPSULE | ORAL | 0 refills | Status: DC

## 2017-03-30 MED ORDER — POTASSIUM CHLORIDE IN NACL 20-0.9 MEQ/L-% IV SOLN
INTRAVENOUS | Status: AC
  Administered 2017-03-30 – 2017-03-31 (×2): via INTRAVENOUS
  Filled 2017-03-30 (×2): qty 1000

## 2017-03-30 MED ORDER — IOPAMIDOL (ISOVUE-300) INJECTION 61%
INTRAVENOUS | Status: AC
  Filled 2017-03-30: qty 30

## 2017-03-30 MED ORDER — IOPAMIDOL (ISOVUE-300) INJECTION 61%
INTRAVENOUS | Status: AC
  Administered 2017-03-30: 100 mL
  Filled 2017-03-30: qty 100

## 2017-03-30 MED ORDER — DEXTROSE 50 % IV SOLN
INTRAVENOUS | Status: AC
  Administered 2017-03-30: 25 mL
  Filled 2017-03-30: qty 50

## 2017-03-30 NOTE — Progress Notes (Signed)
Consult from Dr. Tiburcio Pea for Creon access for patient. Will continue to work with her post-discharge.

## 2017-03-30 NOTE — Progress Notes (Signed)
Hypoglycemic Event  CBG: 68  Treatment: D50 IV 25 mL   Symptoms: None  Follow-up CBG: Time:1710 CBG Result:154  Possible Reasons for Event: Other: Pt is NPO.   Comments/MD notified: MD made aware    [name]

## 2017-03-30 NOTE — Progress Notes (Signed)
Prayed with patient per her request and listened to her concerns. Supported patient emotionally.  Will follow as needed. Jaclynn Major, [place], [facility], Pager [phone]

## 2017-03-30 NOTE — Progress Notes (Signed)
Initial Nutrition Assessment  DOCUMENTATION CODES:   Not applicable  INTERVENTION:  1. Recommend Ensure Enlive with diet advancement  NUTRITION DIAGNOSIS:   Unintentional weight loss related to acute illness as evidenced by per patient/family report, percent weight loss.  GOAL:   Patient will meet greater than or equal to 90% of their needs  MONITOR:   PO intake, I & O's, Labs, Supplement acceptance, Weight trends  REASON FOR ASSESSMENT:   Malnutrition Screening Tool    ASSESSMENT:   Ms. Grenier has a PMH Chronic Pancreatitis, Pancreatic divisum s/p ERCP, sphincterectomy 2014, multiple stents, T2DM, beta thalassemia, iron deficiency anemia, presents with 5 days of LUQ tenderness, crampy/stabbing ab pain, worsened with eating. Some nausea. Forcing herself to eat. Recent 20 pound weight loss related to mother passing away.   Spoke with patient at bedside. She reports she had a 20 pound/16.7% severe weight loss over 1 month. Apparently she was not taking her Creon during that time in addition to her mother passing away. Reports that as her pancreatitis flared up she began to experience early satiety and indigestion as well. Normally grazes on food, constantly eating throughout the day. Normally eats an orange, fruit, cereal, bagels, and bacon for breakfast. Will eat a sandwich, cookies, and chips for lunch and a cooked meal with vegetable, starch and meat for dinner. Currently NPO. Feels better regarding abd pain and nausea/vomiting.  Labs reviewed:  CBGs 102, 92, 103  Medications reviewed and include:  Insulin, Creon, Morphine NS 20K+ at 157mL/hr   NUTRITION - FOCUSED PHYSICAL EXAM:     Most Recent Value  Orbital Region  No depletion  Upper Arm Region  No depletion  Thoracic and Lumbar Region  No depletion  Buccal Region  No depletion  Temple Region  No depletion  Clavicle Bone Region  No depletion  Clavicle and Acromion Bone Region  No depletion  Scapular  Bone Region  No depletion  Dorsal Hand  No depletion  Patellar Region  No depletion  Anterior Thigh Region  No depletion  Posterior Calf Region  No depletion  Edema (RD Assessment)  None  Hair  Reviewed  Eyes  Reviewed  Mouth  Reviewed  Skin  Reviewed  Nails  Reviewed       Diet Order:  Diet NPO time specified Except for: Sips with Meds, Ice Chips  EDUCATION NEEDS:   Education needs have been addressed  Skin:  Skin Assessment: Reviewed RN Assessment  Last BM:  03/29/2017  Height:   Ht Readings from Last 1 Encounters:  02/24/17 5' (1.524 m)    Weight:   Wt Readings from Last 1 Encounters:  03/30/17 97 lb 3.2 oz (44.1 kg)    Ideal Body Weight:  45.45 kg  BMI:  Body mass index is 18.98 kg/m.  Estimated Nutritional Needs:   Kcal:  1300-1500 calories  Protein:  66-75 grams  Fluid:  >1.5L  Satira Anis. Kiondra Caicedo, MS, RD LDN Inpatient Clinical Dietitian Pager [phone]

## 2017-03-30 NOTE — Progress Notes (Signed)
Pt with continued c/o of abdominal pain dilaudid administered at 0448 and effective. Then with c/o of nausea without any vomiting. Phenergan 6.25mg  IV administered and effective. Pt with ice chips this shift per order. callbell within reach. Will continue to monitor

## 2017-03-30 NOTE — Progress Notes (Signed)
   Subjective: [name] was seen laying in her bed this morning. She stated that she continues to have abdominal pain, but is somewhat relieved in comparison to yesterday. She has used 3 doses of dilaudid and one dose of morphine today.   Objective:  Vital signs in last 24 hours: Vitals:   03/29/17 1622 03/29/17 2155 03/30/17 0525 03/30/17 1346  BP: 124/69 108/61 (!) 104/58 110/64  Pulse: 70 (!) 59 65 66  Resp: 16 18 18 18   Temp: 98.6 F (37 C) 98.2 F (36.8 C) 97.8 F (36.6 C) 97.8 F (36.6 C)  TempSrc: Oral Oral Oral   SpO2: 100% 98% 97% 99%  Weight:   97 lb 3.2 oz (44.1 kg)    Physical Exam  Constitutional: She appears well-developed and well-nourished. No distress.  HENT:  Head: Normocephalic and atraumatic.  Eyes: Conjunctivae are normal.  Cardiovascular: Normal rate, regular rhythm, normal heart sounds and intact distal pulses.  Respiratory: Effort normal and breath sounds normal. No respiratory distress. She has no wheezes.  GI: Soft. Bowel sounds are normal. She exhibits no distension. There is tenderness (luq tenderness with radiation to the back).  Musculoskeletal: Normal range of motion.  Skin: She is not diaphoretic.  Psychiatric: She has a normal mood and affect. Her behavior is normal. Judgment and thought content normal.    Assessment/Plan:  Acute on Chronic pancreatitis The patient's epigastric and left upper quadrant tenderness, nausea, elevated amylase were all thought to be indicative of acute on chronic pancreatitis. However, the patients fecal elastase which was checked in May 2018 was in normal range. We will order CT abdomen with contrast to further identify the cause of the patient's abdominal pain.   -Continue NPO except for contrast -Continue Normal saline with KCL 26meq.L at 100 mL/h -Continue Creon 72,000 units and Dr. Maudie Mercury has arranged for the patient to get creon for 50 days free of cost and subsequently at an affordable price -Morphine 15 mg  every 12 hours -Phenergan as needed  Hypertension The patient's blood pressure since admission has ranged 104-124/58-69.   -holding home blood pressure medication as the patient is normotensive -will need to consider discontinuing hctz if the patient is definitely diagnosed with pancreatitis  Diabetes mellitus type 2 The patient's blood glucose over the past 24 hrs has been ranging 68-154.  The patient is NPO and may need D50 if hypoglycemic  -continue SSI   Dispo: Anticipated discharge in approximately 0-1 day.   Eileen Mage, MD Internal Medicine PGY1 Pager:[phone] 03/30/2017, 4:47 PM

## 2017-03-31 DIAGNOSIS — R10812 Left upper quadrant abdominal tenderness: Secondary | ICD-10-CM

## 2017-03-31 DIAGNOSIS — K8689 Other specified diseases of pancreas: Principal | ICD-10-CM

## 2017-03-31 LAB — GLUCOSE, CAPILLARY
GLUCOSE-CAPILLARY: 68 mg/dL (ref 65–99)
GLUCOSE-CAPILLARY: 161 mg/dL — AB (ref 65–99)
GLUCOSE-CAPILLARY: 64 mg/dL — AB (ref 65–99)
GLUCOSE-CAPILLARY: 94 mg/dL (ref 65–99)
Glucose-Capillary: 97 mg/dL (ref 65–99)
Glucose-Capillary: 134 mg/dL — ABNORMAL HIGH (ref 65–99)

## 2017-03-31 LAB — BASIC METABOLIC PANEL
Anion gap: 6 (ref 5–15)
CHLORIDE: 107 mmol/L (ref 101–111)
CO2: 25 mmol/L (ref 22–32)
CREATININE: 0.68 mg/dL (ref 0.44–1.00)
Calcium: 9.1 mg/dL (ref 8.9–10.3)
GFR calc non Af Amer: >60 mL/min (ref >60)
Glucose, Bld: 77 mg/dL (ref 65–99)
POTASSIUM: 4.2 mmol/L (ref 3.5–5.1)
SODIUM: 138 mmol/L (ref 135–145)

## 2017-03-31 MED ORDER — PANCRELIPASE (LIP-PROT-AMYL) 36000-114000 UNITS PO CPEP: ORAL_CAPSULE | ORAL | 0 refills | Status: DC

## 2017-03-31 MED ORDER — DEXTROSE 50 % IV SOLN
INTRAVENOUS | Status: AC
  Filled 2017-03-31: qty 50

## 2017-03-31 MED ORDER — DEXTROSE 50 % IV SOLN
INTRAVENOUS | Status: AC
  Administered 2017-03-31: 25 mL
  Filled 2017-03-31: qty 50

## 2017-03-31 NOTE — Progress Notes (Signed)
Hypoglycemic Event  CBG: 68  Treatment: D50 IV 25 mL  Symptoms: None  Follow-up CBG: [id number] CBG Result:161  Possible Reasons for Event: Inadequate meal intake  Comments/MD notified:yes    Eliot Ford

## 2017-03-31 NOTE — Addendum Note (Signed)
Addended by: Forde Dandy on: 03/31/2017 10:51 AM   Modules accepted: Orders

## 2017-03-31 NOTE — Progress Notes (Signed)
   Subjective: [name] was seen laying in her bed this morning. She stated that she still feels like she has abdominal pain. She has not been having any nausea or vomiting. She is anxious as to whether she can tolerate food.   Objective:  Vital signs in last 24 hours: Vitals:   03/30/17 2040 03/30/17 2109 03/31/17 0319 03/31/17 0501  BP: (!) 148/73 128/71  102/62  Pulse:  76  64  Resp:  18  18  Temp:  (!) 97.4 F (36.3 C)  99 F (37.2 C)  TempSrc:  Oral  Oral  SpO2:  98%  97%  Weight:    102 lb 15.3 oz (46.7 kg)  Height:   5' (1.524 m)    Physical Exam  Constitutional: She appears well-developed and well-nourished. No distress.  HENT:  Head: Normocephalic and atraumatic.  Eyes: Conjunctivae are normal.  Cardiovascular: Normal rate, regular rhythm and normal heart sounds.  GI: Soft. Bowel sounds are normal. She exhibits no distension. There is no tenderness.  Skin: She is not diaphoretic.  Psychiatric: She has a normal mood and affect. Her behavior is normal. Judgment and thought content normal.    Assessment/Plan:  LUQ tenderness Pancreatitis was evaluated yesterday 03/30/17 with CT abdomen which showed no evidence of pancreatitis, no explanation for the left upper quadrant pain.  It is possible that there is no structural abnormality in the pancreas mild pancreatic insufficiency.  The patient has had numerous hospitalization for this  because, however there is speculation about  these complaints being truly pancreatic in origin.  The patient's elastase done on 09/02/2016 was 234 which is in normal range.  Colonoscopy was done 05/13/2011 which showed normal mucosa throughout the colon.  -Patient's diet was changed from n.p.o. to full liquid -Continue Normal saline with KCL 84meq.L at 100 mL/h -Continue Creon 72,000 units and Dr. Maudie Mercury has arranged for the patient to get creon for 50 days free of cost and subsequently at an affordable price -Morphine 15 mg every 12  hours -Phenergan as needed  Hypertension The patient's blood pressure since admission has ranged 102-148/62-73  -holding home blood pressure medication as the patient is normotensive  Diabetes mellitus type 2 The patient's blood glucose over the past 24 hrs has been ranging 64-161.  -continue SSI   Dispo: Anticipated discharge in approximately 0-1 day.   Lars Mage, MD Internal Medicine PGY1 Pager:[phone] 03/31/2017, 12:14 PM

## 2017-04-01 DIAGNOSIS — G8929 Other chronic pain: Secondary | ICD-10-CM

## 2017-04-01 DIAGNOSIS — I10 Essential (primary) hypertension: Secondary | ICD-10-CM

## 2017-04-01 DIAGNOSIS — R109 Unspecified abdominal pain: Secondary | ICD-10-CM

## 2017-04-01 LAB — GLUCOSE, CAPILLARY
GLUCOSE-CAPILLARY: 96 mg/dL (ref 65–99)
GLUCOSE-CAPILLARY: 149 mg/dL — AB (ref 65–99)
Glucose-Capillary: 168 mg/dL — ABNORMAL HIGH (ref 65–99)
Glucose-Capillary: 153 mg/dL — ABNORMAL HIGH (ref 65–99)

## 2017-04-01 MED ORDER — ENSURE ENLIVE PO LIQD
237.0000 mL | Freq: Two times a day (BID) | ORAL | Status: DC
  Administered 2017-04-01 – 2017-04-03 (×5): 237 mL via ORAL

## 2017-04-01 MED ORDER — HYDROMORPHONE HCL 1 MG/ML IJ SOLN
0.5000 mg | INTRAMUSCULAR | Status: DC | PRN
  Administered 2017-04-01 – 2017-04-02 (×6): 0.5 mg via INTRAVENOUS
  Filled 2017-04-01 (×6): qty 1

## 2017-04-01 NOTE — Progress Notes (Signed)
   Subjective:  Patient states that she ate only a little last night and had associated nausea with some worsening of her abd pain. She will attempt eating again this morning; she is hopeful she will be able to tolerate as she wants to go home. She denies other new symptoms.   Objective:  Vital signs in last 24 hours: Vitals:   03/31/17 1420 03/31/17 2104 04/01/17 0334 04/01/17 0545  BP: 94/80 (!) 145/69  107/68  Pulse: 66 72  61  Resp: 18 16  16   Temp: 99.1 F (37.3 C) 98.1 F (36.7 C)  98 F (36.7 C)  TempSrc: Oral Oral  Oral  SpO2: 98% 100%  98%  Weight:   102 lb 4.8 oz (46.4 kg)   Height:       Physical Exam  Constitutional: She appears well-developed. No distress.  HENT:  Head: Normocephalic and atraumatic.  Eyes: Conjunctivae are normal.  Cardiovascular: Normal rate, regular rhythm and normal heart sounds.  GI: Soft. Bowel sounds are normal. She exhibits no distension. There is tenderness (LUQ).  Skin: Skin is warm and dry. She is not diaphoretic.   Assessment/Plan:  LUQ tenderness Possibility of 03/30/17 with CT abdomen which showed no evidence of pancreatitis, no explanation for the left upper quadrant pain. It is possible that there is no structural abnormality in the pancreas mild pancreatic insufficiency. The patient has had numerous hospitalization for this  because, however there is speculation about these complaints being truly pancreatic in origin. The patient's elastase done on 09/02/2016 was 234 which is in normal range.  Colonoscopy was done 05/13/2011 which showed normal mucosa throughout the colon. -Full liquid diet -Continue Creon 72,000 units and Dr. Maudie Mercury has arranged for the patient to get creon for 50 days free of cost and subsequently at an affordable price -Morphine 15 mg every 12 hours -Phenergan as needed -will re-eval early this afternoon if tolerated PO intake for possible discharge later today  Hypertension Normotensive during this admission, so  home meds have been held.  Diabetes mellitus type 2 The patient's blood glucose over the past 24 hrs has been ranging 64-161. -continue SSI   Dispo: Anticipated discharge in approximately 0 -1 day.   Alphonzo Grieve, MD IMTS - PGY2 04/01/2017, 11:50 AM

## 2017-04-01 NOTE — Progress Notes (Signed)
  Date: 04/01/2017  Patient name: [name]  Medical record number: [id number]  Date of birth: July 13, 1955   I have seen and evaluated this patient and I have discussed the plan of care with the house staff. Please see Dr. Winfred Burn note for complete details. I concur with her findings.    Sid Falcon, MD 04/01/2017, 12:39 PM

## 2017-04-02 LAB — GLUCOSE, CAPILLARY
GLUCOSE-CAPILLARY: 190 mg/dL — AB (ref 65–99)
Glucose-Capillary: 107 mg/dL — ABNORMAL HIGH (ref 65–99)
Glucose-Capillary: 183 mg/dL — ABNORMAL HIGH (ref 65–99)
Glucose-Capillary: 109 mg/dL — ABNORMAL HIGH (ref 65–99)

## 2017-04-02 MED ORDER — HYDROMORPHONE HCL 1 MG/ML IJ SOLN
0.2500 mg | Freq: Four times a day (QID) | INTRAMUSCULAR | Status: DC | PRN
Start: 2017-04-02 — End: 2017-04-03
  Administered 2017-04-02 – 2017-04-03 (×4): 0.25 mg via INTRAVENOUS
  Filled 2017-04-02 (×4): qty 1

## 2017-04-02 NOTE — Progress Notes (Signed)
Subjective: [name] was seen laying in her bed this morning. She states that she continues to have some abdominal pain, but denies any nausea or vomiting. She states that she has not been able to tolerate foods yesterday (grits) and she has therefore just been drinking liquids and ensure.   Objective:  Vital signs in last 24 hours: Vitals:   04/01/17 0545 04/01/17 1312 04/01/17 2108 04/02/17 0552  BP: 107/68 125/62 (!) 103/59 127/83  Pulse: 61 63 79 66  Resp: 16 17 16 12   Temp: 98 F (36.7 C) 98.2 F (36.8 C) 98 F (36.7 C) 97.8 F (36.6 C)  TempSrc: Oral Oral Oral Oral  SpO2: 98% 99% 99% 99%  Weight:    102 lb 14.4 oz (46.7 kg)  Height:       Physical Exam  Constitutional: She appears well-developed and well-nourished. No distress.  HENT:  Head: Normocephalic and atraumatic.  Eyes: Conjunctivae are normal.  Cardiovascular: Normal rate, regular rhythm and normal heart sounds.  Pulmonary/Chest: Effort normal and breath sounds normal. No stridor. No respiratory distress.  Abdominal: Soft. Bowel sounds are normal. She exhibits no distension. There is tenderness (left upper quadrant to palpation).  Neurological: She is alert.  Skin: She is not diaphoretic.  Psychiatric: She has a normal mood and affect. Her behavior is normal. Judgment and thought content normal.    Assessment/Plan:  Chronic Abdominal pain  Pancreatic divisum s/p minor papillotomy with stent placement/removal ?Chronic Pancreatitis  Cause of patient's current abdominal pain is thought to be due to exacerbation of pancreatic insufficiency. The patient does not have evidence of pancreatitis as seen on CT abdomen (03/30/17). The patient states that she currently still has luq tenderness to palpation even with mild touch. The patient follows with [facility] in [facility] where sher receives morphine 15mg  q12hrs and buprenorphine 958mcg film q12hrs. The patient has continued to receive morphine  15mg  q12 hrs here in the hospital along with dilaudid 0.5mg  q4hrs prn. The patient took 3 doses of the dilaudid yesterday 04/01/17 and 2 doses this morning (04/02/17).   Further chart review revealed that the patient has had at least one flare yearly:01/2013, 09/2013,08/2016  EGD and EUS in 6/13 found pancreatic divisum and no evidence of chronic pancreatits. She has had bilateral celiac plexus block via splanchnic nerveapproach at [facility] in 10/10/16 Patient has family history of colon and gastric cancer.   The patient symptoms are not consistent with VIPoma as she does not have any hypokalemia.  Celiac disease is unlikely as the patient is negative for endomysial IgA and tissue transglutaminate IGA in 2008.   A large number of the hospitalizations seem to be when the patient stops taking her Creon so it is possible that the patient has mild pancreatic insufficiency without chronic pancreatitis. The patient symptoms may also be due to IBS or somatization disorder as there is no clear etiology for the patient's symptoms. Can consider starting the patient on a SSRI.   -The patient is currently on a full liquid diet  -ContinueCreon 72,000 unitsand Dr. Maudie Mercury has arranged for the patient to get creon for 50 days free of cost and subsequently at an affordable price -Morphine 15 mg every 12 hours and dilaudid 0.5mg  q4hrs prn -continue trazodone 50-100mg  qhs prn -Phenergan as needed  -Daily weights: the patient was 100lbs on admission and is 102lbs today  Essential Hypertension The patient's blood pressure since admission has ranged102-127/59-83  -holding home blood pressure medication as the  patient is normotensive  Diabetes mellitus type 2 The patient's blood glucose over the past 24 hrs has been ranging 96-168.  -continue SSI  Dispo: Anticipated discharge in approximately 0-1 day.   Lars Mage, MD Internal Medicine PGY1 Pager:[phone] 04/02/2017, 8:42 AM

## 2017-04-02 NOTE — Progress Notes (Signed)
  Date: 04/02/2017  Patient name: [name]  Medical record number: [id number]  Date of birth: 10-20-1955   This patient's plan of care was discussed with the house staff. Please see Dr. Thea Gist note for complete details. I concur with her findings.   Sid Falcon, MD 04/02/2017, 1:01 PM

## 2017-04-03 ENCOUNTER — Encounter (HOSPITAL_COMMUNITY): Payer: Self-pay | Admitting: Internal Medicine

## 2017-04-03 DIAGNOSIS — Z886 Allergy status to analgesic agent status: Secondary | ICD-10-CM

## 2017-04-03 DIAGNOSIS — Z91013 Allergy to seafood: Secondary | ICD-10-CM

## 2017-04-03 DIAGNOSIS — Z882 Allergy status to sulfonamides status: Secondary | ICD-10-CM

## 2017-04-03 DIAGNOSIS — R1013 Epigastric pain: Secondary | ICD-10-CM

## 2017-04-03 DIAGNOSIS — Z888 Allergy status to other drugs, medicaments and biological substances status: Secondary | ICD-10-CM

## 2017-04-03 LAB — GLUCOSE, CAPILLARY
GLUCOSE-CAPILLARY: 213 mg/dL — AB (ref 65–99)
Glucose-Capillary: 192 mg/dL — ABNORMAL HIGH (ref 65–99)
Glucose-Capillary: 129 mg/dL — ABNORMAL HIGH (ref 65–99)

## 2017-04-03 MED ORDER — PROMETHAZINE HCL 12.5 MG PO TABS: 12.5000 mg | ORAL_TABLET | Freq: Three times a day (TID) | ORAL | 0 refills | Status: DC | PRN

## 2017-04-03 NOTE — Discharge Instructions (Signed)
It was a pleasure to see you [name]. Please make sure to pick up your creon and take it daily. Please stop taking your blood pressure medications till your follow up appointment. Please resume your diet slowly.

## 2017-04-03 NOTE — Care Management Important Message (Signed)
Important Message  Patient Details  Name: [name] MRN: [id number] Date of Birth: 27-Sep-1955   Medicare Important Message Given:  Yes    [name] May 04/03/2017, 10:55 AM

## 2017-04-03 NOTE — Progress Notes (Signed)
   Subjective: [name] stated that she was feeling much better today and expressed that she would like to go home. She has been able to tolerate fluids well and is slowly trying to resume her diet as well. She denied any nausea or vomiting. The patient has used 2 doses of dilaudid today.  Objective:  Vital signs in last 24 hours: Vitals:   04/02/17 1400 04/02/17 2116 04/03/17 0632 04/03/17 0634  BP: 108/60 124/62 117/63   Pulse: 63 82 63   Resp: 15 17 18    Temp: 98.2 F (36.8 C) 97.8 F (36.6 C) 98.2 F (36.8 C)   TempSrc: Oral Oral Oral   SpO2: 98% 98% 98%   Weight:    107 lb 12.9 oz (48.9 kg)  Height:       Physical Exam  Constitutional: She appears well-developed and well-nourished. No distress.  HENT:  Head: Normocephalic and atraumatic.  Eyes: Conjunctivae are normal.  Cardiovascular: Normal rate, regular rhythm, normal heart sounds and intact distal pulses.  Respiratory: Effort normal and breath sounds normal. No respiratory distress. She has no wheezes.  GI: Soft. Bowel sounds are normal. She exhibits no distension. There is tenderness (mild amount in luq and epigastric area).  Neurological: She is alert.  Skin: She is not diaphoretic.  Psychiatric: She has a normal mood and affect. Her behavior is normal. Judgment and thought content normal.   Assessment/Plan:  Chronic Abdominal pain  Pancreatic divisum s/p minor papillotomy with stent placement/removal ?Chronic Pancreatitis  The patient states that her abdominal pain has been alleviated somewhat. She has been able to tolerate fluids and feels comfortable for discharge.   -Discharge today with recommendation to slowly resume diet -Creon has been sent to pharmacy and Dr. Maudie Mercury has been notified to help her get supply of creon at outpatient clinic appointment.  -Continue morphine 15 mg every 12 hours and dilaudid 0.5mg  q4hrs prn -Phenergan as needed  -Daily weights: the patient was 100lbs on admission and is 107lbs  today 04/03/17  Essential Hypertension The patient's blood pressure since admission has ranged108-124/60-62  -holding home blood pressure medication as the patient is normotensive  Diabetes mellitus type 2 The patient's blood glucose over the past 24 hrs has been ranging 107-213.  -continue SSIin hospital -the patient was recommended to continue home metformin at discharge and consider drinking glucerna if she cannot tolerate regular foods  Dispo: Anticipated discharge today.  Lars Mage, MD Internal Medicine PGY1 Pager:[phone] 04/03/2017, 2:38 PM

## 2017-04-03 NOTE — Progress Notes (Signed)
[name] to be D/C'd to home per MD order.  Discussed with the patient and all questions fully answered.  VSS, Skin clean, dry and intact without evidence of skin break down, no evidence of skin tears noted. IV catheter discontinued intact. Site without signs and symptoms of complications. Dressing and pressure applied.  An After Visit Summary was printed and given to the patient. Patient received prescriptions.  D/c education completed with patient/family including follow up instructions, medication list, d/c activities limitations if indicated, with other d/c instructions as indicated by MD - patient able to verbalize understanding, all questions fully answered.   Patient instructed to return to ED, call 911, or call MD for any changes in condition.   Patient escorted via [place], and D/C home via private auto.  Lynann Beaver 04/03/2017 4:38 PM

## 2017-04-05 NOTE — Consult Note (Signed)
            [facility] CM Primary Care Navigator  04/05/2017  Jeanelle Dake 09/07/1955 [id number]   Attempt to seepatient at the bedside to identify possible discharge needs but she was alreadydischargedhome per staff report.   Primary care provider's office called (spoke to Hilda Blades) to notify of patient's discharge and need for post hospital follow-up and transition of care. Was notified that patient has a follow-up appointment scheduled with primary care provider on 04/10/17 at 10:45 am.  Made aware to refer patient to [facility] care management if deemed necessary or appropriate for services.   For additional questions please contact:  Edwena Felty A. Cielle Aguila, BSN, RN-BC [facility] PRIMARY CARE Navigator Cell: [phone]

## 2017-04-05 NOTE — Discharge Summary (Signed)
Name: [name] MRN: [id number] DOB: December 17, 1955 61 y.o. PCP: Neva Seat, MD  Date of Admission: 03/29/2017  4:14 PM Date of Discharge: 04/03/2017 Attending Physician: Dr. Joni Reining  Discharge Diagnosis:  Principal Problem:   Chronic abdominal pain possibly be contributed to by mild pancreatic insufficiency   Active Problems:   Diabetes type 2, controlled ([place])   Essential hypertension   Chronic pancreatitis [facility])   Discharge Medications: Allergies as of 04/03/2017      Reactions   Butalbital-aspirin-caffeine Anaphylaxis   Fioricet [butalbital-apap-caffeine] Other (See Comments)   Put into a coma state; "it was actually fiorinol; same family" (02/27/2012)   Shellfish-derived Products Anaphylaxis   Shrimp Flavor Anaphylaxis   unknow   Sulfonamide Derivatives Rash   Aspirin    Stomach burning   Sulfamethoxazole Rash      Medication List    STOP taking these medications   amLODipine 5 MG tablet Commonly known as:  NORVASC   lisinopril-hydrochlorothiazide 20-12.5 MG tablet Commonly known as:  PRINZIDE,ZESTORETIC     TAKE these medications   calcium carbonate 500 MG chewable tablet Commonly known as:  TUMS - dosed in mg elemental calcium Chew 1 tablet by mouth daily as needed for indigestion or heartburn.   lipase/protease/amylase 36000 UNITS Cpep capsule Commonly known as:  CREON Take 72,000 units (2 pills) w each meal, 36,000 units (1 pill) w each snack. PHARMACIST BIN [id number], PCN [facility], GRP [id number], [id number]   metFORMIN 1000 MG tablet Commonly known as:  GLUCOPHAGE Take 1 tablet (1,000 mg total) by mouth 2 (two) times daily with a meal.   MORPHABOND ER 15 MG T12a Generic drug:  Morphine Sulfate ER TK 1 T PO Q 12 H. DISCONTINUE BELBUCA AT LEAST 12 H BEFORE STARTING THIS MEDICATION   promethazine 12.5 MG tablet Commonly known as:  PHENERGAN TAKE 1 TABLET BY MOUTH EVERY 8 HOURS AS NEEDED FOR NAUSEA AND VOMITING What changed:   Another medication with the same name was added. Make sure you understand how and when to take each.   promethazine 12.5 MG tablet Commonly known as:  PHENERGAN Take 1 tablet (12.5 mg total) by mouth every 8 (eight) hours as needed for up to 3 days for nausea or vomiting. What changed:  You were already taking a medication with the same name, and this prescription was added. Make sure you understand how and when to take each.       Disposition and follow-up:   [name].  At the hospital follow up visit please address:  1.  Chronic abdominal pain/mild pancreatic insufficiency: please make sure the patient is taking creon. The patient's home blood pressure medications were held till outpatient follow up.   2.  Labs / imaging needed at time of follow-up: blood pressure check  3.  Pending labs/ test needing follow-up: none  Follow-up Appointments: Follow-up Information    [facility]. Go on 04/10/2017.   Why:  at 10:45 Contact information: [address] [place] Hospital Course by problem list:  Chronic abdominal pain possibly contributed to by mild pancreatic insufficiency: [name] is a 61 year old with history of pancreatic divisum who was admitted from the internal medicine clinic with a 4 day history of epigastric and left upper tenderness accompanied with severe nausea and inability to tolerate even a liquid diet. The patient  did not have any fever, chills, or diarrhea. Due to the patient's history of pancreatic divisum the patient's abdominal pain was thought to be pancreatic in nature. Her amylase=137 and lipase=34. CT abdomen was done which did not show any explanation for the patient's left upper quadrant pain and no evidence of pancreatitis present. The patient has had numerous hospitalization for this   because, however there is speculation about  these complaints being truly pancreatic in origin.  The patient's elastase done on 09/02/2016 was 234 which is in normal range. Colonoscopy was done 05/13/2011 which showed normal mucosa throughout the colon. Fecal fat was normal in 2012. The patient noted a 63lb weight loss over the past few months which maybe partially attributed to the patient's mother passing away. The patient was placed on fluids and given pain medication. The patient has been off of creon for few months because it is expensive and she has not been able to afford with husband's health issues. We spoke to pharmacy and they were able to set her up with affordable creon long-term. Home blood pressure medication was held till follow up.   Discharge Vitals:   BP 126/61 (BP Location: Right Arm)   Pulse 81   Temp 98.2 F (36.8 C) (Oral)   Resp 20   Ht 5' (1.524 m)   Wt 107 lb 12.9 oz (48.9 kg)   SpO2 97%   BMI 21.05 kg/m   Pertinent Labs, Studies, and Procedures:   BMP Latest Ref Rng & Units 03/31/2017 03/30/2017 03/29/2017  Glucose 65 - 99 mg/dL 77 110(H) 111(H)  BUN 6 - 20 mg/dL <5(L) 6 10  Creatinine 0.44 - 1.00 mg/dL 0.68 0.61 0.67  Sodium 135 - 145 mmol/L 138 140 141  Potassium 3.5 - 5.1 mmol/L 4.2 3.8 3.8  Chloride 101 - 111 mmol/L 107 108 102  CO2 22 - 32 mmol/L 25 27 30   Calcium 8.9 - 10.3 mg/dL 9.1 9.1 10.8(H)   CT Abdomen (03/30/17): no explanation for left upper quadrant pain. No evidence of pancreatitis.   Discharge Instructions: Discharge Instructions    Call MD for:  difficulty breathing, headache or visual disturbances   Complete by:  As directed    Call MD for:  extreme fatigue   Complete by:  As directed    Call MD for:  persistant dizziness or light-headedness   Complete by:  As directed    Call MD for:  persistant nausea and vomiting   Complete by:  As directed    Call MD for:  redness, tenderness, or signs of infection (pain, swelling, redness, odor or  green/yellow discharge around incision site)   Complete by:  As directed    Call MD for:  severe uncontrolled pain   Complete by:  As directed    Diet - low sodium heart healthy   Complete by:  As directed    Increase activity slowly   Complete by:  As directed       Signed: Lars Mage, MD Internal Medicine PGY1 Pager:[phone]

## 2017-04-10 ENCOUNTER — Ambulatory Visit: Payer: Medicare HMO

## 2017-04-13 DIAGNOSIS — G894 Chronic pain syndrome: Secondary | ICD-10-CM | POA: Diagnosis not present

## 2017-04-13 DIAGNOSIS — M549 Dorsalgia, unspecified: Secondary | ICD-10-CM | POA: Diagnosis not present

## 2017-04-13 DIAGNOSIS — R1012 Left upper quadrant pain: Secondary | ICD-10-CM | POA: Diagnosis not present

## 2017-04-13 DIAGNOSIS — R51 Headache: Secondary | ICD-10-CM | POA: Diagnosis not present

## 2017-05-18 DIAGNOSIS — R1012 Left upper quadrant pain: Secondary | ICD-10-CM | POA: Diagnosis not present

## 2017-05-18 DIAGNOSIS — Z79899 Other long term (current) drug therapy: Secondary | ICD-10-CM | POA: Diagnosis not present

## 2017-05-18 DIAGNOSIS — G894 Chronic pain syndrome: Secondary | ICD-10-CM | POA: Diagnosis not present

## 2017-05-18 DIAGNOSIS — Z5181 Encounter for therapeutic drug level monitoring: Secondary | ICD-10-CM | POA: Diagnosis not present

## 2017-05-18 DIAGNOSIS — G8929 Other chronic pain: Secondary | ICD-10-CM | POA: Diagnosis not present

## 2017-05-22 ENCOUNTER — Inpatient Hospital Stay (HOSPITAL_COMMUNITY)
Admission: EM | Admit: 2017-05-22 | Discharge: 2017-05-24 | DRG: 194 | Disposition: A | Discharge status: Home / Self Care | Payer: Medicare HMO | Attending: Student in an Organized Health Care Education/Training Program | Admitting: Student in an Organized Health Care Education/Training Program

## 2017-05-22 ENCOUNTER — Other Ambulatory Visit: Payer: Self-pay

## 2017-05-22 ENCOUNTER — Encounter (HOSPITAL_COMMUNITY): Payer: Self-pay | Admitting: Emergency Medicine

## 2017-05-22 ENCOUNTER — Emergency Department (HOSPITAL_COMMUNITY): Payer: Medicare HMO

## 2017-05-22 DIAGNOSIS — R109 Unspecified abdominal pain: Secondary | ICD-10-CM | POA: Diagnosis present

## 2017-05-22 DIAGNOSIS — Z79891 Long term (current) use of opiate analgesic: Secondary | ICD-10-CM | POA: Diagnosis not present

## 2017-05-22 DIAGNOSIS — D573 Sickle-cell trait: Secondary | ICD-10-CM | POA: Diagnosis present

## 2017-05-22 DIAGNOSIS — G8929 Other chronic pain: Secondary | ICD-10-CM | POA: Diagnosis present

## 2017-05-22 DIAGNOSIS — Z86711 Personal history of pulmonary embolism: Secondary | ICD-10-CM | POA: Diagnosis not present

## 2017-05-22 DIAGNOSIS — J09X2 Influenza due to identified novel influenza A virus with other respiratory manifestations: Secondary | ICD-10-CM | POA: Diagnosis not present

## 2017-05-22 DIAGNOSIS — R079 Chest pain, unspecified: Secondary | ICD-10-CM | POA: Diagnosis not present

## 2017-05-22 DIAGNOSIS — Z87891 Personal history of nicotine dependence: Secondary | ICD-10-CM

## 2017-05-22 DIAGNOSIS — D561 Beta thalassemia: Secondary | ICD-10-CM | POA: Diagnosis present

## 2017-05-22 DIAGNOSIS — Z886 Allergy status to analgesic agent status: Secondary | ICD-10-CM | POA: Diagnosis not present

## 2017-05-22 DIAGNOSIS — Z888 Allergy status to other drugs, medicaments and biological substances status: Secondary | ICD-10-CM

## 2017-05-22 DIAGNOSIS — E119 Type 2 diabetes mellitus without complications: Secondary | ICD-10-CM | POA: Diagnosis not present

## 2017-05-22 DIAGNOSIS — K861 Other chronic pancreatitis: Secondary | ICD-10-CM | POA: Diagnosis not present

## 2017-05-22 DIAGNOSIS — Z79899 Other long term (current) drug therapy: Secondary | ICD-10-CM

## 2017-05-22 DIAGNOSIS — K8681 Exocrine pancreatic insufficiency: Secondary | ICD-10-CM | POA: Diagnosis present

## 2017-05-22 DIAGNOSIS — Z882 Allergy status to sulfonamides status: Secondary | ICD-10-CM | POA: Diagnosis not present

## 2017-05-22 DIAGNOSIS — Z7984 Long term (current) use of oral hypoglycemic drugs: Secondary | ICD-10-CM | POA: Diagnosis not present

## 2017-05-22 DIAGNOSIS — F419 Anxiety disorder, unspecified: Secondary | ICD-10-CM | POA: Diagnosis not present

## 2017-05-22 DIAGNOSIS — Z91013 Allergy to seafood: Secondary | ICD-10-CM

## 2017-05-22 DIAGNOSIS — R51 Headache: Secondary | ICD-10-CM | POA: Diagnosis not present

## 2017-05-22 DIAGNOSIS — I10 Essential (primary) hypertension: Secondary | ICD-10-CM | POA: Diagnosis present

## 2017-05-22 DIAGNOSIS — J101 Influenza due to other identified influenza virus with other respiratory manifestations: Secondary | ICD-10-CM | POA: Diagnosis not present

## 2017-05-22 DIAGNOSIS — Q453 Other congenital malformations of pancreas and pancreatic duct: Secondary | ICD-10-CM

## 2017-05-22 DIAGNOSIS — I69354 Hemiplegia and hemiparesis following cerebral infarction affecting left non-dominant side: Secondary | ICD-10-CM | POA: Diagnosis not present

## 2017-05-22 DIAGNOSIS — J449 Chronic obstructive pulmonary disease, unspecified: Secondary | ICD-10-CM | POA: Diagnosis present

## 2017-05-22 DIAGNOSIS — R05 Cough: Secondary | ICD-10-CM | POA: Diagnosis not present

## 2017-05-22 DIAGNOSIS — E785 Hyperlipidemia, unspecified: Secondary | ICD-10-CM | POA: Diagnosis not present

## 2017-05-22 DIAGNOSIS — R0602 Shortness of breath: Secondary | ICD-10-CM | POA: Diagnosis not present

## 2017-05-22 LAB — COMPREHENSIVE METABOLIC PANEL
ALT: 12 U/L — AB (ref 14–54)
AST: 20 U/L (ref 15–41)
Albumin: 4.1 g/dL (ref 3.5–5.0)
Alkaline Phosphatase: 61 U/L (ref 38–126)
Anion gap: 15 (ref 5–15)
BILIRUBIN TOTAL: 0.8 mg/dL (ref 0.3–1.2)
BUN: 7 mg/dL (ref 6–20)
CO2: 21 mmol/L — ABNORMAL LOW (ref 22–32)
CREATININE: 0.75 mg/dL (ref 0.44–1.00)
Calcium: 9.9 mg/dL (ref 8.9–10.3)
Chloride: 101 mmol/L (ref 101–111)
GFR calc Af Amer: >60 mL/min (ref >60)
GLUCOSE: 167 mg/dL — AB (ref 65–99)
Potassium: 3.4 mmol/L — ABNORMAL LOW (ref 3.5–5.1)
Sodium: 137 mmol/L (ref 135–145)
TOTAL PROTEIN: 7.8 g/dL (ref 6.5–8.1)

## 2017-05-22 LAB — CBC
HEMATOCRIT: 31.9 % — AB (ref 36.0–46.0)
Hemoglobin: 10.5 g/dL — ABNORMAL LOW (ref 12.0–15.0)
MCH: 25.7 pg — AB (ref 26.0–34.0)
MCHC: 32.9 g/dL (ref 30.0–36.0)
MCV: 78.2 fL (ref 78.0–100.0)
Platelets: 310 10*3/uL (ref 150–400)
RBC: 4.08 MIL/uL (ref 3.87–5.11)
RDW: 14.5 % (ref 11.5–15.5)
WBC: 6.7 10*3/uL (ref 4.0–10.5)

## 2017-05-22 LAB — CBC WITH DIFFERENTIAL/PLATELET
BASOS ABS: 0 10*3/uL (ref 0.0–0.1)
BASOS PCT: 0 %
Eosinophils Absolute: 0 10*3/uL (ref 0.0–0.7)
Eosinophils Relative: 0 %
HCT: 35.7 % — ABNORMAL LOW (ref 36.0–46.0)
Hemoglobin: 11.7 g/dL — ABNORMAL LOW (ref 12.0–15.0)
Lymphocytes Relative: 5 %
Lymphs Abs: 0.6 10*3/uL — ABNORMAL LOW (ref 0.7–4.0)
MCH: 25.7 pg — ABNORMAL LOW (ref 26.0–34.0)
MCHC: 32.8 g/dL (ref 30.0–36.0)
MCV: 78.5 fL (ref 78.0–100.0)
MONO ABS: 1.2 10*3/uL — AB (ref 0.1–1.0)
MONOS PCT: 12 %
Neutro Abs: 8.5 10*3/uL — ABNORMAL HIGH (ref 1.7–7.7)
Neutrophils Relative %: 83 %
PLATELETS: 370 10*3/uL (ref 150–400)
RBC: 4.55 MIL/uL (ref 3.87–5.11)
RDW: 14.4 % (ref 11.5–15.5)
WBC: 10.3 10*3/uL (ref 4.0–10.5)

## 2017-05-22 LAB — URINALYSIS, ROUTINE W REFLEX MICROSCOPIC
Bilirubin Urine: NEGATIVE
GLUCOSE, UA: NEGATIVE mg/dL
HGB URINE DIPSTICK: NEGATIVE
Ketones, ur: 20 mg/dL — AB
Nitrite: NEGATIVE
PROTEIN: NEGATIVE mg/dL
SPECIFIC GRAVITY, URINE: 1.008 (ref 1.005–1.030)
pH: 5 (ref 5.0–8.0)

## 2017-05-22 LAB — CBG MONITORING, ED: Glucose-Capillary: 108 mg/dL — ABNORMAL HIGH (ref 65–99)

## 2017-05-22 LAB — PROTIME-INR
INR: 0.99
Prothrombin Time: 13 seconds (ref 11.4–15.2)

## 2017-05-22 LAB — I-STAT CG4 LACTIC ACID, ED: LACTIC ACID, VENOUS: 1.79 mmol/L (ref 0.5–1.9)

## 2017-05-22 LAB — INFLUENZA PANEL BY PCR (TYPE A & B)
INFLBPCR: NEGATIVE
Influenza A By PCR: POSITIVE — AB

## 2017-05-22 LAB — LIPASE, BLOOD: LIPASE: 25 U/L (ref 11–51)

## 2017-05-22 MED ORDER — SENNOSIDES-DOCUSATE SODIUM 8.6-50 MG PO TABS: 1.0000 | ORAL_TABLET | Freq: Every evening | ORAL | Status: DC | PRN

## 2017-05-22 MED ORDER — MORPHINE SULFATE ER 15 MG PO TBCR
30.0000 mg | EXTENDED_RELEASE_TABLET | Freq: Once | ORAL | Status: AC
  Administered 2017-05-22: 30 mg via ORAL
  Filled 2017-05-22: qty 2

## 2017-05-22 MED ORDER — SODIUM CHLORIDE 0.9 % IV SOLN
INTRAVENOUS | Status: DC
  Administered 2017-05-22 – 2017-05-23 (×4): via INTRAVENOUS

## 2017-05-22 MED ORDER — IBUPROFEN 800 MG PO TABS
800.0000 mg | ORAL_TABLET | Freq: Three times a day (TID) | ORAL | Status: DC | PRN
  Administered 2017-05-22: 800 mg via ORAL
  Filled 2017-05-22 (×2): qty 1
  Filled 2017-05-22: qty 4

## 2017-05-22 MED ORDER — ACETAMINOPHEN 325 MG PO TABS
650.0000 mg | ORAL_TABLET | Freq: Three times a day (TID) | ORAL | Status: DC
  Administered 2017-05-22 – 2017-05-24 (×5): 650 mg via ORAL
  Filled 2017-05-22 (×5): qty 2

## 2017-05-22 MED ORDER — PHENOL 1.4 % MT LIQD
1.0000 | OROMUCOSAL | Status: DC | PRN
  Administered 2017-05-23: 1 via OROMUCOSAL
  Filled 2017-05-22: qty 177

## 2017-05-22 MED ORDER — MORPHINE SULFATE ER 30 MG PO TBCR
30.0000 mg | EXTENDED_RELEASE_TABLET | Freq: Two times a day (BID) | ORAL | Status: DC
  Administered 2017-05-22 – 2017-05-24 (×4): 30 mg via ORAL
  Filled 2017-05-22: qty 1
  Filled 2017-05-22: qty 2
  Filled 2017-05-22 (×2): qty 1

## 2017-05-22 MED ORDER — IPRATROPIUM-ALBUTEROL 0.5-2.5 (3) MG/3ML IN SOLN: 3.0000 mL | Freq: Two times a day (BID) | RESPIRATORY_TRACT | Status: DC | PRN

## 2017-05-22 MED ORDER — ENOXAPARIN SODIUM 40 MG/0.4ML ~~LOC~~ SOLN
40.0000 mg | SUBCUTANEOUS | Status: DC
  Administered 2017-05-23: 40 mg via SUBCUTANEOUS
  Filled 2017-05-22: qty 0.4

## 2017-05-22 MED ORDER — OSELTAMIVIR PHOSPHATE 75 MG PO CAPS
75.0000 mg | ORAL_CAPSULE | Freq: Once | ORAL | Status: AC
  Administered 2017-05-22: 75 mg via ORAL
  Filled 2017-05-22: qty 1

## 2017-05-22 MED ORDER — CALCIUM CARBONATE ANTACID 500 MG PO CHEW
1.0000 | CHEWABLE_TABLET | Freq: Every day | ORAL | Status: DC | PRN
  Filled 2017-05-22: qty 1

## 2017-05-22 MED ORDER — ACETAMINOPHEN 650 MG RE SUPP: 650.0000 mg | Freq: Four times a day (QID) | RECTAL | Status: DC | PRN

## 2017-05-22 MED ORDER — IBUPROFEN 800 MG PO TABS: 800.0000 mg | ORAL_TABLET | Freq: Three times a day (TID) | ORAL | Status: DC

## 2017-05-22 MED ORDER — OSELTAMIVIR PHOSPHATE 75 MG PO CAPS
75.0000 mg | ORAL_CAPSULE | Freq: Two times a day (BID) | ORAL | Status: DC
Start: 2017-05-22 — End: 2017-05-24
  Administered 2017-05-23 – 2017-05-24 (×3): 75 mg via ORAL
  Filled 2017-05-22 (×5): qty 1

## 2017-05-22 MED ORDER — DIPHENHYDRAMINE HCL 25 MG PO CAPS
25.0000 mg | ORAL_CAPSULE | Freq: Three times a day (TID) | ORAL | Status: DC | PRN
  Administered 2017-05-22 – 2017-05-23 (×2): 25 mg via ORAL
  Filled 2017-05-22 (×2): qty 1

## 2017-05-22 MED ORDER — ACETAMINOPHEN 325 MG PO TABS: 650.0000 mg | ORAL_TABLET | Freq: Four times a day (QID) | ORAL | Status: DC | PRN

## 2017-05-22 MED ORDER — PANCRELIPASE (LIP-PROT-AMYL) 36000-114000 UNITS PO CPEP
72000.0000 [IU] | ORAL_CAPSULE | Freq: Three times a day (TID) | ORAL | Status: DC
  Administered 2017-05-23 – 2017-05-24 (×3): 72000 [IU] via ORAL
  Filled 2017-05-22 (×7): qty 2

## 2017-05-22 MED ORDER — INSULIN ASPART 100 UNIT/ML ~~LOC~~ SOLN
0.0000 [IU] | SUBCUTANEOUS | Status: DC
  Administered 2017-05-23: 1 [IU] via SUBCUTANEOUS

## 2017-05-22 MED ORDER — PROMETHAZINE HCL 25 MG/ML IJ SOLN
12.5000 mg | Freq: Four times a day (QID) | INTRAMUSCULAR | Status: DC | PRN
  Administered 2017-05-24: 12.5 mg via INTRAVENOUS
  Filled 2017-05-22: qty 1

## 2017-05-22 MED ORDER — SODIUM CHLORIDE 0.9 % IV BOLUS (SEPSIS)
1000.0000 mL | Freq: Once | INTRAVENOUS | Status: AC
  Administered 2017-05-22: 1000 mL via INTRAVENOUS

## 2017-05-22 MED ORDER — DM-GUAIFENESIN ER 30-600 MG PO TB12
1.0000 | ORAL_TABLET | Freq: Two times a day (BID) | ORAL | Status: DC
  Administered 2017-05-22 – 2017-05-24 (×4): 1 via ORAL
  Filled 2017-05-22 (×6): qty 1

## 2017-05-22 MED ORDER — ACETAMINOPHEN 325 MG PO TABS
650.0000 mg | ORAL_TABLET | Freq: Once | ORAL | Status: AC
  Administered 2017-05-22: 650 mg via ORAL
  Filled 2017-05-22: qty 2

## 2017-05-22 MED ORDER — BENZONATATE 100 MG PO CAPS: 200.0000 mg | ORAL_CAPSULE | Freq: Three times a day (TID) | ORAL | Status: DC | PRN

## 2017-05-22 MED ORDER — HYDROMORPHONE HCL 1 MG/ML IJ SOLN
1.0000 mg | Freq: Once | INTRAMUSCULAR | Status: AC
  Administered 2017-05-22: 1 mg via INTRAVENOUS
  Filled 2017-05-22: qty 1

## 2017-05-22 NOTE — ED Provider Notes (Signed)
[place] EMERGENCY DEPARTMENT Provider Note   CSN: [id number] Arrival date & time: 05/22/17  3329     History   Chief Complaint Chief Complaint  Patient presents with  . Abdominal Pain  . Cough  . Fever    HPI [name] is a 62 y.o. female.  HPI  62 year old female presents with feeling ill since yesterday.  Started around early afternoon after church.  She states she has been having a cough that has developed into a headache, fever, sore throat, and chills.  Some congestion.  Some shortness of breath but no chest pain.  She states that when she coughs there is clear sputum with occasional streaks of blood.  The cough makes her headache and sore throat worse.  No neck stiffness.  She has not had any vomiting and has not had diarrhea but has had increased solid bowel movements.  She has been having abdominal pain, both lower and left upper quadrant.  Took her MS Contin last night but still in pain.  She states she got a flu shot.  Past Medical History:  Diagnosis Date  . Beta thalassemia ([place])   . Blood dyscrasia    thalasemia, sickle cell trait  . Childhood asthma   . Chronic abdominal pain    Unclear etiology, thought to be due to chronic pancreatitis previously in setting of her pancreatic divisum - however, EUS and EGD performed at [facility]  (10/05/2011) showing normal esophageall, gastric, duodenal mucosa. EUS showing no pancreatic masses, cysts, or changes of chronic pancreatitis. Biliary system nondilated and had no endosonographic abnormalities.  . CVA (cerebral vascular accident) ([place]) 1993   Per report by patient she had a stroke and prolonged rehab course; still "weak on left side but not much" (03/29/2017)  . Family history of adverse reaction to anesthesia    "mom took a long while to wake up; she was allergic to it" (03/29/2017)  . History of blood transfusion 1980s   "related to minor sickle cell crisis" (03/29/2017)  . HLD  (hyperlipidemia) 2009  . Hypertension   . Migraine headache    "q couple years now" (03/29/2017)  . Pancreatic divisum    S/P ERCP with stenting 07/2010 at [facility], then stent removal (08/05/2010)  . Pneumonia    "had it in my teens 3 times" (03/29/2017)  . Pulmonary embolus ([place]) 08/2004   Two areas of V/Q mismatch. Findings compatible  with high probability for pulmonary embolus.; pt was on coumadin for 1 year  . Seizures ([place]) 1993   "after the stroke in 1993; none for years now"  (03/29/2017)  . Sickle cell trait ([place])   . Thalassemia   . Type II diabetes mellitus ([place]) 2010   well controlled  . Uterine cancer [facility])     Patient Active Problem List   Diagnosis Date Noted  . Chronic pancreatitis ([place]) 03/29/2017  . Grief reaction 02/24/2017  . Encounter for screening mammogram for breast cancer 12/02/2016  . Pancreatitis 08/31/2016  . Palpitation 07/18/2016  . Colon cancer screening 05/23/2016  . Opioid dependence ([place]) 04/28/2016  . Sickle cell trait ([place]) 11/16/2011  . Seizure disorder (history after stroke last sz late 1990s) 11/16/2011  . Beta thalassemia ([place]) 11/16/2011  . History of asthma 11/16/2011  . Iron deficiency anemia 11/16/2011  . Chronic abdominal pain   . Preventative health care 07/20/2010  . GERD 08/03/2007  . Congenital anomaly of pancreas 02/28/2006  . HYPERLIPIDEMIA 02/06/2006  . Essential hypertension 02/06/2006  .  Diabetes type 2, controlled ([place]) 02/07/1992    Past Surgical History:  Procedure Laterality Date  . ABDOMINAL HYSTERECTOMY  1980   2/2 endometriosis  . APPENDECTOMY  ?1980   "I think I've had it out" (11/20/2012)  . BREAST LUMPECTOMY Bilateral 1980's   "in my milk ducts; both benign" (02/27/2012)  . CORONARY ANGIOGRAM Bilateral 03/17/2011   Procedure: CORONARY ANGIOGRAM;  Surgeon: Jettie Booze, MD;  Location: [facility] CATH LAB;  Service: Cardiovascular;  Laterality: Bilateral;  . ERCP  07/2010   w/stent placement  at [facility],  then stent removal (08/05/2010)  . ESOPHAGOGASTRODUODENOSCOPY N/A 10/26/2013   Procedure: ESOPHAGOGASTRODUODENOSCOPY (EGD);  Surgeon: Jerene Bears, MD;  Location: [facility] ENDOSCOPY;  Service: Endoscopy;  Laterality: N/A;  . Pancreatic stent placement/removal     placed in 2011; removed in 2012/H&P (02/27/2012); "I've had several" (03/29/2017)  . SPHINCTEROTOMY     Archie Endo 11/20/2012    OB History    No data available       Home Medications    Prior to Admission medications   Medication Sig Start Date End Date Taking? Authorizing Provider  calcium carbonate (TUMS - DOSED IN MG ELEMENTAL CALCIUM) 500 MG chewable tablet Chew 1 tablet by mouth daily as needed for indigestion or heartburn.    [provider]  lipase/protease/amylase (CREON) 36000 UNITS CPEP capsule Take 72,000 units (2 pills) w each meal, 36,000 units (1 pill) w each snack. PHARMACIST BIN [id number], PCN [id number], GRP [id number], [id number] 03/31/17   Burgess Estelle, MD  metFORMIN (GLUCOPHAGE) 1000 MG tablet Take 1 tablet (1,000 mg total) by mouth 2 (two) times daily with a meal. 12/02/16   Shela Leff, MD  [facility] ER 15 MG T12A TK 1 T PO Q 12 H. DISCONTINUE [place] AT LEAST 12 H BEFORE STARTING THIS MEDICATION 03/07/17   [provider]  promethazine (PHENERGAN) 12.5 MG tablet TAKE 1 TABLET BY MOUTH EVERY 8 HOURS AS NEEDED FOR NAUSEA AND VOMITING 03/15/17   Lucious Groves, DO  promethazine (PHENERGAN) 12.5 MG tablet Take 1 tablet (12.5 mg total) by mouth every 8 (eight) hours as needed for up to 3 days for nausea or vomiting. 04/03/17 04/06/17  Lars Mage, MD    Family History Family History  Problem Relation Age of Onset  . Prostate cancer Father   . Cancer Father        stomach ca died x 1 year ago  . Sickle cell anemia Brother   . Lung cancer Maternal Aunt   . Lung cancer Maternal Uncle   . Breast cancer Maternal Grandmother   . Pancreatic cancer Maternal Grandmother   . Heart disease Maternal  Grandfather   . Heart disease Paternal Grandfather   . Diabetes Other   . Cancer Other        multiple cancers pancreas,colon, breast    Social History Social History   Tobacco Use  . Smoking status: Former Smoker    Packs/day: 0.50    Years: 30.00    Pack years: 15.00    Types: Cigarettes    Last attempt to quit: 01/01/1999    Years since quitting: 18.4  . Smokeless tobacco: Never Used  Substance Use Topics  . Alcohol use: No    Alcohol/week: 0.0 oz  . Drug use: No     Allergies   Butalbital-aspirin-caffeine; Fioricet [butalbital-apap-caffeine]; Shellfish-derived products; Shrimp flavor; Sulfonamide derivatives; Aspirin; and Sulfamethoxazole   Review of Systems Review of Systems  Constitutional: Positive for chills and fever.  HENT:  Positive for congestion and sore throat.   Respiratory: Positive for cough and shortness of breath.   Cardiovascular: Negative for chest pain.  Gastrointestinal: Positive for abdominal pain. Negative for constipation, diarrhea and vomiting.  Genitourinary: Negative for dysuria.  Musculoskeletal: Negative for neck stiffness.  Neurological: Positive for headaches.  All other systems reviewed and are negative.    Physical Exam Updated Vital Signs BP (!) 164/90   Pulse (!) 125   Temp 100 F (37.8 C) (Oral)   Resp 18   SpO2 95%   Physical Exam  Constitutional: She is oriented to person, place, and time. She appears well-developed and well-nourished.  HENT:  Head: Normocephalic and atraumatic.  Right Ear: External ear normal.  Left Ear: External ear normal.  Nose: Nose normal.  Mouth/Throat: No oropharyngeal exudate.  Eyes: Pupils are equal, round, and reactive to light. Right eye exhibits no discharge. Left eye exhibits no discharge.  Neck: Normal range of motion. Neck supple.  Cardiovascular: Regular rhythm and normal heart sounds. Tachycardia present.  Pulmonary/Chest: Effort normal and breath sounds normal. She has no wheezes.  She has no rales.  Abdominal: Soft. There is tenderness in the left upper quadrant.  Neurological: She is alert and oriented to person, place, and time.  Skin: Skin is warm and dry. She is not diaphoretic.  Nursing note and vitals reviewed.    ED Treatments / Results  Labs (all labs ordered are listed, but only abnormal results are displayed) Labs Reviewed  COMPREHENSIVE METABOLIC PANEL - Abnormal; Notable for the following components:      Result Value   Potassium 3.4 (*)    CO2 21 (*)    Glucose, Bld 167 (*)    ALT 12 (*)    All other components within normal limits  CBC WITH DIFFERENTIAL/PLATELET - Abnormal; Notable for the following components:   Hemoglobin 11.7 (*)    HCT 35.7 (*)    MCH 25.7 (*)    Neutro Abs 8.5 (*)    Lymphs Abs 0.6 (*)    Monocytes Absolute 1.2 (*)    All other components within normal limits  URINALYSIS, ROUTINE W REFLEX MICROSCOPIC - Abnormal; Notable for the following components:   Color, Urine STRAW (*)    Ketones, ur 20 (*)    Leukocytes, UA TRACE (*)    Bacteria, UA RARE (*)    Squamous Epithelial / LPF 0-5 (*)    All other components within normal limits  INFLUENZA PANEL BY PCR (TYPE A & B) - Abnormal; Notable for the following components:   Influenza A By PCR POSITIVE (*)    All other components within normal limits  CULTURE, BLOOD (ROUTINE X 2)  CULTURE, BLOOD (ROUTINE X 2)  PROTIME-INR  LIPASE, BLOOD  CBC  COMPREHENSIVE METABOLIC PANEL  CBC  I-STAT CG4 LACTIC ACID, ED    EKG  EKG Interpretation None       Radiology Dg Chest 2 View  Result Date: 05/22/2017 CLINICAL DATA:  Cough and chest pain EXAM: CHEST  2 VIEW COMPARISON:  08/31/2016 FINDINGS: The heart size and mediastinal contours are within normal limits. Both lungs are clear. The visualized skeletal structures are unremarkable. IMPRESSION: No active cardiopulmonary disease. Electronically Signed   By: Inez Catalina M.D.   On: 05/22/2017 09:07    Procedures Procedures  (including critical care time)  Angiocath insertion Performed by: Ephraim Hamburger  Consent: Verbal consent obtained. Risks and benefits: risks, benefits and alternatives were discussed Time out: Immediately prior  to procedure a "time out" was called to verify the correct patient, procedure, equipment, support staff and site/side marked as required.  Preparation: Patient was prepped and draped in the usual sterile fashion.  Vein Location: right basilic  Ultrasound Guided  Gauge: 20  Normal blood return and flush without difficulty Patient tolerance: Patient tolerated the procedure well with no immediate complications.    Medications Ordered in ED Medications  sodium chloride 0.9 % bolus 1,000 mL (not administered)  HYDROmorphone (DILAUDID) injection 1 mg (not administered)  acetaminophen (TYLENOL) tablet 650 mg (not administered)     Initial Impression / Assessment and Plan / ED Course  I have reviewed the triage vital signs and the nursing notes.  Pertinent labs & imaging results that were available during my care of the patient were reviewed by me and considered in my medical decision making (see chart for details).     Patient has remained tachycardic despite IV fluids. While she does not otherwise appear critically ill, given this tachycardia with immunosuppression (DM) with influenza, will treat with tamiflu and admit for hydration and supportive care.   Final Clinical Impressions(s) / ED Diagnoses   Final diagnoses:  Influenza A    ED Discharge Orders    None       Sherwood Gambler, MD 05/22/17 1722

## 2017-05-22 NOTE — ED Notes (Addendum)
Patient taken to xray via wheelchair by radiology from Triage.

## 2017-05-22 NOTE — ED Triage Notes (Signed)
Pt arrives for c.o. "flu" states she has generalized abdominal pain, a productive cough with clear mucous. Tachycardic at triage in 120s.

## 2017-05-22 NOTE — ED Notes (Signed)
ED Provider at bedside. 

## 2017-05-22 NOTE — ED Notes (Signed)
Phlebotomy at bedside at this time.

## 2017-05-22 NOTE — Progress Notes (Signed)
Date: 05/22/2017               Patient Name:  [name] MRN: [id number]  DOB: 10/06/1955 Age / Sex: 62 y.o., female   PCP: Neva Seat, MD         Medical Service: Internal Medicine Teaching Service         Attending Physician: Dr. Evette Doffing, Mallie Mussel, *    First Contact: Dr. Lars Mage Pager: [phone]  Second Contact: Dr. Einar Gip Pager: [phone]       After Hours (After 5p/  First Contact Pager: [phone]  weekends / holidays): Second Contact Pager: [phone]   Chief Complaint: Flulike symptoms  History of Present Illness: [name] is a 62 year old female with type 2 diabetes, essential hypertension, hyperlipidemia, opioid dependence who presents with flulike symptoms.  The patient states that she started feeling ill after going to church yesterday.  The patient has a productive cough that brings up yellow mucus with streaks of blood, sore throat that feels like her "throat is on fire", generalized muscle aches, runny nose, chills, abdominal pain.  The patient continued to feel unwell over the course of the day yesterday 05/21/2017 and this morning which prompted her to come to the emergency room.  Denies any nausea, vomiting.  The patient states that she tried to gargle her mouth with salt water which did not help alleviate the pain.  The patient has had a flu in the past.  ED course: Hypertensive, temp of 100, tachycardic, normal respiration, normal saturation on room air Influenza panel-positive for influenza A No lactic acidosis noted  Meds:  Current Meds  Medication Sig  . calcium carbonate (TUMS - DOSED IN MG ELEMENTAL CALCIUM) 500 MG chewable tablet Chew 1 tablet by mouth daily as needed for indigestion or heartburn.  . lipase/protease/amylase (CREON) 36000 UNITS CPEP capsule Take 72,000 units (2 pills) w each meal, 36,000 units (1 pill) w each snack. PHARMACIST BIN [id number], PCN [place], GRP [id number], [id number]  .  lisinopril-hydrochlorothiazide (PRINZIDE,ZESTORETIC) 20-12.5 MG tablet Take 1 tablet by mouth daily.  . metFORMIN (GLUCOPHAGE) 1000 MG tablet Take 1 tablet (1,000 mg total) by mouth 2 (two) times daily with a meal.  . [START ON 06/20/2017] morphine (MS CONTIN) 30 MG 12 hr tablet Take 30 mg by mouth every 12 (twelve) hours.  . promethazine (PHENERGAN) 12.5 MG tablet TAKE 1 TABLET BY MOUTH EVERY 8 HOURS AS NEEDED FOR NAUSEA AND VOMITING     Allergies: Allergies as of 05/22/2017 - Review Complete 05/22/2017  Allergen Reaction Noted  . Butalbital-aspirin-caffeine Anaphylaxis 12/01/2014  . Fioricet [butalbital-apap-caffeine] Other (See Comments)   . Shellfish-derived products Anaphylaxis 12/01/2014  . Shrimp flavor Anaphylaxis 08/25/2010  . Sulfonamide derivatives Rash 07/13/2006  . Aspirin  11/20/2012  . Sulfamethoxazole Rash 12/01/2014   Past Medical History:  Diagnosis Date  . Beta thalassemia ([place])   . Blood dyscrasia    thalasemia, sickle cell trait  . Childhood asthma   . Chronic abdominal pain    Unclear etiology, thought to be due to chronic pancreatitis previously in setting of her pancreatic divisum - however, EUS and EGD performed at [facility]  (10/05/2011) showing normal esophageall, gastric, duodenal mucosa. EUS showing no pancreatic masses, cysts, or changes of chronic pancreatitis. Biliary system nondilated and had no endosonographic abnormalities.  . CVA (cerebral vascular accident) ([place]) 1993   Per report by patient she had a stroke and prolonged rehab course; still "weak on left side but not  much" (03/29/2017)  . Family history of adverse reaction to anesthesia    "mom took a long while to wake up; she was allergic to it" (03/29/2017)  . History of blood transfusion 1980s   "related to minor sickle cell crisis" (03/29/2017)  . HLD (hyperlipidemia) 2009  . Hypertension   . Migraine headache    "q couple years now" (03/29/2017)  . Pancreatic divisum    S/P ERCP with  stenting 07/2010 at [facility], then stent removal (08/05/2010)  . Pneumonia    "had it in my teens 3 times" (03/29/2017)  . Pulmonary embolus ([place]) 08/2004   Two areas of V/Q mismatch. Findings compatible  with high probability for pulmonary embolus.; pt was on coumadin for 1 year  . Seizures ([place]) 1993   "after the stroke in 1993; none for years now"  (03/29/2017)  . Sickle cell trait ([place])   . Thalassemia   . Type II diabetes mellitus ([place]) 2010   well controlled  . Uterine cancer ([place])     Family History:  Family History  Problem Relation Age of Onset  . Prostate cancer Father   . Cancer Father        stomach ca died x 1 year ago  . Sickle cell anemia Brother   . Lung cancer Maternal Aunt   . Lung cancer Maternal Uncle   . Breast cancer Maternal Grandmother   . Pancreatic cancer Maternal Grandmother   . Heart disease Maternal Grandfather   . Heart disease Paternal Grandfather   . Diabetes Other   . Cancer Other        multiple cancers pancreas,colon, breast   Social History:  Social History   Socioeconomic History  . Marital status: Married    Spouse name: None  . Number of children: None  . Years of education: 58  . Highest education level: None  Social Needs  . Financial resource strain: None  . Food insecurity - worry: None  . Food insecurity - inability: None  . Transportation needs - medical: None  . Transportation needs - non-medical: None  Occupational History    Employer: UNEMPLOYED  Tobacco Use  . Smoking status: Former Smoker    Packs/day: 0.50    Years: 30.00    Pack years: 15.00    Types: Cigarettes    Last attempt to quit: 01/01/1999    Years since quitting: 18.4  . Smokeless tobacco: Never Used  Substance and Sexual Activity  . Alcohol use: No    Alcohol/week: 0.0 oz  . Drug use: No  . Sexual activity: Yes    Partners: Male  Other Topics Concern  . None  Social History Narrative   Worked at [employer] as a Librarian, academic, on Retail banker about  9 hours/ day    Review of Systems: A complete ROS was negative except as per HPI.   Physical Exam: Blood pressure (!) 146/75, pulse (!) 103, temperature 100 F (37.8 C), temperature source Oral, resp. rate 18, SpO2 94 %.  Physical Exam  Constitutional: She appears well-developed. She appears distressed.  Tearful on exam  HENT:  Head: Normocephalic and atraumatic.  No sinus tenderness. No anterior cervical lymphadenopathy.  Eyes: Conjunctivae are normal.  Cardiovascular: Normal rate, regular rhythm, normal heart sounds and intact distal pulses.  Respiratory: Effort normal and breath sounds normal. No respiratory distress. She has no wheezes.  GI: Soft. Bowel sounds are normal. She exhibits no distension. There is no tenderness.  Musculoskeletal: She exhibits no edema.  Neurological: She is alert.  Psychiatric: Her behavior is normal. Judgment and thought content normal. Her mood appears anxious.    CBC    Component Value Date/Time   WBC 10.3 05/22/2017 0930   RBC 4.55 05/22/2017 0930   HGB 11.7 (L) 05/22/2017 0930   HGB 12.7 10/13/2006 1445   HCT 35.7 (L) 05/22/2017 0930   HCT 36.8 10/13/2006 1445   PLT 370 05/22/2017 0930   PLT 365 10/13/2006 1445   MCV 78.5 05/22/2017 0930   MCV 81.3 10/13/2006 1445   MCH 25.7 (L) 05/22/2017 0930   MCHC 32.8 05/22/2017 0930   RDW 14.4 05/22/2017 0930   RDW 14.5 10/13/2006 1445   LYMPHSABS 0.6 (L) 05/22/2017 0930   LYMPHSABS 3.5 (H) 10/13/2006 1445   MONOABS 1.2 (H) 05/22/2017 0930   MONOABS 0.5 10/13/2006 1445   EOSABS 0.0 05/22/2017 0930   EOSABS 0.1 10/13/2006 1445   BASOSABS 0.0 05/22/2017 0930   BASOSABS 0.3 (H) 10/13/2006 1445    EKG: personally reviewed my interpretation is normal sinus rhythm without any st or t wave changes.  CXR: personally reviewed my interpretation is unremarkable for any acute cardiopulmonary abnormalities.   Assessment & Plan by Problem:  62 year old female with hypertension, diabetes mellitus  type 2, hyperlipidemia, pancreatitis, COPD who presents with sore throat, generalized muscle weakness, productive cough, chills and found to be positive for influenza A  Influenza A The patient was found to be positive by PCR testing for influenza A.  Patient was afebrile did not have leukocytosis.  -Tamiflu was given  -Diphenhydramine 25mg  q8hrs prn for sleep -Continue duonebs -Continue mucinex bid -Ibuprofen 800mg  tid prn -Tylenol 650mg  q8hrs -Phenergan 12.5mg  q6hrs prn -phenol mouth spray  Essential Hypertension The patient's blood pressure since admission has ranged 118-164/65-90. The patient is on lisinopril hydrochlorothiazide 20-12 0.5 at home.  Chronic Pancreatitis  -Continue creon  Diabetes Mellitus Type 2 The patient's last A1c is 6.6 (03/29/17).  The patient is on Metformin thousand mill twice daily at home.  -SSI  Dispo: Admit patient to Inpatient with expected length of stay greater than 2 midnights.  SignedLars Mage, MD 05/22/2017, 4:51 PM  Pager: Pager: [phone]

## 2017-05-22 NOTE — ED Notes (Signed)
Pt in xray

## 2017-05-23 ENCOUNTER — Encounter (HOSPITAL_COMMUNITY): Payer: Self-pay

## 2017-05-23 DIAGNOSIS — J09X2 Influenza due to identified novel influenza A virus with other respiratory manifestations: Secondary | ICD-10-CM

## 2017-05-23 DIAGNOSIS — Z888 Allergy status to other drugs, medicaments and biological substances status: Secondary | ICD-10-CM | POA: Diagnosis not present

## 2017-05-23 DIAGNOSIS — R109 Unspecified abdominal pain: Secondary | ICD-10-CM | POA: Diagnosis present

## 2017-05-23 DIAGNOSIS — G8929 Other chronic pain: Secondary | ICD-10-CM

## 2017-05-23 DIAGNOSIS — F419 Anxiety disorder, unspecified: Secondary | ICD-10-CM | POA: Diagnosis not present

## 2017-05-23 DIAGNOSIS — D573 Sickle-cell trait: Secondary | ICD-10-CM | POA: Diagnosis present

## 2017-05-23 DIAGNOSIS — E785 Hyperlipidemia, unspecified: Secondary | ICD-10-CM | POA: Diagnosis present

## 2017-05-23 DIAGNOSIS — J449 Chronic obstructive pulmonary disease, unspecified: Secondary | ICD-10-CM | POA: Diagnosis present

## 2017-05-23 DIAGNOSIS — I10 Essential (primary) hypertension: Secondary | ICD-10-CM | POA: Diagnosis present

## 2017-05-23 DIAGNOSIS — K8681 Exocrine pancreatic insufficiency: Secondary | ICD-10-CM | POA: Diagnosis present

## 2017-05-23 DIAGNOSIS — K861 Other chronic pancreatitis: Secondary | ICD-10-CM

## 2017-05-23 DIAGNOSIS — D561 Beta thalassemia: Secondary | ICD-10-CM | POA: Diagnosis present

## 2017-05-23 DIAGNOSIS — Z8249 Family history of ischemic heart disease and other diseases of the circulatory system: Secondary | ICD-10-CM

## 2017-05-23 DIAGNOSIS — Z91013 Allergy to seafood: Secondary | ICD-10-CM

## 2017-05-23 DIAGNOSIS — Z86711 Personal history of pulmonary embolism: Secondary | ICD-10-CM | POA: Diagnosis not present

## 2017-05-23 DIAGNOSIS — Z79899 Other long term (current) drug therapy: Secondary | ICD-10-CM

## 2017-05-23 DIAGNOSIS — Z7984 Long term (current) use of oral hypoglycemic drugs: Secondary | ICD-10-CM | POA: Diagnosis not present

## 2017-05-23 DIAGNOSIS — I69354 Hemiplegia and hemiparesis following cerebral infarction affecting left non-dominant side: Secondary | ICD-10-CM | POA: Diagnosis not present

## 2017-05-23 DIAGNOSIS — Z87891 Personal history of nicotine dependence: Secondary | ICD-10-CM | POA: Diagnosis not present

## 2017-05-23 DIAGNOSIS — Z79891 Long term (current) use of opiate analgesic: Secondary | ICD-10-CM

## 2017-05-23 DIAGNOSIS — J101 Influenza due to other identified influenza virus with other respiratory manifestations: Secondary | ICD-10-CM | POA: Diagnosis present

## 2017-05-23 DIAGNOSIS — E119 Type 2 diabetes mellitus without complications: Secondary | ICD-10-CM | POA: Diagnosis present

## 2017-05-23 DIAGNOSIS — Z833 Family history of diabetes mellitus: Secondary | ICD-10-CM

## 2017-05-23 DIAGNOSIS — Z886 Allergy status to analgesic agent status: Secondary | ICD-10-CM | POA: Diagnosis not present

## 2017-05-23 DIAGNOSIS — Z882 Allergy status to sulfonamides status: Secondary | ICD-10-CM | POA: Diagnosis not present

## 2017-05-23 LAB — CBC
HCT: 29.2 % — ABNORMAL LOW (ref 36.0–46.0)
Hemoglobin: 9.6 g/dL — ABNORMAL LOW (ref 12.0–15.0)
MCH: 25.3 pg — ABNORMAL LOW (ref 26.0–34.0)
MCHC: 32.9 g/dL (ref 30.0–36.0)
MCV: 77 fL — ABNORMAL LOW (ref 78.0–100.0)
PLATELETS: 315 10*3/uL (ref 150–400)
RBC: 3.79 MIL/uL — AB (ref 3.87–5.11)
RDW: 14.6 % (ref 11.5–15.5)
WBC: 4.2 10*3/uL (ref 4.0–10.5)

## 2017-05-23 LAB — COMPREHENSIVE METABOLIC PANEL
ALT: 10 U/L — AB (ref 14–54)
AST: 25 U/L (ref 15–41)
Albumin: 2.9 g/dL — ABNORMAL LOW (ref 3.5–5.0)
Alkaline Phosphatase: 40 U/L (ref 38–126)
Anion gap: 14 (ref 5–15)
BILIRUBIN TOTAL: 0.6 mg/dL (ref 0.3–1.2)
BUN: 9 mg/dL (ref 6–20)
CALCIUM: 8.4 mg/dL — AB (ref 8.9–10.3)
CHLORIDE: 110 mmol/L (ref 101–111)
CO2: 19 mmol/L — ABNORMAL LOW (ref 22–32)
Creatinine, Ser: 0.72 mg/dL (ref 0.44–1.00)
Glucose, Bld: 76 mg/dL (ref 65–99)
Potassium: 3.8 mmol/L (ref 3.5–5.1)
Sodium: 143 mmol/L (ref 135–145)
TOTAL PROTEIN: 5.6 g/dL — AB (ref 6.5–8.1)

## 2017-05-23 LAB — GLUCOSE, CAPILLARY
GLUCOSE-CAPILLARY: 86 mg/dL (ref 65–99)
Glucose-Capillary: 82 mg/dL (ref 65–99)
Glucose-Capillary: 69 mg/dL (ref 65–99)
Glucose-Capillary: 139 mg/dL — ABNORMAL HIGH (ref 65–99)
Glucose-Capillary: 73 mg/dL (ref 65–99)

## 2017-05-23 MED ORDER — LISINOPRIL 10 MG PO TABS
10.0000 mg | ORAL_TABLET | Freq: Every day | ORAL | Status: DC
  Administered 2017-05-23: 10 mg via ORAL
  Filled 2017-05-23: qty 1

## 2017-05-23 NOTE — H&P (Signed)
Date: 05/22/2017               Patient Name:  [name] MRN: [id number]  DOB: [birth year] Age / Sex: 62 y.o., female   PCP: Neva Seat, MD         Medical Service: Internal Medicine Teaching Service         Attending Physician: Dr. Evette Doffing, Mallie Mussel, *    First Contact: Dr. Lars Mage Pager: [phone]  Second Contact: Dr. Einar Gip Pager: [phone]       After Hours (After 5p/  First Contact Pager: [phone]  weekends / holidays): Second Contact Pager: [phone]   Chief Complaint: Flulike symptoms  History of Present Illness: [name] is a 62 year old female with type 2 diabetes, essential hypertension, hyperlipidemia, opioid dependence who presents with flulike symptoms.  The patient states that she started feeling ill after going to church yesterday.  The patient has a productive cough that brings up yellow mucus with streaks of blood, sore throat that feels like her "throat is on fire", generalized muscle aches, runny nose, chills, abdominal pain.  The patient continued to feel unwell over the course of the day yesterday 05/21/2017 and this morning which prompted her to come to the emergency room.  Denies any nausea, vomiting.  The patient states that she tried to gargle her mouth with salt water which did not help alleviate the pain.  The patient has had a flu in the past.  ED course: Hypertensive, temp of 100, tachycardic, normal respiration, normal saturation on room air Influenza panel-positive for influenza A No lactic acidosis noted  Meds:  Current Meds  Medication Sig  . calcium carbonate (TUMS - DOSED IN MG ELEMENTAL CALCIUM) 500 MG chewable tablet Chew 1 tablet by mouth daily as needed for indigestion or heartburn.  . lipase/protease/amylase (CREON) 36000 UNITS CPEP capsule Take 72,000 units (2 pills) w each meal, 36,000 units (1 pill) w each snack. PHARMACIST BIN [id number], PCN Peggs, GRP [id number], [id number]  .  lisinopril-hydrochlorothiazide (PRINZIDE,ZESTORETIC) 20-12.5 MG tablet Take 1 tablet by mouth daily.  . metFORMIN (GLUCOPHAGE) 1000 MG tablet Take 1 tablet (1,000 mg total) by mouth 2 (two) times daily with a meal.  . [START ON 06/20/2017] morphine (MS CONTIN) 30 MG 12 hr tablet Take 30 mg by mouth every 12 (twelve) hours.  . promethazine (PHENERGAN) 12.5 MG tablet TAKE 1 TABLET BY MOUTH EVERY 8 HOURS AS NEEDED FOR NAUSEA AND VOMITING     Allergies: Allergies as of 05/22/2017 - Review Complete 05/22/2017  Allergen Reaction Noted  . Butalbital-aspirin-caffeine Anaphylaxis 12/01/2014  . Fioricet [butalbital-apap-caffeine] Other (See Comments)   . Shellfish-derived products Anaphylaxis 12/01/2014  . Shrimp flavor Anaphylaxis 08/25/2010  . Sulfonamide derivatives Rash 07/13/2006  . Aspirin  11/20/2012  . Sulfamethoxazole Rash 12/01/2014   Past Medical History:  Diagnosis Date  . Beta thalassemia ([place])   . Blood dyscrasia    thalasemia, sickle cell trait  . Childhood asthma   . Chronic abdominal pain    Unclear etiology, thought to be due to chronic pancreatitis previously in setting of her pancreatic divisum - however, EUS and EGD performed at [facility]  (10/05/2011) showing normal esophageall, gastric, duodenal mucosa. EUS showing no pancreatic masses, cysts, or changes of chronic pancreatitis. Biliary system nondilated and had no endosonographic abnormalities.  . CVA (cerebral vascular accident) ([place]) 1993   Per report by patient she had a stroke and prolonged rehab course; still "weak on left side but not  much" (03/29/2017)  . Family history of adverse reaction to anesthesia    "mom took a long while to wake up; she was allergic to it" (03/29/2017)  . History of blood transfusion 1980s   "related to minor sickle cell crisis" (03/29/2017)  . HLD (hyperlipidemia) 2009  . Hypertension   . Migraine headache    "q couple years now" (03/29/2017)  . Pancreatic divisum    S/P ERCP with  stenting 07/2010 at [facility], then stent removal (08/05/2010)  . Pneumonia    "had it in my teens 3 times" (03/29/2017)  . Pulmonary embolus ([place]) 08/2004   Two areas of V/Q mismatch. Findings compatible  with high probability for pulmonary embolus.; pt was on coumadin for 1 year  . Seizures ([place]) 1993   "after the stroke in 1993; none for years now"  (03/29/2017)  . Sickle cell trait ([place])   . Thalassemia   . Type II diabetes mellitus ([place]) 2010   well controlled  . Uterine cancer ([place])     Family History:  Family History  Problem Relation Age of Onset  . Prostate cancer Father   . Cancer Father        stomach ca died x 1 year ago  . Sickle cell anemia Brother   . Lung cancer Maternal Aunt   . Lung cancer Maternal Uncle   . Breast cancer Maternal Grandmother   . Pancreatic cancer Maternal Grandmother   . Heart disease Maternal Grandfather   . Heart disease Paternal Grandfather   . Diabetes Other   . Cancer Other        multiple cancers pancreas,colon, breast   Social History:  Social History   Socioeconomic History  . Marital status: Married    Spouse name: None  . Number of children: None  . Years of education: 66  . Highest education level: None  Social Needs  . Financial resource strain: None  . Food insecurity - worry: None  . Food insecurity - inability: None  . Transportation needs - medical: None  . Transportation needs - non-medical: None  Occupational History    Employer: UNEMPLOYED  Tobacco Use  . Smoking status: Former Smoker    Packs/day: 0.50    Years: 30.00    Pack years: 15.00    Types: Cigarettes    Last attempt to quit: 01/01/1999    Years since quitting: 18.4  . Smokeless tobacco: Never Used  Substance and Sexual Activity  . Alcohol use: No    Alcohol/week: 0.0 oz  . Drug use: No  . Sexual activity: Yes    Partners: Male  Other Topics Concern  . None  Social History Narrative   Worked at [employer] as a Librarian, academic, on Retail banker about  9 hours/ day    Review of Systems: A complete ROS was negative except as per HPI.   Physical Exam: Blood pressure (!) 146/75, pulse (!) 103, temperature 100 F (37.8 C), temperature source Oral, resp. rate 18, SpO2 94 %.  Physical Exam  Constitutional: She appears well-developed. She appears distressed.  Tearful on exam  HENT:  Head: Normocephalic and atraumatic.  No sinus tenderness. No anterior cervical lymphadenopathy.  Eyes: Conjunctivae are normal.  Cardiovascular: Normal rate, regular rhythm, normal heart sounds and intact distal pulses.  Respiratory: Effort normal and breath sounds normal. No respiratory distress. She has no wheezes.  GI: Soft. Bowel sounds are normal. She exhibits no distension. There is no tenderness.  Musculoskeletal: She exhibits no edema.  Neurological: She is alert.  Psychiatric: Her behavior is normal. Judgment and thought content normal. Her mood appears anxious.    CBC    Component Value Date/Time   WBC 10.3 05/22/2017 0930   RBC 4.55 05/22/2017 0930   HGB 11.7 (L) 05/22/2017 0930   HGB 12.7 10/13/2006 1445   HCT 35.7 (L) 05/22/2017 0930   HCT 36.8 10/13/2006 1445   PLT 370 05/22/2017 0930   PLT 365 10/13/2006 1445   MCV 78.5 05/22/2017 0930   MCV 81.3 10/13/2006 1445   MCH 25.7 (L) 05/22/2017 0930   MCHC 32.8 05/22/2017 0930   RDW 14.4 05/22/2017 0930   RDW 14.5 10/13/2006 1445   LYMPHSABS 0.6 (L) 05/22/2017 0930   LYMPHSABS 3.5 (H) 10/13/2006 1445   MONOABS 1.2 (H) 05/22/2017 0930   MONOABS 0.5 10/13/2006 1445   EOSABS 0.0 05/22/2017 0930   EOSABS 0.1 10/13/2006 1445   BASOSABS 0.0 05/22/2017 0930   BASOSABS 0.3 (H) 10/13/2006 1445    EKG: personally reviewed my interpretation is normal sinus rhythm without any st or t wave changes.  CXR: personally reviewed my interpretation is unremarkable for any acute cardiopulmonary abnormalities.   Assessment & Plan by Problem:  62 year old female with hypertension, diabetes mellitus  type 2, hyperlipidemia, pancreatitis, COPD who presents with sore throat, generalized muscle weakness, productive cough, chills and found to be positive for influenza A  Influenza A The patient was found to be positive by PCR testing for influenza A.  Patient was afebrile did not have leukocytosis.  -Tamiflu was given  -Diphenhydramine 25mg  q8hrs prn for sleep -Continue duonebs -Continue mucinex bid -Ibuprofen 800mg  tid prn -Tylenol 650mg  q8hrs -Phenergan 12.5mg  q6hrs prn -phenol mouth spray  Essential Hypertension The patient's blood pressure since admission has ranged 118-164/65-90. The patient is on lisinopril hydrochlorothiazide 20-12 0.5 at home.  Chronic Pancreatitis  -Continue creon  Diabetes Mellitus Type 2 The patient's last A1c is 6.6 (03/29/17).  The patient is on Metformin thousand mill twice daily at home.  -SSI  Dispo: Admit patient to Inpatient with expected length of stay greater than 2 midnights.  SignedLars Mage, MD 05/22/2017, 4:51 PM  Pager: Pager: [phone]

## 2017-05-23 NOTE — Progress Notes (Signed)
   Subjective: [name] was seen resting in her bed this morning. She was tearful on exam and stated that she continues to feel ill.   Objective:  Vital signs in last 24 hours: Vitals:   05/22/17 2036 05/22/17 2206 05/23/17 0500 05/23/17 1440  BP: 137/71  125/67 (!) 150/66  Pulse: (!) 108 (!) 101 90 67  Resp:    16  Temp: (!) 101.7 F (38.7 C) 99.9 F (37.7 C) 98 F (36.7 C) 98.6 F (37 C)  TempSrc: Oral Oral Oral Oral  SpO2: 94% 91% 94% 98%   Physical Exam  Constitutional: She appears well-developed and well-nourished.  HENT:  Head: Normocephalic and atraumatic.  Eyes: Conjunctivae are normal.  Cardiovascular: Normal rate, regular rhythm, normal heart sounds and intact distal pulses.  Respiratory: Effort normal and breath sounds normal. No respiratory distress. She has no wheezes.  GI: Soft. Bowel sounds are normal. She exhibits no distension. There is no tenderness.  Musculoskeletal: She exhibits no edema.  Neurological: She is alert.  Skin: No erythema.  Psychiatric: Her mood appears anxious.    Assessment/Plan:  62 year old female with hypertension, diabetes mellitus type 2, hyperlipidemia, pancreatitis, COPD who presents with sore throat, generalized muscle weakness, productive cough, chills and found to be positive for influenza A  Influenza A The patient continues to have symptoms of flu and pcr is positive for influenza A. The patient continues to be afebrile and does not have a new oxygen requirement.  The patient has chronic pain issues from her chronic abdominal pain so would be wary of adding any opiod meds.    -Tamiflu was given  -Diphenhydramine 25mg  q8hrs prn for sleep -Continue duonebs -Continue mucinex bid -Ibuprofen 800mg  tid prn -Tylenol 650mg  q8hrs -Phenergan 12.5mg  q6hrs prn -phenol mouth spray  Essential Hypertension The patient's blood pressure this morning has been 150/66. The patient is on lisinopril hydrochlorothiazide 20-12.5mg   at home.  -started lisinopril 10mg  po  Chronic Pancreatitis  -Continue creon  Diabetes Mellitus Type 2 The patient's blood glucose has ranged 69-82.  -SSI   Dispo: Anticipated discharge in approximately 1 day(s).   Lars Mage, MD Internal Medicine PGY1 Pager:[phone] 05/23/2017, 3:25 PM

## 2017-05-23 NOTE — Progress Notes (Signed)
Internal Medicine Attending:   I saw and examined the patient. I reviewed the resident's note and I agree with the resident's findings and plan as documented in the resident's note.  Making slow progress from influenza infection, complicated by her chronic abdominal pain and possible chronic pancreatitis. Plan to continue with tamiflu and supportive care. Advance diet and activity, and hopefully will be ready for discharge to home tomorrow.

## 2017-05-23 NOTE — Research (Signed)
Title: A Randomized, Double-Blind, Placebo-Controlled Dose Ranging Study Evaluating the Safety Pharmacokinetics and Clinical Benefit of FLU-IGIV in Hospitalized Patients with Serious Influenza A infection. IA-001 (ClinicalTrials.gov Identifier: [id number], Protocol No: IA-001, [facility] Protocol #[id number])  RESEARCH SUBJECT. This research study is sponsored by [employer]   Protocol: amendment 4 -> 22 Sep 2016  The investigational product is called NP-025 aka FLU-IGIV or anti-influenza immune globulin intravenous. It is produced from source plasma collected from [name] Transport planner (FDA) licensed plasma collection establishments from healthy donors who have recovered from influenza (convalescent) and/or were vaccinated against seasonal influenza strains. The plasma contains a relatively high concentration of polyclonal antibodies directed against seasonal influenza strains, specifically influenza A strains H1N1 ([place]) and H3N2 ([place]). It is a glycoprotein of 150-160 kilodaltons against Hemagluttinin (HA) and Neuraminidase (NA) surface proteins.   .............................................................................................. S: d/w patient above study - she is not interested + she is not a good candidate - will not able to come for followup   Dr. Brand Males, M.D., [facility].C.P Pulmonary and Critical Care Medicine Staff Physician, [place] Director - Interstitial Lung Disease  Program  P[facility] [place] [facility] [place], [place], [address]  Pager: [phone], If no answer or between  15:00h - 7:00h: call [phone] Telephone: [phone]

## 2017-05-24 ENCOUNTER — Other Ambulatory Visit: Payer: Self-pay | Admitting: Internal Medicine

## 2017-05-24 LAB — GLUCOSE, CAPILLARY
Glucose-Capillary: 95 mg/dL (ref 65–99)
Glucose-Capillary: 94 mg/dL (ref 65–99)
Glucose-Capillary: 113 mg/dL — ABNORMAL HIGH (ref 65–99)

## 2017-05-24 MED ORDER — OSELTAMIVIR PHOSPHATE 75 MG PO CAPS: 75.0000 mg | ORAL_CAPSULE | Freq: Two times a day (BID) | ORAL | 0 refills | Status: AC

## 2017-05-24 MED ORDER — PROMETHAZINE HCL 12.5 MG PO TABS: ORAL_TABLET | ORAL | 0 refills | Status: DC

## 2017-05-24 MED ORDER — ACETAMINOPHEN 325 MG PO TABS: 650.0000 mg | ORAL_TABLET | Freq: Four times a day (QID) | ORAL | 0 refills | Status: DC | PRN

## 2017-05-24 MED ORDER — LISINOPRIL 20 MG PO TABS
20.0000 mg | ORAL_TABLET | Freq: Every day | ORAL | Status: DC
  Administered 2017-05-24: 20 mg via ORAL
  Filled 2017-05-24: qty 1

## 2017-05-24 MED ORDER — LISINOPRIL-HYDROCHLOROTHIAZIDE 20-12.5 MG PO TABS: 1.0000 | ORAL_TABLET | Freq: Every day | ORAL | 1 refills | Status: DC

## 2017-05-24 NOTE — Plan of Care (Signed)
  Adequate for Discharge Health Behavior/Discharge Planning: Ability to manage health-related needs will improve 05/24/2017 1551 - Adequate for Discharge by Rance Muir, RN 05/24/2017 1136 - Progressing by Rance Muir, RN Clinical Measurements: Ability to maintain clinical measurements within normal limits will improve 05/24/2017 1551 - Adequate for Discharge by Rance Muir, RN 05/24/2017 1136 - Progressing by Rance Muir, RN Will remain free from infection 05/24/2017 1551 - Adequate for Discharge by Rance Muir, RN 05/24/2017 1136 - Progressing by Rance Muir, RN Diagnostic test results will improve 05/24/2017 1551 - Adequate for Discharge by Rance Muir, RN 05/24/2017 1136 - Progressing by Rance Muir, RN Respiratory complications will improve 05/24/2017 1551 - Adequate for Discharge by Rance Muir, RN 05/24/2017 1136 - Progressing by Rance Muir, RN Cardiovascular complication will be avoided 05/24/2017 1551 - Adequate for Discharge by Rance Muir, RN 05/24/2017 1136 - Progressing by Rance Muir, RN Activity: Risk for activity intolerance will decrease 05/24/2017 1551 - Adequate for Discharge by Rance Muir, RN 05/24/2017 1136 - Progressing by Rance Muir, RN Nutrition: Adequate nutrition will be maintained 05/24/2017 1551 - Adequate for Discharge by Rance Muir, RN 05/24/2017 1136 - Progressing by Rance Muir, RN Coping: Level of anxiety will decrease 05/24/2017 1551 - Adequate for Discharge by Rance Muir, RN 05/24/2017 1136 - Progressing by Rance Muir, RN Elimination: Will not experience complications related to bowel motility 05/24/2017 1551 - Adequate for Discharge by Rance Muir, RN 05/24/2017 1136 - Progressing by Rance Muir, RN Will not experience complications related to urinary retention 05/24/2017 1551 - Adequate for Discharge by Rance Muir, RN 05/24/2017 1136 - Progressing by Rance Muir, RN Pain Managment: General experience of comfort will improve 05/24/2017 1551 - Adequate for Discharge by Rance Muir, RN 05/24/2017 1136  - Progressing by Rance Muir, RN Safety: Ability to remain free from injury will improve 05/24/2017 1551 - Adequate for Discharge by Rance Muir, RN 05/24/2017 1136 - Progressing by Rance Muir, RN Skin Integrity: Risk for impaired skin integrity will decrease 05/24/2017 1551 - Adequate for Discharge by Rance Muir, RN 05/24/2017 1136 - Progressing by Rance Muir, RN

## 2017-05-24 NOTE — Progress Notes (Signed)
   Subjective: [name] was seen laying in her bed resting this morning. She stated that she continues to have generalized weakness and cough.  Objective:  Vital signs in last 24 hours: Vitals:   05/23/17 0500 05/23/17 1440 05/23/17 2030 05/24/17 0631  BP: 125/67 (!) 150/66 (!) 158/90 (!) 157/73  Pulse: 90 67 72 72  Resp:  16 16 16   Temp: 98 F (36.7 C) 98.6 F (37 C) 99.4 F (37.4 C) 99.1 F (37.3 C)  TempSrc: Oral Oral Oral Oral  SpO2: 94% 98% 97% 97%   Physical Exam  Constitutional: She appears well-developed and well-nourished. No distress.  HENT:  Head: Normocephalic and atraumatic.  Eyes: Conjunctivae are normal.  Cardiovascular: Normal rate, regular rhythm and normal heart sounds.  Respiratory: Effort normal and breath sounds normal. No respiratory distress. She has no wheezes.  GI: Soft. Bowel sounds are normal. She exhibits no distension. There is no tenderness.  Musculoskeletal: She exhibits no edema.  Neurological: She is alert.  Skin: She is not diaphoretic. No erythema.  Psychiatric: She has a normal mood and affect. Her behavior is normal. Judgment and thought content normal.   Assessment/Plan:  62 year old female with hypertension, diabetes mellitus type 2, hyperlipidemia, pancreatitis, COPD who presents with sore throat, generalized muscle weakness, productive cough, chills and found to be positive for influenza A  Influenza A The patient continues to have generalized weakness and productive cough.   -Tamiflu continue for 5 days -Diphenhydramine 25mg  q8hrs prn for sleep -Continue duonebs -Continue mucinex bid -Ibuprofen 800mg  tid prn -Tylenol 650mg  q8hrs -Phenergan 12.5mg  q6hrs prn -phenol mouth spray  Essential Hypertension The patient's blood pressure has been ranging in the 734L systolic and 93-79K diastolic. The patient is on lisinopril hydrochlorothiazide 20-12.5mg  at home.  -Increased lisinopril from 10mg  po back to home  20mg   Chronic Pancreatitis  -Continue creon  Diabetes Mellitus Type 2 The patient's blood glucose has ranged 73-113.  -SSI  Dispo: Anticipated discharge in approximately 0-1 day(s).   Eileen Mage, MD Internal Medicine PGY1 Pager:[phone] 05/24/2017, 1:23 PM

## 2017-05-24 NOTE — Discharge Instructions (Signed)
It was a pleasure to take care of you [name]. -please continue to take tamiflu for 2 more days -please take Tylenol 650mg  every 4 hrs as needed if you have temp>100.5 -please drink plenty of fluids   Influenza, Adult Influenza (the flu") is an infection in the lungs, nose, and throat (respiratory tract). It is caused by a virus. The flu causes many common cold symptoms, as well as a high fever and body aches. It can make you feel very sick. The flu spreads easily from person to person (is contagious). Getting a flu shot (influenza vaccination) every year is the best way to prevent the flu. Follow these instructions at home:  Take over-the-counter and prescription medicines only as told by your doctor.  Use a cool mist humidifier to add moisture (humidity) to the air in your home. This can make it easier to breathe.  Rest as needed.  Drink enough fluid to keep your pee (urine) clear or pale yellow.  Cover your mouth and nose when you cough or sneeze.  Wash your hands with soap and water often, especially after you cough or sneeze. If you cannot use soap and water, use hand sanitizer.  Stay home from work or school as told by your doctor. Unless you are visiting your doctor, try to avoid leaving home until your fever has been gone for 24 hours without the use of medicine.  Keep all follow-up visits as told by your doctor. This is important. How is this prevented?  Getting a yearly (annual) flu shot is the best way to avoid getting the flu. You may get the flu shot in late summer, fall, or winter. Ask your doctor when you should get your flu shot.  Wash your hands often or use hand sanitizer often.  Avoid contact with people who are sick during cold and flu season.  Eat healthy foods.  Drink plenty of fluids.  Get enough sleep.  Exercise regularly. Contact a doctor if:  You get new symptoms.  You have: ? Chest pain. ? Watery poop (diarrhea). ? A  fever.  Your cough gets worse.  You start to have more mucus.  You feel sick to your stomach (nauseous).  You throw up (vomit). Get help right away if:  You start to be short of breath or have trouble breathing.  Your skin or nails turn a bluish color.  You have very bad pain or stiffness in your neck.  You get a sudden headache.  You get sudden pain in your face or ear.  You cannot stop throwing up. This information is not intended to replace advice given to you by your health care provider. Make sure you discuss any questions you have with your health care provider. Document Released: 01/26/2008 Document Revised: 09/24/2015 Document Reviewed: 02/10/2015 Elsevier Interactive Patient Education  2017 [employer].

## 2017-05-24 NOTE — Plan of Care (Signed)
  Progressing Health Behavior/Discharge Planning: Ability to manage health-related needs will improve 05/24/2017 1136 - Progressing by Rance Muir, RN Clinical Measurements: Ability to maintain clinical measurements within normal limits will improve 05/24/2017 1136 - Progressing by Rance Muir, RN Will remain free from infection 05/24/2017 1136 - Progressing by Rance Muir, RN Diagnostic test results will improve 05/24/2017 1136 - Progressing by Rance Muir, RN Respiratory complications will improve 05/24/2017 1136 - Progressing by Rance Muir, RN Cardiovascular complication will be avoided 05/24/2017 1136 - Progressing by Rance Muir, RN Activity: Risk for activity intolerance will decrease 05/24/2017 1136 - Progressing by Rance Muir, RN Nutrition: Adequate nutrition will be maintained 05/24/2017 1136 - Progressing by Rance Muir, RN Coping: Level of anxiety will decrease 05/24/2017 1136 - Progressing by Rance Muir, RN Elimination: Will not experience complications related to bowel motility 05/24/2017 1136 - Progressing by Rance Muir, RN Will not experience complications related to urinary retention 05/24/2017 1136 - Progressing by Rance Muir, RN Pain Managment: General experience of comfort will improve 05/24/2017 1136 - Progressing by Rance Muir, RN Safety: Ability to remain free from injury will improve 05/24/2017 1136 - Progressing by Rance Muir, RN Skin Integrity: Risk for impaired skin integrity will decrease 05/24/2017 1136 - Progressing by Rance Muir, RN

## 2017-05-24 NOTE — Discharge Summary (Signed)
Name: [name] MRN: [id number] DOB: Nov 13, 1955 62 y.o. PCP: Neva Seat, MD  Date of Admission: 05/22/2017  8:35 AM Date of Discharge: 05/24/17 Attending Physician: Axel Filler, *  Discharge Diagnosis:  Principal Problem:   Influenza A  Discharge Medications: Allergies as of 05/24/2017      Reactions   Butalbital-aspirin-caffeine Anaphylaxis   Fioricet [butalbital-apap-caffeine] Other (See Comments)   Put into a coma state; "it was actually fiorinol; same family" (02/27/2012)   Shellfish-derived Products Anaphylaxis   Shrimp Flavor Anaphylaxis   unknow   Sulfonamide Derivatives Rash   Aspirin    Stomach burning   Sulfamethoxazole Rash      Medication List    TAKE these medications   acetaminophen 325 MG tablet Commonly known as:  TYLENOL Take 2 tablets (650 mg total) by mouth every 6 (six) hours as needed for mild pain (or Fever >/= 101).   calcium carbonate 500 MG chewable tablet Commonly known as:  TUMS - dosed in mg elemental calcium Chew 1 tablet by mouth daily as needed for indigestion or heartburn.   lipase/protease/amylase 36000 UNITS Cpep capsule Commonly known as:  CREON Take 72,000 units (2 pills) w each meal, 36,000 units (1 pill) w each snack. PHARMACIST BIN [id number], PCN [facility], GRP [id number], [id number]   lisinopril-hydrochlorothiazide 20-12.5 MG tablet Commonly known as:  PRINZIDE,ZESTORETIC Take 1 tablet by mouth daily.   metFORMIN 1000 MG tablet Commonly known as:  GLUCOPHAGE Take 1 tablet (1,000 mg total) by mouth 2 (two) times daily with a meal.   morphine 30 MG 12 hr tablet Commonly known as:  MS CONTIN Take 30 mg by mouth every 12 (twelve) hours. Start taking on:  06/20/2017   oseltamivir 75 MG capsule Commonly known as:  TAMIFLU Take 1 capsule (75 mg total) by mouth 2 (two) times daily for 2 days.   promethazine 12.5 MG tablet Commonly known as:  PHENERGAN TAKE 1 TABLET BY MOUTH EVERY 8 HOURS AS NEEDED FOR  NAUSEA AND VOMITING What changed:  Another medication with the same name was removed. Continue taking this medication, and follow the directions you see here.       Disposition and follow-up:   [name] was discharged from [facility] in stable condition.  At the hospital follow up visit please address:  1.  Influenza A-please ensure that the patient's symptoms have resolved and that she does not develop any super infections  2.  Labs / imaging needed at time of follow-up: none  3.  Pending labs/ test needing follow-up: none  Follow-up Appointments:   Hospital Course by problem list:  Influenza A [name] presented with generalized body aches, non-productive cough, sore throat, and chills for past 2 days. The patient tested positive for influenza a. She is on chronic long acting opiods, has poor coping skills, and had a low pain tolerance which prompted admission for flu. The patient was treated with tamiflu for 5 day course and given supportive therapy. Discharged with instructions on continued supportive therapy.   Discharge Vitals:   BP 118/82 (BP Location: Right Arm)   Pulse 79   Temp 98.5 F (36.9 C) (Oral)   Resp 16   SpO2 98%   Pertinent Labs, Studies, and Procedures:  CBC    Component Value Date/Time   WBC 4.2 05/23/2017 1029   RBC 3.79 (L) 05/23/2017 1029   HGB 9.6 (L) 05/23/2017 1029   HGB 12.7 10/13/2006 1445   HCT 29.2 (L) 05/23/2017 1029  HCT 36.8 10/13/2006 1445   PLT 315 05/23/2017 1029   PLT 365 10/13/2006 1445   MCV 77.0 (L) 05/23/2017 1029   MCV 81.3 10/13/2006 1445   MCH 25.3 (L) 05/23/2017 1029   MCHC 32.9 05/23/2017 1029   RDW 14.6 05/23/2017 1029   RDW 14.5 10/13/2006 1445   LYMPHSABS 0.6 (L) 05/22/2017 0930   LYMPHSABS 3.5 (H) 10/13/2006 1445   MONOABS 1.2 (H) 05/22/2017 0930   MONOABS 0.5 10/13/2006 1445   EOSABS 0.0 05/22/2017 0930   EOSABS 0.1 10/13/2006 1445   BASOSABS 0.0 05/22/2017 0930    BASOSABS 0.3 (H) 10/13/2006 1445   BMP Latest Ref Rng & Units 05/23/2017 05/22/2017 03/31/2017  Glucose 65 - 99 mg/dL 76 167(H) 77  BUN 6 - 20 mg/dL 9 7 <5(L)  Creatinine 0.44 - 1.00 mg/dL 0.72 0.75 0.68  Sodium 135 - 145 mmol/L 143 137 138  Potassium 3.5 - 5.1 mmol/L 3.8 3.4(L) 4.2  Chloride 101 - 111 mmol/L 110 101 107  CO2 22 - 32 mmol/L 19(L) 21(L) 25  Calcium 8.9 - 10.3 mg/dL 8.4(L) 9.9 9.1   Influenza a positive  Lactic acid=1.79 Blood culture negative   Discharge Instructions: Discharge Instructions    Call MD for:  extreme fatigue   Complete by:  As directed    Call MD for:  persistant dizziness or light-headedness   Complete by:  As directed    Call MD for:  persistant nausea and vomiting   Complete by:  As directed    Call MD for:  redness, tenderness, or signs of infection (pain, swelling, redness, odor or green/yellow discharge around incision site)   Complete by:  As directed    Call MD for:  temperature >100.4   Complete by:  As directed    Diet - low sodium heart healthy   Complete by:  As directed    Increase activity slowly   Complete by:  As directed       Signed: Lars Mage, MD 05/24/2017, 2:48 PM   Pager: [phone]

## 2017-05-25 ENCOUNTER — Other Ambulatory Visit: Payer: Self-pay | Admitting: Internal Medicine

## 2017-05-25 NOTE — Progress Notes (Signed)
Discontinued As 90 day supply has been ordered.

## 2017-05-25 NOTE — Telephone Encounter (Signed)
Refill approved. 30 day supply discontinued

## 2017-05-27 LAB — CULTURE, BLOOD (ROUTINE X 2)
CULTURE: NO GROWTH
Culture: NO GROWTH
SPECIAL REQUESTS: ADEQUATE
Special Requests: ADEQUATE

## 2017-05-30 ENCOUNTER — Other Ambulatory Visit: Payer: Self-pay | Admitting: *Deleted

## 2017-05-30 NOTE — Patient Outreach (Signed)
[facility]) Care Management  05/30/2017  Emery Binz 1955-11-21 [id number]  Referral from [employer]: Recent discharge from inpatient admission from [facility] 05/24/2017:  Per chart review: Admission 1/21-1/23/2019 Dx: Influenza A  Telephone call attempt to patient; left HIPPA compliant voice mail requesting call back.  Plan: Will follow up.  Sherrin Daisy, RN BSN [place] Management Coordinator [facility] Care Management  [phone]

## 2017-05-31 ENCOUNTER — Telehealth: Payer: Self-pay

## 2017-05-31 ENCOUNTER — Other Ambulatory Visit: Payer: Self-pay | Admitting: *Deleted

## 2017-05-31 NOTE — Telephone Encounter (Signed)
Needs to speak with a nurse about a cough. Please call back.

## 2017-05-31 NOTE — Patient Outreach (Signed)
[facility]) Care Management  05/31/2017  [name] 08-03-1955 [id number]  Referral from [employer]: Recent discharge from inpatient admission from [facility] 05/24/2017:  Per chart review: Admission 1/21-1/23/2019 Dx: Influenza A  Telephone call attempt x 2; left HIPPA compliant voice mail requesting call back.  Plan: Will follow up.  Sherrin Daisy, RN BSN [place] Management Coordinator [facility] Care Management  [phone]

## 2017-05-31 NOTE — Telephone Encounter (Signed)
rtc to pt, she states since being disch from hosp she has gotten a worse cough with ache in back, ribs and chest, she states it might just be from coughing so much but would like to be seen to confirm. appt at pt's choosing fri 2/1 am [facility]. She is advised that if she develops weakness, severe h/a, N&V, constant chest pain, shortness of breath to call 911 or seek emergent care. She is agreeable

## 2017-06-01 ENCOUNTER — Other Ambulatory Visit: Payer: Self-pay | Admitting: *Deleted

## 2017-06-01 ENCOUNTER — Encounter: Payer: Self-pay | Admitting: *Deleted

## 2017-06-01 NOTE — Patient Outreach (Signed)
[facility]) Care Management  06/01/2017  [name] 04/14/1956 [id number]   Referral from [employer]: Recent discharge from inpatient admission from [facility] 05/24/2017:  Per chart review: Admission 1/21-1/23/2019 Dx: Influenza A  Telephone call attempt x 3; left voice mail requesting return call.  Plan: Geophysicist/field seismologist. Will follow up.  Sherrin Daisy, RN BSN [place] Management Coordinator [facility] Care Management  [phone]

## 2017-06-02 ENCOUNTER — Other Ambulatory Visit: Payer: Self-pay

## 2017-06-02 ENCOUNTER — Ambulatory Visit (INDEPENDENT_AMBULATORY_CARE_PROVIDER_SITE_OTHER): Payer: Medicare HMO | Admitting: Internal Medicine

## 2017-06-02 VITALS — BP 136/86 | HR 86 | Temp 98.7°F | Ht 60.0 in | Wt 99.7 lb

## 2017-06-02 DIAGNOSIS — I10 Essential (primary) hypertension: Secondary | ICD-10-CM

## 2017-06-02 DIAGNOSIS — Z87891 Personal history of nicotine dependence: Secondary | ICD-10-CM

## 2017-06-02 DIAGNOSIS — R05 Cough: Secondary | ICD-10-CM

## 2017-06-02 DIAGNOSIS — E119 Type 2 diabetes mellitus without complications: Secondary | ICD-10-CM

## 2017-06-02 DIAGNOSIS — J101 Influenza due to other identified influenza virus with other respiratory manifestations: Secondary | ICD-10-CM | POA: Diagnosis not present

## 2017-06-02 DIAGNOSIS — K861 Other chronic pancreatitis: Secondary | ICD-10-CM

## 2017-06-02 MED ORDER — CHLORPHENIRAMINE-ACETAMINOPHEN 2-325 MG PO TABS: 1.0000 | ORAL_TABLET | Freq: Four times a day (QID) | ORAL | 0 refills | Status: AC | PRN

## 2017-06-02 NOTE — Assessment & Plan Note (Signed)
The patient was hospitalized from 05/22/17 to 05/24/17 for flu.  The patient states that she was discharged with Tamiflu and she took the medication as instructed till 05/26/2017.  She is continue to have a cough.  The patient describes the cough to be productive in nature and producing white sputum and accompanied with bronchospasms that caused chest and back pain that is 6 on 10 intensity.  The patient states that the cough is not worse in any particular part of the day and is constant and keeps her up at night.  Patient has accompanied sore throat, postnasal drip, decrease in appetite, shortness of breath, dysphagia.  She states that she had a fever when checking her temperature on Wednesday 05/31/17, however she has not noticed any fevers subsequently.  She denies any sneezing or chills.  I suspect that the patient has continued cough secondary to her influenza A infection.  She will likely had can have the cough for another week or 2.  -Prescribed Coricidin 2-325 mg every 6 hours for 7 days -Instructed the patient to drink plenty of fluids, drink warm tea, and get plenty of rest -Instructed patient to return to clinic if she continues to have a cough after 10 days

## 2017-06-02 NOTE — Patient Instructions (Addendum)
It was a pleasure to see you today [name]. Please make the following changes:  You may continue to experience a cough for another week post your flu.  Please take 1 tablet of Coricidin every 6 hours on an as needed basis. If you continue to experience a cough after 10 days, please come back to the clinic.   If you have any questions or concerns, please call our clinic at [phone] between 9am-5pm and after hours call [phone] and ask for the internal medicine resident on call. If you feel you are having a medical emergency please call 911.   Thank you, we look forward to help you remain healthy!  Lars Mage, MD Internal Medicine PGY1   Cough, Adult A cough helps to clear your throat and lungs. A cough may last only 2-3 weeks (acute), or it may last longer than 8 weeks (chronic). Many different things can cause a cough. A cough may be a sign of an illness or another medical condition. Follow these instructions at home:  Pay attention to any changes in your cough.  Take medicines only as told by your doctor. ? If you were prescribed an antibiotic medicine, take it as told by your doctor. Do not stop taking it even if you start to feel better. ? Talk with your doctor before you try using a cough medicine.  Drink enough fluid to keep your pee (urine) clear or pale yellow.  If the air is dry, use a cold steam vaporizer or humidifier in your home.  Stay away from things that make you cough at work or at home.  If your cough is worse at night, try using extra pillows to raise your head up higher while you sleep.  Do not smoke, and try not to be around smoke. If you need help quitting, ask your doctor.  Do not have caffeine.  Do not drink alcohol.  Rest as needed. Contact a doctor if:  You have new problems (symptoms).  You cough up yellow fluid (pus).  Your cough does not get better after 2-3 weeks, or your cough gets worse.  Medicine does not help your  cough and you are not sleeping well.  You have pain that gets worse or pain that is not helped with medicine.  You have a fever.  You are losing weight and you do not know why.  You have night sweats. Get help right away if:  You cough up blood.  You have trouble breathing.  Your heartbeat is very fast. This information is not intended to replace advice given to you by your health care provider. Make sure you discuss any questions you have with your health care provider. Document Released: 12/30/2010 Document Revised: 09/24/2015 Document Reviewed: 06/25/2014 Elsevier Interactive Patient Education  [employer].

## 2017-06-02 NOTE — Progress Notes (Addendum)
   CC: Cough  HPI:  [name] is a 62 y.o. with past medical history of hypertension, chronic pancreatitis, type 2 diabetes mellitus who presents for cough and hospital follow-up.  Please see problem based charting for evaluation, assessment, and plan.  Past Medical History:  Diagnosis Date  . Beta thalassemia ([place])   . Blood dyscrasia    thalasemia, sickle cell trait  . Childhood asthma   . Chronic abdominal pain    Unclear etiology, thought to be due to chronic pancreatitis previously in setting of her pancreatic divisum - however, EUS and EGD performed at [facility]  (10/05/2011) showing normal esophageall, gastric, duodenal mucosa. EUS showing no pancreatic masses, cysts, or changes of chronic pancreatitis. Biliary system nondilated and had no endosonographic abnormalities.  . CVA (cerebral vascular accident) ([place]) 1993   Per report by patient she had a stroke and prolonged rehab course; still "weak on left side but not much" (03/29/2017)  . Family history of adverse reaction to anesthesia    "mom took a long while to wake up; she was allergic to it" (03/29/2017)  . History of blood transfusion 1980s   "related to minor sickle cell crisis" (03/29/2017)  . HLD (hyperlipidemia) 2009  . Hypertension   . Migraine headache    "q couple years now" (03/29/2017)  . Pancreatic divisum    S/P ERCP with stenting 07/2010 at [facility], then stent removal (08/05/2010)  . Pneumonia    "had it in my teens 3 times" (03/29/2017)  . Pulmonary embolus ([place]) 08/2004   Two areas of V/Q mismatch. Findings compatible  with high probability for pulmonary embolus.; pt was on coumadin for 1 year  . Seizures ([place]) 1993   "after the stroke in 1993; none for years now"  (03/29/2017)  . Sickle cell trait ([place])   . Thalassemia   . Type II diabetes mellitus ([place]) 2010   well controlled  . Uterine cancer ([place])    Review of Systems:  sore throat, postnasal drip, decrease in appetite, shortness  of breath, dysphagia, fever. Denies chills or sneezing.   Physical Exam:  Vitals:   06/02/17 0921  BP: 136/86  Pulse: 86  Temp: 98.7 F (37.1 C)  TempSrc: Oral  SpO2: 99%  Weight: 99 lb 11.2 oz (45.2 kg)  Height: 5' (1.524 m)   Physical Exam  Constitutional: She appears well-developed and well-nourished. No distress.  HENT:  Head: Normocephalic and atraumatic.  Mouth/Throat: Oropharynx is clear and moist. No oropharyngeal exudate.  No anterior or posterior cervical lymphadenopathy  Posterior pharynx is without any erythema or exudate  Eyes: Conjunctivae are normal.  Cardiovascular: Normal rate, regular rhythm, normal heart sounds and intact distal pulses.  Respiratory: Effort normal and breath sounds normal. No respiratory distress. She has no wheezes.  Musculoskeletal: She exhibits no edema.  Neurological: She is alert.  Skin: She is not diaphoretic. No erythema.  Psychiatric: She has a normal mood and affect. Her behavior is normal. Judgment and thought content normal.     Assessment & Plan:   See Encounters Tab for problem based charting.  Patient discussed with Dr. Rebeca Alert

## 2017-06-05 NOTE — Progress Notes (Signed)
Internal Medicine Clinic Attending  Case discussed with Dr. Chundi  at the time of the visit.  We reviewed the resident's history and exam and pertinent patient test results.  I agree with the assessment, diagnosis, and plan of care documented in the resident's note.  Alexander N Raines, MD   

## 2017-06-06 ENCOUNTER — Telehealth: Payer: Self-pay | Admitting: Internal Medicine

## 2017-06-06 NOTE — Telephone Encounter (Signed)
The patient was given coricidin tablets during her last visit for her cough that was sent to [facility].

## 2017-06-06 NOTE — Telephone Encounter (Signed)
Patient is getting over the flu, patient came in on last Friday. Patient is wanting to know if the doctor can call her in some cough medicine

## 2017-06-07 ENCOUNTER — Telehealth: Payer: Self-pay | Admitting: Internal Medicine

## 2017-06-07 NOTE — Telephone Encounter (Signed)
Pt stated she has been taking Coricidin but it has not help with her cough. Requesting something else. Thanks

## 2017-06-07 NOTE — Telephone Encounter (Signed)
Patient is calling back  °

## 2017-06-07 NOTE — Telephone Encounter (Signed)
Hello Dr. Rebeca Alert,   The patient's cough has not subsided on coricidin. Can I prescribe hydrocodone /homatropine 45ml po q4-6hr prn and tell her to stop the coricidin?  Thanks,  [employer]

## 2017-06-08 NOTE — Telephone Encounter (Signed)
Hello!  What is the patient calling back about?

## 2017-06-09 NOTE — Telephone Encounter (Signed)
Hello Dr. Rebeca Alert,   [name] called back stating that she continues to have cough while on coricidin. Can I prescribe hydrocodone-homatropin? Please let me know. Thank you!  Amelie Caracci

## 2017-06-09 NOTE — Telephone Encounter (Signed)
Hello Dr. Rebeca Alert,   [name] called back stating that she continues to have cough while on coricidin. Can I prescribe hydrocodone-homatropin? Please let me know. Thank you!  Charman Blasco

## 2017-06-09 NOTE — Telephone Encounter (Signed)
Pt has been calling about her constant cough.

## 2017-06-12 ENCOUNTER — Telehealth: Payer: Self-pay | Admitting: Internal Medicine

## 2017-06-12 MED ORDER — HYDROCODONE-HOMATROPINE 5-1.5 MG/5ML PO SYRP: 5.0000 mL | ORAL_SOLUTION | Freq: Four times a day (QID) | ORAL | 0 refills | Status: AC | PRN

## 2017-06-12 NOTE — Telephone Encounter (Signed)
Spoke with patient who stated that she has continued to have a productive cough. She states that coricidin is not working   -Prescribed hydrocodone-homatropine syrup for the patient

## 2017-06-13 NOTE — Telephone Encounter (Signed)
See Dr Thea Gist telephone note from yesterday.

## 2017-06-15 ENCOUNTER — Other Ambulatory Visit: Payer: Self-pay | Admitting: *Deleted

## 2017-06-15 ENCOUNTER — Encounter: Payer: Self-pay | Admitting: *Deleted

## 2017-06-15 NOTE — Patient Outreach (Signed)
[facility]) Care Management  06/15/2017  [name] Jun 05, 1955 [id number]   Referral from [employer]: Recent discharge from inpatient admission from [facility] 05/24/2017:  Unsuccessful call attempt x 3; no response to outreach letter.  Plan: Close case/send to care management assistant. Send MD closure letter.  Sherrin Daisy, RN BSN [place] Management Coordinator [facility] Care Management  [phone]

## 2017-07-13 DIAGNOSIS — M549 Dorsalgia, unspecified: Secondary | ICD-10-CM | POA: Diagnosis not present

## 2017-07-13 DIAGNOSIS — G8929 Other chronic pain: Secondary | ICD-10-CM | POA: Diagnosis not present

## 2017-07-13 DIAGNOSIS — R1012 Left upper quadrant pain: Secondary | ICD-10-CM | POA: Diagnosis not present

## 2017-07-13 DIAGNOSIS — G894 Chronic pain syndrome: Secondary | ICD-10-CM | POA: Diagnosis not present

## 2017-07-17 ENCOUNTER — Other Ambulatory Visit: Payer: Self-pay

## 2017-07-17 DIAGNOSIS — Q453 Other congenital malformations of pancreas and pancreatic duct: Secondary | ICD-10-CM

## 2017-07-17 MED ORDER — PROMETHAZINE HCL 12.5 MG PO TABS: ORAL_TABLET | ORAL | 0 refills | Status: DC

## 2017-07-17 NOTE — Telephone Encounter (Signed)
Refill Approved

## 2017-07-17 NOTE — Telephone Encounter (Signed)
promethazine (PHENERGAN) 12.5 MG tablet, Refill request @ [facility] on [address]

## 2017-08-10 ENCOUNTER — Encounter (INDEPENDENT_AMBULATORY_CARE_PROVIDER_SITE_OTHER): Payer: Self-pay

## 2017-08-10 ENCOUNTER — Other Ambulatory Visit: Payer: Self-pay | Admitting: Internal Medicine

## 2017-08-10 ENCOUNTER — Encounter: Payer: Self-pay | Admitting: Internal Medicine

## 2017-08-10 ENCOUNTER — Ambulatory Visit: Payer: Medicare HMO | Admitting: Internal Medicine

## 2017-08-10 ENCOUNTER — Other Ambulatory Visit: Payer: Self-pay

## 2017-08-10 VITALS — BP 172/89 | HR 81 | Temp 98.0°F | Ht 60.0 in | Wt 99.9 lb

## 2017-08-10 DIAGNOSIS — Z7984 Long term (current) use of oral hypoglycemic drugs: Secondary | ICD-10-CM | POA: Diagnosis not present

## 2017-08-10 DIAGNOSIS — K219 Gastro-esophageal reflux disease without esophagitis: Secondary | ICD-10-CM

## 2017-08-10 DIAGNOSIS — Q453 Other congenital malformations of pancreas and pancreatic duct: Secondary | ICD-10-CM

## 2017-08-10 DIAGNOSIS — I10 Essential (primary) hypertension: Secondary | ICD-10-CM | POA: Diagnosis not present

## 2017-08-10 DIAGNOSIS — Z79899 Other long term (current) drug therapy: Secondary | ICD-10-CM | POA: Diagnosis not present

## 2017-08-10 DIAGNOSIS — K861 Other chronic pancreatitis: Secondary | ICD-10-CM

## 2017-08-10 DIAGNOSIS — F4320 Adjustment disorder, unspecified: Secondary | ICD-10-CM | POA: Diagnosis not present

## 2017-08-10 DIAGNOSIS — F4321 Adjustment disorder with depressed mood: Secondary | ICD-10-CM

## 2017-08-10 DIAGNOSIS — E119 Type 2 diabetes mellitus without complications: Secondary | ICD-10-CM

## 2017-08-10 MED ORDER — METFORMIN HCL 1000 MG PO TABS: 1000.0000 mg | ORAL_TABLET | Freq: Two times a day (BID) | ORAL | 3 refills | Status: DC

## 2017-08-10 MED ORDER — PROMETHAZINE HCL 12.5 MG PO TABS: ORAL_TABLET | ORAL | 0 refills | Status: DC

## 2017-08-10 MED ORDER — RANITIDINE HCL 150 MG PO TABS: 150.0000 mg | ORAL_TABLET | Freq: Two times a day (BID) | ORAL | 0 refills | Status: DC | PRN

## 2017-08-10 NOTE — Progress Notes (Signed)
Internal Medicine Clinic Attending  Case discussed with Dr. Guilloud  at the time of the visit.  We reviewed the resident's history and exam and pertinent patient test results.  I agree with the assessment, diagnosis, and plan of care documented in the resident's note.  Alexander N Raines, MD   

## 2017-08-10 NOTE — Assessment & Plan Note (Signed)
Patient has symptoms of uncontrolled acid reflux on PRN Tums.  -- Zantac BID prn

## 2017-08-10 NOTE — Progress Notes (Signed)
   CC: Abdominal pain, nausea  HPI:  [name] is a 62 y.o. female with past medical history outlined below here for abdominal pain, nausea. For the details of today's visit, please refer to the assessment and plan.  Past Medical History:  Diagnosis Date  . Beta thalassemia ([place])   . Blood dyscrasia    thalasemia, sickle cell trait  . Childhood asthma   . Chronic abdominal pain    Unclear etiology, thought to be due to chronic pancreatitis previously in setting of her pancreatic divisum - however, EUS and EGD performed at [facility]  (10/05/2011) showing normal esophageall, gastric, duodenal mucosa. EUS showing no pancreatic masses, cysts, or changes of chronic pancreatitis. Biliary system nondilated and had no endosonographic abnormalities.  . CVA (cerebral vascular accident) ([place]) 1993   Per report by patient she had a stroke and prolonged rehab course; still "weak on left side but not much" (03/29/2017)  . Family history of adverse reaction to anesthesia    "mom took a long while to wake up; she was allergic to it" (03/29/2017)  . History of blood transfusion 1980s   "related to minor sickle cell crisis" (03/29/2017)  . HLD (hyperlipidemia) 2009  . Hypertension   . Migraine headache    "q couple years now" (03/29/2017)  . Pancreatic divisum    S/P ERCP with stenting 07/2010 at [facility], then stent removal (08/05/2010)  . Pneumonia    "had it in my teens 3 times" (03/29/2017)  . Pulmonary embolus ([place]) 08/2004   Two areas of V/Q mismatch. Findings compatible  with high probability for pulmonary embolus.; pt was on coumadin for 1 year  . Seizures ([place]) 1993   "after the stroke in 1993; none for years now"  (03/29/2017)  . Sickle cell trait ([place])   . Thalassemia   . Type II diabetes mellitus ([place]) 2010   well controlled  . Uterine cancer ([place])     Review of Systems  Gastrointestinal: Positive for abdominal pain, heartburn and nausea. Negative for vomiting.     Physical Exam:  Vitals:   08/10/17 0947  BP: (!) 172/89  Pulse: 81  Temp: 98 F (36.7 C)  TempSrc: Oral  SpO2: 100%  Weight: 99 lb 14.4 oz (45.3 kg)  Height: 5' (1.524 m)    Constitutional: NAD, appears comfortable Cardiovascular: RRR, no murmurs, rubs, or gallops.  Pulmonary/Chest: CTAB, no wheezes, rales, or rhonchi. Abdominal: Soft, non tender, non distended. +BS.  Psychiatric: Normal mood and affect  Assessment & Plan:   See Encounters Tab for problem based charting.  Patient discussed with Dr. Rebeca Alert

## 2017-08-10 NOTE — Assessment & Plan Note (Signed)
Blood pressure is elevated today, however on chart review she has previously been well controlled.  She is very upset in the setting of her niece's recent death. No medication changes today.  Follow-up PCP.

## 2017-08-10 NOTE — Assessment & Plan Note (Signed)
Patient reports recently losing her niece 1 week ago after she was hit by a bus and killed.  She has been in significant emotional distress since that time.  Is been helping her sister care for her niece's son who is 74.  She reports symptoms of anxiety and inability to sleep. I provided emotional support today. She is willing to meet with a grief counselor.  -- Referral placed -- Follow up as needed

## 2017-08-10 NOTE — Patient Instructions (Addendum)
FOLLOW-UP INSTRUCTIONS When: 3 months For: PCP follow up What to bring: Medications  [name],  It was a pleasure to meet you. I am so sorry to hear about your niece. I have placed a referral for you to meet with a counselor, you will be contacted to schedule this. For your abdominal pain and nausea, I have refilled your phenergan. I am working with the pharmacy on your creon. For your acid reflux, you may take zantac twice a day as needed for heartburn. Please follow up with your PCP in 3 months. If you have any questions or concerns, call our clinic at [phone] or after hours call [phone] and ask for the internal medicine resident on call. Thank you!  - Dr. Philipp Ovens

## 2017-08-10 NOTE — Assessment & Plan Note (Signed)
Refilled metformin

## 2017-08-10 NOTE — Assessment & Plan Note (Signed)
Patient has a history of chronic pancreatitis.  She has been out of her Creon for 2 months now, reports she is unable to afford it.  She has symptoms of generalized abdominal pain, nausea, and loose stool.  She has follow-up scheduled with her gastroenterologist at [facility] in the near future.  I spoke with our clinic pharmacists who are working on applying for medication assistance for this patient.  I appreciate their help.  We will treat supportively for now.  -- Refilled PRN phenergan  -- Will restart creon when able

## 2017-08-11 ENCOUNTER — Telehealth: Payer: Self-pay

## 2017-08-11 NOTE — Telephone Encounter (Signed)
Spoke wit [name] about getting patient assistance for Creon through the manufacturer. She will come in to clinic on Tuesday to sign the paperwork and bring her tax information.  Patterson Hammersmith PharmD PGY1 Pharmacy Practice Resident 08/11/2017 2:55 PM Pager: [phone]

## 2017-08-15 ENCOUNTER — Ambulatory Visit: Payer: Medicare HMO | Admitting: Pharmacist

## 2017-08-15 DIAGNOSIS — Z0000 Encounter for general adult medical examination without abnormal findings: Secondary | ICD-10-CM

## 2017-08-15 NOTE — Progress Notes (Signed)
[name] came in to clinic for follow-up on patient assistance for her Creon. We filled out the paper work and documented her insurance and financial information. Will follow up on status of application.  Patterson Hammersmith PharmD PGY1 Pharmacy Practice Resident 08/15/2017 10:15 AM

## 2017-08-23 NOTE — Progress Notes (Signed)
Patient was seen in clinic by Austin Lucas, PharmD, PGY1 pharmacy resident. I agree with the assessment and plan of care documented.  

## 2017-08-24 ENCOUNTER — Encounter: Payer: Self-pay | Admitting: Internal Medicine

## 2017-09-06 ENCOUNTER — Other Ambulatory Visit: Payer: Self-pay | Admitting: Internal Medicine

## 2017-09-06 DIAGNOSIS — Q453 Other congenital malformations of pancreas and pancreatic duct: Secondary | ICD-10-CM

## 2017-09-06 NOTE — Telephone Encounter (Signed)
Refill approved.

## 2017-09-12 DIAGNOSIS — Z79899 Other long term (current) drug therapy: Secondary | ICD-10-CM | POA: Diagnosis not present

## 2017-09-12 DIAGNOSIS — M549 Dorsalgia, unspecified: Secondary | ICD-10-CM | POA: Diagnosis not present

## 2017-09-12 DIAGNOSIS — R51 Headache: Secondary | ICD-10-CM | POA: Diagnosis not present

## 2017-09-12 DIAGNOSIS — Z5181 Encounter for therapeutic drug level monitoring: Secondary | ICD-10-CM | POA: Diagnosis not present

## 2017-09-12 DIAGNOSIS — G894 Chronic pain syndrome: Secondary | ICD-10-CM | POA: Diagnosis not present

## 2017-09-12 DIAGNOSIS — R1012 Left upper quadrant pain: Secondary | ICD-10-CM | POA: Diagnosis not present

## 2017-10-19 ENCOUNTER — Other Ambulatory Visit: Payer: Self-pay | Admitting: Internal Medicine

## 2017-10-19 DIAGNOSIS — K219 Gastro-esophageal reflux disease without esophagitis: Secondary | ICD-10-CM

## 2017-10-19 DIAGNOSIS — Q453 Other congenital malformations of pancreas and pancreatic duct: Secondary | ICD-10-CM

## 2017-10-20 ENCOUNTER — Other Ambulatory Visit: Payer: Self-pay | Admitting: Internal Medicine

## 2017-10-20 DIAGNOSIS — K219 Gastro-esophageal reflux disease without esophagitis: Secondary | ICD-10-CM

## 2017-10-20 NOTE — Telephone Encounter (Signed)
Refill approved.

## 2017-10-20 NOTE — Telephone Encounter (Signed)
Refill Approved

## 2017-11-07 DIAGNOSIS — R1012 Left upper quadrant pain: Secondary | ICD-10-CM | POA: Diagnosis not present

## 2017-11-07 DIAGNOSIS — G894 Chronic pain syndrome: Secondary | ICD-10-CM | POA: Diagnosis not present

## 2017-11-10 ENCOUNTER — Other Ambulatory Visit: Payer: Self-pay | Admitting: Internal Medicine

## 2017-11-10 DIAGNOSIS — Q453 Other congenital malformations of pancreas and pancreatic duct: Secondary | ICD-10-CM

## 2017-12-14 ENCOUNTER — Other Ambulatory Visit: Payer: Self-pay | Admitting: Internal Medicine

## 2017-12-14 DIAGNOSIS — Q453 Other congenital malformations of pancreas and pancreatic duct: Secondary | ICD-10-CM

## 2017-12-14 NOTE — Telephone Encounter (Signed)
Refill Approved

## 2017-12-15 ENCOUNTER — Ambulatory Visit (HOSPITAL_COMMUNITY)
Admission: RE | Admit: 2017-12-15 | Discharge: 2017-12-15 | Disposition: A | Discharge status: Home / Self Care | Payer: Medicare HMO | Source: Ambulatory Visit | Attending: Internal Medicine | Admitting: Internal Medicine

## 2017-12-15 ENCOUNTER — Other Ambulatory Visit: Payer: Self-pay

## 2017-12-15 ENCOUNTER — Ambulatory Visit (INDEPENDENT_AMBULATORY_CARE_PROVIDER_SITE_OTHER): Payer: Medicare HMO | Admitting: Internal Medicine

## 2017-12-15 VITALS — BP 154/79 | HR 87 | Temp 99.0°F | Ht 66.0 in | Wt 105.0 lb

## 2017-12-15 DIAGNOSIS — K861 Other chronic pancreatitis: Secondary | ICD-10-CM | POA: Diagnosis not present

## 2017-12-15 DIAGNOSIS — W19XXXA Unspecified fall, initial encounter: Secondary | ICD-10-CM

## 2017-12-15 DIAGNOSIS — M25531 Pain in right wrist: Secondary | ICD-10-CM | POA: Insufficient documentation

## 2017-12-15 DIAGNOSIS — I10 Essential (primary) hypertension: Secondary | ICD-10-CM

## 2017-12-15 DIAGNOSIS — Z79899 Other long term (current) drug therapy: Secondary | ICD-10-CM | POA: Diagnosis not present

## 2017-12-15 DIAGNOSIS — G8911 Acute pain due to trauma: Secondary | ICD-10-CM | POA: Diagnosis not present

## 2017-12-15 DIAGNOSIS — S6991XA Unspecified injury of right wrist, hand and finger(s), initial encounter: Secondary | ICD-10-CM | POA: Diagnosis not present

## 2017-12-15 MED ORDER — LISINOPRIL-HYDROCHLOROTHIAZIDE 20-12.5 MG PO TABS: 1.0000 | ORAL_TABLET | Freq: Every day | ORAL | 1 refills | Status: DC

## 2017-12-15 MED ORDER — PANCRELIPASE (LIP-PROT-AMYL) 36000-114000 UNITS PO CPEP: ORAL_CAPSULE | ORAL | 0 refills | Status: DC

## 2017-12-15 NOTE — Patient Instructions (Addendum)
Ms. Waiters,   It was a pleasure taking care of you here the clinic today and I am sorry to hear about the achiness you have in your right wrist.  I am putting in an order for an x-ray of your right wrist to look for fractures.  I will give you a call with the results.  For your pain you can try over-the-counter Tylenol and the topical pain cream.  ~Dr. Eileen Stanford

## 2017-12-15 NOTE — Progress Notes (Signed)
CC: Right wrist pain  HPI:  [name] is a 62 y.o. with past medical history as listed below presenting today with right wrist pain after fall.  3 days ago patient slipped and fell on outstretched arm and is not complaining of 8/10 achy pain at the wrist more localized to the thenar eminence pain is worse with flexion of the wrist.  She reports of increased some swelling but denies numbness, tingling or decreased sensation.  Patient has tried Epsom salts and pain wrist and bandaged but has not had relief.  Right wrist pain s/p fall: 3-day history of right wrist pain after a mechanical ground-level fall.  Now complaining of 8/10 achy pain at the wrist with some swelling but denies numbness, tingling or decrease in sensation.  On physical exam, radial pulses were palpable bilaterally.  There was decreased range of motion of the right wrist secondary to pain.  Thenar eminence was mildly swollen and tender to palpation.  She has several risk factors for fractures including her age and low BMI. -X-ray right wrist -Pain management with Tylenol as needed or NSAIDs -We discussed the possibility of splint of her right wrist if x-ray showed fractures.  Hypertension: Long-standing history of hypertension which was previously noted to be well controlled however elevated at her last clinic visit on 08/10/2017.  This was thought to be grief response in the setting of a death of her niece.  She is currently on lisinopril-HCTZ 20-12.5 mg daily and has been given refills today.  Follow-up with PCP  Chronic pancreatitis: History of chronic pancreatitis on Creon.  She is requesting a refill for Creon which has been done so appropriately.  Past Medical History:  Diagnosis Date  . Beta thalassemia ([place])   . Blood dyscrasia    thalasemia, sickle cell trait  . Childhood asthma   . Chronic abdominal pain    Unclear etiology, thought to be due to chronic pancreatitis previously in setting of her  pancreatic divisum - however, EUS and EGD performed at [facility]  (10/05/2011) showing normal esophageall, gastric, duodenal mucosa. EUS showing no pancreatic masses, cysts, or changes of chronic pancreatitis. Biliary system nondilated and had no endosonographic abnormalities.  . CVA (cerebral vascular accident) ([place]) 1993   Per report by patient she had a stroke and prolonged rehab course; still "weak on left side but not much" (03/29/2017)  . Family history of adverse reaction to anesthesia    "mom took a long while to wake up; she was allergic to it" (03/29/2017)  . History of blood transfusion 1980s   "related to minor sickle cell crisis" (03/29/2017)  . HLD (hyperlipidemia) 2009  . Hypertension   . Migraine headache    "q couple years now" (03/29/2017)  . Pancreatic divisum    S/P ERCP with stenting 07/2010 at [facility], then stent removal (08/05/2010)  . Pneumonia    "had it in my teens 3 times" (03/29/2017)  . Pulmonary embolus ([place]) 08/2004   Two areas of V/Q mismatch. Findings compatible  with high probability for pulmonary embolus.; pt was on coumadin for 1 year  . Seizures ([place]) 1993   "after the stroke in 1993; none for years now"  (03/29/2017)  . Sickle cell trait ([place])   . Thalassemia   . Type II diabetes mellitus ([place]) 2010   well controlled  . Uterine cancer ([place])    Review of Systems: As per HPI  Physical Exam:  Vitals:   12/15/17 1035  BP: (!) 154/79  Pulse: 87  Temp: 99 F (37.2 C)  TempSrc: Oral  SpO2: 99%  Weight: 105 lb (47.6 kg)  Height: 5\' 6"  (1.676 m)   Constitutional: In mild distress secondary to wrist pain Cardiovascular: Normal S1 and S2.  No murmurs, gallops, rubs Respiratory: Clear to auscultation bilaterally Extremity: Right wrists not erythematous, tender to palpation, decreased range of motion to flexion and dorsiflexion, no noticeable swelling, radial pulse intact.  Assessment & Plan:   See Encounters Tab for problem based  charting.  Patient discussed with Dr. Lynnae January

## 2017-12-16 ENCOUNTER — Encounter: Payer: Self-pay | Admitting: Internal Medicine

## 2017-12-16 DIAGNOSIS — M25531 Pain in right wrist: Secondary | ICD-10-CM | POA: Insufficient documentation

## 2017-12-16 NOTE — Assessment & Plan Note (Signed)
Chronic pancreatitis: History of chronic pancreatitis on Creon.  She is requesting a refill for Creon which has been done so appropriately.

## 2017-12-16 NOTE — Assessment & Plan Note (Signed)
Hypertension: Long-standing history of hypertension which was previously noted to be well controlled however elevated at her last clinic visit on 08/10/2017.  This was thought to be grief response in the setting of a death of her niece.  She is currently on lisinopril-HCTZ 20-12.5 mg daily and has been given refills today.  Follow-up with PCP

## 2017-12-16 NOTE — Assessment & Plan Note (Signed)
Right wrist pain s/p fall: 3-day history of right wrist pain after a mechanical ground-level fall.  Now complaining of 8/10 achy pain at the wrist with some swelling but denies numbness, tingling or decrease in sensation.  On physical exam, radial pulses were palpable bilaterally.  There was decreased range of motion of the right wrist secondary to pain.  Thenar eminence was mildly swollen and tender to palpation.  She has several risk factors for fractures including her age and low BMI. -X-ray right wrist -Pain management with Tylenol as needed or NSAIDs -We discussed the possibility of splint of her right wrist if x-ray showed fractures.

## 2017-12-18 ENCOUNTER — Encounter: Payer: Self-pay | Admitting: Internal Medicine

## 2017-12-18 NOTE — Progress Notes (Signed)
Internal Medicine Clinic Attending  I saw and evaluated the patient.  I personally confirmed the key portions of the history and exam documented by Dr. Agyei and I reviewed pertinent patient test results.  The assessment, diagnosis, and plan were formulated together and I agree with the documentation in the resident's note.  

## 2017-12-18 NOTE — Progress Notes (Signed)
I have personally reviewed her x-ray of the right wrist and it shows no fractures.  I attempted calling the patient but unfortunately she has the wrong number on file.  I will proceed by sending a letter.

## 2017-12-22 ENCOUNTER — Other Ambulatory Visit: Payer: Self-pay | Admitting: Internal Medicine

## 2017-12-22 DIAGNOSIS — K861 Other chronic pancreatitis: Secondary | ICD-10-CM

## 2017-12-22 NOTE — Telephone Encounter (Signed)
Refill approved.

## 2017-12-22 NOTE — Telephone Encounter (Signed)
Next appt scheduled 9/19 with PCP. 

## 2018-01-03 DIAGNOSIS — Z79899 Other long term (current) drug therapy: Secondary | ICD-10-CM | POA: Diagnosis not present

## 2018-01-03 DIAGNOSIS — Z9889 Other specified postprocedural states: Secondary | ICD-10-CM | POA: Diagnosis not present

## 2018-01-03 DIAGNOSIS — Q453 Other congenital malformations of pancreas and pancreatic duct: Secondary | ICD-10-CM | POA: Diagnosis not present

## 2018-01-03 DIAGNOSIS — K219 Gastro-esophageal reflux disease without esophagitis: Secondary | ICD-10-CM | POA: Diagnosis not present

## 2018-01-03 DIAGNOSIS — Z800 Family history of malignant neoplasm of digestive organs: Secondary | ICD-10-CM | POA: Diagnosis not present

## 2018-01-09 DIAGNOSIS — G8929 Other chronic pain: Secondary | ICD-10-CM | POA: Diagnosis not present

## 2018-01-09 DIAGNOSIS — R1012 Left upper quadrant pain: Secondary | ICD-10-CM | POA: Diagnosis not present

## 2018-01-09 DIAGNOSIS — Z5181 Encounter for therapeutic drug level monitoring: Secondary | ICD-10-CM | POA: Diagnosis not present

## 2018-01-09 DIAGNOSIS — G894 Chronic pain syndrome: Secondary | ICD-10-CM | POA: Diagnosis not present

## 2018-01-09 DIAGNOSIS — Z79899 Other long term (current) drug therapy: Secondary | ICD-10-CM | POA: Diagnosis not present

## 2018-01-18 ENCOUNTER — Other Ambulatory Visit: Payer: Self-pay

## 2018-01-18 ENCOUNTER — Ambulatory Visit (INDEPENDENT_AMBULATORY_CARE_PROVIDER_SITE_OTHER): Payer: Medicare HMO | Admitting: Internal Medicine

## 2018-01-18 ENCOUNTER — Encounter: Payer: Self-pay | Admitting: Internal Medicine

## 2018-01-18 VITALS — BP 128/63 | HR 86 | Temp 98.0°F | Ht 60.0 in | Wt 107.2 lb

## 2018-01-18 DIAGNOSIS — K219 Gastro-esophageal reflux disease without esophagitis: Secondary | ICD-10-CM

## 2018-01-18 DIAGNOSIS — G8929 Other chronic pain: Secondary | ICD-10-CM

## 2018-01-18 DIAGNOSIS — Q453 Other congenital malformations of pancreas and pancreatic duct: Secondary | ICD-10-CM

## 2018-01-18 DIAGNOSIS — M25521 Pain in right elbow: Secondary | ICD-10-CM | POA: Diagnosis not present

## 2018-01-18 DIAGNOSIS — M25561 Pain in right knee: Secondary | ICD-10-CM

## 2018-01-18 DIAGNOSIS — Z7984 Long term (current) use of oral hypoglycemic drugs: Secondary | ICD-10-CM

## 2018-01-18 DIAGNOSIS — Z79891 Long term (current) use of opiate analgesic: Secondary | ICD-10-CM

## 2018-01-18 DIAGNOSIS — M25522 Pain in left elbow: Secondary | ICD-10-CM

## 2018-01-18 DIAGNOSIS — Z23 Encounter for immunization: Secondary | ICD-10-CM | POA: Diagnosis not present

## 2018-01-18 DIAGNOSIS — Z79899 Other long term (current) drug therapy: Secondary | ICD-10-CM

## 2018-01-18 DIAGNOSIS — D561 Beta thalassemia: Secondary | ICD-10-CM | POA: Diagnosis not present

## 2018-01-18 DIAGNOSIS — M25562 Pain in left knee: Secondary | ICD-10-CM | POA: Diagnosis not present

## 2018-01-18 DIAGNOSIS — I10 Essential (primary) hypertension: Secondary | ICD-10-CM | POA: Diagnosis not present

## 2018-01-18 DIAGNOSIS — M2550 Pain in unspecified joint: Secondary | ICD-10-CM

## 2018-01-18 DIAGNOSIS — E119 Type 2 diabetes mellitus without complications: Secondary | ICD-10-CM

## 2018-01-18 DIAGNOSIS — K861 Other chronic pancreatitis: Secondary | ICD-10-CM

## 2018-01-18 LAB — POCT GLYCOSYLATED HEMOGLOBIN (HGB A1C): HEMOGLOBIN A1C: 6.8 % — AB (ref 4.0–5.6)

## 2018-01-18 LAB — GLUCOSE, CAPILLARY: Glucose-Capillary: 218 mg/dL — ABNORMAL HIGH (ref 70–99)

## 2018-01-18 MED ORDER — PANCRELIPASE (LIP-PROT-AMYL) 36000-114000 UNITS PO CPEP: ORAL_CAPSULE | ORAL | 0 refills | Status: DC

## 2018-01-18 MED ORDER — PROMETHAZINE HCL 12.5 MG PO TABS: 12.5000 mg | ORAL_TABLET | Freq: Four times a day (QID) | ORAL | 1 refills | Status: DC | PRN

## 2018-01-18 MED ORDER — RANITIDINE HCL 150 MG PO TABS: ORAL_TABLET | ORAL | 0 refills | Status: DC

## 2018-01-18 NOTE — Progress Notes (Signed)
   CC: Hypertension, Diabetes, Joint pain, chronic pancreatitis, GERD  HPI:  [name] is a 62 y.o. F with PMHx listed below presenting for Hypertension, Diabetes, Joint pain, chronic pancreatitis, GERD. Please see the A&P for the status of the patient's chronic medical problems.   Past Medical History:  Diagnosis Date  . Beta thalassemia ([place])   . Blood dyscrasia    thalasemia, sickle cell trait  . Childhood asthma   . Chronic abdominal pain    Unclear etiology, thought to be due to chronic pancreatitis previously in setting of her pancreatic divisum - however, EUS and EGD performed at [facility]  (10/05/2011) showing normal esophageall, gastric, duodenal mucosa. EUS showing no pancreatic masses, cysts, or changes of chronic pancreatitis. Biliary system nondilated and had no endosonographic abnormalities.  . CVA (cerebral vascular accident) ([place]) 1993   Per report by patient she had a stroke and prolonged rehab course; still "weak on left side but not much" (03/29/2017)  . Family history of adverse reaction to anesthesia    "mom took a long while to wake up; she was allergic to it" (03/29/2017)  . History of blood transfusion 1980s   "related to minor sickle cell crisis" (03/29/2017)  . HLD (hyperlipidemia) 2009  . Hypertension   . Migraine headache    "q couple years now" (03/29/2017)  . Pancreatic divisum    S/P ERCP with stenting 07/2010 at [facility], then stent removal (08/05/2010)  . Pneumonia    "had it in my teens 3 times" (03/29/2017)  . Pulmonary embolus ([place]) 08/2004   Two areas of V/Q mismatch. Findings compatible  with high probability for pulmonary embolus.; pt was on coumadin for 1 year  . Seizures ([place]) 1993   "after the stroke in 1993; none for years now"  (03/29/2017)  . Sickle cell trait ([place])   . Thalassemia   . Type II diabetes mellitus ([place]) 2010   well controlled  . Uterine cancer ([place])    Review of Systems:  Performed and all others  negative.  Physical Exam:  Vitals:   01/18/18 1353  BP: 128/63  Pulse: 86  Temp: 98 F (36.7 C)  TempSrc: Oral  SpO2: 100%  Weight: 107 lb 3.2 oz (48.6 kg)  Height: 5' (1.524 m)   Physical Exam  Constitutional: She appears well-developed and well-nourished. No distress.  Cardiovascular: Normal rate, regular rhythm, normal heart sounds and intact distal pulses.  Pulmonary/Chest: Effort normal and breath sounds normal. No respiratory distress.  Abdominal: Soft. Bowel sounds are normal. She exhibits no distension. There is no tenderness.  Musculoskeletal: She exhibits no edema or deformity.  No swelling or edema of bilateral elbows nor knees. No tenderness to palpation of elbow nor knee joints  Skin: Skin is warm and dry.    Assessment & Plan:   See Encounters Tab for problem based charting.  Patient discussed with Dr. Dareen Piano

## 2018-01-18 NOTE — Patient Instructions (Signed)
Thank you for allowing [name] to care for you  You Blood pressure and Diabetes remains well controlled - Continue current medications  You were provided with refills of your medications today  For your joint pain, if this worsens or fails to improve within a week, please return to clinic.  Please follow up in about 3 months

## 2018-01-19 ENCOUNTER — Encounter: Payer: Self-pay | Admitting: Internal Medicine

## 2018-01-19 DIAGNOSIS — M2550 Pain in unspecified joint: Secondary | ICD-10-CM | POA: Insufficient documentation

## 2018-01-19 HISTORY — DX: Pain in unspecified joint: M25.50

## 2018-01-19 LAB — MICROALBUMIN / CREATININE URINE RATIO
Creatinine, Urine: 47.3 mg/dL
Microalbumin, Urine: <3 ug/mL

## 2018-01-19 NOTE — Assessment & Plan Note (Signed)
BP today 128/64. Patient remains well controlled on current regimen. - Continue Lisinopril-HCTZ 20-12.5mg  Daily

## 2018-01-19 NOTE — Assessment & Plan Note (Signed)
A1c today is 6.8; this is stable from previous of 6.6. She remains well controlled on her current regimen. - Continue Metformin 1000mg  BID - Continue lifestyle modifications

## 2018-01-19 NOTE — Assessment & Plan Note (Signed)
Patient states that her symptoms remain well controlled on ranitidine and is requesting a refill today. - Continue Ranitidine 150mg  BID PRN

## 2018-01-19 NOTE — Assessment & Plan Note (Signed)
Patient has a history of chronic pancreatitis on Creon. She is requesting a refill for Creon and phenergan. - Continue Creon - Continue Phenergan q6h PRN

## 2018-01-19 NOTE — Assessment & Plan Note (Signed)
Patient reports about 2 days of achy 6-7/10 pain her bilateral elbows and knees. She states she has tried OTC pain relievers, Heat, and pain cream with minimal relief. She states it has been exacerbated by house work and other physical activity. She states that the pain is similar to previous pain that she has had, which sh attributes to her beta-thalassemia. She states this pain typically passes spontaneously after 4 or 5 days.  Pain does not sound suspicious for arthritis at this time. Patient denies any trauma. On exam no swelling, tenderness, or redness. Patient advised to continue conservative treatment and return to clinic if pain fails to resolve in the next several days as usual. - Continue conservative therapy - Follow up as needed if pain fails to improve or worsens

## 2018-01-19 NOTE — Assessment & Plan Note (Signed)
Patient continues to follow with [facility] for chronic pain

## 2018-01-22 NOTE — Progress Notes (Signed)
Patient mailed letter informing her of her recent normal results on her microalbumin/cr ratio.

## 2018-01-24 NOTE — Progress Notes (Signed)
Internal Medicine Clinic Attending  Case discussed with Dr. Melvin  at the time of the visit.  We reviewed the resident's history and exam and pertinent patient test results.  I agree with the assessment, diagnosis, and plan of care documented in the resident's note.  

## 2018-02-13 ENCOUNTER — Other Ambulatory Visit: Payer: Self-pay | Admitting: Internal Medicine

## 2018-02-13 DIAGNOSIS — Q453 Other congenital malformations of pancreas and pancreatic duct: Secondary | ICD-10-CM

## 2018-02-13 DIAGNOSIS — K861 Other chronic pancreatitis: Secondary | ICD-10-CM

## 2018-02-13 NOTE — Telephone Encounter (Signed)
Refills approved.

## 2018-02-15 ENCOUNTER — Other Ambulatory Visit: Payer: Self-pay | Admitting: Internal Medicine

## 2018-02-15 NOTE — Telephone Encounter (Signed)
Refused as last refill from 9/16 should last until February 2020. Refill inappropriately requested by [employer].

## 2018-03-21 ENCOUNTER — Other Ambulatory Visit: Payer: Self-pay | Admitting: Internal Medicine

## 2018-03-21 DIAGNOSIS — Q453 Other congenital malformations of pancreas and pancreatic duct: Secondary | ICD-10-CM

## 2018-03-21 NOTE — Telephone Encounter (Signed)
Refill approved.

## 2018-04-03 DIAGNOSIS — Z79899 Other long term (current) drug therapy: Secondary | ICD-10-CM | POA: Diagnosis not present

## 2018-04-03 DIAGNOSIS — G8929 Other chronic pain: Secondary | ICD-10-CM | POA: Diagnosis not present

## 2018-04-03 DIAGNOSIS — G894 Chronic pain syndrome: Secondary | ICD-10-CM | POA: Diagnosis not present

## 2018-04-03 DIAGNOSIS — Z5181 Encounter for therapeutic drug level monitoring: Secondary | ICD-10-CM | POA: Diagnosis not present

## 2018-04-03 DIAGNOSIS — R1012 Left upper quadrant pain: Secondary | ICD-10-CM | POA: Diagnosis not present

## 2018-04-16 ENCOUNTER — Other Ambulatory Visit: Payer: Self-pay | Admitting: Internal Medicine

## 2018-04-16 DIAGNOSIS — Q453 Other congenital malformations of pancreas and pancreatic duct: Secondary | ICD-10-CM

## 2018-04-17 NOTE — Telephone Encounter (Signed)
Refill Approved

## 2018-05-14 ENCOUNTER — Other Ambulatory Visit: Payer: Self-pay | Admitting: Internal Medicine

## 2018-05-14 DIAGNOSIS — Q453 Other congenital malformations of pancreas and pancreatic duct: Secondary | ICD-10-CM

## 2018-05-15 NOTE — Telephone Encounter (Signed)
Refused as this was just refilled yesterday 05/14/18

## 2018-05-17 ENCOUNTER — Ambulatory Visit (INDEPENDENT_AMBULATORY_CARE_PROVIDER_SITE_OTHER): Payer: Medicare Other | Admitting: Internal Medicine

## 2018-05-17 ENCOUNTER — Ambulatory Visit (HOSPITAL_COMMUNITY)
Admission: RE | Admit: 2018-05-17 | Discharge: 2018-05-17 | Disposition: A | Discharge status: Home / Self Care | Payer: Medicare Other | Source: Ambulatory Visit | Attending: Internal Medicine | Admitting: Internal Medicine

## 2018-05-17 ENCOUNTER — Telehealth: Payer: Self-pay | Admitting: Internal Medicine

## 2018-05-17 ENCOUNTER — Encounter: Payer: Self-pay | Admitting: Internal Medicine

## 2018-05-17 ENCOUNTER — Other Ambulatory Visit: Payer: Self-pay

## 2018-05-17 ENCOUNTER — Encounter (INDEPENDENT_AMBULATORY_CARE_PROVIDER_SITE_OTHER): Payer: Self-pay

## 2018-05-17 VITALS — BP 132/80 | HR 87 | Temp 98.0°F | Ht 60.0 in | Wt 111.3 lb

## 2018-05-17 DIAGNOSIS — Z86711 Personal history of pulmonary embolism: Secondary | ICD-10-CM

## 2018-05-17 DIAGNOSIS — M79661 Pain in right lower leg: Secondary | ICD-10-CM

## 2018-05-17 DIAGNOSIS — Z8673 Personal history of transient ischemic attack (TIA), and cerebral infarction without residual deficits: Secondary | ICD-10-CM

## 2018-05-17 DIAGNOSIS — K861 Other chronic pancreatitis: Secondary | ICD-10-CM | POA: Diagnosis not present

## 2018-05-17 DIAGNOSIS — Q453 Other congenital malformations of pancreas and pancreatic duct: Secondary | ICD-10-CM

## 2018-05-17 DIAGNOSIS — E119 Type 2 diabetes mellitus without complications: Secondary | ICD-10-CM

## 2018-05-17 DIAGNOSIS — D561 Beta thalassemia: Secondary | ICD-10-CM

## 2018-05-17 LAB — D-DIMER, QUANTITATIVE (NOT AT ARMC): D DIMER QUANT: 1.08 ug{FEU}/mL — AB (ref 0.00–0.50)

## 2018-05-17 MED ORDER — PROMETHAZINE HCL 12.5 MG PO TABS: ORAL_TABLET | ORAL | 2 refills | Status: DC

## 2018-05-17 NOTE — Patient Instructions (Addendum)
Thank you for allowing [name] to provide your care today. Today we discussed your right calf pain    I have ordered D-dimer lab for you which will tell us if you have a blood clot. I will call if any are abnormal.    Please follow-up if your pain worsens.    Should you have any questions or concerns please call the internal medicine clinic at [phone].     D-Dimer Test Why am I having this test? The D-dimer test is used to help diagnose conditions that cause abnormal or excessive blood clotting, such as thrombosis or disseminated intravascular coagulation (DIC). This test may also be used to monitor treatment for a blood clotting disease. What kind of sample is taken?  A blood sample will be taken. Blood samples are usually collected by inserting a needle into a vein. How do I prepare for this test? There is no preparation required for this test. What are the reference values? Reference values are considered healthy values established after testing a large group of healthy people. The reference value for this test is <0.5 mcg/mL. Reference values may vary among different people, labs, and hospitals. What do the results mean? An elevated D-dimer test result may mean that you have a blood clotting condition, such as:  Deep vein thrombosis (DVT).  Pulmonary embolism (PE).  DIC.  Blood clot breakdown (primary fibrinolysis).  Arterial thromboembolism.  Sickle cell anemia. D-dimer levels can also be elevated for other reasons, including:  Recent surgery.  Certain cancers.  Pregnancy.  Liver disease.  High levels of rheumatoid factor.  Infection. If you are having the D-dimer test to monitor treatment for a blood clotting disease, a decreased level of D-dimer means that the treatment is working. Talk with your health care provider to discuss your results, treatment options, and if necessary, the need for more tests. Talk with your health care provider if you have any questions  about your results. How do I get my results? It is up to you to get your test results. Ask your health care provider, or the department that is doing the test, when your results will be ready. This information is not intended to replace advice given to you by your health care provider. Make sure you discuss any questions you have with your health care provider. Document Released: 05/11/2004 Document Revised: 03/07/2016 Document Reviewed: 03/07/2016 Elsevier Interactive Patient Education  2019 [employer].

## 2018-05-17 NOTE — Telephone Encounter (Signed)
Spoke with [name] about her ultrasound results and informed her about the negative findings. Her leg pain is most likely 2/2 muscular strain and recommended she treat with Rest, Ice, Compression, Elevation,

## 2018-05-17 NOTE — Progress Notes (Signed)
Right lower extremity venous duplex has been completed.   Preliminary results in CV Proc.   Jinny Blossom Azaylia Fong 05/17/2018 1:20 PM

## 2018-05-17 NOTE — Telephone Encounter (Signed)
Spoke with [name] about elevated d-dimer. She states she will come back to hospital for her lower extremity ultrasound.

## 2018-05-17 NOTE — Progress Notes (Signed)
CC: Right calf pain  HPI: [name] is a 63 y.o. F w/ PMH of beta thalassemia, CVA, chronic pancreatitis and T2DM presenting to [facility] for right calf pain. She states she was in her usual state of health until Sunday when she acute onset right calf pain without any obvious inciting event. She states she thought the pain was caused by a bug bite but noticed no rash or bite marks. She states she thought it was a pulled muscle but she had prior hx of pulmonary embolism and came to the clinic to be evaluated for blood clot. She denies any current tobacco use, hormonal therapy, long drives or airplane travel. Denies any recent surgeries or diagnosis of cancer. She denies any leg swelling, chest pain, palpitations, dyspnea or hemoptysis.   Past Medical History:  Diagnosis Date  . Beta thalassemia ([place])   . Blood dyscrasia    thalasemia, sickle cell trait  . Childhood asthma   . Chronic abdominal pain    Unclear etiology, thought to be due to chronic pancreatitis previously in setting of her pancreatic divisum - however, EUS and EGD performed at [facility]  (10/05/2011) showing normal esophageall, gastric, duodenal mucosa. EUS showing no pancreatic masses, cysts, or changes of chronic pancreatitis. Biliary system nondilated and had no endosonographic abnormalities.  . CVA (cerebral vascular accident) ([place]) 1993   Per report by patient she had a stroke and prolonged rehab course; still "weak on left side but not much" (03/29/2017)  . Family history of adverse reaction to anesthesia    "mom took a long while to wake up; she was allergic to it" (03/29/2017)  . History of blood transfusion 1980s   "related to minor sickle cell crisis" (03/29/2017)  . HLD (hyperlipidemia) 2009  . Hypertension   . Migraine headache    "q couple years now" (03/29/2017)  . Palpitation 07/18/2016  . Pancreatic divisum    S/P ERCP with stenting 07/2010 at [facility], then stent removal (08/05/2010)  . Pneumonia     "had it in my teens 3 times" (03/29/2017)  . Pulmonary embolus ([place]) 08/2004   Two areas of V/Q mismatch. Findings compatible  with high probability for pulmonary embolus.; pt was on coumadin for 1 year  . Seizures ([place]) 1993   "after the stroke in 1993; none for years now"  (03/29/2017)  . Sickle cell trait ([place])   . Thalassemia   . Type II diabetes mellitus ([place]) 2010   well controlled  . Uterine cancer ([place])    Review of Systems: Review of Systems  Constitutional: Negative for chills, fever, malaise/fatigue and weight loss.  Eyes: Negative for blurred vision.  Respiratory: Negative for hemoptysis and shortness of breath.   Cardiovascular: Negative for chest pain and palpitations.  Gastrointestinal: Negative for constipation, diarrhea, nausea and vomiting.  Musculoskeletal: Positive for joint pain. Negative for back pain and falls.  Neurological: Negative for headaches.     Physical Exam: Vitals:   05/17/18 0849 05/17/18 0910  BP: (!) 160/85 132/80  Pulse: 87   Temp: 98 F (36.7 C)   TempSrc: Oral   SpO2: 100%   Weight: 111 lb 4.8 oz (50.5 kg)   Height: 5' (1.524 m)     Physical Exam  Constitutional: She is oriented to person, place, and time. She appears well-developed and well-nourished. No distress.  HENT:  Mouth/Throat: Oropharynx is clear and moist. No oropharyngeal exudate.  Eyes: Conjunctivae are normal. No scleral icterus.  Neck: Normal range of motion. Neck  supple. No JVD present.  Cardiovascular: Normal rate, regular rhythm and intact distal pulses.  No murmur heard. Respiratory: Effort normal and breath sounds normal. She has no wheezes. She has no rales.  GI: Soft. Bowel sounds are normal. There is no abdominal tenderness.  Musculoskeletal: Normal range of motion.        General: Tenderness (Point tenderness to palpation on Right lateral calf) present. No edema.  Neurological: She is alert and oriented to person, place, and time.  Skin: Skin is warm  and dry.     Assessment & Plan:   Congenital anomaly of pancreas Pt requires refills on medications with associated diagnosis above.  Reviewed disease process and find this medication to be necessary, will not change dose or alter current therapy.  - Refilled promethazine 12.5mg  PRN for nausea  Right calf pain Presenting with acute onset right calf pain without inciting event. Patient thought MSK in nature but wanted to get it checked due to prior hx of PE. 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 She denies any hormonal therapy, smoking use, long-distance travel, recent surgery. As far as she knows, she does not have any diagnosis of cancer. On physical exam showed point tenderness to palpation on right lateral calf w/o difference in leg diameter or significant swelling. No other tenderness along the leg. Wells Score 1 2/2 prior hx. Will get d-dimer  - D-dimer today  Addendum: D-dimer + @ 1.08, will call pt to come for DVT ultrasound    Patient discussed with Dr. Dareen Piano   -Gilberto Better, PGY1

## 2018-05-18 ENCOUNTER — Encounter: Payer: Self-pay | Admitting: Internal Medicine

## 2018-05-18 DIAGNOSIS — M79661 Pain in right lower leg: Secondary | ICD-10-CM | POA: Insufficient documentation

## 2018-05-18 HISTORY — DX: Pain in right lower leg: M79.661

## 2018-05-18 NOTE — Assessment & Plan Note (Addendum)
Presenting with acute onset right calf pain without inciting event. Patient thought MSK in nature but wanted to get it checked due to prior hx of PE. 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 She denies any hormonal therapy, smoking use, long-distance travel, recent surgery. As far as she knows, she does not have any diagnosis of cancer. On physical exam showed point tenderness to palpation on right lateral calf w/o difference in leg diameter or significant swelling. No other tenderness along the leg. Wells Score 1 2/2 prior hx. Will get d-dimer  - D-dimer today  Addendum: D-dimer + @ 1.08, will call pt to come for DVT ultrasound

## 2018-05-18 NOTE — Assessment & Plan Note (Signed)
Pt requires refills on medications with associated diagnosis above.  Reviewed disease process and find this medication to be necessary, will not change dose or alter current therapy.  - Refilled promethazine 12.5mg  PRN for nausea

## 2018-05-18 NOTE — Progress Notes (Signed)
Internal Medicine Clinic Attending ° °Case discussed with Dr. Lee at the time of the visit.  We reviewed the resident’s history and exam and pertinent patient test results.  I agree with the assessment, diagnosis, and plan of care documented in the resident’s note.  °

## 2018-06-11 ENCOUNTER — Other Ambulatory Visit: Payer: Self-pay | Admitting: Internal Medicine

## 2018-06-11 DIAGNOSIS — Q453 Other congenital malformations of pancreas and pancreatic duct: Secondary | ICD-10-CM

## 2018-06-11 DIAGNOSIS — G894 Chronic pain syndrome: Secondary | ICD-10-CM | POA: Diagnosis not present

## 2018-06-11 DIAGNOSIS — R1012 Left upper quadrant pain: Secondary | ICD-10-CM | POA: Diagnosis not present

## 2018-06-11 DIAGNOSIS — G8929 Other chronic pain: Secondary | ICD-10-CM | POA: Diagnosis not present

## 2018-06-13 ENCOUNTER — Other Ambulatory Visit: Payer: Self-pay

## 2018-06-13 DIAGNOSIS — Q453 Other congenital malformations of pancreas and pancreatic duct: Secondary | ICD-10-CM

## 2018-06-13 MED ORDER — PROMETHAZINE HCL 12.5 MG PO TABS: ORAL_TABLET | ORAL | 0 refills | Status: DC

## 2018-06-13 NOTE — Telephone Encounter (Signed)
promethazine (PHENERGAN) 12.5 MG tablet, Refill request @  [facility] [place], [place] [phone] (Phone) [phone] (Fax)

## 2018-06-13 NOTE — Telephone Encounter (Signed)
Called pt she will see dr Trilby Drummer 2/13 at 1315

## 2018-06-13 NOTE — Telephone Encounter (Signed)
Pt has called for a refill of promethazine, called pharm spoke to pharmacist, pt has filled all 3 scripts sent in 1/16 1/16 #30 1/23 #30 1/31 #30 im sure you meant for each to last 30 days but did not add this to script to only fill every 30 days.  Please advise

## 2018-06-13 NOTE — Telephone Encounter (Signed)
I'm with her PCP, Dr.Melvin and he is recommending I about a month's supply and have her follow up with him. He is worried about her QT prolongation and would like to have a follow up with him when possible. I'll send a 90 pill supply with no refill.

## 2018-06-14 ENCOUNTER — Ambulatory Visit (INDEPENDENT_AMBULATORY_CARE_PROVIDER_SITE_OTHER): Payer: Medicare Other | Admitting: Internal Medicine

## 2018-06-14 ENCOUNTER — Encounter: Payer: Self-pay | Admitting: Internal Medicine

## 2018-06-14 ENCOUNTER — Other Ambulatory Visit: Payer: Self-pay

## 2018-06-14 ENCOUNTER — Ambulatory Visit (HOSPITAL_COMMUNITY)
Admission: RE | Admit: 2018-06-14 | Discharge: 2018-06-14 | Disposition: A | Discharge status: Home / Self Care | Payer: Medicare Other | Source: Ambulatory Visit | Attending: Student in an Organized Health Care Education/Training Program | Admitting: Student in an Organized Health Care Education/Training Program

## 2018-06-14 VITALS — BP 137/71 | HR 83 | Temp 97.9°F | Ht 60.0 in | Wt 112.4 lb

## 2018-06-14 DIAGNOSIS — Z1159 Encounter for screening for other viral diseases: Secondary | ICD-10-CM

## 2018-06-14 DIAGNOSIS — Z7984 Long term (current) use of oral hypoglycemic drugs: Secondary | ICD-10-CM

## 2018-06-14 DIAGNOSIS — Z0000 Encounter for general adult medical examination without abnormal findings: Secondary | ICD-10-CM

## 2018-06-14 DIAGNOSIS — E119 Type 2 diabetes mellitus without complications: Secondary | ICD-10-CM | POA: Diagnosis not present

## 2018-06-14 DIAGNOSIS — E785 Hyperlipidemia, unspecified: Secondary | ICD-10-CM

## 2018-06-14 DIAGNOSIS — D509 Iron deficiency anemia, unspecified: Secondary | ICD-10-CM

## 2018-06-14 DIAGNOSIS — Z8639 Personal history of other endocrine, nutritional and metabolic disease: Secondary | ICD-10-CM | POA: Diagnosis not present

## 2018-06-14 DIAGNOSIS — K861 Other chronic pancreatitis: Secondary | ICD-10-CM | POA: Insufficient documentation

## 2018-06-14 DIAGNOSIS — Z79899 Other long term (current) drug therapy: Secondary | ICD-10-CM

## 2018-06-14 DIAGNOSIS — I491 Atrial premature depolarization: Secondary | ICD-10-CM

## 2018-06-14 DIAGNOSIS — D573 Sickle-cell trait: Secondary | ICD-10-CM

## 2018-06-14 LAB — POCT GLYCOSYLATED HEMOGLOBIN (HGB A1C): Hemoglobin A1C: 6.8 % — AB (ref 4.0–5.6)

## 2018-06-14 LAB — GLUCOSE, CAPILLARY: Glucose-Capillary: 134 mg/dL — ABNORMAL HIGH (ref 70–99)

## 2018-06-14 NOTE — Progress Notes (Addendum)
   CC: Diabetes, Chronic Pancreatitis, HLD, Preventative healthcare  HPI:  [name] is a 63 y.o. F with PMHx listed below presenting for Diabetes, Chronic Pancreatitis, HLD, Preventative healthcare. Please see the A&P for the status of the patient's chronic medical problems.   Past Medical History:  Diagnosis Date  . Beta thalassemia ([place])   . Blood dyscrasia    thalasemia, sickle cell trait  . Childhood asthma   . Chronic abdominal pain    Unclear etiology, thought to be due to chronic pancreatitis previously in setting of her pancreatic divisum - however, EUS and EGD performed at [facility]  (10/05/2011) showing normal esophageall, gastric, duodenal mucosa. EUS showing no pancreatic masses, cysts, or changes of chronic pancreatitis. Biliary system nondilated and had no endosonographic abnormalities.  . CVA (cerebral vascular accident) ([place]) 1993   Per report by patient she had a stroke and prolonged rehab course; still "weak on left side but not much" (03/29/2017)  . Family history of adverse reaction to anesthesia    "mom took a long while to wake up; she was allergic to it" (03/29/2017)  . History of blood transfusion 1980s   "related to minor sickle cell crisis" (03/29/2017)  . HLD (hyperlipidemia) 2009  . Hypertension   . Migraine headache    "q couple years now" (03/29/2017)  . Palpitation 07/18/2016  . Pancreatic divisum    S/P ERCP with stenting 07/2010 at [facility], then stent removal (08/05/2010)  . Pneumonia    "had it in my teens 3 times" (03/29/2017)  . Pulmonary embolus ([place]) 08/2004   Two areas of V/Q mismatch. Findings compatible  with high probability for pulmonary embolus.; pt was on coumadin for 1 year  . Seizures ([place]) 1993   "after the stroke in 1993; none for years now"  (03/29/2017)  . Sickle cell trait ([place])   . Thalassemia   . Type II diabetes mellitus ([place]) 2010   well controlled  . Uterine cancer ([place])    Review of Systems:  Performed  and all others negative.  Physical Exam:   Vitals:   06/14/18 1321  BP: 137/71  Pulse: 83  Temp: 97.9 F (36.6 C)  TempSrc: Oral  SpO2: 99%  Weight: 112 lb 6.4 oz (51 kg)  Height: 5' (1.524 m)   Physical Exam Constitutional:      General: She is not in acute distress.    Appearance: Normal appearance.  Cardiovascular:     Rate and Rhythm: Normal rate and regular rhythm.     Pulses: Normal pulses.     Heart sounds: Normal heart sounds.  Pulmonary:     Effort: Pulmonary effort is normal. No respiratory distress.     Breath sounds: Normal breath sounds.  Abdominal:     General: Bowel sounds are normal. There is no distension.     Palpations: Abdomen is soft.     Tenderness: There is no abdominal tenderness.  Musculoskeletal:        General: No swelling or deformity.  Skin:    General: Skin is warm and dry.  Neurological:     General: No focal deficit present.     Mental Status: Mental status is at baseline.    Assessment & Plan:   See Encounters Tab for problem based charting.  Patient discussed with Dr. Evette Doffing

## 2018-06-14 NOTE — Assessment & Plan Note (Signed)
Screening lab for Hep C today. Also rechecking CBC and BMP as it has been about 1 yr since last labs.

## 2018-06-14 NOTE — Assessment & Plan Note (Addendum)
Patient has chronic pancreatitis and is on creon and PRN phenergan. She follows with [facility], Last seen in Nov 2019. She needs regulat refills of her phenergan. She has not had an EKG in about 2 years. Will repeat to evaluate QT interval. Repeat EKG today shows: Sinus rhythm, PACs, and QTc 439. - Continue Creon - Continue Phenergan q6h PRN

## 2018-06-14 NOTE — Assessment & Plan Note (Signed)
Patient has a history of hyperlipidemia and was on pravastatin in the past. When discussing her previous medication use she states she was told to stop by her doctor, but could not remember why. On further questioning, she states she did have problems with significant arthralgias and that's why the medications was stopped.

## 2018-06-14 NOTE — Patient Instructions (Addendum)
Thank you for allowing [name] to care for you  Your Diabetes and high blood pressure remain well controlled - Continue current medications  Your EKG today looked good and it is safe to continue with your nausea medication  Labs today: you will be contacted with the results  Please schedule your eye appointment  Please follow up in about 6 months

## 2018-06-14 NOTE — Assessment & Plan Note (Addendum)
A1 today is 6.8, this is stable from previous of 6.8. She states she will schedule appointment with her eye doctor for exam. - Metformin 1000mg  BID - Lifestyle modifications - Patient to schedule diabetic eye exam

## 2018-06-15 ENCOUNTER — Encounter: Payer: Self-pay | Admitting: Internal Medicine

## 2018-06-15 LAB — BMP8+ANION GAP
ANION GAP: 20 mmol/L — AB (ref 10.0–18.0)
BUN / CREAT RATIO: 17 (ref 12–28)
BUN: 12 mg/dL (ref 8–27)
CALCIUM: 10.3 mg/dL (ref 8.7–10.3)
CHLORIDE: 97 mmol/L (ref 96–106)
CO2: 22 mmol/L (ref 20–29)
Creatinine, Ser: 0.7 mg/dL (ref 0.57–1.00)
GFR calc Af Amer: 106 mL/min/{1.73_m2} (ref >59)
GFR calc non Af Amer: 92 mL/min/{1.73_m2} (ref >59)
GLUCOSE: 80 mg/dL (ref 65–99)
Potassium: 3.9 mmol/L (ref 3.5–5.2)
Sodium: 139 mmol/L (ref 134–144)

## 2018-06-15 LAB — CBC
HEMOGLOBIN: 11.8 g/dL (ref 11.1–15.9)
Hematocrit: 35.9 % (ref 34.0–46.6)
MCH: 26 pg — ABNORMAL LOW (ref 26.6–33.0)
MCHC: 32.9 g/dL (ref 31.5–35.7)
MCV: 79 fL (ref 79–97)
Platelets: 425 10*3/uL (ref 150–450)
RBC: 4.53 x10E6/uL (ref 3.77–5.28)
RDW: 14.4 % (ref 11.7–15.4)
WBC: 7.4 10*3/uL (ref 3.4–10.8)

## 2018-06-15 LAB — HEPATITIS C ANTIBODY: Hep C Virus Ab: <0.1 s/co ratio (ref 0.0–0.9)

## 2018-06-15 NOTE — Progress Notes (Signed)
Internal Medicine Clinic Attending  Case discussed with Dr. Melvin  at the time of the visit.  We reviewed the resident's history and exam and pertinent patient test results.  I agree with the assessment, diagnosis, and plan of care documented in the resident's note.  

## 2018-06-15 NOTE — Progress Notes (Signed)
Patient sent a letter informing her of her normal labs results (CBC, BMP, HepC screen).  Pearson Grippe, DO IM PGY-2

## 2018-07-12 ENCOUNTER — Other Ambulatory Visit: Payer: Self-pay | Admitting: Internal Medicine

## 2018-07-12 DIAGNOSIS — Q453 Other congenital malformations of pancreas and pancreatic duct: Secondary | ICD-10-CM

## 2018-07-25 ENCOUNTER — Other Ambulatory Visit: Payer: Self-pay | Admitting: Internal Medicine

## 2018-07-25 DIAGNOSIS — K219 Gastro-esophageal reflux disease without esophagitis: Secondary | ICD-10-CM

## 2018-07-26 NOTE — Telephone Encounter (Signed)
Refill approved.

## 2018-08-09 ENCOUNTER — Other Ambulatory Visit: Payer: Self-pay | Admitting: Internal Medicine

## 2018-08-09 DIAGNOSIS — Q453 Other congenital malformations of pancreas and pancreatic duct: Secondary | ICD-10-CM

## 2018-08-10 NOTE — Telephone Encounter (Signed)
Refill approved.

## 2018-09-19 ENCOUNTER — Other Ambulatory Visit: Payer: Self-pay

## 2018-09-19 ENCOUNTER — Encounter: Payer: Self-pay | Admitting: Internal Medicine

## 2018-09-19 ENCOUNTER — Ambulatory Visit (INDEPENDENT_AMBULATORY_CARE_PROVIDER_SITE_OTHER): Payer: Medicare Other | Admitting: Internal Medicine

## 2018-09-19 DIAGNOSIS — M79601 Pain in right arm: Secondary | ICD-10-CM

## 2018-09-19 DIAGNOSIS — M778 Other enthesopathies, not elsewhere classified: Secondary | ICD-10-CM

## 2018-09-19 DIAGNOSIS — M7501 Adhesive capsulitis of right shoulder: Secondary | ICD-10-CM | POA: Insufficient documentation

## 2018-09-19 DIAGNOSIS — M7551 Bursitis of right shoulder: Secondary | ICD-10-CM | POA: Insufficient documentation

## 2018-09-19 NOTE — Assessment & Plan Note (Signed)
Patient presents for evaluation of right upper arm pain. The first time that she noticed the pain was in the morning a few days ago, she thought she may have slept on the arm the wrong way. She describes doing a lot of house chores like sweeping and raking the day before. The pain is over the posterior lateral aspect of her right arm and radiates down to the middle of her upper arm. She does not have shoulder pain. She has pain with moving arm especially backward or outward but does not have pain with overhead maneuvers. No issue with the left arm. She has no upper extremity weakness, no radiating numbness or tingling, no neck pain. No recent over head activities, no trauma. No fever, fatigue, swelling in other joints.   A: Likely triceps muscle tendinitis from overuse.   P: rest, ice, NSAID/tylenol therapy

## 2018-09-19 NOTE — Progress Notes (Signed)
CC: arm pain   HPI:  [name] is a 63 y.o. with PMH as listed below who presents for evaluation of arm pain. Please see the assessment and plans for the status of the patient chronic medical problems.   Past Medical History:  Diagnosis Date  . Beta thalassemia ([place])   . Blood dyscrasia    thalasemia, sickle cell trait  . Childhood asthma   . Chronic abdominal pain    Unclear etiology, thought to be due to chronic pancreatitis previously in setting of her pancreatic divisum - however, EUS and EGD performed at [facility]  (10/05/2011) showing normal esophageall, gastric, duodenal mucosa. EUS showing no pancreatic masses, cysts, or changes of chronic pancreatitis. Biliary system nondilated and had no endosonographic abnormalities.  . CVA (cerebral vascular accident) ([place]) 1993   Per report by patient she had a stroke and prolonged rehab course; still "weak on left side but not much" (03/29/2017)  . Family history of adverse reaction to anesthesia    "mom took a long while to wake up; she was allergic to it" (03/29/2017)  . History of blood transfusion 1980s   "related to minor sickle cell crisis" (03/29/2017)  . HLD (hyperlipidemia) 2009  . Hypertension   . Migraine headache    "q couple years now" (03/29/2017)  . Palpitation 07/18/2016  . Pancreatic divisum    S/P ERCP with stenting 07/2010 at [facility], then stent removal (08/05/2010)  . Pneumonia    "had it in my teens 3 times" (03/29/2017)  . Pulmonary embolus ([place]) 08/2004   Two areas of V/Q mismatch. Findings compatible  with high probability for pulmonary embolus.; pt was on coumadin for 1 year  . Seizures ([place]) 1993   "after the stroke in 1993; none for years now"  (03/29/2017)  . Sickle cell trait ([place])   . Thalassemia   . Type II diabetes mellitus ([place]) 2010   well controlled  . Uterine cancer ([place])    Review of Systems:  Refer to history of present illness and assessment and plans for pertinent review  of systems, all others reviewed and negative  Physical Exam:  Vitals:   09/19/18 1401  BP: 133/61  Pulse: (!) 104  Temp: 98.2 F (36.8 C)  TempSrc: Oral  SpO2: 99%  Weight: 112 lb 6.4 oz (51 kg)  Height: 5' (1.524 m)   General: well appearing, not in acute distress  MSK: No warmth, swelling or tenderness of the right shoulder. Passive extension of the right shoulder is limited by pain. Normal strength of proximal and distal muscle groups of bl upper extremity. No pain illicited with rotator cuff maneuvers or passive internal and external rotation of the shoulder.   Assessment & Plan:   Triceps Tendinitis  Patient presents for evaluation of right upper arm pain. The first time that she noticed the pain was in the morning a few days ago, she thought she may have slept on the arm the wrong way. She describes doing a lot of house chores like sweeping and raking the day before. The pain is over the posterior lateral aspect of her right arm and radiates down to the middle of her upper arm. She does not have shoulder pain. She has pain with moving arm especially backward or outward but does not have pain with overhead maneuvers. No issue with the left arm. She has no upper extremity weakness, no radiating numbness or tingling, no neck pain. No recent over head activities, no trauma. No fever,  fatigue, swelling in other joints. Shes tried heat and aleve at home and the pain has been responding well to those.   A: Likely triceps muscle tendinitis from overuse.   P: rest, ice, NSAID/tylenol therapy   See Encounters Tab for problem based charting.  Patient discussed with Dr. Dareen Piano

## 2018-09-19 NOTE — Patient Instructions (Addendum)
Thank you for coming to the clinic today. It was a pleasure to see you.   You have likely strained your triceps muscle while doing house work. The best thing to do for this injury is rest, ice, and NSAIDs like aleve, or tylenol.   Please call and let us know if the pain does not continue to improve   Please call the internal medicine center clinic if you have any questions or concerns, we may be able to help and keep you from a long and expensive emergency room wait. Our clinic and after hours phone number is [phone], the best time to call is Monday through Friday 9 am to 4 pm but there is always someone available 24/7 if you have an emergency. If you need medication refills please notify your pharmacy one week in advance and they will send [name] a request.

## 2018-09-20 NOTE — Progress Notes (Signed)
Internal Medicine Clinic Attending  Case discussed with Dr. Blum at the time of the visit.  We reviewed the resident's history and exam and pertinent patient test results.  I agree with the assessment, diagnosis, and plan of care documented in the resident's note. 

## 2018-10-13 ENCOUNTER — Other Ambulatory Visit: Payer: Self-pay | Admitting: Internal Medicine

## 2018-10-13 DIAGNOSIS — E119 Type 2 diabetes mellitus without complications: Secondary | ICD-10-CM

## 2018-10-15 NOTE — Telephone Encounter (Signed)
Refill approved.

## 2018-11-06 ENCOUNTER — Other Ambulatory Visit: Payer: Self-pay

## 2018-11-06 ENCOUNTER — Ambulatory Visit (INDEPENDENT_AMBULATORY_CARE_PROVIDER_SITE_OTHER): Payer: Medicare Other | Admitting: Internal Medicine

## 2018-11-06 VITALS — BP 177/95 | HR 91 | Temp 98.8°F | Ht 60.0 in | Wt 113.3 lb

## 2018-11-06 DIAGNOSIS — Q453 Other congenital malformations of pancreas and pancreatic duct: Secondary | ICD-10-CM

## 2018-11-06 DIAGNOSIS — K861 Other chronic pancreatitis: Secondary | ICD-10-CM

## 2018-11-06 DIAGNOSIS — M79601 Pain in right arm: Secondary | ICD-10-CM

## 2018-11-06 DIAGNOSIS — Z79899 Other long term (current) drug therapy: Secondary | ICD-10-CM

## 2018-11-06 DIAGNOSIS — M7551 Bursitis of right shoulder: Secondary | ICD-10-CM

## 2018-11-06 DIAGNOSIS — M778 Other enthesopathies, not elsewhere classified: Secondary | ICD-10-CM

## 2018-11-06 MED ORDER — PROMETHAZINE HCL 12.5 MG PO TABS
ORAL_TABLET | ORAL | 0 refills | Status: DC
Start: 2018-11-06 — End: 2018-11-19

## 2018-11-06 NOTE — Patient Instructions (Addendum)
It was our pleasure taking care of you in our clinic today.  You were seen due to worsening of right arm pain. You might have inflammation (bursitis) or tearing/injury of your tendons. I send referral to orthopedics and they will call you for the appointment. Please follow with them. You can take Ibuprofen or Naproxen 1-2 tablets twice a day, meanwhile as needed for pain. I also send refill for your nausea medicine. I do not make any changes to your medications today. Follow up in clinic as needed. Thanks, Dr. Linna Hoff

## 2018-11-06 NOTE — Assessment & Plan Note (Addendum)
Patient presented for worsening of right arm pain.    Mentions that since last visit, Tylenol and ice/heat pad helped with pain initially but pain was not resolved completely and got very worse today after she slipped on a wet floor. The pain is mostly at lateral side of her right arm. Any movement makes the pain worse. No weakness or numbness reported.  She denies any fever or chills.    Does not report any swelling or redness on the area.   On exam: There is no erythema, swelling on right shoulder or arm.  No tenderness on right shoulder or right arm.  However passive and active range of motion of shoulder is very painful for her.  (Specifically she has pain with right shoulder flexion more than ~80 degrees and right shoulder abduction more than ~ 80 degrees.  She cannot do internal and external rotation of shoulder due to major pain.  Sensation is intact.  Elbow range of motion is intact.  Her symptoms and exams finding are suggestive of subacromial bursitis vs rotator cuff injury/tearing. (Currently, biceps and triceps muscles motions are complete and without pain). Will refer patient to ortho at this point. (Also offered steroid injection today but she prefers to get further work up and referral)  -Placed referral to Orthopedics -Can use NSAID twice daily PRN meanwhile  -F/u in clinic as needed

## 2018-11-06 NOTE — Assessment & Plan Note (Signed)
-  Sent refill for Phenergan (PRN) today

## 2018-11-06 NOTE — Progress Notes (Signed)
CC: Right arm pain  HPI:  [name] is a 63 y.o. with PMHx as documented below, presented with worsening of right arm pain.  Please refer to problem based charting for further details and assessment of plan of current problem and chronic medical conditions.   Past Medical History:  Diagnosis Date  . Beta thalassemia ([place])   . Blood dyscrasia    thalasemia, sickle cell trait  . Childhood asthma   . Chronic abdominal pain    Unclear etiology, thought to be due to chronic pancreatitis previously in setting of her pancreatic divisum - however, EUS and EGD performed at [facility]  (10/05/2011) showing normal esophageall, gastric, duodenal mucosa. EUS showing no pancreatic masses, cysts, or changes of chronic pancreatitis. Biliary system nondilated and had no endosonographic abnormalities.  . CVA (cerebral vascular accident) ([place]) 1993   Per report by patient she had a stroke and prolonged rehab course; still "weak on left side but not much" (03/29/2017)  . Family history of adverse reaction to anesthesia    "mom took a long while to wake up; she was allergic to it" (03/29/2017)  . History of blood transfusion 1980s   "related to minor sickle cell crisis" (03/29/2017)  . HLD (hyperlipidemia) 2009  . Hypertension   . Migraine headache    "q couple years now" (03/29/2017)  . Palpitation 07/18/2016  . Pancreatic divisum    S/P ERCP with stenting 07/2010 at [facility], then stent removal (08/05/2010)  . Pneumonia    "had it in my teens 3 times" (03/29/2017)  . Pulmonary embolus ([place]) 08/2004   Two areas of V/Q mismatch. Findings compatible  with high probability for pulmonary embolus.; pt was on coumadin for 1 year  . Seizures ([place]) 1993   "after the stroke in 1993; none for years now"  (03/29/2017)  . Sickle cell trait ([place])   . Thalassemia   . Type II diabetes mellitus ([place]) 2010   well controlled  . Uterine cancer ([place])    Review of Systems:   Review of Systems   Constitutional: Negative for chills, fever and weight loss.  Musculoskeletal: Positive for myalgias.  Skin: Negative for rash.    Physical Exam:  Vitals:   11/06/18 0836 11/06/18 0845  BP: (!) 147/102 (!) 177/95  Pulse: 90 91  Temp: 98.8 F (37.1 C)   TempSrc: Oral   SpO2: 100%   Weight: 113 lb 4.8 oz (51.4 kg)   Height: 5' (1.524 m)    Physical Exam Vitals signs reviewed.  Constitutional:      General: She is not in acute distress. HENT:     Head: Normocephalic and atraumatic.  Eyes:     Extraocular Movements: Extraocular movements intact.  Cardiovascular:     Rate and Rhythm: Normal rate and regular rhythm.     Heart sounds: No murmur.  Pulmonary:     Effort: Pulmonary effort is normal.     Breath sounds: Normal breath sounds. No wheezing or rales.  Musculoskeletal:        General: No swelling.     Comments: Right arm: No swelling. No tenderness. ROM is limited due to pain. (Pain with flex and abduction>~80 degree. Internal and external rotation)  ROM of right elbow is intact.  Sensation is intact.  Skin:    General: Skin is warm and dry.  Neurological:     General: No focal deficit present.     Mental Status: She is alert and oriented to person, place, and  time.  Psychiatric:     Comments: Becomes tearful when has pain during the exam      Assessment & Plan:   See Encounters Tab for problem based charting.  Patient discussed with Dr. Angelia Mould

## 2018-11-13 NOTE — Progress Notes (Signed)
Internal Medicine Clinic Attending  Case discussed with Dr. Masoudi  at the time of the visit.  We reviewed the resident's history and exam and pertinent patient test results.  I agree with the assessment, diagnosis, and plan of care documented in the resident's note.  

## 2018-11-14 ENCOUNTER — Ambulatory Visit (INDEPENDENT_AMBULATORY_CARE_PROVIDER_SITE_OTHER): Payer: Medicare Other | Admitting: Family Medicine

## 2018-11-14 ENCOUNTER — Encounter: Payer: Self-pay | Admitting: Family Medicine

## 2018-11-14 ENCOUNTER — Other Ambulatory Visit: Payer: Self-pay

## 2018-11-14 DIAGNOSIS — M25511 Pain in right shoulder: Secondary | ICD-10-CM

## 2018-11-14 MED ORDER — NABUMETONE 750 MG PO TABS: 750.0000 mg | ORAL_TABLET | Freq: Two times a day (BID) | ORAL | 6 refills | Status: DC | PRN

## 2018-11-14 NOTE — Progress Notes (Signed)
Office Visit Note   Patient: [name]           Date of Birth: 1955/10/04           MRN: [id number] Visit Date: 11/14/2018 Requested by: Lucious Groves, DO [address]  [place],  [place] [address] PCP: Neva Seat, MD  Subjective: Chief Complaint  Patient presents with  . Right Upper Arm - Pain    Pain x a couple weeks, not getting better.NKI. Did fall in her kitchen last week, but was hurting before that. Decreased ROM.     HPI: She is a 63 year old right-hand-dominant diabetic with right shoulder/arm pain.  Symptoms started about a month ago, no injury.  She woke up 1 day with pain and it has not quit hurting since then.  She has tried over-the-counter remedies with minimal improvement.  Pain with any movement of her shoulder.  Denies any numbness or tingling.  Her diabetes has been fairly well controlled with A1c of 6.8 in February.  She is never had problems with her arm before.               ROS: Denies fevers or chills.  All other systems were reviewed and are negative.  Objective: Vital Signs: There were no vitals taken for this visit.  Physical Exam:  General:  Alert and oriented, in no acute distress. Pulm:  Breathing unlabored. Psy:  Normal mood, congruent affect. Skin: No rash on her skin. Right shoulder: Very limited range of motion with abduction 60 degrees, external rotation 20 degrees and internal rotation 20 degrees.  Pain at the extreme of passive and active range of motion.  Isometric rotator cuff strength seems to be intact, with minimal discomfort on empty can testing.  No tenderness at the [facility] joint or the long head biceps tendon.  Imaging: None today.  Assessment & Plan: 1.  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 -Trial of physical therapy.  Follow-up in 3 to 4 weeks, if not improving then possibly glenohumeral injection under ultrasound guidance.     Procedures: No procedures performed   No notes on file     PMFS History: Patient Active Problem List   Diagnosis Date Noted  . Bursitis of right shoulder 09/19/2018  . Right calf pain 05/18/2018  . Arthralgia 01/19/2018  . Grief reaction 02/24/2017  . Encounter for screening mammogram for breast cancer 12/02/2016  . Chronic pancreatitis ([place]) 08/31/2016  . Colon cancer screening 05/23/2016  . Opioid dependence ([place]) 04/28/2016  . Sickle cell trait ([place]) 11/16/2011  . Seizure disorder (history after stroke last sz late 1990s) 11/16/2011  . Beta thalassemia ([place]) 11/16/2011  . History of asthma 11/16/2011  . Iron deficiency anemia 11/16/2011  . Chronic abdominal pain   . Preventative health care 07/20/2010  . GERD 08/03/2007  . Congenital anomaly of pancreas 02/28/2006  . Hyperlipidemia 02/06/2006  . Essential hypertension 02/06/2006  . Diabetes type 2, controlled ([place]) 02/07/1992   Past Medical History:  Diagnosis Date  . Beta thalassemia ([place])   . Blood dyscrasia    thalasemia, sickle cell trait  . Childhood asthma   . Chronic abdominal pain    Unclear etiology, thought to be due to chronic pancreatitis previously in setting of her pancreatic divisum - however, EUS and EGD performed at [facility]  (10/05/2011) showing normal esophageall, gastric, duodenal mucosa. EUS showing no pancreatic masses, cysts, or changes of chronic pancreatitis. Biliary system nondilated and had no endosonographic  abnormalities.  . CVA (cerebral vascular accident) ([place]) 1993   Per report by patient she had a stroke and prolonged rehab course; still "weak on left side but not much" (03/29/2017)  . Family history of adverse reaction to anesthesia    "mom took a long while to wake up; she was allergic to it" (03/29/2017)  . History of blood transfusion 1980s   "related to minor sickle cell crisis" (03/29/2017)  . HLD (hyperlipidemia) 2009  . Hypertension   . Migraine headache    "q couple years now" (03/29/2017)  . Palpitation  07/18/2016  . Pancreatic divisum    S/P ERCP with stenting 07/2010 at [facility], then stent removal (08/05/2010)  . Pneumonia    "had it in my teens 3 times" (03/29/2017)  . Pulmonary embolus ([place]) 08/2004   Two areas of V/Q mismatch. Findings compatible  with high probability for pulmonary embolus.; pt was on coumadin for 1 year  . Seizures ([place]) 1993   "after the stroke in 1993; none for years now"  (03/29/2017)  . Sickle cell trait ([place])   . Thalassemia   . Type II diabetes mellitus ([place]) 2010   well controlled  . Uterine cancer ([place])     Family History  Problem Relation Age of Onset  . Prostate cancer Father   . Cancer Father        stomach ca died x 1 year ago  . Sickle cell anemia Brother   . Lung cancer Maternal Aunt   . Lung cancer Maternal Uncle   . Breast cancer Maternal Grandmother   . Pancreatic cancer Maternal Grandmother   . Heart disease Maternal Grandfather   . Heart disease Paternal Grandfather   . Diabetes Other   . Cancer Other        multiple cancers pancreas,colon, breast    Past Surgical History:  Procedure Laterality Date  . ABDOMINAL HYSTERECTOMY  1980   2/2 endometriosis  . APPENDECTOMY  ?1980   "I think I've had it out" (11/20/2012)  . BREAST LUMPECTOMY Bilateral 1980's   "in my milk ducts; both benign" (02/27/2012)  . CORONARY ANGIOGRAM Bilateral 03/17/2011   Procedure: CORONARY ANGIOGRAM;  Surgeon: Jettie Booze, MD;  Location: [facility] CATH LAB;  Service: Cardiovascular;  Laterality: Bilateral;  . ERCP  07/2010   w/stent placement  at [facility], then stent removal (08/05/2010)  . ESOPHAGOGASTRODUODENOSCOPY N/A 10/26/2013   Procedure: ESOPHAGOGASTRODUODENOSCOPY (EGD);  Surgeon: Jerene Bears, MD;  Location: [facility] ENDOSCOPY;  Service: Endoscopy;  Laterality: N/A;  . Pancreatic stent placement/removal     placed in 2011; removed in 2012/H&P (02/27/2012); "I've had several" (03/29/2017)  . SPHINCTEROTOMY     Archie Endo 11/20/2012   Social History    Occupational History    Employer: UNEMPLOYED  Tobacco Use  . Smoking status: Former Smoker    Packs/day: 0.50    Years: 30.00    Pack years: 15.00    Types: Cigarettes    Quit date: 01/01/1999    Years since quitting: 19.8  . Smokeless tobacco: Never Used  Substance and Sexual Activity  . Alcohol use: No    Alcohol/week: 0.0 standard drinks  . Drug use: No  . Sexual activity: Yes    Partners: Male

## 2018-11-16 ENCOUNTER — Other Ambulatory Visit: Payer: Self-pay | Admitting: Internal Medicine

## 2018-11-16 DIAGNOSIS — Q453 Other congenital malformations of pancreas and pancreatic duct: Secondary | ICD-10-CM

## 2018-11-18 NOTE — Telephone Encounter (Signed)
Refill approved.

## 2018-11-21 ENCOUNTER — Ambulatory Visit: Payer: Medicare Other | Attending: Family Medicine | Admitting: Physical Therapy

## 2018-11-21 ENCOUNTER — Other Ambulatory Visit: Payer: Self-pay

## 2018-11-21 DIAGNOSIS — M6281 Muscle weakness (generalized): Secondary | ICD-10-CM | POA: Diagnosis present

## 2018-11-21 DIAGNOSIS — M25611 Stiffness of right shoulder, not elsewhere classified: Secondary | ICD-10-CM | POA: Insufficient documentation

## 2018-11-21 DIAGNOSIS — M25511 Pain in right shoulder: Secondary | ICD-10-CM | POA: Diagnosis present

## 2018-11-21 NOTE — Addendum Note (Signed)
Addended by: Raeford Razor L on: 11/21/2018 01:27 PM   Modules accepted: Orders

## 2018-11-21 NOTE — Therapy (Signed)
[place], [place], [address] Phone: [phone]   Fax:  [phone]  Physical Therapy Evaluation  Patient Details  Name: [name] MRN: [id number] Date of Birth: 1955/06/26 Referring Provider (PT): Dr. Eunice Blase    Encounter Date: 11/21/2018  PT End of Session - 11/21/18 0818    Visit Number  1    Number of Visits  16    Date for PT Re-Evaluation  01/16/19    PT Start Time  0758    PT Stop Time  0853    PT Time Calculation (min)  55 min    Activity Tolerance  Patient limited by pain    Behavior During Therapy  [facility] for tasks assessed/performed       Past Medical History:  Diagnosis Date  . Beta thalassemia ([place])   . Blood dyscrasia    thalasemia, sickle cell trait  . Childhood asthma   . Chronic abdominal pain    Unclear etiology, thought to be due to chronic pancreatitis previously in setting of her pancreatic divisum - however, EUS and EGD performed at [facility]  (10/05/2011) showing normal esophageall, gastric, duodenal mucosa. EUS showing no pancreatic masses, cysts, or changes of chronic pancreatitis. Biliary system nondilated and had no endosonographic abnormalities.  . CVA (cerebral vascular accident) ([place]) 1993   Per report by patient she had a stroke and prolonged rehab course; still "weak on left side but not much" (03/29/2017)  . Family history of adverse reaction to anesthesia    "mom took a long while to wake up; she was allergic to it" (03/29/2017)  . History of blood transfusion 1980s   "related to minor sickle cell crisis" (03/29/2017)  . HLD (hyperlipidemia) 2009  . Hypertension   . Migraine headache    "q couple years now" (03/29/2017)  . Palpitation 07/18/2016  . Pancreatic divisum    S/P ERCP with stenting 07/2010 at [facility], then stent removal (08/05/2010)  . Pneumonia    "had it in my teens 3 times" (03/29/2017)  . Pulmonary embolus ([place]) 08/2004   Two  areas of V/Q mismatch. Findings compatible  with high probability for pulmonary embolus.; pt was on coumadin for 1 year  . Seizures ([place]) 1993   "after the stroke in 1993; none for years now"  (03/29/2017)  . Sickle cell trait ([place])   . Thalassemia   . Type II diabetes mellitus ([place]) 2010   well controlled  . Uterine cancer [facility])     Past Surgical History:  Procedure Laterality Date  . ABDOMINAL HYSTERECTOMY  1980   2/2 endometriosis  . APPENDECTOMY  ?1980   "I think I've had it out" (11/20/2012)  . BREAST LUMPECTOMY Bilateral 1980's   "in my milk ducts; both benign" (02/27/2012)  . CORONARY ANGIOGRAM Bilateral 03/17/2011   Procedure: CORONARY ANGIOGRAM;  Surgeon: Jettie Booze, MD;  Location: [facility] CATH LAB;  Service: Cardiovascular;  Laterality: Bilateral;  . ERCP  07/2010   w/stent placement  at [facility], then stent removal (08/05/2010)  . ESOPHAGOGASTRODUODENOSCOPY N/A 10/26/2013   Procedure: ESOPHAGOGASTRODUODENOSCOPY (EGD);  Surgeon: Jerene Bears, MD;  Location: [facility] ENDOSCOPY;  Service: Endoscopy;  Laterality: N/A;  . Pancreatic stent placement/removal     placed in 2011; removed in 2012/H&P (02/27/2012); "I've had several" (03/29/2017)  . SPHINCTEROTOMY     Archie Endo 11/20/2012    There were no vitals filed for this visit.   Subjective Assessment - 11/21/18 0802    Subjective  Patient presents with new onset of Rt shoulder pain and limitations in mobility, ability to use her dominant arm due to pain and weakness.  She has difficulty getting her housework done, ADLs and lifting arm above waist height.  She    Pertinent History  diabetes, CVA, sickle cell trait, pancreatits    Limitations  Lifting;Writing;House hold activities    Patient Stated Goals  Patient would like to be able to use her arm , less pain, sleep better    Currently in Pain?  Yes    Pain Score  6     Pain Location  Shoulder    Pain Orientation  Right;Lateral    Pain Descriptors / Indicators  Aching    Pain  Type  Chronic pain    Pain Radiating Towards  can go down to lower arm and hands    Pain Onset  More than a month ago    Pain Frequency  Intermittent    Aggravating Factors   using Rt Arm, sleep    Pain Relieving Factors  heating pad, rest    Effect of Pain on Daily Activities  Rt arm dominant         [facility] PT Assessment - 11/21/18 0001      Assessment   Medical Diagnosis  R shoulder pain     Referring Provider (PT)  Dr. Legrand Como Hilts     Onset Date/Surgical Date  --   about 1 month    Hand Dominance  Right    Prior Therapy  No       Precautions   Precautions  None      Restrictions   Weight Bearing Restrictions  No      Balance Screen   Has the patient fallen in the past 6 months  No      [place] residence    Living Arrangements  Spouse/significant other;Children    Type of Home  Apartment      Prior Function   Level of Independence  Independent with basic ADLs;Needs assistance with homemaking    Vocation  Retired    Public affairs consultant    Leisure  likes to travel, dining out, shopping, kids       Cognition   Overall Cognitive Status  Within Functional Limits for tasks assessed      Sensation   Light Touch  Appears Intact      Posture/Postural Control   Posture/Postural Control  Postural limitations      AROM   Right Shoulder Flexion  74 Degrees    Right Shoulder ABduction  72 Degrees    Right Shoulder Internal Rotation  65 Degrees   FR to Rt buttock , 70 deg Prom    Right Shoulder External Rotation  45 Degrees   at 45 deg.  PROM to 55 deg PAIN   Left Shoulder Flexion  162 Degrees    Left Shoulder Internal Rotation  --   FR to mid thoracic    Left Shoulder External Rotation  --   FR to T3     PROM   Overall PROM Comments  pain all planes: flexion 95 deg, abduction 90 deg, ER 65 and IR 75.  End feel is empty due to pain not tissue restriction       Strength   Overall Strength  Comments  Rt 18 lbs , Lt 40 lbs     Right Shoulder Flexion  3-/5  Right Shoulder ABduction  3-/5    Left Shoulder Flexion  4+/5    Left Shoulder ABduction  4+/5    Right Elbow Flexion  3+/5    Left Elbow Flexion  4+/5         Objective measurements completed on examination: See above findings.       PT Education - 11/21/18 0850    Education Details  PT/POC, HEP, AAROM with cane, frozen shoulder, anatomy    Person(s) Educated  Patient    Methods  Explanation;Demonstration;Handout    Comprehension  Verbalized understanding;Returned demonstration;Need further instruction         Plan - 11/21/18 0850    Clinical Impression Statement  Patient presents for mod complexity eval of Rt shoulder pain.  Symptoms and onset consistent with frozen shoulder but pain dominant.  Limited to about 90 deg in flexion and abduction, unable to tolerate PROM beyond 90 deg.    Personal Factors and Comorbidities  Comorbidity 1;Comorbidity 2    Comorbidities  diabetes, pancreatitis    Examination-Activity Limitations  Lift;Bed Mobility;Transfers;Reach Overhead;Hygiene/Grooming;Dressing    Examination-Participation Restrictions  Laundry;Community Activity;Yard Work;Cleaning;Meal Prep    Stability/Clinical Decision Making  Evolving/Moderate complexity    Clinical Decision Making  Moderate    Rehab Potential  Excellent    PT Frequency  2x / week    PT Duration  8 weeks    PT Treatment/Interventions  ADLs/Self Care Home Management;Iontophoresis 4mg /ml Dexamethasone;Passive range of motion;Ultrasound;Cryotherapy;Electrical Stimulation;Therapeutic exercise;Therapeutic activities;Patient/family education;Manual techniques;Taping;Moist Heat;Functional mobility training    PT Next Visit Plan  ROM, strength, modalities for pain    PT Home Exercise Plan  supine cane    Consulted and Agree with Plan of Care  Patient       Patient will benefit from skilled therapeutic intervention in order to improve the  following deficits and impairments:  Pain, Decreased activity tolerance, Decreased range of motion, Decreased strength, Impaired UE functional use, Impaired flexibility, Decreased mobility  Visit Diagnosis: 1. Acute pain of right shoulder   2. Muscle weakness (generalized)   3. Stiffness of right shoulder, not elsewhere classified        Problem List Patient Active Problem List   Diagnosis Date Noted  . Bursitis of right shoulder 09/19/2018  . Right calf pain 05/18/2018  . Arthralgia 01/19/2018  . Grief reaction 02/24/2017  . Encounter for screening mammogram for breast cancer 12/02/2016  . Chronic pancreatitis ([place]) 08/31/2016  . Colon cancer screening 05/23/2016  . Opioid dependence ([place]) 04/28/2016  . Sickle cell trait ([place]) 11/16/2011  . Seizure disorder (history after stroke last sz late 1990s) 11/16/2011  . Beta thalassemia ([place]) 11/16/2011  . History of asthma 11/16/2011  . Iron deficiency anemia 11/16/2011  . Chronic abdominal pain   . Preventative health care 07/20/2010  . GERD 08/03/2007  . Congenital anomaly of pancreas 02/28/2006  . Hyperlipidemia 02/06/2006  . Essential hypertension 02/06/2006  . Diabetes type 2, controlled ([place]) 02/07/1992    , 11/21/2018, 9:00 AM  [place], [place], [address] Phone: [phone]   Fax:  [phone]  Name: Stacyann Mcconaughy MRN: [id number] Date of Birth: 06/24/1955   Raeford Razor, PT 11/21/18 1:25 PM Phone: [phone] Fax: [phone]

## 2018-11-21 NOTE — Patient Instructions (Signed)
SHOULDER: External Rotation - Supine (Cane)   Hold cane with both hands. Rotate arm away from body. Keep elbow on floor and next to body. _10__ reps per set, __2_ sets per day, __7_ days per week Add towel to keep elbow at side.  Copyright  VHI. All rights reserved.  Cane Horizontal - Supine   With straight arms holding cane above shoulders, bring cane out to right, center, out to left, and back to above head. Repeat _10__ times. Do 2___ times per day.  Copyright  VHI. All rights reserved.  Cane Exercise: Flexion   Lie on back, holding cane above chest. Keeping arms as straight as possible, lower cane toward floor beyond head. Hold __10-15 __ seconds. Repeat __10__ times. Do __2__ sessions per day.  http://gt2.exer.us/91   Copyright  VHI. All rights reserved.

## 2018-11-27 ENCOUNTER — Other Ambulatory Visit: Payer: Self-pay

## 2018-11-27 ENCOUNTER — Ambulatory Visit: Payer: Medicare Other | Admitting: Physical Therapy

## 2018-11-27 ENCOUNTER — Encounter: Payer: Self-pay | Admitting: Physical Therapy

## 2018-11-27 DIAGNOSIS — M25611 Stiffness of right shoulder, not elsewhere classified: Secondary | ICD-10-CM

## 2018-11-27 DIAGNOSIS — M6281 Muscle weakness (generalized): Secondary | ICD-10-CM

## 2018-11-27 DIAGNOSIS — M25511 Pain in right shoulder: Secondary | ICD-10-CM

## 2018-11-27 NOTE — Therapy (Signed)
[place], [place], [address] Phone: [phone]   Fax:  [phone]  Physical Therapy Treatment  Patient Details  Name: [name] MRN: [id number] Date of Birth: 10-23-1955 Referring Provider (PT): Dr. Eunice Blase    Encounter Date: 11/27/2018  PT End of Session - 11/27/18 0839    Visit Number  2    Number of Visits  16    Date for PT Re-Evaluation  01/16/19    PT Start Time  0830    PT Stop Time  0920    PT Time Calculation (min)  50 min    Activity Tolerance  Patient tolerated treatment well    Behavior During Therapy  [facility] for tasks assessed/performed       Past Medical History:  Diagnosis Date  . Beta thalassemia ([place])   . Blood dyscrasia    thalasemia, sickle cell trait  . Childhood asthma   . Chronic abdominal pain    Unclear etiology, thought to be due to chronic pancreatitis previously in setting of her pancreatic divisum - however, EUS and EGD performed at [facility]  (10/05/2011) showing normal esophageall, gastric, duodenal mucosa. EUS showing no pancreatic masses, cysts, or changes of chronic pancreatitis. Biliary system nondilated and had no endosonographic abnormalities.  . CVA (cerebral vascular accident) ([place]) 1993   Per report by patient she had a stroke and prolonged rehab course; still "weak on left side but not much" (03/29/2017)  . Family history of adverse reaction to anesthesia    "mom took a long while to wake up; she was allergic to it" (03/29/2017)  . History of blood transfusion 1980s   "related to minor sickle cell crisis" (03/29/2017)  . HLD (hyperlipidemia) 2009  . Hypertension   . Migraine headache    "q couple years now" (03/29/2017)  . Palpitation 07/18/2016  . Pancreatic divisum    S/P ERCP with stenting 07/2010 at [facility], then stent removal (08/05/2010)  . Pneumonia    "had it in my teens 3 times" (03/29/2017)  . Pulmonary embolus ([place]) 08/2004   Two areas of V/Q mismatch. Findings compatible  with high probability for pulmonary embolus.; pt was on coumadin for 1 year  . Seizures ([place]) 1993   "after the stroke in 1993; none for years now"  (03/29/2017)  . Sickle cell trait ([place])   . Thalassemia   . Type II diabetes mellitus ([place]) 2010   well controlled  . Uterine cancer [facility])     Past Surgical History:  Procedure Laterality Date  . ABDOMINAL HYSTERECTOMY  1980   2/2 endometriosis  . APPENDECTOMY  ?1980   "I think I've had it out" (11/20/2012)  . BREAST LUMPECTOMY Bilateral 1980's   "in my milk ducts; both benign" (02/27/2012)  . CORONARY ANGIOGRAM Bilateral 03/17/2011   Procedure: CORONARY ANGIOGRAM;  Surgeon: Jettie Booze, MD;  Location[facility] CATH LAB;  Service: Cardiovascular;  Laterality: Bilateral;  . ERCP  07/2010   w/stent placement  at [facility], then stent removal (08/05/2010)  . ESOPHAGOGASTRODUODENOSCOPY N/A 10/26/2013   Procedure: ESOPHAGOGASTRODUODENOSCOPY (EGD);  Surgeon: Jerene Bears, MD;  Location: [facility] ENDOSCOPY;  Service: Endoscopy;  Laterality: N/A;  . Pancreatic stent placement/removal     placed in 2011; removed in 2012/H&P (02/27/2012); "I've had several" (03/29/2017)  . SPHINCTEROTOMY     Archie Endo 11/20/2012    There were no vitals filed for this visit.  Subjective Assessment - 11/27/18 0836    Subjective  Pt presenting  today with R shoulder pain. pt reporting 8/10 pain in R shoulder. Pt also reporting taking a tylenol prior to therapy. Pt stated she had a bad weekend due to pain. Pt reporting radiating pain down R arm and hand. Pt feels like her grip has been effected.    Pertinent History  diabetes, CVA, sickle cell trait, pancreatits    Limitations  Lifting;Writing;House hold activities    Patient Stated Goals  Patient would like to be able to use her arm , less pain, sleep better    Currently in Pain?  Yes    Pain Score  8     Pain Location  Shoulder    Pain Orientation  Right;Lateral    Pain  Descriptors / Indicators  Aching;Throbbing    Pain Type  Chronic pain    Pain Radiating Towards  down arm and into hands    Pain Onset  More than a month ago         [facility] PT Assessment - 11/27/18 0001      Assessment   Medical Diagnosis  R shoulder pain     Referring Provider (PT)  Dr. Legrand Como Hilts     Hand Dominance  Right    Prior Therapy  No       Precautions   Precautions  None      Restrictions   Weight Bearing Restrictions  No      Balance Screen   Has the patient fallen in the past 6 months  No      AROM   Right Shoulder Flexion  118 Degrees   active assisted   Right Shoulder External Rotation  50 Degrees    Left Shoulder Flexion  164 Degrees                   [facility] Adult PT Treatment/Exercise - 11/27/18 0001      Exercises   Exercises  Shoulder      Shoulder Exercises: Supine   Flexion  AAROM;Both;15 reps    ABduction  AAROM;10 reps    Other Supine Exercises  cervical retraction x 10 holding 5 seconds, upper trap stretch x 5 holding 10 seconds each side      Shoulder Exercises: Seated   Other Seated Exercises  shoulder depression and scapular retraction x 5 each      Shoulder Exercises: Isometric Strengthening   Flexion  Supine;3X5"    Extension  3X5"    External Rotation  3X5"    Internal Rotation  3X5"      Modalities   Modalities  Moist Heat      Moist Heat Therapy   Number Minutes Moist Heat  10 Minutes    Moist Heat Location  Shoulder      Manual Therapy   Manual Therapy  Joint mobilization;Soft tissue mobilization    Joint Mobilization  Grade 1-2 mobs to R shoulder    Soft tissue mobilization  PROM all planes             PT Education - 11/27/18 0838    Education Details  Importance of ROM at home and best way for postioning in the bed    Person(s) Educated  Patient    Methods  Explanation    Comprehension  Verbalized understanding       PT Short Term Goals - 11/27/18 0925      PT SHORT TERM GOAL #1   Title   Pt will be I with initial HEP  Status  On-going      PT SHORT TERM GOAL #2   Title  Pt will be able to raise arm to shoulder height (flexion and abduction) , 100 deg with pain no more than 4/10 most of the time for ADLs, housework    Baseline  able to flex to 118 degree on 11/27/2018 but pain 7-8/10.    Time  4    Period  Weeks    Status  On-going      PT SHORT TERM GOAL #3   Title  Pt will be able to use Rt arm to chop, cook and at waist height without increased pain    Status  New        PT Long Term Goals - 11/21/18 0932      PT LONG TERM GOAL #1   Title  Pt will demonstrate full AROM of Rt UE within 20 deg of Lt UE for full mobility of upper body    Baseline  Lt UE flexion 160 , ER 90 and IR 90 deg    Time  8    Period  Weeks    Status  New    Target Date  01/16/19      PT LONG TERM GOAL #2   Title  Pt will be able to sleep without interruption from pain most nights of the week.    Time  8    Period  Weeks    Status  New    Target Date  01/16/19      PT LONG TERM GOAL #3   Title  Pt will be able to ;get dressed, washed and do light home tasks without limitation from pain    Time  8    Period  Weeks    Status  New    Target Date  01/16/19      PT LONG TERM GOAL #4   Title  Pt will demo Rt UE strength to 4/5 or more for maximal shoulder function.    Time  8    Period  Weeks    Target Date  01/16/19      PT LONG TERM GOAL #5   Title  Pt will be I with HEP upon discharge    Time  8    Period  Weeks    Status  New    Target Date  01/16/19      Additional Long Term Goals   Additional Long Term Goals  Yes      PT LONG TERM GOAL #6   Title  Pt will score FOTO limited no more than 35%    Time  8    Period  Weeks    Status  New    Target Date  01/16/19            Plan - 11/27/18 0920    Clinical Impression Statement  Pt still presenting with R shoulder pain of 8/10. Pt tolerating exercises well but limited by pain with ER, ABD and flexion. Pt is  making progress with flexion to 118 degrees AAROM. Pt with 5 degree increase in ER this visit. Grade 1-2 joint mobs and STW to R shoulder and pectoralis. Pt reporting 5/10 pain at end of session. Continue skilled PT.    Personal Factors and Comorbidities  Comorbidity 1;Comorbidity 2    Examination-Activity Limitations  Lift;Bed Mobility;Transfers;Reach Overhead;Hygiene/Grooming;Dressing    Rehab Potential  Excellent    PT Frequency  2x / week  PT Duration  8 weeks    PT Treatment/Interventions  ADLs/Self Care Home Management;Iontophoresis 4mg /ml Dexamethasone;Passive range of motion;Ultrasound;Cryotherapy;Electrical Stimulation;Therapeutic exercise;Therapeutic activities;Patient/family education;Manual techniques;Taping;Moist Heat;Functional mobility training    PT Next Visit Plan  ROM, strength, modalities for pain    PT Home Exercise Plan  supine cane    Consulted and Agree with Plan of Care  Patient       Patient will benefit from skilled therapeutic intervention in order to improve the following deficits and impairments:     Visit Diagnosis: 1. Muscle weakness (generalized)   2. Acute pain of right shoulder   3. Stiffness of right shoulder, not elsewhere classified        Problem List Patient Active Problem List   Diagnosis Date Noted  . Bursitis of right shoulder 09/19/2018  . Right calf pain 05/18/2018  . Arthralgia 01/19/2018  . Grief reaction 02/24/2017  . Encounter for screening mammogram for breast cancer 12/02/2016  . Chronic pancreatitis ([place]) 08/31/2016  . Colon cancer screening 05/23/2016  . Opioid dependence ([place]) 04/28/2016  . Sickle cell trait ([place]) 11/16/2011  . Seizure disorder (history after stroke last sz late 1990s) 11/16/2011  . Beta thalassemia ([place]) 11/16/2011  . History of asthma 11/16/2011  . Iron deficiency anemia 11/16/2011  . Chronic abdominal pain   . Preventative health care 07/20/2010  . GERD 08/03/2007  . Congenital anomaly of pancreas  02/28/2006  . Hyperlipidemia 02/06/2006  . Essential hypertension 02/06/2006  . Diabetes type 2, controlled ([place]) 02/07/1992    Oretha Caprice, PT 11/27/2018, 9:28 AM  [facility] [address] [place], [place], [address] Phone: [phone]   Fax:  [phone]  Name: Paticia Moster MRN: [id number] Date of Birth: December 27, 1955

## 2018-11-29 ENCOUNTER — Encounter: Payer: Medicare Other | Admitting: Physical Therapy

## 2018-11-29 ENCOUNTER — Other Ambulatory Visit: Payer: Self-pay

## 2018-11-29 ENCOUNTER — Ambulatory Visit: Payer: Medicare Other | Admitting: Physical Therapy

## 2018-11-29 ENCOUNTER — Encounter: Payer: Self-pay | Admitting: Physical Therapy

## 2018-11-29 DIAGNOSIS — M25611 Stiffness of right shoulder, not elsewhere classified: Secondary | ICD-10-CM

## 2018-11-29 DIAGNOSIS — M25511 Pain in right shoulder: Secondary | ICD-10-CM

## 2018-11-29 DIAGNOSIS — M6281 Muscle weakness (generalized): Secondary | ICD-10-CM

## 2018-11-29 NOTE — Therapy (Signed)
[place], [place], [address] Phone: [phone]   Fax:  [phone]  Physical Therapy Treatment  Patient Details  Name: [name] MRN: [id number] Date of Birth: 07/14/1955 Referring Provider (PT): Dr. Eunice Blase    Encounter Date: 11/29/2018  PT End of Session - 11/29/18 0759    Visit Number  3    Number of Visits  16    Date for PT Re-Evaluation  01/16/19    PT Start Time  0745    PT Stop Time  0830    PT Time Calculation (min)  45 min    Activity Tolerance  Patient tolerated treatment well    Behavior During Therapy  [facility] for tasks assessed/performed       Past Medical History:  Diagnosis Date  . Beta thalassemia ([place])   . Blood dyscrasia    thalasemia, sickle cell trait  . Childhood asthma   . Chronic abdominal pain    Unclear etiology, thought to be due to chronic pancreatitis previously in setting of her pancreatic divisum - however, EUS and EGD performed at [place]  (10/05/2011) showing normal esophageall, gastric, duodenal mucosa. EUS showing no pancreatic masses, cysts, or changes of chronic pancreatitis. Biliary system nondilated and had no endosonographic abnormalities.  . CVA (cerebral vascular accident) ([place]) 1993   Per report by patient she had a stroke and prolonged rehab course; still "weak on left side but not much" (03/29/2017)  . Family history of adverse reaction to anesthesia    "mom took a long while to wake up; she was allergic to it" (03/29/2017)  . History of blood transfusion 1980s   "related to minor sickle cell crisis" (03/29/2017)  . HLD (hyperlipidemia) 2009  . Hypertension   . Migraine headache    "q couple years now" (03/29/2017)  . Palpitation 07/18/2016  . Pancreatic divisum    S/P ERCP with stenting 07/2010 at [facility], then stent removal (08/05/2010)  . Pneumonia    "had it in my teens 3 times" (03/29/2017)  . Pulmonary embolus ([place]) 08/2004   Two areas of V/Q mismatch. Findings compatible  with high probability for pulmonary embolus.; pt was on coumadin for 1 year  . Seizures ([place]) 1993   "after the stroke in 1993; none for years now"  (03/29/2017)  . Sickle cell trait ([place])   . Thalassemia   . Type II diabetes mellitus ([place]) 2010   well controlled  . Uterine cancer [facility])     Past Surgical History:  Procedure Laterality Date  . ABDOMINAL HYSTERECTOMY  1980   2/2 endometriosis  . APPENDECTOMY  ?1980   "I think I've had it out" (11/20/2012)  . BREAST LUMPECTOMY Bilateral 1980's   "in my milk ducts; both benign" (02/27/2012)  . CORONARY ANGIOGRAM Bilateral 03/17/2011   Procedure: CORONARY ANGIOGRAM;  Surgeon: Jettie Booze, MD;  Location: [facility] CATH LAB;  Service: Cardiovascular;  Laterality: Bilateral;  . ERCP  07/2010   w/stent placement  at [facility], then stent removal (08/05/2010)  . ESOPHAGOGASTRODUODENOSCOPY N/A 10/26/2013   Procedure: ESOPHAGOGASTRODUODENOSCOPY (EGD);  Surgeon: Jerene Bears, MD;  Location: [facility] ENDOSCOPY;  Service: Endoscopy;  Laterality: N/A;  . Pancreatic stent placement/removal     placed in 2011; removed in 2012/H&P (02/27/2012); "I've had several" (03/29/2017)  . SPHINCTEROTOMY     Archie Endo 11/20/2012    There were no vitals filed for this visit.  Subjective Assessment - 11/29/18 0758    Subjective  Relays compliance  with HEP, pain is a 7/10 in her Rt shoulder today    Pertinent History  diabetes, CVA, sickle cell trait, pancreatits    Limitations  Lifting;Writing;House hold activities    Patient Stated Goals  Patient would like to be able to use her arm , less pain, sleep better    Pain Onset  More than a month ago                       [facility] Adult PT Treatment/Exercise - 11/29/18 0001      Shoulder Exercises: Pulleys   Flexion  2 minutes    ABduction  2 minutes      Shoulder Exercises: ROM/Strengthening   [place]  10 reps flex, scaption, circles      Shoulder  Exercises: Stretch   Internal Rotation Stretch Limitations  10 reps 10 sec with strap reaching across back (unable to reach up yet)    Table Stretch - External Rotation Limitations  10 reps 10 sec      Modalities   Modalities  Moist Heat      Moist Heat Therapy   Number Minutes Moist Heat  7 Minutes   pre tx   Moist Heat Location  Shoulder      Manual Therapy   Joint Mobilization  Grade 1-3 mobs to R shoulder    Soft tissue mobilization  PROM all planes               PT Short Term Goals - 11/27/18 0925      PT SHORT TERM GOAL #1   Title  Pt will be I with initial HEP    Status  On-going      PT SHORT TERM GOAL #2   Title  Pt will be able to raise arm to shoulder height (flexion and abduction) , 100 deg with pain no more than 4/10 most of the time for ADLs, housework    Baseline  able to flex to 118 degree on 11/27/2018 but pain 7-8/10.    Time  4    Period  Weeks    Status  On-going      PT SHORT TERM GOAL #3   Title  Pt will be able to use Rt arm to chop, cook and at waist height without increased pain    Status  New        PT Long Term Goals - 11/21/18 0932      PT LONG TERM GOAL #1   Title  Pt will demonstrate full AROM of Rt UE within 20 deg of Lt UE for full mobility of upper body    Baseline  Lt UE flexion 160 , ER 90 and IR 90 deg    Time  8    Period  Weeks    Status  New    Target Date  01/16/19      PT LONG TERM GOAL #2   Title  Pt will be able to sleep without interruption from pain most nights of the week.    Time  8    Period  Weeks    Status  New    Target Date  01/16/19      PT LONG TERM GOAL #3   Title  Pt will be able to ;get dressed, washed and do light home tasks without limitation from pain    Time  8    Period  Weeks    Status  New  Target Date  01/16/19      PT LONG TERM GOAL #4   Title  Pt will demo Rt UE strength to 4/5 or more for maximal shoulder function.    Time  8    Period  Weeks    Target Date  01/16/19       PT LONG TERM GOAL #5   Title  Pt will be I with HEP upon discharge    Time  8    Period  Weeks    Status  New    Target Date  01/16/19      Additional Long Term Goals   Additional Long Term Goals  Yes      PT LONG TERM GOAL #6   Title  Pt will score FOTO limited no more than 35%    Time  8    Period  Weeks    Status  New    Target Date  01/16/19            Plan - 11/29/18 0826    Clinical Impression Statement  Session focused on ROM with AROM, AAROM, and PROM along with [place] mobs. She does appear to be improving with ROM and GH mobility but still having high pain levels. She was instructed to stay in tolerable amount of Pain during exercises. PT will continue to progress as tolerated.    Personal Factors and Comorbidities  Comorbidity 1;Comorbidity 2    Examination-Activity Limitations  Lift;Bed Mobility;Transfers;Reach Overhead;Hygiene/Grooming;Dressing    Rehab Potential  Excellent    PT Frequency  2x / week    PT Duration  8 weeks    PT Treatment/Interventions  ADLs/Self Care Home Management;Iontophoresis 4mg /ml Dexamethasone;Passive range of motion;Ultrasound;Cryotherapy;Electrical Stimulation;Therapeutic exercise;Therapeutic activities;Patient/family education;Manual techniques;Taping;Moist Heat;Functional mobility training    PT Next Visit Plan  ROM, strength, modalities for pain    PT Home Exercise Plan  supine cane    Consulted and Agree with Plan of Care  Patient       Patient will benefit from skilled therapeutic intervention in order to improve the following deficits and impairments:     Visit Diagnosis: 1. Acute pain of right shoulder   2. Muscle weakness (generalized)   3. Stiffness of right shoulder, not elsewhere classified        Problem List Patient Active Problem List   Diagnosis Date Noted  . Bursitis of right shoulder 09/19/2018  . Right calf pain 05/18/2018  . Arthralgia 01/19/2018  . Grief reaction 02/24/2017  . Encounter for screening  mammogram for breast cancer 12/02/2016  . Chronic pancreatitis ([place]) 08/31/2016  . Colon cancer screening 05/23/2016  . Opioid dependence ([place]) 04/28/2016  . Sickle cell trait ([place]) 11/16/2011  . Seizure disorder (history after stroke last sz late 1990s) 11/16/2011  . Beta thalassemia ([place]) 11/16/2011  . History of asthma 11/16/2011  . Iron deficiency anemia 11/16/2011  . Chronic abdominal pain   . Preventative health care 07/20/2010  . GERD 08/03/2007  . Congenital anomaly of pancreas 02/28/2006  . Hyperlipidemia 02/06/2006  . Essential hypertension 02/06/2006  . Diabetes type 2, controlled ([place]) 02/07/1992    Debbe Odea, PT,DPT 11/29/2018, 8:31 AM  [facility] [address] [place], [place], [address] Phone: [phone]   Fax:  [phone]  Name: Cyntha Brickman MRN: [id number] Date of Birth: Mar 30, 1956

## 2018-12-03 ENCOUNTER — Other Ambulatory Visit: Payer: Self-pay | Admitting: Internal Medicine

## 2018-12-03 DIAGNOSIS — Q453 Other congenital malformations of pancreas and pancreatic duct: Secondary | ICD-10-CM

## 2018-12-03 NOTE — Telephone Encounter (Signed)
Refill approved.

## 2018-12-04 ENCOUNTER — Ambulatory Visit: Payer: Medicare Other | Admitting: Physical Therapy

## 2018-12-07 ENCOUNTER — Other Ambulatory Visit: Payer: Self-pay

## 2018-12-07 ENCOUNTER — Encounter: Payer: Self-pay | Admitting: Physical Therapy

## 2018-12-07 ENCOUNTER — Ambulatory Visit: Payer: Medicare Other | Attending: Family Medicine | Admitting: Physical Therapy

## 2018-12-07 DIAGNOSIS — M25511 Pain in right shoulder: Secondary | ICD-10-CM | POA: Diagnosis present

## 2018-12-07 DIAGNOSIS — M25611 Stiffness of right shoulder, not elsewhere classified: Secondary | ICD-10-CM

## 2018-12-07 DIAGNOSIS — M6281 Muscle weakness (generalized): Secondary | ICD-10-CM | POA: Insufficient documentation

## 2018-12-07 NOTE — Therapy (Signed)
[place], [place], [address] Phone: [phone]   Fax:  [phone]  Physical Therapy Treatment  Patient Details  Name: [name] MRN: [id number] Date of Birth: June 19, 1955 Referring Provider (PT): Dr. Eunice Blase    Encounter Date: 12/07/2018  PT End of Session - 12/07/18 0750    Visit Number  4    Number of Visits  16    Date for PT Re-Evaluation  01/16/19    PT Start Time  0745    PT Stop Time  0840    PT Time Calculation (min)  55 min    Activity Tolerance  Patient limited by pain    Behavior During Therapy  [facility] for tasks assessed/performed       Past Medical History:  Diagnosis Date  . Beta thalassemia ([place])   . Blood dyscrasia    thalasemia, sickle cell trait  . Childhood asthma   . Chronic abdominal pain    Unclear etiology, thought to be due to chronic pancreatitis previously in setting of her pancreatic divisum - however, EUS and EGD performed at [place]  (10/05/2011) showing normal esophageall, gastric, duodenal mucosa. EUS showing no pancreatic masses, cysts, or changes of chronic pancreatitis. Biliary system nondilated and had no endosonographic abnormalities.  . CVA (cerebral vascular accident) ([place]) 1993   Per report by patient she had a stroke and prolonged rehab course; still "weak on left side but not much" (03/29/2017)  . Family history of adverse reaction to anesthesia    "mom took a long while to wake up; she was allergic to it" (03/29/2017)  . History of blood transfusion 1980s   "related to minor sickle cell crisis" (03/29/2017)  . HLD (hyperlipidemia) 2009  . Hypertension   . Migraine headache    "q couple years now" (03/29/2017)  . Palpitation 07/18/2016  . Pancreatic divisum    S/P ERCP with stenting 07/2010 at [facility], then stent removal (08/05/2010)  . Pneumonia    "had it in my teens 3 times" (03/29/2017)  . Pulmonary embolus ([place]) 08/2004   Two areas  of V/Q mismatch. Findings compatible  with high probability for pulmonary embolus.; pt was on coumadin for 1 year  . Seizures ([place]) 1993   "after the stroke in 1993; none for years now"  (03/29/2017)  . Sickle cell trait ([place])   . Thalassemia   . Type II diabetes mellitus ([place]) 2010   well controlled  . Uterine cancer [facility])     Past Surgical History:  Procedure Laterality Date  . ABDOMINAL HYSTERECTOMY  1980   2/2 endometriosis  . APPENDECTOMY  ?1980   "I think I've had it out" (11/20/2012)  . BREAST LUMPECTOMY Bilateral 1980's   "in my milk ducts; both benign" (02/27/2012)  . CORONARY ANGIOGRAM Bilateral 03/17/2011   Procedure: CORONARY ANGIOGRAM;  Surgeon: Jettie Booze, MD;  Location: [facility] CATH LAB;  Service: Cardiovascular;  Laterality: Bilateral;  . ERCP  07/2010   w/stent placement  at [facility], then stent removal (08/05/2010)  . ESOPHAGOGASTRODUODENOSCOPY N/A 10/26/2013   Procedure: ESOPHAGOGASTRODUODENOSCOPY (EGD);  Surgeon: Jerene Bears, MD;  Location: [facility] ENDOSCOPY;  Service: Endoscopy;  Laterality: N/A;  . Pancreatic stent placement/removal     placed in 2011; removed in 2012/H&P (02/27/2012); "I've had several" (03/29/2017)  . SPHINCTEROTOMY     Archie Endo 11/20/2012    There were no vitals filed for this visit.  Subjective Assessment - 12/07/18 0748    Subjective  I  think I can move my arm better.  I'm hurting like crazy.  Stiff today for some reason.    Currently in Pain?  Yes    Pain Score  9     Pain Location  Shoulder    Pain Orientation  Right    Pain Descriptors / Indicators  Aching    Pain Type  Chronic pain    Pain Onset  More than a month ago    Pain Frequency  Constant    Aggravating Factors   using R arm, sleep    Pain Relieving Factors  heating, IFC    Multiple Pain Sites  No        [facility] Adult PT Treatment/Exercise - 12/07/18 0001      Self-Care   Self-Care  RICE;Heat/Ice Application;Other Self-Care Comments    RICE  heat to relax    Other  Self-Care Comments   HEP to reduce pain, table slides       Shoulder Exercises: Supine   Other Supine Exercises  supine cane: chest press, overhead press, External rotation , cross body abduction x 10 each to tolerance, very painful        Shoulder Exercises: Seated   Other Seated Exercises  seated table slides flexion, scaption and ER x 10       Shoulder Exercises: Pulleys   Flexion  3 minutes      Moist Heat Therapy   Number Minutes Moist Heat  12 Minutes    Moist Heat Location  Shoulder      Electrical Stimulation   Electrical Stimulation Location  Rt shoulder     Electrical Stimulation Action  IFC x 12 min     Electrical Stimulation Parameters  to tol    Electrical Stimulation Goals  Pain      Manual Therapy   Joint Mobilization  Gr I-II inferior glides for GH joint     Soft tissue mobilization  PROM all planes             PT Education - 12/07/18 0809    Education Details  pain control, use of heat, TENS unit    Person(s) Educated  Patient    Methods  Explanation;Demonstration    Comprehension  Verbalized understanding;Returned demonstration       PT Short Term Goals - 12/07/18 0810      PT SHORT TERM GOAL #1   Title  Pt will be I with initial HEP    Baseline  cues due to pain    Status  Partially Met      PT SHORT TERM GOAL #2   Title  Pt will be able to raise arm to shoulder height (flexion and abduction) , 100 deg with pain no more than 4/10 most of the time for ADLs, housework    Baseline  able to flex to 118 degree on 11/27/2018 but pain 7-8/10.    Status  Partially Met      PT SHORT TERM GOAL #3   Title  Pt will be able to use Rt arm to chop, cook and at waist height without increased pain    Baseline  some days she can with min to mod pain    Status  Partially Met        PT Long Term Goals - 11/21/18 0932      PT LONG TERM GOAL #1   Title  Pt will demonstrate full AROM of Rt UE within 20 deg of Lt UE for full  mobility of upper body     Baseline  Lt UE flexion 160 , ER 90 and IR 90 deg    Time  8    Period  Weeks    Status  New    Target Date  01/16/19      PT LONG TERM GOAL #2   Title  Pt will be able to sleep without interruption from pain most nights of the week.    Time  8    Period  Weeks    Status  New    Target Date  01/16/19      PT LONG TERM GOAL #3   Title  Pt will be able to ;get dressed, washed and do light home tasks without limitation from pain    Time  8    Period  Weeks    Status  New    Target Date  01/16/19      PT LONG TERM GOAL #4   Title  Pt will demo Rt UE strength to 4/5 or more for maximal shoulder function.    Time  8    Period  Weeks    Target Date  01/16/19      PT LONG TERM GOAL #5   Title  Pt will be I with HEP upon discharge    Time  8    Period  Weeks    Status  New    Target Date  01/16/19      Additional Long Term Goals   Additional Long Term Goals  Yes      PT LONG TERM GOAL #6   Title  Pt will score FOTO limited no more than 35%    Time  8    Period  Weeks    Status  New    Target Date  01/16/19            Plan - 12/07/18 0750    Clinical Impression Statement  Patient with severe pain today, in shoulder radiating down into lower arm and hand.  She takes Tylenol and uses heating pad, did not have an injection. Can use her Rt arm for activities below shoulder height with pain, she works on moving her arm as best she can.  Used IFC to help with pain control, PROM tolerance improved post.    PT Treatment/Interventions  ADLs/Self Care Home Management;Iontophoresis 53m/ml Dexamethasone;Passive range of motion;Ultrasound;Cryotherapy;Electrical Stimulation;Therapeutic exercise;Therapeutic activities;Patient/family education;Manual techniques;Taping;Moist Heat;Functional mobility training    PT Next Visit Plan  ROM, strength, modalities for pain    PT Home Exercise Plan  supine cane    Consulted and Agree with Plan of Care  Patient       Patient will benefit from  skilled therapeutic intervention in order to improve the following deficits and impairments:     Visit Diagnosis: 1. Acute pain of right shoulder   2. Muscle weakness (generalized)   3. Stiffness of right shoulder, not elsewhere classified        Problem List Patient Active Problem List   Diagnosis Date Noted  . Bursitis of right shoulder 09/19/2018  . Right calf pain 05/18/2018  . Arthralgia 01/19/2018  . Grief reaction 02/24/2017  . Encounter for screening mammogram for breast cancer 12/02/2016  . [place] 08/31/2016  . Colon cancer screening 05/23/2016  . [place]ioid [place] ([place] 04/28/2016  . Sickle cell trait ([place] 11/16/2011  . Seizure disorder (history after stroke last sz late 1990s) 11/16/2011  . [place] ([place] 11/16/2011  .  History of asthma 11/16/2011  . Iron deficiency anemia 11/16/2011  . Chronic abdominal pain   . Preventative health care 07/20/2010  . GERD 08/03/2007  . Congenital anomaly of pancreas 02/28/2006  . Hyperlipidemia 02/06/2006  . Essential hypertension 02/06/2006  . Diabetes type 2, controlled ([place]) 02/07/1992    Avrian Delfavero 12/07/2018, 10:42 AM  [facility] [address] [place], [place], [address] Phone: [phone]   Fax:  [phone]  Name: Recie Cirrincione MRN: [id number] Date of Birth: 18-Nov-1955  Raeford Razor, PT 12/07/18 10:42 AM Phone: [phone] Fax: [phone]

## 2018-12-07 NOTE — Patient Instructions (Signed)

## 2018-12-19 ENCOUNTER — Other Ambulatory Visit: Payer: Self-pay

## 2018-12-19 ENCOUNTER — Ambulatory Visit: Payer: Medicare Other | Admitting: Physical Therapy

## 2018-12-19 DIAGNOSIS — M25611 Stiffness of right shoulder, not elsewhere classified: Secondary | ICD-10-CM

## 2018-12-19 DIAGNOSIS — M6281 Muscle weakness (generalized): Secondary | ICD-10-CM

## 2018-12-19 DIAGNOSIS — M25511 Pain in right shoulder: Secondary | ICD-10-CM

## 2018-12-19 NOTE — Patient Instructions (Signed)
Prepared By: [place], [place]  Phone: [phone]  Step 1  Step 2  Standing Shoulder External Rotation with Resistance reps: 10  sets: 2  hold: 5  daily: 2  weekly: 7 Setup  Begin in a standing upright position holding both ends of a resistance band. Your elbows should be bent at 90 degrees with a towel roll tucked under each arm, and your thumbs pointing outward.  Movement  Slowly rotate your arms out to the side, then bring them back to the starting position and repeat. Tip  Make sure to keep your hips and shoulders facing forward throughout the exercise. Think of squeezing your shoulder blades down and back as you pull your arms outward. Step 1  Step 2  Supine Shoulder Horizontal Abduction with Resistance reps: 10  sets: 2  hold: 5  daily: 2  weekly: 7 Setup  Setup Directions Movement  Begin lying on your back with your knees bent and the ends of a resistance band in each hand. Your arms should be straight up toward the ceiling.  Tip  Pull your arms apart against the resistance band, straight out to your sides, then slowly bring them back to the starting position and repeat.

## 2018-12-19 NOTE — Therapy (Signed)
[facility] [place], [place], [address] Phone: [phone]   Fax:  [phone]  Physical Therapy Treatment  Patient Details  Name: [name] MRN: [id number] Date of Birth: Aug 20, 1955 Referring Provider (PT): Dr. Eunice Blase    Encounter Date: 12/19/2018  PT End of Session - 12/19/18 0915    Visit Number  5    Number of Visits  16    Date for PT Re-Evaluation  01/16/19    PT Start Time  0834    PT Stop Time  0924    PT Time Calculation (min)  50 min    Activity Tolerance  Patient tolerated treatment well    Behavior During Therapy  [facility] for tasks assessed/performed       Past Medical History:  Diagnosis Date  . Beta thalassemia ([place])   . Blood dyscrasia    thalasemia, sickle cell trait  . Childhood asthma   . Chronic abdominal pain    Unclear etiology, thought to be due to chronic pancreatitis previously in setting of her pancreatic divisum - however, EUS and EGD performed at [facility]  (10/05/2011) showing normal esophageall, gastric, duodenal mucosa. EUS showing no pancreatic masses, cysts, or changes of chronic pancreatitis. Biliary system nondilated and had no endosonographic abnormalities.  . CVA (cerebral vascular accident) ([place]) 1993   Per report by patient she had a stroke and prolonged rehab course; still "weak on left side but not much" (03/29/2017)  . Family history of adverse reaction to anesthesia    "mom took a long while to wake up; she was allergic to it" (03/29/2017)  . History of blood transfusion 1980s   "related to minor sickle cell crisis" (03/29/2017)  . HLD (hyperlipidemia) 2009  . Hypertension   . Migraine headache    "q couple years now" (03/29/2017)  . Palpitation 07/18/2016  . Pancreatic divisum    S/P ERCP with stenting 07/2010 at [facility], then stent removal (08/05/2010)  . Pneumonia    "had it in my teens 3 times" (03/29/2017)  . Pulmonary embolus ([place]) 08/2004    Two areas of V/Q mismatch. Findings compatible  with high probability for pulmonary embolus.; pt was on coumadin for 1 year  . Seizures ([place]) 1993   "after the stroke in 1993; none for years now"  (03/29/2017)  . Sickle cell trait ([place])   . Thalassemia   . Type II diabetes mellitus ([place]) 2010   well controlled  . Uterine cancer [facility])     Past Surgical History:  Procedure Laterality Date  . ABDOMINAL HYSTERECTOMY  1980   2/2 endometriosis  . APPENDECTOMY  ?1980   "I think I've had it out" (11/20/2012)  . BREAST LUMPECTOMY Bilateral 1980's   "in my milk ducts; both benign" (02/27/2012)  . CORONARY ANGIOGRAM Bilateral 03/17/2011   Procedure: CORONARY ANGIOGRAM;  Surgeon: Jettie Booze, MD;  Location: [facility] CATH LAB;  Service: Cardiovascular;  Laterality: Bilateral;  . ERCP  07/2010   w/stent placement  at [facility], then stent removal (08/05/2010)  . ESOPHAGOGASTRODUODENOSCOPY N/A 10/26/2013   Procedure: ESOPHAGOGASTRODUODENOSCOPY (EGD);  Surgeon: Jerene Bears, MD;  Location: [facility] ENDOSCOPY;  Service: Endoscopy;  Laterality: N/A;  . Pancreatic stent placement/removal     placed in 2011; removed in 2012/H&P (02/27/2012); "I've had several" (03/29/2017)  . SPHINCTEROTOMY     Archie Endo 11/20/2012    There were no vitals filed for this visit.  Subjective Assessment - 12/19/18 0835    Subjective  Im  better I can use it more and do more at home.  Pain 2/10.    Currently in Pain?  Yes    Pain Score  2     Pain Orientation  Right    Pain Descriptors / Indicators  Discomfort    Pain Type  Chronic pain    Pain Onset  More than a month ago    Pain Frequency  Constant    Aggravating Factors   using r arm    Pain Relieving Factors  heat, stim       [facility] Adult PT Treatment/Exercise - 12/19/18 0001      Shoulder Exercises: Supine   Horizontal ABduction  Strengthening;Both;15 reps    Theraband Level (Shoulder Horizontal ABduction)  Level 2 (Red)    External Rotation  Strengthening;Both;15  reps    Theraband Level (Shoulder External Rotation)  Level 2 (Red)    Other Supine Exercises  supine cane: chest press, overhead press, External rotation , cross body abduction x 10 each to tolerance      Shoulder Exercises: Pulleys   Flexion  3 minutes    Scaption  3 minutes      Shoulder Exercises: ROM/Strengthening   [place]  standing flexion and scaption x 20 with weight shift       Moist Heat Therapy   Number Minutes Moist Heat  15 Minutes    Moist Heat Location  Shoulder      Electrical Stimulation   Electrical Stimulation Location  Rt shoulder     Electrical Stimulation Action  IFC x 15 min     Electrical Stimulation Parameters  to tol (8)     Electrical Stimulation Goals  Pain      Manual Therapy   Joint Mobilization  Gr I-II inferior glides for GH joint     Soft tissue mobilization  PROM all planes             PT Education - 12/19/18 0914    Education Details  HEP , form with exercises    Person(s) Educated  Patient    Methods  Explanation;Handout    Comprehension  Verbalized understanding;Verbal cues required       PT Short Term Goals - 12/07/18 0810      PT SHORT TERM GOAL #1   Title  Pt will be I with initial HEP    Baseline  cues due to pain    Status  Partially Met      PT SHORT TERM GOAL #2   Title  Pt will be able to raise arm to shoulder height (flexion and abduction) , 100 deg with pain no more than 4/10 most of the time for ADLs, housework    Baseline  able to flex to 118 degree on 11/27/2018 but pain 7-8/10.    Status  Partially Met      PT SHORT TERM GOAL #3   Title  Pt will be able to use Rt arm to chop, cook and at waist height without increased pain    Baseline  some days she can with min to mod pain    Status  Partially Met        PT Long Term Goals - 12/19/18 1003      PT LONG TERM GOAL #1   Title  Pt will demonstrate full AROM of Rt UE within 20 deg of Lt UE for full mobility of upper body    Status  On-going      PT  LONG  TERM GOAL #2   Title  Pt will be able to sleep without interruption from pain most nights of the week.    Status  On-going      PT LONG TERM GOAL #3   Title  Pt will be able to ;get dressed, washed and do light home tasks without limitation from pain    Status  On-going      PT LONG TERM GOAL #4   Title  Pt will demo Rt UE strength to 4/5 or more for maximal shoulder function.    Status  On-going      PT LONG TERM GOAL #5   Title  Pt will be I with HEP upon discharge    Status  On-going      PT LONG TERM GOAL #6   Title  Pt will score FOTO limited no more than 35%    Status  On-going            Plan - 12/19/18 1004    Clinical Impression Statement  Patient with much less pain this week compared to last week.  Her AROM is improved , pain still >100 deg.  Added in strengthening to HEP today with red band.  Needs cues for technique throughout session.    PT Treatment/Interventions  ADLs/Self Care Home Management;Iontophoresis '4mg'$ /ml Dexamethasone;Passive range of motion;Ultrasound;Cryotherapy;Electrical Stimulation;Therapeutic exercise;Therapeutic activities;Patient/family education;Manual techniques;Taping;Moist Heat;Functional mobility training    PT Next Visit Plan  ROM, strength, modalities for pain    PT Home Exercise Plan  supine cane, table slides, red band ER/IR and horiz abd unattached    Consulted and Agree with Plan of Care  Patient       Patient will benefit from skilled therapeutic intervention in order to improve the following deficits and impairments:  Pain, Decreased activity tolerance, Decreased range of motion, Decreased strength, Impaired UE functional use, Impaired flexibility, Decreased mobility  Visit Diagnosis: 1. Muscle weakness (generalized)   2. Stiffness of right shoulder, not elsewhere classified   3. Acute pain of right shoulder        Problem List Patient Active Problem List   Diagnosis Date Noted  . Bursitis of right shoulder 09/19/2018   . Right calf pain 05/18/2018  . Arthralgia 01/19/2018  . Grief reaction 02/24/2017  . Encounter for screening mammogram for breast cancer 12/02/2016  . Chronic pancreatitis ([place]) 08/31/2016  . Colon cancer screening 05/23/2016  . Opioid dependence ([place]) 04/28/2016  . Sickle cell trait ([place]) 11/16/2011  . Seizure disorder (history after stroke last sz late 1990s) 11/16/2011  . Beta thalassemia ([place]) 11/16/2011  . History of asthma 11/16/2011  . Iron deficiency anemia 11/16/2011  . Chronic abdominal pain   . Preventative health care 07/20/2010  . GERD 08/03/2007  . Congenital anomaly of pancreas 02/28/2006  . Hyperlipidemia 02/06/2006  . Essential hypertension 02/06/2006  . Diabetes type 2, controlled ([place]) 02/07/1992    Shacora Zynda 12/19/2018, 1:45 PM  [facility] [address] [place], [place], [address] Phone: [phone]   Fax:  [phone]  Name: Tayelor Osborne MRN: [id number] Date of Birth: 07/04/1955  Raeford Razor, PT 12/19/18 1:45 PM Phone: [phone] Fax: [phone]

## 2018-12-21 ENCOUNTER — Ambulatory Visit: Payer: Medicare Other | Admitting: Physical Therapy

## 2018-12-21 ENCOUNTER — Encounter: Payer: Self-pay | Admitting: Physical Therapy

## 2018-12-21 ENCOUNTER — Other Ambulatory Visit: Payer: Self-pay

## 2018-12-21 DIAGNOSIS — M25511 Pain in right shoulder: Secondary | ICD-10-CM | POA: Diagnosis not present

## 2018-12-21 DIAGNOSIS — M6281 Muscle weakness (generalized): Secondary | ICD-10-CM

## 2018-12-21 DIAGNOSIS — M25611 Stiffness of right shoulder, not elsewhere classified: Secondary | ICD-10-CM

## 2018-12-21 NOTE — Therapy (Signed)
[place], [place], [address] Phone: [phone]   Fax:  [phone]  Physical Therapy Treatment  Patient Details  Name: [name] MRN: [id number] Date of Birth: [birth year] Referring Provider (PT): Dr. Eunice Blase    Encounter Date: 12/21/2018  PT End of Session - 12/21/18 0833    Visit Number  6    Number of Visits  16    Date for PT Re-Evaluation  01/16/19    PT Start Time  0830    PT Stop Time  0930    PT Time Calculation (min)  60 min    Activity Tolerance  Patient tolerated treatment well    Behavior During Therapy  [facility] for tasks assessed/performed       Past Medical History:  Diagnosis Date  . Beta thalassemia ([place])   . Blood dyscrasia    thalasemia, sickle cell trait  . Childhood asthma   . Chronic abdominal pain    Unclear etiology, thought to be due to chronic pancreatitis previously in setting of her pancreatic divisum - however, EUS and EGD performed at [facility]  (10/05/2011) showing normal esophageall, gastric, duodenal mucosa. EUS showing no pancreatic masses, cysts, or changes of chronic pancreatitis. Biliary system nondilated and had no endosonographic abnormalities.  . CVA (cerebral vascular accident) ([place]) 1993   Per report by patient she had a stroke and prolonged rehab course; still "weak on left side but not much" (03/29/2017)  . Family history of adverse reaction to anesthesia    "mom took a long while to wake up; she was allergic to it" (03/29/2017)  . History of blood transfusion 1980s   "related to minor sickle cell crisis" (03/29/2017)  . HLD (hyperlipidemia) 2009  . Hypertension   . Migraine headache    "q couple years now" (03/29/2017)  . Palpitation 07/18/2016  . Pancreatic divisum    S/P ERCP with stenting 07/2010 at [facility], then stent removal (08/05/2010)  . Pneumonia    "had it in my teens 3 times" (03/29/2017)  . Pulmonary embolus ([place]) 08/2004   Two areas of V/Q mismatch. Findings compatible  with high probability for pulmonary embolus.; pt was on coumadin for 1 year  . Seizures ([place]) 1993   "after the stroke in 1993; none for years now"  (03/29/2017)  . Sickle cell trait ([place])   . Thalassemia   . Type II diabetes mellitus ([place]) 2010   well controlled  . Uterine cancer [facility])     Past Surgical History:  Procedure Laterality Date  . ABDOMINAL HYSTERECTOMY  1980   2/2 endometriosis  . APPENDECTOMY  ?1980   "I think I've had it out" (11/20/2012)  . BREAST LUMPECTOMY Bilateral 1980's   "in my milk ducts; both benign" (02/27/2012)  . CORONARY ANGIOGRAM Bilateral 03/17/2011   Procedure: CORONARY ANGIOGRAM;  Surgeon: Jettie Booze, MD;  Location: [facility] CATH LAB;  Service: Cardiovascular;  Laterality: Bilateral;  . ERCP  07/2010   w/stent placement  at [facility], then stent removal (08/05/2010)  . ESOPHAGOGASTRODUODENOSCOPY N/A 10/26/2013   Procedure: ESOPHAGOGASTRODUODENOSCOPY (EGD);  Surgeon: Jerene Bears, MD;  Location: [facility] ENDOSCOPY;  Service: Endoscopy;  Laterality: N/A;  . Pancreatic stent placement/removal     placed in 2011; removed in 2012/H&P (02/27/2012); "I've had several" (03/29/2017)  . SPHINCTEROTOMY     Archie Endo 11/20/2012    There were no vitals filed for this visit.  Subjective Assessment - 12/21/18 0832    Subjective  I can  lift it easier, no pain right now, but if I use it , it aches.  So much better than it was.    Currently in Pain?  No/denies        [facility] Adult PT Treatment/Exercise - 12/21/18 0001      Elbow Exercises   Elbow Flexion  Strengthening;Right;Left;10 reps;Standing   3 lbs      Shoulder Exercises: Supine   Horizontal ABduction  Strengthening;Both;15 reps    Theraband Level (Shoulder Horizontal ABduction)  Level 2 (Red)    Horizontal ABduction Weight (lbs)  cues     External Rotation  Strengthening;Both;15 reps    Theraband Level (Shoulder External Rotation)  Level 2 (Red)    External  Rotation Weight (lbs)  cues     Other Supine Exercises  single arm press up 3 lbs x 15       Shoulder Exercises: Standing   Horizontal ABduction  Strengthening;Both;20 reps    Theraband Level (Shoulder Horizontal ABduction)  Level 2 (Red)    External Rotation  Strengthening;Right;20 reps    Theraband Level (Shoulder External Rotation)  Level 2 (Red)    Flexion  Strengthening;Right;10 reps    Theraband Level (Shoulder Flexion)  Level 1 (Yellow)    Extension  Strengthening;Both;20 reps;Theraband    Theraband Level (Shoulder Extension)  Level 2 (Red)    Row  Strengthening;Both;20 reps;Theraband    Theraband Level (Shoulder Row)  Level 3 (Green)    Diagonals  Strengthening;Right;10 reps    Theraband Level (Shoulder Diagonals)  Level 1 (Yellow)    Other Standing Exercises  shoulder raises 3 lbs x 10 forward and 3 lbs x 10 lateral       Shoulder Exercises: ROM/Strengthening   UBE (Upper Arm Bike)  5 min L1 each direction x 2 .5 min     Wall Wash  ball rolls 1 min each direction (red ball)       Shoulder Exercises: Stretch   Other Shoulder Stretches  sleeper stretch x 8 x 10 sec       Moist Heat Therapy   Number Minutes Moist Heat  15 Minutes    Moist Heat Location  Shoulder      Electrical Stimulation   Electrical Stimulation Location  Rt shoulder     Electrical Stimulation Action  IFC x 15     Electrical Stimulation Parameters  7     Electrical Stimulation Goals  Pain      Manual Therapy   Soft tissue mobilization  PROM all planes               PT Short Term Goals - 12/07/18 0810      PT SHORT TERM GOAL #1   Title  Pt will be I with initial HEP    Baseline  cues due to pain    Status  Partially Met      PT SHORT TERM GOAL #2   Title  Pt will be able to raise arm to shoulder height (flexion and abduction) , 100 deg with pain no more than 4/10 most of the time for ADLs, housework    Baseline  able to flex to 118 degree on 11/27/2018 but pain 7-8/10.    Status   Partially Met      PT SHORT TERM GOAL #3   Title  Pt will be able to use Rt arm to chop, cook and at waist height without increased pain    Baseline  some days she can with   min to mod pain    Status  Partially Met        PT Long Term Goals - 12/19/18 1003      PT LONG TERM GOAL #1   Title  Pt will demonstrate full AROM of Rt UE within 20 deg of Lt UE for full mobility of upper body    Status  On-going      PT LONG TERM GOAL #2   Title  Pt will be able to sleep without interruption from pain most nights of the week.    Status  On-going      PT LONG TERM GOAL #3   Title  Pt will be able to ;get dressed, washed and do light home tasks without limitation from pain    Status  On-going      PT LONG TERM GOAL #4   Title  Pt will demo Rt UE strength to 4/5 or more for maximal shoulder function.    Status  On-going      PT LONG TERM GOAL #5   Title  Pt will be I with HEP upon discharge    Status  On-going      PT LONG TERM GOAL #6   Title  Pt will score FOTO limited no more than 35%    Status  On-going            Plan - 12/21/18 0848    Clinical Impression Statement  patient continues to improve fucntionally, pain is min to none at rest and resolves quickly.  Min pain with reaching above shoulder height.    PT Treatment/Interventions  ADLs/Self Care Home Management;Iontophoresis 2m/ml Dexamethasone;Passive range of motion;Ultrasound;Cryotherapy;Electrical Stimulation;Therapeutic exercise;Therapeutic activities;Patient/family education;Manual techniques;Taping;Moist Heat;Functional mobility training    PT Next Visit Plan  ROM, strength, modalities for pain    PT Home Exercise Plan  supine cane, table slides, red band ER/IR and horiz abd unattached    Consulted and Agree with Plan of Care  Patient       Patient will benefit from skilled therapeutic intervention in order to improve the following deficits and impairments:  Pain, Decreased activity tolerance, Decreased range of  motion, Decreased strength, Impaired UE functional use, Impaired flexibility, Decreased mobility  Visit Diagnosis: Muscle weakness (generalized)  Stiffness of right shoulder, not elsewhere classified  Acute pain of right shoulder     Problem List Patient Active Problem List   Diagnosis Date Noted  . Bursitis of right shoulder 09/19/2018  . Right calf pain 05/18/2018  . Arthralgia 01/19/2018  . Grief reaction 02/24/2017  . Encounter for screening mammogram for breast cancer 12/02/2016  . Chronic pancreatitis ([place] 08/31/2016  . Colon cancer screening 05/23/2016  . Opioid dependence ([place] 04/28/2016  . Sickle cell trait ([place] 11/16/2011  . Seizure disorder (history after stroke last sz late 1990s) 11/16/2011  . Beta thalassemia ([place] 11/16/2011  . History of asthma 11/16/2011  . Iron deficiency anemia 11/16/2011  . Chronic abdominal pain   . Preventative health care 07/20/2010  . GERD 08/03/2007  . Congenital anomaly of pancreas 02/28/2006  . Hyperlipidemia 02/06/2006  . Essential hypertension 02/06/2006  . Diabetes type 2, controlled ([place] 02/07/1992    PAA,JENNIFER 12/21/2018, 9:17 AM  [place] [address]Phone: [phone]  Fax:  [phone] Name: Eileen WarmuthMRN: [id number] of Birth: [birth year] JRaeford Razor PT 12/21/18 9:17 AM Phone: [phone]: [phone]

## 2018-12-26 ENCOUNTER — Ambulatory Visit: Payer: Medicare Other | Admitting: Physical Therapy

## 2018-12-28 ENCOUNTER — Other Ambulatory Visit: Payer: Self-pay

## 2018-12-28 ENCOUNTER — Ambulatory Visit: Payer: Medicare Other | Admitting: Physical Therapy

## 2018-12-28 DIAGNOSIS — M25511 Pain in right shoulder: Secondary | ICD-10-CM | POA: Diagnosis not present

## 2018-12-28 DIAGNOSIS — M6281 Muscle weakness (generalized): Secondary | ICD-10-CM

## 2018-12-28 DIAGNOSIS — M25611 Stiffness of right shoulder, not elsewhere classified: Secondary | ICD-10-CM

## 2018-12-28 NOTE — Therapy (Signed)
[place], [place], [address] Phone: [phone]   Fax:  [phone]  Physical Therapy Treatment  Patient Details  Name: [name] MRN: [id number] Date of Birth: 1955-10-14 Referring Provider (PT): Dr. Eunice Blase    Encounter Date: 12/28/2018  PT End of Session - 12/28/18 0837    Visit Number  7    Number of Visits  16    Date for PT Re-Evaluation  01/16/19    PT Start Time  0824    PT Stop Time  0924    PT Time Calculation (min)  60 min    Activity Tolerance  Patient tolerated treatment well    Behavior During Therapy  [facility] for tasks assessed/performed       Past Medical History:  Diagnosis Date  . Beta thalassemia ([place])   . Blood dyscrasia    thalasemia, sickle cell trait  . Childhood asthma   . Chronic abdominal pain    Unclear etiology, thought to be due to chronic pancreatitis previously in setting of her pancreatic divisum - however, EUS and EGD performed at [facility]  (10/05/2011) showing normal esophageall, gastric, duodenal mucosa. EUS showing no pancreatic masses, cysts, or changes of chronic pancreatitis. Biliary system nondilated and had no endosonographic abnormalities.  . CVA (cerebral vascular accident) ([place]) 1993   Per report by patient she had a stroke and prolonged rehab course; still "weak on left side but not much" (03/29/2017)  . Family history of adverse reaction to anesthesia    "mom took a long while to wake up; she was allergic to it" (03/29/2017)  . History of blood transfusion 1980s   "related to minor sickle cell crisis" (03/29/2017)  . HLD (hyperlipidemia) 2009  . Hypertension   . Migraine headache    "q couple years now" (03/29/2017)  . Palpitation 07/18/2016  . Pancreatic divisum    S/P ERCP with stenting 07/2010 at [facility], then stent removal (08/05/2010)  . Pneumonia    "had it in my teens 3 times" (03/29/2017)  . Pulmonary embolus ([place]) 08/2004    Two areas of V/Q mismatch. Findings compatible  with high probability for pulmonary embolus.; pt was on coumadin for 1 year  . Seizures ([place]) 1993   "after the stroke in 1993; none for years now"  (03/29/2017)  . Sickle cell trait ([place])   . Thalassemia   . Type II diabetes mellitus ([place]) 2010   well controlled  . Uterine cancer [facility])     Past Surgical History:  Procedure Laterality Date  . ABDOMINAL HYSTERECTOMY  1980   2/2 endometriosis  . APPENDECTOMY  ?1980   "I think I've had it out" (11/20/2012)  . BREAST LUMPECTOMY Bilateral 1980's   "in my milk ducts; both benign" (02/27/2012)  . CORONARY ANGIOGRAM Bilateral 03/17/2011   Procedure: CORONARY ANGIOGRAM;  Surgeon: Jettie Booze, MD;  Location: [facility] CATH LAB;  Service: Cardiovascular;  Laterality: Bilateral;  . ERCP  07/2010   w/stent placement  at [facility], then stent removal (08/05/2010)  . ESOPHAGOGASTRODUODENOSCOPY N/A 10/26/2013   Procedure: ESOPHAGOGASTRODUODENOSCOPY (EGD);  Surgeon: Jerene Bears, MD;  Location: [facility] ENDOSCOPY;  Service: Endoscopy;  Laterality: N/A;  . Pancreatic stent placement/removal     placed in 2011; removed in 2012/H&P (02/27/2012); "I've had several" (03/29/2017)  . SPHINCTEROTOMY     [facility] Endo 11/20/2012    There were no vitals filed for this visit.  Subjective Assessment - 12/28/18 0824    Subjective  It  starts to ache if I use it too much.  Its ok right now.    Currently in Pain?  Yes    Pain Score  1     Pain Location  Shoulder    Pain Orientation  Right    Pain Descriptors / Indicators  Aching    Pain Type  Chronic pain    Pain Onset  More than a month ago    Pain Frequency  Intermittent    Aggravating Factors   overdoing it    Pain Relieving Factors  heat, stim, stretching          [facility] Adult PT Treatment/Exercise - 12/28/18 0001      Shoulder Exercises: Supine   Flexion  Strengthening;Right;15 reps    Shoulder Flexion Weight (lbs)  2    ABduction  Strengthening;Right;15 reps     Shoulder ABduction Weight (lbs)  2    Diagonals  Strengthening;Right;10 reps    Other Supine Exercises  supine Rt UE flexion yellow T band x 10       Shoulder Exercises: Sidelying   External Rotation  Strengthening;Right;15 reps    External Rotation Weight (lbs)  2    Flexion  AROM;Right;15 reps    Flexion Weight (lbs)  2    ABduction  AROM;Strengthening;Right;10 reps    ABduction Weight (lbs)  2      Shoulder Exercises: Pulleys   Flexion  3 minutes    Other Pulley Exercises  internal rotation stretch x 10, 10 sec       Shoulder Exercises: ROM/Strengthening   UBE (Upper Arm Bike)  6 min L1 each direction x 2 .5 min     Other ROM/Strengthening Exercises  wall ladder x 5each flexion and scaption    20th rung      Shoulder Exercises: Stretch   Corner Stretch  3 reps;30 seconds    Internal Rotation Stretch  10 seconds    Internal Rotation Stretch Limitations  x 10 , sleeper       Moist Heat Therapy   Number Minutes Moist Heat  15 Minutes    Moist Heat Location  Shoulder      Electrical Stimulation   Electrical Stimulation Location  Rt shoulder     Electrical Stimulation Action  IFC     Electrical Stimulation Goals  Pain      Manual Therapy   Manual Therapy  Soft tissue mobilization;Scapular mobilization    Soft tissue mobilization  PROM all planes    Scapular Mobilization  rotation with AROM in sidelying                PT Short Term Goals - 12/07/18 0810      PT SHORT TERM GOAL #1   Title  Pt will be I with initial HEP    Baseline  cues due to pain    Status  Partially Met      PT SHORT TERM GOAL #2   Title  Pt will be able to raise arm to shoulder height (flexion and abduction) , 100 deg with pain no more than 4/10 most of the time for ADLs, housework    Baseline  able to flex to 118 degree on 11/27/2018 but pain 7-8/10.    Status  Partially Met      PT SHORT TERM GOAL #3   Title  Pt will be able to use Rt arm to chop, cook and at waist height without  increased pain    Baseline  some days she can with min to mod pain    Status  Partially Met        PT Long Term Goals - 12/19/18 1003      PT LONG TERM GOAL #1   Title  Pt will demonstrate full AROM of Rt UE within 20 deg of Lt UE for full mobility of upper body    Status  On-going      PT LONG TERM GOAL #2   Title  Pt will be able to sleep without interruption from pain most nights of the week.    Status  On-going      PT LONG TERM GOAL #3   Title  Pt will be able to ;get dressed, washed and do light home tasks without limitation from pain    Status  On-going      PT LONG TERM GOAL #4   Title  Pt will demo Rt UE strength to 4/5 or more for maximal shoulder function.    Status  On-going      PT LONG TERM GOAL #5   Title  Pt will be I with HEP upon discharge    Status  On-going      PT LONG TERM GOAL #6   Title  Pt will score FOTO limited no more than 35%    Status  On-going            Plan - 12/28/18 0837    Clinical Impression Statement  Patient doing well, very diligent with her HEP and continues to use her arm for ADLs, home task with greater ease.  Hard to reach up and back (stargazer positon) as well as functional reach for IR.    PT Treatment/Interventions  ADLs/Self Care Home Management;Iontophoresis 32m/ml Dexamethasone;Passive range of motion;Ultrasound;Cryotherapy;Electrical Stimulation;Therapeutic exercise;Therapeutic activities;Patient/family education;Manual techniques;Taping;Moist Heat;Functional mobility training    PT Next Visit Plan  ROM, strength, modalities for pain. Try closed chain (quad, wall)    PT Home Exercise Plan  supine cane, table slides, red band ER/IR and horiz abd unattached    Consulted and Agree with Plan of Care  Patient       Patient will benefit from skilled therapeutic intervention in order to improve the following deficits and impairments:  Pain, Decreased activity tolerance, Decreased range of motion, Decreased strength,  Impaired UE functional use, Impaired flexibility, Decreased mobility  Visit Diagnosis: Muscle weakness (generalized)  Stiffness of right shoulder, not elsewhere classified  Acute pain of right shoulder     Problem List Patient Active Problem List   Diagnosis Date Noted  . Bursitis of right shoulder 09/19/2018  . Right calf pain 05/18/2018  . Arthralgia 01/19/2018  . Grief reaction 02/24/2017  . Encounter for screening mammogram for breast cancer 12/02/2016  . Chronic pancreatitis ([place] 08/31/2016  . Colon cancer screening 05/23/2016  . Opioid dependence ([place] 04/28/2016  . Sickle cell trait ([place] 11/16/2011  . Seizure disorder (history after stroke last sz late 1990s) 11/16/2011  . Beta thalassemia ([place] 11/16/2011  . History of asthma 11/16/2011  . Iron deficiency anemia 11/16/2011  . Chronic abdominal pain   . Preventative health care 07/20/2010  . GERD 08/03/2007  . Congenital anomaly of pancreas 02/28/2006  . Hyperlipidemia 02/06/2006  . Essential hypertension 02/06/2006  . Diabetes type 2, controlled ([place] 02/07/1992    PAA,JENNIFER 12/28/2018, 9:15 AM  [place] [address]Phone: [phone]  Fax:  [phone] Name: AKamarie Munoz  MRN: [id number] Date of Birth: [birth year]  Raeford Razor, PT 12/28/18 9:15 AM Phone: [phone] Fax: [phone]

## 2019-01-02 ENCOUNTER — Encounter: Payer: Self-pay | Admitting: Physical Therapy

## 2019-01-02 ENCOUNTER — Other Ambulatory Visit: Payer: Self-pay

## 2019-01-02 ENCOUNTER — Ambulatory Visit: Payer: Medicare Other | Attending: Family Medicine | Admitting: Physical Therapy

## 2019-01-02 DIAGNOSIS — M25511 Pain in right shoulder: Secondary | ICD-10-CM | POA: Diagnosis present

## 2019-01-02 DIAGNOSIS — M25611 Stiffness of right shoulder, not elsewhere classified: Secondary | ICD-10-CM | POA: Insufficient documentation

## 2019-01-02 DIAGNOSIS — M6281 Muscle weakness (generalized): Secondary | ICD-10-CM | POA: Diagnosis not present

## 2019-01-02 NOTE — Therapy (Signed)
[place], [place], [address] Phone: [phone]   Fax:  [phone]  Physical Therapy Treatment  Patient Details  Name: [name] MRN: [id number] Date of Birth: 1955-10-25 Referring Provider (PT): Dr. Eunice Blase    Encounter Date: 01/02/2019  PT End of Session - 01/02/19 0809    Visit Number  8    Number of Visits  16    Date for PT Re-Evaluation  01/16/19    PT Start Time  0802    PT Stop Time  0850    PT Time Calculation (min)  48 min       Past Medical History:  Diagnosis Date  . Beta thalassemia ([place])   . Blood dyscrasia    thalasemia, sickle cell trait  . Childhood asthma   . Chronic abdominal pain    Unclear etiology, thought to be due to chronic pancreatitis previously in setting of her pancreatic divisum - however, EUS and EGD performed at [facility]  (10/05/2011) showing normal esophageall, gastric, duodenal mucosa. EUS showing no pancreatic masses, cysts, or changes of chronic pancreatitis. Biliary system nondilated and had no endosonographic abnormalities.  . CVA (cerebral vascular accident) ([place]) 1993   Per report by patient she had a stroke and prolonged rehab course; still "weak on left side but not much" (03/29/2017)  . Family history of adverse reaction to anesthesia    "mom took a long while to wake up; she was allergic to it" (03/29/2017)  . History of blood transfusion 1980s   "related to minor sickle cell crisis" (03/29/2017)  . HLD (hyperlipidemia) 2009  . Hypertension   . Migraine headache    "q couple years now" (03/29/2017)  . Palpitation 07/18/2016  . Pancreatic divisum    S/P ERCP with stenting 07/2010 at [facility], then stent removal (08/05/2010)  . Pneumonia    "had it in my teens 3 times" (03/29/2017)  . Pulmonary embolus ([place]) 08/2004   Two areas of V/Q mismatch. Findings compatible  with high probability for pulmonary embolus.; pt was on coumadin for 1  year  . Seizures ([place]) 1993   "after the stroke in 1993; none for years now"  (03/29/2017)  . Sickle cell trait ([place])   . Thalassemia   . Type II diabetes mellitus ([place]) 2010   well controlled  . Uterine cancer [facility])     Past Surgical History:  Procedure Laterality Date  . ABDOMINAL HYSTERECTOMY  1980   2/2 endometriosis  . APPENDECTOMY  ?1980   "I think I've had it out" (11/20/2012)  . BREAST LUMPECTOMY Bilateral 1980's   "in my milk ducts; both benign" (02/27/2012)  . CORONARY ANGIOGRAM Bilateral 03/17/2011   Procedure: CORONARY ANGIOGRAM;  Surgeon: Jettie Booze, MD;  Location: [facility] CATH LAB;  Service: Cardiovascular;  Laterality: Bilateral;  . ERCP  07/2010   w/stent placement  at [facility], then stent removal (08/05/2010)  . ESOPHAGOGASTRODUODENOSCOPY N/A 10/26/2013   Procedure: ESOPHAGOGASTRODUODENOSCOPY (EGD);  Surgeon: Jerene Bears, MD;  Location: [facility] ENDOSCOPY;  Service: Endoscopy;  Laterality: N/A;  . Pancreatic stent placement/removal     placed in 2011; removed in 2012/H&P (02/27/2012); "I've had several" (03/29/2017)  . SPHINCTEROTOMY     Archie Endo 11/20/2012    There were no vitals filed for this visit.  Subjective Assessment - 01/02/19 0809    Subjective  The shoulder is coming along, No pain right now. A little sore for a couple days after last treatment.  Currently in Pain?  No/denies                       [facility] Adult PT Treatment/Exercise - 01/02/19 0001      Shoulder Exercises: Standing   External Rotation  Strengthening;Right;10 reps    Theraband Level (Shoulder External Rotation)  Level 2 (Red)    Flexion  Strengthening;Right;10 reps    Theraband Level (Shoulder Flexion)  Level 1 (Yellow)    Extension  Strengthening;Both;20 reps;Theraband    Theraband Level (Shoulder Extension)  Level 2 (Red)    Row  Strengthening;Both;20 reps;Theraband    Theraband Level (Shoulder Row)  Level 3 (Green)    Diagonals Limitations  too painful    Other  Standing Exercises  wall push ups [place] in / out x 10 each     Other Standing Exercises  shoulder raises 3 lbs x 10 forward and 3 lbs x 10 lateral       Shoulder Exercises: Pulleys   Flexion  --    Scaption  --      Shoulder Exercises: ROM/Strengthening   UBE (Upper Arm Bike)  6 min L1 each direction x 3 min       Shoulder Exercises: Stretch   Corner Stretch  3 reps;30 seconds    Internal Rotation Stretch  --   sleeper 10 s x 10   Internal Rotation Stretch Limitations  reach behind back, assist with other hand    painful      Moist Heat Therapy   Number Minutes Moist Heat  10 Minutes    Moist Heat Location  Shoulder      Manual Therapy   Manual Therapy  Passive ROM    Joint Mobilization  A/P and inferior glides     Soft tissue mobilization  Posterior shoulder     Passive ROM  PROM IR, ER              PT Education - 01/02/19 0936    Education Details  HEP    Person(s) Educated  Patient    Methods  Explanation;Handout    Comprehension  Verbalized understanding       PT Short Term Goals - 12/07/18 0810      PT SHORT TERM GOAL #1   Title  Pt will be I with initial HEP    Baseline  cues due to pain    Status  Partially Met      PT SHORT TERM GOAL #2   Title  Pt will be able to raise arm to shoulder height (flexion and abduction) , 100 deg with pain no more than 4/10 most of the time for ADLs, housework    Baseline  able to flex to 118 degree on 11/27/2018 but pain 7-8/10.    Status  Partially Met      PT SHORT TERM GOAL #3   Title  Pt will be able to use Rt arm to chop, cook and at waist height without increased pain    Baseline  some days she can with min to mod pain    Status  Partially Met        PT Long Term Goals - 12/19/18 1003      PT LONG TERM GOAL #1   Title  Pt will demonstrate full AROM of Rt UE within 20 deg of Lt UE for full mobility of upper body    Status  On-going      PT LONG TERM  GOAL #2   Title  Pt will be able to sleep without  interruption from pain most nights of the week.    Status  On-going      PT LONG TERM GOAL #3   Title  Pt will be able to ;get dressed, washed and do light home tasks without limitation from pain    Status  On-going      PT LONG TERM GOAL #4   Title  Pt will demo Rt UE strength to 4/5 or more for maximal shoulder function.    Status  On-going      PT LONG TERM GOAL #5   Title  Pt will be I with HEP upon discharge    Status  On-going      PT LONG TERM GOAL #6   Title  Pt will score FOTO limited no more than 35%    Status  On-going            Plan - 01/02/19 0845    Clinical Impression Statement  Pt arrives with no pain and reports improvement. She limited ROM with ER and IR active and passive. Updated HEP with stretches. Soft tissue worked performed to posterior shoulder with HMP following to decrease soreness. She may benefit from iontophoresis for posterior shoulder pain and soreness.    PT Next Visit Plan  ROM, strength, modalities for pain. Try closed chain (quad, wall), try ionto to posterior shoulder.    PT Home Exercise Plan  supine cane, table slides, red band ER/IR and horiz abd unattached, sleeper and corner stretch       Patient will benefit from skilled therapeutic intervention in order to improve the following deficits and impairments:  Pain, Decreased activity tolerance, Decreased range of motion, Decreased strength, Impaired UE functional use, Impaired flexibility, Decreased mobility  Visit Diagnosis: Muscle weakness (generalized)  Stiffness of right shoulder, not elsewhere classified  Acute pain of right shoulder     Problem List Patient Active Problem List   Diagnosis Date Noted  . Bursitis of right shoulder 09/19/2018  . Right calf pain 05/18/2018  . Arthralgia 01/19/2018  . Grief reaction 02/24/2017  . Encounter for screening mammogram for breast cancer 12/02/2016  . Chronic pancreatitis ([place]) 08/31/2016  . Colon cancer screening 05/23/2016  .  Opioid dependence ([place]) 04/28/2016  . Sickle cell trait ([place]) 11/16/2011  . Seizure disorder (history after stroke last sz late 1990s) 11/16/2011  . Beta thalassemia ([place]) 11/16/2011  . History of asthma 11/16/2011  . Iron deficiency anemia 11/16/2011  . Chronic abdominal pain   . Preventative health care 07/20/2010  . GERD 08/03/2007  . Congenital anomaly of pancreas 02/28/2006  . Hyperlipidemia 02/06/2006  . Essential hypertension 02/06/2006  . Diabetes type 2, controlled ([place]) 02/07/1992    Dorene Ar, PTA 01/02/2019, 9:48 AM  [facility] [address] [place], [place], [address] Phone: [phone]   Fax:  [phone]  Name: Quintavia Rogstad MRN: [id number] Date of Birth: October 22, 1955

## 2019-01-04 ENCOUNTER — Other Ambulatory Visit: Payer: Self-pay | Admitting: Internal Medicine

## 2019-01-04 ENCOUNTER — Ambulatory Visit: Payer: Medicare Other | Admitting: Physical Therapy

## 2019-01-04 ENCOUNTER — Encounter: Payer: Self-pay | Admitting: Physical Therapy

## 2019-01-04 ENCOUNTER — Other Ambulatory Visit: Payer: Self-pay

## 2019-01-04 DIAGNOSIS — M6281 Muscle weakness (generalized): Secondary | ICD-10-CM | POA: Diagnosis not present

## 2019-01-04 DIAGNOSIS — M25611 Stiffness of right shoulder, not elsewhere classified: Secondary | ICD-10-CM

## 2019-01-04 DIAGNOSIS — M25511 Pain in right shoulder: Secondary | ICD-10-CM

## 2019-01-04 DIAGNOSIS — Q453 Other congenital malformations of pancreas and pancreatic duct: Secondary | ICD-10-CM

## 2019-01-04 NOTE — Therapy (Signed)
[place], [place], [address] Phone: [phone]   Fax:  [phone]  Physical Therapy Treatment  Patient Details  Name: [name] MRN: [id number] Date of Birth: [birth year] Referring Provider (PT): Dr. Eunice Blase    Encounter Date: 01/04/2019  PT End of Session - 01/04/19 0717    Visit Number  9    Number of Visits  16    Date for PT Re-Evaluation  01/16/19    PT Start Time  0715    PT Stop Time  0800    PT Time Calculation (min)  45 min       Past Medical History:  Diagnosis Date  . Beta thalassemia ([place])   . Blood dyscrasia    thalasemia, sickle cell trait  . Childhood asthma   . Chronic abdominal pain    Unclear etiology, thought to be due to chronic pancreatitis previously in setting of her pancreatic divisum - however, EUS and EGD performed at [place]  (10/05/2011) showing normal esophageall, gastric, duodenal mucosa. EUS showing no pancreatic masses, cysts, or changes of chronic pancreatitis. Biliary system nondilated and had no endosonographic abnormalities.  . CVA (cerebral vascular accident) ([place]) 1993   Per report by patient she had a stroke and prolonged rehab course; still "weak on left side but not much" (03/29/2017)  . Family history of adverse reaction to anesthesia    "mom took a long while to wake up; she was allergic to it" (03/29/2017)  . History of blood transfusion 1980s   "related to minor sickle cell crisis" (03/29/2017)  . HLD (hyperlipidemia) 2009  . Hypertension   . Migraine headache    "q couple years now" (03/29/2017)  . Palpitation 07/18/2016  . Pancreatic divisum    S/P ERCP with stenting 07/2010 at [facility], then stent removal (08/05/2010)  . Pneumonia    "had it in my teens 3 times" (03/29/2017)  . Pulmonary embolus ([place]) 08/2004   Two areas of V/Q mismatch. Findings compatible  with high probability for pulmonary embolus.; pt was on coumadin for 1  year  . Seizures ([place]) 1993   "after the stroke in 1993; none for years now"  (03/29/2017)  . Sickle cell trait ([place])   . Thalassemia   . Type II diabetes mellitus ([place]) 2010   well controlled  . Uterine cancer [facility])     Past Surgical History:  Procedure Laterality Date  . ABDOMINAL HYSTERECTOMY  1980   2/2 endometriosis  . APPENDECTOMY  ?1980   "I think I've had it out" (11/20/2012)  . BREAST LUMPECTOMY Bilateral 1980's   "in my milk ducts; both benign" (02/27/2012)  . CORONARY ANGIOGRAM Bilateral 03/17/2011   Procedure: CORONARY ANGIOGRAM;  Surgeon: Jettie Booze, MD;  Location: [facility] CATH LAB;  Service: Cardiovascular;  Laterality: Bilateral;  . ERCP  07/2010   w/stent placement  at [facility], then stent removal (08/05/2010)  . ESOPHAGOGASTRODUODENOSCOPY N/A 10/26/2013   Procedure: ESOPHAGOGASTRODUODENOSCOPY (EGD);  Surgeon: Jerene Bears, MD;  Location: [facility] ENDOSCOPY;  Service: Endoscopy;  Laterality: N/A;  . Pancreatic stent placement/removal     placed in 2011; removed in 2012/H&P (02/27/2012); "I've had several" (03/29/2017)  . SPHINCTEROTOMY     Archie Endo 11/20/2012    There were no vitals filed for this visit.  Subjective Assessment - 01/04/19 0716    Subjective  Sore and achey after exercise then it calms down.    Currently in Pain?  No/denies    Aggravating Factors  over doing it    Pain Relieving Factors  asper cream         [facility] PT Assessment - 01/04/19 0001      AROM   Right Shoulder Flexion  118 Degrees    Right Shoulder ABduction  108 Degrees    Right Shoulder Internal Rotation  --   right side of buttock, L5 mid end of session   Right Shoulder External Rotation  --   Can touch T2 with difficulty     Strength   Overall Strength Comments  --   14, 11, 11 lb, RT hand                  [facility] Adult PT Treatment/Exercise - 01/04/19 0001      Shoulder Exercises: Standing   Extension  Strengthening;Both;20 reps;Theraband    Theraband Level  (Shoulder Extension)  Level 2 (Red)    Row  Strengthening;Both;20 reps;Theraband    Theraband Level (Shoulder Row)  Level 3 (Green)    Other Standing Exercises  wall slides flexion and abduction/scaption with pillow case       Shoulder Exercises: ROM/Strengthening   UBE (Upper Arm Bike)  6 min L1 each direction x 3 min     Other ROM/Strengthening Exercises  standing cane IR AAROM     Other ROM/Strengthening Exercises  wall ladder x 5each flexion and scaption    22nd rung for flex and 16 th for abduction     Shoulder Exercises: Stretch   Corner Stretch Limitations  high low and mid     Internal Rotation Stretch Limitations  towel in axilla and pull wrist using strap , then without towel roll for AAROM using opp hand, then using UE ranger for AAROM       Modalities   Modalities  Iontophoresis      Iontophoresis   Type of Iontophoresis  Dexamethasone    Location  Right posterior shoulder     Dose  54m    Time  6 hour patch               PT Short Term Goals - 01/04/19 02725     PT SHORT TERM GOAL #1   Title  Pt will be I with initial HEP    Baseline  cues due to pain    Time  4    Period  Weeks    Status  Partially Met      PT SHORT TERM GOAL #3   Title  Pt will be able to use Rt arm to chop, cook and at waist height without increased pain    Baseline  able to use electric can opener now but still difficulty with chopping        PT Long Term Goals - 12/19/18 1003      PT LONG TERM GOAL #1   Title  Pt will demonstrate full AROM of Rt UE within 20 deg of Lt UE for full mobility of upper body    Status  On-going      PT LONG TERM GOAL #2   Title  Pt will be able to sleep without interruption from pain most nights of the week.    Status  On-going      PT LONG TERM GOAL #3   Title  Pt will be able to ;get dressed, washed and do light home tasks without limitation from pain    Status  On-going      PT LONG  TERM GOAL #4   Title  Pt will demo Rt UE strength to 4/5  or more for maximal shoulder function.    Status  On-going      PT LONG TERM GOAL #5   Title  Pt will be I with HEP upon discharge    Status  On-going      PT LONG TERM GOAL #6   Title  Pt will score FOTO limited no more than 35%    Status  On-going            Plan - 01/04/19 0730    Clinical Impression Statement  Flexion ROM unchanged since last measure on 12/07/18. Updated HEP with  wall slides to increase flexion/abduction ROM. Reviewed ER /IR stretching. Towel in axilla with self mob follwed by AAROM using UE ranger increased AROM IR to mid back at L5. Trial of IONTO to right posterior shoulder.    PT Next Visit Plan  ROM, strength, modalities for pain. Try closed chain (quad, wall), assess ionto    PT Home Exercise Plan  supine cane, table slides, red band ER/IR and horiz abd unattached, sleeper and corner stretch, wall slides flexion and abduction(scaption)       Patient will benefit from skilled therapeutic intervention in order to improve the following deficits and impairments:  Pain, Decreased activity tolerance, Decreased range of motion, Decreased strength, Impaired UE functional use, Impaired flexibility, Decreased mobility  Visit Diagnosis: Muscle weakness (generalized)  Stiffness of right shoulder, not elsewhere classified  Acute pain of right shoulder     Problem List Patient Active Problem List   Diagnosis Date Noted  . Bursitis of right shoulder 09/19/2018  . Right calf pain 05/18/2018  . Arthralgia 01/19/2018  . Grief reaction 02/24/2017  . Encounter for screening mammogram for breast cancer 12/02/2016  . Chronic pancreatitis ([place]) 08/31/2016  . Colon cancer screening 05/23/2016  . Opioid dependence ([place]) 04/28/2016  . Sickle cell trait ([place]) 11/16/2011  . Seizure disorder (history after stroke last sz late 1990s) 11/16/2011  . Beta thalassemia ([place]) 11/16/2011  . History of asthma 11/16/2011  . Iron deficiency anemia 11/16/2011  . Chronic  abdominal pain   . Preventative health care 07/20/2010  . GERD 08/03/2007  . Congenital anomaly of pancreas 02/28/2006  . Hyperlipidemia 02/06/2006  . Essential hypertension 02/06/2006  . Diabetes type 2, controlled ([place]) 02/07/1992    Dorene Ar, PTA 01/04/2019, 8:23 AM  [facility] [address] [place], [place], [address] Phone: [phone]   Fax:  [phone]  Name: [name] MRN: [id number] Date of Birth: 1955-08-30

## 2019-01-04 NOTE — Telephone Encounter (Signed)
REFILL REQUEST  promethazine (PHENERGAN) 12.5 MG tablet  [facility] [id number], [place] [phone] (Phone) [phone] (Fax

## 2019-01-04 NOTE — Telephone Encounter (Signed)
Called / informed pt Promethazine was refilled 8/3 x 2 refills and to call the pharmacy. Stated she will.

## 2019-01-14 ENCOUNTER — Ambulatory Visit: Payer: Medicare Other | Admitting: Physical Therapy

## 2019-01-14 ENCOUNTER — Other Ambulatory Visit: Payer: Self-pay

## 2019-01-14 ENCOUNTER — Encounter: Payer: Self-pay | Admitting: Physical Therapy

## 2019-01-14 DIAGNOSIS — M6281 Muscle weakness (generalized): Secondary | ICD-10-CM

## 2019-01-14 DIAGNOSIS — M25611 Stiffness of right shoulder, not elsewhere classified: Secondary | ICD-10-CM

## 2019-01-14 DIAGNOSIS — M25511 Pain in right shoulder: Secondary | ICD-10-CM

## 2019-01-14 NOTE — Therapy (Addendum)
[place], [place], [address] Phone: [phone]   Fax:  [phone]  Physical Therapy Treatment Re-certification  Patient Details  Name: [name] MRN: [id number] Date of Birth: 1955-09-25 Referring Provider (PT): Dr. Eunice Blase   Encounter Date: 01/14/2019  PT End of Session - 01/14/19 0835    Visit Number  10    Number of Visits  19    Date for PT Re-Evaluation  01/16/19    Authorization Type  Re-cert sent on 12/28/9369, requested 8 visits starting next week.    PT Start Time  0830    PT Stop Time  0915    PT Time Calculation (min)  45 min    Activity Tolerance  Patient tolerated treatment well    Behavior During Therapy  WFL for tasks assessed/performed       Past Medical History:  Diagnosis Date  . Beta thalassemia ([place])   . Blood dyscrasia    thalasemia, sickle cell trait  . Childhood asthma   . Chronic abdominal pain    Unclear etiology, thought to be due to chronic pancreatitis previously in setting of her pancreatic divisum - however, EUS and EGD performed at [place]  (10/05/2011) showing normal esophageall, gastric, duodenal mucosa. EUS showing no pancreatic masses, cysts, or changes of chronic pancreatitis. Biliary system nondilated and had no endosonographic abnormalities.  . CVA (cerebral vascular accident) ([place]) 1993   Per report by patient she had a stroke and prolonged rehab course; still "weak on left side but not much" (03/29/2017)  . Family history of adverse reaction to anesthesia    "mom took a long while to wake up; she was allergic to it" (03/29/2017)  . History of blood transfusion 1980s   "related to minor sickle cell crisis" (03/29/2017)  . HLD (hyperlipidemia) 2009  . Hypertension   . Migraine headache    "q couple years now" (03/29/2017)  . Palpitation 07/18/2016  . Pancreatic divisum    S/P ERCP with stenting 07/2010 at [facility], then stent removal  (08/05/2010)  . Pneumonia    "had it in my teens 3 times" (03/29/2017)  . Pulmonary embolus ([place]) 08/2004   Two areas of V/Q mismatch. Findings compatible  with high probability for pulmonary embolus.; pt was on coumadin for 1 year  . Seizures ([place]) 1993   "after the stroke in 1993; none for years now"  (03/29/2017)  . Sickle cell trait ([place])   . Thalassemia   . Type II diabetes mellitus ([place]) 2010   well controlled  . Uterine cancer [facility])     Past Surgical History:  Procedure Laterality Date  . ABDOMINAL HYSTERECTOMY  1980   2/2 endometriosis  . APPENDECTOMY  ?1980   "I think I've had it out" (11/20/2012)  . BREAST LUMPECTOMY Bilateral 1980's   "in my milk ducts; both benign" (02/27/2012)  . CORONARY ANGIOGRAM Bilateral 03/17/2011   Procedure: CORONARY ANGIOGRAM;  Surgeon: Jettie Booze, MD;  Location: [facility] CATH LAB;  Service: Cardiovascular;  Laterality: Bilateral;  . ERCP  07/2010   w/stent placement  at [facility], then stent removal (08/05/2010)  . ESOPHAGOGASTRODUODENOSCOPY N/A 10/26/2013   Procedure: ESOPHAGOGASTRODUODENOSCOPY (EGD);  Surgeon: Jerene Bears, MD;  Location: [facility] ENDOSCOPY;  Service: Endoscopy;  Laterality: N/A;  . Pancreatic stent placement/removal     placed in 2011; removed in 2012/H&P (02/27/2012); "I've had several" (03/29/2017)  . SPHINCTEROTOMY     Archie Endo 11/20/2012    There were no vitals  filed for this visit.  Subjective Assessment - 01/14/19 0829    Subjective  Pt arriving reporting soreness in R shoulder but she feels like her range is better.    Pertinent History  diabetes, CVA, sickle cell trait, pancreatits    Limitations  Lifting;Writing;House hold activities    Patient Stated Goals  Patient would like to be able to use her arm , less pain, sleep better    Currently in Pain?  Yes    Pain Score  1     Pain Orientation  Right    Pain Descriptors / Indicators  Aching    Pain Type  Chronic pain    Pain Onset  More than a month ago          [facility] PT Assessment - 01/14/19 0001      Assessment   Medical Diagnosis  R shoulder pain    Referring Provider (PT)  Dr. Legrand Como [place]    Hand Dominance  Right    Prior Therapy  no      Precautions   Precautions  None      Restrictions   Weight Bearing Restrictions  No      Balance Screen   Has the patient fallen in the past 6 months  No      AROM   Right Shoulder Flexion  120 Degrees    Right Shoulder ABduction  110 Degrees    Right Shoulder External Rotation  50 Degrees      Strength   Right Shoulder Flexion  3+/5    Right Shoulder ABduction  3/5    Right Elbow Flexion  3+/5                   [facility] Adult PT Treatment/Exercise - 01/14/19 0001      Shoulder Exercises: Standing   External Rotation  Strengthening;Right;Theraband    Theraband Level (Shoulder External Rotation)  Level 2 (Red)    Flexion  Strengthening;Right;Theraband    Theraband Level (Shoulder Flexion)  Level 2 (Red)    Extension  Strengthening    Theraband Level (Shoulder Extension)  Level 2 (Red)    Row  Strengthening;Both;20 reps;Theraband    Theraband Level (Shoulder Row)  Level 3 (Green)    Other Standing Exercises  wall ladder #24, 130 degrees flexion    Other Standing Exercises  UE ranger, circles, clockwise and counterclockwise      Shoulder Exercises: ROM/Strengthening   UBE (Upper Arm Bike)  6 min L1 each direction x 3 min     Other ROM/Strengthening Exercises  standing cane IR AAROM              PT Education - 01/14/19 0915    Education Details  Continue with HEP    Person(s) Educated  Patient    Methods  Explanation    Comprehension  Verbalized understanding       PT Short Term Goals - 01/14/19 0837      PT SHORT TERM GOAL #1   Title  Pt will be I with initial HEP    Baseline  pt reports doing them as much as she can with no questions    Time  4    Period  Weeks    Status  Achieved    Target Date  12/19/18      PT SHORT TERM GOAL #2   Title  Pt  will be able to raise arm to shoulder height (flexion and abduction) , 100 deg  with pain no more than 4/10 most of the time for ADLs, housework    Baseline  able to flex to 120, pt reporting pain of 6/10    Time  4    Period  Weeks    Status  Partially Met    Target Date  03/04/19      PT SHORT TERM GOAL #3   Title  Pt will be able to use Rt arm to chop, cook and at waist height without increased pain    Baseline  Pt still reporting pain and but states she needs to stop after about 1 minute of cooking or chopping    Time  4    Period  Weeks    Status  Partially Met    Target Date  03/04/19        PT Long Term Goals - 01/14/19 0843      PT LONG TERM GOAL #1   Baseline  Lt UE flexion 160 , ER 90 and IR 90 deg, R UE: flexion=120, ER= 50    Time  8    Period  Weeks    Status  On-going    Target Date  03/04/19      PT LONG TERM GOAL #2   Title  Pt will be able to sleep without interruption from pain most nights of the week.    Baseline  Pt reports she is still waking up most nights with pain in R shoulder.    Time  8    Period  Weeks    Status  On-going      PT LONG TERM GOAL #3   Title  Pt will be able to ;get dressed, washed and do light home tasks without limitation from pain    Baseline  Still reporting pain but reports her pain has improved since beginning therapy.    Time  8    Period  Weeks    Status  On-going    Target Date  03/04/19      PT LONG TERM GOAL #4   Title  Pt will demo Rt UE strength to 4/5 or more for maximal shoulder function.    Baseline  3+/5 R UE shoulder flexion    Time  8    Period  Weeks    Status  On-going    Target Date  03/04/19      PT LONG TERM GOAL #5   Title  Pt will be I with HEP upon discharge    Time  8    Period  Weeks    Status  On-going      PT LONG TERM GOAL #6   Title  Pt will score FOTO limited no more than 35%    Time  8    Period  Weeks    Status  On-going    Target Date  03/04/19            Plan - 01/14/19  0916    Clinical Impression Statement  Pt has improved with AAROM in R shoulder flexion to 130 degrees, AROM 120 R shoulder flexion. Pt feels like her pain has improved some, but still has difficutly with sleeping and ADL's. Requesting 8 more PT visits to progress pt toward goals not met.    Personal Factors and Comorbidities  Comorbidity 1;Comorbidity 2    Comorbidities  diabetes, pancreatitis    Examination-Activity Limitations  Lift;Bed Mobility;Transfers;Reach Overhead;Hygiene/Grooming;Dressing    Examination-Participation Restrictions  Laundry;Community Activity;Yard Work;Cleaning;Meal Prep  Stability/Clinical Decision Making  Evolving/Moderate complexity    Rehab Potential  Excellent    PT Frequency  2x / week    PT Duration  4 weeks    PT Treatment/Interventions  ADLs/Self Care Home Management;Iontophoresis '4mg'$ /ml Dexamethasone;Passive range of motion;Ultrasound;Cryotherapy;Electrical Stimulation;Therapeutic exercise;Therapeutic activities;Patient/family education;Manual techniques;Taping;Moist Heat;Functional mobility training    PT Next Visit Plan  ROM, strength, modalities for pain. Try closed chain (quad, wall), assess ionto    PT Home Exercise Plan  supine cane, table slides, red band ER/IR and horiz abd unattached, sleeper and corner stretch, wall slides flexion and abduction(scaption)    Consulted and Agree with Plan of Care  Patient       Patient will benefit from skilled therapeutic intervention in order to improve the following deficits and impairments:  Pain, Decreased activity tolerance, Decreased range of motion, Decreased strength, Impaired UE functional use, Impaired flexibility, Decreased mobility  Visit Diagnosis: Muscle weakness (generalized) - Plan: PT plan of care cert/re-cert  Stiffness of right shoulder, not elsewhere classified - Plan: PT plan of care cert/re-cert  Acute pain of right shoulder - Plan: PT plan of care cert/re-cert     Problem List Patient  Active Problem List   Diagnosis Date Noted  . Bursitis of right shoulder 09/19/2018  . Right calf pain 05/18/2018  . Arthralgia 01/19/2018  . Grief reaction 02/24/2017  . Encounter for screening mammogram for breast cancer 12/02/2016  . Chronic pancreatitis ([place]) 08/31/2016  . Colon cancer screening 05/23/2016  . Opioid dependence ([place]) 04/28/2016  . Sickle cell trait ([place]) 11/16/2011  . Seizure disorder (history after stroke last sz late 1990s) 11/16/2011  . Beta thalassemia ([place]) 11/16/2011  . History of asthma 11/16/2011  . Iron deficiency anemia 11/16/2011  . Chronic abdominal pain   . Preventative health care 07/20/2010  . GERD 08/03/2007  . Congenital anomaly of pancreas 02/28/2006  . Hyperlipidemia 02/06/2006  . Essential hypertension 02/06/2006  . Diabetes type 2, controlled ([place]) 02/07/1992    Oretha Caprice, PT 01/14/2019, 9:25 AM  [facility] [address] [place], [place], [address] Phone: [phone]   Fax:  [phone]  Name: [name] MRN: [id number] Date of Birth: 07-Nov-1955

## 2019-01-15 ENCOUNTER — Ambulatory Visit: Payer: Medicare Other | Admitting: Physical Therapy

## 2019-01-15 DIAGNOSIS — M25511 Pain in right shoulder: Secondary | ICD-10-CM

## 2019-01-15 DIAGNOSIS — M25611 Stiffness of right shoulder, not elsewhere classified: Secondary | ICD-10-CM

## 2019-01-15 DIAGNOSIS — M6281 Muscle weakness (generalized): Secondary | ICD-10-CM

## 2019-01-15 NOTE — Therapy (Signed)
[place], [place], [address] Phone: [phone]   Fax:  [phone]  Physical Therapy Treatment  Patient Details  Name: [name] MRN: [id number] Date of Birth: 1956/02/19 Referring Provider (PT): Dr. Eunice Blase   Encounter Date: 01/15/2019  PT End of Session - 01/15/19 0909    Visit Number  11    Number of Visits  19    Date for PT Re-Evaluation  03/04/19    Authorization Type  Re-cert sent on 2/72/5366, requested 8 visits starting next week.    PT Start Time  0830    PT Stop Time  0915    PT Time Calculation (min)  45 min    Activity Tolerance  Patient tolerated treatment well    Behavior During Therapy  WFL for tasks assessed/performed       Past Medical History:  Diagnosis Date  . Beta thalassemia ([place])   . Blood dyscrasia    thalasemia, sickle cell trait  . Childhood asthma   . Chronic abdominal pain    Unclear etiology, thought to be due to chronic pancreatitis previously in setting of her pancreatic divisum - however, EUS and EGD performed at [facility]  (10/05/2011) showing normal esophageall, gastric, duodenal mucosa. EUS showing no pancreatic masses, cysts, or changes of chronic pancreatitis. Biliary system nondilated and had no endosonographic abnormalities.  . CVA (cerebral vascular accident) ([place]) 1993   Per report by patient she had a stroke and prolonged rehab course; still "weak on left side but not much" (03/29/2017)  . Family history of adverse reaction to anesthesia    "mom took a long while to wake up; she was allergic to it" (03/29/2017)  . History of blood transfusion 1980s   "related to minor sickle cell crisis" (03/29/2017)  . HLD (hyperlipidemia) 2009  . Hypertension   . Migraine headache    "q couple years now" (03/29/2017)  . Palpitation 07/18/2016  . Pancreatic divisum    S/P ERCP with stenting 07/2010 at [facility], then stent removal (08/05/2010)  .  Pneumonia    "had it in my teens 3 times" (03/29/2017)  . Pulmonary embolus ([place]) 08/2004   Two areas of V/Q mismatch. Findings compatible  with high probability for pulmonary embolus.; pt was on coumadin for 1 year  . Seizures ([place]) 1993   "after the stroke in 1993; none for years now"  (03/29/2017)  . Sickle cell trait ([place])   . Thalassemia   . Type II diabetes mellitus ([place]) 2010   well controlled  . Uterine cancer [facility])     Past Surgical History:  Procedure Laterality Date  . ABDOMINAL HYSTERECTOMY  1980   2/2 endometriosis  . APPENDECTOMY  ?1980   "I think I've had it out" (11/20/2012)  . BREAST LUMPECTOMY Bilateral 1980's   "in my milk ducts; both benign" (02/27/2012)  . CORONARY ANGIOGRAM Bilateral 03/17/2011   Procedure: CORONARY ANGIOGRAM;  Surgeon: Jettie Booze, MD;  Location: [facility] CATH LAB;  Service: Cardiovascular;  Laterality: Bilateral;  . ERCP  07/2010   w/stent placement  at [facility], then stent removal (08/05/2010)  . ESOPHAGOGASTRODUODENOSCOPY N/A 10/26/2013   Procedure: ESOPHAGOGASTRODUODENOSCOPY (EGD);  Surgeon: Jerene Bears, MD;  Location: [facility] ENDOSCOPY;  Service: Endoscopy;  Laterality: N/A;  . Pancreatic stent placement/removal     placed in 2011; removed in 2012/H&P (02/27/2012); "I've had several" (03/29/2017)  . SPHINCTEROTOMY     Archie Endo 11/20/2012    There were no vitals filed  for this visit.  Subjective Assessment - 01/15/19 0843    Subjective  Pt arriving reporting "stiffness and 5-6/10 pain in her R shoulder, but it is coming along"    Pertinent History  diabetes, CVA, sickle cell trait, pancreatits    Limitations  Lifting;Writing;House hold activities    Patient Stated Goals  Patient would like to be able to use her arm , less pain, sleep better    Pain Onset  More than a month ago                       [facility] Adult PT Treatment/Exercise - 01/15/19 0001      Shoulder Exercises: Supine   Flexion  AAROM;Both;10 reps     Shoulder Flexion Weight (lbs)  3    Other Supine Exercises  chest press with wand and 3 lb cuff wt  X10      Shoulder Exercises: Standing   External Rotation  Strengthening;Right;Theraband;20 reps    Theraband Level (Shoulder External Rotation)  Level 2 (Red)    Internal Rotation  Strengthening;Right;20 reps    Theraband Level (Shoulder Internal Rotation)  Level 2 (Red)    Extension  Strengthening;20 reps    Theraband Level (Shoulder Extension)  Level 2 (Red)    Row  Strengthening;Both;20 reps;Theraband    Theraband Level (Shoulder Row)  Level 2 (Red)    Other Standing Exercises  wall ladder X 5 ea for flexion and scaption    Other Standing Exercises  UE ranger, flexion, scaption, circles, clockwise and counterclockwise      Shoulder Exercises: Pulleys   Flexion  2 minutes    Scaption  1 minute      Shoulder Exercises: ROM/Strengthening   UBE (Upper Arm Bike)  4 min total 2 min fwd, 2 min backward      Shoulder Exercises: Stretch   Internal Rotation Stretch Limitations  towel behind back 10 sec X 10 reps      Iontophoresis   Type of Iontophoresis  Dexamethasone    Location  Right posterior shoulder     Dose  41m    Time  6 hour patch      Manual Therapy   Manual therapy comments  Rt shoulder PROM all planes, and GH mobs all to tolerance               PT Short Term Goals - 01/14/19 0837      PT SHORT TERM GOAL #1   Title  Pt will be I with initial HEP    Baseline  pt reports doing them as much as she can with no questions    Time  4    Period  Weeks    Status  Achieved    Target Date  12/19/18      PT SHORT TERM GOAL #2   Title  Pt will be able to raise arm to shoulder height (flexion and abduction) , 100 deg with pain no more than 4/10 most of the time for ADLs, housework    Baseline  able to flex to 120, pt reporting pain of 6/10    Time  4    Period  Weeks    Status  Partially Met    Target Date  03/04/19      PT SHORT TERM GOAL #3   Title  Pt will be  able to use Rt arm to chop, cook and at waist height without increased pain  Baseline  Pt still reporting pain and but states she needs to stop after about 1 minute of cooking or chopping    Time  4    Period  Weeks    Status  Partially Met    Target Date  03/04/19        PT Long Term Goals - 01/14/19 0843      PT LONG TERM GOAL #1   Baseline  Lt UE flexion 160 , ER 90 and IR 90 deg, R UE: flexion=120, ER= 50    Time  8    Period  Weeks    Status  On-going    Target Date  03/04/19      PT LONG TERM GOAL #2   Title  Pt will be able to sleep without interruption from pain most nights of the week.    Baseline  Pt reports she is still waking up most nights with pain in R shoulder.    Time  8    Period  Weeks    Status  On-going      PT LONG TERM GOAL #3   Title  Pt will be able to ;get dressed, washed and do light home tasks without limitation from pain    Baseline  Still reporting pain but reports her pain has improved since beginning therapy.    Time  8    Period  Weeks    Status  On-going    Target Date  03/04/19      PT LONG TERM GOAL #4   Title  Pt will demo Rt UE strength to 4/5 or more for maximal shoulder function.    Baseline  3+/5 R UE shoulder flexion    Time  8    Period  Weeks    Status  On-going    Target Date  03/04/19      PT LONG TERM GOAL #5   Title  Pt will be I with HEP upon discharge    Time  8    Period  Weeks    Status  On-going      PT LONG TERM GOAL #6   Title  Pt will score FOTO limited no more than 35%    Time  8    Period  Weeks    Status  On-going    Target Date  03/04/19            Plan - 01/15/19 0919    Clinical Impression Statement  She is slowly progressing with PT but still lacks ROM, strength, and functional use of her Rt arm. PT will continue to progress this as able. Applied ionto patch to reduce pain after agressive stretching today as tolerated.    Personal Factors and Comorbidities  Comorbidity 1;Comorbidity 2     Comorbidities  diabetes, pancreatitis    Examination-Activity Limitations  Lift;Bed Mobility;Transfers;Reach Overhead;Hygiene/Grooming;Dressing    Examination-Participation Restrictions  Laundry;Community Activity;Yard Work;Cleaning;Meal Prep    Stability/Clinical Decision Making  Evolving/Moderate complexity    Rehab Potential  Excellent    PT Frequency  2x / week    PT Duration  4 weeks    PT Treatment/Interventions  ADLs/Self Care Home Management;Iontophoresis '4mg'$ /ml Dexamethasone;Passive range of motion;Ultrasound;Cryotherapy;Electrical Stimulation;Therapeutic exercise;Therapeutic activities;Patient/family education;Manual techniques;Taping;Moist Heat;Functional mobility training    PT Next Visit Plan  ROM, strength, modalities for pain. Try closed chain (quad, wall), assess ionto    PT Home Exercise Plan  supine cane, table slides, red band ER/IR and horiz abd unattached, sleeper  and corner stretch, wall slides flexion and abduction(scaption)    Consulted and Agree with Plan of Care  Patient       Patient will benefit from skilled therapeutic intervention in order to improve the following deficits and impairments:  Pain, Decreased activity tolerance, Decreased range of motion, Decreased strength, Impaired UE functional use, Impaired flexibility, Decreased mobility  Visit Diagnosis: Muscle weakness (generalized)  Stiffness of right shoulder, not elsewhere classified  Acute pain of right shoulder     Problem List Patient Active Problem List   Diagnosis Date Noted  . Bursitis of right shoulder 09/19/2018  . Right calf pain 05/18/2018  . Arthralgia 01/19/2018  . Grief reaction 02/24/2017  . Encounter for screening mammogram for breast cancer 12/02/2016  . Chronic pancreatitis ([place]) 08/31/2016  . Colon cancer screening 05/23/2016  . Opioid dependence ([place]) 04/28/2016  . Sickle cell trait ([place]) 11/16/2011  . Seizure disorder (history after stroke last sz late 1990s) 11/16/2011   . Beta thalassemia ([place]) 11/16/2011  . History of asthma 11/16/2011  . Iron deficiency anemia 11/16/2011  . Chronic abdominal pain   . Preventative health care 07/20/2010  . GERD 08/03/2007  . Congenital anomaly of pancreas 02/28/2006  . Hyperlipidemia 02/06/2006  . Essential hypertension 02/06/2006  . Diabetes type 2, controlled ([place]) 02/07/1992    Silvestre Mesi 01/15/2019, 9:28 AM  [facility] [address] [place], [place], [address] Phone: [phone]   Fax:  [phone]  Name: [name] MRN: [id number] Date of Birth: 1955-09-26

## 2019-01-21 ENCOUNTER — Encounter: Payer: Self-pay | Admitting: Physical Therapy

## 2019-01-21 ENCOUNTER — Ambulatory Visit: Payer: Medicare Other | Admitting: Physical Therapy

## 2019-01-21 ENCOUNTER — Other Ambulatory Visit: Payer: Self-pay

## 2019-01-21 DIAGNOSIS — M6281 Muscle weakness (generalized): Secondary | ICD-10-CM

## 2019-01-21 DIAGNOSIS — M25611 Stiffness of right shoulder, not elsewhere classified: Secondary | ICD-10-CM

## 2019-01-21 DIAGNOSIS — M25511 Pain in right shoulder: Secondary | ICD-10-CM

## 2019-01-21 NOTE — Therapy (Signed)
[place], [place], [address] Phone: [phone]   Fax:  [phone]  Physical Therapy Treatment  Patient Details  Name: [name] MRN: [id number] Date of Birth: Sep 05, 1955 Referring Provider (PT): Dr. Eunice Blase   Encounter Date: 01/21/2019  PT End of Session - 01/21/19 0837    Visit Number  12    Number of Visits  19    Date for PT Re-Evaluation  03/04/19    Authorization Type  Re-cert sent on 2/72/5366, requested 8 visits starting next week.    PT Start Time  0830    PT Stop Time  0910    PT Time Calculation (min)  40 min    Activity Tolerance  Patient tolerated treatment well    Behavior During Therapy  WFL for tasks assessed/performed       Past Medical History:  Diagnosis Date  . Beta thalassemia ([place])   . Blood dyscrasia    thalasemia, sickle cell trait  . Childhood asthma   . Chronic abdominal pain    Unclear etiology, thought to be due to chronic pancreatitis previously in setting of her pancreatic divisum - however, EUS and EGD performed at [place]  (10/05/2011) showing normal esophageall, gastric, duodenal mucosa. EUS showing no pancreatic masses, cysts, or changes of chronic pancreatitis. Biliary system nondilated and had no endosonographic abnormalities.  . CVA (cerebral vascular accident) ([place]) 1993   Per report by patient she had a stroke and prolonged rehab course; still "weak on left side but not much" (03/29/2017)  . Family history of adverse reaction to anesthesia    "mom took a long while to wake up; she was allergic to it" (03/29/2017)  . History of blood transfusion 1980s   "related to minor sickle cell crisis" (03/29/2017)  . HLD (hyperlipidemia) 2009  . Hypertension   . Migraine headache    "q couple years now" (03/29/2017)  . Palpitation 07/18/2016  . Pancreatic divisum    S/P ERCP with stenting 07/2010 at [facility], then stent removal (08/05/2010)  .  Pneumonia    "had it in my teens 3 times" (03/29/2017)  . Pulmonary embolus ([place]) 08/2004   Two areas of V/Q mismatch. Findings compatible  with high probability for pulmonary embolus.; pt was on coumadin for 1 year  . Seizures ([place]) 1993   "after the stroke in 1993; none for years now"  (03/29/2017)  . Sickle cell trait ([place])   . Thalassemia   . Type II diabetes mellitus ([place]) 2010   well controlled  . Uterine cancer [facility])     Past Surgical History:  Procedure Laterality Date  . ABDOMINAL HYSTERECTOMY  1980   2/2 endometriosis  . APPENDECTOMY  ?1980   "I think I've had it out" (11/20/2012)  . BREAST LUMPECTOMY Bilateral 1980's   "in my milk ducts; both benign" (02/27/2012)  . CORONARY ANGIOGRAM Bilateral 03/17/2011   Procedure: CORONARY ANGIOGRAM;  Surgeon: Jettie Booze, MD;  Location: [facility] CATH LAB;  Service: Cardiovascular;  Laterality: Bilateral;  . ERCP  07/2010   w/stent placement  at [facility], then stent removal (08/05/2010)  . ESOPHAGOGASTRODUODENOSCOPY N/A 10/26/2013   Procedure: ESOPHAGOGASTRODUODENOSCOPY (EGD);  Surgeon: Jerene Bears, MD;  Location: [facility] ENDOSCOPY;  Service: Endoscopy;  Laterality: N/A;  . Pancreatic stent placement/removal     placed in 2011; removed in 2012/H&P (02/27/2012); "I've had several" (03/29/2017)  . SPHINCTEROTOMY     Archie Endo 11/20/2012    There were no vitals filed  for this visit.  Subjective Assessment - 01/21/19 0835    Subjective  Pt arriving to therapy reporting  5/10 pain in R shoulder. Pt reporting achiness.    Pertinent History  diabetes, CVA, sickle cell trait, pancreatits    Limitations  Lifting;Writing;House hold activities    Patient Stated Goals  Patient would like to be able to use her arm , less pain, sleep better    Currently in Pain?  Yes    Pain Score  5     Pain Orientation  Right    Pain Onset  More than a month ago                       [facility] Adult PT Treatment/Exercise - 01/21/19 0001       Shoulder Exercises: Supine   Flexion  AAROM;Both;10 reps    Shoulder Flexion Weight (lbs)  3    Other Supine Exercises  chest press with wand and 3 lb cuff wt  X10      Shoulder Exercises: Standing   External Rotation  Strengthening;Right;Theraband;20 reps    Theraband Level (Shoulder External Rotation)  Level 2 (Red)    Internal Rotation  Strengthening;Right;20 reps    Theraband Level (Shoulder Internal Rotation)  Level 2 (Red)    Extension  Strengthening;20 reps    Theraband Level (Shoulder Extension)  Level 2 (Red)    Row  Strengthening;Both;20 reps;Theraband    Theraband Level (Shoulder Row)  Level 2 (Red)   attempted green theraband today, pt reporting increased pain   Other Standing Exercises  wall ladder X 5 ea for flexion and scaption    Other Standing Exercises  UE ranger, flexion, scaption, circles, clockwise and counterclockwise      Shoulder Exercises: ROM/Strengthening   UBE (Upper Arm Bike)  6 min total L1.2, 3 min fwd, 2 min backward      Shoulder Exercises: Stretch   Internal Rotation Stretch Limitations  towel behind back 10 sec X 10 reps      Iontophoresis   Type of Iontophoresis  Dexamethasone    Location  Right posterior shoulder     Dose  65m    Time  6 hour patch      Manual Therapy   Manual therapy comments  Rt shoulder PROM all planes, and GH mobs all to tolerance               PT Short Term Goals - 01/21/19 0846      PT SHORT TERM GOAL #1   Title  Pt will be I with initial HEP    Baseline  pt reports doing them as much as she can with no questions    Time  4    Period  Weeks    Status  Achieved    Target Date  12/19/18      PT SHORT TERM GOAL #2   Title  Pt will be able to raise arm to shoulder height (flexion and abduction) , 100 deg with pain no more than 4/10 most of the time for ADLs, housework    Baseline  able to flex to 120, pt reporting pain of 6/10    Time  4    Period  Weeks    Status  Partially Met    Target Date  03/04/19       PT SHORT TERM GOAL #3   Title  Pt will be able to use Rt arm to chop, cook  and at waist height without increased pain    Baseline  Pt still reporting pain and but states she needs to stop after about 1 minute of cooking or chopping    Time  4    Period  Weeks    Status  Partially Met    Target Date  03/04/19        PT Long Term Goals - 01/14/19 0843      PT LONG TERM GOAL #1   Baseline  Lt UE flexion 160 , ER 90 and IR 90 deg, R UE: flexion=120, ER= 50    Time  8    Period  Weeks    Status  On-going    Target Date  03/04/19      PT LONG TERM GOAL #2   Title  Pt will be able to sleep without interruption from pain most nights of the week.    Baseline  Pt reports she is still waking up most nights with pain in R shoulder.    Time  8    Period  Weeks    Status  On-going      PT LONG TERM GOAL #3   Title  Pt will be able to ;get dressed, washed and do light home tasks without limitation from pain    Baseline  Still reporting pain but reports her pain has improved since beginning therapy.    Time  8    Period  Weeks    Status  On-going    Target Date  03/04/19      PT LONG TERM GOAL #4   Title  Pt will demo Rt UE strength to 4/5 or more for maximal shoulder function.    Baseline  3+/5 R UE shoulder flexion    Time  8    Period  Weeks    Status  On-going    Target Date  03/04/19      PT LONG TERM GOAL #5   Title  Pt will be I with HEP upon discharge    Time  8    Period  Weeks    Status  On-going      PT LONG TERM GOAL #6   Title  Pt will score FOTO limited no more than 35%    Time  8    Period  Weeks    Status  On-going    Target Date  03/04/19            Plan - 01/21/19 6734    Clinical Impression Statement  Pt arriving to therapy reporting she has to leave for another appointment a few minutes early. Pt reporting 5/10 pain at beginnin of session. Pt also reporting taking a tylenol prior to coming in today. Pt tolreating excises with muscle fatigue  noted while performing band exercises. Iontopatch applied. Pt feels it's helping. continue with skilled PT.    Personal Factors and Comorbidities  Comorbidity 1;Comorbidity 2    Comorbidities  diabetes, pancreatitis    Examination-Activity Limitations  Lift;Bed Mobility;Transfers;Reach Overhead;Hygiene/Grooming;Dressing    Examination-Participation Restrictions  Laundry;Community Activity;Yard Work;Cleaning;Meal Prep    Stability/Clinical Decision Making  Evolving/Moderate complexity    Rehab Potential  Excellent    PT Frequency  2x / week    PT Duration  4 weeks    PT Treatment/Interventions  ADLs/Self Care Home Management;Iontophoresis 109m/ml Dexamethasone;Passive range of motion;Ultrasound;Cryotherapy;Electrical Stimulation;Therapeutic exercise;Therapeutic activities;Patient/family education;Manual techniques;Taping;Moist Heat;Functional mobility training    PT Next Visit Plan  ROM, strength, modalities for  pain. Try closed chain (quad, wall), assess ionto    PT Home Exercise Plan  supine cane, table slides, red band ER/IR and horiz abd unattached, sleeper and corner stretch, wall slides flexion and abduction(scaption)    Consulted and Agree with Plan of Care  Patient       Patient will benefit from skilled therapeutic intervention in order to improve the following deficits and impairments:  Pain, Decreased activity tolerance, Decreased range of motion, Decreased strength, Impaired UE functional use, Impaired flexibility, Decreased mobility  Visit Diagnosis: Muscle weakness (generalized)  Stiffness of right shoulder, not elsewhere classified  Acute pain of right shoulder     Problem List Patient Active Problem List   Diagnosis Date Noted  . Bursitis of right shoulder 09/19/2018  . Right calf pain 05/18/2018  . Arthralgia 01/19/2018  . Grief reaction 02/24/2017  . Encounter for screening mammogram for breast cancer 12/02/2016  . Chronic pancreatitis ([place]) 08/31/2016  . Colon  cancer screening 05/23/2016  . Opioid dependence ([place]) 04/28/2016  . Sickle cell trait ([place]) 11/16/2011  . Seizure disorder (history after stroke last sz late 1990s) 11/16/2011  . Beta thalassemia ([place]) 11/16/2011  . History of asthma 11/16/2011  . Iron deficiency anemia 11/16/2011  . Chronic abdominal pain   . Preventative health care 07/20/2010  . GERD 08/03/2007  . Congenital anomaly of pancreas 02/28/2006  . Hyperlipidemia 02/06/2006  . Essential hypertension 02/06/2006  . Diabetes type 2, controlled ([place]) 02/07/1992    Oretha Caprice, PT 01/21/2019, [place], [place], [address] Phone: [phone]   Fax:  [phone]  Name: [name] MRN: [id number] Date of Birth: 1955-11-21

## 2019-01-25 ENCOUNTER — Ambulatory Visit: Payer: Medicare Other | Admitting: Physical Therapy

## 2019-02-04 ENCOUNTER — Other Ambulatory Visit: Payer: Self-pay

## 2019-02-04 DIAGNOSIS — Q453 Other congenital malformations of pancreas and pancreatic duct: Secondary | ICD-10-CM

## 2019-02-04 MED ORDER — PROMETHAZINE HCL 12.5 MG PO TABS: 12.5000 mg | ORAL_TABLET | Freq: Three times a day (TID) | ORAL | 2 refills | Status: DC | PRN

## 2019-02-04 NOTE — Telephone Encounter (Signed)
promethazine (PHENERGAN) 12.5 MG tablet   Refill request @  [facility] [place], [place] [phone] (Phone) [phone] (Fax)

## 2019-02-04 NOTE — Telephone Encounter (Signed)
Refill approved.

## 2019-02-05 ENCOUNTER — Ambulatory Visit: Payer: Medicare Other | Attending: Family Medicine | Admitting: Physical Therapy

## 2019-02-05 ENCOUNTER — Other Ambulatory Visit: Payer: Self-pay

## 2019-02-05 DIAGNOSIS — M25611 Stiffness of right shoulder, not elsewhere classified: Secondary | ICD-10-CM | POA: Insufficient documentation

## 2019-02-05 DIAGNOSIS — M25511 Pain in right shoulder: Secondary | ICD-10-CM | POA: Insufficient documentation

## 2019-02-05 DIAGNOSIS — M6281 Muscle weakness (generalized): Secondary | ICD-10-CM | POA: Diagnosis present

## 2019-02-05 NOTE — Therapy (Signed)
[place], [place], [address] Phone: [phone]   Fax:  [phone]  Physical Therapy Treatment  Patient Details  Name: [name] MRN: [id number] Date of Birth: [birth year] Referring Provider (PT): Dr. Eunice Blase   Encounter Date: 02/05/2019  PT End of Session - 02/05/19 0810    Visit Number  13    Number of Visits  19    Date for PT Re-Evaluation  03/04/19    Authorization Type  Re-cert sent on 08/24/9561, requested 8 visits starting next week.    PT Start Time  0750    PT Stop Time  0830    PT Time Calculation (min)  40 min    Activity Tolerance  Patient tolerated treatment well    Behavior During Therapy  WFL for tasks assessed/performed       Past Medical History:  Diagnosis Date  . Beta thalassemia ([place])   . Blood dyscrasia    thalasemia, sickle cell trait  . Childhood asthma   . Chronic abdominal pain    Unclear etiology, thought to be due to chronic pancreatitis previously in setting of her pancreatic divisum - however, EUS and EGD performed at [facility]  (10/05/2011) showing normal esophageall, gastric, duodenal mucosa. EUS showing no pancreatic masses, cysts, or changes of chronic pancreatitis. Biliary system nondilated and had no endosonographic abnormalities.  . CVA (cerebral vascular accident) ([place]) 1993   Per report by patient she had a stroke and prolonged rehab course; still "weak on left side but not much" (03/29/2017)  . Family history of adverse reaction to anesthesia    "mom took a long while to wake up; she was allergic to it" (03/29/2017)  . History of blood transfusion 1980s   "related to minor sickle cell crisis" (03/29/2017)  . HLD (hyperlipidemia) 2009  . Hypertension   . Migraine headache    "q couple years now" (03/29/2017)  . Palpitation 07/18/2016  . Pancreatic divisum    S/P ERCP with stenting 07/2010 at [facility], then stent removal (08/05/2010)  .  Pneumonia    "had it in my teens 3 times" (03/29/2017)  . Pulmonary embolus ([place]) 08/2004   Two areas of V/Q mismatch. Findings compatible  with high probability for pulmonary embolus.; pt was on coumadin for 1 year  . Seizures ([place]) 1993   "after the stroke in 1993; none for years now"  (03/29/2017)  . Sickle cell trait ([place])   . Thalassemia   . Type II diabetes mellitus ([place]) 2010   well controlled  . Uterine cancer [facility])     Past Surgical History:  Procedure Laterality Date  . ABDOMINAL HYSTERECTOMY  1980   2/2 endometriosis  . APPENDECTOMY  ?1980   "I think I've had it out" (11/20/2012)  . BREAST LUMPECTOMY Bilateral 1980's   "in my milk ducts; both benign" (02/27/2012)  . CORONARY ANGIOGRAM Bilateral 03/17/2011   Procedure: CORONARY ANGIOGRAM;  Surgeon: Jettie Booze, MD;  Location: [facility] CATH LAB;  Service: Cardiovascular;  Laterality: Bilateral;  . ERCP  07/2010   w/stent placement  at [facility], then stent removal (08/05/2010)  . ESOPHAGOGASTRODUODENOSCOPY N/A 10/26/2013   Procedure: ESOPHAGOGASTRODUODENOSCOPY (EGD);  Surgeon: Jerene Bears, MD;  Location: [facility] ENDOSCOPY;  Service: Endoscopy;  Laterality: N/A;  . Pancreatic stent placement/removal     placed in 2011; removed in 2012/H&P (02/27/2012); "I've had several" (03/29/2017)  . SPHINCTEROTOMY     Archie Endo 11/20/2012    There were no vitals filed  for this visit.  Subjective Assessment - 02/05/19 0807    Subjective  Pt reports her Rt shoulder is making progress, only mild pain today however had a lot of pain yesterday. She does not rate intensity    Pertinent History  diabetes, CVA, sickle cell trait, pancreatits         [facility] Adult PT Treatment/Exercise - 02/05/19 0001      Shoulder Exercises: Supine   Flexion  AAROM;Both;10 reps    Shoulder Flexion Weight (lbs)  3    Other Supine Exercises  chest press with wand and 3 lb cuff wt  X10      Shoulder Exercises: Standing   External Rotation   Strengthening;Right;Theraband;20 reps    Theraband Level (Shoulder External Rotation)  Level 2 (Red)    Internal Rotation  Strengthening;Right;20 reps    Theraband Level (Shoulder Internal Rotation)  Level 2 (Red)    Extension  Strengthening;20 reps    Theraband Level (Shoulder Extension)  Level 2 (Red)    Row  Strengthening;Both;20 reps;Theraband    Theraband Level (Shoulder Row)  Level 2 (Red)   attempted green theraband today, pt reporting increased pain   Other Standing Exercises  wall ladder X 5 ea for flexion and scaption    Other Standing Exercises  UE ranger, flexion, scaption, circles, clockwise and counterclockwise      Shoulder Exercises: ROM/Strengthening   UBE (Upper Arm Bike)  4 min total L2, 2 min fwd, 2 min backward      Shoulder Exercises: Stretch   Internal Rotation Stretch Limitations  towel behind back 10 sec X 10 reps      Iontophoresis   Type of Iontophoresis  Dexamethasone    Location  Right posterior shoulder     Dose  31m    Time  6 hour patch      Manual Therapy   Manual therapy comments  Rt shoulder PROM all planes, and GH mobs all to tolerance               PT Short Term Goals - 01/21/19 0846      PT SHORT TERM GOAL #1   Title  Pt will be I with initial HEP    Baseline  pt reports doing them as much as she can with no questions    Time  4    Period  Weeks    Status  Achieved    Target Date  12/19/18      PT SHORT TERM GOAL #2   Title  Pt will be able to raise arm to shoulder height (flexion and abduction) , 100 deg with pain no more than 4/10 most of the time for ADLs, housework    Baseline  able to flex to 120, pt reporting pain of 6/10    Time  4    Period  Weeks    Status  Partially Met    Target Date  03/04/19      PT SHORT TERM GOAL #3   Title  Pt will be able to use Rt arm to chop, cook and at waist height without increased pain    Baseline  Pt still reporting pain and but states she needs to stop after about 1 minute of  cooking or chopping    Time  4    Period  Weeks    Status  Partially Met    Target Date  03/04/19        PT Long Term Goals -  01/14/19 0843      PT LONG TERM GOAL #1   Baseline  Lt UE flexion 160 , ER 90 and IR 90 deg, R UE: flexion=120, ER= 50    Time  8    Period  Weeks    Status  On-going    Target Date  03/04/19      PT LONG TERM GOAL #2   Title  Pt will be able to sleep without interruption from pain most nights of the week.    Baseline  Pt reports she is still waking up most nights with pain in R shoulder.    Time  8    Period  Weeks    Status  On-going      PT LONG TERM GOAL #3   Title  Pt will be able to ;get dressed, washed and do light home tasks without limitation from pain    Baseline  Still reporting pain but reports her pain has improved since beginning therapy.    Time  8    Period  Weeks    Status  On-going    Target Date  03/04/19      PT LONG TERM GOAL #4   Title  Pt will demo Rt UE strength to 4/5 or more for maximal shoulder function.    Baseline  3+/5 R UE shoulder flexion    Time  8    Period  Weeks    Status  On-going    Target Date  03/04/19      PT LONG TERM GOAL #5   Title  Pt will be I with HEP upon discharge    Time  8    Period  Weeks    Status  On-going      PT LONG TERM GOAL #6   Title  Pt will score FOTO limited no more than 35%    Time  8    Period  Weeks    Status  On-going    Target Date  03/04/19            Plan - 02/05/19 0811    Clinical Impression Statement  Continued to focus on Rt shoulder ROM and strength as tolerated. She is making slow but steady progress in these areas and PT will continue to progress as able.    Personal Factors and Comorbidities  Comorbidity 1;Comorbidity 2    Comorbidities  diabetes, pancreatitis    Examination-Activity Limitations  Lift;Bed Mobility;Transfers;Reach Overhead;Hygiene/Grooming;Dressing    Examination-Participation Restrictions  Laundry;Community Activity;Yard  Work;Cleaning;Meal Prep    Stability/Clinical Decision Making  Evolving/Moderate complexity    Rehab Potential  Excellent    PT Frequency  2x / week    PT Duration  4 weeks    PT Treatment/Interventions  ADLs/Self Care Home Management;Iontophoresis '4mg'$ /ml Dexamethasone;Passive range of motion;Ultrasound;Cryotherapy;Electrical Stimulation;Therapeutic exercise;Therapeutic activities;Patient/family education;Manual techniques;Taping;Moist Heat;Functional mobility training    PT Next Visit Plan  ROM, strength, modalities for pain. Try closed chain (quad, wall), assess ionto    PT Home Exercise Plan  supine cane, table slides, red band ER/IR and horiz abd unattached, sleeper and corner stretch, wall slides flexion and abduction(scaption)    Consulted and Agree with Plan of Care  Patient       Patient will benefit from skilled therapeutic intervention in order to improve the following deficits and impairments:  Pain, Decreased activity tolerance, Decreased range of motion, Decreased strength, Impaired UE functional use, Impaired flexibility, Decreased mobility  Visit Diagnosis: Muscle weakness (generalized)  Stiffness of right shoulder, not elsewhere classified  Acute pain of right shoulder     Problem List Patient Active Problem List   Diagnosis Date Noted  . Bursitis of right shoulder 09/19/2018  . Right calf pain 05/18/2018  . Arthralgia 01/19/2018  . Grief reaction 02/24/2017  . Encounter for screening mammogram for breast cancer 12/02/2016  . Chronic pancreatitis ([place]) 08/31/2016  . Colon cancer screening 05/23/2016  . Opioid dependence ([place]) 04/28/2016  . Sickle cell trait ([place]) 11/16/2011  . Seizure disorder (history after stroke last sz late 1990s) 11/16/2011  . Beta thalassemia ([place]) 11/16/2011  . History of asthma 11/16/2011  . Iron deficiency anemia 11/16/2011  . Chronic abdominal pain   . Preventative health care 07/20/2010  . GERD 08/03/2007  . Congenital anomaly of  pancreas 02/28/2006  . Hyperlipidemia 02/06/2006  . Essential hypertension 02/06/2006  . Diabetes type 2, controlled [facility]) 02/07/1992    Silvestre Mesi 02/05/2019, 8:42 AM  [facility] [address] [place], [place], [address] Phone: [phone]   Fax:  [phone]  Name: Yailen Zemaitis MRN: [id number] Date of Birth: 11/16/1955

## 2019-02-11 IMAGING — MR MR HEAD W/O CM
9 of 11 series · 37 of 48 positions shown · non-contrast
Comparison: CT head 07/15/2011.  MR brain 12/12/2007.

CLINICAL DATA: Acute non-intractable headache, symptoms for 1
month. History of beta thalassemia. History of migraine headache
with seizures. History of diabetes mellitus. History of sickle cell
trait.

EXAM:
MRI HEAD WITHOUT CONTRAST
TECHNIQUE: Multiplanar, multiecho pulse sequences of the brain and surrounding
structures were obtained without intravenous contrast.

[Series 3: T1 · sagittal · 5.0mm · 0.47mm/px · 1 of 24 slices shown]
[im 1/24]
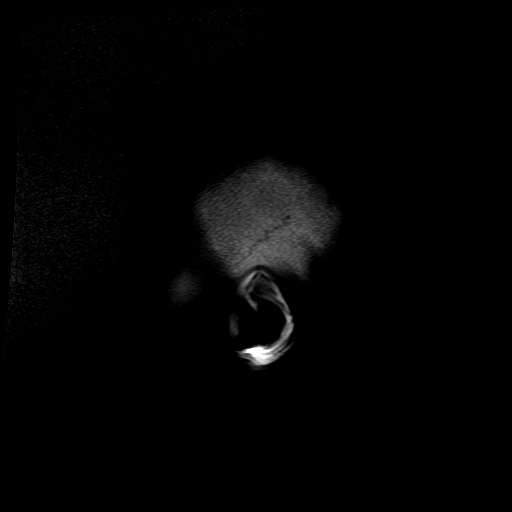

[Series 4: DWI · axial · 3.0mm · 1.09mm/px · z∈[-101,+28]mm · 11 of 104 slices shown (1 of 4)]
[im 1/104]
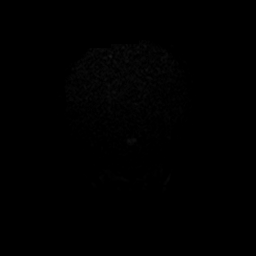
[im 11/104]
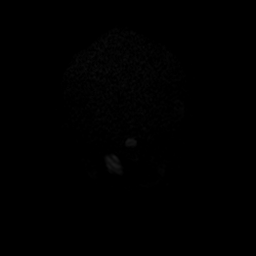
[im 21/104]
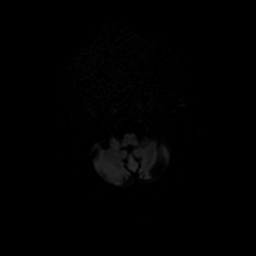
[im 31/104]
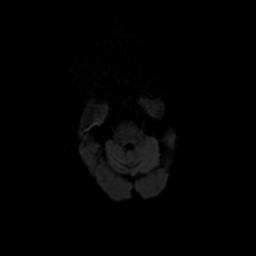
[im 42/104]
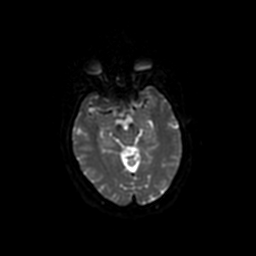
[im 52/104]
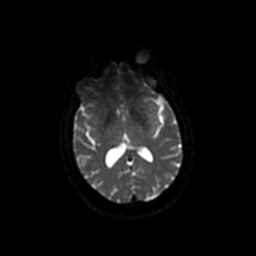
[im 62/104]
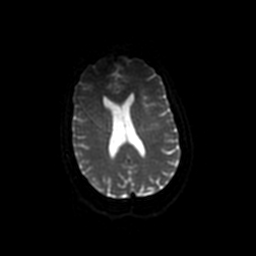
[im 73/104]
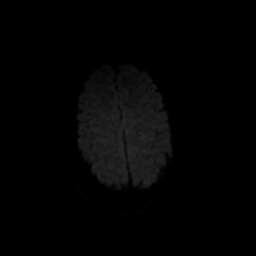
[im 83/104]
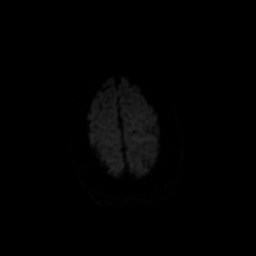
[im 93/104]
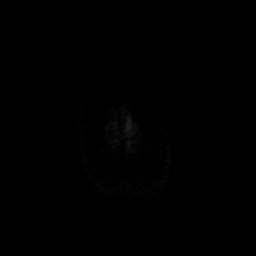
[im 104/104]
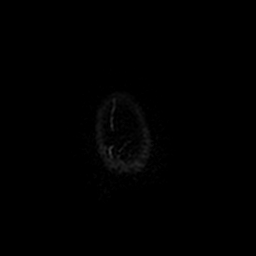

[Series 5: DWI · coronal · 5.0mm · 1.09mm/px · 7 of 66 slices shown (2 of 4)]
[im 1/66]
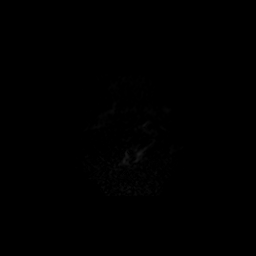
[im 11/66]
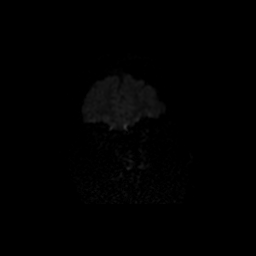
[im 22/66]
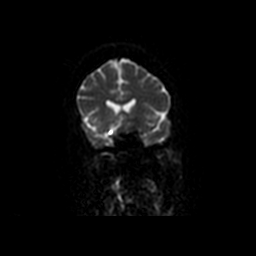
[im 33/66]
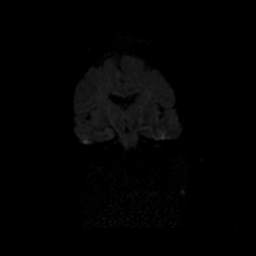
[im 44/66]
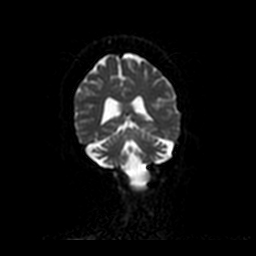
[im 55/66]
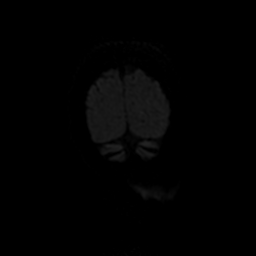
[im 66/66]
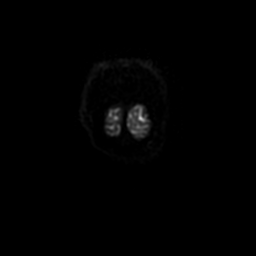

[Series 6: T2 · axial · 5.0mm · 0.43mm/px · z∈[-84,+51]mm · 2 of 23 slices shown (1 of 3)]
[im 1/23]
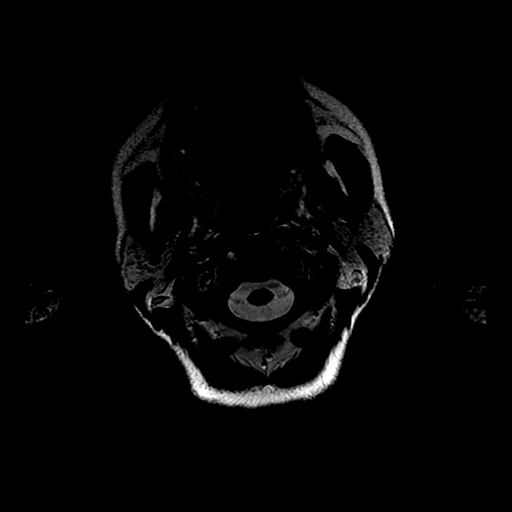
[im 23/23]
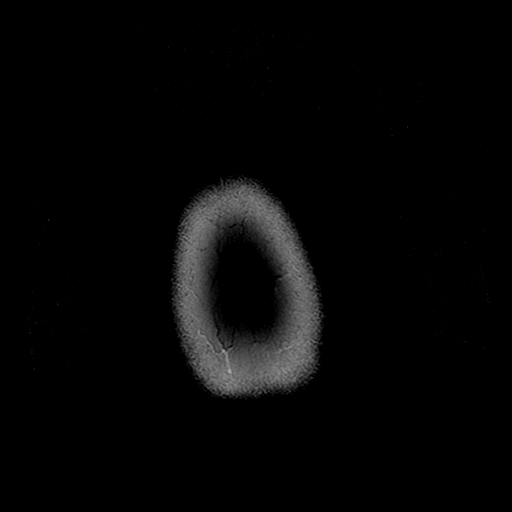

[Series 7: FLAIR · axial · 5.0mm · 0.43mm/px · z∈[-84,+51]mm · 2 of 23 slices shown]
[im 1/23]
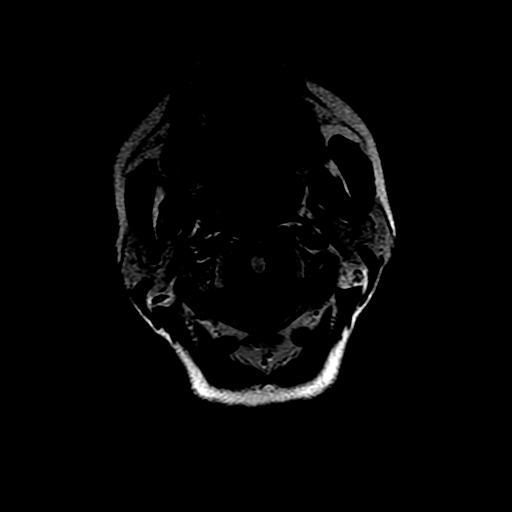
[im 23/23]
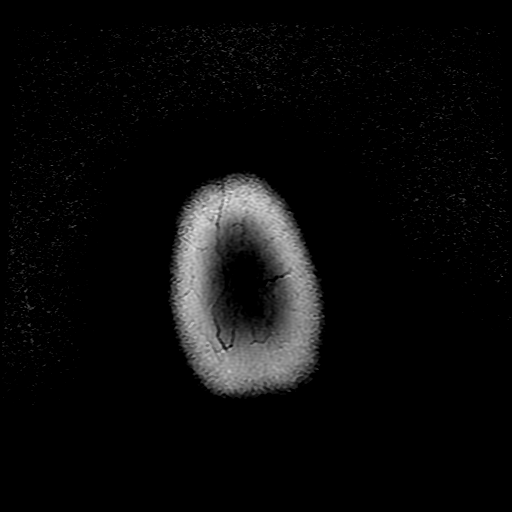

[Series 8: T2 · axial · 5.0mm · 0.43mm/px · z∈[-85,+52]mm · 3 of 27 slices shown (2 of 3)]
[im 1/27]
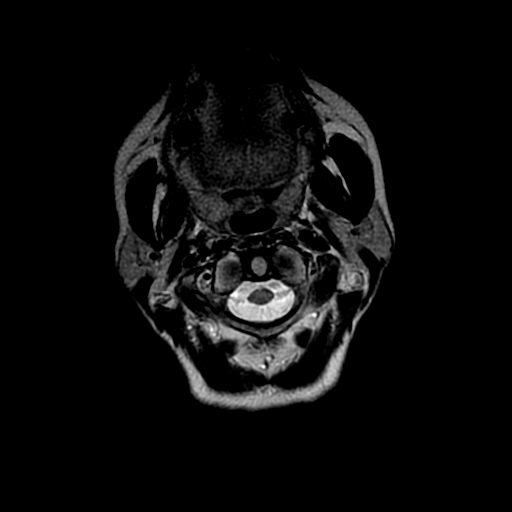
[im 14/27]
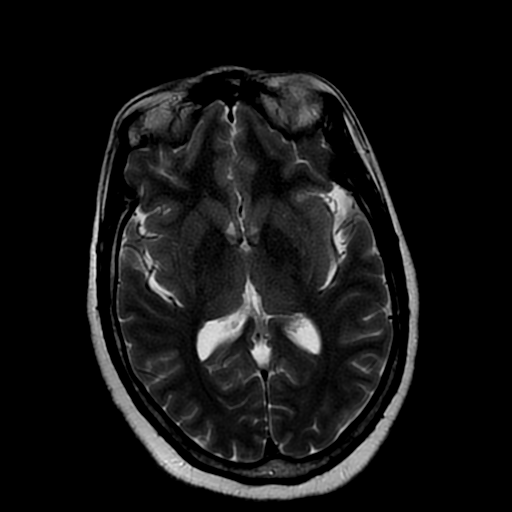
[im 27/27]
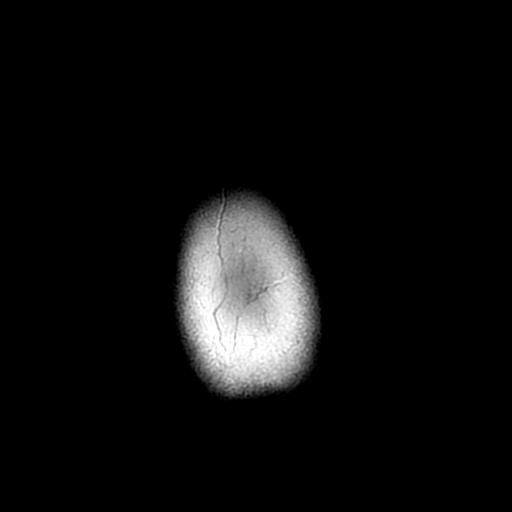

[Series 11: T2 · coronal · 5.0mm · 0.45mm/px · 3 of 25 slices shown (3 of 3)]
[im 1/25]
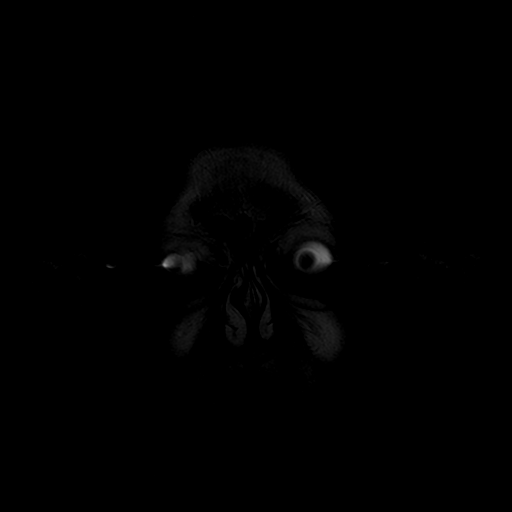
[im 13/25]
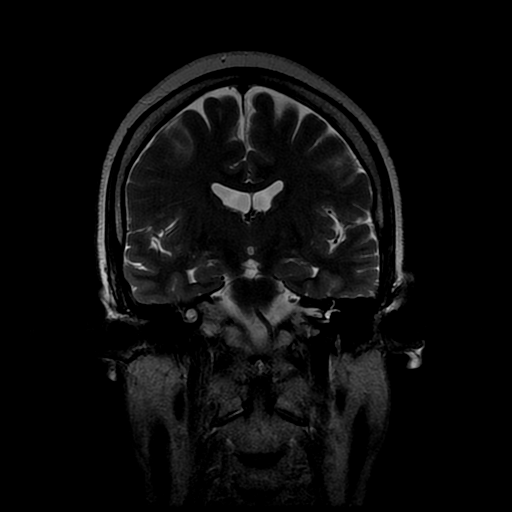
[im 25/25]
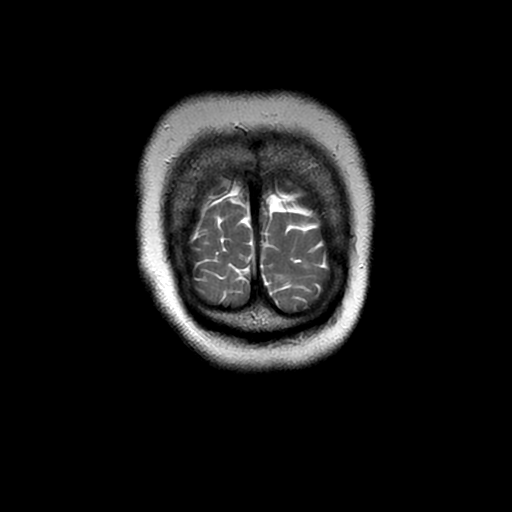

[Series 400: DWI · axial · 3.0mm · 1.09mm/px · z∈[-101,+28]mm · 5 of 52 slices shown (3 of 4)]
[im 1/52]
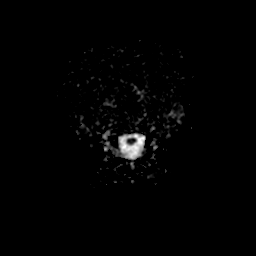
[im 13/52]
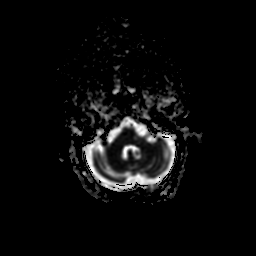
[im 26/52]
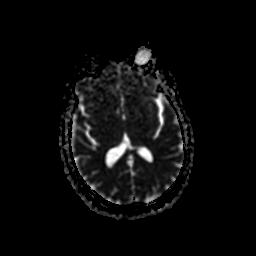
[im 39/52]
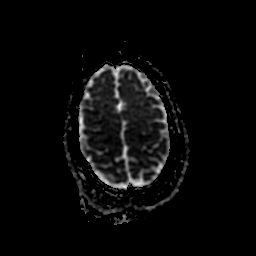
[im 52/52]
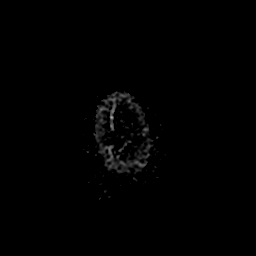

[Series 500: DWI · coronal · 5.0mm · 1.09mm/px · 3 of 33 slices shown (4 of 4)]
[im 1/33]
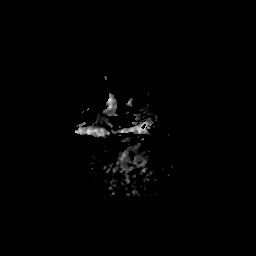
[im 17/33]
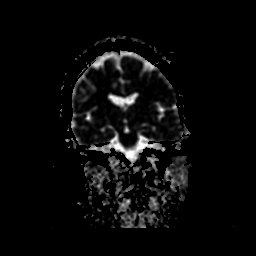
[im 33/33]
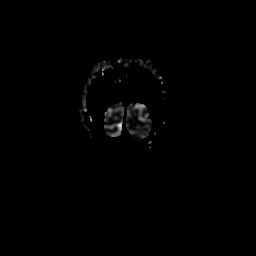

[37 of 48 positions shown; findings below may reference images not displayed]

FINDINGS: Brain: No evidence for acute infarction, hemorrhage, mass lesion,
hydrocephalus, or extra-axial fluid. Advanced cerebellar atrophy.
Normal supratentorial cerebral hemispheric volume. No cervical cord
atrophy. Minor white matter disease, nonspecific may be minimally
progressed from 5992, probably early chronic microvascular ischemic
change appear.

Vascular: Flow voids are maintained throughout the carotid, basilar,
and vertebral arteries. There are no areas of chronic hemorrhage.

Skull and upper cervical spine: Unremarkable visualized calvarium,
skullbase, and cervical vertebrae. Pituitary, pineal, cerebellar
tonsils unremarkable. No upper cervical cord lesions.

Sinuses/Orbits: No acute findings.

Other: None.
IMPRESSION: Atrophy of the cerebellum appears similar to 5992.

No acute intracranial findings are evident.

No cause is seen for the reported symptoms.

## 2019-02-13 ENCOUNTER — Ambulatory Visit: Payer: Medicare Other | Admitting: Physical Therapy

## 2019-02-13 ENCOUNTER — Encounter: Payer: Self-pay | Admitting: Physical Therapy

## 2019-02-13 ENCOUNTER — Other Ambulatory Visit: Payer: Self-pay

## 2019-02-13 DIAGNOSIS — M25511 Pain in right shoulder: Secondary | ICD-10-CM

## 2019-02-13 DIAGNOSIS — M25611 Stiffness of right shoulder, not elsewhere classified: Secondary | ICD-10-CM

## 2019-02-13 DIAGNOSIS — M6281 Muscle weakness (generalized): Secondary | ICD-10-CM

## 2019-02-13 NOTE — Therapy (Signed)
[place], [place], [address] Phone: [phone]   Fax:  [phone]  Physical Therapy Treatment  Patient Details  Name: [name] MRN: [id number] Date of Birth: 1955/12/30 Referring Provider (PT): Dr. Eunice Blase   Encounter Date: 02/13/2019  PT End of Session - 02/13/19 0811    Visit Number  14    Number of Visits  19    Date for PT Re-Evaluation  03/04/19    Authorization Type  Re-cert sent on 2/77/4128, requested 8 visits starting next week.    PT Start Time  0800    PT Stop Time  0845    PT Time Calculation (min)  45 min       Past Medical History:  Diagnosis Date  . Beta thalassemia ([place])   . Blood dyscrasia    thalasemia, sickle cell trait  . Childhood asthma   . Chronic abdominal pain    Unclear etiology, thought to be due to chronic pancreatitis previously in setting of her pancreatic divisum - however, EUS and EGD performed at [facility]  (10/05/2011) showing normal esophageall, gastric, duodenal mucosa. EUS showing no pancreatic masses, cysts, or changes of chronic pancreatitis. Biliary system nondilated and had no endosonographic abnormalities.  . CVA (cerebral vascular accident) ([place]) 1993   Per report by patient she had a stroke and prolonged rehab course; still "weak on left side but not much" (03/29/2017)  . Family history of adverse reaction to anesthesia    "mom took a long while to wake up; she was allergic to it" (03/29/2017)  . History of blood transfusion 1980s   "related to minor sickle cell crisis" (03/29/2017)  . HLD (hyperlipidemia) 2009  . Hypertension   . Migraine headache    "q couple years now" (03/29/2017)  . Palpitation 07/18/2016  . Pancreatic divisum    S/P ERCP with stenting 07/2010 at [facility], then stent removal (08/05/2010)  . Pneumonia    "had it in my teens 3 times" (03/29/2017)  . Pulmonary embolus ([place]) 08/2004   Two areas of V/Q mismatch.  Findings compatible  with high probability for pulmonary embolus.; pt was on coumadin for 1 year  . Seizures ([place]) 1993   "after the stroke in 1993; none for years now"  (03/29/2017)  . Sickle cell trait ([place])   . Thalassemia   . Type II diabetes mellitus ([place]) 2010   well controlled  . Uterine cancer [facility])     Past Surgical History:  Procedure Laterality Date  . ABDOMINAL HYSTERECTOMY  1980   2/2 endometriosis  . APPENDECTOMY  ?1980   "I think I've had it out" (11/20/2012)  . BREAST LUMPECTOMY Bilateral 1980's   "in my milk ducts; both benign" (02/27/2012)  . CORONARY ANGIOGRAM Bilateral 03/17/2011   Procedure: CORONARY ANGIOGRAM;  Surgeon: Jettie Booze, MD;  Location: [facility] CATH LAB;  Service: Cardiovascular;  Laterality: Bilateral;  . ERCP  07/2010   w/stent placement  at [facility], then stent removal (08/05/2010)  . ESOPHAGOGASTRODUODENOSCOPY N/A 10/26/2013   Procedure: ESOPHAGOGASTRODUODENOSCOPY (EGD);  Surgeon: Jerene Bears, MD;  Location: [facility] ENDOSCOPY;  Service: Endoscopy;  Laterality: N/A;  . Pancreatic stent placement/removal     placed in 2011; removed in 2012/H&P (02/27/2012); "I've had several" (03/29/2017)  . SPHINCTEROTOMY     Archie Endo 11/20/2012    There were no vitals filed for this visit.  Subjective Assessment - 02/13/19 0803    Subjective  No shoulder pain right now.  Currently in Pain?  No/denies         [facility] PT Assessment - 02/13/19 0001      Observation/Other Assessments   Focus on Therapeutic Outcomes (FOTO)   46% limited improved from 52% limited       AROM   Right Shoulder Flexion  130 Degrees    Right Shoulder ABduction  117 Degrees    Right Shoulder Internal Rotation  --   reaches to right lower lumbar                  [facility] Adult PT Treatment/Exercise - 02/13/19 0001      Shoulder Exercises: Supine   Other Supine Exercises  chest press with wand and 3 lb cuff wt  X10      Shoulder Exercises: Sidelying   External Rotation   20 reps    External Rotation Weight (lbs)  2    ABduction  20 reps    ABduction Weight (lbs)  1      Shoulder Exercises: Standing   External Rotation  20 reps    Theraband Level (Shoulder External Rotation)  Level 3 (Green)    Internal Rotation  Strengthening;Right;20 reps    Theraband Level (Shoulder Internal Rotation)  Level 3 (Green)    Extension  20 reps    Theraband Level (Shoulder Extension)  Level 3 (Green)    Row  20 reps    Theraband Level (Shoulder Row)  Level 3 (Green)    Other Standing Exercises  pillow case flexion circles each way x 1 min and 30 sec, then wall side slides for flexion, cabinet reaching o#, 1# 2# x 10 each middle shelf.       Shoulder Exercises: ROM/Strengthening   UBE (Upper Arm Bike)  5 min total L2, 3  min backward and forward       Shoulder Exercises: Stretch   Internal Rotation Stretch Limitations  sleeper stretch     Other Shoulder Stretches  ER hands behind head , let elbows relax down.      Manual Therapy   Manual therapy comments  Rt shoulder PROM all planes, and GH mobs all to tolerance               PT Short Term Goals - 02/13/19 0804      PT SHORT TERM GOAL #1   Title  Pt will be I with initial HEP    Baseline  pt reports doing them as much as she can with no questions    Time  4    Period  Weeks    Status  Achieved      PT SHORT TERM GOAL #2   Title  Pt will be able to raise arm to shoulder height (flexion and abduction) , 100 deg with pain no more than 4/10 most of the time for ADLs, housework    Baseline  able to flex to 120 , 117 abdct no pain    Time  4    Period  Weeks    Status  Achieved      PT SHORT TERM GOAL #3   Title  Pt will be able to use Rt arm to chop, cook and at waist height without increased pain    Baseline  much improved, some difficulty with large hard items to chop, improved abiltiy to use caneopener    Time  4    Period  Weeks    Status  Achieved  PT Long Term Goals - 02/13/19 0806       PT LONG TERM GOAL #1   Title  Pt will demonstrate full AROM of Rt UE within 20 deg of Lt UE for full mobility of upper body    Baseline  Lt UE flexion 160 , ER 90 and IR 90 deg, R UE: flexion=120, ER= 50    Period  Weeks    Status  On-going      PT LONG TERM GOAL #2   Title  Pt will be able to sleep without interruption from pain most nights of the week.    Baseline  sleep interrupted 1 out of 7 nights per week. No longer need pillow under arm.    Time  8    Period  Weeks    Status  Achieved      PT LONG TERM GOAL #3   Title  Pt will be able to ;get dressed, washed and do light home tasks without limitation from pain    Baseline  better, modifies things but is able to complete dressing and showering using the wall in shower to assist hoding arm up    Time  8    Period  Weeks    Status  On-going      PT LONG TERM GOAL #4   Title  Pt will demo Rt UE strength to 4/5 or more for maximal shoulder function.    Baseline  3+/5 R UE shoulder flexion    Time  8    Period  Weeks    Status  On-going      PT LONG TERM GOAL #5   Title  Pt will be I with HEP upon discharge    Time  8    Period  Weeks    Status  On-going      PT LONG TERM GOAL #6   Title  Pt will score FOTO limited no more than 35%    Period  Weeks    Status  On-going            Plan - 02/13/19 0847    Clinical Impression Statement  Pt continues to make progress and notes improved tolerance and ability to cook and shower. Her AROM is improving and still limted with ER and IR reaching. She is consitent with HEP.  STG# 2, #3 met.    PT Next Visit Plan  ROM, strength, modalities for pain. Try closed chain (quad, wall), assess ionto    PT Home Exercise Plan  supine cane, table slides, red band ER/IR and horiz abd unattached, sleeper and corner stretch, wall slides flexion and abduction(scaption)       Patient will benefit from skilled therapeutic intervention in order to improve the following deficits and  impairments:  Pain, Decreased activity tolerance, Decreased range of motion, Decreased strength, Impaired UE functional use, Impaired flexibility, Decreased mobility  Visit Diagnosis: Stiffness of right shoulder, not elsewhere classified  Muscle weakness (generalized)  Acute pain of right shoulder     Problem List Patient Active Problem List   Diagnosis Date Noted  . Bursitis of right shoulder 09/19/2018  . Right calf pain 05/18/2018  . Arthralgia 01/19/2018  . Grief reaction 02/24/2017  . Encounter for screening mammogram for breast cancer 12/02/2016  . Chronic pancreatitis ([place]) 08/31/2016  . Colon cancer screening 05/23/2016  . Opioid dependence ([place]) 04/28/2016  . Sickle cell trait ([place]) 11/16/2011  . Seizure disorder (history after stroke last sz late 1990s)  11/16/2011  . Beta thalassemia ([place]) 11/16/2011  . History of asthma 11/16/2011  . Iron deficiency anemia 11/16/2011  . Chronic abdominal pain   . Preventative health care 07/20/2010  . GERD 08/03/2007  . Congenital anomaly of pancreas 02/28/2006  . Hyperlipidemia 02/06/2006  . Essential hypertension 02/06/2006  . Diabetes type 2, controlled ([place]) 02/07/1992    Dorene Ar, PTA 02/13/2019, 8:55 AM  [place], [place], [address] Phone: [phone]   Fax:  [phone]  Name: [name] MRN: [id number] Date of Birth: March 28, 1956

## 2019-02-25 ENCOUNTER — Ambulatory Visit: Payer: Medicare Other

## 2019-02-25 ENCOUNTER — Other Ambulatory Visit: Payer: Self-pay

## 2019-02-25 DIAGNOSIS — M25611 Stiffness of right shoulder, not elsewhere classified: Secondary | ICD-10-CM

## 2019-02-25 DIAGNOSIS — M25511 Pain in right shoulder: Secondary | ICD-10-CM

## 2019-02-25 DIAGNOSIS — M6281 Muscle weakness (generalized): Secondary | ICD-10-CM | POA: Diagnosis not present

## 2019-02-25 NOTE — Therapy (Signed)
[place], [place], [address] Phone: [phone]   Fax:  [phone]  Physical Therapy Treatment  Patient Details  Name: [name] MRN: [id number] Date of Birth: 1956/01/24 Referring Provider (PT): Dr. Eunice Blase   Encounter Date: 02/25/2019  PT End of Session - 02/25/19 0924    Visit Number  15    Number of Visits  23    Date for PT Re-Evaluation  03/29/19    Authorization Type  Re-cert sent on 1/57/2620, requested 8 visits starting next week.    PT Start Time  [phone]   late   PT Stop Time  0957    PT Time Calculation (min)  32 min    Activity Tolerance  Patient tolerated treatment well    Behavior During Therapy  [facility] for tasks assessed/performed       Past Medical History:  Diagnosis Date  . Beta thalassemia ([place])   . Blood dyscrasia    thalasemia, sickle cell trait  . Childhood asthma   . Chronic abdominal pain    Unclear etiology, thought to be due to chronic pancreatitis previously in setting of her pancreatic divisum - however, EUS and EGD performed at [facility]  (10/05/2011) showing normal esophageall, gastric, duodenal mucosa. EUS showing no pancreatic masses, cysts, or changes of chronic pancreatitis. Biliary system nondilated and had no endosonographic abnormalities.  . CVA (cerebral vascular accident) ([place]) 1993   Per report by patient she had a stroke and prolonged rehab course; still "weak on left side but not much" (03/29/2017)  . Family history of adverse reaction to anesthesia    "mom took a long while to wake up; she was allergic to it" (03/29/2017)  . History of blood transfusion 1980s   "related to minor sickle cell crisis" (03/29/2017)  . HLD (hyperlipidemia) 2009  . Hypertension   . Migraine headache    "q couple years now" (03/29/2017)  . Palpitation 07/18/2016  . Pancreatic divisum    S/P ERCP with stenting 07/2010 at [facility], then stent removal (08/05/2010)   . Pneumonia    "had it in my teens 3 times" (03/29/2017)  . Pulmonary embolus ([place]) 08/2004   Two areas of V/Q mismatch. Findings compatible  with high probability for pulmonary embolus.; pt was on coumadin for 1 year  . Seizures ([place]) 1993   "after the stroke in 1993; none for years now"  (03/29/2017)  . Sickle cell trait ([place])   . Thalassemia   . Type II diabetes mellitus ([place]) 2010   well controlled  . Uterine cancer [facility])     Past Surgical History:  Procedure Laterality Date  . ABDOMINAL HYSTERECTOMY  1980   2/2 endometriosis  . APPENDECTOMY  ?1980   "I think I've had it out" (11/20/2012)  . BREAST LUMPECTOMY Bilateral 1980's   "in my milk ducts; both benign" (02/27/2012)  . CORONARY ANGIOGRAM Bilateral 03/17/2011   Procedure: CORONARY ANGIOGRAM;  Surgeon: Jettie Booze, MD;  Location: [facility] CATH LAB;  Service: Cardiovascular;  Laterality: Bilateral;  . ERCP  07/2010   w/stent placement  at [facility], then stent removal (08/05/2010)  . ESOPHAGOGASTRODUODENOSCOPY N/A 10/26/2013   Procedure: ESOPHAGOGASTRODUODENOSCOPY (EGD);  Surgeon: Jerene Bears, MD;  Location: [facility] ENDOSCOPY;  Service: Endoscopy;  Laterality: N/A;  . Pancreatic stent placement/removal     placed in 2011; removed in 2012/H&P (02/27/2012); "I've had several" (03/29/2017)  . SPHINCTEROTOMY     Archie Endo 11/20/2012    There were no  vitals filed for this visit.  Subjective Assessment - 02/25/19 0928    Subjective  mild soreness . Mostly stiffness.   No changes since last session.    She has not seen Dr Morene Crocker since 10/2018.  She feel she is better since starting PT.     She can move RT arm better but can't move to reach behind the back.  She can't wash hair comfortabley.   Reaching into cabinets difficult.    Pertinent History  diabetes, CVA, sickle cell trait, pancreatits    Currently in Pain?  No/denies                       [facility] Adult PT Treatment/Exercise - 02/25/19 0001      Shoulder  Exercises: Supine   Other Supine Exercises  chest press with 3# x 15      Shoulder Exercises: Sidelying   External Rotation  15 reps    External Rotation Weight (lbs)  3    ABduction  --    ABduction Weight (lbs)  2    ABduction Limitations  12 reps      Shoulder Exercises: Standing   External Rotation  20 reps    Theraband Level (Shoulder External Rotation)  Level 3 (Green)    Internal Rotation  Strengthening;Right;20 reps    Theraband Level (Shoulder Internal Rotation)  Level 3 (Green)      Shoulder Exercises: ROM/Strengthening   UBE (Upper Arm Bike)  5 min total L2,  forward       Manual Therapy   Manual therapy comments  Rt shoulder PROM all planes, and GH mobs all to tolerance               PT Short Term Goals - 02/13/19 0804      PT SHORT TERM GOAL #1   Title  Pt will be I with initial HEP    Baseline  pt reports doing them as much as she can with no questions    Time  4    Period  Weeks    Status  Achieved      PT SHORT TERM GOAL #2   Title  Pt will be able to raise arm to shoulder height (flexion and abduction) , 100 deg with pain no more than 4/10 most of the time for ADLs, housework    Baseline  able to flex to 120 , 117 abdct no pain    Time  4    Period  Weeks    Status  Achieved      PT SHORT TERM GOAL #3   Title  Pt will be able to use Rt arm to chop, cook and at waist height without increased pain    Baseline  much improved, some difficulty with large hard items to chop, improved abiltiy to use caneopener    Time  4    Period  Weeks    Status  Achieved        PT Long Term Goals - 02/25/19 0930      PT LONG TERM GOAL #1   Title  Pt will demonstrate full AROM of Rt UE within 20 deg of Lt UE for full mobility of upper body    Baseline  RT shoulder flexion 95     abduction  101            ER   20 at 80 degrees abduction  IR   26 degrees   shoulder at 80 degrees    Status  On-going      PT LONG TERM GOAL #2   Title  Pt will be  able to sleep without interruption from pain most nights of the week.    Baseline  sleep interrupted 1 out of 7 nights per week. No longer need pillow under arm.    Status  On-going      PT LONG TERM GOAL #3   Title  Pt will be able to ;get dressed, washed and do light home tasks without limitation from pain    Baseline  better, modifies things but is able to complete dressing and showering using the wall in shower to assist hoding arm up    Status  Partially Met      PT LONG TERM GOAL #4   Title  Pt will demo Rt UE strength to 4/5 or more for maximal shoulder function.    Baseline  3+/5 R UE shoulder flexion    Status  On-going      PT LONG TERM GOAL #5   Title  Pt will be I with HEP upon discharge    Status  On-going      PT LONG TERM GOAL #6   Title  Pt will score FOTO limited no more than 35%    Status  On-going            Plan - 02/25/19 0937    Clinical Impression Statement  [name] is better since starting PT but is still very limited with ROM active and passive.   She may benefit from MUA to progress ROM more quicky.    Will continue for another month as if no better discharge.  I asked her to contact Dr hilts for follow up session as she has not seen him since 11/14/18    Examination-Participation Restrictions  Laundry;Community Activity;Yard Work;Cleaning;Meal Prep    PT Treatment/Interventions  ADLs/Self Care Home Management;Iontophoresis '4mg'$ /ml Dexamethasone;Passive range of motion;Ultrasound;Cryotherapy;Electrical Stimulation;Therapeutic exercise;Therapeutic activities;Patient/family education;Manual techniques;Taping;Moist Heat;Functional mobility training    PT Next Visit Plan  ROM, strength, modalities for pain. Try closed chain (quad, wall), assess ionto    PT Home Exercise Plan  supine cane, table slides, red band ER/IR and horiz abd unattached, sleeper and corner stretch, wall slides flexion and abduction(scaption)    Consulted and Agree with Plan of  Care  Patient       Patient will benefit from skilled therapeutic intervention in order to improve the following deficits and impairments:  Pain, Decreased activity tolerance, Decreased range of motion, Decreased strength, Impaired UE functional use, Impaired flexibility, Decreased mobility  Visit Diagnosis: Muscle weakness (generalized)  Stiffness of right shoulder, not elsewhere classified  Acute pain of right shoulder     Problem List Patient Active Problem List   Diagnosis Date Noted  . Bursitis of right shoulder 09/19/2018  . Right calf pain 05/18/2018  . Arthralgia 01/19/2018  . Grief reaction 02/24/2017  . Encounter for screening mammogram for breast cancer 12/02/2016  . Chronic pancreatitis ([place]) 08/31/2016  . Colon cancer screening 05/23/2016  . Opioid dependence ([place]) 04/28/2016  . Sickle cell trait ([place]) 11/16/2011  . Seizure disorder (history after stroke last sz late 1990s) 11/16/2011  . Beta thalassemia ([place]) 11/16/2011  . History of asthma 11/16/2011  . Iron deficiency anemia 11/16/2011  . Chronic abdominal pain   . Preventative health care 07/20/2010  . GERD 08/03/2007  . Congenital anomaly of  pancreas 02/28/2006  . Hyperlipidemia 02/06/2006  . Essential hypertension 02/06/2006  . Diabetes type 2, controlled ([place]) 02/07/1992    Darrel Hoover   PT 02/25/2019, 10:04 AM  [facility] [address] [place], [place], [address] Phone: [phone]   Fax:  [phone]  Name: [name] MRN: [id number] Date of Birth: [birth year]

## 2019-02-27 ENCOUNTER — Other Ambulatory Visit: Payer: Self-pay | Admitting: Internal Medicine

## 2019-02-27 NOTE — Telephone Encounter (Signed)
Refill approved.

## 2019-03-04 ENCOUNTER — Other Ambulatory Visit: Payer: Self-pay

## 2019-03-04 ENCOUNTER — Encounter: Payer: Self-pay | Admitting: Physical Therapy

## 2019-03-04 ENCOUNTER — Ambulatory Visit: Payer: Medicare Other | Attending: Family Medicine | Admitting: Physical Therapy

## 2019-03-04 DIAGNOSIS — M6281 Muscle weakness (generalized): Secondary | ICD-10-CM

## 2019-03-04 DIAGNOSIS — M25511 Pain in right shoulder: Secondary | ICD-10-CM | POA: Insufficient documentation

## 2019-03-04 DIAGNOSIS — M25611 Stiffness of right shoulder, not elsewhere classified: Secondary | ICD-10-CM | POA: Diagnosis present

## 2019-03-04 NOTE — Therapy (Deleted)
[place], [place], [address] Phone: [phone]   Fax:  [phone]  Patient Details  Name: [name] MRN: [id number] Date of Birth: July 21, 1955 Referring Provider:  Eunice Blase, MD  Encounter Date: 03/04/2019   Oretha Caprice, PT 03/04/2019, 9:08 AM  [facility] [address] [place], [place], [address] Phone: [phone]   Fax:  [phone]

## 2019-03-04 NOTE — Therapy (Signed)
[place], [place], [address] Phone: [phone]   Fax:  [phone]  Physical Therapy Treatment  Patient Details  Name: [name] MRN: [id number] Date of Birth: 1955/09/09 Referring Provider (PT): Dr. Eunice Blase   Encounter Date: 03/04/2019  PT End of Session - 03/04/19 0835    Visit Number  16    Number of Visits  23    Date for PT Re-Evaluation  03/29/19    Authorization Type  Re-cert sent on 3/78/5885, requested 8 visits starting next week.    PT Start Time  0830    PT Stop Time  0915    PT Time Calculation (min)  45 min    Activity Tolerance  Patient tolerated treatment well    Behavior During Therapy  [facility] for tasks assessed/performed       Past Medical History:  Diagnosis Date  . Beta thalassemia ([place])   . Blood dyscrasia    thalasemia, sickle cell trait  . Childhood asthma   . Chronic abdominal pain    Unclear etiology, thought to be due to chronic pancreatitis previously in setting of her pancreatic divisum - however, EUS and EGD performed at [place]  (10/05/2011) showing normal esophageall, gastric, duodenal mucosa. EUS showing no pancreatic masses, cysts, or changes of chronic pancreatitis. Biliary system nondilated and had no endosonographic abnormalities.  . CVA (cerebral vascular accident) ([place]) 1993   Per report by patient she had a stroke and prolonged rehab course; still "weak on left side but not much" (03/29/2017)  . Family history of adverse reaction to anesthesia    "mom took a long while to wake up; she was allergic to it" (03/29/2017)  . History of blood transfusion 1980s   "related to minor sickle cell crisis" (03/29/2017)  . HLD (hyperlipidemia) 2009  . Hypertension   . Migraine headache    "q couple years now" (03/29/2017)  . Palpitation 07/18/2016  . Pancreatic divisum    S/P ERCP with stenting 07/2010 at [facility], then stent removal (08/05/2010)  .  Pneumonia    "had it in my teens 3 times" (03/29/2017)  . Pulmonary embolus ([place]) 08/2004   Two areas of V/Q mismatch. Findings compatible  with high probability for pulmonary embolus.; pt was on coumadin for 1 year  . Seizures ([place]) 1993   "after the stroke in 1993; none for years now"  (03/29/2017)  . Sickle cell trait ([place])   . Thalassemia   . Type II diabetes mellitus ([place]) 2010   well controlled  . Uterine cancer [facility])     Past Surgical History:  Procedure Laterality Date  . ABDOMINAL HYSTERECTOMY  1980   2/2 endometriosis  . APPENDECTOMY  ?1980   "I think I've had it out" (11/20/2012)  . BREAST LUMPECTOMY Bilateral 1980's   "in my milk ducts; both benign" (02/27/2012)  . CORONARY ANGIOGRAM Bilateral 03/17/2011   Procedure: CORONARY ANGIOGRAM;  Surgeon: Jettie Booze, MD;  Location: [facility] CATH LAB;  Service: Cardiovascular;  Laterality: Bilateral;  . ERCP  07/2010   w/stent placement  at [facility], then stent removal (08/05/2010)  . ESOPHAGOGASTRODUODENOSCOPY N/A 10/26/2013   Procedure: ESOPHAGOGASTRODUODENOSCOPY (EGD);  Surgeon: Jerene Bears, MD;  Location: [facility] ENDOSCOPY;  Service: Endoscopy;  Laterality: N/A;  . Pancreatic stent placement/removal     placed in 2011; removed in 2012/H&P (02/27/2012); "I've had several" (03/29/2017)  . SPHINCTEROTOMY     Archie Endo 11/20/2012    There were no vitals filed  for this visit.  Subjective Assessment - 03/04/19 0833    Subjective  Pt reporting only pain when she moves certain ways. No pain at rest. Pt reporting that she feels better since starting therpay.    Pertinent History  diabetes, CVA, sickle cell trait, pancreatits    Limitations  Lifting;Writing;House hold activities    Patient Stated Goals  Patient would like to be able to use her arm , less pain, sleep better    Currently in Pain?  No/denies                       [facility] Adult PT Treatment/Exercise - 03/04/19 0001      Shoulder Exercises: Supine    External Rotation  AAROM;Right;15 reps      Shoulder Exercises: Sidelying   External Rotation  15 reps    External Rotation Weight (lbs)  3      Shoulder Exercises: Standing   External Rotation  20 reps    Theraband Level (Shoulder External Rotation)  Level 3 (Green)    Internal Rotation  Strengthening;Right;20 reps    Theraband Level (Shoulder Internal Rotation)  Level 3 (Green)    Other Standing Exercises  towel slides bil UE's x 20, circles x 15 each direction with R UE    Other Standing Exercises  reaching up on 1st and 2nd shelf and lifting 2# and 3# weight up and down x 10 on each shelf.       Shoulder Exercises: ROM/Strengthening   UBE (Upper Arm Bike)  6 min total L2,  forward/back       Shoulder Exercises: Stretch   Other Shoulder Stretches  IR with towel behind her back holding 10 seconds x 10 reps      Shoulder Exercises: Body Blade   Other Body Blade Exercises  x 1 minute horizontal and vertical R UE. difficulty wiht coordination      Manual Therapy   Manual therapy comments  PROM: concentration on ER, GH glides             PT Education - 03/04/19 0835    Education Details  Continue HEP, concentration on ER (door stretches), and IR ( towel stretches)    Person(s) Educated  Patient    Methods  Explanation    Comprehension  Verbalized understanding       PT Short Term Goals - 03/04/19 0846      PT SHORT TERM GOAL #1   Title  Pt will be I with initial HEP    Baseline  pt reports doing them as much as she can with no questions    Time  4    Period  Weeks    Status  Achieved    Target Date  12/19/18      PT SHORT TERM GOAL #2   Title  Pt will be able to raise arm to shoulder height (flexion and abduction) , 100 deg with pain no more than 4/10 most of the time for ADLs, housework    Baseline  able to flex to 120 , 117 abdct no pain    Time  4    Period  Weeks    Status  Achieved    Target Date  03/04/19      PT SHORT TERM GOAL #3   Title  Pt will be  able to use Rt arm to chop, cook and at waist height without increased pain    Baseline  much improved,  some difficulty with large hard items to chop, improved abiltiy to use caneopener    Time  4    Period  Weeks    Status  Achieved    Target Date  03/04/19        PT Long Term Goals - 03/04/19 0846      PT LONG TERM GOAL #1   Title  Pt will demonstrate full AROM of Rt UE within 20 deg of Lt UE for full mobility of upper body    Baseline  RT shoulder flexion 95     abduction  101            ER   20 at 80 degrees abduction             IR   26 degrees   shoulder at 80 degrees    Time  8    Period  Weeks    Status  On-going      PT LONG TERM GOAL #2   Title  Pt will be able to sleep without interruption from pain most nights of the week.    Baseline  sleep interrupted 1 out of 7 nights per week. No longer need pillow under arm.    Time  8    Period  Weeks    Status  On-going      PT LONG TERM GOAL #3   Title  Pt will be able to ;get dressed, washed and do light home tasks without limitation from pain    Baseline  better, modifies things but is able to complete dressing and showering using the wall in shower to assist hoding arm up    Period  Weeks    Status  Partially Met      PT LONG TERM GOAL #4   Title  Pt will demo Rt UE strength to 4/5 or more for maximal shoulder function.    Baseline  3+/5 R UE shoulder flexion    Period  Weeks    Status  On-going      PT LONG TERM GOAL #5   Title  Pt will be I with HEP upon discharge    Time  8    Period  Weeks    Status  On-going      PT LONG TERM GOAL #6   Title  Pt will score FOTO limited no more than 35%    Time  8    Period  Weeks    Status  On-going            Plan - 03/04/19 9163    Clinical Impression Statement  Pt arriving to therapy reporting pain with certain movements. Pt still having trouble with reaching behind her back and reaching into cabinets. Pt reporting pain with certain exercises was 4-5/10. Pt  tolerating well. Pt reporting she is contacting Dr. Junius Roads today to try to get an appointment. Continue PT as pt progresses toward her goals.    Rehab Potential  Excellent    PT Duration  4 weeks    PT Treatment/Interventions  ADLs/Self Care Home Management;Iontophoresis 54m/ml Dexamethasone;Passive range of motion;Ultrasound;Cryotherapy;Electrical Stimulation;Therapeutic exercise;Therapeutic activities;Patient/family education;Manual techniques;Taping;Moist Heat;Functional mobility training    PT Next Visit Plan  ROM, strength, modalities for pain. Try closed chain (quad, wall), assess ionto    PT Home Exercise Plan  supine cane, table slides, red band ER/IR and horiz abd unattached, sleeper and corner stretch, wall slides flexion and abduction(scaption), wall slides    Consulted  and Agree with Plan of Care  Patient       Patient will benefit from skilled therapeutic intervention in order to improve the following deficits and impairments:     Visit Diagnosis: Muscle weakness (generalized)  Stiffness of right shoulder, not elsewhere classified  Acute pain of right shoulder     Problem List Patient Active Problem List   Diagnosis Date Noted  . Bursitis of right shoulder 09/19/2018  . Right calf pain 05/18/2018  . Arthralgia 01/19/2018  . Grief reaction 02/24/2017  . Encounter for screening mammogram for breast cancer 12/02/2016  . Chronic pancreatitis ([place]) 08/31/2016  . Colon cancer screening 05/23/2016  . Opioid dependence ([place]) 04/28/2016  . Sickle cell trait ([place]) 11/16/2011  . Seizure disorder (history after stroke last sz late 1990s) 11/16/2011  . Beta thalassemia ([place]) 11/16/2011  . History of asthma 11/16/2011  . Iron deficiency anemia 11/16/2011  . Chronic abdominal pain   . Preventative health care 07/20/2010  . GERD 08/03/2007  . Congenital anomaly of pancreas 02/28/2006  . Hyperlipidemia 02/06/2006  . Essential hypertension 02/06/2006  . Diabetes type 2,  controlled ([place]) 02/07/1992    Oretha Caprice, PT 03/04/2019, 9:11 AM  [facility] [address] [place], [place], [address] Phone: [phone]   Fax:  [phone]  Name: Burma Ketcher MRN: [id number] Date of Birth: [birth year]

## 2019-03-08 ENCOUNTER — Ambulatory Visit: Payer: Medicare Other | Admitting: Physical Therapy

## 2019-03-11 ENCOUNTER — Encounter: Payer: Self-pay | Admitting: Physical Therapy

## 2019-03-11 ENCOUNTER — Other Ambulatory Visit: Payer: Self-pay

## 2019-03-11 ENCOUNTER — Ambulatory Visit: Payer: Medicare Other | Admitting: Physical Therapy

## 2019-03-11 DIAGNOSIS — M25611 Stiffness of right shoulder, not elsewhere classified: Secondary | ICD-10-CM

## 2019-03-11 DIAGNOSIS — M25511 Pain in right shoulder: Secondary | ICD-10-CM

## 2019-03-11 DIAGNOSIS — M6281 Muscle weakness (generalized): Secondary | ICD-10-CM

## 2019-03-11 NOTE — Therapy (Signed)
[place], [place], [address] Phone: [phone]   Fax:  [phone]  Physical Therapy Treatment  Patient Details  Name: [name] MRN: [id number] Date of Birth: 07/28/1955 Referring Provider (PT): Dr. Eunice Blase   Encounter Date: 03/11/2019  PT End of Session - 03/11/19 0934    Visit Number  17    Number of Visits  23    Date for PT Re-Evaluation  03/29/19    Authorization Type  N/A    PT Start Time  0932    PT Stop Time  0355    PT Time Calculation (min)  42 min       Past Medical History:  Diagnosis Date  . Beta thalassemia ([place])   . Blood dyscrasia    thalasemia, sickle cell trait  . Childhood asthma   . Chronic abdominal pain    Unclear etiology, thought to be due to chronic pancreatitis previously in setting of her pancreatic divisum - however, EUS and EGD performed at [place]  (10/05/2011) showing normal esophageall, gastric, duodenal mucosa. EUS showing no pancreatic masses, cysts, or changes of chronic pancreatitis. Biliary system nondilated and had no endosonographic abnormalities.  . CVA (cerebral vascular accident) ([place]) 1993   Per report by patient she had a stroke and prolonged rehab course; still "weak on left side but not much" (03/29/2017)  . Family history of adverse reaction to anesthesia    "mom took a long while to wake up; she was allergic to it" (03/29/2017)  . History of blood transfusion 1980s   "related to minor sickle cell crisis" (03/29/2017)  . HLD (hyperlipidemia) 2009  . Hypertension   . Migraine headache    "q couple years now" (03/29/2017)  . Palpitation 07/18/2016  . Pancreatic divisum    S/P ERCP with stenting 07/2010 at [facility], then stent removal (08/05/2010)  . Pneumonia    "had it in my teens 3 times" (03/29/2017)  . Pulmonary embolus ([place]) 08/2004   Two areas of V/Q mismatch. Findings compatible  with high probability for pulmonary  embolus.; pt was on coumadin for 1 year  . Seizures ([place]) 1993   "after the stroke in 1993; none for years now"  (03/29/2017)  . Sickle cell trait ([place])   . Thalassemia   . Type II diabetes mellitus ([place]) 2010   well controlled  . Uterine cancer [facility])     Past Surgical History:  Procedure Laterality Date  . ABDOMINAL HYSTERECTOMY  1980   2/2 endometriosis  . APPENDECTOMY  ?1980   "I think I've had it out" (11/20/2012)  . BREAST LUMPECTOMY Bilateral 1980's   "in my milk ducts; both benign" (02/27/2012)  . CORONARY ANGIOGRAM Bilateral 03/17/2011   Procedure: CORONARY ANGIOGRAM;  Surgeon: Jettie Booze, MD;  Location: [facility] CATH LAB;  Service: Cardiovascular;  Laterality: Bilateral;  . ERCP  07/2010   w/stent placement  at [facility], then stent removal (08/05/2010)  . ESOPHAGOGASTRODUODENOSCOPY N/A 10/26/2013   Procedure: ESOPHAGOGASTRODUODENOSCOPY (EGD);  Surgeon: Jerene Bears, MD;  Location: [facility] ENDOSCOPY;  Service: Endoscopy;  Laterality: N/A;  . Pancreatic stent placement/removal     placed in 2011; removed in 2012/H&P (02/27/2012); "I've had several" (03/29/2017)  . SPHINCTEROTOMY     Archie Endo 11/20/2012    There were no vitals filed for this visit.  Subjective Assessment - 03/11/19 0933    Subjective  Hurting a little. I see the doctor tomorrow.    Currently in Pain?  Yes  Pain Score  4     Pain Location  Shoulder    Pain Orientation  Right    Pain Descriptors / Indicators  Aching    Aggravating Factors   moving it the wrong way: reaching back                       [facility] Adult PT Treatment/Exercise - 03/11/19 0001      Shoulder Exercises: Sidelying   External Rotation  20 reps    External Rotation Weight (lbs)  3    ABduction  20 reps    ABduction Weight (lbs)  2      Shoulder Exercises: Standing   Horizontal ABduction  20 reps    Theraband Level (Shoulder Horizontal ABduction)  Level 2 (Red)    External Rotation  20 reps    Theraband Level  (Shoulder External Rotation)  Level 3 (Green)    Internal Rotation  Strengthening;Right;20 reps    Theraband Level (Shoulder Internal Rotation)  Level 3 (Green)    Extension  20 reps    Theraband Level (Shoulder Extension)  Level 3 (Green)    Row  20 reps    Theraband Level (Shoulder Row)  Level 3 (Green)    Diagonals  10 reps    Theraband Level (Shoulder Diagonals)  Level 2 (Red)    Other Standing Exercises  towel slides bil UE's x 20, circles x 15 each direction with R UE    Other Standing Exercises  2# cabinet reaching middle shelf , 3# over head press using bilat UE  , 2# lateral raise and forward raise       Shoulder Exercises: ROM/Strengthening   UBE (Upper Arm Bike)  6 min total L2,  forward/back     Other ROM/Strengthening Exercises  bent over row, one arm on counter, 3#, then extension, the tricep kick backs                PT Short Term Goals - 03/04/19 0846      PT SHORT TERM GOAL #1   Title  Pt will be I with initial HEP    Baseline  pt reports doing them as much as she can with no questions    Time  4    Period  Weeks    Status  Achieved    Target Date  12/19/18      PT SHORT TERM GOAL #2   Title  Pt will be able to raise arm to shoulder height (flexion and abduction) , 100 deg with pain no more than 4/10 most of the time for ADLs, housework    Baseline  able to flex to 120 , 117 abdct no pain    Time  4    Period  Weeks    Status  Achieved    Target Date  03/04/19      PT SHORT TERM GOAL #3   Title  Pt will be able to use Rt arm to chop, cook and at waist height without increased pain    Baseline  much improved, some difficulty with large hard items to chop, improved abiltiy to use caneopener    Time  4    Period  Weeks    Status  Achieved    Target Date  03/04/19        PT Long Term Goals - 03/04/19 0846      PT LONG TERM GOAL #1   Title  Pt will  demonstrate full AROM of Rt UE within 20 deg of Lt UE for full mobility of upper body    Baseline   RT shoulder flexion 95     abduction  101            ER   20 at 80 degrees abduction             IR   26 degrees   shoulder at 80 degrees    Time  8    Period  Weeks    Status  On-going      PT LONG TERM GOAL #2   Title  Pt will be able to sleep without interruption from pain most nights of the week.    Baseline  sleep interrupted 1 out of 7 nights per week. No longer need pillow under arm.    Time  8    Period  Weeks    Status  On-going      PT LONG TERM GOAL #3   Title  Pt will be able to ;get dressed, washed and do light home tasks without limitation from pain    Baseline  better, modifies things but is able to complete dressing and showering using the wall in shower to assist hoding arm up    Period  Weeks    Status  Partially Met      PT LONG TERM GOAL #4   Title  Pt will demo Rt UE strength to 4/5 or more for maximal shoulder function.    Baseline  3+/5 R UE shoulder flexion    Period  Weeks    Status  On-going      PT LONG TERM GOAL #5   Title  Pt will be I with HEP upon discharge    Time  8    Period  Weeks    Status  On-going      PT LONG TERM GOAL #6   Title  Pt will score FOTO limited no more than 35%    Time  8    Period  Weeks    Status  On-going            Plan - 03/11/19 1013    Clinical Impression Statement  Pt reports improvement in overhead activites however has pain with lateral and behind the back motions. She reports pain with sudden movements. Continued with endurance and strengthening of right shoulder. She tolerated session well and declined modalities. SHe will see MD for follow up tomorrow.    PT Next Visit Plan  ROM, strength, modalities for pain. Try closed chain (quad, wall), assess ionto    PT Home Exercise Plan  supine cane, table slides, red band ER/IR and horiz abd unattached, sleeper and corner stretch, wall slides flexion and abduction(scaption), wall slides       Patient will benefit from skilled therapeutic intervention in order  to improve the following deficits and impairments:  Pain, Decreased activity tolerance, Decreased range of motion, Decreased strength, Impaired UE functional use, Impaired flexibility, Decreased mobility  Visit Diagnosis: Muscle weakness (generalized)  Stiffness of right shoulder, not elsewhere classified  Acute pain of right shoulder     Problem List Patient Active Problem List   Diagnosis Date Noted  . Bursitis of right shoulder 09/19/2018  . Right calf pain 05/18/2018  . Arthralgia 01/19/2018  . Grief reaction 02/24/2017  . Encounter for screening mammogram for breast cancer 12/02/2016  . Chronic pancreatitis ([place]) 08/31/2016  . Colon cancer screening  05/23/2016  . Opioid dependence ([place]) 04/28/2016  . Sickle cell trait ([place]) 11/16/2011  . Seizure disorder (history after stroke last sz late 1990s) 11/16/2011  . Beta thalassemia ([place]) 11/16/2011  . History of asthma 11/16/2011  . Iron deficiency anemia 11/16/2011  . Chronic abdominal pain   . Preventative health care 07/20/2010  . GERD 08/03/2007  . Congenital anomaly of pancreas 02/28/2006  . Hyperlipidemia 02/06/2006  . Essential hypertension 02/06/2006  . Diabetes type 2, controlled ([place]) 02/07/1992    Dorene Ar, PTA 03/11/2019, 10:39 AM  [facility] [address] [place], [place], [address] Phone: [phone]   Fax:  [phone]  Name: Miamor Ayler MRN: [id number] Date of Birth: [birth year]

## 2019-03-12 ENCOUNTER — Encounter: Payer: Self-pay | Admitting: Family Medicine

## 2019-03-12 ENCOUNTER — Ambulatory Visit: Payer: Medicare Other | Admitting: Family Medicine

## 2019-03-12 DIAGNOSIS — M25511 Pain in right shoulder: Secondary | ICD-10-CM

## 2019-03-12 NOTE — Progress Notes (Signed)
Office Visit Note   Patient: [name]           Date of Birth: 1956-02-09           MRN: [id number] Visit Date: 03/12/2019 Requested by: Neva Seat, MD [address] [place],  [place] [address] PCP: Neva Seat, MD  Subjective: Chief Complaint  Patient presents with  . Right Shoulder - Pain    Pain and decreased ROM. Going to PT - some motion has returned slowly, but not all the way.    HPI: She is here for follow-up right shoulder adhesive capsulitis.  She has been doing physical therapy and although the progress has been slow, she feels like she is making progress.  She only has pain when she tries to move her arm at the extremes.               ROS:   All other systems were reviewed and are negative.  Objective: Vital Signs: There were no vitals taken for this visit.  Physical Exam:  General:  Alert and oriented, in no acute distress. Pulm:  Breathing unlabored. Psy:  Normal mood, congruent affect.   Right shoulder: She has passive and active abduction of 75 degrees, external rotation about 50 degrees and internal rotation about 45.  No pain with resisted strength testing.  Imaging: None today.  Assessment & Plan: 1.  Slowly improving right shoulder adhesive capsulitis -Continue with physical therapy.  If pain worsens or if she reaches a plateau, we will try a glenohumeral injection.     Procedures: No procedures performed  No notes on file     PMFS History: Patient Active Problem List   Diagnosis Date Noted  . Bursitis of right shoulder 09/19/2018  . Right calf pain 05/18/2018  . Arthralgia 01/19/2018  . Grief reaction 02/24/2017  . Encounter for screening mammogram for breast cancer 12/02/2016  . Chronic pancreatitis ([place]) 08/31/2016  . Colon cancer screening 05/23/2016  . Opioid dependence ([place]) 04/28/2016  . Sickle cell trait ([place]) 11/16/2011  . Seizure disorder (history after stroke last sz late 1990s) 11/16/2011  . Beta  thalassemia ([place]) 11/16/2011  . History of asthma 11/16/2011  . Iron deficiency anemia 11/16/2011  . Chronic abdominal pain   . Preventative health care 07/20/2010  . GERD 08/03/2007  . Congenital anomaly of pancreas 02/28/2006  . Hyperlipidemia 02/06/2006  . Essential hypertension 02/06/2006  . Diabetes type 2, controlled ([place]) 02/07/1992   Past Medical History:  Diagnosis Date  . Beta thalassemia ([place])   . Blood dyscrasia    thalasemia, sickle cell trait  . Childhood asthma   . Chronic abdominal pain    Unclear etiology, thought to be due to chronic pancreatitis previously in setting of her pancreatic divisum - however, EUS and EGD performed at [place]  (10/05/2011) showing normal esophageall, gastric, duodenal mucosa. EUS showing no pancreatic masses, cysts, or changes of chronic pancreatitis. Biliary system nondilated and had no endosonographic abnormalities.  . CVA (cerebral vascular accident) ([place]) 1993   Per report by patient she had a stroke and prolonged rehab course; still "weak on left side but not much" (03/29/2017)  . Family history of adverse reaction to anesthesia    "mom took a long while to wake up; she was allergic to it" (03/29/2017)  . History of blood transfusion 1980s   "related to minor sickle cell crisis" (03/29/2017)  . HLD (hyperlipidemia) 2009  . Hypertension   . Migraine headache    "  q couple years now" (03/29/2017)  . Palpitation 07/18/2016  . Pancreatic divisum    S/P ERCP with stenting 07/2010 at [facility], then stent removal (08/05/2010)  . Pneumonia    "had it in my teens 3 times" (03/29/2017)  . Pulmonary embolus ([place]) 08/2004   Two areas of V/Q mismatch. Findings compatible  with high probability for pulmonary embolus.; pt was on coumadin for 1 year  . Seizures ([place]) 1993   "after the stroke in 1993; none for years now"  (03/29/2017)  . Sickle cell trait ([place])   . Thalassemia   . Type II diabetes mellitus ([place]) 2010   well controlled  .  Uterine cancer ([place])     Family History  Problem Relation Age of Onset  . Prostate cancer Father   . Cancer Father        stomach ca died x 1 year ago  . Sickle cell anemia Brother   . Lung cancer Maternal Aunt   . Lung cancer Maternal Uncle   . Breast cancer Maternal Grandmother   . Pancreatic cancer Maternal Grandmother   . Heart disease Maternal Grandfather   . Heart disease Paternal Grandfather   . Diabetes Other   . Cancer Other        multiple cancers pancreas,colon, breast    Past Surgical History:  Procedure Laterality Date  . ABDOMINAL HYSTERECTOMY  1980   2/2 endometriosis  . APPENDECTOMY  ?1980   "I think I've had it out" (11/20/2012)  . BREAST LUMPECTOMY Bilateral 1980's   "in my milk ducts; both benign" (02/27/2012)  . CORONARY ANGIOGRAM Bilateral 03/17/2011   Procedure: CORONARY ANGIOGRAM;  Surgeon: Jettie Booze, MD;  Location: [facility] CATH LAB;  Service: Cardiovascular;  Laterality: Bilateral;  . ERCP  07/2010   w/stent placement  at [facility], then stent removal (08/05/2010)  . ESOPHAGOGASTRODUODENOSCOPY N/A 10/26/2013   Procedure: ESOPHAGOGASTRODUODENOSCOPY (EGD);  Surgeon: Jerene Bears, MD;  Location: [facility] ENDOSCOPY;  Service: Endoscopy;  Laterality: N/A;  . Pancreatic stent placement/removal     placed in 2011; removed in 2012/H&P (02/27/2012); "I've had several" (03/29/2017)  . SPHINCTEROTOMY     Archie Endo 11/20/2012   Social History   Occupational History    Employer: UNEMPLOYED  Tobacco Use  . Smoking status: Former Smoker    Packs/day: 0.50    Years: 30.00    Pack years: 15.00    Types: Cigarettes    Quit date: 01/01/1999    Years since quitting: 20.2  . Smokeless tobacco: Never Used  Substance and Sexual Activity  . Alcohol use: No    Alcohol/week: 0.0 standard drinks  . Drug use: No  . Sexual activity: Yes    Partners: Male

## 2019-03-13 ENCOUNTER — Ambulatory Visit: Payer: Medicare Other | Admitting: Physical Therapy

## 2019-03-18 ENCOUNTER — Telehealth: Payer: Self-pay | Admitting: Internal Medicine

## 2019-03-18 ENCOUNTER — Ambulatory Visit: Payer: Medicare Other | Admitting: Physical Therapy

## 2019-03-18 ENCOUNTER — Encounter: Payer: Self-pay | Admitting: Physical Therapy

## 2019-03-18 ENCOUNTER — Other Ambulatory Visit: Payer: Self-pay

## 2019-03-18 DIAGNOSIS — M6281 Muscle weakness (generalized): Secondary | ICD-10-CM | POA: Diagnosis not present

## 2019-03-18 DIAGNOSIS — M25611 Stiffness of right shoulder, not elsewhere classified: Secondary | ICD-10-CM

## 2019-03-18 DIAGNOSIS — K219 Gastro-esophageal reflux disease without esophagitis: Secondary | ICD-10-CM

## 2019-03-18 DIAGNOSIS — M25511 Pain in right shoulder: Secondary | ICD-10-CM

## 2019-03-18 NOTE — Telephone Encounter (Signed)
Pt requesting a call back her medication.

## 2019-03-18 NOTE — Telephone Encounter (Signed)
Pt would like something for heartburn, states she had zantac before and cannot get it now

## 2019-03-18 NOTE — Therapy (Signed)
[place], [place], [address] Phone: [phone]   Fax:  [phone]  Physical Therapy Treatment  Patient Details  Name: [name] MRN: [id number] Date of Birth: [birth year] Referring Provider (PT): Dr. Eunice Blase   Encounter Date: 03/18/2019  PT End of Session - 03/18/19 0848    Visit Number  18    Number of Visits  23    Date for PT Re-Evaluation  03/29/19    Authorization Type  N/A    PT Start Time  0845    PT Stop Time  0938    PT Time Calculation (min)  53 min       Past Medical History:  Diagnosis Date  . Beta thalassemia ([place])   . Blood dyscrasia    thalasemia, sickle cell trait  . Childhood asthma   . Chronic abdominal pain    Unclear etiology, thought to be due to chronic pancreatitis previously in setting of her pancreatic divisum - however, EUS and EGD performed at [facility]  (10/05/2011) showing normal esophageall, gastric, duodenal mucosa. EUS showing no pancreatic masses, cysts, or changes of chronic pancreatitis. Biliary system nondilated and had no endosonographic abnormalities.  . CVA (cerebral vascular accident) ([place]) 1993   Per report by patient she had a stroke and prolonged rehab course; still "weak on left side but not much" (03/29/2017)  . Family history of adverse reaction to anesthesia    "mom took a long while to wake up; she was allergic to it" (03/29/2017)  . History of blood transfusion 1980s   "related to minor sickle cell crisis" (03/29/2017)  . HLD (hyperlipidemia) 2009  . Hypertension   . Migraine headache    "q couple years now" (03/29/2017)  . Palpitation 07/18/2016  . Pancreatic divisum    S/P ERCP with stenting 07/2010 at [facility], then stent removal (08/05/2010)  . Pneumonia    "had it in my teens 3 times" (03/29/2017)  . Pulmonary embolus ([place]) 08/2004   Two areas of V/Q mismatch. Findings compatible  with high probability for pulmonary  embolus.; pt was on coumadin for 1 year  . Seizures ([place]) 1993   "after the stroke in 1993; none for years now"  (03/29/2017)  . Sickle cell trait ([place])   . Thalassemia   . Type II diabetes mellitus ([place]) 2010   well controlled  . Uterine cancer [facility])     Past Surgical History:  Procedure Laterality Date  . ABDOMINAL HYSTERECTOMY  1980   2/2 endometriosis  . APPENDECTOMY  ?1980   "I think I've had it out" (11/20/2012)  . BREAST LUMPECTOMY Bilateral 1980's   "in my milk ducts; both benign" (02/27/2012)  . CORONARY ANGIOGRAM Bilateral 03/17/2011   Procedure: CORONARY ANGIOGRAM;  Surgeon: Jettie Booze, MD;  Location: [facility] CATH LAB;  Service: Cardiovascular;  Laterality: Bilateral;  . ERCP  07/2010   w/stent placement  at [facility], then stent removal (08/05/2010)  . ESOPHAGOGASTRODUODENOSCOPY N/A 10/26/2013   Procedure: ESOPHAGOGASTRODUODENOSCOPY (EGD);  Surgeon: Jerene Bears, MD;  Location: [facility] ENDOSCOPY;  Service: Endoscopy;  Laterality: N/A;  . Pancreatic stent placement/removal     placed in 2011; removed in 2012/H&P (02/27/2012); "I've had several" (03/29/2017)  . SPHINCTEROTOMY     [facility] Endo 11/20/2012    There were no vitals filed for this visit.  Subjective Assessment - 03/18/19 0847    Subjective  Saw MD last week and he wants me to continue PT for awhile.  Currently in Pain?  Yes    Pain Score  5     Pain Location  Shoulder    Pain Orientation  Right    Pain Descriptors / Indicators  Aching    Aggravating Factors   reaching    Pain Relieving Factors  heat, asper cream, keep working it                       [facility] Adult PT Treatment/Exercise - 03/18/19 0001      Shoulder Exercises: Supine   Horizontal ABduction  20 reps    Theraband Level (Shoulder Horizontal ABduction)  Level 4 (Blue)    Other Supine Exercises  chest press with 4# x 15      Shoulder Exercises: Standing   External Rotation  20 reps    Theraband Level (Shoulder External Rotation)   Level 3 (Green)    Internal Rotation  Strengthening;Right;20 reps    Theraband Level (Shoulder Internal Rotation)  Level 3 (Green)    Flexion Limitations  rockwood punch green band x 20     Extension  20 reps    Theraband Level (Shoulder Extension)  Level 3 (Green)    Row  20 reps    Theraband Level (Shoulder Row)  Level 3 (Green)    Diagonals  --    Theraband Level (Shoulder Diagonals)  --    Other Standing Exercises  RUE wall slide flexion with pillow case     Other Standing Exercises  2# cabinet reaching middle shelf , 4# over head press using bilat UE  , 2# lateral raise and forward raise 2x 10 each       Shoulder Exercises: ROM/Strengthening   UBE (Upper Arm Bike)  6 min total L2,  forward/back     Other ROM/Strengthening Exercises  --      Shoulder Exercises: Stretch   Corner Stretch Limitations  doorway stretch  x 3     Internal Rotation Stretch Limitations  towel in axiall , self mob followed by PROM      Manual Therapy   Manual therapy comments  PROM flexion, IR, ER, scaption, Adbuction               PT Short Term Goals - 03/04/19 0846      PT SHORT TERM GOAL #1   Title  Pt will be I with initial HEP    Baseline  pt reports doing them as much as she can with no questions    Time  4    Period  Weeks    Status  Achieved    Target Date  12/19/18      PT SHORT TERM GOAL #2   Title  Pt will be able to raise arm to shoulder height (flexion and abduction) , 100 deg with pain no more than 4/10 most of the time for ADLs, housework    Baseline  able to flex to 120 , 117 abdct no pain    Time  4    Period  Weeks    Status  Achieved    Target Date  03/04/19      PT SHORT TERM GOAL #3   Title  Pt will be able to use Rt arm to chop, cook and at waist height without increased pain    Baseline  much improved, some difficulty with large hard items to chop, improved abiltiy to use caneopener    Time  4    Period  Weeks    Status  Achieved    Target Date  03/04/19         PT Long Term Goals - 03/04/19 0846      PT LONG TERM GOAL #1   Title  Pt will demonstrate full AROM of Rt UE within 20 deg of Lt UE for full mobility of upper body    Baseline  RT shoulder flexion 95     abduction  101            ER   20 at 80 degrees abduction             IR   26 degrees   shoulder at 80 degrees    Time  8    Period  Weeks    Status  On-going      PT LONG TERM GOAL #2   Title  Pt will be able to sleep without interruption from pain most nights of the week.    Baseline  sleep interrupted 1 out of 7 nights per week. No longer need pillow under arm.    Time  8    Period  Weeks    Status  On-going      PT LONG TERM GOAL #3   Title  Pt will be able to ;get dressed, washed and do light home tasks without limitation from pain    Baseline  better, modifies things but is able to complete dressing and showering using the wall in shower to assist hoding arm up    Period  Weeks    Status  Partially Met      PT LONG TERM GOAL #4   Title  Pt will demo Rt UE strength to 4/5 or more for maximal shoulder function.    Baseline  3+/5 R UE shoulder flexion    Period  Weeks    Status  On-going      PT LONG TERM GOAL #5   Title  Pt will be I with HEP upon discharge    Time  8    Period  Weeks    Status  On-going      PT LONG TERM GOAL #6   Title  Pt will score FOTO limited no more than 35%    Time  8    Period  Weeks    Status  On-going            Plan - 03/18/19 0942    Clinical Impression Statement  Pt reports MD wants her to continue PT until the end of the year. He might inject the shoulder at the end of November. Continued wiith strengthening and ROM. Good tolerance to increased reps and resistance. HMP at end of session. She is tender with PROM and is unable to tolerate end range PROM.    PT Next Visit Plan  ROM, strength, modalities for pain. Try closed chain (quad, wall), assess ionto    PT Home Exercise Plan  supine cane, table slides, red band ER/IR and  horiz abd unattached, sleeper and corner stretch, wall slides flexion and abduction(scaption), wall slides       Patient will benefit from skilled therapeutic intervention in order to improve the following deficits and impairments:  Pain, Decreased activity tolerance, Decreased range of motion, Decreased strength, Impaired UE functional use, Impaired flexibility, Decreased mobility  Visit Diagnosis: Muscle weakness (generalized)  Stiffness of right shoulder, not elsewhere classified  Acute pain of right shoulder     Problem  List Patient Active Problem List   Diagnosis Date Noted  . Bursitis of right shoulder 09/19/2018  . Right calf pain 05/18/2018  . Arthralgia 01/19/2018  . Grief reaction 02/24/2017  . Encounter for screening mammogram for breast cancer 12/02/2016  . Chronic pancreatitis ([place]) 08/31/2016  . Colon cancer screening 05/23/2016  . Opioid dependence ([place]) 04/28/2016  . Sickle cell trait ([place]) 11/16/2011  . Seizure disorder (history after stroke last sz late 1990s) 11/16/2011  . Beta thalassemia ([place]) 11/16/2011  . History of asthma 11/16/2011  . Iron deficiency anemia 11/16/2011  . Chronic abdominal pain   . Preventative health care 07/20/2010  . GERD 08/03/2007  . Congenital anomaly of pancreas 02/28/2006  . Hyperlipidemia 02/06/2006  . Essential hypertension 02/06/2006  . Diabetes type 2, controlled ([place]) 02/07/1992    Dorene Ar, PTA 03/18/2019, 9:45 AM  [facility] [address] [place], [place], [address] Phone: [phone]   Fax:  [phone]  Name: Tyera Hansley MRN: [id number] Date of Birth: 10-22-1955

## 2019-03-20 ENCOUNTER — Encounter: Payer: Self-pay | Admitting: Physical Therapy

## 2019-03-20 ENCOUNTER — Other Ambulatory Visit: Payer: Self-pay

## 2019-03-20 ENCOUNTER — Ambulatory Visit: Payer: Medicare Other | Admitting: Physical Therapy

## 2019-03-20 DIAGNOSIS — M25611 Stiffness of right shoulder, not elsewhere classified: Secondary | ICD-10-CM

## 2019-03-20 DIAGNOSIS — M25511 Pain in right shoulder: Secondary | ICD-10-CM

## 2019-03-20 DIAGNOSIS — M6281 Muscle weakness (generalized): Secondary | ICD-10-CM

## 2019-03-20 NOTE — Therapy (Signed)
[place], [place], [address] Phone: [phone]   Fax:  [phone]  Physical Therapy Treatment  Patient Details  Name: [name] MRN: [id number] Date of Birth: 1955/10/21 Referring Provider (PT): Dr. Eunice Blase   Encounter Date: 03/20/2019  PT End of Session - 03/20/19 0904    Visit Number  19    Number of Visits  23    Date for PT Re-Evaluation  03/29/19    Authorization Type  N/A    PT Start Time  0847    PT Stop Time  0940    PT Time Calculation (min)  53 min       Past Medical History:  Diagnosis Date  . Beta thalassemia ([place])   . Blood dyscrasia    thalasemia, sickle cell trait  . Childhood asthma   . Chronic abdominal pain    Unclear etiology, thought to be due to chronic pancreatitis previously in setting of her pancreatic divisum - however, EUS and EGD performed at [facility]  (10/05/2011) showing normal esophageall, gastric, duodenal mucosa. EUS showing no pancreatic masses, cysts, or changes of chronic pancreatitis. Biliary system nondilated and had no endosonographic abnormalities.  . CVA (cerebral vascular accident) ([place]) 1993   Per report by patient she had a stroke and prolonged rehab course; still "weak on left side but not much" (03/29/2017)  . Family history of adverse reaction to anesthesia    "mom took a long while to wake up; she was allergic to it" (03/29/2017)  . History of blood transfusion 1980s   "related to minor sickle cell crisis" (03/29/2017)  . HLD (hyperlipidemia) 2009  . Hypertension   . Migraine headache    "q couple years now" (03/29/2017)  . Palpitation 07/18/2016  . Pancreatic divisum    S/P ERCP with stenting 07/2010 at [facility], then stent removal (08/05/2010)  . Pneumonia    "had it in my teens 3 times" (03/29/2017)  . Pulmonary embolus ([place]) 08/2004   Two areas of V/Q mismatch. Findings compatible  with high probability for pulmonary  embolus.; pt was on coumadin for 1 year  . Seizures ([place]) 1993   "after the stroke in 1993; none for years now"  (03/29/2017)  . Sickle cell trait ([place])   . Thalassemia   . Type II diabetes mellitus ([place]) 2010   well controlled  . Uterine cancer [facility])     Past Surgical History:  Procedure Laterality Date  . ABDOMINAL HYSTERECTOMY  1980   2/2 endometriosis  . APPENDECTOMY  ?1980   "I think I've had it out" (11/20/2012)  . BREAST LUMPECTOMY Bilateral 1980's   "in my milk ducts; both benign" (02/27/2012)  . CORONARY ANGIOGRAM Bilateral 03/17/2011   Procedure: CORONARY ANGIOGRAM;  Surgeon: Jettie Booze, MD;  Location: [facility] CATH LAB;  Service: Cardiovascular;  Laterality: Bilateral;  . ERCP  07/2010   w/stent placement  at [facility], then stent removal (08/05/2010)  . ESOPHAGOGASTRODUODENOSCOPY N/A 10/26/2013   Procedure: ESOPHAGOGASTRODUODENOSCOPY (EGD);  Surgeon: Jerene Bears, MD;  Location[facility] ENDOSCOPY;  Service: Endoscopy;  Laterality: N/A;  . Pancreatic stent placement/removal     placed in 2011; removed in 2012/H&P (02/27/2012); "I've had several" (03/29/2017)  . SPHINCTEROTOMY     Archie Endo 11/20/2012    There were no vitals filed for this visit.  Subjective Assessment - 03/20/19 0855    Subjective  Achey and sore.    Currently in Pain?  Yes    Pain Score  5     Pain Location  Shoulder    Pain Orientation  Right    Pain Descriptors / Indicators  Aching;Sore         [facility] PT Assessment - 03/20/19 0001      PROM   Overall PROM Comments  137 PROM flexion, 128 Abduction , 55 ER i@ 45 degrees abduction, IR 70                    [facility] Adult PT Treatment/Exercise - 03/20/19 0001      Shoulder Exercises: Supine   Horizontal ABduction  20 reps    Theraband Level (Shoulder Horizontal ABduction)  Level 3 (Green)    External Rotation  20 reps    Theraband Level (Shoulder External Rotation)  Level 3 (Green)    Other Supine Exercises  chest press with 3# x 15  (bilat) , single RUE chest press       Shoulder Exercises: Sidelying   External Rotation  20 reps    External Rotation Weight (lbs)  3    ABduction  20 reps    ABduction Weight (lbs)  2      Shoulder Exercises: Standing   Extension  20 reps    Theraband Level (Shoulder Extension)  Level 3 (Green)    Row  20 reps    Theraband Level (Shoulder Row)  Level 3 (Green)      Shoulder Exercises: ROM/Strengthening   UBE (Upper Arm Bike)  6 min total L2,  forward/back       Manual Therapy   Manual therapy comments  PROM flexion, IR, ER, scaption, Adbuction               PT Short Term Goals - 03/04/19 0846      PT SHORT TERM GOAL #1   Title  Pt will be I with initial HEP    Baseline  pt reports doing them as much as she can with no questions    Time  4    Period  Weeks    Status  Achieved    Target Date  12/19/18      PT SHORT TERM GOAL #2   Title  Pt will be able to raise arm to shoulder height (flexion and abduction) , 100 deg with pain no more than 4/10 most of the time for ADLs, housework    Baseline  able to flex to 120 , 117 abdct no pain    Time  4    Period  Weeks    Status  Achieved    Target Date  03/04/19      PT SHORT TERM GOAL #3   Title  Pt will be able to use Rt arm to chop, cook and at waist height without increased pain    Baseline  much improved, some difficulty with large hard items to chop, improved abiltiy to use caneopener    Time  4    Period  Weeks    Status  Achieved    Target Date  03/04/19        PT Long Term Goals - 03/04/19 0846      PT LONG TERM GOAL #1   Title  Pt will demonstrate full AROM of Rt UE within 20 deg of Lt UE for full mobility of upper body    Baseline  RT shoulder flexion 95     abduction  101  ER   20 at 80 degrees abduction             IR   26 degrees   shoulder at 80 degrees    Time  8    Period  Weeks    Status  On-going      PT LONG TERM GOAL #2   Title  Pt will be able to sleep without interruption  from pain most nights of the week.    Baseline  sleep interrupted 1 out of 7 nights per week. No longer need pillow under arm.    Time  8    Period  Weeks    Status  On-going      PT LONG TERM GOAL #3   Title  Pt will be able to ;get dressed, washed and do light home tasks without limitation from pain    Baseline  better, modifies things but is able to complete dressing and showering using the wall in shower to assist hoding arm up    Period  Weeks    Status  Partially Met      PT LONG TERM GOAL #4   Title  Pt will demo Rt UE strength to 4/5 or more for maximal shoulder function.    Baseline  3+/5 R UE shoulder flexion    Period  Weeks    Status  On-going      PT LONG TERM GOAL #5   Title  Pt will be I with HEP upon discharge    Time  8    Period  Weeks    Status  On-going      PT LONG TERM GOAL #6   Title  Pt will score FOTO limited no more than 35%    Time  8    Period  Weeks    Status  On-going            Plan - 03/20/19 0914    Clinical Impression Statement  Decreased intensity therex today due to patient reporting a pancreatitis exacerbation which has her not feeling well. She requested to perform mat therex today. She demonstrates improved overhead reaching in supine.    PT Next Visit Plan  ROM, strength, modalities for pain. Try closed chain (quad, wall), assess ionto    PT Home Exercise Plan  supine cane, table slides, red band ER/IR and horiz abd unattached, sleeper and corner stretch, wall slides flexion and abduction(scaption), wall slides       Patient will benefit from skilled therapeutic intervention in order to improve the following deficits and impairments:  Pain, Decreased activity tolerance, Decreased range of motion, Decreased strength, Impaired UE functional use, Impaired flexibility, Decreased mobility  Visit Diagnosis: Muscle weakness (generalized)  Stiffness of right shoulder, not elsewhere classified  Acute pain of right  shoulder     Problem List Patient Active Problem List   Diagnosis Date Noted  . Bursitis of right shoulder 09/19/2018  . Right calf pain 05/18/2018  . Arthralgia 01/19/2018  . Grief reaction 02/24/2017  . Encounter for screening mammogram for breast cancer 12/02/2016  . Chronic pancreatitis ([place]) 08/31/2016  . Colon cancer screening 05/23/2016  . Opioid dependence ([place]) 04/28/2016  . Sickle cell trait ([place]) 11/16/2011  . Seizure disorder (history after stroke last sz late 1990s) 11/16/2011  . Beta thalassemia ([place]) 11/16/2011  . History of asthma 11/16/2011  . Iron deficiency anemia 11/16/2011  . Chronic abdominal pain   . Preventative health care 07/20/2010  . GERD  08/03/2007  . Congenital anomaly of pancreas 02/28/2006  . Hyperlipidemia 02/06/2006  . Essential hypertension 02/06/2006  . Diabetes type 2, controlled ([place]) 02/07/1992    Dorene Ar, PTA 03/20/2019, 9:31 AM  [place], [place], [address] Phone: [phone]   Fax:  [phone]  Name: [name] MRN: [id number] Date of Birth: 08-18-1955

## 2019-03-21 MED ORDER — OMEPRAZOLE 20 MG PO CPDR
20.0000 mg | DELAYED_RELEASE_CAPSULE | Freq: Two times a day (BID) | ORAL | 2 refills | Status: DC
Start: 2019-03-21 — End: 2019-04-08

## 2019-03-21 NOTE — Telephone Encounter (Signed)
It appears the medication was discontinued for unclear reason at an ortho visit on 11/14/2018, possibly due to it no longer being available as it was not an option when I attempted to order it today. Will send in Omeprazole 20mg  BID.

## 2019-03-21 NOTE — Assessment & Plan Note (Signed)
It appears Zantac was discontinued for unclear reason at an ortho visit on 11/14/2018, possibly due to it no longer being available as it was not an option when I attempted to order it today. Will send in Omeprazole 20mg  BID.

## 2019-03-25 ENCOUNTER — Other Ambulatory Visit: Payer: Self-pay

## 2019-03-25 ENCOUNTER — Encounter: Payer: Self-pay | Admitting: Physical Therapy

## 2019-03-25 ENCOUNTER — Ambulatory Visit: Payer: Medicare Other | Admitting: Physical Therapy

## 2019-03-25 ENCOUNTER — Other Ambulatory Visit: Payer: Self-pay | Admitting: Internal Medicine

## 2019-03-25 DIAGNOSIS — M25611 Stiffness of right shoulder, not elsewhere classified: Secondary | ICD-10-CM

## 2019-03-25 DIAGNOSIS — M25511 Pain in right shoulder: Secondary | ICD-10-CM

## 2019-03-25 DIAGNOSIS — M6281 Muscle weakness (generalized): Secondary | ICD-10-CM | POA: Diagnosis not present

## 2019-03-25 DIAGNOSIS — Q453 Other congenital malformations of pancreas and pancreatic duct: Secondary | ICD-10-CM

## 2019-03-25 NOTE — Therapy (Signed)
[place], [place], [address] Phone: [phone]   Fax:  [phone]  Physical Therapy Treatment  Patient Details  Name: [name] MRN: [id number] Date of Birth: [birth year] Referring Provider (PT): Dr. Eunice Blase   Encounter Date: 03/25/2019  PT End of Session - 03/25/19 0924    Visit Number  20    Number of Visits  23    PT Start Time  0915    PT Stop Time  1010    PT Time Calculation (min)  55 min    Activity Tolerance  Patient tolerated treatment well    Behavior During Therapy  [facility] for tasks assessed/performed       Past Medical History:  Diagnosis Date  . Beta thalassemia ([place])   . Blood dyscrasia    thalasemia, sickle cell trait  . Childhood asthma   . Chronic abdominal pain    Unclear etiology, thought to be due to chronic pancreatitis previously in setting of her pancreatic divisum - however, EUS and EGD performed at [place]  (10/05/2011) showing normal esophageall, gastric, duodenal mucosa. EUS showing no pancreatic masses, cysts, or changes of chronic pancreatitis. Biliary system nondilated and had no endosonographic abnormalities.  . CVA (cerebral vascular accident) ([place]) 1993   Per report by patient she had a stroke and prolonged rehab course; still "weak on left side but not much" (03/29/2017)  . Family history of adverse reaction to anesthesia    "mom took a long while to wake up; she was allergic to it" (03/29/2017)  . History of blood transfusion 1980s   "related to minor sickle cell crisis" (03/29/2017)  . HLD (hyperlipidemia) 2009  . Hypertension   . Migraine headache    "q couple years now" (03/29/2017)  . Palpitation 07/18/2016  . Pancreatic divisum    S/P ERCP with stenting 07/2010 at [facility], then stent removal (08/05/2010)  . Pneumonia    "had it in my teens 3 times" (03/29/2017)  . Pulmonary embolus ([place]) 08/2004   Two areas of V/Q mismatch. Findings  compatible  with high probability for pulmonary embolus.; pt was on coumadin for 1 year  . Seizures ([place]) 1993   "after the stroke in 1993; none for years now"  (03/29/2017)  . Sickle cell trait ([place])   . Thalassemia   . Type II diabetes mellitus ([place]) 2010   well controlled  . Uterine cancer [facility])     Past Surgical History:  Procedure Laterality Date  . ABDOMINAL HYSTERECTOMY  1980   2/2 endometriosis  . APPENDECTOMY  ?1980   "I think I've had it out" (11/20/2012)  . BREAST LUMPECTOMY Bilateral 1980's   "in my milk ducts; both benign" (02/27/2012)  . CORONARY ANGIOGRAM Bilateral 03/17/2011   Procedure: CORONARY ANGIOGRAM;  Surgeon: Jettie Booze, MD;  Location: [facility] CATH LAB;  Service: Cardiovascular;  Laterality: Bilateral;  . ERCP  07/2010   w/stent placement  at [facility], then stent removal (08/05/2010)  . ESOPHAGOGASTRODUODENOSCOPY N/A 10/26/2013   Procedure: ESOPHAGOGASTRODUODENOSCOPY (EGD);  Surgeon: Jerene Bears, MD;  Location: [facility] ENDOSCOPY;  Service: Endoscopy;  Laterality: N/A;  . Pancreatic stent placement/removal     placed in 2011; removed in 2012/H&P (02/27/2012); "I've had several" (03/29/2017)  . SPHINCTEROTOMY     Archie Endo 11/20/2012    There were no vitals filed for this visit.  Subjective Assessment - 03/25/19 0921    Subjective  Pt arriving to therapy reporting, "my shoulder feels much better". Pt  reporting pain of 1/10.    Pertinent History  diabetes, CVA, sickle cell trait, pancreatits    Limitations  Lifting;Writing;House hold activities    Patient Stated Goals  Patient would like to be able to use her arm , less pain, sleep better    Currently in Pain?  Yes    Pain Score  1     Pain Location  Shoulder    Pain Orientation  Right    Pain Descriptors / Indicators  Sore    Pain Type  Chronic pain    Pain Onset  More than a month ago    Pain Frequency  Intermittent         [facility] PT Assessment - 03/25/19 0001      Assessment   Medical Diagnosis  R  shoulder pain    Referring Provider (PT)  Dr. Legrand Como Hilts    Hand Dominance  Right    Prior Therapy  no      Precautions   Precautions  None      Restrictions   Weight Bearing Restrictions  No      Balance Screen   Has the patient fallen in the past 6 months  No      Observation/Other Assessments   Focus on Therapeutic Outcomes (FOTO)   45% limitation      PROM   Overall PROM Comments  135 flexion, 130 Abduction, 55 ER in 45 degrees abduction, IR 70 degrees                   [facility] Adult PT Treatment/Exercise - 03/25/19 0001      Shoulder Exercises: Supine   Horizontal ABduction  20 reps    Theraband Level (Shoulder Horizontal ABduction)  Level 3 (Green)    External Rotation  20 reps    Theraband Level (Shoulder External Rotation)  Level 3 (Green)    Other Supine Exercises  chest press with 3# x 15 (bilat) , single RUE chest press       Shoulder Exercises: Sidelying   External Rotation  20 reps    External Rotation Weight (lbs)  3    ABduction  20 reps    ABduction Weight (lbs)  2      Shoulder Exercises: Standing   Extension  20 reps    Theraband Level (Shoulder Extension)  Level 3 (Green)    Row  20 reps    Theraband Level (Shoulder Row)  Level 3 (Green)      Shoulder Exercises: ROM/Strengthening   UBE (Upper Arm Bike)  6 min total L2,  forward/back       Modalities   Modalities  Moist Heat      Moist Heat Therapy   Number Minutes Moist Heat  8 Minutes    Moist Heat Location  Shoulder      Manual Therapy   Manual therapy comments  PROM flexion, IR, ER, scaption, Adbuction    Joint Mobilization  grade III shoulder and scapular mobs               PT Short Term Goals - 03/25/19 9323      PT SHORT TERM GOAL #1   Title  Pt will be I with initial HEP    Baseline  pt reports doing them as much as she can with no questions    Period  Weeks    Status  Achieved    Target Date  12/19/18      PT SHORT  TERM GOAL #2   Title  Pt will be able  to raise arm to shoulder height (flexion and abduction) , 100 deg with pain no more than 4/10 most of the time for ADLs, housework    Baseline  able to flex > 135 , 130 abdct no pain    Time  4    Period  Weeks    Status  Achieved    Target Date  03/04/19      PT SHORT TERM GOAL #3   Title  Pt will be able to use Rt arm to chop, cook and at waist height without increased pain    Baseline  much improved, some difficulty with large hard items to chop, improved abiltiy to use caneopener    Status  Achieved    Target Date  03/04/19        PT Long Term Goals - 03/25/19 0952      PT LONG TERM GOAL #1   Title  Pt will demonstrate full AROM of Rt UE within 20 deg of Lt UE for full mobility of upper body    Baseline  RT shoulder flexion 135     abduction  130          ER   55 degrees in 45 degrees of abduction             IR  70 degrees    Time  8    Period  Weeks    Status  On-going      PT LONG TERM GOAL #2   Title  Pt will be able to sleep without interruption from pain most nights of the week.    Baseline  Pt reporting still reporting 1 night a week sleeping with no pain.    Time  8    Period  Weeks    Status  On-going      PT LONG TERM GOAL #3   Title  Pt will be able to ;get dressed, washed and do light home tasks without limitation from pain    Baseline  better, modifies things but is able to complete dressing and showering using the wall in shower to assist hoding arm up    Period  Weeks    Status  Partially Met      PT LONG TERM GOAL #4   Title  Pt will demo Rt UE strength to 4/5 or more for maximal shoulder function.    Baseline  3+/5 R UE shoulder flexion    Time  8    Period  Weeks    Status  On-going      PT LONG TERM GOAL #5   Title  Pt will be I with HEP upon discharge    Time  8    Period  Weeks    Status  On-going      PT LONG TERM GOAL #6   Title  Pt will score FOTO limited no more than 35%    Baseline  FOTO:45 % limitation 03/25/2019    Time  8     Period  Weeks    Status  On-going            Plan - 03/25/19 0947    Clinical Impression Statement  Pt presenting today with decreased soreness of 1/10 today and reported being consistent with her HEP. Pt with improvements in her flexion and abduction, but pt still having difficutly with ER and reaching the back of her head.  Progressing more with UE strengthening. Continue skilled PT to progress toward LTG's set.    Personal Factors and Comorbidities  Comorbidity 1;Comorbidity 2    Comorbidities  diabetes, pancreatitis    Examination-Activity Limitations  Lift;Bed Mobility;Transfers;Reach Overhead;Hygiene/Grooming;Dressing    Examination-Participation Restrictions  Laundry;Community Activity;Yard Work;Cleaning;Meal Prep    Stability/Clinical Decision Making  Evolving/Moderate complexity    Rehab Potential  Excellent    PT Frequency  2x / week    PT Duration  4 weeks    PT Treatment/Interventions  ADLs/Self Care Home Management;Iontophoresis 9m/ml Dexamethasone;Passive range of motion;Ultrasound;Cryotherapy;Electrical Stimulation;Therapeutic exercise;Therapeutic activities;Patient/family education;Manual techniques;Taping;Moist Heat;Functional mobility training    PT Next Visit Plan  ROM, strength, modalities for pain. Try closed chain (quad, wall), assess ionto    PT Home Exercise Plan  supine cane, table slides, red band ER/IR and horiz abd unattached, sleeper and corner stretch, wall slides flexion and abduction(scaption), wall slides    Consulted and Agree with Plan of Care  Patient       Patient will benefit from skilled therapeutic intervention in order to improve the following deficits and impairments:  Pain, Decreased activity tolerance, Decreased range of motion, Decreased strength, Impaired UE functional use, Impaired flexibility, Decreased mobility  Visit Diagnosis: Muscle weakness (generalized)  Stiffness of right shoulder, not elsewhere classified  Acute pain of right  shoulder     Problem List Patient Active Problem List   Diagnosis Date Noted  . Bursitis of right shoulder 09/19/2018  . Right calf pain 05/18/2018  . Arthralgia 01/19/2018  . Grief reaction 02/24/2017  . Encounter for screening mammogram for breast cancer 12/02/2016  . Chronic [place]itis ([place] 08/31/2016  . Colon cancer screening 05/23/2016  . Opioid dependence ([place] 04/28/2016  . Sickle cell trait ([place] 11/16/2011  . Seizure disorder (history after stroke last sz late 1990s) 11/16/2011  . Beta thalassemia ([place] 11/16/2011  . History of asthma 11/16/2011  . Iron deficiency anemia 11/16/2011  . Chronic abdominal pain   . Preventative health care 07/20/2010  . GERD 08/03/2007  . Congenital anomaly of pancreas 02/28/2006  . Hyperlipidemia 02/06/2006  . Essential hypertension 02/06/2006  . Diabetes type 2, controlled ([place] 02/07/1992    JOretha Caprice PT 03/25/2019, 10:03 AM  [facility] [address]Phone: [phone]  Fax:  [phone] Name: Eileen SmyreMRN: [id number] of Birth: [birth year]

## 2019-03-26 NOTE — Telephone Encounter (Signed)
Refill approved.

## 2019-03-27 ENCOUNTER — Ambulatory Visit: Payer: Medicare Other | Admitting: Physical Therapy

## 2019-04-05 ENCOUNTER — Ambulatory Visit: Payer: Medicare Other | Attending: Family Medicine | Admitting: Physical Therapy

## 2019-04-05 ENCOUNTER — Encounter: Payer: Self-pay | Admitting: Physical Therapy

## 2019-04-05 ENCOUNTER — Other Ambulatory Visit: Payer: Self-pay

## 2019-04-05 DIAGNOSIS — M25611 Stiffness of right shoulder, not elsewhere classified: Secondary | ICD-10-CM | POA: Diagnosis present

## 2019-04-05 DIAGNOSIS — M6281 Muscle weakness (generalized): Secondary | ICD-10-CM | POA: Insufficient documentation

## 2019-04-05 DIAGNOSIS — M25511 Pain in right shoulder: Secondary | ICD-10-CM | POA: Insufficient documentation

## 2019-04-05 NOTE — Therapy (Signed)
[place], [place], [address] Phone: [phone]   Fax:  [phone]  Physical Therapy Treatment  Patient Details  Name: [name] MRN: [id number] Date of Birth: 1955-08-22 Referring Provider (PT): Dr. Eunice Blase   Encounter Date: 04/05/2019  PT End of Session - 04/05/19 1049    Visit Number  21    Number of Visits  29    Date for PT Re-Evaluation  05/03/19    PT Start Time  6073    PT Stop Time  1140    PT Time Calculation (min)  55 min    Activity Tolerance  Patient tolerated treatment well    Behavior During Therapy  [facility] for tasks assessed/performed       Past Medical History:  Diagnosis Date  . Beta thalassemia ([place])   . Blood dyscrasia    thalasemia, sickle cell trait  . Childhood asthma   . Chronic abdominal pain    Unclear etiology, thought to be due to chronic pancreatitis previously in setting of her pancreatic divisum - however, EUS and EGD performed at [place]  (10/05/2011) showing normal esophageall, gastric, duodenal mucosa. EUS showing no pancreatic masses, cysts, or changes of chronic pancreatitis. Biliary system nondilated and had no endosonographic abnormalities.  . CVA (cerebral vascular accident) ([place]) 1993   Per report by patient she had a stroke and prolonged rehab course; still "weak on left side but not much" (03/29/2017)  . Family history of adverse reaction to anesthesia    "mom took a long while to wake up; she was allergic to it" (03/29/2017)  . History of blood transfusion 1980s   "related to minor sickle cell crisis" (03/29/2017)  . HLD (hyperlipidemia) 2009  . Hypertension   . Migraine headache    "q couple years now" (03/29/2017)  . Palpitation 07/18/2016  . Pancreatic divisum    S/P ERCP with stenting 07/2010 at [facility], then stent removal (08/05/2010)  . Pneumonia    "had it in my teens 3 times" (03/29/2017)  . Pulmonary embolus ([place]) 08/2004    Two areas of V/Q mismatch. Findings compatible  with high probability for pulmonary embolus.; pt was on coumadin for 1 year  . Seizures ([place]) 1993   "after the stroke in 1993; none for years now"  (03/29/2017)  . Sickle cell trait ([place])   . Thalassemia   . Type II diabetes mellitus ([place]) 2010   well controlled  . Uterine cancer [facility])     Past Surgical History:  Procedure Laterality Date  . ABDOMINAL HYSTERECTOMY  1980   2/2 endometriosis  . APPENDECTOMY  ?1980   "I think I've had it out" (11/20/2012)  . BREAST LUMPECTOMY Bilateral 1980's   "in my milk ducts; both benign" (02/27/2012)  . CORONARY ANGIOGRAM Bilateral 03/17/2011   Procedure: CORONARY ANGIOGRAM;  Surgeon: Jettie Booze, MD;  Location[facility] CATH LAB;  Service: Cardiovascular;  Laterality: Bilateral;  . ERCP  07/2010   w/stent placement  at [facility], then stent removal (08/05/2010)  . ESOPHAGOGASTRODUODENOSCOPY N/A 10/26/2013   Procedure: ESOPHAGOGASTRODUODENOSCOPY (EGD);  Surgeon: Jerene Bears, MD;  Location: [facility] ENDOSCOPY;  Service: Endoscopy;  Laterality: N/A;  . Pancreatic stent placement/removal     placed in 2011; removed in 2012/H&P (02/27/2012); "I've had several" (03/29/2017)  . SPHINCTEROTOMY     Archie Endo 11/20/2012    There were no vitals filed for this visit.  Subjective Assessment - 04/05/19 1047    Subjective  Shoulder is  stiff and sore not really hurting.  I'll tell you when we are done.    Currently in Pain?  No/denies         [facility] PT Assessment - 04/05/19 0001      Assessment   Medical Diagnosis  R shoulder pain    Referring Provider (PT)  Dr. Legrand Como Hilts    Hand Dominance  Right    Prior Therapy  no      Precautions   Precautions  None      Restrictions   Weight Bearing Restrictions  No      Balance Screen   Has the patient fallen in the past 6 months  No      AROM   Right Shoulder External Rotation  30 Degrees   in abduction    Left Shoulder Flexion  130 Degrees    Left  Shoulder ABduction  90 Degrees    Left Shoulder Internal Rotation  55 Degrees    Left Shoulder External Rotation  50 Degrees      Strength   Right Shoulder Flexion  4+/5    Right Shoulder Internal Rotation  4+/5    Right Shoulder External Rotation  4+/5         [facility] Adult PT Treatment/Exercise - 04/05/19 0001      Shoulder Exercises: Standing   Flexion  AAROM;Strengthening;Both;10 reps    Shoulder Flexion Weight (lbs)  cane back on wall     Flexion Limitations  2 lbs flexion, punch x 2x 10     ABduction  Strengthening;Both;10 reps    Theraband Level (Shoulder ABduction)  Level 2 (Red)    Shoulder ABduction Weight (lbs)  red x2 sets behind back     Retraction  Strengthening;Both;10 reps    Shoulder Elevation Limitations  lat pulls 1 UE red band x 10     Other Standing Exercises  extension with cane     Other Standing Exercises  cane slides up back x 10 (ext, IR)       Shoulder Exercises: ROM/Strengthening   UBE (Upper Arm Bike)  3 min Forward, 3 min back       Shoulder Exercises: Stretch   Wall Stretch - Flexion  10 seconds    Wall Stretch - Flexion Limitations  x 10     Table Stretch - Abduction  5 reps;20 seconds    Table Stretch - ABduction Limitations  elbow bent     Table Stretch - External Rotation  5 reps;20 seconds    Other Shoulder Stretches  stretch out strap for distraction in door, multiple planes       Moist Heat Therapy   Number Minutes Moist Heat  10 Minutes    Moist Heat Location  Shoulder      Manual Therapy   Passive ROM  Rt UE all planes              PT Education - 04/05/19 1130    Education Details  renewal, POC    Person(s) Educated  Patient    Methods  Explanation    Comprehension  Verbalized understanding       PT Short Term Goals - 04/05/19 1104      PT SHORT TERM GOAL #1   Title  Pt will be I with initial HEP    Status  Achieved      PT SHORT TERM GOAL #2   Title  Pt will be able to raise arm to shoulder height (flexion  and  abduction) , 100 deg with pain no more than 4/10 most of the time for ADLs, housework    Status  Achieved      PT SHORT TERM GOAL #3   Title  Pt will be able to use Rt arm to chop, cook and at waist height without increased pain    Status  Achieved        PT Long Term Goals - 04/05/19 1105      PT LONG TERM GOAL #1   Title  Pt will demonstrate full AROM of Rt UE within 20 deg of Lt UE for full mobility of upper body    Baseline  RT shoulder flexion 135     abduction  130          ER   55 degrees in 45 degrees of abduction             IR  70 degrees    Status  On-going      PT LONG TERM GOAL #2   Title  Pt will be able to sleep without interruption from pain most nights of the week.    Baseline  sometimes pain interrupts    Status  Partially Met      PT LONG TERM GOAL #3   Title  Pt will be able to ;get dressed, washed and do light home tasks without limitation from pain    Status  Achieved      PT LONG TERM GOAL #4   Title  Pt will demo Rt UE strength to 4/5 or more for maximal shoulder function.    Baseline  met in supine      PT LONG TERM GOAL #5   Title  Pt will be I with HEP upon discharge    Status  On-going      PT LONG TERM GOAL #6   Title  Pt will score FOTO limited no more than 35%    Baseline  FOTO:45 % limitation 03/25/2019    Status  On-going            Plan - 04/05/19 1106    Clinical Impression Statement  Pt improving but MD recommending a few more weeks of PT in order to avoid surgery.  She has near full PROM but pain stops therapist from doing. Consistent with HEP at home.  Strength improved to 4+/5 throughout.  Her ROM improves as the day goes on.    PT Treatment/Interventions  ADLs/Self Care Home Management;Iontophoresis 33m/ml Dexamethasone;Passive range of motion;Ultrasound;Cryotherapy;Electrical Stimulation;Therapeutic exercise;Therapeutic activities;Patient/family education;Manual techniques;Taping;Moist Heat;Functional mobility training    PT  Next Visit Plan  ROM, strength, modalities for pain. Try closed chain (quad, wall), assess ionto    PT Home Exercise Plan  supine cane, table slides, red band ER/IR and horiz abd unattached, sleeper and corner stretch, wall slides flexion and abduction(scaption), wall slides    Consulted and Agree with Plan of Care  Patient       Patient will benefit from skilled therapeutic intervention in order to improve the following deficits and impairments:     Visit Diagnosis: Muscle weakness (generalized)  Acute pain of right shoulder  Stiffness of right shoulder, not elsewhere classified     Problem List Patient Active Problem List   Diagnosis Date Noted  . Bursitis of right shoulder 09/19/2018  . Right calf pain 05/18/2018  . Arthralgia 01/19/2018  . Grief reaction 02/24/2017  . Encounter for screening mammogram for breast cancer 12/02/2016  .  Chronic pancreatitis ([place]) 08/31/2016  . Colon cancer screening 05/23/2016  . Opioid dependence ([place]) 04/28/2016  . Sickle cell trait ([place]) 11/16/2011  . Seizure disorder (history after stroke last sz late 1990s) 11/16/2011  . Beta thalassemia ([place]) 11/16/2011  . History of asthma 11/16/2011  . Iron deficiency anemia 11/16/2011  . Chronic abdominal pain   . Preventative health care 07/20/2010  . GERD 08/03/2007  . Congenital anomaly of pancreas 02/28/2006  . Hyperlipidemia 02/06/2006  . Essential hypertension 02/06/2006  . Diabetes type 2, controlled ([place]) 02/07/1992    Robi Mitter 04/05/2019, 11:33 AM  [facility] [address] [place], [place], [address] Phone: [phone]   Fax:  [phone]  Name: Fanta Wimberley MRN: [id number] Date of Birth: 1956/01/04  Raeford Razor, PT 04/05/19 11:33 AM Phone: [phone] Fax: [phone]

## 2019-04-08 ENCOUNTER — Ambulatory Visit (INDEPENDENT_AMBULATORY_CARE_PROVIDER_SITE_OTHER): Payer: Medicare Other | Admitting: Internal Medicine

## 2019-04-08 ENCOUNTER — Other Ambulatory Visit: Payer: Self-pay

## 2019-04-08 ENCOUNTER — Encounter: Payer: Self-pay | Admitting: Internal Medicine

## 2019-04-08 VITALS — BP 121/68 | HR 98 | Temp 98.8°F | Ht 60.0 in | Wt 111.2 lb

## 2019-04-08 DIAGNOSIS — E119 Type 2 diabetes mellitus without complications: Secondary | ICD-10-CM | POA: Diagnosis not present

## 2019-04-08 DIAGNOSIS — Z79899 Other long term (current) drug therapy: Secondary | ICD-10-CM

## 2019-04-08 DIAGNOSIS — Z13820 Encounter for screening for osteoporosis: Secondary | ICD-10-CM

## 2019-04-08 DIAGNOSIS — K219 Gastro-esophageal reflux disease without esophagitis: Secondary | ICD-10-CM

## 2019-04-08 DIAGNOSIS — E785 Hyperlipidemia, unspecified: Secondary | ICD-10-CM

## 2019-04-08 DIAGNOSIS — Z1231 Encounter for screening mammogram for malignant neoplasm of breast: Secondary | ICD-10-CM

## 2019-04-08 DIAGNOSIS — Z7984 Long term (current) use of oral hypoglycemic drugs: Secondary | ICD-10-CM

## 2019-04-08 DIAGNOSIS — K861 Other chronic pancreatitis: Secondary | ICD-10-CM | POA: Diagnosis not present

## 2019-04-08 DIAGNOSIS — M7501 Adhesive capsulitis of right shoulder: Secondary | ICD-10-CM

## 2019-04-08 DIAGNOSIS — Z23 Encounter for immunization: Secondary | ICD-10-CM

## 2019-04-08 DIAGNOSIS — I10 Essential (primary) hypertension: Secondary | ICD-10-CM

## 2019-04-08 DIAGNOSIS — Z0000 Encounter for general adult medical examination without abnormal findings: Secondary | ICD-10-CM

## 2019-04-08 LAB — GLUCOSE, CAPILLARY: Glucose-Capillary: 105 mg/dL — ABNORMAL HIGH (ref 70–99)

## 2019-04-08 LAB — POCT GLYCOSYLATED HEMOGLOBIN (HGB A1C): Hemoglobin A1C: 7 % — AB (ref 4.0–5.6)

## 2019-04-08 NOTE — Assessment & Plan Note (Signed)
Refill sent for Omeprazole by our office and Protonix by GI at [facility]. She has been taking protonix only, will update medications - Protonix 40mg  Daily

## 2019-04-08 NOTE — Assessment & Plan Note (Signed)
Patient follows with GI at [facility]. She has been taking her Creon less due to cost and is currently holding it at this time as her symptoms have remained controlled. She has discussed this with GI who are okay with her holding the medication. - Creon intermittently - PRN Phenergan - GI follow up

## 2019-04-08 NOTE — Patient Instructions (Addendum)
Thank you for allowing [name] to care for you  For your pancreatitis and reflux - You can cont creon if symptoms remain controlled - Continue reflux medications - Continue to follow up with GI   For your Diabetes - A1c today 7.0 - Continue Metformin - Schedule eye exam  For your high blood pressure - Continue current medications  Pneumonia vaccine given, Mammogram and DEXA (Bone density test) orderd  Please follow up in about 6 months.

## 2019-04-08 NOTE — Assessment & Plan Note (Addendum)
A1c 7.0 today, DM remains well controlled. Will continue current regimen. - Metformin 1000mg  BID - Lifestyle modifications - Patient to schedule diabetic eye exam - Foot exam today

## 2019-04-08 NOTE — Progress Notes (Signed)
CC: Chronic Pancreatitis, GERD, Diabetes, Hypertension, Right shoulder adhesive capsulitis  HPI:  [name] is a 63 y.o. F with PMHx listed below presenting for Chronic Pancreatitis, GERD, Diabetes, Hypertension, Right shoulder adhesive capsulitis. Please see the A&P for the status of the patient's chronic medical problems.  Past Medical History:  Diagnosis Date  . Beta thalassemia ([place])   . Blood dyscrasia    thalasemia, sickle cell trait  . Childhood asthma   . Chronic abdominal pain    Unclear etiology, thought to be due to chronic pancreatitis previously in setting of her pancreatic divisum - however, EUS and EGD performed at [place]  (10/05/2011) showing normal esophageall, gastric, duodenal mucosa. EUS showing no pancreatic masses, cysts, or changes of chronic pancreatitis. Biliary system nondilated and had no endosonographic abnormalities.  . Colon cancer screening 05/23/2016  . CVA (cerebral vascular accident) [facility]) 1993   Per report by patient she had a stroke and prolonged rehab course; still "weak on left side but not much" (03/29/2017)  . Encounter for screening mammogram for breast cancer 12/02/2016  . Family history of adverse reaction to anesthesia    "mom took a long while to wake up; she was allergic to it" (03/29/2017)  . History of blood transfusion 1980s   "related to minor sickle cell crisis" (03/29/2017)  . HLD (hyperlipidemia) 2009  . Hypertension   . Iron deficiency anemia 11/16/2011  . Migraine headache    "q couple years now" (03/29/2017)  . Palpitation 07/18/2016  . Pancreatic divisum    S/P ERCP with stenting 07/2010 at [facility], then stent removal (08/05/2010)  . Pneumonia    "had it in my teens 3 times" (03/29/2017)  . Pulmonary embolus ([place]) 08/2004   Two areas of V/Q mismatch. Findings compatible  with high probability for pulmonary embolus.; pt was on coumadin for 1 year  . Right calf pain 05/18/2018  . Seizures ([place]) 1993   "after  the stroke in 1993; none for years now"  (03/29/2017)  . Sickle cell trait ([place])   . Thalassemia   . Type II diabetes mellitus ([place]) 2010   well controlled  . Uterine cancer ([place])    Review of Systems: Performed and all others negative.  Physical Exam:  Vitals:   04/08/19 0917 04/08/19 0919  BP:  121/68  Pulse:  98  Temp:  98.8 F (37.1 C)  TempSrc:  Oral  SpO2:  99%  Weight: 111 lb 3.2 oz (50.4 kg)   Height: 5' (1.524 m)    Physical Exam Constitutional:      General: She is not in acute distress.    Appearance: Normal appearance.  Cardiovascular:     Rate and Rhythm: Normal rate and regular rhythm.     Pulses: Normal pulses.     Heart sounds: Normal heart sounds.  Pulmonary:     Effort: Pulmonary effort is normal. No respiratory distress.     Breath sounds: Normal breath sounds.  Abdominal:     General: Bowel sounds are normal. There is no distension.     Palpations: Abdomen is soft.     Tenderness: There is no abdominal tenderness.  Musculoskeletal:        General: No swelling or deformity.  Skin:    General: Skin is warm and dry.  Neurological:     General: No focal deficit present.     Mental Status: Mental status is at baseline.    Assessment & Plan:   See Encounters Tab for  problem based charting.  Patient discussed with Dr. Heber [place]

## 2019-04-08 NOTE — Assessment & Plan Note (Signed)
-   PNA 13 given - Dexa and Mammogram ordered

## 2019-04-08 NOTE — Assessment & Plan Note (Signed)
BP remains well controlled today at 121/68, will continue current medication. - Lisinopril-HCTZ 20-12.5mg  Daily

## 2019-04-08 NOTE — Assessment & Plan Note (Signed)
Patient was diagnosed with adhesive capsulitis at her Ortho visit and has been following with them and PT. She has made progress with PT, but her therapy has been extended through January. - Continue to follow up with Ortho and PT

## 2019-04-09 ENCOUNTER — Ambulatory Visit: Payer: Medicare Other | Admitting: Physical Therapy

## 2019-04-10 ENCOUNTER — Ambulatory Visit: Payer: Medicare Other | Admitting: Physical Therapy

## 2019-04-10 ENCOUNTER — Encounter: Payer: Self-pay | Admitting: Physical Therapy

## 2019-04-10 ENCOUNTER — Other Ambulatory Visit: Payer: Self-pay

## 2019-04-10 DIAGNOSIS — M6281 Muscle weakness (generalized): Secondary | ICD-10-CM

## 2019-04-10 DIAGNOSIS — M25511 Pain in right shoulder: Secondary | ICD-10-CM

## 2019-04-10 DIAGNOSIS — M25611 Stiffness of right shoulder, not elsewhere classified: Secondary | ICD-10-CM

## 2019-04-10 NOTE — Progress Notes (Signed)
Internal Medicine Clinic Attending  Case discussed with Dr. Melvin  at the time of the visit.  We reviewed the resident's history and exam and pertinent patient test results.  I agree with the assessment, diagnosis, and plan of care documented in the resident's note.  

## 2019-04-10 NOTE — Therapy (Signed)
[place], [place], [address] Phone: [phone]   Fax:  [phone]  Physical Therapy Treatment  Patient Details  Name: [name] MRN: [id number] Date of Birth: [birth year] Referring Provider (PT): Dr. Eunice Blase   Encounter Date: 04/10/2019  PT End of Session - 04/10/19 1105    Visit Number  22    Number of Visits  29    Date for PT Re-Evaluation  05/03/19    Authorization Type  N/A    PT Start Time  1101    PT Stop Time  1149    PT Time Calculation (min)  48 min       Past Medical History:  Diagnosis Date  . Beta thalassemia ([place])   . Blood dyscrasia    thalasemia, sickle cell trait  . Childhood asthma   . Chronic abdominal pain    Unclear etiology, thought to be due to chronic pancreatitis previously in setting of her pancreatic divisum - however, EUS and EGD performed at [place]  (10/05/2011) showing normal esophageall, gastric, duodenal mucosa. EUS showing no pancreatic masses, cysts, or changes of chronic pancreatitis. Biliary system nondilated and had no endosonographic abnormalities.  . Colon cancer screening 05/23/2016  . CVA (cerebral vascular accident) [facility]) 1993   Per report by patient she had a stroke and prolonged rehab course; still "weak on left side but not much" (03/29/2017)  . Encounter for screening mammogram for breast cancer 12/02/2016  . Family history of adverse reaction to anesthesia    "mom took a long while to wake up; she was allergic to it" (03/29/2017)  . History of blood transfusion 1980s   "related to minor sickle cell crisis" (03/29/2017)  . HLD (hyperlipidemia) 2009  . Hypertension   . Iron deficiency anemia 11/16/2011  . Migraine headache    "q couple years now" (03/29/2017)  . Palpitation 07/18/2016  . Pancreatic divisum    S/P ERCP with stenting 07/2010 at [facility], then stent removal (08/05/2010)  . Pneumonia    "had it in my teens 3 times"  (03/29/2017)  . Pulmonary embolus ([place]) 08/2004   Two areas of V/Q mismatch. Findings compatible  with high probability for pulmonary embolus.; pt was on coumadin for 1 year  . Right calf pain 05/18/2018  . Seizures ([place]) 1993   "after the stroke in 1993; none for years now"  (03/29/2017)  . Sickle cell trait ([place])   . Thalassemia   . Type II diabetes mellitus ([place]) 2010   well controlled  . Uterine cancer [facility])     Past Surgical History:  Procedure Laterality Date  . ABDOMINAL HYSTERECTOMY  1980   2/2 endometriosis  . APPENDECTOMY  ?1980   "I think I've had it out" (11/20/2012)  . BREAST LUMPECTOMY Bilateral 1980's   "in my milk ducts; both benign" (02/27/2012)  . CORONARY ANGIOGRAM Bilateral 03/17/2011   Procedure: CORONARY ANGIOGRAM;  Surgeon: Jettie Booze, MD;  Location: [facility] CATH LAB;  Service: Cardiovascular;  Laterality: Bilateral;  . ERCP  07/2010   w/stent placement  at [facility], then stent removal (08/05/2010)  . ESOPHAGOGASTRODUODENOSCOPY N/A 10/26/2013   Procedure: ESOPHAGOGASTRODUODENOSCOPY (EGD);  Surgeon: Jerene Bears, MD;  Location: [facility] ENDOSCOPY;  Service: Endoscopy;  Laterality: N/A;  . Pancreatic stent placement/removal     placed in 2011; removed in 2012/H&P (02/27/2012); "I've had several" (03/29/2017)  . SPHINCTEROTOMY     [facility] 11/20/2012    There were no vitals filed for this  visit.  Subjective Assessment - 04/10/19 1104    Subjective  I got a pneumonia shot in then right arm and it was sore.    Currently in Pain?  No/denies                       [facility] Adult PT Treatment/Exercise - 04/10/19 0001      Shoulder Exercises: Standing   External Rotation  20 reps    Theraband Level (Shoulder External Rotation)  Level 3 (Green)    Internal Rotation  Strengthening;Right;20 reps    Theraband Level (Shoulder Internal Rotation)  Level 3 (Green)    Flexion  Strengthening;Both;10 reps    Flexion Limitations  rockwood punch green band x 20      Extension  20 reps    Theraband Level (Shoulder Extension)  Level 3 (Green)    Row  20 reps    Theraband Level (Shoulder Row)  Level 3 (Green)    Other Standing Exercises  2# cabinet reaching up to middle shelf, 2 # forward and lateral raise, 4# over head press using bilat UE      Shoulder Exercises: ROM/Strengthening   UBE (Upper Arm Bike)  3 min Forward, 3 min back , Level 1.5 mph      Shoulder Exercises: Stretch   Internal Rotation Stretch Limitations  towel in axiall , self mob followed by AAROM    Table Stretch - Flexion  5 reps    Table Stretch - Abduction  5 reps      Moist Heat Therapy   Number Minutes Moist Heat  10 Minutes    Moist Heat Location  Shoulder      Manual Therapy   Passive ROM  Rt UE all planes                PT Short Term Goals - 04/05/19 1104      PT SHORT TERM GOAL #1   Title  Pt will be I with initial HEP    Status  Achieved      PT SHORT TERM GOAL #2   Title  Pt will be able to raise arm to shoulder height (flexion and abduction) , 100 deg with pain no more than 4/10 most of the time for ADLs, housework    Status  Achieved      PT SHORT TERM GOAL #3   Title  Pt will be able to use Rt arm to chop, cook and at waist height without increased pain    Status  Achieved        PT Long Term Goals - 04/05/19 1105      PT LONG TERM GOAL #1   Title  Pt will demonstrate full AROM of Rt UE within 20 deg of Lt UE for full mobility of upper body    Baseline  RT shoulder flexion 135     abduction  130          ER   55 degrees in 45 degrees of abduction             IR  70 degrees    Status  On-going      PT LONG TERM GOAL #2   Title  Pt will be able to sleep without interruption from pain most nights of the week.    Baseline  sometimes pain interrupts    Status  Partially Met      PT LONG TERM GOAL #3  Title  Pt will be able to ;get dressed, washed and do light home tasks without limitation from pain    Status  Achieved      PT LONG TERM  GOAL #4   Title  Pt will demo Rt UE strength to 4/5 or more for maximal shoulder function.    Baseline  met in supine      PT LONG TERM GOAL #5   Title  Pt will be I with HEP upon discharge    Status  On-going      PT LONG TERM GOAL #6   Title  Pt will score FOTO limited no more than 35%    Baseline  FOTO:45 % limitation 03/25/2019    Status  On-going            Plan - 04/10/19 1217    Clinical Impression Statement  Pt reports soreness due to pneumonia shot in RUE. Continued with shoulder strengthening and ROM. Good enduance with strengthening. Functional reaching improving.    PT Next Visit Plan  functional strength, try wall/quadruped/    PT Home Exercise Plan  supine cane, table slides, red band ER/IR and horiz abd unattached, sleeper and corner stretch, wall slides flexion and abduction(scaption), wall slides       Patient will benefit from skilled therapeutic intervention in order to improve the following deficits and impairments:  Pain, Decreased activity tolerance, Decreased range of motion, Decreased strength, Impaired UE functional use, Impaired flexibility, Decreased mobility  Visit Diagnosis: Muscle weakness (generalized)  Acute pain of right shoulder  Stiffness of right shoulder, not elsewhere classified     Problem List Patient Active Problem List   Diagnosis Date Noted  . Adhesive capsulitis of right shoulder 09/19/2018  . Arthralgia 01/19/2018  . Grief reaction 02/24/2017  . Chronic pancreatitis ([place]) 08/31/2016  . Opioid dependence ([place]) 04/28/2016  . Sickle cell trait ([place]) 11/16/2011  . Seizure disorder (history after stroke last sz late 1990s) 11/16/2011  . Beta thalassemia ([place]) 11/16/2011  . History of asthma 11/16/2011  . Chronic abdominal pain   . Preventative health care 07/20/2010  . GERD 08/03/2007  . Congenital anomaly of pancreas 02/28/2006  . Hyperlipidemia 02/06/2006  . Essential hypertension 02/06/2006  . Diabetes type 2,  controlled ([place]) 02/07/1992    Dorene Ar, PTA 04/10/2019, 12:19 PM  [place], [place], [address] Phone: [phone]   Fax:  [phone]  Name: [name] MRN: [id number] Date of Birth: Dec 12, 1955

## 2019-04-15 ENCOUNTER — Other Ambulatory Visit: Payer: Self-pay

## 2019-04-15 ENCOUNTER — Ambulatory Visit: Payer: Medicare Other | Admitting: Physical Therapy

## 2019-04-15 ENCOUNTER — Encounter: Payer: Self-pay | Admitting: Physical Therapy

## 2019-04-15 DIAGNOSIS — M25511 Pain in right shoulder: Secondary | ICD-10-CM

## 2019-04-15 DIAGNOSIS — M6281 Muscle weakness (generalized): Secondary | ICD-10-CM

## 2019-04-15 DIAGNOSIS — M25611 Stiffness of right shoulder, not elsewhere classified: Secondary | ICD-10-CM

## 2019-04-15 NOTE — Therapy (Signed)
[place], [place], [address] Phone: [phone]   Fax:  [phone]  Physical Therapy Treatment  Patient Details  Name: [name] MRN: [id number] Date of Birth: Oct 13, 1955 Referring Provider (PT): Dr. Eunice Blase   Encounter Date: 04/15/2019  PT End of Session - 04/15/19 0808    Visit Number  23    Number of Visits  29    Date for PT Re-Evaluation  05/03/19    PT Start Time  0803    PT Stop Time  1607    PT Time Calculation (min)  52 min    Activity Tolerance  Patient tolerated treatment well    Behavior During Therapy  [facility] for tasks assessed/performed       Past Medical History:  Diagnosis Date  . Beta thalassemia ([place])   . Blood dyscrasia    thalasemia, sickle cell trait  . Childhood asthma   . Chronic abdominal pain    Unclear etiology, thought to be due to chronic pancreatitis previously in setting of her pancreatic divisum - however, EUS and EGD performed at [place]  (10/05/2011) showing normal esophageall, gastric, duodenal mucosa. EUS showing no pancreatic masses, cysts, or changes of chronic pancreatitis. Biliary system nondilated and had no endosonographic abnormalities.  . Colon cancer screening 05/23/2016  . CVA (cerebral vascular accident) [facility]) 1993   Per report by patient she had a stroke and prolonged rehab course; still "weak on left side but not much" (03/29/2017)  . Encounter for screening mammogram for breast cancer 12/02/2016  . Family history of adverse reaction to anesthesia    "mom took a long while to wake up; she was allergic to it" (03/29/2017)  . History of blood transfusion 1980s   "related to minor sickle cell crisis" (03/29/2017)  . HLD (hyperlipidemia) 2009  . Hypertension   . Iron deficiency anemia 11/16/2011  . Migraine headache    "q couple years now" (03/29/2017)  . Palpitation 07/18/2016  . Pancreatic divisum    S/P ERCP with stenting 07/2010 at  [facility], then stent removal (08/05/2010)  . Pneumonia    "had it in my teens 3 times" (03/29/2017)  . Pulmonary embolus ([place]) 08/2004   Two areas of V/Q mismatch. Findings compatible  with high probability for pulmonary embolus.; pt was on coumadin for 1 year  . Right calf pain 05/18/2018  . Seizures ([place]) 1993   "after the stroke in 1993; none for years now"  (03/29/2017)  . Sickle cell trait ([place])   . Thalassemia   . Type II diabetes mellitus ([place]) 2010   well controlled  . Uterine cancer [facility])     Past Surgical History:  Procedure Laterality Date  . ABDOMINAL HYSTERECTOMY  1980   2/2 endometriosis  . APPENDECTOMY  ?1980   "I think I've had it out" (11/20/2012)  . BREAST LUMPECTOMY Bilateral 1980's   "in my milk ducts; both benign" (02/27/2012)  . CORONARY ANGIOGRAM Bilateral 03/17/2011   Procedure: CORONARY ANGIOGRAM;  Surgeon: Jettie Booze, MD;  Location: [facility] CATH LAB;  Service: Cardiovascular;  Laterality: Bilateral;  . ERCP  07/2010   w/stent placement  at [facility], then stent removal (08/05/2010)  . ESOPHAGOGASTRODUODENOSCOPY N/A 10/26/2013   Procedure: ESOPHAGOGASTRODUODENOSCOPY (EGD);  Surgeon: Jerene Bears, MD;  Location: [facility] ENDOSCOPY;  Service: Endoscopy;  Laterality: N/A;  . Pancreatic stent placement/removal     placed in 2011; removed in 2012/H&P (02/27/2012); "I've had several" (03/29/2017)  . SPHINCTEROTOMY     /  notes 11/20/2012    There were no vitals filed for this visit.  Subjective Assessment - 04/15/19 0807    Subjective  Pt arriving to therapy reporting no pain only stiffness.    Pertinent History  diabetes, CVA, sickle cell trait, pancreatits    Limitations  Lifting;Writing;House hold activities    Patient Stated Goals  Patient would like to be able to use her arm , less pain, sleep better    Currently in Pain?  No/denies                       [facility] Adult PT Treatment/Exercise - 04/15/19 0001      Shoulder Exercises: Standing    External Rotation  20 reps    Theraband Level (Shoulder External Rotation)  Level 3 (Green)    Internal Rotation  Strengthening;Right;20 reps    Theraband Level (Shoulder Internal Rotation)  Level 3 (Green)    Flexion  Strengthening;Both;10 reps    Flexion Limitations  rockwood punch green band x 20     Extension  20 reps    Theraband Level (Shoulder Extension)  Level 3 (Green)    Row  20 reps    Theraband Level (Shoulder Row)  Level 3 (Green)    Other Standing Exercises  3# lifting from counter to middle shelf x 15 reps      Shoulder Exercises: ROM/Strengthening   UBE (Upper Arm Bike)  3 min Forward, 3 min back , Level 2 mph      Shoulder Exercises: Stretch   Table Stretch - Flexion  5 reps    Table Stretch - Abduction  5 reps      Moist Heat Therapy   Number Minutes Moist Heat  10 Minutes    Moist Heat Location  Shoulder      Manual Therapy   Passive ROM  Rt UE all planes              PT Education - 04/15/19 0808    Education Details  exercise techniques    Person(s) Educated  Patient    Methods  Explanation;Demonstration    Comprehension  Verbalized understanding;Returned demonstration       PT Short Term Goals - 04/15/19 0809      PT SHORT TERM GOAL #1   Title  Pt will be I with initial HEP    Baseline  pt reports doing them as much as she can with no questions    Time  4    Period  Weeks    Status  Achieved      PT SHORT TERM GOAL #2   Title  Pt will be able to raise arm to shoulder height (flexion and abduction) , 100 deg with pain no more than 4/10 most of the time for ADLs, housework    Baseline  able to flex > 135 , 130 abdct no pain    Time  4    Period  Weeks    Status  Achieved    Target Date  03/04/19      PT SHORT TERM GOAL #3   Title  Pt will be able to use Rt arm to chop, cook and at waist height without increased pain    Baseline  much improved, some difficulty with large hard items to chop, improved abiltiy to use caneopener    Time  4     Period  Weeks    Status  Achieved    Target  Date  03/04/19        PT Long Term Goals - 04/15/19 0809      PT LONG TERM GOAL #1   Title  Pt will demonstrate full AROM of Rt UE within 20 deg of Lt UE for full mobility of upper body    Baseline  RT shoulder flexion 135     abduction  130          ER   55 degrees in 45 degrees of abduction             IR  70 degrees (04/08/2019)    Time  8    Period  Weeks    Status  On-going      PT LONG TERM GOAL #2   Title  Pt will be able to sleep without interruption from pain most nights of the week.    Baseline  sometimes pain interrupts    Time  8    Period  Weeks    Status  Partially Met      PT LONG TERM GOAL #3   Title  Pt will be able to ;get dressed, washed and do light home tasks without limitation from pain    Baseline  better, modifies things but is able to complete dressing and showering using the wall in shower to assist hoding arm up    Time  8    Status  Achieved      PT LONG TERM GOAL #4   Title  Pt will demo Rt UE strength to 4/5 or more for maximal shoulder function.    Baseline  met in supine    Time  8    Period  Weeks      PT LONG TERM GOAL #5   Title  Pt will be I with HEP upon discharge    Time  8    Period  Weeks            Plan - 04/15/19 5427    Clinical Impression Statement  Pt making progress with strengthening and endurace, still needs to progress toward LTG's and PLOF. Continue skilled PT.    Personal Factors and Comorbidities  Comorbidity 1;Comorbidity 2    Comorbidities  diabetes, pancreatitis    Examination-Activity Limitations  Lift;Bed Mobility;Transfers;Reach Overhead;Hygiene/Grooming;Dressing    Examination-Participation Restrictions  Laundry;Community Activity;Yard Work;Cleaning;Meal Prep    Stability/Clinical Decision Making  Evolving/Moderate complexity    Rehab Potential  Excellent    PT Frequency  2x / week    PT Duration  4 weeks    PT Treatment/Interventions  ADLs/Self Care Home  Management;Iontophoresis '4mg'$ /ml Dexamethasone;Passive range of motion;Ultrasound;Cryotherapy;Electrical Stimulation;Therapeutic exercise;Therapeutic activities;Patient/family education;Manual techniques;Taping;Moist Heat;Functional mobility training    PT Next Visit Plan  functional strength, try wall/quadruped/    PT Home Exercise Plan  supine cane, table slides, red band ER/IR and horiz abd unattached, sleeper and corner stretch, wall slides flexion and abduction(scaption), wall slides    Consulted and Agree with Plan of Care  Patient       Patient will benefit from skilled therapeutic intervention in order to improve the following deficits and impairments:  Pain, Decreased activity tolerance, Decreased range of motion, Decreased strength, Impaired UE functional use, Impaired flexibility, Decreased mobility  Visit Diagnosis: Muscle weakness (generalized)  Stiffness of right shoulder, not elsewhere classified  Acute pain of right shoulder     Problem List Patient Active Problem List   Diagnosis Date Noted  . Adhesive capsulitis of right shoulder 09/19/2018  .  Arthralgia 01/19/2018  . Grief reaction 02/24/2017  . Chronic pancreatitis ([place]) 08/31/2016  . Opioid dependence ([place]) 04/28/2016  . Sickle cell trait ([place]) 11/16/2011  . Seizure disorder (history after stroke last sz late 1990s) 11/16/2011  . Beta thalassemia ([place]) 11/16/2011  . History of asthma 11/16/2011  . Chronic abdominal pain   . Preventative health care 07/20/2010  . GERD 08/03/2007  . Congenital anomaly of pancreas 02/28/2006  . Hyperlipidemia 02/06/2006  . Essential hypertension 02/06/2006  . Diabetes type 2, controlled ([place]) 02/07/1992    Oretha Caprice, PT 04/15/2019, 8:18 AM  [facility] [address] [place], [place], [address] Phone: [phone]   Fax:  [phone]  Name: [name] MRN: [id number] Date of Birth: 1955/07/02

## 2019-04-17 ENCOUNTER — Encounter: Payer: Self-pay | Admitting: Physical Therapy

## 2019-04-17 ENCOUNTER — Ambulatory Visit: Payer: Medicare Other | Admitting: Physical Therapy

## 2019-04-17 ENCOUNTER — Other Ambulatory Visit: Payer: Self-pay

## 2019-04-17 DIAGNOSIS — M25511 Pain in right shoulder: Secondary | ICD-10-CM

## 2019-04-17 DIAGNOSIS — M6281 Muscle weakness (generalized): Secondary | ICD-10-CM

## 2019-04-17 DIAGNOSIS — M25611 Stiffness of right shoulder, not elsewhere classified: Secondary | ICD-10-CM

## 2019-04-17 NOTE — Therapy (Signed)
[place], [place], [address] Phone: [phone]   Fax:  [phone]  Physical Therapy Treatment  Patient Details  Name: [name] MRN: [id number] Date of Birth: 1956-04-16 Referring Provider (PT): Dr. Eunice Blase   Encounter Date: 04/17/2019  PT End of Session - 04/17/19 0852    Visit Number  24    Number of Visits  29    Date for PT Re-Evaluation  05/03/19    Authorization Type  N/A    PT Start Time  0845    PT Stop Time  0935    PT Time Calculation (min)  50 min    Activity Tolerance  Patient tolerated treatment well    Behavior During Therapy  [facility] for tasks assessed/performed       Past Medical History:  Diagnosis Date  . Beta thalassemia ([place])   . Blood dyscrasia    thalasemia, sickle cell trait  . Childhood asthma   . Chronic abdominal pain    Unclear etiology, thought to be due to chronic pancreatitis previously in setting of her pancreatic divisum - however, EUS and EGD performed at [place]  (10/05/2011) showing normal esophageall, gastric, duodenal mucosa. EUS showing no pancreatic masses, cysts, or changes of chronic pancreatitis. Biliary system nondilated and had no endosonographic abnormalities.  . Colon cancer screening 05/23/2016  . CVA (cerebral vascular accident) [facility]) 1993   Per report by patient she had a stroke and prolonged rehab course; still "weak on left side but not much" (03/29/2017)  . Encounter for screening mammogram for breast cancer 12/02/2016  . Family history of adverse reaction to anesthesia    "mom took a long while to wake up; she was allergic to it" (03/29/2017)  . History of blood transfusion 1980s   "related to minor sickle cell crisis" (03/29/2017)  . HLD (hyperlipidemia) 2009  . Hypertension   . Iron deficiency anemia 11/16/2011  . Migraine headache    "q couple years now" (03/29/2017)  . Palpitation 07/18/2016  . Pancreatic divisum    S/P  ERCP with stenting 07/2010 at [facility], then stent removal (08/05/2010)  . Pneumonia    "had it in my teens 3 times" (03/29/2017)  . Pulmonary embolus ([place]) 08/2004   Two areas of V/Q mismatch. Findings compatible  with high probability for pulmonary embolus.; pt was on coumadin for 1 year  . Right calf pain 05/18/2018  . Seizures ([place]) 1993   "after the stroke in 1993; none for years now"  (03/29/2017)  . Sickle cell trait ([place])   . Thalassemia   . Type II diabetes mellitus ([place]) 2010   well controlled  . Uterine cancer [facility])     Past Surgical History:  Procedure Laterality Date  . ABDOMINAL HYSTERECTOMY  1980   2/2 endometriosis  . APPENDECTOMY  ?1980   "I think I've had it out" (11/20/2012)  . BREAST LUMPECTOMY Bilateral 1980's   "in my milk ducts; both benign" (02/27/2012)  . CORONARY ANGIOGRAM Bilateral 03/17/2011   Procedure: CORONARY ANGIOGRAM;  Surgeon: Jettie Booze, MD;  Location: [facility] CATH LAB;  Service: Cardiovascular;  Laterality: Bilateral;  . ERCP  07/2010   w/stent placement  at [facility], then stent removal (08/05/2010)  . ESOPHAGOGASTRODUODENOSCOPY N/A 10/26/2013   Procedure: ESOPHAGOGASTRODUODENOSCOPY (EGD);  Surgeon: Jerene Bears, MD;  Location: [facility] ENDOSCOPY;  Service: Endoscopy;  Laterality: N/A;  . Pancreatic stent placement/removal     placed in 2011; removed in 2012/H&P (02/27/2012); "I've had  several" (03/29/2017)  . SPHINCTEROTOMY     Archie Endo 11/20/2012    There were no vitals filed for this visit.  Subjective Assessment - 04/17/19 0851    Subjective  Pt arrving to therpay reporting no pain today.    Pertinent History  diabetes, CVA, sickle cell trait, pancreatits    Limitations  Lifting;Writing;House hold activities    Patient Stated Goals  Patient would like to be able to use her arm , less pain, sleep better    Currently in Pain?  No/denies         [facility] PT Assessment - 04/17/19 0001      Assessment   Medical Diagnosis  R shoulder pain     Referring Provider (PT)  Dr. Legrand Como Hilts    Hand Dominance  Right    Prior Therapy  no      Precautions   Precautions  None      Restrictions   Weight Bearing Restrictions  No      AROM   Right Shoulder External Rotation  36 Degrees    Left Shoulder Flexion  150 Degrees    Left Shoulder ABduction  100 Degrees      PROM   Overall PROM Comments  ER: 40 degrees, Flexion: 158 degrees, Abd: 115 degrees                   [facility] Adult PT Treatment/Exercise - 04/17/19 0001      Shoulder Exercises: Standing   External Rotation  20 reps    Theraband Level (Shoulder External Rotation)  Level 3 (Green)    Internal Rotation  Strengthening;Right;20 reps    Theraband Level (Shoulder Internal Rotation)  Level 3 (Green)    Extension  20 reps    Theraband Level (Shoulder Extension)  Level 3 (Green)    Row  20 reps    Theraband Level (Shoulder Row)  Level 3 (Green)    Other Standing Exercises  rolling blue medicine ball up wall x 15 reps    Other Standing Exercises  4# lifting from counter height to 1st shelf x 15 reps, then to 2nd shelf x 15 reps      Shoulder Exercises: ROM/Strengthening   UBE (Upper Arm Bike)  3 min Forward, 3 min back , Level 2 mph      Shoulder Exercises: Stretch   Table Stretch - Flexion  5 reps    Table Stretch - Abduction  5 reps    Table Stretch - External Rotation Limitations  5 reps      Shoulder Exercises: Body Blade   Flexion  30 seconds;3 reps   vertical and horizontal positions     Moist Heat Therapy   Number Minutes Moist Heat  8 Minutes    Moist Heat Location  Shoulder      Manual Therapy   Passive ROM  Rt UE all planes                PT Short Term Goals - 04/15/19 0809      PT SHORT TERM GOAL #1   Title  Pt will be I with initial HEP    Baseline  pt reports doing them as much as she can with no questions    Time  4    Period  Weeks    Status  Achieved      PT SHORT TERM GOAL #2   Title  Pt will be able to raise arm  to shoulder height (  flexion and abduction) , 100 deg with pain no more than 4/10 most of the time for ADLs, housework    Baseline  able to flex > 135 , 130 abdct no pain    Time  4    Period  Weeks    Status  Achieved    Target Date  03/04/19      PT SHORT TERM GOAL #3   Title  Pt will be able to use Rt arm to chop, cook and at waist height without increased pain    Baseline  much improved, some difficulty with large hard items to chop, improved abiltiy to use caneopener    Time  4    Period  Weeks    Status  Achieved    Target Date  03/04/19        PT Long Term Goals - 04/17/19 3846      PT LONG TERM GOAL #1   Title  Pt will demonstrate full AROM of Rt UE within 20 deg of Lt UE for full mobility of upper body    Baseline  RT shoulder flexion 135     abduction  130          ER   55 degrees in 45 degrees of abduction             IR  70 degrees (04/08/2019)    Time  8    Period  Weeks    Status  On-going      PT LONG TERM GOAL #2   Title  Pt will be able to sleep without interruption from pain most nights of the week.    Baseline  sometimes pain interrupts    Status  Partially Met      PT LONG TERM GOAL #3   Title  Pt will be able to ;get dressed, washed and do light home tasks without limitation from pain    Time  8    Period  Weeks    Status  Achieved      PT LONG TERM GOAL #4   Title  Pt will demo Rt UE strength to 4/5 or more for maximal shoulder function.    Baseline  met in supine    Time  8    Period  Weeks    Status  On-going      PT LONG TERM GOAL #5   Title  Pt will be I with HEP upon discharge    Time  8    Period  Weeks    Status  On-going      PT LONG TERM GOAL #6   Title  Pt will score FOTO limited no more than 35%    Baseline  FOTO:45 % limitation 03/25/2019    Time  8    Period  Weeks    Status  On-going            Plan - 04/17/19 0914    Clinical Impression Statement  Pt tolerating exercises with increased pain with rotational movements.  Pt also reporting increased fatigue with body blade exercises. AROM;158 degrees,  flexion, ER: 40 degrees.  Continue skilled PT to progress toward goals set.    Personal Factors and Comorbidities  Comorbidity 1;Comorbidity 2    Comorbidities  diabetes, pancreatitis    Examination-Activity Limitations  Lift;Bed Mobility;Transfers;Reach Overhead;Hygiene/Grooming;Dressing    Examination-Participation Restrictions  Laundry;Community Activity;Yard Work;Cleaning;Meal Prep    Stability/Clinical Decision Making  Evolving/Moderate complexity    Rehab Potential  Excellent    PT Frequency  2x / week    PT Duration  4 weeks    PT Treatment/Interventions  ADLs/Self Care Home Management;Iontophoresis 29m/ml Dexamethasone;Passive range of motion;Ultrasound;Cryotherapy;Electrical Stimulation;Therapeutic exercise;Therapeutic activities;Patient/family education;Manual techniques;Taping;Moist Heat;Functional mobility training    PT Next Visit Plan  functional strength, try wall/quadruped/    PT Home Exercise Plan  supine cane, table slides, red band ER/IR and horiz abd unattached, sleeper and corner stretch, wall slides flexion and abduction(scaption), wall slides    Consulted and Agree with Plan of Care  Patient       Patient will benefit from skilled therapeutic intervention in order to improve the following deficits and impairments:  Pain, Decreased activity tolerance, Decreased range of motion, Decreased strength, Impaired UE functional use, Impaired flexibility, Decreased mobility  Visit Diagnosis: Muscle weakness (generalized)  Stiffness of right shoulder, not elsewhere classified  Acute pain of right shoulder     Problem List Patient Active Problem List   Diagnosis Date Noted  . Adhesive capsulitis of right shoulder 09/19/2018  . Arthralgia 01/19/2018  . Grief reaction 02/24/2017  . Chronic pancreatitis ([place] 08/31/2016  . Opioid dependence ([place] 04/28/2016  . Sickle cell trait ([place]  11/16/2011  . Seizure disorder (history after stroke last sz late 1990s) 11/16/2011  . [place]semia ([place] 11/16/2011  . History of asthma 11/16/2011  . Chronic abdominal pain   . Preventative health care 07/20/2010  . GERD 08/03/2007  . Congenital anomaly of pancreas 02/28/2006  . Hyperlipidemia 02/06/2006  . Essential hypertension 02/06/2006  . Diabetes type 2, controlled ([place] 02/07/1992    JOretha Caprice PT 04/17/2019, 9:28 AM  [facility]GB[address] [place] [address]Phone: [phone]  Fax:  [phone] Name: AYalexa BlustMRN: [id number] of Birth: [birth year]

## 2019-04-29 ENCOUNTER — Other Ambulatory Visit: Payer: Self-pay

## 2019-04-29 ENCOUNTER — Ambulatory Visit: Payer: Medicare Other | Admitting: Physical Therapy

## 2019-04-29 ENCOUNTER — Encounter: Payer: Self-pay | Admitting: Physical Therapy

## 2019-04-29 DIAGNOSIS — M25511 Pain in right shoulder: Secondary | ICD-10-CM

## 2019-04-29 DIAGNOSIS — M6281 Muscle weakness (generalized): Secondary | ICD-10-CM

## 2019-04-29 DIAGNOSIS — M25611 Stiffness of right shoulder, not elsewhere classified: Secondary | ICD-10-CM

## 2019-04-29 NOTE — Therapy (Signed)
[place], [place], [address] Phone: [phone]   Fax:  [phone]  Physical Therapy Treatment  Patient Details  Name: [name] MRN: [id number] Date of Birth: 1955/10/25 Referring Provider (PT): Dr. Eunice Blase   Encounter Date: 04/29/2019  PT End of Session - 04/29/19 0857    Visit Number  25    Number of Visits  29    Date for PT Re-Evaluation  05/03/19    Authorization Type  N/A    PT Start Time  0845    PT Stop Time  0935    PT Time Calculation (min)  50 min       Past Medical History:  Diagnosis Date  . Beta thalassemia ([place])   . Blood dyscrasia    thalasemia, sickle cell trait  . Childhood asthma   . Chronic abdominal pain    Unclear etiology, thought to be due to chronic pancreatitis previously in setting of her pancreatic divisum - however, EUS and EGD performed at [facility]  (10/05/2011) showing normal esophageall, gastric, duodenal mucosa. EUS showing no pancreatic masses, cysts, or changes of chronic pancreatitis. Biliary system nondilated and had no endosonographic abnormalities.  . Colon cancer screening 05/23/2016  . CVA (cerebral vascular accident) [facility]) 1993   Per report by patient she had a stroke and prolonged rehab course; still "weak on left side but not much" (03/29/2017)  . Encounter for screening mammogram for breast cancer 12/02/2016  . Family history of adverse reaction to anesthesia    "mom took a long while to wake up; she was allergic to it" (03/29/2017)  . History of blood transfusion 1980s   "related to minor sickle cell crisis" (03/29/2017)  . HLD (hyperlipidemia) 2009  . Hypertension   . Iron deficiency anemia 11/16/2011  . Migraine headache    "q couple years now" (03/29/2017)  . Palpitation 07/18/2016  . Pancreatic divisum    S/P ERCP with stenting 07/2010 at [facility], then stent removal (08/05/2010)  . Pneumonia    "had it in my teens 3 times"  (03/29/2017)  . Pulmonary embolus ([place]) 08/2004   Two areas of V/Q mismatch. Findings compatible  with high probability for pulmonary embolus.; pt was on coumadin for 1 year  . Right calf pain 05/18/2018  . Seizures ([place]) 1993   "after the stroke in 1993; none for years now"  (03/29/2017)  . Sickle cell trait ([place])   . Thalassemia   . Type II diabetes mellitus ([place]) 2010   well controlled  . Uterine cancer [facility])     Past Surgical History:  Procedure Laterality Date  . ABDOMINAL HYSTERECTOMY  1980   2/2 endometriosis  . APPENDECTOMY  ?1980   "I think I've had it out" (11/20/2012)  . BREAST LUMPECTOMY Bilateral 1980's   "in my milk ducts; both benign" (02/27/2012)  . CORONARY ANGIOGRAM Bilateral 03/17/2011   Procedure: CORONARY ANGIOGRAM;  Surgeon: Jettie Booze, MD;  Location[facility] CATH LAB;  Service: Cardiovascular;  Laterality: Bilateral;  . ERCP  07/2010   w/stent placement  at [facility], then stent removal (08/05/2010)  . ESOPHAGOGASTRODUODENOSCOPY N/A 10/26/2013   Procedure: ESOPHAGOGASTRODUODENOSCOPY (EGD);  Surgeon: Jerene Bears, MD;  Location: [facility] ENDOSCOPY;  Service: Endoscopy;  Laterality: N/A;  . Pancreatic stent placement/removal     placed in 2011; removed in 2012/H&P (02/27/2012); "I've had several" (03/29/2017)  . SPHINCTEROTOMY     Archie Endo 11/20/2012    There were no vitals filed for this  visit.  Subjective Assessment - 04/29/19 0848    Subjective  Pt arrives reporting improved ability to reach behind head.    Currently in Pain?  No/denies         [facility] PT Assessment - 04/29/19 0001      AROM   Right Shoulder Flexion  135 Degrees    Right Shoulder ABduction  135 Degrees   scaption   Right Shoulder Internal Rotation  --   reach L5    Right Shoulder External Rotation  --   Reach to T2                  [facility] Adult PT Treatment/Exercise - 04/29/19 0001      Shoulder Exercises: Supine   Horizontal ABduction  20 reps    Theraband Level  (Shoulder Horizontal ABduction)  Level 3 (Green)    Flexion Limitations  4# punch x 30    Diagonals  20 reps    Theraband Level (Shoulder Diagonals)  Level 3 (Green)    Other Supine Exercises  narrow grip pullovers green band x 20       Shoulder Exercises: Sidelying   External Rotation  20 reps    External Rotation Weight (lbs)  2    ABduction  20 reps    ABduction Weight (lbs)  2      Shoulder Exercises: Standing   External Rotation  20 reps    Theraband Level (Shoulder External Rotation)  Level 3 (Green)    Internal Rotation  Strengthening;Right;20 reps    Theraband Level (Shoulder Internal Rotation)  Level 3 (Green)    Extension  20 reps    Theraband Level (Shoulder Extension)  Level 3 (Green)    Row  20 reps    Theraband Level (Shoulder Row)  Level 3 (Green)    Other Standing Exercises  5# cabinet to chest to mid shelf x 15 using bilat UE      Shoulder Exercises: ROM/Strengthening   UBE (Upper Arm Bike)  3 min Forward, 3 min back , Level 2 mph      Shoulder Exercises: Stretch   Internal Rotation Stretch  3 reps    Internal Rotation Stretch Limitations  towel in axiall , self mob followed by AAROM      Moist Heat Therapy   Number Minutes Moist Heat  10 Minutes    Moist Heat Location  Shoulder      Manual Therapy   Passive ROM  Rt UE all planes                PT Short Term Goals - 04/15/19 0809      PT SHORT TERM GOAL #1   Title  Pt will be I with initial HEP    Baseline  pt reports doing them as much as she can with no questions    Time  4    Period  Weeks    Status  Achieved      PT SHORT TERM GOAL #2   Title  Pt will be able to raise arm to shoulder height (flexion and abduction) , 100 deg with pain no more than 4/10 most of the time for ADLs, housework    Baseline  able to flex > 135 , 130 abdct no pain    Time  4    Period  Weeks    Status  Achieved    Target Date  03/04/19      PT SHORT TERM GOAL #  3   Title  Pt will be able to use Rt arm to  chop, cook and at waist height without increased pain    Baseline  much improved, some difficulty with large hard items to chop, improved abiltiy to use caneopener    Time  4    Period  Weeks    Status  Achieved    Target Date  03/04/19        PT Long Term Goals - 04/17/19 7510      PT LONG TERM GOAL #1   Title  Pt will demonstrate full AROM of Rt UE within 20 deg of Lt UE for full mobility of upper body    Baseline  RT shoulder flexion 135     abduction  130          ER   55 degrees in 45 degrees of abduction             IR  70 degrees (04/08/2019)    Time  8    Period  Weeks    Status  On-going      PT LONG TERM GOAL #2   Title  Pt will be able to sleep without interruption from pain most nights of the week.    Baseline  sometimes pain interrupts    Status  Partially Met      PT LONG TERM GOAL #3   Title  Pt will be able to ;get dressed, washed and do light home tasks without limitation from pain    Time  8    Period  Weeks    Status  Achieved      PT LONG TERM GOAL #4   Title  Pt will demo Rt UE strength to 4/5 or more for maximal shoulder function.    Baseline  met in supine    Time  8    Period  Weeks    Status  On-going      PT LONG TERM GOAL #5   Title  Pt will be I with HEP upon discharge    Time  8    Period  Weeks    Status  On-going      PT LONG TERM GOAL #6   Title  Pt will score FOTO limited no more than 35%    Baseline  FOTO:45 % limitation 03/25/2019    Time  8    Period  Weeks    Status  On-going            Plan - 04/29/19 2585    Clinical Impression Statement  Improvement in AROM noted. She still has end range pain with PROM. She reports improved ease with reach behind head.    PT Next Visit Plan  functional strength, try wall/quadruped/    PT Home Exercise Plan  supine cane, table slides, red band ER/IR and horiz abd unattached, sleeper and corner stretch, wall slides flexion and abduction(scaption), wall slides       Patient will  benefit from skilled therapeutic intervention in order to improve the following deficits and impairments:  Pain, Decreased activity tolerance, Decreased range of motion, Decreased strength, Impaired UE functional use, Impaired flexibility, Decreased mobility  Visit Diagnosis: Muscle weakness (generalized)  Stiffness of right shoulder, not elsewhere classified  Acute pain of right shoulder     Problem List Patient Active Problem List   Diagnosis Date Noted  . Adhesive capsulitis of right shoulder 09/19/2018  . Arthralgia 01/19/2018  . Grief  reaction 02/24/2017  . Chronic pancreatitis ([place]) 08/31/2016  . Opioid dependence ([place]) 04/28/2016  . Sickle cell trait ([place]) 11/16/2011  . Seizure disorder (history after stroke last sz late 1990s) 11/16/2011  . Beta thalassemia ([place]) 11/16/2011  . History of asthma 11/16/2011  . Chronic abdominal pain   . Preventative health care 07/20/2010  . GERD 08/03/2007  . Congenital anomaly of pancreas 02/28/2006  . Hyperlipidemia 02/06/2006  . Essential hypertension 02/06/2006  . Diabetes type 2, controlled ([place]) 02/07/1992    Dorene Ar , PTA 04/29/2019, 9:39 AM  [place], [place], [address] Phone: [phone]   Fax:  [phone]  Name: [name] MRN: [id number] Date of Birth: [birth year]

## 2019-05-01 ENCOUNTER — Other Ambulatory Visit: Payer: Self-pay

## 2019-05-01 ENCOUNTER — Ambulatory Visit: Payer: Medicare Other | Admitting: Physical Therapy

## 2019-05-01 ENCOUNTER — Encounter: Payer: Self-pay | Admitting: Physical Therapy

## 2019-05-01 DIAGNOSIS — M6281 Muscle weakness (generalized): Secondary | ICD-10-CM

## 2019-05-01 DIAGNOSIS — M25511 Pain in right shoulder: Secondary | ICD-10-CM

## 2019-05-01 DIAGNOSIS — M25611 Stiffness of right shoulder, not elsewhere classified: Secondary | ICD-10-CM

## 2019-05-01 NOTE — Therapy (Signed)
[place], [place], [address] Phone: [phone]   Fax:  [phone]  Physical Therapy Treatment  Patient Details  Name: [name] MRN: [id number] Date of Birth: Apr 11, 1956 Referring Provider (PT): Dr. Eunice Blase   Encounter Date: 05/01/2019  PT End of Session - 05/01/19 0942    Visit Number  26    Number of Visits  29    Date for PT Re-Evaluation  05/03/19    Authorization Type  N/A    PT Start Time  0930    PT Stop Time  1020    PT Time Calculation (min)  50 min       Past Medical History:  Diagnosis Date  . Beta thalassemia ([place])   . Blood dyscrasia    thalasemia, sickle cell trait  . Childhood asthma   . Chronic abdominal pain    Unclear etiology, thought to be due to chronic pancreatitis previously in setting of her pancreatic divisum - however, EUS and EGD performed at [facility]  (10/05/2011) showing normal esophageall, gastric, duodenal mucosa. EUS showing no pancreatic masses, cysts, or changes of chronic pancreatitis. Biliary system nondilated and had no endosonographic abnormalities.  . Colon cancer screening 05/23/2016  . CVA (cerebral vascular accident) [facility]) 1993   Per report by patient she had a stroke and prolonged rehab course; still "weak on left side but not much" (03/29/2017)  . Encounter for screening mammogram for breast cancer 12/02/2016  . Family history of adverse reaction to anesthesia    "mom took a long while to wake up; she was allergic to it" (03/29/2017)  . History of blood transfusion 1980s   "related to minor sickle cell crisis" (03/29/2017)  . HLD (hyperlipidemia) 2009  . Hypertension   . Iron deficiency anemia 11/16/2011  . Migraine headache    "q couple years now" (03/29/2017)  . Palpitation 07/18/2016  . Pancreatic divisum    S/P ERCP with stenting 07/2010 at [facility], then stent removal (08/05/2010)  . Pneumonia    "had it in my teens 3 times"  (03/29/2017)  . Pulmonary embolus ([place]) 08/2004   Two areas of V/Q mismatch. Findings compatible  with high probability for pulmonary embolus.; pt was on coumadin for 1 year  . Right calf pain 05/18/2018  . Seizures ([place]) 1993   "after the stroke in 1993; none for years now"  (03/29/2017)  . Sickle cell trait ([place])   . Thalassemia   . Type II diabetes mellitus ([place]) 2010   well controlled  . Uterine cancer [facility])     Past Surgical History:  Procedure Laterality Date  . ABDOMINAL HYSTERECTOMY  1980   2/2 endometriosis  . APPENDECTOMY  ?1980   "I think I've had it out" (11/20/2012)  . BREAST LUMPECTOMY Bilateral 1980's   "in my milk ducts; both benign" (02/27/2012)  . CORONARY ANGIOGRAM Bilateral 03/17/2011   Procedure: CORONARY ANGIOGRAM;  Surgeon: Jettie Booze, MD;  Location: [facility] CATH LAB;  Service: Cardiovascular;  Laterality: Bilateral;  . ERCP  07/2010   w/stent placement  at [facility], then stent removal (08/05/2010)  . ESOPHAGOGASTRODUODENOSCOPY N/A 10/26/2013   Procedure: ESOPHAGOGASTRODUODENOSCOPY (EGD);  Surgeon: Jerene Bears, MD;  Location: [facility] ENDOSCOPY;  Service: Endoscopy;  Laterality: N/A;  . Pancreatic stent placement/removal     placed in 2011; removed in 2012/H&P (02/27/2012); "I've had several" (03/29/2017)  . SPHINCTEROTOMY     Archie Endo 11/20/2012    There were no vitals filed for this  visit.  Subjective Assessment - 05/01/19 0942    Subjective  My shoulder feels better than it did last visit. I can tolerate stretching better.    Currently in Pain?  No/denies                       [facility] Adult PT Treatment/Exercise - 05/01/19 0001      Shoulder Exercises: Supine   Flexion Limitations  4# punch x 30    Diagonals  20 reps    Theraband Level (Shoulder Diagonals)  Level 3 (Green)    Other Supine Exercises  5# on dowel pullovers x 20       Shoulder Exercises: Sidelying   External Rotation  20 reps    External Rotation Weight (lbs)  3     ABduction  20 reps    ABduction Weight (lbs)  2      Shoulder Exercises: Standing   Horizontal ABduction  20 reps    Theraband Level (Shoulder Horizontal ABduction)  Level 3 (Green)    External Rotation  20 reps    Theraband Level (Shoulder External Rotation)  Level 3 (Green)    Internal Rotation  Strengthening;Right;20 reps    Theraband Level (Shoulder Internal Rotation)  Level 3 (Green)    Flexion  --   25 reps   Shoulder Flexion Weight (lbs)  2    ABduction  --   25 reps   Shoulder ABduction Weight (lbs)  2    Extension  20 reps    Theraband Level (Shoulder Extension)  Level 3 (Green)    Row  20 reps    Theraband Level (Shoulder Row)  Level 3 (Green)    Other Standing Exercises  5# cabinet to chest to mid shelf x 30 using bilat UE      Shoulder Exercises: ROM/Strengthening   UBE (Upper Arm Bike)  3 min Forward, 3 min back , Level 2 mph    Other ROM/Strengthening Exercises  UE ranger IR AAROM       Shoulder Exercises: Stretch   Internal Rotation Stretch  3 reps    Internal Rotation Stretch Limitations  towel in axiall , self mob followed by AAROM      Moist Heat Therapy   Number Minutes Moist Heat  10 Minutes    Moist Heat Location  Shoulder      Manual Therapy   Passive ROM  Rt UE all planes                PT Short Term Goals - 04/15/19 0809      PT SHORT TERM GOAL #1   Title  Pt will be I with initial HEP    Baseline  pt reports doing them as much as she can with no questions    Time  4    Period  Weeks    Status  Achieved      PT SHORT TERM GOAL #2   Title  Pt will be able to raise arm to shoulder height (flexion and abduction) , 100 deg with pain no more than 4/10 most of the time for ADLs, housework    Baseline  able to flex > 135 , 130 abdct no pain    Time  4    Period  Weeks    Status  Achieved    Target Date  03/04/19      PT SHORT TERM GOAL #3   Title  Pt will be able  to use Rt arm to chop, cook and at waist height without increased pain     Baseline  much improved, some difficulty with large hard items to chop, improved abiltiy to use caneopener    Time  4    Period  Weeks    Status  Achieved    Target Date  03/04/19        PT Long Term Goals - 04/17/19 7829      PT LONG TERM GOAL #1   Title  Pt will demonstrate full AROM of Rt UE within 20 deg of Lt UE for full mobility of upper body    Baseline  RT shoulder flexion 135     abduction  130          ER   55 degrees in 45 degrees of abduction             IR  70 degrees (04/08/2019)    Time  8    Period  Weeks    Status  On-going      PT LONG TERM GOAL #2   Title  Pt will be able to sleep without interruption from pain most nights of the week.    Baseline  sometimes pain interrupts    Status  Partially Met      PT LONG TERM GOAL #3   Title  Pt will be able to ;get dressed, washed and do light home tasks without limitation from pain    Time  8    Period  Weeks    Status  Achieved      PT LONG TERM GOAL #4   Title  Pt will demo Rt UE strength to 4/5 or more for maximal shoulder function.    Baseline  met in supine    Time  8    Period  Weeks    Status  On-going      PT LONG TERM GOAL #5   Title  Pt will be I with HEP upon discharge    Time  8    Period  Weeks    Status  On-going      PT LONG TERM GOAL #6   Title  Pt will score FOTO limited no more than 35%    Baseline  FOTO:45 % limitation 03/25/2019    Time  8    Period  Weeks    Status  On-going            Plan - 05/01/19 0942    Clinical Impression Statement  Progressed strengthening with increased repetitions and she tolerated well. Less pain with end range stretching today.    PT Next Visit Plan  functional strength, try wall/quadruped/    PT Home Exercise Plan  supine cane, table slides, red band ER/IR and horiz abd unattached, sleeper and corner stretch, wall slides flexion and abduction(scaption), wall slides       Patient will benefit from skilled therapeutic intervention in order to  improve the following deficits and impairments:  Pain, Decreased activity tolerance, Decreased range of motion, Decreased strength, Impaired UE functional use, Impaired flexibility, Decreased mobility  Visit Diagnosis: No diagnosis found.     Problem List Patient Active Problem List   Diagnosis Date Noted  . Adhesive capsulitis of right shoulder 09/19/2018  . Arthralgia 01/19/2018  . Grief reaction 02/24/2017  . Chronic pancreatitis ([place]) 08/31/2016  . Opioid dependence ([place]) 04/28/2016  . Sickle cell trait ([place]) 11/16/2011  . Seizure disorder (history after  stroke last sz late 1990s) 11/16/2011  . Beta thalassemia ([place]) 11/16/2011  . History of asthma 11/16/2011  . Chronic abdominal pain   . Preventative health care 07/20/2010  . GERD 08/03/2007  . Congenital anomaly of pancreas 02/28/2006  . Hyperlipidemia 02/06/2006  . Essential hypertension 02/06/2006  . Diabetes type 2, controlled ([place]) 02/07/1992    Dorene Ar, PTA 05/01/2019, 10:02 AM  [facility] [address] [place], [place], [address] Phone: [phone]   Fax:  [phone]  Name: [name] MRN: [id number] Date of Birth: 1956/03/11

## 2019-05-06 ENCOUNTER — Other Ambulatory Visit: Payer: Self-pay

## 2019-05-06 ENCOUNTER — Ambulatory Visit: Payer: Medicare Other | Attending: Family Medicine | Admitting: Physical Therapy

## 2019-05-06 ENCOUNTER — Encounter: Payer: Self-pay | Admitting: Physical Therapy

## 2019-05-06 DIAGNOSIS — M25511 Pain in right shoulder: Secondary | ICD-10-CM

## 2019-05-06 DIAGNOSIS — M6281 Muscle weakness (generalized): Secondary | ICD-10-CM | POA: Diagnosis not present

## 2019-05-06 DIAGNOSIS — M25611 Stiffness of right shoulder, not elsewhere classified: Secondary | ICD-10-CM | POA: Diagnosis not present

## 2019-05-06 NOTE — Therapy (Signed)
[place], [place], [address] Phone: [phone]   Fax:  [phone]  Physical Therapy Treatment  Patient Details  Name: [name] MRN: [id number] Date of Birth: Oct 06, 1955 Referring Provider (PT): Dr. Eunice Blase   Encounter Date: 05/06/2019  PT End of Session - 05/06/19 0905    Visit Number  27    Number of Visits  29    Date for PT Re-Evaluation  05/03/19    Authorization Type  N/A    PT Start Time  0845    PT Stop Time  0940    PT Time Calculation (min)  55 min       Past Medical History:  Diagnosis Date  . Beta thalassemia ([place])   . Blood dyscrasia    thalasemia, sickle cell trait  . Childhood asthma   . Chronic abdominal pain    Unclear etiology, thought to be due to chronic pancreatitis previously in setting of her pancreatic divisum - however, EUS and EGD performed at [facility]  (10/05/2011) showing normal esophageall, gastric, duodenal mucosa. EUS showing no pancreatic masses, cysts, or changes of chronic pancreatitis. Biliary system nondilated and had no endosonographic abnormalities.  . Colon cancer screening 05/23/2016  . CVA (cerebral vascular accident) [facility]) 1993   Per report by patient she had a stroke and prolonged rehab course; still "weak on left side but not much" (03/29/2017)  . Encounter for screening mammogram for breast cancer 12/02/2016  . Family history of adverse reaction to anesthesia    "mom took a long while to wake up; she was allergic to it" (03/29/2017)  . History of blood transfusion 1980s   "related to minor sickle cell crisis" (03/29/2017)  . HLD (hyperlipidemia) 2009  . Hypertension   . Iron deficiency anemia 11/16/2011  . Migraine headache    "q couple years now" (03/29/2017)  . Palpitation 07/18/2016  . Pancreatic divisum    S/P ERCP with stenting 07/2010 at [facility], then stent removal (08/05/2010)  . Pneumonia    "had it in my teens 3 times"  (03/29/2017)  . Pulmonary embolus ([place]) 08/2004   Two areas of V/Q mismatch. Findings compatible  with high probability for pulmonary embolus.; pt was on coumadin for 1 year  . Right calf pain 05/18/2018  . Seizures ([place]) 1993   "after the stroke in 1993; none for years now"  (03/29/2017)  . Sickle cell trait ([place])   . Thalassemia   . Type II diabetes mellitus ([place]) 2010   well controlled  . Uterine cancer [facility])     Past Surgical History:  Procedure Laterality Date  . ABDOMINAL HYSTERECTOMY  1980   2/2 endometriosis  . APPENDECTOMY  ?1980   "I think I've had it out" (11/20/2012)  . BREAST LUMPECTOMY Bilateral 1980's   "in my milk ducts; both benign" (02/27/2012)  . CORONARY ANGIOGRAM Bilateral 03/17/2011   Procedure: CORONARY ANGIOGRAM;  Surgeon: Jettie Booze, MD;  Location: [facility] CATH LAB;  Service: Cardiovascular;  Laterality: Bilateral;  . ERCP  07/2010   w/stent placement  at [facility], then stent removal (08/05/2010)  . ESOPHAGOGASTRODUODENOSCOPY N/A 10/26/2013   Procedure: ESOPHAGOGASTRODUODENOSCOPY (EGD);  Surgeon: Jerene Bears, MD;  Location: [facility] ENDOSCOPY;  Service: Endoscopy;  Laterality: N/A;  . Pancreatic stent placement/removal     placed in 2011; removed in 2012/H&P (02/27/2012); "I've had several" (03/29/2017)  . SPHINCTEROTOMY     Archie Endo 11/20/2012    There were no vitals filed for this  visit.      [facility] PT Assessment - 05/06/19 0001      AROM   Right Shoulder Internal Rotation  --   reach L5 , with pain                  [facility] Adult PT Treatment/Exercise - 05/06/19 0001      Shoulder Exercises: Supine   Horizontal ABduction  20 reps    Theraband Level (Shoulder Horizontal ABduction)  Level 3 (Green)    Flexion Limitations  4# punch x 30    Diagonals  20 reps    Theraband Level (Shoulder Diagonals)  Level 3 (Green)    Other Supine Exercises  5# on dowel pullovers x 20       Shoulder Exercises: Sidelying   External Rotation  20 reps     External Rotation Weight (lbs)  3    ABduction  20 reps    ABduction Weight (lbs)  2      Shoulder Exercises: Standing   Horizontal ABduction  20 reps    Theraband Level (Shoulder Horizontal ABduction)  Level 3 (Green)    External Rotation  20 reps    Theraband Level (Shoulder External Rotation)  Level 3 (Green)    Internal Rotation  Strengthening;Right;20 reps    Theraband Level (Shoulder Internal Rotation)  Level 3 (Green)    Flexion  --   25 reps   Shoulder Flexion Weight (lbs)  2    ABduction  --   25 reps   Shoulder ABduction Weight (lbs)  2    Extension  20 reps    Theraband Level (Shoulder Extension)  Level 3 (Green)    Row  20 reps    Theraband Level (Shoulder Row)  Level 3 (Green)    Other Standing Exercises  WALL Push up elbows out x 10, tricep x 10     Other Standing Exercises  5# cabinet to chest to mid shelf x 30 using bilat UE      Shoulder Exercises: ROM/Strengthening   UBE (Upper Arm Bike)  3 min Forward, 3 min back , Level 2 mph    Other ROM/Strengthening Exercises  UE ranger IR AAROM       Shoulder Exercises: Stretch   Internal Rotation Stretch  3 reps    Internal Rotation Stretch Limitations  towel in axiall , self mob followed by AAROM      Moist Heat Therapy   Number Minutes Moist Heat  10 Minutes    Moist Heat Location  Shoulder      Manual Therapy   Passive ROM  Rt UE all planes                PT Short Term Goals - 04/15/19 0809      PT SHORT TERM GOAL #1   Title  Pt will be I with initial HEP    Baseline  pt reports doing them as much as she can with no questions    Time  4    Period  Weeks    Status  Achieved      PT SHORT TERM GOAL #2   Title  Pt will be able to raise arm to shoulder height (flexion and abduction) , 100 deg with pain no more than 4/10 most of the time for ADLs, housework    Baseline  able to flex > 135 , 130 abdct no pain    Time  4    Period  Weeks    Status  Achieved    Target Date  03/04/19      PT SHORT  TERM GOAL #3   Title  Pt will be able to use Rt arm to chop, cook and at waist height without increased pain    Baseline  much improved, some difficulty with large hard items to chop, improved abiltiy to use caneopener    Time  4    Period  Weeks    Status  Achieved    Target Date  03/04/19        PT Long Term Goals - 04/17/19 3500      PT LONG TERM GOAL #1   Title  Pt will demonstrate full AROM of Rt UE within 20 deg of Lt UE for full mobility of upper body    Baseline  RT shoulder flexion 135     abduction  130          ER   55 degrees in 45 degrees of abduction             IR  70 degrees (04/08/2019)    Time  8    Period  Weeks    Status  On-going      PT LONG TERM GOAL #2   Title  Pt will be able to sleep without interruption from pain most nights of the week.    Baseline  sometimes pain interrupts    Status  Partially Met      PT LONG TERM GOAL #3   Title  Pt will be able to ;get dressed, washed and do light home tasks without limitation from pain    Time  8    Period  Weeks    Status  Achieved      PT LONG TERM GOAL #4   Title  Pt will demo Rt UE strength to 4/5 or more for maximal shoulder function.    Baseline  met in supine    Time  8    Period  Weeks    Status  On-going      PT LONG TERM GOAL #5   Title  Pt will be I with HEP upon discharge    Time  8    Period  Weeks    Status  On-going      PT LONG TERM GOAL #6   Title  Pt will score FOTO limited no more than 35%    Baseline  FOTO:45 % limitation 03/25/2019    Time  8    Period  Weeks    Status  On-going            Plan - 05/06/19 0914    Clinical Impression Statement  Pt arrives in last week of scheduled appointments. Continued with UE closed chain and strengthening, ROM. She has made improvements however is still lacking comfortable IR. She will follow up with MD for further recommendations.    PT Next Visit Plan  discharge to HEP    PT Home Exercise Plan  supine cane, table slides, red band  ER/IR and horiz abd unattached, sleeper and corner stretch, wall slides flexion and abduction(scaption), wall slides       Patient will benefit from skilled therapeutic intervention in order to improve the following deficits and impairments:  Pain, Decreased activity tolerance, Decreased range of motion, Decreased strength, Impaired UE functional use, Impaired flexibility, Decreased mobility  Visit Diagnosis: Muscle weakness (generalized)  Stiffness of right shoulder, not elsewhere  classified  Acute pain of right shoulder     Problem List Patient Active Problem List   Diagnosis Date Noted  . Adhesive capsulitis of right shoulder 09/19/2018  . Arthralgia 01/19/2018  . Grief reaction 02/24/2017  . Chronic pancreatitis ([place]) 08/31/2016  . Opioid dependence ([place]) 04/28/2016  . Sickle cell trait ([place]) 11/16/2011  . Seizure disorder (history after stroke last sz late 1990s) 11/16/2011  . Beta thalassemia ([place]) 11/16/2011  . History of asthma 11/16/2011  . Chronic abdominal pain   . Preventative health care 07/20/2010  . GERD 08/03/2007  . Congenital anomaly of pancreas 02/28/2006  . Hyperlipidemia 02/06/2006  . Essential hypertension 02/06/2006  . Diabetes type 2, controlled ([place]) 02/07/1992    Dorene Ar, PTA 05/06/2019, 9:30 AM  [place], [place], [address] Phone: [phone]   Fax:  [phone]  Name: [name] MRN: [id number] Date of Birth: 09-08-1955

## 2019-05-08 ENCOUNTER — Other Ambulatory Visit: Payer: Self-pay

## 2019-05-08 ENCOUNTER — Ambulatory Visit: Payer: Medicare Other | Admitting: Physical Therapy

## 2019-05-08 DIAGNOSIS — M25611 Stiffness of right shoulder, not elsewhere classified: Secondary | ICD-10-CM

## 2019-05-08 DIAGNOSIS — M25511 Pain in right shoulder: Secondary | ICD-10-CM

## 2019-05-08 DIAGNOSIS — M6281 Muscle weakness (generalized): Secondary | ICD-10-CM

## 2019-05-08 NOTE — Therapy (Signed)
[place], [place], [address] Phone: [phone]   Fax:  [phone]  Physical Therapy Treatment/Discharge   Patient Details  Name: [name] MRN: [id number] Date of Birth: Jun 21, 1955 Referring Provider (PT): Dr. Eunice Blase   Encounter Date: 05/08/2019  PT End of Session - 05/08/19 0833    Visit Number  28    PT Start Time  0831    PT Stop Time  0920    PT Time Calculation (min)  49 min    Activity Tolerance  Patient tolerated treatment well    Behavior During Therapy  [facility] for tasks assessed/performed       Past Medical History:  Diagnosis Date  . Beta thalassemia ([place])   . Blood dyscrasia    thalasemia, sickle cell trait  . Childhood asthma   . Chronic abdominal pain    Unclear etiology, thought to be due to chronic pancreatitis previously in setting of her pancreatic divisum - however, EUS and EGD performed at [place]  (10/05/2011) showing normal esophageall, gastric, duodenal mucosa. EUS showing no pancreatic masses, cysts, or changes of chronic pancreatitis. Biliary system nondilated and had no endosonographic abnormalities.  . Colon cancer screening 05/23/2016  . CVA (cerebral vascular accident) [facility]) 1993   Per report by patient she had a stroke and prolonged rehab course; still "weak on left side but not much" (03/29/2017)  . Encounter for screening mammogram for breast cancer 12/02/2016  . Family history of adverse reaction to anesthesia    "mom took a long while to wake up; she was allergic to it" (03/29/2017)  . History of blood transfusion 1980s   "related to minor sickle cell crisis" (03/29/2017)  . HLD (hyperlipidemia) 2009  . Hypertension   . Iron deficiency anemia 11/16/2011  . Migraine headache    "q couple years now" (03/29/2017)  . Palpitation 07/18/2016  . Pancreatic divisum    S/P ERCP with stenting 07/2010 at [facility], then stent removal (08/05/2010)  . Pneumonia     "had it in my teens 3 times" (03/29/2017)  . Pulmonary embolus ([place]) 08/2004   Two areas of V/Q mismatch. Findings compatible  with high probability for pulmonary embolus.; pt was on coumadin for 1 year  . Right calf pain 05/18/2018  . Seizures ([place]) 1993   "after the stroke in 1993; none for years now"  (03/29/2017)  . Sickle cell trait ([place])   . Thalassemia   . Type II diabetes mellitus ([place]) 2010   well controlled  . Uterine cancer [facility])     Past Surgical History:  Procedure Laterality Date  . ABDOMINAL HYSTERECTOMY  1980   2/2 endometriosis  . APPENDECTOMY  ?1980   "I think I've had it out" (11/20/2012)  . BREAST LUMPECTOMY Bilateral 1980's   "in my milk ducts; both benign" (02/27/2012)  . CORONARY ANGIOGRAM Bilateral 03/17/2011   Procedure: CORONARY ANGIOGRAM;  Surgeon: Jettie Booze, MD;  Location: [facility] CATH LAB;  Service: Cardiovascular;  Laterality: Bilateral;  . ERCP  07/2010   w/stent placement  at [facility], then stent removal (08/05/2010)  . ESOPHAGOGASTRODUODENOSCOPY N/A 10/26/2013   Procedure: ESOPHAGOGASTRODUODENOSCOPY (EGD);  Surgeon: Jerene Bears, MD;  Location: [facility] ENDOSCOPY;  Service: Endoscopy;  Laterality: N/A;  . Pancreatic stent placement/removal     placed in 2011; removed in 2012/H&P (02/27/2012); "I've had several" (03/29/2017)  . SPHINCTEROTOMY     Archie Endo 11/20/2012    There were no vitals filed for this visit.  Subjective Assessment - 05/08/19 0835    Subjective  no pain this AM.  Pain increases later in the day.  Sees Dr. Junius Roads Monday.  It used to really bother me and it is better. Its more tolerable.    Patient Stated Goals  Patient would like to be able to use her arm , less pain, sleep better    Currently in Pain?  No/denies         [facility] PT Assessment - 05/08/19 0001      Observation/Other Assessments   Focus on Therapeutic Outcomes (FOTO)   38%      AROM   Right Shoulder Flexion  135 Degrees    Right Shoulder ABduction  120 Degrees     Right Shoulder Internal Rotation  --   reach to L5   Right Shoulder External Rotation  --   reach to T1 with time and encouragement      Strength   Right Shoulder Flexion  4+/5    Right Shoulder ABduction  4/5   with bent elbow    Right Shoulder Internal Rotation  4+/5    Right Shoulder External Rotation  4+/5    Right Elbow Flexion  4/5    Left Elbow Flexion  4+/5        [facility] Adult PT Treatment/Exercise - 05/08/19 0001      Self-Care   Other Self-Care Comments   band tension and lever arms for choosing band color       Shoulder Exercises: Standing   Other Standing Exercises  goal post abduction and external rotation      Shoulder Exercises: ROM/Strengthening   UBE (Upper Arm Bike)  3 min Forward, 3 min back , Level 2 mph    [place]  flexion and scaption to prep for AROM measurement added weight shift     Pendulum  to release between sets     Other ROM/Strengthening Exercises  reaching 4 lbs to shoulder height and above to forehead hgt.       Shoulder Exercises: Stretch   Other Shoulder Stretches  wall slides for bilateral flexion x 10 to tolerance       Moist Heat Therapy   Number Minutes Moist Heat  10 Minutes    Moist Heat Location  Shoulder      Manual Therapy   Passive ROM  Rt UE all planes briefly to assess, shows improved reach with AROM vs PROM               PT Education - 05/08/19 1148    Education Details  final HEP, goals and progression, ways to self monitor progress    Person(s) Educated  Patient    Methods  Explanation    Comprehension  Verbalized understanding       PT Short Term Goals - 05/08/19 0840      PT SHORT TERM GOAL #1   Title  Pt will be I with initial HEP    Status  Achieved      PT SHORT TERM GOAL #2   Title  Pt will be able to raise arm to shoulder height (flexion and abduction) , 100 deg with pain no more than 4/10 most of the time for ADLs, housework    Status  Achieved      PT SHORT TERM GOAL #3   Title  Pt will be  able to use Rt arm to chop, cook and at waist height without increased pain    Status  Achieved        PT Long Term Goals - 05/08/19 0841      PT LONG TERM GOAL #1   Title  Pt will demonstrate full AROM of Rt UE within 20 deg of Lt UE for full mobility of upper body    Baseline  RT shoulder flexion 135     abduction  130          ER   55 degrees in 45 degrees of abduction             IR  70 degrees (04/08/2019)    Status  Partially Met      PT LONG TERM GOAL #2   Title  Pt will be able to sleep without interruption from pain most nights of the week.    Status  Achieved      PT LONG TERM GOAL #3   Title  Pt will be able to ;get dressed, washed and do light home tasks without limitation from pain    Baseline  has modified to accomodate    Status  Achieved      PT LONG TERM GOAL #4   Title  Pt will demo Rt UE strength to 4/5 or more for maximal shoulder function.    Status  Achieved      PT LONG TERM GOAL #5   Title  Pt will be I with HEP upon discharge    Status  Achieved      PT LONG TERM GOAL #6   Title  Pt will score FOTO limited no more than 35%    Baseline  38%    Status  Partially Met            Plan - 05/08/19 0859    Clinical Impression Statement  Patient has met several long term goals.  Strength and functional reach have improved a great deal.  FOTO reflects an improvement although not significant (14% improvement) to 38% .  She is pleased with her progress but still mostly limited in reaching to low back and behind head for hygiene.    PT Treatment/Interventions  ADLs/Self Care Home Management;Iontophoresis 64m/ml Dexamethasone;Passive range of motion;Ultrasound;Cryotherapy;Electrical Stimulation;Therapeutic exercise;Therapeutic activities;Patient/family education;Manual techniques;Taping;Moist Heat;Functional mobility training    PT Next Visit Plan  NA, DC    PT Home Exercise Plan  supine cane, table slides, red band ER/IR and horiz abd unattached, sleeper and  corner stretch, wall slides flexion and abduction(scaption), wall slides    Consulted and Agree with Plan of Care  Patient       Patient will benefit from skilled therapeutic intervention in order to improve the following deficits and impairments:  Pain, Decreased activity tolerance, Decreased range of motion, Decreased strength, Impaired UE functional use, Impaired flexibility, Decreased mobility  Visit Diagnosis: Muscle weakness (generalized)  Stiffness of right shoulder, not elsewhere classified  Acute pain of right shoulder     Problem List Patient Active Problem List   Diagnosis Date Noted  . Adhesive capsulitis of right shoulder 09/19/2018  . Arthralgia 01/19/2018  . Grief reaction 02/24/2017  . Chronic pancreatitis ([facility] 08/31/2016  . Opioid dependence ([place] 04/28/2016  . Sickle cell trait ([place] 11/16/2011  . Seizure disorder (history after stroke last sz late 1990s) 11/16/2011  . Beta thalassemia ([place] 11/16/2011  . History of asthma 11/16/2011  . Chronic abdominal pain   . Preventative health care 07/20/2010  . GERD 08/03/2007  . Congenital anomaly of pancreas 02/28/2006  . Hyperlipidemia 02/06/2006  .  Essential hypertension 02/06/2006  . Diabetes type 2, controlled ([place]) 02/07/1992    [name] 05/08/2019, 11:52 AM  [facility] [address] [place], [place], [address] Phone: [phone]   Fax:  [phone]  Name: [name] MRN: [id number] Date of Birth: 1955/06/07   Raeford Razor, PT 05/08/19 11:52 AM Phone: [phone] Fax: [phone]  PHYSICAL THERAPY DISCHARGE SUMMARY  Visits from Start of Care: 28  Current functional level related to goals / functional outcomes: See above    Remaining deficits: ROM, strength   Education / Equipment: HEP, RICE, posture, lifting  Plan: Patient agrees to discharge.  Patient goals were met. Patient is being discharged due to being pleased  with the current functional level.  ?????    Max rehab potential.   Raeford Razor, PT 05/08/19 11:53 AM Phone: [phone] Fax: [phone]

## 2019-05-13 ENCOUNTER — Encounter: Payer: Self-pay | Admitting: Family Medicine

## 2019-05-13 ENCOUNTER — Other Ambulatory Visit: Payer: Self-pay

## 2019-05-13 ENCOUNTER — Ambulatory Visit (INDEPENDENT_AMBULATORY_CARE_PROVIDER_SITE_OTHER): Payer: Medicare Other | Admitting: Family Medicine

## 2019-05-13 VITALS — Ht 60.0 in | Wt 111.0 lb

## 2019-05-13 DIAGNOSIS — M25511 Pain in right shoulder: Secondary | ICD-10-CM | POA: Diagnosis not present

## 2019-05-13 DIAGNOSIS — M7501 Adhesive capsulitis of right shoulder: Secondary | ICD-10-CM | POA: Diagnosis not present

## 2019-05-13 NOTE — Progress Notes (Signed)
Office Visit Note   Patient: [name]           Date of Birth: 07-01-1955           MRN: [id number] Visit Date: 05/13/2019 Requested by: Neva Seat, MD [id number] [place],  [place] [address] PCP: Neva Seat, MD  Subjective: Chief Complaint  Patient presents with  . Right Shoulder - Pain    Finished PT - better but still cannot reach behind her back. She would like to continue PT longer. Pain is not as severe, but it is does still hurt.    HPI: He is here for follow-up right shoulder adhesive capsulitis.  Making.  Steady progress in physical therapy.  She still has pain but it is much more tolerable.              ROS:   All other systems were reviewed and are negative.  Objective: Vital Signs: Ht 5' (1.524 m)   Wt 111 lb (50.3 kg)   BMI 21.68 kg/m   Physical Exam:  General:  Alert and oriented, in no acute distress. Pulm:  Breathing unlabored. Psy:  Normal mood, congruent affect.  Right shoulder: She has abduction 75 degrees, external rotation 45 and internal rotation 45 degrees.  Isometric rotator cuff strength is 5/5 throughout and pain-free.  Left shoulder range of motion is 110 degrees abduction, 90 degrees external and 90 degrees internal rotation.  Imaging: None today.  Assessment & Plan: 1.  Improving right shoulder adhesive capsulitis -Continue with physical therapy.  If she reaches a plateau we will do a glenohumeral injection.     Procedures: No procedures performed  No notes on file     PMFS History: Patient Active Problem List   Diagnosis Date Noted  . Adhesive capsulitis of right shoulder 09/19/2018  . Arthralgia 01/19/2018  . Grief reaction 02/24/2017  . Abdominal pain, chronic, left upper quadrant 09/21/2016  . Chronic pancreatitis ([place]) 08/31/2016  . Opioid dependence ([place]) 04/28/2016  . Sickle cell trait ([place]) 11/16/2011  . Seizure disorder (history after stroke last sz late 1990s) 11/16/2011  . Beta thalassemia  ([place]) 11/16/2011  . History of asthma 11/16/2011  . Chronic abdominal pain   . Preventative health care 07/20/2010  . GERD 08/03/2007  . Congenital anomaly of pancreas 02/28/2006  . Hyperlipidemia 02/06/2006  . Essential hypertension 02/06/2006  . Diabetes type 2, controlled ([place]) 02/07/1992   Past Medical History:  Diagnosis Date  . Beta thalassemia ([place])   . Blood dyscrasia    thalasemia, sickle cell trait  . Childhood asthma   . Chronic abdominal pain    Unclear etiology, thought to be due to chronic pancreatitis previously in setting of her pancreatic divisum - however, EUS and EGD performed at [place]  (10/05/2011) showing normal esophageall, gastric, duodenal mucosa. EUS showing no pancreatic masses, cysts, or changes of chronic pancreatitis. Biliary system nondilated and had no endosonographic abnormalities.  . Colon cancer screening 05/23/2016  . CVA (cerebral vascular accident) [facility]) 1993   Per report by patient she had a stroke and prolonged rehab course; still "weak on left side but not much" (03/29/2017)  . Encounter for screening mammogram for breast cancer 12/02/2016  . Family history of adverse reaction to anesthesia    "mom took a long while to wake up; she was allergic to it" (03/29/2017)  . History of blood transfusion 1980s   "related to minor sickle cell crisis" (03/29/2017)  . HLD (hyperlipidemia) 2009  .  Hypertension   . Iron deficiency anemia 11/16/2011  . Migraine headache    "q couple years now" (03/29/2017)  . Palpitation 07/18/2016  . Pancreatic divisum    S/P ERCP with stenting 07/2010 at [facility], then stent removal (08/05/2010)  . Pneumonia    "had it in my teens 3 times" (03/29/2017)  . Pulmonary embolus ([place]) 08/2004   Two areas of V/Q mismatch. Findings compatible  with high probability for pulmonary embolus.; pt was on coumadin for 1 year  . Right calf pain 05/18/2018  . Seizures ([place]) 1993   "after the stroke in 1993; none for years now"   (03/29/2017)  . Sickle cell trait ([place])   . Thalassemia   . Type II diabetes mellitus ([place]) 2010   well controlled  . Uterine cancer ([place])     Family History  Problem Relation Age of Onset  . Prostate cancer Father   . Cancer Father        stomach ca died x 1 year ago  . Sickle cell anemia Brother   . Lung cancer Maternal Aunt   . Lung cancer Maternal Uncle   . Breast cancer Maternal Grandmother   . Pancreatic cancer Maternal Grandmother   . Heart disease Maternal Grandfather   . Heart disease Paternal Grandfather   . Diabetes Other   . Cancer Other        multiple cancers pancreas,colon, breast    Past Surgical History:  Procedure Laterality Date  . ABDOMINAL HYSTERECTOMY  1980   2/2 endometriosis  . APPENDECTOMY  ?1980   "I think I've had it out" (11/20/2012)  . BREAST LUMPECTOMY Bilateral 1980's   "in my milk ducts; both benign" (02/27/2012)  . CORONARY ANGIOGRAM Bilateral 03/17/2011   Procedure: CORONARY ANGIOGRAM;  Surgeon: Jettie Booze, MD;  Location: [facility] CATH LAB;  Service: Cardiovascular;  Laterality: Bilateral;  . ERCP  07/2010   w/stent placement  at [facility], then stent removal (08/05/2010)  . ESOPHAGOGASTRODUODENOSCOPY N/A 10/26/2013   Procedure: ESOPHAGOGASTRODUODENOSCOPY (EGD);  Surgeon: Jerene Bears, MD;  Location: [facility] ENDOSCOPY;  Service: Endoscopy;  Laterality: N/A;  . Pancreatic stent placement/removal     placed in 2011; removed in 2012/H&P (02/27/2012); "I've had several" (03/29/2017)  . SPHINCTEROTOMY     Archie Endo 11/20/2012   Social History   Occupational History    Employer: UNEMPLOYED  Tobacco Use  . Smoking status: Former Smoker    Packs/day: 0.50    Years: 30.00    Pack years: 15.00    Types: Cigarettes    Quit date: 01/01/1999    Years since quitting: 20.3  . Smokeless tobacco: Never Used  Substance and Sexual Activity  . Alcohol use: No    Alcohol/week: 0.0 standard drinks  . Drug use: No  . Sexual activity: Yes    Partners: Male

## 2019-05-21 ENCOUNTER — Ambulatory Visit: Payer: Medicare Other | Admitting: Physical Therapy

## 2019-05-23 ENCOUNTER — Other Ambulatory Visit: Payer: Self-pay | Admitting: Internal Medicine

## 2019-05-23 DIAGNOSIS — Q453 Other congenital malformations of pancreas and pancreatic duct: Secondary | ICD-10-CM

## 2019-05-23 MED ORDER — PROMETHAZINE HCL 12.5 MG PO TABS: ORAL_TABLET | ORAL | 2 refills | Status: DC

## 2019-05-23 MED ORDER — PANTOPRAZOLE SODIUM 40 MG PO TBEC: 40.0000 mg | DELAYED_RELEASE_TABLET | Freq: Every day | ORAL | 2 refills | Status: DC

## 2019-05-23 NOTE — Telephone Encounter (Signed)
Refills approved.

## 2019-05-23 NOTE — Telephone Encounter (Signed)
Refill Request    pantoprazole (PROTONIX) 40 MG tablet    promethazine (PHENERGAN) 12.5 MG tablet    [facility] DRUG STORE [id number] - [place],  - [place] AT [place]

## 2019-06-26 DIAGNOSIS — R1012 Left upper quadrant pain: Secondary | ICD-10-CM | POA: Diagnosis not present

## 2019-06-26 DIAGNOSIS — Z79899 Other long term (current) drug therapy: Secondary | ICD-10-CM | POA: Diagnosis not present

## 2019-06-26 DIAGNOSIS — Z5181 Encounter for therapeutic drug level monitoring: Secondary | ICD-10-CM | POA: Diagnosis not present

## 2019-06-26 DIAGNOSIS — G894 Chronic pain syndrome: Secondary | ICD-10-CM | POA: Diagnosis not present

## 2019-06-26 DIAGNOSIS — M549 Dorsalgia, unspecified: Secondary | ICD-10-CM | POA: Diagnosis not present

## 2019-06-26 DIAGNOSIS — M5412 Radiculopathy, cervical region: Secondary | ICD-10-CM | POA: Diagnosis not present

## 2019-07-01 ENCOUNTER — Ambulatory Visit: Payer: Medicare Other | Attending: Internal Medicine

## 2019-07-01 DIAGNOSIS — Z23 Encounter for immunization: Secondary | ICD-10-CM | POA: Insufficient documentation

## 2019-07-01 NOTE — Progress Notes (Signed)
   Covid-19 Vaccination Clinic  Name:  [name]    MRN: [id number] DOB: [birth year]  07/01/2019  [name] was observed post Covid-19 immunization for 15 minutes without incidence. She was provided with Vaccine Information Sheet and instruction to access the V-Safe system.   [name] was instructed to call 911 with any severe reactions post vaccine: Marland Kitchen Difficulty breathing  . Swelling of your face and throat  . A fast heartbeat  . A bad rash all over your body  . Dizziness and weakness    Immunizations Administered    Name Date Dose VIS Date Route   Pfizer COVID-19 Vaccine 07/01/2019  8:20 AM 0.3 mL 04/12/2019 Intramuscular   Manufacturer: [place]   Lot: [id number]   [place]: [id number]

## 2019-07-11 ENCOUNTER — Other Ambulatory Visit: Payer: Self-pay | Admitting: Internal Medicine

## 2019-07-11 DIAGNOSIS — Q453 Other congenital malformations of pancreas and pancreatic duct: Secondary | ICD-10-CM

## 2019-07-11 MED ORDER — PROMETHAZINE HCL 12.5 MG PO TABS: ORAL_TABLET | ORAL | 2 refills | Status: DC

## 2019-07-11 NOTE — Telephone Encounter (Signed)
Needs refill on promethazine (PHENERGAN) 12.5 MG tablet  ;pt contact [place], [place]

## 2019-07-31 ENCOUNTER — Ambulatory Visit: Payer: Medicare Other | Attending: Internal Medicine

## 2019-07-31 DIAGNOSIS — Z23 Encounter for immunization: Secondary | ICD-10-CM

## 2019-07-31 NOTE — Progress Notes (Signed)
   Covid-19 Vaccination Clinic  Name:  [name]    MRN: [id number] DOB: 1955/09/26  07/31/2019  [name] was observed post Covid-19 immunization for 30 minutes based on pre-vaccination screening without incident. She was provided with Vaccine Information Sheet and instruction to access the V-Safe system.   [name] was instructed to call 911 with any severe reactions post vaccine: Marland Kitchen Difficulty breathing  . Swelling of face and throat  . A fast heartbeat  . A bad rash all over body  . Dizziness and weakness   Immunizations Administered    Name Date Dose VIS Date Route   Pfizer COVID-19 Vaccine 07/31/2019  8:16 AM 0.3 mL 04/12/2019 Intramuscular   Manufacturer: [place]   Lot: [id number]   [place]: [id number]

## 2019-08-08 ENCOUNTER — Other Ambulatory Visit: Payer: Self-pay | Admitting: Internal Medicine

## 2019-08-19 DIAGNOSIS — G894 Chronic pain syndrome: Secondary | ICD-10-CM | POA: Diagnosis not present

## 2019-08-19 DIAGNOSIS — M549 Dorsalgia, unspecified: Secondary | ICD-10-CM | POA: Diagnosis not present

## 2019-08-19 DIAGNOSIS — M5412 Radiculopathy, cervical region: Secondary | ICD-10-CM | POA: Diagnosis not present

## 2019-08-19 DIAGNOSIS — R1012 Left upper quadrant pain: Secondary | ICD-10-CM | POA: Diagnosis not present

## 2019-08-23 ENCOUNTER — Other Ambulatory Visit: Payer: Self-pay | Admitting: Internal Medicine

## 2019-08-23 DIAGNOSIS — Q453 Other congenital malformations of pancreas and pancreatic duct: Secondary | ICD-10-CM

## 2019-08-23 MED ORDER — PROMETHAZINE HCL 12.5 MG PO TABS: ORAL_TABLET | ORAL | 2 refills | Status: DC

## 2019-08-23 NOTE — Telephone Encounter (Signed)
Refill Request   promethazine (PHENERGAN) 12.5 MG tablet  [facility] DRUG STORE [id number] - [place],  - [address] [place]

## 2019-08-30 ENCOUNTER — Encounter: Payer: Self-pay | Admitting: *Deleted

## 2019-10-01 ENCOUNTER — Other Ambulatory Visit: Payer: Self-pay | Admitting: Internal Medicine

## 2019-10-01 DIAGNOSIS — Q453 Other congenital malformations of pancreas and pancreatic duct: Secondary | ICD-10-CM

## 2019-10-16 ENCOUNTER — Ambulatory Visit (HOSPITAL_COMMUNITY)
Admission: EM | Admit: 2019-10-16 | Discharge: 2019-10-16 | Disposition: A | Discharge status: Home / Self Care | Payer: Medicare Other

## 2019-10-16 ENCOUNTER — Other Ambulatory Visit: Payer: Self-pay

## 2019-10-16 DIAGNOSIS — G894 Chronic pain syndrome: Secondary | ICD-10-CM

## 2019-10-16 DIAGNOSIS — R519 Headache, unspecified: Secondary | ICD-10-CM

## 2019-10-16 DIAGNOSIS — K047 Periapical abscess without sinus: Secondary | ICD-10-CM

## 2019-10-16 DIAGNOSIS — I10 Essential (primary) hypertension: Secondary | ICD-10-CM

## 2019-10-16 DIAGNOSIS — R220 Localized swelling, mass and lump, head: Secondary | ICD-10-CM

## 2019-10-16 DIAGNOSIS — K861 Other chronic pancreatitis: Secondary | ICD-10-CM

## 2019-10-16 MED ORDER — HYDROCODONE-ACETAMINOPHEN 5-325 MG PO TABS
1.0000 | ORAL_TABLET | Freq: Once | ORAL | Status: AC
  Administered 2019-10-16: 1 via ORAL

## 2019-10-16 MED ORDER — HYDROCODONE-ACETAMINOPHEN 5-325 MG PO TABS
ORAL_TABLET | ORAL | Status: AC
  Filled 2019-10-16: qty 1

## 2019-10-16 MED ORDER — AMOXICILLIN-POT CLAVULANATE 875-125 MG PO TABS: 1.0000 | ORAL_TABLET | Freq: Two times a day (BID) | ORAL | 0 refills | Status: DC

## 2019-10-16 NOTE — Discharge Instructions (Signed)
[facility] [phone] extension [phone]  Dr. Civils [phone] [address]  [facility] [phone] [address]  [facility] [phone] extension [phone] [place], [place], [address] First come first serve for the first 10 clients.  May do simple extractions only, no wisdom teeth or surgery.  You may try the second for Thursday of the month starting at 6:30 AM.  [employer] You may call the school to see if they are still helping to provide dental care for emergent cases.  

## 2019-10-16 NOTE — ED Provider Notes (Signed)
[place]   MRN: [id number] DOB: 1955-12-22  Subjective:   [name] is a 64 y.o. female presenting for recurrent acute onset of severe left-sided facial pain, swelling, dental pain.  Patient has had severe dental issues in the past but does not see a dentist regularly.  She has chronic pain from her chronic pancreatitis and uses morphine daily but this is not helped her pain.  She is also tried Tylenol.  Has a history of hypertension as well and takes lisinopril regularly for this.  Denies history of chronic kidney disease.  No current facility-administered medications for this encounter.  Current Outpatient Medications:  .  acetaminophen (TYLENOL) 325 MG tablet, Take 2 tablets (650 mg total) by mouth every 6 (six) hours as needed for mild pain (or Fever >/= 101)., Disp: 30 tablet, Rfl: 0 .  calcium carbonate (OS-CAL) 1250 (500 Ca) MG chewable tablet, Chew by mouth., Disp: , Rfl:  .  lisinopril-hydrochlorothiazide (ZESTORETIC) 20-12.5 MG tablet, TAKE 1 TABLET BY MOUTH EVERY DAY, Disp: 90 tablet, Rfl: 1 .  metFORMIN (GLUCOPHAGE) 1000 MG tablet, TAKE 1 TABLET(1000 MG) BY MOUTH TWICE DAILY WITH A MEAL, Disp: 180 tablet, Rfl: 3 .  morphine (MS CONTIN) 30 MG 12 hr tablet, TK 1 T PO  Q 12 H, Disp: , Rfl:  .  pantoprazole (PROTONIX) 40 MG tablet, Take 1 tablet (40 mg total) by mouth daily., Disp: 90 tablet, Rfl: 2 .  promethazine (PHENERGAN) 12.5 MG tablet, TAKE 1 TABLET(12.5 MG) BY MOUTH EVERY 8 HOURS AS NEEDED, Disp: 30 tablet, Rfl: 2 .  calcium carbonate (TUMS - DOSED IN MG ELEMENTAL CALCIUM) 500 MG chewable tablet, Chew 1 tablet by mouth daily as needed for indigestion or heartburn., Disp: , Rfl:  .  CREON 36000 units CPEP capsule, TAKE 2 CAPSULES BY MOUTH WITH EACH MEAL ABDOMINAL 1 CAPSULE WITH EACH SNACK, Disp: 50 capsule, Rfl: 2 .  nabumetone (RELAFEN) 750 MG tablet, Take 1 tablet (750 mg total) by mouth 2 (two) times daily as needed., Disp: 60 tablet, Rfl: 6 .   omeprazole (PRILOSEC) 20 MG capsule, , Disp: , Rfl:    Allergies  Allergen Reactions  . Butalbital-Aspirin-Caffeine Anaphylaxis  . Fioricet [Butalbital-Apap-Caffeine] Other (See Comments)    Put into a coma state; "it was actually fiorinol; same family" (02/27/2012)  . Shellfish-Derived Products Anaphylaxis  . Shrimp Flavor Anaphylaxis    unknow  . Sulfonamide Derivatives Rash  . Aspirin     Stomach burning  . Sulfamethoxazole Rash    Past Medical History:  Diagnosis Date  . Beta thalassemia ([place])   . Blood dyscrasia    thalasemia, sickle cell trait  . Childhood asthma   . Chronic abdominal pain    Unclear etiology, thought to be due to chronic pancreatitis previously in setting of her pancreatic divisum - however, EUS and EGD performed at [place]  (10/05/2011) showing normal esophageall, gastric, duodenal mucosa. EUS showing no pancreatic masses, cysts, or changes of chronic pancreatitis. Biliary system nondilated and had no endosonographic abnormalities.  . Colon cancer screening 05/23/2016  . CVA (cerebral vascular accident) [facility]) 1993   Per report by patient she had a stroke and prolonged rehab course; still "weak on left side but not much" (03/29/2017)  . Encounter for screening mammogram for breast cancer 12/02/2016  . Family history of adverse reaction to anesthesia    "mom took a long while to wake up; she was allergic to it" (03/29/2017)  . History of blood transfusion 1980s   "  related to minor sickle cell crisis" (03/29/2017)  . HLD (hyperlipidemia) 2009  . Hypertension   . Iron deficiency anemia 11/16/2011  . Migraine headache    "q couple years now" (03/29/2017)  . Palpitation 07/18/2016  . Pancreatic divisum    S/P ERCP with stenting 07/2010 at [facility], then stent removal (08/05/2010)  . Pneumonia    "had it in my teens 3 times" (03/29/2017)  . Pulmonary embolus ([place]) 08/2004   Two areas of V/Q mismatch. Findings compatible  with high probability for pulmonary  embolus.; pt was on coumadin for 1 year  . Right calf pain 05/18/2018  . Seizures ([place]) 1993   "after the stroke in 1993; none for years now"  (03/29/2017)  . Sickle cell trait ([place])   . Thalassemia   . Type II diabetes mellitus ([place]) 2010   well controlled  . Uterine cancer [facility])      Past Surgical History:  Procedure Laterality Date  . ABDOMINAL HYSTERECTOMY  1980   2/2 endometriosis  . APPENDECTOMY  ?1980   "I think I've had it out" (11/20/2012)  . BREAST LUMPECTOMY Bilateral 1980's   "in my milk ducts; both benign" (02/27/2012)  . CORONARY ANGIOGRAM Bilateral 03/17/2011   Procedure: CORONARY ANGIOGRAM;  Surgeon: Jettie Booze, MD;  Location: [facility] CATH LAB;  Service: Cardiovascular;  Laterality: Bilateral;  . ERCP  07/2010   w/stent placement  at [facility], then stent removal (08/05/2010)  . ESOPHAGOGASTRODUODENOSCOPY N/A 10/26/2013   Procedure: ESOPHAGOGASTRODUODENOSCOPY (EGD);  Surgeon: Jerene Bears, MD;  Location: [facility] ENDOSCOPY;  Service: Endoscopy;  Laterality: N/A;  . Pancreatic stent placement/removal     placed in 2011; removed in 2012/H&P (02/27/2012); "I've had several" (03/29/2017)  . SPHINCTEROTOMY     Archie Endo 11/20/2012    Family History  Problem Relation Age of Onset  . Prostate cancer Father   . Cancer Father        stomach ca died x 1 year ago  . Sickle cell anemia Brother   . Lung cancer Maternal Aunt   . Lung cancer Maternal Uncle   . Breast cancer Maternal Grandmother   . Pancreatic cancer Maternal Grandmother   . Heart disease Maternal Grandfather   . Heart disease Paternal Grandfather   . Diabetes Other   . Cancer Other        multiple cancers pancreas,colon, breast    Social History   Tobacco Use  . Smoking status: Former Smoker    Packs/day: 0.50    Years: 30.00    Pack years: 15.00    Types: Cigarettes    Quit date: 01/01/1999    Years since quitting: 20.8  . Smokeless tobacco: Never Used  Vaping Use  . Vaping Use: Never used  Substance  Use Topics  . Alcohol use: No    Alcohol/week: 0.0 standard drinks  . Drug use: No    ROS   Objective:   Vitals: BP (!) 181/96 (BP Location: Right Arm)   Pulse 100   Temp 98.6 F (37 C) (Oral)   Resp 20   SpO2 100%   BP 153/81 on recheck.   Physical Exam Constitutional:      General: She is not in acute distress.    Appearance: Normal appearance. She is well-developed. She is not ill-appearing, toxic-appearing or diaphoretic.  HENT:     Head: Normocephalic and atraumatic.      Nose: Nose normal.     Mouth/Throat:     Mouth: Mucous membranes are moist.  Pharynx: Oropharynx is clear.   Eyes:     General: No scleral icterus.       Right eye: No discharge.        Left eye: No discharge.     Extraocular Movements: Extraocular movements intact.     Pupils: Pupils are equal, round, and reactive to light.  Cardiovascular:     Rate and Rhythm: Normal rate.  Pulmonary:     Effort: Pulmonary effort is normal.  Skin:    General: Skin is warm and dry.  Neurological:     General: No focal deficit present.     Mental Status: She is alert and oriented to person, place, and time.  Psychiatric:        Mood and Affect: Mood normal.        Behavior: Behavior normal.        Thought Content: Thought content normal.        Judgment: Judgment normal.    P.o. hydrocodone given in clinic.  Has done well with this in the past.  Assessment and Plan :   PDMP not reviewed this encounter.  1. Facial pain   2. Facial swelling   3. Dental infection   4. Dental abscess   5. Chronic pain syndrome   6. Chronic pancreatitis, unspecified pancreatitis type ([place])   7. Essential hypertension     Start Augmentin to cover for severe dental infection.  Contact dental surgeon as soon as possible.  Maintain chronic pain medications. Counseled patient on potential for adverse effects with medications prescribed/recommended today, ER and return-to-clinic precautions discussed, patient  verbalized understanding.    Jaynee Eagles, PA-C 10/16/19 1843

## 2019-10-16 NOTE — ED Triage Notes (Signed)
Pt c/o acute left facial swelling/pain upon waking this morning. Pt states left upper gum line is sensitive. Denies any recent tooth/sinus infection/pain.  Denies v/d, fever, chills.   Left sided facial noted from left suborbital area to left mandible.  Pt took two tylenol at approx 1400 and is routinely taking MSO4 twice/day; last dose of MSO4 was early this morning.

## 2019-10-29 DIAGNOSIS — Z5181 Encounter for therapeutic drug level monitoring: Secondary | ICD-10-CM | POA: Diagnosis not present

## 2019-10-29 DIAGNOSIS — G894 Chronic pain syndrome: Secondary | ICD-10-CM | POA: Diagnosis not present

## 2019-10-29 DIAGNOSIS — Z79899 Other long term (current) drug therapy: Secondary | ICD-10-CM | POA: Diagnosis not present

## 2019-10-29 DIAGNOSIS — G8929 Other chronic pain: Secondary | ICD-10-CM | POA: Diagnosis not present

## 2019-10-29 DIAGNOSIS — R1012 Left upper quadrant pain: Secondary | ICD-10-CM | POA: Diagnosis not present

## 2019-11-06 ENCOUNTER — Other Ambulatory Visit: Payer: Self-pay | Admitting: Internal Medicine

## 2019-11-06 DIAGNOSIS — E119 Type 2 diabetes mellitus without complications: Secondary | ICD-10-CM

## 2019-11-07 ENCOUNTER — Other Ambulatory Visit: Payer: Self-pay | Admitting: Internal Medicine

## 2019-11-07 DIAGNOSIS — Q453 Other congenital malformations of pancreas and pancreatic duct: Secondary | ICD-10-CM

## 2019-12-16 ENCOUNTER — Other Ambulatory Visit: Payer: Self-pay | Admitting: Internal Medicine

## 2019-12-16 DIAGNOSIS — Q453 Other congenital malformations of pancreas and pancreatic duct: Secondary | ICD-10-CM

## 2019-12-17 NOTE — Telephone Encounter (Signed)
Please schedule patient for a follow up appointment per Dr Bridgett Larsson.  thanks

## 2019-12-24 DIAGNOSIS — R1012 Left upper quadrant pain: Secondary | ICD-10-CM | POA: Diagnosis not present

## 2019-12-24 DIAGNOSIS — G8929 Other chronic pain: Secondary | ICD-10-CM | POA: Diagnosis not present

## 2019-12-24 DIAGNOSIS — G894 Chronic pain syndrome: Secondary | ICD-10-CM | POA: Diagnosis not present

## 2019-12-24 DIAGNOSIS — M5412 Radiculopathy, cervical region: Secondary | ICD-10-CM | POA: Diagnosis not present

## 2019-12-25 ENCOUNTER — Encounter: Payer: Self-pay | Admitting: Student

## 2019-12-25 ENCOUNTER — Ambulatory Visit (INDEPENDENT_AMBULATORY_CARE_PROVIDER_SITE_OTHER): Payer: Medicare Other | Admitting: Student

## 2019-12-25 ENCOUNTER — Other Ambulatory Visit: Payer: Self-pay

## 2019-12-25 VITALS — BP 135/65 | HR 82 | Temp 98.5°F | Ht 60.0 in | Wt 112.1 lb

## 2019-12-25 DIAGNOSIS — E559 Vitamin D deficiency, unspecified: Secondary | ICD-10-CM | POA: Diagnosis not present

## 2019-12-25 DIAGNOSIS — Z8673 Personal history of transient ischemic attack (TIA), and cerebral infarction without residual deficits: Secondary | ICD-10-CM

## 2019-12-25 DIAGNOSIS — E119 Type 2 diabetes mellitus without complications: Secondary | ICD-10-CM | POA: Diagnosis not present

## 2019-12-25 DIAGNOSIS — K861 Other chronic pancreatitis: Secondary | ICD-10-CM | POA: Diagnosis not present

## 2019-12-25 DIAGNOSIS — E785 Hyperlipidemia, unspecified: Secondary | ICD-10-CM

## 2019-12-25 DIAGNOSIS — M7501 Adhesive capsulitis of right shoulder: Secondary | ICD-10-CM

## 2019-12-25 DIAGNOSIS — Z0000 Encounter for general adult medical examination without abnormal findings: Secondary | ICD-10-CM

## 2019-12-25 DIAGNOSIS — Z0001 Encounter for general adult medical examination with abnormal findings: Secondary | ICD-10-CM | POA: Diagnosis not present

## 2019-12-25 DIAGNOSIS — I10 Essential (primary) hypertension: Secondary | ICD-10-CM

## 2019-12-25 LAB — POCT GLYCOSYLATED HEMOGLOBIN (HGB A1C): Hemoglobin A1C: 7.2 % — AB (ref 4.0–5.6)

## 2019-12-25 LAB — GLUCOSE, CAPILLARY: Glucose-Capillary: 146 mg/dL — ABNORMAL HIGH (ref 70–99)

## 2019-12-25 MED ORDER — VITAMIN D 50 MCG (2000 UT) PO CAPS: 2000.0000 [IU] | ORAL_CAPSULE | Freq: Every day | ORAL | 1 refills | Status: DC

## 2019-12-25 MED ORDER — PRAVASTATIN SODIUM 10 MG PO TABS: 20.0000 mg | ORAL_TABLET | Freq: Every day | ORAL | 3 refills | Status: DC

## 2019-12-25 NOTE — Assessment & Plan Note (Addendum)
BP of 140/80 initially, repeat of 135/65. Denies chest pain, palpitations, dizziness, headaches, LOC. This is an acceptable reading given hx of DM.   -continue lisinopril-hctz 20-12.5 -check BMP at next visit

## 2019-12-25 NOTE — Patient Instructions (Addendum)
Thank you for allowing [name] to be a part of your care today, it was pleasure seeing you. We discussed your diabetes, hypertension, and history of stroke  I am checking these labs: urine microalbumin/creatinine, lipid panel, vitamin D level  I have made these changes to your medications: Start vitamin D3, 2000 units (1 tablet) daily Start pravastatin 10mg  daily (start this 1-2 weeks after starting the vitamin D)  Please call you ophthalmologist to schedule an eye exam Please call [phone] to schedule your mammogram and DEXA scan.  Please follow up in 3 months    Thank you, and please call the Internal Medicine Clinic at [phone] if you have any questions.  Best, Dr. Bridgett Larsson

## 2019-12-25 NOTE — Assessment & Plan Note (Signed)
-  mammogram and DEXA have been ordered, but patient has not yet scheduled them. Provided phone number to call and schedule.  -patient will call her ophthalmologist to schedule an eye exam

## 2019-12-25 NOTE — Assessment & Plan Note (Signed)
Follows with GI at [facility]. Is prescribed Creon, but is only using it intermittently since her symptoms have been well controlled, and GI is in agreement with this.  -continue Creon intermittently -prn Phenergan -follow up with GI

## 2019-12-25 NOTE — Assessment & Plan Note (Addendum)
Reports a stroke in 1993 while living in [place]. Believes this was related to her sickle cell trait. States that she was never started on Aspirin. Was previously on pravastatin 40mg  but stopped due to myalgias. She is agreeable to restarting Pravastatin at a low dose. In order to decrease her risk of myalgias, we are checking a vitamin D level and planning to replete it, given hx of Vit D deficiency and no current replacement therapy. Last TSH was normal. May also consider a SGLT-2 inhibitor in the future given co-morbid diabetes. Avoid other diabetes medications due to hx of chronic pancreatitis.  -start vitamin D3 replacement, 2000 units daily -continue Centra multivitamin -start pravastatin 10mg  daily 1-2 weeks after beginning vitamin D replacement -check vitamin D level today -check lipid panel today -follow up in 3 months, at that time recheck vitamin D level and increase pravastatin dose if possible

## 2019-12-25 NOTE — Assessment & Plan Note (Signed)
A1c of 7.2 today. Last was 7.0 in December 2020. Denies neuropathy or foot wounds. Endorses a healthy plant-based diet and also consumes cinnamon which she believes helps with her blood sugar.  -counseled on further decreasing sugar intake  -continue meformin 1000mg  BID -patient will call her ophthalmologist to make an appointment -check urine microalb/cr

## 2019-12-25 NOTE — Progress Notes (Signed)
   CC: follow up on diabetes, HTN, Hx CVA  HPI:  [name] is a 64 y.o. female with history as below presenting for follow up on diabetes, HTN, Hx CVA. Please refer to problem based charting for further details of assessment and plan of current problem and chronic medical conditions.  Past Medical History:  Diagnosis Date  . Beta thalassemia ([place])   . Blood dyscrasia    thalasemia, sickle cell trait  . Childhood asthma   . Chronic abdominal pain    Unclear etiology, thought to be due to chronic pancreatitis previously in setting of her pancreatic divisum - however, EUS and EGD performed at [place]  (10/05/2011) showing normal esophageall, gastric, duodenal mucosa. EUS showing no pancreatic masses, cysts, or changes of chronic pancreatitis. Biliary system nondilated and had no endosonographic abnormalities.  . Colon cancer screening 05/23/2016  . CVA (cerebral vascular accident) [facility]) 1993   Per report by patient she had a stroke and prolonged rehab course; still "weak on left side but not much" (03/29/2017)  . Encounter for screening mammogram for breast cancer 12/02/2016  . Family history of adverse reaction to anesthesia    "mom took a long while to wake up; she was allergic to it" (03/29/2017)  . History of blood transfusion 1980s   "related to minor sickle cell crisis" (03/29/2017)  . HLD (hyperlipidemia) 2009  . Hypertension   . Iron deficiency anemia 11/16/2011  . Migraine headache    "q couple years now" (03/29/2017)  . Palpitation 07/18/2016  . Pancreatic divisum    S/P ERCP with stenting 07/2010 at [facility], then stent removal (08/05/2010)  . Pneumonia    "had it in my teens 3 times" (03/29/2017)  . Pulmonary embolus ([place]) 08/2004   Two areas of V/Q mismatch. Findings compatible  with high probability for pulmonary embolus.; pt was on coumadin for 1 year  . Right calf pain 05/18/2018  . Seizures ([place]) 1993   "after the stroke in 1993; none for years now"   (03/29/2017)  . Sickle cell trait ([place])   . Thalassemia   . Type II diabetes mellitus ([place]) 2010   well controlled  . Uterine cancer ([place])    Review of Systems:   Review of Systems  Constitutional: Negative for chills, diaphoresis, fever and weight loss.  HENT: Negative for congestion and sore throat.   Eyes: Negative for blurred vision and double vision.  Cardiovascular: Negative for chest pain and palpitations.  Gastrointestinal: Negative for abdominal pain, diarrhea, nausea and vomiting.  Musculoskeletal: Negative for joint pain.  Skin: Negative for itching and rash.  Neurological: Negative for dizziness, seizures and loss of consciousness.    Physical Exam: Vitals:   12/25/19 1315  BP: 140/80  Pulse: 84  Temp: 98.5 F (36.9 C)  TempSrc: Oral  SpO2: 100%  Weight: 112 lb 1.6 oz (50.8 kg)  Height: 5' (1.524 m)   Constitutional: no acute distress Head: atraumatic ENT: external ears normal Cardiovascular: regular rate and rhythm, normal heart sounds, no lower extremity edema Pulmonary: effort normal, normal breath sounds bilaterally Abdominal: flat, nontender, no rebound tenderness, bowel sounds normal Skin: warm and dry Neurological: alert, no focal deficit Psychiatric: normal mood and affect  Assessment & Plan:   See Encounters Tab for problem based charting.  Patient seen with Dr. Heber Oildale

## 2019-12-25 NOTE — Assessment & Plan Note (Signed)
Saw ortho, and did PT which completed in January. Patient states that this is much improved at this time, pain has resolved and she has normal ROM.

## 2019-12-25 NOTE — Assessment & Plan Note (Signed)
Last lipid panel in 2018, with LDL of 138. Hx of stroke and DM. Previously on pravastatin 40mg  but stopped due to muscle cramps. Hx of vit D deficiency as well.   -start vitamin D3 2000 units daily -start pravastatin 10mg  daily after 1-2 weeks of vitamin D replacement, increase dose at next visit if tolerating -check lipid panel today

## 2019-12-26 LAB — LIPID PANEL
Chol/HDL Ratio: 3.1 ratio (ref 0.0–4.4)
Cholesterol, Total: 243 mg/dL — ABNORMAL HIGH (ref 100–199)
HDL: 78 mg/dL (ref >39)
LDL Chol Calc (NIH): 138 mg/dL — ABNORMAL HIGH (ref 0–99)
Triglycerides: 154 mg/dL — ABNORMAL HIGH (ref 0–149)
VLDL Cholesterol Cal: 27 mg/dL (ref 5–40)

## 2019-12-26 LAB — MICROALBUMIN / CREATININE URINE RATIO
Creatinine, Urine: 35.4 mg/dL
Microalb/Creat Ratio: <8 mg/g creat (ref 0–29)
Microalbumin, Urine: <3 ug/mL

## 2019-12-26 LAB — VITAMIN D 25 HYDROXY (VIT D DEFICIENCY, FRACTURES): Vit D, 25-Hydroxy: 31.5 ng/mL (ref 30.0–100.0)

## 2019-12-30 NOTE — Progress Notes (Signed)
Internal Medicine Clinic Attending  I saw and evaluated the patient.  I personally confirmed the key portions of the history and exam documented by Dr. Chen and I reviewed pertinent patient test results.  The assessment, diagnosis, and plan were formulated together and I agree with the documentation in the resident's note.  

## 2020-01-28 ENCOUNTER — Other Ambulatory Visit: Payer: Self-pay | Admitting: Student

## 2020-01-28 DIAGNOSIS — Q453 Other congenital malformations of pancreas and pancreatic duct: Secondary | ICD-10-CM

## 2020-02-18 DIAGNOSIS — Z79899 Other long term (current) drug therapy: Secondary | ICD-10-CM | POA: Diagnosis not present

## 2020-02-18 DIAGNOSIS — R1012 Left upper quadrant pain: Secondary | ICD-10-CM | POA: Diagnosis not present

## 2020-02-18 DIAGNOSIS — G894 Chronic pain syndrome: Secondary | ICD-10-CM | POA: Diagnosis not present

## 2020-02-18 DIAGNOSIS — G8929 Other chronic pain: Secondary | ICD-10-CM | POA: Diagnosis not present

## 2020-02-18 DIAGNOSIS — Z5181 Encounter for therapeutic drug level monitoring: Secondary | ICD-10-CM | POA: Diagnosis not present

## 2020-02-20 ENCOUNTER — Other Ambulatory Visit: Payer: Self-pay

## 2020-02-20 MED ORDER — LISINOPRIL-HYDROCHLOROTHIAZIDE 20-12.5 MG PO TABS: 1.0000 | ORAL_TABLET | Freq: Every day | ORAL | 1 refills | Status: DC

## 2020-02-20 NOTE — Telephone Encounter (Signed)
Need refill on lisinopril-hydrochlorothiazide (ZESTORETIC) 20-12.5 MG tablet ;pt contact [place], [place] [facility] [place]

## 2020-03-13 ENCOUNTER — Other Ambulatory Visit: Payer: Self-pay

## 2020-03-13 DIAGNOSIS — Q453 Other congenital malformations of pancreas and pancreatic duct: Secondary | ICD-10-CM

## 2020-03-13 MED ORDER — PROMETHAZINE HCL 12.5 MG PO TABS: 12.5000 mg | ORAL_TABLET | Freq: Three times a day (TID) | ORAL | 2 refills | Status: DC | PRN

## 2020-03-13 NOTE — Telephone Encounter (Signed)
promethazine (PHENERGAN) 12.5 MG tablet , refill request @  [facility], [place] Phone:  [phone]  Fax:  [phone]

## 2020-03-16 DIAGNOSIS — R1013 Epigastric pain: Secondary | ICD-10-CM | POA: Diagnosis not present

## 2020-03-16 DIAGNOSIS — Q453 Other congenital malformations of pancreas and pancreatic duct: Secondary | ICD-10-CM | POA: Diagnosis not present

## 2020-03-16 DIAGNOSIS — R109 Unspecified abdominal pain: Secondary | ICD-10-CM | POA: Diagnosis not present

## 2020-03-16 DIAGNOSIS — Z9689 Presence of other specified functional implants: Secondary | ICD-10-CM | POA: Diagnosis not present

## 2020-03-16 DIAGNOSIS — I10 Essential (primary) hypertension: Secondary | ICD-10-CM | POA: Diagnosis not present

## 2020-03-16 DIAGNOSIS — Z1211 Encounter for screening for malignant neoplasm of colon: Secondary | ICD-10-CM | POA: Diagnosis not present

## 2020-03-16 DIAGNOSIS — Z800 Family history of malignant neoplasm of digestive organs: Secondary | ICD-10-CM | POA: Diagnosis not present

## 2020-03-16 DIAGNOSIS — E119 Type 2 diabetes mellitus without complications: Secondary | ICD-10-CM | POA: Diagnosis not present

## 2020-03-16 DIAGNOSIS — Z23 Encounter for immunization: Secondary | ICD-10-CM | POA: Diagnosis not present

## 2020-03-16 DIAGNOSIS — Z8719 Personal history of other diseases of the digestive system: Secondary | ICD-10-CM | POA: Diagnosis not present

## 2020-03-16 DIAGNOSIS — K219 Gastro-esophageal reflux disease without esophagitis: Secondary | ICD-10-CM | POA: Diagnosis not present

## 2020-03-30 ENCOUNTER — Telehealth: Payer: Self-pay

## 2020-04-08 ENCOUNTER — Ambulatory Visit (HOSPITAL_COMMUNITY)
Admission: RE | Admit: 2020-04-08 | Discharge: 2020-04-08 | Disposition: A | Discharge status: Home / Self Care | Payer: Medicare Other | Source: Ambulatory Visit | Attending: Student in an Organized Health Care Education/Training Program | Admitting: Student in an Organized Health Care Education/Training Program

## 2020-04-08 ENCOUNTER — Ambulatory Visit (INDEPENDENT_AMBULATORY_CARE_PROVIDER_SITE_OTHER): Payer: Medicare Other | Admitting: Internal Medicine

## 2020-04-08 ENCOUNTER — Other Ambulatory Visit: Payer: Self-pay

## 2020-04-08 VITALS — BP 135/72 | HR 85 | Wt 109.8 lb

## 2020-04-08 DIAGNOSIS — Z1231 Encounter for screening mammogram for malignant neoplasm of breast: Secondary | ICD-10-CM

## 2020-04-08 DIAGNOSIS — Z0000 Encounter for general adult medical examination without abnormal findings: Secondary | ICD-10-CM | POA: Diagnosis not present

## 2020-04-08 DIAGNOSIS — S99911A Unspecified injury of right ankle, initial encounter: Secondary | ICD-10-CM

## 2020-04-08 HISTORY — DX: Unspecified injury of right ankle, initial encounter: S99.911A

## 2020-04-08 MED ORDER — PANTOPRAZOLE SODIUM 40 MG PO TBEC
40.0000 mg | DELAYED_RELEASE_TABLET | Freq: Every day | ORAL | 2 refills | Status: DC
Start: 2020-04-08 — End: 2021-01-26

## 2020-04-08 NOTE — Progress Notes (Signed)
Acute Office Visit   Patient ID: [name], female    DOB: August 07, 1955, 64 y.o.   MRN: [id number]  Subjective:  CC: ankle injury  HPI 64 y.o. presents today for evaluation of her right ankle. She was stepping down from a stepladder 5 days ago, when she felt an acute pain in her right ankle. Since then, she has had some mild swelling and persistent pain. Pain is primarily malleolar and posterior lateral. It is aggrevated with ROM and ambulation. No alleviating factors. She is weight bearing although with pain. She does not feel any popping.      ACTIVE MEDICATIONS   Current Outpatient Medications on File Prior to Visit  Medication Sig Dispense Refill  . acetaminophen (TYLENOL) 325 MG tablet Take 2 tablets (650 mg total) by mouth every 6 (six) hours as needed for mild pain (or Fever >/= 101). 30 tablet 0  . calcium carbonate (OS-CAL) 1250 (500 Ca) MG chewable tablet Chew by mouth.    . calcium carbonate (TUMS - DOSED IN MG ELEMENTAL CALCIUM) 500 MG chewable tablet Chew 1 tablet by mouth daily as needed for indigestion or heartburn.    . Cholecalciferol (VITAMIN D) 50 MCG (2000 UT) CAPS Take 1 capsule (2,000 Units total) by mouth daily. 90 capsule 1  . CREON 36000 units CPEP capsule TAKE 2 CAPSULES BY MOUTH WITH EACH MEAL ABDOMINAL 1 CAPSULE WITH EACH SNACK 50 capsule 2  . lisinopril-hydrochlorothiazide (ZESTORETIC) 20-12.5 MG tablet Take 1 tablet by mouth daily. 90 tablet 1  . metFORMIN (GLUCOPHAGE) 1000 MG tablet TAKE 1 TABLET(1000 MG) BY MOUTH TWICE DAILY WITH A MEAL 180 tablet 1  . morphine (MS CONTIN) 30 MG 12 hr tablet TK 1 T PO  Q 12 H    . nabumetone (RELAFEN) 750 MG tablet Take 1 tablet (750 mg total) by mouth 2 (two) times daily as needed. 60 tablet 6  . pravastatin (PRAVACHOL) 10 MG tablet Take 2 tablets (20 mg total) by mouth daily. 30 tablet 3  . promethazine (PHENERGAN) 12.5 MG tablet Take 1 tablet (12.5 mg total) by mouth every 8 (eight) hours as needed for nausea  or vomiting. 30 tablet 2   No current facility-administered medications on file prior to visit.    ROS  Review of Systems  Gastrointestinal: Positive for abdominal pain and nausea.  Musculoskeletal: Positive for gait problem and joint swelling.    Objective:   BP 135/72 (BP Location: Left Arm, Patient Position: Sitting, Cuff Size: Normal)   Pulse 85   Wt 109 lb 12.8 oz (49.8 kg)   SpO2 99%   BMI 21.44 kg/m  Wt Readings from Last 3 Encounters:  04/08/20 109 lb 12.8 oz (49.8 kg)  12/25/19 112 lb 1.6 oz (50.8 kg)  05/13/19 111 lb (50.3 kg)   BP Readings from Last 3 Encounters:  04/08/20 135/72  12/25/19 135/65  10/16/19 (!) 181/96   Physical Exam Constitutional:      General: She is not in acute distress.    Appearance: Normal appearance.  Musculoskeletal:     Right ankle: Swelling present. No deformity, ecchymosis or lacerations. Tenderness present over the lateral malleolus, ATF ligament, AITF ligament, CF ligament, posterior TF ligament and proximal fibula. No medial malleolus or base of 5th metatarsal tenderness. Decreased range of motion. Normal pulse.     Right Achilles Tendon: No tenderness.     Left ankle: Normal.     Comments: Analgesic gait. Pain with weight bearing on the right ankle.  Skin:    Findings: No bruising or erythema.     Health Maintenance:   Health Maintenance  Topic Date Due  . OPHTHALMOLOGY EXAM  02/07/2018  . DEXA SCAN  Never done  . MAMMOGRAM  01/28/2019  . HEMOGLOBIN A1C  03/26/2020  . PNA vac Low Risk Adult (2 of 2 - PPSV23) 04/07/2020  . LIPID PANEL  12/24/2020  . TETANUS/TDAP  04/06/2021  . FOOT EXAM  04/08/2021  . COLONOSCOPY  05/02/2021  . INFLUENZA VACCINE  Completed  . COVID-19 Vaccine  Completed  . Hepatitis C Screening  Completed     Assessment & Plan:   Problem List Items Addressed This Visit      Other   Preventative health care    Screening mammogram ordered at today's visit.      Ankle injury, right, initial  encounter    Based on history and exam, [place] Ankle Rules indicate need for xray to r/o fracture.  ACE wrap applied in clinic. Ankle xray obtained at today's visit--no sign of fracture. Pt called with results. Plan --Reviewed and provided written material on supportive therapy. Discussed that ankle sprains can take a while to heal but should improve in the upcoming weeks. Discussed increased risk of another sprain and instructed to wear appropriate footwear.She knows to call our office if anything changes or symptoms do not improve in the next 4w       Relevant Orders   DG Ankle Complete Right (Completed)    Other Visit Diagnoses    Encounter for screening mammogram for malignant neoplasm of breast    -  Primary   Relevant Orders   MM Digital Screening     She is due for a follow up with PCP for management of her chronic medical conditions. Pt expressed understanding and will follow up with her PCP after the holidays.   Pt discussed with Dr. Marty Heck, MD Internal Medicine Resident PGY-2 [facility] Pager: [phone] 04/08/2020 5:50 PM

## 2020-04-08 NOTE — Patient Instructions (Signed)
Ankle Sprain  An ankle sprain is a stretch or tear in a ligament in the ankle. Ligaments are tissues that connect bones to each other. The two most common types of ankle sprains are:  Inversion sprain. This happens when the foot turns inward and the ankle rolls outward. It affects the ligament on the outside of the foot (lateral ligament).  Eversion sprain. This happens when the foot turns outward and the ankle rolls inward. It affects the ligament on the inner side of the foot (medial ligament). What are the causes? This condition is often caused by accidentally rolling or twisting the ankle. What increases the risk? You are more likely to develop this condition if you play sports. What are the signs or symptoms? Symptoms of this condition include:  Pain in your ankle.  Swelling.  Bruising. This may develop right after you sprain your ankle or 1-2 days later.  Trouble standing or walking, especially when you turn or change directions. How is this diagnosed? This condition is diagnosed with:  A physical exam. During the exam, your health care provider will press on certain parts of your foot and ankle and try to move them in certain ways.  X-ray imaging. These may be taken to see how severe the sprain is and to check for broken bones. How is this treated? This condition may be treated with:  A brace or splint. This is used to keep the ankle from moving until it heals.  An elastic bandage. This is used to support the ankle.  Crutches.  Pain medicine.  Surgery. This may be needed if the sprain is severe.  Physical therapy. This may help to improve the range of motion in the ankle. Follow these instructions at home: If you have a brace or a splint:  Wear the brace or splint as told by your health care provider. Remove it only as told by your health care provider.  Loosen the brace or splint if your toes tingle, become numb, or turn cold and blue.  Keep the brace or  splint clean.  If the brace or splint is not waterproof: ? Do not let it get wet. ? Cover it with a watertight covering when you take a bath or a shower. If you have an elastic bandage (dressing):  Remove it to shower or bathe.  Try not to move your ankle much, but wiggle your toes from time to time. This helps to prevent swelling.  Adjust the dressing to make it more comfortable if it feels too tight.  Loosen the dressing if you have numbness or tingling in your foot, or if your foot becomes cold and blue. Managing pain, stiffness, and swelling   Take over-the-counter and prescription medicines only as told by your health care provider.  For 2-3 days, keep your ankle raised (elevated) above the level of your heart as much as possible.  If directed, put ice on the injured area: ? If you have a removable brace or splint, remove it as told by your health care provider. ? Put ice in a plastic bag. ? Place a towel between your skin and the bag. ? Leave the ice on for 20 minutes, 2-3 times a day. General instructions  Rest your ankle.  Do not use the injured limb to support your body weight until your health care provider says that you can. Use crutches as told by your health care provider.  Do not use any products that contain nicotine or tobacco, such as  cigarettes, e-cigarettes, and chewing tobacco. If you need help quitting, ask your health care provider.  Keep all follow-up visits as told by your health care provider. This is important. Contact a health care provider if:  You have rapidly increasing bruising or swelling.  Your pain is not relieved with medicine. Get help right away if:  Your foot or toes become numb or blue.  You have severe pain that gets worse. Summary  An ankle sprain is a stretch or tear in a ligament in the ankle. Ligaments are tissues that connect bones to each other.  This condition is often caused by accidentally rolling or twisting the  ankle.  Symptoms include pain, swelling, bruising, and trouble walking.  To relieve pain and swelling, put ice on the affected ankle, raise your ankle above the level of your heart, and use an elastic bandage.  Keep all follow-up visits as told by your health care provider. This is important. This information is not intended to replace advice given to you by your health care provider. Make sure you discuss any questions you have with your health care provider. Document Revised: 01/08/2018 Document Reviewed: 09/12/2017 Elsevier Patient Education  [place].

## 2020-04-08 NOTE — Assessment & Plan Note (Addendum)
Based on history and exam, [place] [facility] Rules indicate need for xray to r/o fracture.  ACE wrap applied in clinic. Ankle xray obtained at today's visit--no sign of fracture. Pt called with results. Plan --Reviewed and provided written material on supportive therapy. Discussed that ankle sprains can take a while to heal but should improve in the upcoming weeks. Discussed increased risk of another sprain and instructed to wear appropriate footwear.She knows to call our office if anything changes or symptoms do not improve in the next 4w

## 2020-04-08 NOTE — Assessment & Plan Note (Addendum)
Screening mammogram ordered at today's visit.

## 2020-04-13 NOTE — Progress Notes (Signed)
Internal Medicine Clinic Attending  Case discussed with Dr. Seawell  At the time of the visit.  We reviewed the resident's history and exam and pertinent patient test results.  I agree with the assessment, diagnosis, and plan of care documented in the resident's note.  

## 2020-04-16 ENCOUNTER — Other Ambulatory Visit: Payer: Self-pay | Admitting: Student

## 2020-04-16 DIAGNOSIS — Q453 Other congenital malformations of pancreas and pancreatic duct: Secondary | ICD-10-CM

## 2020-04-20 ENCOUNTER — Other Ambulatory Visit: Payer: Self-pay | Admitting: Student

## 2020-04-20 DIAGNOSIS — Q453 Other congenital malformations of pancreas and pancreatic duct: Secondary | ICD-10-CM

## 2020-04-21 ENCOUNTER — Other Ambulatory Visit: Payer: Self-pay

## 2020-04-21 DIAGNOSIS — Q453 Other congenital malformations of pancreas and pancreatic duct: Secondary | ICD-10-CM

## 2020-04-21 NOTE — Telephone Encounter (Signed)
Pt is calling for her refill of promethazine (PHENERGAN) 12.5 MG tablet to be sent to [facility] [place] Phone:  [phone]  Fax:  [phone]

## 2020-04-21 NOTE — Telephone Encounter (Signed)
RX filled 03/13/20 w/ 2 available refills.  Patient called and notified, she states she did not ask her pharmacy if she had any refills, but will call them now.  Instructed patient to call back if she had any problems and she verbalized understanding.  SChaplin, RN,BSN

## 2020-04-21 NOTE — Telephone Encounter (Signed)
Pt states there's no med at the pharmacy. Please call pt back.

## 2020-05-04 ENCOUNTER — Other Ambulatory Visit: Payer: Self-pay | Admitting: Internal Medicine

## 2020-05-04 DIAGNOSIS — E119 Type 2 diabetes mellitus without complications: Secondary | ICD-10-CM

## 2020-05-08 ENCOUNTER — Ambulatory Visit (INDEPENDENT_AMBULATORY_CARE_PROVIDER_SITE_OTHER): Payer: Medicare HMO | Admitting: Internal Medicine

## 2020-05-08 ENCOUNTER — Telehealth: Payer: Self-pay

## 2020-05-08 ENCOUNTER — Other Ambulatory Visit: Payer: Self-pay

## 2020-05-08 ENCOUNTER — Encounter: Payer: Self-pay | Admitting: Internal Medicine

## 2020-05-08 DIAGNOSIS — Z87828 Personal history of other (healed) physical injury and trauma: Secondary | ICD-10-CM | POA: Diagnosis not present

## 2020-05-08 DIAGNOSIS — I10 Essential (primary) hypertension: Secondary | ICD-10-CM

## 2020-05-08 DIAGNOSIS — S99911A Unspecified injury of right ankle, initial encounter: Secondary | ICD-10-CM

## 2020-05-08 NOTE — Patient Instructions (Signed)
[name], It was great seeing you! So glad the ankle is completley healed and you're able to walk around on it without difficulty.   Please continue regular follow-up with your PCP.   Stay warm and stay safe! Dr. Koleen Distance

## 2020-05-08 NOTE — Progress Notes (Signed)
Acute Office Visit  Subjective:    Patient ID: [name], female    DOB: 31-Oct-1955, 65 y.o.   MRN: [id number]  Chief Complaint  Patient presents with  . Follow-up     To ( right ) ankle pain ... Pt stated that her ankle is doing better     HPI Patient is in today for follow-up on right ankle sprain. Please see problem based charting for further details.   Past Medical History:  Diagnosis Date  . Beta thalassemia ([place])   . Blood dyscrasia    thalasemia, sickle cell trait  . Childhood asthma   . Chronic abdominal pain    Unclear etiology, thought to be due to chronic pancreatitis previously in setting of her pancreatic divisum - however, EUS and EGD performed at [facility]  (10/05/2011) showing normal esophageall, gastric, duodenal mucosa. EUS showing no pancreatic masses, cysts, or changes of chronic pancreatitis. Biliary system nondilated and had no endosonographic abnormalities.  . Colon cancer screening 05/23/2016  . CVA (cerebral vascular accident) [facility]) 1993   Per report by patient she had a stroke and prolonged rehab course; still "weak on left side but not much" (03/29/2017)  . Encounter for screening mammogram for breast cancer 12/02/2016  . Family history of adverse reaction to anesthesia    "mom took a long while to wake up; she was allergic to it" (03/29/2017)  . History of blood transfusion 1980s   "related to minor sickle cell crisis" (03/29/2017)  . HLD (hyperlipidemia) 2009  . Hypertension   . Iron deficiency anemia 11/16/2011  . Migraine headache    "q couple years now" (03/29/2017)  . Palpitation 07/18/2016  . Pancreatic divisum    S/P ERCP with stenting 07/2010 at [facility], then stent removal (08/05/2010)  . Pneumonia    "had it in my teens 3 times" (03/29/2017)  . Pulmonary embolus ([place]) 08/2004   Two areas of V/Q mismatch. Findings compatible  with high probability for pulmonary embolus.; pt was on coumadin for 1 year  . Right calf pain 05/18/2018   . Seizures ([place]) 1993   "after the stroke in 1993; none for years now"  (03/29/2017)  . Sickle cell trait ([place])   . Thalassemia   . Type II diabetes mellitus ([place]) 2010   well controlled  . Uterine cancer [facility])     Past Surgical History:  Procedure Laterality Date  . ABDOMINAL HYSTERECTOMY  1980   2/2 endometriosis  . APPENDECTOMY  ?1980   "I think I've had it out" (11/20/2012)  . BREAST LUMPECTOMY Bilateral 1980's   "in my milk ducts; both benign" (02/27/2012)  . CORONARY ANGIOGRAM Bilateral 03/17/2011   Procedure: CORONARY ANGIOGRAM;  Surgeon: Jettie Booze, MD;  Location: [facility] CATH LAB;  Service: Cardiovascular;  Laterality: Bilateral;  . ERCP  07/2010   w/stent placement  at [facility], then stent removal (08/05/2010)  . ESOPHAGOGASTRODUODENOSCOPY N/A 10/26/2013   Procedure: ESOPHAGOGASTRODUODENOSCOPY (EGD);  Surgeon: Jerene Bears, MD;  Location: [facility] ENDOSCOPY;  Service: Endoscopy;  Laterality: N/A;  . Pancreatic stent placement/removal     placed in 2011; removed in 2012/H&P (02/27/2012); "I've had several" (03/29/2017)  . SPHINCTEROTOMY     Archie Endo 11/20/2012    Family History  Problem Relation Age of Onset  . Prostate cancer Father   . Cancer Father        stomach ca died x 1 year ago  . Sickle cell anemia Brother   . Lung cancer Maternal Aunt   .  Lung cancer Maternal Uncle   . Breast cancer Maternal Grandmother   . Pancreatic cancer Maternal Grandmother   . Heart disease Maternal Grandfather   . Heart disease Paternal Grandfather   . Diabetes Other   . Cancer Other        multiple cancers pancreas,colon, breast    Social History   Socioeconomic History  . Marital status: Married    Spouse name: Not on file  . Number of children: Not on file  . Years of education: 6  . Highest education level: Not on file  Occupational History    Employer: UNEMPLOYED  Tobacco Use  . Smoking status: Former Smoker    Packs/day: 0.50    Years: 30.00    Pack years: 15.00     Types: Cigarettes    Quit date: 01/01/1999    Years since quitting: 21.3  . Smokeless tobacco: Never Used  Vaping Use  . Vaping Use: Never used  Substance and Sexual Activity  . Alcohol use: No    Alcohol/week: 0.0 standard drinks  . Drug use: No  . Sexual activity: Yes    Partners: Male  Other Topics Concern  . Not on file  Social History Narrative   Worked at [employer] as a Librarian, academic, on Retail banker about 9 hours/ day   Social Determinants of Radio broadcast assistant Strain: Not on file  Food Insecurity: Not on file  Transportation Needs: Not on file  Physical Activity: Not on file  Stress: Not on file  Social Connections: Not on file  Intimate Partner Violence: Not on file    Outpatient Medications Prior to Visit  Medication Sig Dispense Refill  . acetaminophen (TYLENOL) 325 MG tablet Take 2 tablets (650 mg total) by mouth every 6 (six) hours as needed for mild pain (or Fever >/= 101). 30 tablet 0  . calcium carbonate (OS-CAL) 1250 (500 Ca) MG chewable tablet Chew by mouth.    . calcium carbonate (TUMS - DOSED IN MG ELEMENTAL CALCIUM) 500 MG chewable tablet Chew 1 tablet by mouth daily as needed for indigestion or heartburn.    . Cholecalciferol (VITAMIN D) 50 MCG (2000 UT) CAPS Take 1 capsule (2,000 Units total) by mouth daily. 90 capsule 1  . CREON 36000 units CPEP capsule TAKE 2 CAPSULES BY MOUTH WITH EACH MEAL ABDOMINAL 1 CAPSULE WITH EACH SNACK 50 capsule 2  . lisinopril-hydrochlorothiazide (ZESTORETIC) 20-12.5 MG tablet Take 1 tablet by mouth daily. 90 tablet 1  . metFORMIN (GLUCOPHAGE) 1000 MG tablet TAKE 1 TABLET(1000 MG) BY MOUTH TWICE DAILY WITH A MEAL 180 tablet 1  . morphine (MS CONTIN) 30 MG 12 hr tablet TK 1 T PO  Q 12 H    . nabumetone (RELAFEN) 750 MG tablet Take 1 tablet (750 mg total) by mouth 2 (two) times daily as needed. 60 tablet 6  . pantoprazole (PROTONIX) 40 MG tablet Take 1 tablet (40 mg total) by mouth daily. 90 tablet 2  . pravastatin  (PRAVACHOL) 10 MG tablet Take 2 tablets (20 mg total) by mouth daily. 30 tablet 3  . promethazine (PHENERGAN) 12.5 MG tablet TAKE 1 TABLET(12.5 MG) BY MOUTH EVERY 8 HOURS AS NEEDED FOR NAUSEA OR VOMITING 30 tablet 2   No facility-administered medications prior to visit.    Allergies  Allergen Reactions  . Butalbital-Aspirin-Caffeine Anaphylaxis  . Fioricet [Butalbital-Apap-Caffeine] Other (See Comments)    Put into a coma state; "it was actually fiorinol; same family" (02/27/2012)  . Shellfish-Derived Products  Anaphylaxis  . Shrimp Flavor Anaphylaxis    unknow  . Sulfonamide Derivatives Rash  . Aspirin     Stomach burning  . Sulfamethoxazole Rash    Review of Systems  Musculoskeletal: Negative for arthralgias, gait problem and joint swelling.  Skin: Negative for color change and rash.  Neurological: Negative for numbness.       Objective:    Physical Exam Constitutional:      General: She is not in acute distress.    Appearance: Normal appearance.  Musculoskeletal:     Right ankle: No swelling, deformity or ecchymosis. No tenderness. Normal range of motion. Normal pulse.  Neurological:     Mental Status: She is alert.     BP (!) 184/83 (BP Location: Left Arm, Cuff Size: Normal)   Pulse 82   Temp 97.6 F (36.4 C) (Oral)   Ht 5' (1.524 m)   Wt 113 lb 3.2 oz (51.3 kg)   SpO2 98%   BMI 22.11 kg/m  Wt Readings from Last 3 Encounters:  05/08/20 113 lb 3.2 oz (51.3 kg)  04/08/20 109 lb 12.8 oz (49.8 kg)  12/25/19 112 lb 1.6 oz (50.8 kg)    Health Maintenance Due  Topic Date Due  . OPHTHALMOLOGY EXAM  02/07/2018  . DEXA SCAN  Never done  . MAMMOGRAM  01/28/2019  . COVID-19 Vaccine (3 - Booster for Pfizer series) 01/30/2020  . HEMOGLOBIN A1C  03/26/2020  . PNA vac Low Risk Adult (2 of 2 - PPSV23) 04/07/2020    There are no preventive care reminders to display for this patient.   Lab Results  Component Value Date   TSH 0.608 07/18/2016   Lab Results   Component Value Date   WBC 7.4 06/14/2018   HGB 11.8 06/14/2018   HCT 35.9 06/14/2018   MCV 79 06/14/2018   PLT 425 06/14/2018   Lab Results  Component Value Date   NA 139 06/14/2018   K 3.9 06/14/2018   CO2 22 06/14/2018   GLUCOSE 80 06/14/2018   BUN 12 06/14/2018   CREATININE 0.70 06/14/2018   BILITOT 0.6 05/23/2017   ALKPHOS 40 05/23/2017   AST 25 05/23/2017   ALT 10 (L) 05/23/2017   PROT 5.6 (L) 05/23/2017   ALBUMIN 2.9 (L) 05/23/2017   CALCIUM 10.3 06/14/2018   ANIONGAP 14 05/23/2017   Lab Results  Component Value Date   CHOL 243 (H) 12/25/2019   Lab Results  Component Value Date   HDL 78 12/25/2019   Lab Results  Component Value Date   LDLCALC 138 (H) 12/25/2019   Lab Results  Component Value Date   TRIG 154 (H) 12/25/2019   Lab Results  Component Value Date   CHOLHDL 3.1 12/25/2019   Lab Results  Component Value Date   HGBA1C 7.2 (A) 12/25/2019       Assessment & Plan:   Problem List Items Addressed This Visit      Cardiovascular and Mediastinum   Essential hypertension (Chronic)    Noted to have elevated blood pressure. She is insistent that her BP is always more elevated in the clinical setting. She is completely asymptomatic.  Continue monitoring at subsequent PCP visits. No medication changes today.         Other   Ankle injury, right, initial encounter    Patient presents for follow-up for right ankle sprain sustained a month ago. She followed conservative treatment measures as instructed and is pleased that her ankle is back to normal today.  No further swelling, painful ROM or issues with ambulation.           No orders of the defined types were placed in this encounter.    Delice Bison, DO

## 2020-05-08 NOTE — Telephone Encounter (Signed)
Opened in error. SChaplin, RN,BSN  

## 2020-05-11 ENCOUNTER — Encounter: Payer: Self-pay | Admitting: Internal Medicine

## 2020-05-11 NOTE — Assessment & Plan Note (Addendum)
Noted to have elevated blood pressure. She is insistent that her BP is always more elevated in the clinical setting. She is completely asymptomatic.  Continue monitoring at subsequent PCP visits. No medication changes today.

## 2020-05-11 NOTE — Assessment & Plan Note (Signed)
Patient presents for follow-up for right ankle sprain sustained a month ago. She followed conservative treatment measures as instructed and is pleased that her ankle is back to normal today. No further swelling, painful ROM or issues with ambulation.

## 2020-05-15 NOTE — Progress Notes (Signed)
Internal Medicine Clinic Attending  Case discussed with Dr. Koleen Distance  At the time of the visit.  We reviewed the resident's history and exam and pertinent patient test results.  I agree with the assessment, diagnosis, and plan of care documented in the resident's note.  Home blood pressure readings recommended for documenting resumed white coat hypertension.

## 2020-06-02 DIAGNOSIS — M542 Cervicalgia: Secondary | ICD-10-CM | POA: Diagnosis not present

## 2020-06-02 DIAGNOSIS — M5412 Radiculopathy, cervical region: Secondary | ICD-10-CM | POA: Diagnosis not present

## 2020-06-02 DIAGNOSIS — G8929 Other chronic pain: Secondary | ICD-10-CM | POA: Diagnosis not present

## 2020-06-02 DIAGNOSIS — Z5181 Encounter for therapeutic drug level monitoring: Secondary | ICD-10-CM | POA: Diagnosis not present

## 2020-06-02 DIAGNOSIS — Z79899 Other long term (current) drug therapy: Secondary | ICD-10-CM | POA: Diagnosis not present

## 2020-06-02 DIAGNOSIS — R1012 Left upper quadrant pain: Secondary | ICD-10-CM | POA: Diagnosis not present

## 2020-06-02 DIAGNOSIS — G894 Chronic pain syndrome: Secondary | ICD-10-CM | POA: Diagnosis not present

## 2020-06-12 ENCOUNTER — Telehealth: Payer: Self-pay | Admitting: *Deleted

## 2020-06-12 ENCOUNTER — Other Ambulatory Visit: Payer: Self-pay | Admitting: Internal Medicine

## 2020-06-12 DIAGNOSIS — Q453 Other congenital malformations of pancreas and pancreatic duct: Secondary | ICD-10-CM

## 2020-06-12 NOTE — Telephone Encounter (Signed)
Call from pt - stated she had not received a call from [name] about Promethazine rx.  She stated [facility] stated no refilled. Rx was wriiten 04/21/20 #30 x 2 RF's. I called [facility] - talked to the pharmacist. Stated they received the rx in December - it was refilled in December, Jan 1st and Jan 12th, so no more refills left.

## 2020-06-15 MED ORDER — PROMETHAZINE HCL 12.5 MG PO TABS: ORAL_TABLET | ORAL | 2 refills | Status: DC

## 2020-06-15 NOTE — Telephone Encounter (Signed)
Refilled sent. Thank you.

## 2020-07-16 ENCOUNTER — Encounter: Payer: Self-pay | Admitting: *Deleted

## 2020-07-16 NOTE — Progress Notes (Signed)

## 2020-07-16 NOTE — Progress Notes (Signed)
Things That May Be Affecting Your Health:  Alcohol  Hearing loss  Pain    Depression  Home Safety  Sexual Health  x Diabetes  Lack of physical activity  Stress   Difficulty with daily activities  Loneliness  Tiredness   Drug use x Medicines  Tobacco use   Falls x Motor Vehicle Safety  Weight  x Food choices x Oral Health  Other    YOUR PERSONALIZED HEALTH PLAN : 1. Schedule your next subsequent Medicare Wellness visit in one year 2. Attend all of your regular appointments to address your medical issues 3. Complete the preventative screenings and services   Annual Wellness Visit   Medicare Covered Preventative Screenings and [place] Men and Women Who How Often Need? Date of Last Service Action  Abdominal Aortic Aneurysm Adults with AAA risk factors Once      Alcohol Misuse and Counseling All Adults Screening once a year if no alcohol misuse. Counseling up to 4 face to face sessions.     Bone Density Measurement  Adults at risk for osteoporosis Once every 2 yrs x     Lipid Panel Z13.6 All adults without CV disease Once every 5 yrs       Colorectal Cancer   Stool sample or  Colonoscopy All adults 35 and older   Once every year  Every 10 years        Depression All Adults Once a year  Today   Diabetes Screening Blood glucose, post glucose load, or GTT Z13.1  All adults at risk  Pre-diabetics  Once per year  Twice per year      Diabetes  Self-Management Training All adults Diabetics 10 hrs first year; 2 hours subsequent years. Requires Copay     Glaucoma  Diabetics  Family history of glaucoma  African Americans 61 yrs +  Hispanic Americans 6 yrs + Annually - requires coppay      Hepatitis C Z72.89 or F19.20  High Risk for HCV  Born between 1945 and 1965  Annually  Once      HIV Z11.4 All adults based on risk  Annually btw ages 22 & 55 regardless of risk  Annually > 65 yrs if at increased risk      Lung Cancer Screening  Asymptomatic adults aged 54-77 with 30 pack yr history and current smoker OR quit within the last 15 yrs Annually Must have counseling and shared decision making documentation before first screen      Medical Nutrition Therapy Adults with   Diabetes  Renal disease  Kidney transplant within past 3 yrs 3 hours first year; 2 hours subsequent years     Obesity and Counseling All adults Screening once a year Counseling if BMI 30 or higher  Today   Tobacco Use Counseling Adults who use tobacco  Up to 8 visits in one year     Vaccines Z23  Hepatitis B  Influenza   Pneumonia  Adults   Once  Once every flu season  Two different vaccines separated by one year x    Next Annual Wellness Visit People with Medicare Every year  Today     Services & Screenings Women Who How Often Need  Date of Last Service Action  Mammogram  Z12.31 Women over 44 One baseline ages 7-39. Annually ager 40 yrs+ x     Pap tests All women Annually if high risk. Every 2 yrs for normal risk women  Screening for cervical cancer with   Pap (Z01.419 nl or Z01.411abnl) &  HPV Z11.51 Women aged 43 to 56 Once every 5 yrs     Screening pelvic and breast exams All women Annually if high risk. Every 2 yrs for normal risk women     Sexually Transmitted Diseases  Chlamydia  Gonorrhea  Syphilis All at risk adults Annually for non pregnant females at increased risk         [place] Men Who How Ofter Need  Date of Last Service Action  Prostate Cancer - DRE & PSA Men over 50 Annually.  DRE might require a copay.        Sexually Transmitted Diseases  Syphilis All at risk adults Annually for men at increased risk      Health Maintenance List Health Maintenance  Topic Date Due  . OPHTHALMOLOGY EXAM  02/07/2018  . DEXA SCAN  Never done  . MAMMOGRAM  01/28/2019  . COVID-19 Vaccine (3 - Booster for Pfizer series) 01/30/2020  . HEMOGLOBIN A1C  03/26/2020  . PNA vac Low Risk Adult (2 of 2  - PPSV23) 04/07/2020  . LIPID PANEL  12/24/2020  . TETANUS/TDAP  04/06/2021  . FOOT EXAM  04/08/2021  . COLONOSCOPY (Pts 45-91yrs Insurance coverage will need to be confirmed)  05/02/2021  . INFLUENZA VACCINE  Completed  . Hepatitis C Screening  Completed  . HPV VACCINES  Aged Out

## 2020-07-28 DIAGNOSIS — R1012 Left upper quadrant pain: Secondary | ICD-10-CM | POA: Diagnosis not present

## 2020-07-28 DIAGNOSIS — G894 Chronic pain syndrome: Secondary | ICD-10-CM | POA: Diagnosis not present

## 2020-07-28 DIAGNOSIS — G8929 Other chronic pain: Secondary | ICD-10-CM | POA: Diagnosis not present

## 2020-08-03 ENCOUNTER — Other Ambulatory Visit: Payer: Self-pay | Admitting: Student

## 2020-08-03 DIAGNOSIS — Q453 Other congenital malformations of pancreas and pancreatic duct: Secondary | ICD-10-CM

## 2020-08-12 DIAGNOSIS — Z87738 Personal history of other specified (corrected) congenital malformations of digestive system: Secondary | ICD-10-CM | POA: Diagnosis not present

## 2020-08-12 DIAGNOSIS — D561 Beta thalassemia: Secondary | ICD-10-CM | POA: Diagnosis not present

## 2020-08-12 DIAGNOSIS — Q453 Other congenital malformations of pancreas and pancreatic duct: Secondary | ICD-10-CM | POA: Diagnosis not present

## 2020-08-12 DIAGNOSIS — Z8719 Personal history of other diseases of the digestive system: Secondary | ICD-10-CM | POA: Diagnosis not present

## 2020-08-12 DIAGNOSIS — R1012 Left upper quadrant pain: Secondary | ICD-10-CM | POA: Diagnosis not present

## 2020-08-18 ENCOUNTER — Other Ambulatory Visit: Payer: Self-pay | Admitting: Student

## 2020-09-22 ENCOUNTER — Other Ambulatory Visit: Payer: Self-pay | Admitting: Internal Medicine

## 2020-09-22 DIAGNOSIS — Z5181 Encounter for therapeutic drug level monitoring: Secondary | ICD-10-CM | POA: Diagnosis not present

## 2020-09-22 DIAGNOSIS — R1012 Left upper quadrant pain: Secondary | ICD-10-CM | POA: Diagnosis not present

## 2020-09-22 DIAGNOSIS — Q453 Other congenital malformations of pancreas and pancreatic duct: Secondary | ICD-10-CM

## 2020-09-22 DIAGNOSIS — Z79899 Other long term (current) drug therapy: Secondary | ICD-10-CM | POA: Diagnosis not present

## 2020-09-23 ENCOUNTER — Other Ambulatory Visit: Payer: Self-pay | Admitting: Student

## 2020-10-14 ENCOUNTER — Encounter: Payer: Self-pay | Admitting: *Deleted

## 2020-10-16 DIAGNOSIS — E119 Type 2 diabetes mellitus without complications: Secondary | ICD-10-CM | POA: Diagnosis not present

## 2020-10-16 LAB — HM DIABETES EYE EXAM

## 2020-10-27 ENCOUNTER — Encounter: Payer: Self-pay | Admitting: *Deleted

## 2020-11-11 DIAGNOSIS — H25813 Combined forms of age-related cataract, bilateral: Secondary | ICD-10-CM | POA: Diagnosis not present

## 2020-11-11 DIAGNOSIS — H25811 Combined forms of age-related cataract, right eye: Secondary | ICD-10-CM | POA: Diagnosis not present

## 2020-11-11 DIAGNOSIS — H40013 Open angle with borderline findings, low risk, bilateral: Secondary | ICD-10-CM | POA: Diagnosis not present

## 2020-11-11 DIAGNOSIS — Z79899 Other long term (current) drug therapy: Secondary | ICD-10-CM | POA: Diagnosis not present

## 2020-11-11 DIAGNOSIS — H35373 Puckering of macula, bilateral: Secondary | ICD-10-CM | POA: Diagnosis not present

## 2020-11-17 ENCOUNTER — Other Ambulatory Visit: Payer: Self-pay | Admitting: Student

## 2020-11-17 DIAGNOSIS — Q453 Other congenital malformations of pancreas and pancreatic duct: Secondary | ICD-10-CM

## 2020-11-18 DIAGNOSIS — R1012 Left upper quadrant pain: Secondary | ICD-10-CM | POA: Diagnosis not present

## 2020-11-18 NOTE — Telephone Encounter (Signed)
Lst visit 05/08/20 Appt request sent to front office to assist with scheduling

## 2020-11-19 ENCOUNTER — Encounter: Payer: Self-pay | Admitting: Internal Medicine

## 2020-12-10 DIAGNOSIS — H2511 Age-related nuclear cataract, right eye: Secondary | ICD-10-CM | POA: Diagnosis not present

## 2021-01-13 DIAGNOSIS — G8929 Other chronic pain: Secondary | ICD-10-CM | POA: Diagnosis not present

## 2021-01-13 DIAGNOSIS — Z79899 Other long term (current) drug therapy: Secondary | ICD-10-CM | POA: Diagnosis not present

## 2021-01-13 DIAGNOSIS — Z5181 Encounter for therapeutic drug level monitoring: Secondary | ICD-10-CM | POA: Diagnosis not present

## 2021-01-13 DIAGNOSIS — R1012 Left upper quadrant pain: Secondary | ICD-10-CM | POA: Diagnosis not present

## 2021-01-13 DIAGNOSIS — G894 Chronic pain syndrome: Secondary | ICD-10-CM | POA: Diagnosis not present

## 2021-01-18 DIAGNOSIS — H40013 Open angle with borderline findings, low risk, bilateral: Secondary | ICD-10-CM | POA: Diagnosis not present

## 2021-01-25 ENCOUNTER — Telehealth: Payer: Self-pay

## 2021-01-25 NOTE — Telephone Encounter (Signed)
RTC, patient does not want to wait until 9/28 to be seen, she wants to come in tomorrow.  She c/o abdominal pain since Friday w/ indigestion.  Last BM yesterday, she denies N/V, denies fever.  She has a hx of chronic abdominal pain and chronic pancreatitis.  Blue team full tomorrow, opening on red team and appt made w/ Dr. Gilford Rile at [phone].  Pt instructed in pain worsens or she starts with N/V or fever to present to ED and she verbalized understanding. SChaplin, RN,BSN

## 2021-01-25 NOTE — Telephone Encounter (Signed)
Requesting to speak with a nurse about having stomach pain. Pt sch to come in 01/27/2021 with Dr. Raymondo Band. Please call pt back.

## 2021-01-26 ENCOUNTER — Ambulatory Visit (INDEPENDENT_AMBULATORY_CARE_PROVIDER_SITE_OTHER): Payer: Medicare HMO | Admitting: Internal Medicine

## 2021-01-26 ENCOUNTER — Other Ambulatory Visit: Payer: Self-pay

## 2021-01-26 ENCOUNTER — Encounter: Payer: Self-pay | Admitting: Internal Medicine

## 2021-01-26 VITALS — BP 141/70 | HR 88 | Temp 98.6°F | Resp 28 | Ht 60.0 in | Wt 108.6 lb

## 2021-01-26 DIAGNOSIS — K861 Other chronic pancreatitis: Secondary | ICD-10-CM

## 2021-01-26 DIAGNOSIS — G8929 Other chronic pain: Secondary | ICD-10-CM

## 2021-01-26 DIAGNOSIS — R109 Unspecified abdominal pain: Secondary | ICD-10-CM

## 2021-01-26 DIAGNOSIS — Q453 Other congenital malformations of pancreas and pancreatic duct: Secondary | ICD-10-CM

## 2021-01-26 MED ORDER — PROMETHAZINE HCL 12.5 MG PO TABS: ORAL_TABLET | ORAL | 2 refills | Status: DC

## 2021-01-26 MED ORDER — LISINOPRIL-HYDROCHLOROTHIAZIDE 20-12.5 MG PO TABS: 1.0000 | ORAL_TABLET | Freq: Every day | ORAL | 1 refills | Status: DC

## 2021-01-26 MED ORDER — PANTOPRAZOLE SODIUM 40 MG PO TBEC: 40.0000 mg | DELAYED_RELEASE_TABLET | Freq: Every day | ORAL | 2 refills | Status: DC

## 2021-01-26 NOTE — Assessment & Plan Note (Addendum)
Patient, with a history of pancreatic divisum, chronic left upper quadrant abdominal pain.  Presents to the clinic with acute on chronic abdominal pain. She states that her pain started on Friday. She states that she did have beef due to concurrent meals, which she typically does not eat, the day before. She additionally endorses symptoms of nausea, indigestion, and gas.  She states that eating exacerbates the pain, and that sipping on fluids/eating Jell-O alleviates her pain.  She did have a BM this morning, which improved her symptoms, but her last BM was Friday. There is no change in caliber of her stools, or blood/melena noted.  She states that her pain has gotten better over the past couple of days, and attributes her pains to a flare of her chronic pancreatitis, and the underlying culprit was her beef intake. She states that the symptoms are consistent with her prior pancreatitis exacerbations. She states that she does have Creon, but does not use it consistently secondary to cost of the medication.  Assessment: Patient with history of chronic abdominal pain, and chronic pancreatitis secondary to pancreatic divisum presents today with 5 days of abdominal pain. Pain has been getting better as the patient is currently not taking solid foods, and has been contained on Jell-O and liquids. She did eat an orange yesterday which exacerbated her pain.  But did have a normal-appearing bowel movement today which alleviated more of her pain.  Physical examination bowel sounds are present, there is some tenderness to palpation of the left upper quadrant which appears chronic in nature.  Given her signs and symptoms, this is likely an acute on chronic pancreatitis flare secondary to increased beef intake, without taking her Creon.  Discussed with the patient that given her lack of fever, this is unlikely infectious in etiology.  According to the notes from [facility] this past April, they were considering CT abdomen  and pelvis before considering endoscopies and colonoscopies, which would be due next year.  If pain persist at next visit, can consider further discussing the need for the CT abdomen pelvis. Will order CBC, CMP, lipase to rule out other etiologies, and will schedule a telehealth visit with the patient next week, as her symptoms are improving today.  Ultimately, it would be advantageous for the patient to be able to afford her Creon so events like this would not occur in the future.  We will refer to our clinical pharmacist to see if there are programs patient can qualify for to afford her Creon.  Additionally, even though patient endorses signs of dyspepsia as the normal for her pancreatitis, given prolonged PPI exposure I do have some concern for possible H. pylori, may need further evaluation once the patient is able to tolerate being off her Protonix. - Patient to continue increasing p.o. intake as tolerated - Patient to continue chronic pain meds for her pain - CBC, CMP, lipase - Telehealth visit next week for follow-up visit, pain is increased patient informed to go to the nearest ED, or call clinic which she agrees. - Continue Protonix 40 mg daily - Refill Phenergan for nausea - Consider stool antigen testing for H. pylori if patient is feeling well, and able to tolerate being off of Protonix. - Continue following with [facility]

## 2021-01-26 NOTE — Progress Notes (Signed)
CC: Abdominal pain  HPI:  [name] is a 65 y.o. person, with a PMH noted below, who presents to the clinic for abdominal pain. To see the management of their acute and chronic conditions, please see the A&P note under the Encounters tab.   Past Medical History:  Diagnosis Date   Beta thalassemia (HCC)    Blood dyscrasia    thalasemia, sickle cell trait   Childhood asthma    Chronic abdominal pain    Unclear etiology, thought to be due to chronic pancreatitis previously in setting of her pancreatic divisum - however, EUS and EGD performed at [facility]  (10/05/2011) showing normal esophageall, gastric, duodenal mucosa. EUS showing no pancreatic masses, cysts, or changes of chronic pancreatitis. Biliary system nondilated and had no endosonographic abnormalities.   Colon cancer screening 05/23/2016   CVA (cerebral vascular accident) [facility]) 1993   Per report by patient she had a stroke and prolonged rehab course; still "weak on left side but not much" (03/29/2017)   Encounter for screening mammogram for breast cancer 12/02/2016   Family history of adverse reaction to anesthesia    "mom took a long while to wake up; she was allergic to it" (03/29/2017)   History of blood transfusion 1980s   "related to minor sickle cell crisis" (03/29/2017)   HLD (hyperlipidemia) 2009   Hypertension    Iron deficiency anemia 11/16/2011   Migraine headache    "q couple years now" (03/29/2017)   Palpitation 07/18/2016   Pancreatic divisum    S/P ERCP with stenting 07/2010 at [facility], then stent removal (08/05/2010)   Pneumonia    "had it in my teens 3 times" (03/29/2017)   Pulmonary embolus ([place]) 08/2004   Two areas of V/Q mismatch. Findings compatible  with high probability for pulmonary embolus.; pt was on coumadin for 1 year   Right calf pain 05/18/2018   Seizures ([place]) 1993   "after the stroke in 1993; none for years now"  (03/29/2017)   Sickle cell trait ([place])    Thalassemia    Type  II diabetes mellitus ([place]) 2010   well controlled   Uterine cancer ([place])    Review of Systems:   Review of Systems  Constitutional:  Negative for chills, diaphoresis, fever, malaise/fatigue and weight loss.  Respiratory:  Negative for cough.   Cardiovascular:  Negative for chest pain, palpitations, orthopnea and claudication.  Gastrointestinal:  Positive for abdominal pain and nausea. Negative for blood in stool, constipation, diarrhea, melena and vomiting.  Genitourinary:  Negative for dysuria, flank pain, frequency, hematuria and urgency.  Skin:  Negative for rash.  Neurological:  Negative for dizziness and headaches.    Physical Exam:  Vitals:   01/26/21 0902  BP: (!) 141/70  Pulse: 88  Resp: (!) 28  Temp: 98.6 F (37 C)  TempSrc: Oral  SpO2: 100%  Weight: 108 lb 9.6 oz (49.3 kg)  Height: 5' (1.524 m)   Physical Exam Constitutional:      Appearance: She is well-developed.  HENT:     Head: Normocephalic and atraumatic.  Cardiovascular:     Rate and Rhythm: Normal rate and regular rhythm.     Pulses: Normal pulses.     Heart sounds: Normal heart sounds.  Abdominal:     General: Abdomen is flat. Bowel sounds are normal. There is no distension.     Palpations: There is no mass.     Tenderness: There is abdominal tenderness. There is no guarding or rebound.  Comments: Bowel sounds present in all 4 quadrants, tenderness to palpitation in the left upper quadrant, no rebound tenderness or guarding  Neurological:     General: No focal deficit present.     Mental Status: She is alert and oriented to person, place, and time.  Psychiatric:        Mood and Affect: Mood normal.        Behavior: Behavior normal.     Assessment & Plan:   See Encounters Tab for problem based charting.  Patient discussed with Dr. Philipp Ovens
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
## 2021-01-26 NOTE — Progress Notes (Signed)
Internal Medicine Clinic Attending ? ?Case discussed with Dr. Winters  At the time of the visit.  We reviewed the resident?s history and exam and pertinent patient test results.  I agree with the assessment, diagnosis, and plan of care documented in the resident?s note.  ?

## 2021-01-27 ENCOUNTER — Encounter: Payer: Medicare HMO | Admitting: Internal Medicine

## 2021-01-27 LAB — CBC
Hematocrit: 36.2 % (ref 34.0–46.6)
Hemoglobin: 12.1 g/dL (ref 11.1–15.9)
MCH: 26 pg — ABNORMAL LOW (ref 26.6–33.0)
MCHC: 33.4 g/dL (ref 31.5–35.7)
MCV: 78 fL — ABNORMAL LOW (ref 79–97)
Platelets: 383 10*3/uL (ref 150–450)
RBC: 4.65 x10E6/uL (ref 3.77–5.28)
RDW: 13.3 % (ref 11.7–15.4)
WBC: 5.5 10*3/uL (ref 3.4–10.8)

## 2021-01-27 LAB — CMP14 + ANION GAP
ALT: 7 IU/L (ref 0–32)
AST: 13 IU/L (ref 0–40)
Albumin/Globulin Ratio: 1.7 (ref 1.2–2.2)
Albumin: 4.8 g/dL (ref 3.8–4.8)
Alkaline Phosphatase: 62 IU/L (ref 44–121)
Anion Gap: 15 mmol/L (ref 10.0–18.0)
BUN/Creatinine Ratio: 17 (ref 12–28)
BUN: 22 mg/dL (ref 8–27)
Bilirubin Total: 0.3 mg/dL (ref 0.0–1.2)
CO2: 25 mmol/L (ref 20–29)
Calcium: 10.9 mg/dL — ABNORMAL HIGH (ref 8.7–10.3)
Chloride: 94 mmol/L — ABNORMAL LOW (ref 96–106)
Creatinine, Ser: 1.3 mg/dL — ABNORMAL HIGH (ref 0.57–1.00)
Globulin, Total: 2.9 g/dL (ref 1.5–4.5)
Glucose: 281 mg/dL — ABNORMAL HIGH (ref 70–99)
Potassium: 4.1 mmol/L (ref 3.5–5.2)
Sodium: 134 mmol/L (ref 134–144)
Total Protein: 7.7 g/dL (ref 6.0–8.5)
eGFR: 45 mL/min/{1.73_m2} — ABNORMAL LOW (ref >59)

## 2021-01-27 LAB — LIPASE: Lipase: 20 U/L (ref 14–72)

## 2021-02-03 ENCOUNTER — Telehealth: Payer: Self-pay

## 2021-02-03 NOTE — Telephone Encounter (Signed)
SPOKE WITH PT REGARDING PATIENT ASSISTANCE FOR CREON THROUGH [facility]. PT INTERESTED AND WOULD LIKE THE APPLICATION MAILED TO HER HOME.

## 2021-02-19 ENCOUNTER — Other Ambulatory Visit: Payer: Self-pay | Admitting: Internal Medicine

## 2021-02-19 DIAGNOSIS — E119 Type 2 diabetes mellitus without complications: Secondary | ICD-10-CM

## 2021-02-26 ENCOUNTER — Other Ambulatory Visit: Payer: Self-pay | Admitting: Internal Medicine

## 2021-03-15 ENCOUNTER — Other Ambulatory Visit: Payer: Self-pay

## 2021-03-15 DIAGNOSIS — Q453 Other congenital malformations of pancreas and pancreatic duct: Secondary | ICD-10-CM

## 2021-03-15 MED ORDER — PROMETHAZINE HCL 12.5 MG PO TABS: ORAL_TABLET | ORAL | 2 refills | Status: DC

## 2021-03-17 DIAGNOSIS — R1012 Left upper quadrant pain: Secondary | ICD-10-CM | POA: Diagnosis not present

## 2021-03-17 DIAGNOSIS — M542 Cervicalgia: Secondary | ICD-10-CM | POA: Diagnosis not present

## 2021-03-17 DIAGNOSIS — K861 Other chronic pancreatitis: Secondary | ICD-10-CM | POA: Diagnosis not present

## 2021-04-27 ENCOUNTER — Other Ambulatory Visit: Payer: Self-pay | Admitting: Student

## 2021-04-27 DIAGNOSIS — Q453 Other congenital malformations of pancreas and pancreatic duct: Secondary | ICD-10-CM

## 2021-04-28 NOTE — Telephone Encounter (Signed)
Call to patient stated she is taking the Phenergan at least 2 times a day.  Sometimes can go without second dose.  Has had flare ups of the Nausea frequently lately.  Has an appointment with her GI doctor in January.  Is currently going out of town and does not want to run out.

## 2021-05-13 ENCOUNTER — Encounter (HOSPITAL_COMMUNITY): Payer: Self-pay | Admitting: Emergency Medicine

## 2021-05-13 ENCOUNTER — Emergency Department (HOSPITAL_COMMUNITY): Payer: Medicare HMO

## 2021-05-13 ENCOUNTER — Emergency Department (HOSPITAL_COMMUNITY)
Admission: EM | Admit: 2021-05-13 | Discharge: 2021-05-13 | Disposition: A | Discharge status: Home / Self Care | Payer: Medicare HMO | Attending: Emergency Medicine | Admitting: Emergency Medicine

## 2021-05-13 DIAGNOSIS — I10 Essential (primary) hypertension: Secondary | ICD-10-CM | POA: Diagnosis not present

## 2021-05-13 DIAGNOSIS — M79652 Pain in left thigh: Secondary | ICD-10-CM | POA: Diagnosis not present

## 2021-05-13 DIAGNOSIS — Z7984 Long term (current) use of oral hypoglycemic drugs: Secondary | ICD-10-CM | POA: Diagnosis not present

## 2021-05-13 DIAGNOSIS — M79605 Pain in left leg: Secondary | ICD-10-CM | POA: Diagnosis not present

## 2021-05-13 DIAGNOSIS — Y92410 Unspecified street and highway as the place of occurrence of the external cause: Secondary | ICD-10-CM | POA: Insufficient documentation

## 2021-05-13 DIAGNOSIS — M79622 Pain in left upper arm: Secondary | ICD-10-CM | POA: Diagnosis not present

## 2021-05-13 DIAGNOSIS — Z79899 Other long term (current) drug therapy: Secondary | ICD-10-CM | POA: Insufficient documentation

## 2021-05-13 DIAGNOSIS — M79602 Pain in left arm: Secondary | ICD-10-CM | POA: Diagnosis not present

## 2021-05-13 DIAGNOSIS — E119 Type 2 diabetes mellitus without complications: Secondary | ICD-10-CM | POA: Diagnosis not present

## 2021-05-13 MED ORDER — IBUPROFEN 200 MG PO TABS
600.0000 mg | ORAL_TABLET | Freq: Once | ORAL | Status: AC
Start: 2021-05-13 — End: 2021-05-13
  Administered 2021-05-13: 600 mg via ORAL
  Filled 2021-05-13: qty 3
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
## 2021-05-13 NOTE — ED Notes (Signed)
Dc instructions reviewed with pt no questions or concerns at this time.  
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
## 2021-05-17 DIAGNOSIS — Z79899 Other long term (current) drug therapy: Secondary | ICD-10-CM | POA: Diagnosis not present

## 2021-05-17 DIAGNOSIS — R1012 Left upper quadrant pain: Secondary | ICD-10-CM | POA: Diagnosis not present

## 2021-05-17 DIAGNOSIS — K861 Other chronic pancreatitis: Secondary | ICD-10-CM | POA: Diagnosis not present

## 2021-05-17 DIAGNOSIS — Z5181 Encounter for therapeutic drug level monitoring: Secondary | ICD-10-CM | POA: Diagnosis not present

## 2021-06-03 ENCOUNTER — Other Ambulatory Visit: Payer: Self-pay | Admitting: *Deleted

## 2021-06-03 DIAGNOSIS — Q453 Other congenital malformations of pancreas and pancreatic duct: Secondary | ICD-10-CM

## 2021-06-03 MED ORDER — PROMETHAZINE HCL 12.5 MG PO TABS: ORAL_TABLET | ORAL | 0 refills | Status: DC

## 2021-07-06 ENCOUNTER — Other Ambulatory Visit: Payer: Self-pay | Admitting: Internal Medicine

## 2021-07-06 DIAGNOSIS — Q453 Other congenital malformations of pancreas and pancreatic duct: Secondary | ICD-10-CM

## 2021-07-28 DIAGNOSIS — R1012 Left upper quadrant pain: Secondary | ICD-10-CM | POA: Diagnosis not present

## 2021-08-04 ENCOUNTER — Encounter: Payer: Medicare HMO | Admitting: Internal Medicine

## 2021-08-11 ENCOUNTER — Other Ambulatory Visit: Payer: Self-pay | Admitting: Internal Medicine

## 2021-08-11 DIAGNOSIS — Q453 Other congenital malformations of pancreas and pancreatic duct: Secondary | ICD-10-CM

## 2021-08-24 ENCOUNTER — Ambulatory Visit (INDEPENDENT_AMBULATORY_CARE_PROVIDER_SITE_OTHER): Payer: Medicare HMO | Admitting: Internal Medicine

## 2021-08-24 VITALS — BP 155/90 | HR 108 | Temp 99.0°F | Wt 103.1 lb

## 2021-08-24 DIAGNOSIS — H6692 Otitis media, unspecified, left ear: Secondary | ICD-10-CM | POA: Diagnosis not present

## 2021-08-24 DIAGNOSIS — I10 Essential (primary) hypertension: Secondary | ICD-10-CM

## 2021-08-24 DIAGNOSIS — H6690 Otitis media, unspecified, unspecified ear: Secondary | ICD-10-CM

## 2021-08-24 HISTORY — DX: Otitis media, unspecified, unspecified ear: H66.90

## 2021-08-24 MED ORDER — FLUTICASONE PROPIONATE 50 MCG/ACT NA SUSP: 2.0000 | Freq: Every day | NASAL | 0 refills | Status: DC

## 2021-08-24 MED ORDER — AMOXICILLIN-POT CLAVULANATE 875-125 MG PO TABS: 1.0000 | ORAL_TABLET | Freq: Two times a day (BID) | ORAL | 0 refills | Status: AC

## 2021-08-24 NOTE — Assessment & Plan Note (Signed)
Blood pressure today elevated to the 559R systolic.  This is likely secondary to patient's pain, will reassess at next clinic visit. ?- Follow-up vitals at next appointment. ?

## 2021-08-24 NOTE — Patient Instructions (Addendum)
To Ms. Benecke:  ? ?It was a pleasure seeing you today. Today we discussed your ear pain. On examination, you have an infection of your ear, for which I have provided a packet. I am going to prescribe you an antibiotic called Augmentin (amoxacillin-clavulanate) that I want you to take in the morning and in the evening for 5 days. I am also going to check your kidney function today to see if we can give you NSAIDS, which should help your pain. I am also prescribing a nasal spray that I want you to use daily.  ? ?If you have any fevers, neck stiffness, nausea, vomiting, or worsening pain, please call the clinic to schedule a follow up visit.  ?Have a good day,  ?Maudie Mercury, MD ?

## 2021-08-24 NOTE — Assessment & Plan Note (Addendum)
[name] was in her usual state of health until 4/23 when she began to experience left ear pain with tender swelling under her left jaw.  Patient states that the pain has been worsening daily, and this morning it was 10 out of 10 with radiation to the left jaw.  She has tried Tylenol, Orajel, heating and cooling pads, with no alleviation.  Nothing seems to aggravate the pain.  She denies any pain on chewing.  She also denies fevers, night sweats, lightheadedness, vertigo, neck rigidity, nausea, vomiting, headaches, vision changes, chest pain, or dyspnea. ? ?A/P: ?On physical examination, there is tender adenopathy of the submandibular lymph nodes.  On examination of the left ear, there is no pain with manipulation of the pinna, the patient's TM is erythematous, and bulging with no perforation, or purulence.  Discussed with the patient of otitis media.  In addition, her pressures are elevated today with mild tachycardia 106 likely secondary to her pain.  Will treat with antibiotics and nasal steroid.  Would additionally like to give NSAIDs for pain control, but her last CMP on 01/26/2021 did show an increase in her creatinine to 1.3 from a baseline 1.7.  We will assess BMP today.  We will call patient back with results. Patient given return precautions ?- Flonase 2 sprays per nare each morning ?- Augmentin 875 mg twice daily for total of 5 days ?- BMP ? ?Addendum:  ?Called patient at provided number for results, but sent to VM. Left a HIPAA compliant VM.  ?

## 2021-08-24 NOTE — Progress Notes (Signed)
? ?CC: Left Ear Pain ? ?HPI: ? ?[name] is a 66 y.o. person, with a PMH noted below, who presents to the clinic with Left Ear Pain. To see the management of their acute and chronic conditions, please see the A&P note under the Encounters tab.  ? ?Past Medical History:  ?Diagnosis Date  ? Beta thalassemia (HCC)   ? Blood dyscrasia   ? thalasemia, sickle cell trait  ? Childhood asthma   ? Chronic abdominal pain   ? Unclear etiology, thought to be due to chronic pancreatitis previously in setting of her pancreatic divisum - however, EUS and EGD performed at [facility]  (10/05/2011) showing normal esophageall, gastric, duodenal mucosa. EUS showing no pancreatic masses, cysts, or changes of chronic pancreatitis. Biliary system nondilated and had no endosonographic abnormalities.  ? Colon cancer screening 05/23/2016  ? CVA (cerebral vascular accident) [facility]) 1993  ? Per report by patient she had a stroke and prolonged rehab course; still "weak on left side but not much" (03/29/2017)  ? Encounter for screening mammogram for breast cancer 12/02/2016  ? Family history of adverse reaction to anesthesia   ? "mom took a long while to wake up; she was allergic to it" (03/29/2017)  ? History of blood transfusion 1980s  ? "related to minor sickle cell crisis" (03/29/2017)  ? HLD (hyperlipidemia) 2009  ? Hypertension   ? Iron deficiency anemia 11/16/2011  ? Migraine headache   ? "q couple years now" (03/29/2017)  ? Palpitation 07/18/2016  ? Pancreatic divisum   ? S/P ERCP with stenting 07/2010 at [facility], then stent removal (08/05/2010)  ? Pneumonia   ? "had it in my teens 3 times" (03/29/2017)  ? Pulmonary embolus ([place]) 08/2004  ? Two areas of V/Q mismatch. Findings compatible  with high probability for pulmonary embolus.; pt was on coumadin for 1 year  ? Right calf pain 05/18/2018  ? Seizures ([place]) 1993  ? "after the stroke in 1993; none for years now"  (03/29/2017)  ? Sickle cell trait ([place])   ? Thalassemia   ? Type  II diabetes mellitus ([place]) 2010  ? well controlled  ? Uterine cancer ([place])   ? ?Review of Systems:   ?Review of Systems  ?Constitutional:  Negative for chills, diaphoresis, fever and malaise/fatigue.  ?HENT:  Positive for ear pain. Negative for congestion, ear discharge, nosebleeds, sinus pain, sore throat and tinnitus.   ?Eyes:  Negative for blurred vision, double vision, photophobia and pain.  ?Respiratory:  Negative for cough and shortness of breath.   ?Cardiovascular:  Negative for palpitations.  ?Skin:  Negative for itching and rash.   ? ?Physical Exam: ? ?Vitals:  ? 08/24/21 1447  ?BP: (!) 153/88  ?Pulse: (!) 106  ?Temp: 99 ?F (37.2 ?C)  ?TempSrc: Oral  ?SpO2: 100%  ?Weight: 103 lb 1.6 oz (46.8 kg)  ? ?Physical Exam ?HENT:  ?   Head:  ?   Comments: No sinus tenderness.  Small, tender lymph node palpated at the left submandibular gland.  No occipital or cervical lymphadenopathy. ?   Right Ear: Tympanic membrane, ear canal and external ear normal. There is no impacted cerumen.  ?   Left Ear: There is no impacted cerumen.  ?   Ears:  ?   Comments: No tenderness on manipulation of the L pinna, Left TM erythematous and bulging, with no drainage or perforation.  ?   Mouth/Throat:  ?   Mouth: Mucous membranes are moist.  ?   Pharynx:  No oropharyngeal exudate or posterior oropharyngeal erythema.  ?Eyes:  ?   General: No scleral icterus.    ?   Right eye: No discharge.     ?   Left eye: No discharge.  ?   Conjunctiva/sclera: Conjunctivae normal.  ?Musculoskeletal:  ?   Cervical back: Normal range of motion and neck supple. No rigidity.  ?Lymphadenopathy:  ?   Cervical: No cervical adenopathy.  ?  ? ?Assessment & Plan:  ? ?See Encounters Tab for problem based charting. ? ?Patient discussed with Dr.  Dr. Cain Sieve   ?

## 2021-08-25 LAB — BMP8+ANION GAP
Anion Gap: 22 mmol/L — ABNORMAL HIGH (ref 10.0–18.0)
BUN/Creatinine Ratio: 11 — ABNORMAL LOW (ref 12–28)
BUN: 7 mg/dL — ABNORMAL LOW (ref 8–27)
CO2: 23 mmol/L (ref 20–29)
Calcium: 10.6 mg/dL — ABNORMAL HIGH (ref 8.7–10.3)
Chloride: 101 mmol/L (ref 96–106)
Creatinine, Ser: 0.65 mg/dL (ref 0.57–1.00)
Glucose: 132 mg/dL — ABNORMAL HIGH (ref 70–99)
Potassium: 4.4 mmol/L (ref 3.5–5.2)
Sodium: 146 mmol/L — ABNORMAL HIGH (ref 134–144)
eGFR: 96 mL/min/{1.73_m2} (ref >59)

## 2021-08-25 NOTE — Progress Notes (Signed)
Internal Medicine Clinic Attending ? ?Case discussed with Dr. Winters  At the time of the visit.  We reviewed the resident?s history and exam and pertinent patient test results.  I agree with the assessment, diagnosis, and plan of care documented in the resident?s note.  ?

## 2021-08-26 ENCOUNTER — Other Ambulatory Visit: Payer: Self-pay

## 2021-08-26 MED ORDER — LISINOPRIL-HYDROCHLOROTHIAZIDE 20-12.5 MG PO TABS: 1.0000 | ORAL_TABLET | Freq: Every day | ORAL | 1 refills | Status: DC

## 2021-08-27 ENCOUNTER — Telehealth: Payer: Self-pay

## 2021-08-27 NOTE — Telephone Encounter (Signed)
Pt had OV with Dr Gilford Rile 08/24/21. ?

## 2021-08-27 NOTE — Telephone Encounter (Signed)
Requesting test results, please call pt back.  

## 2021-08-30 ENCOUNTER — Encounter: Payer: Medicare HMO | Admitting: Student

## 2021-09-01 ENCOUNTER — Ambulatory Visit (INDEPENDENT_AMBULATORY_CARE_PROVIDER_SITE_OTHER): Payer: Medicare HMO | Admitting: Student

## 2021-09-01 ENCOUNTER — Ambulatory Visit (HOSPITAL_COMMUNITY)
Admission: RE | Admit: 2021-09-01 | Discharge: 2021-09-01 | Disposition: A | Discharge status: Home / Self Care | Payer: Medicare HMO | Source: Ambulatory Visit | Attending: Internal Medicine | Admitting: Internal Medicine

## 2021-09-01 ENCOUNTER — Encounter: Payer: Self-pay | Admitting: Student

## 2021-09-01 VITALS — BP 127/69 | HR 83 | Temp 98.4°F | Wt 102.5 lb

## 2021-09-01 DIAGNOSIS — R079 Chest pain, unspecified: Secondary | ICD-10-CM | POA: Insufficient documentation

## 2021-09-01 DIAGNOSIS — D561 Beta thalassemia: Secondary | ICD-10-CM

## 2021-09-01 DIAGNOSIS — H6690 Otitis media, unspecified, unspecified ear: Secondary | ICD-10-CM | POA: Diagnosis not present

## 2021-09-01 DIAGNOSIS — I10 Essential (primary) hypertension: Secondary | ICD-10-CM | POA: Diagnosis not present

## 2021-09-01 DIAGNOSIS — E119 Type 2 diabetes mellitus without complications: Secondary | ICD-10-CM

## 2021-09-01 DIAGNOSIS — E785 Hyperlipidemia, unspecified: Secondary | ICD-10-CM | POA: Diagnosis not present

## 2021-09-01 DIAGNOSIS — Z13820 Encounter for screening for osteoporosis: Secondary | ICD-10-CM

## 2021-09-01 DIAGNOSIS — Z0000 Encounter for general adult medical examination without abnormal findings: Secondary | ICD-10-CM

## 2021-09-01 DIAGNOSIS — R5383 Other fatigue: Secondary | ICD-10-CM

## 2021-09-01 DIAGNOSIS — Z1211 Encounter for screening for malignant neoplasm of colon: Secondary | ICD-10-CM

## 2021-09-01 LAB — GLUCOSE, CAPILLARY: Glucose-Capillary: 141 mg/dL — ABNORMAL HIGH (ref 70–99)

## 2021-09-01 LAB — CBC
HCT: 37.2 % (ref 36.0–46.0)
Hemoglobin: 12.4 g/dL (ref 12.0–15.0)
MCH: 26.3 pg (ref 26.0–34.0)
MCHC: 33.3 g/dL (ref 30.0–36.0)
MCV: 79 fL — ABNORMAL LOW (ref 80.0–100.0)
Platelets: 466 10*3/uL — ABNORMAL HIGH (ref 150–400)
RBC: 4.71 MIL/uL (ref 3.87–5.11)
RDW: 14.4 % (ref 11.5–15.5)
WBC: 7.2 10*3/uL (ref 4.0–10.5)
nRBC: 0 % (ref 0.0–0.2)

## 2021-09-01 LAB — POCT GLYCOSYLATED HEMOGLOBIN (HGB A1C): Hemoglobin A1C: 7.6 % — AB (ref 4.0–5.6)

## 2021-09-01 MED ORDER — AMOXICILLIN-POT CLAVULANATE 875-125 MG PO TABS: 1.0000 | ORAL_TABLET | Freq: Two times a day (BID) | ORAL | 0 refills | Status: AC

## 2021-09-01 NOTE — Patient Instructions (Addendum)
It was a pleasure seeing you in clinic. Today we discussed:  ? ?Ear pain ?Please take 5 more days of Augmentin to help with your ear infection ?Not improve please make a follow-up appointment with our clinic ? ?Chest pain/fatigue ?Your EKG did not show significant changes ?We will check lab work today this may be contributing to your fatigue and chest pain ?I will also order an echocardiogram and refer you to a cardiologist to discuss whether or not you need a stress test ?follow-up in 1 month ? ?Diabetes ?A1c is 7.6 today ?Please continue metformin 1000 mg twice daily ?We will try some diet changes to see if this helps with your blood sugars and follow-up in 3 months ? ?If you have any questions or concerns, please call our clinic at [phone] between 9am-5pm and after hours call [phone] and ask for the internal medicine resident on call. If you feel you are having a medical emergency please call 911.  ? ?Thank you, we look forward to helping you remain healthy! ? ?

## 2021-09-02 NOTE — Assessment & Plan Note (Signed)
BP well-controlled on current regimen.  

## 2021-09-02 NOTE — Assessment & Plan Note (Signed)
She continues to have ear pain on the left side although this is much improved.  No radiation the pain be on the ear.  Arrives as a throbbing sort of sensation deep within her ear.  She did complete 5 days of Augmenti.  Last dose was 3 days ago.  On exam left TM does appear somewhat bulging and erythematous.  Will Augmentin extend course to 10 days.  She will follow-up with she continues to have pain. ?

## 2021-09-02 NOTE — Progress Notes (Signed)
? ?Established Patient Office Visit ? ?Subjective   ?Patient ID: [name], female    DOB: 05/20/1955  Age: 66 y.o. MRN: [id number] ? ?Chief Complaint  ?Patient presents with  ? Hypertension  ?  Follow up ?  ? Chest Pain  ?  Left side upper chest wall pain x 3wks  ? Fatigue  ? ? ?[name] is a 66 year old with history listed below who presents today for follow-up of otitis media last week. She would also like to discuss 3-week history of chest pain. Please refer to problem based charting for further details and assessment and plan of current problem and chronic medical conditions. ? ? ? ?Patient Active Problem List  ? Diagnosis Date Noted  ? Otitis media 08/24/2021  ? Ankle injury, right, initial encounter 04/08/2020  ? Vitamin D deficiency 12/25/2019  ? Adhesive capsulitis of right shoulder 09/19/2018  ? Arthralgia 01/19/2018  ? Grief reaction 02/24/2017  ? Abdominal pain, chronic, left upper quadrant 09/21/2016  ? Chronic pancreatitis ([place]) 08/31/2016  ? Opioid dependence ([place]) 04/28/2016  ? Sickle cell trait ([place]) 11/16/2011  ? Seizure disorder (history after stroke last sz late 1990s) 11/16/2011  ? Beta thalassemia ([place]) 11/16/2011  ? History of stroke (mid 1990s) 11/16/2011  ? History of asthma 11/16/2011  ? Chest pain 11/16/2011  ? Chronic abdominal pain   ? Preventative health care 07/20/2010  ? GERD 08/03/2007  ? 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 Hyperlipidemia 02/06/2006  ? Essential hypertension 02/06/2006  ? Diabetes type 2, controlled ([place]) 02/07/1992  ? ?  ? ?Review of Systems  ?Constitutional:  Positive for malaise/fatigue. Negative for chills, fever and weight loss.  ?HENT:  Positive for ear pain. Negative for ear discharge and sore throat.   ?Respiratory:  Negative for cough, hemoptysis, sputum production and shortness of breath.   ?Cardiovascular:  Positive for chest pain. Negative for orthopnea and leg swelling.  ?Gastrointestinal:  Negative for abdominal pain,  diarrhea, nausea and vomiting.  ?Genitourinary:  Negative for dysuria and frequency.  ?Musculoskeletal:  Negative for back pain and falls.  ?Neurological:  Negative for dizziness, sensory change and weakness.  ?All other systems reviewed and are negative. ? ?  ?Objective:  ?  ? ?BP 127/69 (BP Location: Left Arm, Patient Position: Sitting, Cuff Size: Normal)   Pulse 83   Temp 98.4 ?F (36.9 ?C) (Oral)   Wt 102 lb 8 oz (46.5 kg)   SpO2 100%   BMI 20.02 kg/m?  ?  ? ?Physical Exam ?Constitutional:   ?   General: She is not in acute distress. ?   Appearance: She is well-developed.  ?HENT:  ?   Head: Normocephalic and atraumatic.  ?   Right Ear: Tympanic membrane normal.  ?   Left Ear: Tympanic membrane is erythematous and bulging. Tympanic membrane is not perforated.  ?   Ears:  ?   Comments: Left ear with with cerumen that was removed to better visualize the tympanic membrane ?Eyes:  ?   Extraocular Movements: Extraocular movements intact.  ?   Pupils: Pupils are equal, round, and reactive to light.  ?Cardiovascular:  ?   Rate and Rhythm: Normal rate and regular rhythm.  ?   Pulses:     ?     Radial pulses are 2+ on the right side and 2+ on the left side.  ?     Dorsalis pedis pulses are 2+ on the right side and 2+ on the left side.  ?  Heart sounds: No murmur heard. ?  No friction rub.  ?   Comments: Split S2 ?Pulmonary:  ?   Effort: Pulmonary effort is normal. No tachypnea or accessory muscle usage.  ?   Breath sounds: Normal breath sounds. No wheezing, rhonchi or rales.  ?Lymphadenopathy:  ?   Cervical: No cervical adenopathy.  ?Neurological:  ?   Mental Status: She is alert.  ?Psychiatric:     ?   Mood and Affect: Mood normal.     ?   Behavior: Behavior normal.  ? ? ? ?Results for orders placed or performed in visit on 09/01/21  ?Glucose, capillary  ?Result Value Ref Range  ? Glucose-Capillary 141 (H) 70 - 99 mg/dL  ?CBC no Diff  ?Result Value Ref Range  ? WBC 7.2 4.0 - 10.5 K/uL  ? RBC 4.71 3.87 - 5.11 MIL/uL   ? Hemoglobin 12.4 12.0 - 15.0 g/dL  ? HCT 37.2 36.0 - 46.0 %  ? MCV 79.0 (L) 80.0 - 100.0 fL  ? MCH 26.3 26.0 - 34.0 pg  ? MCHC 33.3 30.0 - 36.0 g/dL  ? RDW 14.4 11.5 - 15.5 %  ? Platelets 466 (H) 150 - 400 K/uL  ? nRBC 0.0 0.0 - 0.2 %  ?POC Hbg A1C  ?Result Value Ref Range  ? Hemoglobin A1C 7.6 (A) 4.0 - 5.6 %  ? HbA1c POC (<> result, manual entry)    ? HbA1c, POC (prediabetic range)    ? HbA1c, POC (controlled diabetic range)    ? ? ?Last CBC ?Lab Results  ?Component Value Date  ? WBC 7.2 09/01/2021  ? HGB 12.4 09/01/2021  ? HCT 37.2 09/01/2021  ? MCV 79.0 (L) 09/01/2021  ? MCH 26.3 09/01/2021  ? RDW 14.4 09/01/2021  ? PLT 466 (H) 09/01/2021  ? ?Last hemoglobin A1c ?Lab Results  ?Component Value Date  ? HGBA1C 7.6 (A) 09/01/2021  ? ? ?The 10-year ASCVD risk score (Arnett DK, et al., 2019) is: 27.3% ? ?  ?Assessment & Plan:  ? ?Problem List Items Addressed This Visit   ? ?  ? Cardiovascular and Mediastinum  ? Essential hypertension (Chronic)  ?  BP well controlled on current regimen.   ? ?  ?  ?  ? Endocrine  ? Diabetes type 2, controlled ([place]) - Primary (Chronic)  ?  A1c of 7.6 today up from prior.  She does not regularly check blood glucose at home.  She is only on metformin 1000 mg twice daily.  She attributes increased sugars and that she is eating raisin bread lately instead of her usual whole-wheat bread.  We discussed her monitoring her diet and trying to avoid frequent consumption of dried fruits as they may contain large amounts of sugar.  We discussed that she should try to incorporate more whole grains, fresh fruits and vegetables, and lean proteins in her diet.  We will continue on her metformin and recheck A1c in 3 months.  She deferred foot exam today but reports no lesions on her feet. ? ?  ?  ? Relevant Orders  ? POC Hbg A1C (Completed)  ?  ? Nervous and Auditory  ? Otitis media  ?  She continues to have ear pain on the left side although this is much improved.  No radiation the pain be on the ear.   Arrives as a throbbing sort of sensation deep within her ear.  She did complete 5 days of Augmenti.  Last dose was 3 days ago.  On exam left TM does appear somewhat bulging and erythematous.  Will Augmentin extend course to 10 days.  She will follow-up with she continues to have pain. ? ?  ?  ? Relevant Medications  ? amoxicillin-clavulanate (AUGMENTIN) 875-125 MG tablet  ?  ? Other  ? Hyperlipidemia (Chronic)  ?  Was unable to check her lipid panel today as she was a difficult stick.  Recommend checking at her next visit. ? ?  ?  ? Preventative health care  ?  DXA scan ordered ? ?Referral to GI for colonoscopy ? ?She will see pharmacy for age-appropriate pneumonia and tetanus vaccines. ? ? ?  ?  ? Beta thalassemia (HCC)  ?  Reports history of beta thalassemia.  Has required very infrequent transfusions in the past.  She reports last infusion was prior to 2000.  She does reports she is feeling more tired lately and feels tired when waking.  She denies any OSA type symptoms.  Denies any bleeding.  She has normal cap refill rates appear pain.  CBC today with hemoglobin of 12.4 which is stable from 7 months ago at 12.1.  MCV is very slightly decreased at 79.  Platelets are elevated at 466 today.  This may be reactive in setting of concurrent otitis media.  Unfortunately were not able to get enough blood from her to obtain other labs.  Would recommend checking iron labs at a future lab visit and TSH to further evaluate if she continues to feel fatigued.   ? ?  ?  ? Chest pain  ?  She reports 3 weeks of chest pain.  Describes it as a gnawing rating pulling sensation in the left chest.  He is pretty physically active and does much cleaning at church.  However she does not feel she strained herself.  Pain is intermittent both at rest and with activity.  Has not noticed any exacerbating or relieving factors.  She is not quite sure how long the pain lasts.  There is no associated shortness of breath.  Has had a cath in 2012  which did not show any CAD.  EKG today with normal sinus rhythm.  She continues to have T wave inversions in V1 and V3 which are also present on prior EKG.  Does have small Q waves in leads I aVL which are

## 2021-09-02 NOTE — Assessment & Plan Note (Addendum)
A1c of 7.6 today up from prior.  She does not regularly check blood glucose at home.  She is only on metformin 1000 mg twice daily.  She attributes increased sugars and that she is eating raisin bread lately instead of her usual whole-wheat bread.  We discussed her monitoring her diet and trying to avoid frequent consumption of dried fruits as they may contain large amounts of sugar.  We discussed that she should try to incorporate more whole grains, fresh fruits and vegetables, and lean proteins in her diet.  We will continue on her metformin and recheck A1c in 3 months.  She deferred foot exam today but reports no lesions on her feet. ?

## 2021-09-02 NOTE — Assessment & Plan Note (Addendum)
She reports 3 weeks of chest pain.  Describes it as a gnawing rating pulling sensation in the left chest.  He is pretty physically active and does much cleaning at church.  However she does not feel she strained herself.  Pain is intermittent both at rest and with activity.  Has not noticed any exacerbating or relieving factors.  She is not quite sure how long the pain lasts.  There is no associated shortness of breath.  Has had a cath in 2012 which did not show any CAD.  EKG today with normal sinus rhythm.  She continues to have T wave inversions in V1 and V3 which are also present on prior EKG.  Does have small Q waves in leads I aVL which are new although prior Q waves in 1-3 and aVF are no longer present.  No significant ST elevations.  On exam she does have a split S2 otherwise no murmurs.  Will order echo and refer to cardiology for possible stress test.  We will have her follow-up in 1 month.  She is also feeling more fatigued lately.  She reports a history of thalassemia.  Please see beta thalassemia for more details. ?

## 2021-09-02 NOTE — Assessment & Plan Note (Signed)
Reports history of beta thalassemia.  Has required very infrequent transfusions in the past.  She reports last infusion was prior to 2000.  She does reports she is feeling more tired lately and feels tired when waking.  She denies any OSA type symptoms.  Denies any bleeding.  She has normal cap refill rates appear pain.  CBC today with hemoglobin of 12.4 which is stable from 7 months ago at 12.1.  MCV is very slightly decreased at 79.  Platelets are elevated at 466 today.  This may be reactive in setting of concurrent otitis media.  Unfortunately were not able to get enough blood from her to obtain other labs.  Would recommend checking iron labs at a future lab visit and TSH to further evaluate if she continues to feel fatigued.   ?

## 2021-09-02 NOTE — Progress Notes (Signed)
Internal Medicine Clinic Attending ? ?Case discussed with Dr. Liang  At the time of the visit.  We reviewed the resident?s history and exam and pertinent patient test results.  I agree with the assessment, diagnosis, and plan of care documented in the resident?s note. ? ?

## 2021-09-02 NOTE — Assessment & Plan Note (Signed)
DXA scan ordered ? ?Referral to GI for colonoscopy ? ?She will see pharmacy for age-appropriate pneumonia and tetanus vaccines. ? ?

## 2021-09-02 NOTE — Assessment & Plan Note (Signed)
Was unable to check her lipid panel today as she was a difficult stick.  Recommend checking at her next visit. ?

## 2021-09-13 ENCOUNTER — Other Ambulatory Visit: Payer: Self-pay | Admitting: Internal Medicine

## 2021-09-13 DIAGNOSIS — Q453 Other congenital malformations of pancreas and pancreatic duct: Secondary | ICD-10-CM

## 2021-09-15 ENCOUNTER — Telehealth: Payer: Self-pay

## 2021-09-15 NOTE — Telephone Encounter (Signed)
Call to pharmacy- Prescription for Promethazine has been filled and pick up. ?

## 2021-09-15 NOTE — Telephone Encounter (Signed)
promethazine (PHENERGAN) 12.5 MG tablet, refill request @ [facility], [place]. ?

## 2021-09-22 DIAGNOSIS — K861 Other chronic pancreatitis: Secondary | ICD-10-CM | POA: Diagnosis not present

## 2021-09-22 DIAGNOSIS — Z5181 Encounter for therapeutic drug level monitoring: Secondary | ICD-10-CM | POA: Diagnosis not present

## 2021-09-22 DIAGNOSIS — R1012 Left upper quadrant pain: Secondary | ICD-10-CM | POA: Diagnosis not present

## 2021-09-22 DIAGNOSIS — Z79899 Other long term (current) drug therapy: Secondary | ICD-10-CM | POA: Diagnosis not present

## 2021-10-14 ENCOUNTER — Other Ambulatory Visit: Payer: Self-pay | Admitting: Internal Medicine

## 2021-10-14 DIAGNOSIS — Q453 Other congenital malformations of pancreas and pancreatic duct: Secondary | ICD-10-CM

## 2021-10-15 NOTE — Telephone Encounter (Signed)
Pt wanting to know why her medication has not been called in yet for the following:  promethazine (PHENERGAN) 12.5 MG tablet  [id number] - [place], [place] - [address] AT [address]

## 2021-11-17 ENCOUNTER — Other Ambulatory Visit: Payer: Self-pay | Admitting: Internal Medicine

## 2021-11-17 DIAGNOSIS — K861 Other chronic pancreatitis: Secondary | ICD-10-CM | POA: Diagnosis not present

## 2021-11-17 DIAGNOSIS — R1012 Left upper quadrant pain: Secondary | ICD-10-CM | POA: Diagnosis not present

## 2021-11-17 DIAGNOSIS — Q453 Other congenital malformations of pancreas and pancreatic duct: Secondary | ICD-10-CM

## 2021-12-15 ENCOUNTER — Ambulatory Visit (HOSPITAL_COMMUNITY): Payer: Medicare HMO

## 2021-12-17 ENCOUNTER — Other Ambulatory Visit: Payer: Self-pay | Admitting: Internal Medicine

## 2021-12-17 DIAGNOSIS — Q453 Other congenital malformations of pancreas and pancreatic duct: Secondary | ICD-10-CM

## 2021-12-29 ENCOUNTER — Ambulatory Visit (HOSPITAL_COMMUNITY): Payer: Medicare HMO | Attending: Student in an Organized Health Care Education/Training Program

## 2021-12-31 ENCOUNTER — Ambulatory Visit (INDEPENDENT_AMBULATORY_CARE_PROVIDER_SITE_OTHER): Payer: Medicare HMO

## 2021-12-31 DIAGNOSIS — Z0000 Encounter for general adult medical examination without abnormal findings: Secondary | ICD-10-CM | POA: Diagnosis not present

## 2021-12-31 NOTE — Progress Notes (Unsigned)
Subjective:   [name] is a 66 y.o. female who presents for an Initial Medicare Annual Wellness Visit. I connected with  Denece Shearer on 12/31/21 by a audio enabled telemedicine application and verified that I am speaking with the correct person using two identifiers.  Patient Location: Home  Provider Location: Home Office  I discussed the limitations of evaluation and management by telemedicine. The patient expressed understanding and agreed to proceed.  Review of Systems    Defer to PCP       Objective:    Today's Vitals   12/31/21 0949  PainSc: 8    There is no height or weight on file to calculate BMI.     12/31/2021    9:51 AM 01/26/2021    9:09 AM 05/08/2020   10:17 AM 12/25/2019    1:19 PM 04/08/2019    9:22 AM 01/14/2019    9:15 AM 11/06/2018    8:45 AM  Advanced Directives  Does Patient Have a Medical Advance Directive? No No No No No No No  Would patient like information on creating a medical advance directive? No - Patient declined No - Patient declined No - Patient declined No - Patient declined Yes (MAU/Ambulatory/Procedural Areas - Information given) No - Patient declined No - Patient declined    Current Medications (verified) Outpatient Encounter Medications as of 12/31/2021  Medication Sig   acetaminophen (TYLENOL) 325 MG tablet Take 2 tablets (650 mg total) by mouth every 6 (six) hours as needed for mild pain (or Fever >/= 101).   calcium carbonate (OS-CAL) 1250 (500 Ca) MG chewable tablet Chew by mouth.   calcium carbonate (TUMS - DOSED IN MG ELEMENTAL CALCIUM) 500 MG chewable tablet Chew 1 tablet by mouth daily as needed for indigestion or heartburn.   Cholecalciferol (VITAMIN D) 50 MCG (2000 UT) CAPS Take 1 capsule (2,000 Units total) by mouth daily.   CREON 36000 units CPEP capsule TAKE 2 CAPSULES BY MOUTH WITH EACH MEAL ABDOMINAL 1 CAPSULE WITH EACH SNACK   fluticasone (FLONASE) 50 MCG/ACT nasal spray Place 2 sprays into both nostrils  daily.   lisinopril-hydrochlorothiazide (ZESTORETIC) 20-12.5 MG tablet Take 1 tablet by mouth daily.   metFORMIN (GLUCOPHAGE) 1000 MG tablet TAKE 1 TABLET(1000 MG) BY MOUTH TWICE DAILY WITH A MEAL   morphine (MS CONTIN) 30 MG 12 hr tablet TK 1 T PO  Q 12 H   nabumetone (RELAFEN) 750 MG tablet Take 1 tablet (750 mg total) by mouth 2 (two) times daily as needed.   pantoprazole (PROTONIX) 40 MG tablet Take 1 tablet (40 mg total) by mouth daily.   pravastatin (PRAVACHOL) 10 MG tablet Take 2 tablets (20 mg total) by mouth daily.   promethazine (PHENERGAN) 12.5 MG tablet TAKE 1 TABLET BY MOUTH EVERY 8 HOURS AS NEEDED FOR NAUSEA OR VOMITING   No facility-administered encounter medications on file as of 12/31/2021.    Allergies (verified) Butalbital-aspirin-caffeine, Fioricet [butalbital-apap-caffeine], Shellfish-derived products, Shrimp flavor, Sulfonamide derivatives, Aspirin, and Sulfamethoxazole   History: Past Medical History:  Diagnosis Date   Beta thalassemia (HCC)    Blood dyscrasia    thalasemia, sickle cell trait   Childhood asthma    Chronic abdominal pain    Unclear etiology, thought to be due to chronic pancreatitis previously in setting of her pancreatic divisum - however, EUS and EGD performed at [place]  (10/05/2011) showing normal esophageall, gastric, duodenal mucosa. EUS showing no pancreatic masses, cysts, or changes of chronic pancreatitis. Biliary system nondilated and  had no endosonographic abnormalities.   Colon cancer screening 05/23/2016   CVA (cerebral vascular accident) [facility]) 1993   Per report by patient she had a stroke and prolonged rehab course; still "weak on left side but not much" (03/29/2017)   Encounter for screening mammogram for breast cancer 12/02/2016   Family history of adverse reaction to anesthesia    "mom took a long while to wake up; she was allergic to it" (03/29/2017)   History of blood transfusion 1980s   "related to minor sickle cell crisis"  (03/29/2017)   HLD (hyperlipidemia) 2009   Hypertension    Iron deficiency anemia 11/16/2011   Migraine headache    "q couple years now" (03/29/2017)   Palpitation 07/18/2016   Pancreatic divisum    S/P ERCP with stenting 07/2010 at [facility], then stent removal (08/05/2010)   Pneumonia    "had it in my teens 3 times" (03/29/2017)   Pulmonary embolus ([place]) 08/2004   Two areas of V/Q mismatch. Findings compatible  with high probability for pulmonary embolus.; pt was on coumadin for 1 year   Right calf pain 05/18/2018   Seizures ([place]) 1993   "after the stroke in 1993; none for years now"  (03/29/2017)   Sickle cell trait ([place])    Thalassemia    Type II diabetes mellitus ([place]) 2010   well controlled   Uterine cancer [facility])    Past Surgical History:  Procedure Laterality Date   ABDOMINAL HYSTERECTOMY  1980   2/2 endometriosis   APPENDECTOMY  ?1980   "I think I've had it out" (11/20/2012)   BREAST LUMPECTOMY Bilateral 1980's   "in my milk ducts; both benign" (02/27/2012)   CORONARY ANGIOGRAM Bilateral 03/17/2011   Procedure: CORONARY ANGIOGRAM;  Surgeon: Jettie Booze, MD;  Location: [facility] CATH LAB;  Service: Cardiovascular;  Laterality: Bilateral;   ERCP  07/2010   w/stent placement  at [facility], then stent removal (08/05/2010)   ESOPHAGOGASTRODUODENOSCOPY N/A 10/26/2013   Procedure: ESOPHAGOGASTRODUODENOSCOPY (EGD);  Surgeon: Jerene Bears, MD;  Location: [facility] ENDOSCOPY;  Service: Endoscopy;  Laterality: N/A;   Pancreatic stent placement/removal     placed in 2011; removed in 2012/H&P (02/27/2012); "I've had several" (03/29/2017)   SPHINCTEROTOMY     Archie Endo 11/20/2012   Family History  Problem Relation Age of Onset   Prostate cancer Father    Cancer Father        stomach ca died x 1 year ago   Sickle cell anemia Brother    Lung cancer Maternal Aunt    Lung cancer Maternal Uncle    Breast cancer Maternal Grandmother    Pancreatic cancer Maternal Grandmother    Heart disease  Maternal Grandfather    Heart disease Paternal Grandfather    Diabetes Other    Cancer Other        multiple cancers pancreas,colon, breast   Social History   Socioeconomic History   Marital status: Married    Spouse name: Not on file   Number of children: Not on file   Years of education: 12   Highest education level: Not on file  Occupational History    Employer: UNEMPLOYED  Tobacco Use   Smoking status: Former    Packs/day: 0.50    Years: 30.00    Total pack years: 15.00    Types: Cigarettes    Quit date: 01/01/1999    Years since quitting: 23.0   Smokeless tobacco: Never  Vaping Use   Vaping Use: Never used  Substance  and Sexual Activity   Alcohol use: No    Alcohol/week: 0.0 standard drinks of alcohol   Drug use: No   Sexual activity: Not Currently    Partners: Male  Other Topics Concern   Not on file  Social History Narrative   Worked at [employer] as a Librarian, academic, on Retail banker about 9 hours/ day   Social Determinants of Health   Financial Resource Strain: Low Risk  (12/31/2021)   Overall Financial Resource Strain (CARDIA)    Difficulty of Paying Living Expenses: Not hard at all  Food Insecurity: No Food Insecurity (12/31/2021)   Hunger Vital Sign    Worried About Running Out of Food in the Last Year: Never true    [place] in the Last Year: Never true  Transportation Needs: No Transportation Needs (12/31/2021)   PRAPARE - Hydrologist (Medical): No    Lack of Transportation (Non-Medical): No  Physical Activity: Inactive (12/31/2021)   Exercise Vital Sign    Days of Exercise per Week: 0 days    Minutes of Exercise per Session: 0 min  Stress: No Stress Concern Present (12/31/2021)   [place]    Feeling of Stress : Not at all  Social Connections: [place] (12/31/2021)   Social Connection and Isolation Panel [NHANES]    Frequency of Communication with  Friends and Family: More than three times a week    Frequency of Social Gatherings with Friends and Family: More than three times a week    Attends Religious Services: More than 4 times per year    Active Member of [employer] or Organizations: Yes    Attends Music therapist: More than 4 times per year    Marital Status: Married    Tobacco Counseling Counseling given: Not Answered   Clinical Intake:  Pre-visit preparation completed: Yes  Pain : 0-10 Pain Score: 8  Pain Location: Abdomen Pain Orientation: Left Pain Radiating Towards: back Pain Descriptors / Indicators: Sharp, Cramping Pain Onset: In the past 7 days Pain Frequency: Constant Pain Relieving Factors: motrin  Pain Relieving Factors: motrin  Nutritional Risks: None Diabetes: Yes  How often do you need to have someone help you when you read instructions, pamphlets, or other written materials from your doctor or pharmacy?: 1 - Never What is the last grade level you completed in school?: 12th grade  Diabetic?Yes  Interpreter Needed?: No  Information entered by :: Corey Skains Kallee Nam,cma 12/31/21 9:50am   Activities of Daily Living    12/31/2021    9:51 AM 09/01/2021    9:45 AM  In your present state of health, do you have any difficulty performing the following activities:  Hearing? 0 0  Vision? 1 0  Difficulty concentrating or making decisions? 0 0  Walking or climbing stairs? 0 0  Dressing or bathing? 0 0  Doing errands, shopping? 0 0    Patient Care Team: Dorethea Clan, DO as PCP - General Thelma Comp, OD as Consulting Physician (Optometry) [place], Nira Conn, OD (Optometry)  Indicate any recent Medical Services you may have received from other than [facility] providers in the past year (date may be approximate).     Assessment:   This is a routine wellness examination for Che[place].  Hearing/Vision screen No results found.  Dietary issues and exercise activities discussed:     Goals Addressed    None   Depression Screen    12/31/2021  9:51 AM 09/01/2021    9:55 AM 05/08/2020   10:16 AM 12/25/2019    1:24 PM 04/08/2019    9:27 AM 06/14/2018    1:24 PM 05/17/2018   11:18 AM  PHQ 2/9 Scores  PHQ - 2 Score 0 0 0 0 0 0 0  PHQ- 9 Score   0  0 0 0    Fall Risk    12/31/2021    9:50 AM 09/01/2021    9:43 AM 08/24/2021    2:56 PM 01/26/2021    9:08 AM 05/08/2020   10:16 AM  Fall Risk   Falls in the past year? 0 0 0 0 0  Number falls in past yr: 0 0 0 0   Injury with Fall? 0 0 0 0   Risk for fall due to : No Fall Risks No Fall Risks No Fall Risks No Fall Risks No Fall Risks  Follow up Falls prevention discussed;Falls evaluation completed Falls evaluation completed Falls evaluation completed Falls prevention discussed;Falls evaluation completed     FALL RISK PREVENTION PERTAINING TO THE HOME:  Any stairs in or around the home? No  If so, are there any without handrails? No  Home free of loose throw rugs in walkways, pet beds, electrical cords, etc? Yes  Adequate lighting in your home to reduce risk of falls? Yes   ASSISTIVE DEVICES UTILIZED TO PREVENT FALLS:  Life alert? No  Use of a cane, walker or w/c? No  Grab bars in the bathroom? No  Shower chair or bench in shower? No  Elevated toilet seat or a handicapped toilet? Yes   TIMED UP AND GO:  Was the test performed? No .  Length of time to ambulate 10 feet: N/A sec.   Gait slow and steady without use of assistive device  Cognitive Function:        12/31/2021   10:03 AM  6CIT Screen  What Year? 0 points  What month? 0 points  What time? 0 points  Count back from 20 0 points  Months in reverse 0 points  Repeat phrase 0 points  Total Score 0 points    Immunizations Immunization History  Administered Date(s) Administered   Influenza Split 03/14/2012   Influenza Whole 02/13/2007, 01/21/2009, 03/16/2010   Influenza, High Dose Seasonal PF 03/20/2019   Influenza,inj,Quad PF,6+ Mos 01/16/2013, 01/16/2014, 02/18/2016,  02/24/2017, 01/18/2018   Influenza-Unspecified 03/03/2011   PFIZER(Purple Top)SARS-COV-2 Vaccination 07/01/2019, 07/31/2019   Pneumococcal Conjugate-13 04/08/2019   Pneumococcal Polysaccharide-23 02/17/2004, 12/12/2007, 03/03/2011   Tdap 04/07/2011    TDAP status: Due, Education has been provided regarding the importance of this vaccine. Advised may receive this vaccine at local pharmacy or Health Dept. Aware to provide a copy of the vaccination record if obtained from local pharmacy or Health Dept. Verbalized acceptance and understanding.  Flu Vaccine status: Due, Education has been provided regarding the importance of this vaccine. Advised may receive this vaccine at local pharmacy or Health Dept. Aware to provide a copy of the vaccination record if obtained from local pharmacy or Health Dept. Verbalized acceptance and understanding.  Pneumococcal vaccine status: Due, Education has been provided regarding the importance of this vaccine. Advised may receive this vaccine at local pharmacy or Health Dept. Aware to provide a copy of the vaccination record if obtained from local pharmacy or Health Dept. Verbalized acceptance and understanding.  Covid-19 vaccine status: Completed vaccines  Qualifies for Shingles Vaccine? No   Zostavax completed No   Shingrix Completed?: No.  Education has been provided regarding the importance of this vaccine. Patient has been advised to call insurance company to determine out of pocket expense if they have not yet received this vaccine. Advised may also receive vaccine at local pharmacy or Health Dept. Verbalized acceptance and understanding.  Screening Tests Health Maintenance  Topic Date Due   Zoster Vaccines- Shingrix (1 of 2) Never done   DEXA SCAN  Never done   MAMMOGRAM  01/28/2019   COVID-19 Vaccine (3 - Pfizer risk series) 08/28/2019   Pneumonia Vaccine 37+ Years old (3 - PPSV23 or PCV20) 04/07/2020   LIPID PANEL  12/24/2020   TETANUS/TDAP   04/06/2021   FOOT EXAM  04/08/2021   COLONOSCOPY (Pts 45-104yr Insurance coverage will need to be confirmed)  05/02/2021   OPHTHALMOLOGY EXAM  10/16/2021   INFLUENZA VACCINE  11/30/2021   HEMOGLOBIN A1C  12/02/2021   Hepatitis C Screening  Completed   HPV VACCINES  Aged Out    Health Maintenance  Health Maintenance Due  Topic Date Due   Zoster Vaccines- Shingrix (1 of 2) Never done   DEXA SCAN  Never done   MAMMOGRAM  01/28/2019   COVID-19 Vaccine (3 - Pfizer risk series) 08/28/2019   Pneumonia Vaccine 68 Years old (3 - PPSV23 or PCV20) 04/07/2020   LIPID PANEL  12/24/2020   TETANUS/TDAP  04/06/2021   FOOT EXAM  04/08/2021   COLONOSCOPY (Pts 45-458yrInsurance coverage will need to be confirmed)  05/02/2021   OPHTHALMOLOGY EXAM  10/16/2021   INFLUENZA VACCINE  11/30/2021   HEMOGLOBIN A1C  12/02/2021    Colorectal cancer screening: Type of screening: Colonoscopy. Completed 02/27/2004. Repeat every 10 years. Patient is due .  Mammogram status: Completed 01/17/2017. Repeat every year: every 2 years. Patient is due.    Lung Cancer Screening: (Low Dose CT Chest recommended if Age 122-80ears, 30 pack-year currently smoking OR have quit w/in 15years.) does not qualify.   Lung Cancer Screening Referral: N/A  Additional Screening:  Hepatitis C Screening: does not qualify; Completed 06/14/2018  Vision Screening: Recommended annual ophthalmology exams for early detection of glaucoma and other disorders of the eye. Is the patient up to date with their annual eye exam?  Yes  Who is the provider or what is the name of the office in which the patient attends annual eye exams? [facility] If pt is not established with a provider, would they like to be referred to a provider to establish care? No .   Dental Screening: Recommended annual dental exams for proper oral hygiene  Community Resource Referral / Chronic Care Management: CRR required this visit?  No   CCM required this  visit?  No      Plan:     I have personally reviewed and noted the following in the patient's chart:   Medical and social history Use of alcohol, tobacco or illicit drugs  Current medications and supplements including opioid prescriptions. Patient is currently taking opioid prescriptions. Information provided to patient regarding non-opioid alternatives. Patient advised to discuss non-opioid treatment plan with their provider. Functional ability and status Nutritional status Physical activity Advanced directives List of other physicians Hospitalizations, surgeries, and ER visits in previous 12 months Vitals Screenings to include cognitive, depression, and falls Referrals and appointments  In addition, I have reviewed and discussed with patient certain preventive protocols, quality metrics, and best practice recommendations. A written personalized care plan for preventive services as well as general preventive health recommendations were provided  to patient.     Kerin Perna, [place]   12/31/2021   Nurse Notes: Non Face-to-Face 10 minute visit  [name] , Thank you for taking time to come for your Medicare Wellness Visit. I appreciate your ongoing commitment to your health goals. Please review the following plan we discussed and let me know if I can assist you in the future.   These are the goals we discussed:  Goals      Blood Pressure < 140/90     HEMOGLOBIN A1C < 7.0     HEMOGLOBIN A1C < 7.0     LDL CALC < 100        This is a list of the screening recommended for you and due dates:  Health Maintenance  Topic Date Due   Zoster (Shingles) Vaccine (1 of 2) Never done   DEXA scan (bone density measurement)  Never done   Mammogram  01/28/2019   COVID-19 Vaccine (3 - Pfizer risk series) 08/28/2019   Pneumonia Vaccine (3 - PPSV23 or PCV20) 04/07/2020   Lipid (cholesterol) test  12/24/2020   Tetanus Vaccine  04/06/2021   Complete foot exam   04/08/2021   Colon  Cancer Screening  05/02/2021   Eye exam for diabetics  10/16/2021   Flu Shot  11/30/2021   Hemoglobin A1C  12/02/2021   Hepatitis C Screening: USPSTF Recommendation to screen - Ages 18-79 yo.  Completed   HPV Vaccine  Aged Out

## 2022-01-17 ENCOUNTER — Other Ambulatory Visit: Payer: Self-pay | Admitting: Internal Medicine

## 2022-01-17 DIAGNOSIS — Q453 Other congenital malformations of pancreas and pancreatic duct: Secondary | ICD-10-CM

## 2022-01-20 NOTE — Progress Notes (Signed)
I reviewed the AWV findings with the provider who conducted the visit. I was present in the office suite and immediately available to provide assistance and direction throughout the time the service was provided.  

## 2022-01-25 DIAGNOSIS — M5412 Radiculopathy, cervical region: Secondary | ICD-10-CM | POA: Diagnosis not present

## 2022-01-25 DIAGNOSIS — M542 Cervicalgia: Secondary | ICD-10-CM | POA: Diagnosis not present

## 2022-01-25 DIAGNOSIS — Z79899 Other long term (current) drug therapy: Secondary | ICD-10-CM | POA: Diagnosis not present

## 2022-01-25 DIAGNOSIS — K861 Other chronic pancreatitis: Secondary | ICD-10-CM | POA: Diagnosis not present

## 2022-01-25 DIAGNOSIS — Z5181 Encounter for therapeutic drug level monitoring: Secondary | ICD-10-CM | POA: Diagnosis not present

## 2022-01-25 DIAGNOSIS — R1012 Left upper quadrant pain: Secondary | ICD-10-CM | POA: Diagnosis not present

## 2022-01-31 ENCOUNTER — Telehealth: Payer: Self-pay | Admitting: Internal Medicine

## 2022-01-31 NOTE — Telephone Encounter (Signed)
Returned call to patient. C/o 2 day hx of left middle back pain radiating to LUQ. "I think I'm having a flare of pancreatitis." Notes nausea, and "fullness." Denies fever/chills, vomiting, changes in urination or BMs. First available appt given for 10/4 at [place]. She is aware to head directly to ED/UC if develops fever, inability to eat, severe vomiting or any other concerning sx.

## 2022-01-31 NOTE — Telephone Encounter (Signed)
Pt requesting a call back.  Pt states she is having back pain.  Pt states she would like to know what she can do as she can not wait for an appointment for next week.

## 2022-02-02 ENCOUNTER — Ambulatory Visit (INDEPENDENT_AMBULATORY_CARE_PROVIDER_SITE_OTHER): Payer: Medicare HMO

## 2022-02-02 ENCOUNTER — Encounter (HOSPITAL_COMMUNITY): Payer: Self-pay | Admitting: Internal Medicine

## 2022-02-02 ENCOUNTER — Observation Stay (HOSPITAL_COMMUNITY)
Admission: RE | Admit: 2022-02-02 | Discharge: 2022-02-03 | Disposition: A | Discharge status: Home / Self Care | Payer: Medicare HMO | Attending: Internal Medicine | Admitting: Internal Medicine

## 2022-02-02 ENCOUNTER — Other Ambulatory Visit: Payer: Self-pay

## 2022-02-02 ENCOUNTER — Encounter (HOSPITAL_COMMUNITY): Payer: Self-pay

## 2022-02-02 VITALS — BP 140/75 | HR 71 | Temp 98.2°F | Ht 60.0 in | Wt 100.9 lb

## 2022-02-02 DIAGNOSIS — Z8541 Personal history of malignant neoplasm of cervix uteri: Secondary | ICD-10-CM | POA: Diagnosis not present

## 2022-02-02 DIAGNOSIS — R109 Unspecified abdominal pain: Secondary | ICD-10-CM | POA: Diagnosis not present

## 2022-02-02 DIAGNOSIS — G8929 Other chronic pain: Secondary | ICD-10-CM | POA: Diagnosis not present

## 2022-02-02 DIAGNOSIS — R140 Abdominal distension (gaseous): Secondary | ICD-10-CM | POA: Diagnosis not present

## 2022-02-02 DIAGNOSIS — R1012 Left upper quadrant pain: Secondary | ICD-10-CM | POA: Insufficient documentation

## 2022-02-02 DIAGNOSIS — Z86711 Personal history of pulmonary embolism: Secondary | ICD-10-CM | POA: Insufficient documentation

## 2022-02-02 DIAGNOSIS — E7849 Other hyperlipidemia: Secondary | ICD-10-CM

## 2022-02-02 DIAGNOSIS — E119 Type 2 diabetes mellitus without complications: Secondary | ICD-10-CM | POA: Insufficient documentation

## 2022-02-02 DIAGNOSIS — Z23 Encounter for immunization: Secondary | ICD-10-CM | POA: Diagnosis not present

## 2022-02-02 DIAGNOSIS — I10 Essential (primary) hypertension: Secondary | ICD-10-CM | POA: Diagnosis not present

## 2022-02-02 DIAGNOSIS — K861 Other chronic pancreatitis: Secondary | ICD-10-CM | POA: Diagnosis present

## 2022-02-02 DIAGNOSIS — Z79899 Other long term (current) drug therapy: Secondary | ICD-10-CM | POA: Diagnosis not present

## 2022-02-02 DIAGNOSIS — Z7984 Long term (current) use of oral hypoglycemic drugs: Secondary | ICD-10-CM | POA: Insufficient documentation

## 2022-02-02 DIAGNOSIS — J45909 Unspecified asthma, uncomplicated: Secondary | ICD-10-CM | POA: Diagnosis not present

## 2022-02-02 DIAGNOSIS — Z8673 Personal history of transient ischemic attack (TIA), and cerebral infarction without residual deficits: Secondary | ICD-10-CM | POA: Diagnosis not present

## 2022-02-02 DIAGNOSIS — R1013 Epigastric pain: Secondary | ICD-10-CM | POA: Diagnosis not present

## 2022-02-02 DIAGNOSIS — E1165 Type 2 diabetes mellitus with hyperglycemia: Secondary | ICD-10-CM

## 2022-02-02 DIAGNOSIS — Z0000 Encounter for general adult medical examination without abnormal findings: Secondary | ICD-10-CM

## 2022-02-02 DIAGNOSIS — Q453 Other congenital malformations of pancreas and pancreatic duct: Secondary | ICD-10-CM

## 2022-02-02 HISTORY — DX: Unspecified abdominal pain: R10.9

## 2022-02-02 LAB — URINALYSIS, ROUTINE W REFLEX MICROSCOPIC
Bacteria, UA: NONE SEEN
Bilirubin Urine: NEGATIVE
Glucose, UA: NEGATIVE mg/dL
Hgb urine dipstick: NEGATIVE
Ketones, ur: NEGATIVE mg/dL
Leukocytes,Ua: NEGATIVE
Nitrite: NEGATIVE
Protein, ur: NEGATIVE mg/dL
Specific Gravity, Urine: 1.008 (ref 1.005–1.030)
pH: 6 (ref 5.0–8.0)

## 2022-02-02 LAB — CBC WITH DIFFERENTIAL/PLATELET
Abs Immature Granulocytes: 0.01 10*3/uL (ref 0.00–0.07)
Basophils Absolute: 0 10*3/uL (ref 0.0–0.1)
Basophils Relative: 1 %
Eosinophils Absolute: 0.1 10*3/uL (ref 0.0–0.5)
Eosinophils Relative: 1 %
HCT: 37.1 % (ref 36.0–46.0)
Hemoglobin: 12.3 g/dL (ref 12.0–15.0)
Immature Granulocytes: 0 %
Lymphocytes Relative: 42 %
Lymphs Abs: 2.8 10*3/uL (ref 0.7–4.0)
MCH: 26.3 pg (ref 26.0–34.0)
MCHC: 33.2 g/dL (ref 30.0–36.0)
MCV: 79.4 fL — ABNORMAL LOW (ref 80.0–100.0)
Monocytes Absolute: 0.5 10*3/uL (ref 0.1–1.0)
Monocytes Relative: 8 %
Neutro Abs: 3.2 10*3/uL (ref 1.7–7.7)
Neutrophils Relative %: 48 %
Platelets: 389 10*3/uL (ref 150–400)
RBC: 4.67 MIL/uL (ref 3.87–5.11)
RDW: 14.2 % (ref 11.5–15.5)
WBC: 6.5 10*3/uL (ref 4.0–10.5)
nRBC: 0 % (ref 0.0–0.2)

## 2022-02-02 LAB — COMPREHENSIVE METABOLIC PANEL
ALT: 18 U/L (ref 0–44)
AST: 23 U/L (ref 15–41)
Albumin: 3.9 g/dL (ref 3.5–5.0)
Alkaline Phosphatase: 57 U/L (ref 38–126)
Anion gap: 12 (ref 5–15)
BUN: 10 mg/dL (ref 8–23)
CO2: 28 mmol/L (ref 22–32)
Calcium: 10.1 mg/dL (ref 8.9–10.3)
Chloride: 101 mmol/L (ref 98–111)
Creatinine, Ser: 0.8 mg/dL (ref 0.44–1.00)
GFR, Estimated: >60 mL/min (ref >60)
Glucose, Bld: 139 mg/dL — ABNORMAL HIGH (ref 70–99)
Potassium: 4 mmol/L (ref 3.5–5.1)
Sodium: 141 mmol/L (ref 135–145)
Total Bilirubin: 0.5 mg/dL (ref 0.3–1.2)
Total Protein: 7.5 g/dL (ref 6.5–8.1)

## 2022-02-02 LAB — RAPID URINE DRUG SCREEN, HOSP PERFORMED
Amphetamines: NOT DETECTED
Barbiturates: NOT DETECTED
Benzodiazepines: NOT DETECTED
Cocaine: NOT DETECTED
Opiates: POSITIVE — AB
Tetrahydrocannabinol: NOT DETECTED

## 2022-02-02 LAB — GLUCOSE, CAPILLARY
Glucose-Capillary: 159 mg/dL — ABNORMAL HIGH (ref 70–99)
Glucose-Capillary: 97 mg/dL (ref 70–99)

## 2022-02-02 LAB — POCT GLYCOSYLATED HEMOGLOBIN (HGB A1C): Hemoglobin A1C: 8.3 % — AB (ref 4.0–5.6)

## 2022-02-02 LAB — ETHANOL: Alcohol, Ethyl (B): <10 mg/dL (ref <10)

## 2022-02-02 LAB — HIV ANTIBODY (ROUTINE TESTING W REFLEX): HIV Screen 4th Generation wRfx: NONREACTIVE

## 2022-02-02 LAB — LIPASE, BLOOD: Lipase: 38 U/L (ref 11–51)

## 2022-02-02 MED ORDER — HYDROMORPHONE HCL 2 MG/ML IJ SOLN
0.5000 mg | INTRAMUSCULAR | Status: DC | PRN
  Administered 2022-02-02: 0.5 mg via INTRAMUSCULAR

## 2022-02-02 MED ORDER — PANCRELIPASE (LIP-PROT-AMYL) 36000-114000 UNITS PO CPEP
36000.0000 [IU] | ORAL_CAPSULE | Freq: Three times a day (TID) | ORAL | Status: DC
  Administered 2022-02-03: 36000 [IU] via ORAL
  Filled 2022-02-02: qty 1

## 2022-02-02 MED ORDER — HYDROMORPHONE HCL 2 MG/ML IJ SOLN
1.0000 mg | INTRAMUSCULAR | Status: DC | PRN
  Administered 2022-02-02: 1 mg via INTRAMUSCULAR

## 2022-02-02 MED ORDER — SODIUM CHLORIDE 0.9 % IV BOLUS
250.0000 mL | Freq: Once | INTRAVENOUS | Status: AC
  Administered 2022-02-02: 250 mL via INTRAVENOUS

## 2022-02-02 MED ORDER — LACTATED RINGERS IV SOLN: INTRAVENOUS | Status: DC

## 2022-02-02 MED ORDER — MORPHINE SULFATE ER 30 MG PO TBCR
30.0000 mg | EXTENDED_RELEASE_TABLET | Freq: Two times a day (BID) | ORAL | Status: DC
  Administered 2022-02-02 – 2022-02-03 (×2): 30 mg via ORAL
  Filled 2022-02-02 (×2): qty 1

## 2022-02-02 MED ORDER — ONDANSETRON HCL 4 MG/2ML IJ SOLN
4.0000 mg | Freq: Four times a day (QID) | INTRAMUSCULAR | Status: DC | PRN
  Administered 2022-02-02 – 2022-02-03 (×3): 4 mg via INTRAVENOUS
  Filled 2022-02-02 (×3): qty 2

## 2022-02-02 MED ORDER — ACETAMINOPHEN 650 MG RE SUPP: 650.0000 mg | Freq: Four times a day (QID) | RECTAL | Status: DC | PRN

## 2022-02-02 MED ORDER — INSULIN ASPART 100 UNIT/ML IJ SOLN: 0.0000 [IU] | Freq: Three times a day (TID) | INTRAMUSCULAR | Status: DC

## 2022-02-02 MED ORDER — ENOXAPARIN SODIUM 40 MG/0.4ML IJ SOSY
40.0000 mg | PREFILLED_SYRINGE | INTRAMUSCULAR | Status: DC
  Administered 2022-02-02: 40 mg via SUBCUTANEOUS
  Filled 2022-02-02: qty 0.4

## 2022-02-02 MED ORDER — HYDROMORPHONE HCL 1 MG/ML IJ SOLN
0.5000 mg | Freq: Once | INTRAMUSCULAR | Status: AC
  Administered 2022-02-02: 0.5 mg via INTRAVENOUS
  Filled 2022-02-02: qty 1

## 2022-02-02 MED ORDER — POLYETHYLENE GLYCOL 3350 17 G PO PACK: 17.0000 g | PACK | Freq: Every day | ORAL | Status: DC | PRN

## 2022-02-02 MED ORDER — ACETAMINOPHEN 325 MG PO TABS: 650.0000 mg | ORAL_TABLET | Freq: Four times a day (QID) | ORAL | Status: DC | PRN

## 2022-02-02 MED ORDER — HYDROMORPHONE HCL 2 MG/ML IJ SOLN: 0.5000 mg | INTRAMUSCULAR | Status: DC | PRN

## 2022-02-02 MED ORDER — SODIUM CHLORIDE 0.9 % IV SOLN
INTRAVENOUS | Status: DC
  Administered 2022-02-02: 80 mL/h via INTRAVENOUS

## 2022-02-02 MED ORDER — HYDROMORPHONE HCL 1 MG/ML IJ SOLN
0.5000 mg | Freq: Four times a day (QID) | INTRAMUSCULAR | Status: DC | PRN
  Administered 2022-02-02 – 2022-02-03 (×3): 0.5 mg via INTRAVENOUS
  Filled 2022-02-02 (×3): qty 1

## 2022-02-02 NOTE — H&P (Signed)
Date: 02/02/2022               Patient Name:  [name] MRN: [id number]  DOB: 1955/11/25 Age / Sex: 66 y.o., female   PCP: Dorethea Clan, DO         Medical Service: Internal Medicine Teaching Service         Attending Physician: Dr. Lucious Groves, DO    First Contact: Dr. Nani Gasser Pager: [phone]  Second Contact: Dr. Gaylan Gerold  Pager: [phone]       After Hours (After 5p/  First Contact Pager: [phone]  weekends / holidays): Second Contact Pager: [phone]   Chief Concern: abdominal pain  History of Present Illness:  [name] is a 66 year old female with past medical history of hypertension, hyperlipidemia, GERD, type 2 diabetes, pancreatic divisum, chronic pancreatitis who admitted from [facility] for abdominal pain.  Pain started on Monday, localized in the epigastric, left upper quadrant and left upper back.  Pain is cramping of social with bloating and belching.  She said that these are typical symptoms of her pancreatitis flareup.  She denies fever, emesis, diarrhea, dysuria, frequency, acid reflux symptoms.  Her last BM was yesterday and was normal, nonbloody.  Normally her pancreatitis flare last about 4-day when she stopped eating but this time it has been gotten progressively worse.  She is taking morphine with Tylenol extra strength 2 tablets every couple hours but no relief.  She was able to tolerate p.o. intake last night and this morning.  She has not had a pancreatitis flare for a long time.  She is unsure of the trigger of her acute flare, could be food related.  She denies any medication change.  She has not been taking her Creon but denies any diarrhea.  She was seen at [facility] this morning.  CBC and CMP was unremarkable.  Lipase was normal.  She was given 1 dose of Dilaudid with IV fluid without relief.   Meds:  Current Facility-Administered Medications for the 02/02/22 encounter [facility] Encounter)  Medication   HYDROmorphone  (DILAUDID) injection 1 mg   Current Meds  Medication Sig   acetaminophen (TYLENOL) 500 MG tablet Take 500 mg by mouth 2 (two) times daily as needed for mild pain or moderate pain.   calcium carbonate (TUMS - DOSED IN MG ELEMENTAL CALCIUM) 500 MG chewable tablet Chew 1 tablet by mouth daily as needed for indigestion or heartburn.   lisinopril-hydrochlorothiazide (ZESTORETIC) 20-12.5 MG tablet Take 1 tablet by mouth daily.   metFORMIN (GLUCOPHAGE) 1000 MG tablet TAKE 1 TABLET(1000 MG) BY MOUTH TWICE DAILY WITH A MEAL (Patient taking differently: Take 1,000 mg by mouth in the morning and at bedtime.)   morphine (MS CONTIN) 30 MG 12 hr tablet Take 30 mg by mouth 2 (two) times daily.   Multiple Vitamin (MULTIVITAMIN PO) Take 1 tablet by mouth daily.   promethazine (PHENERGAN) 12.5 MG tablet TAKE 1 TABLET BY MOUTH EVERY 8 HOURS AS NEEDED FOR NAUSEA OR VOMITING (Patient taking differently: Take 12.5 mg by mouth 2 (two) times daily as needed for nausea or vomiting.)     Allergies: Allergies as of 02/02/2022 - Review Complete 02/02/2022  Allergen Reaction Noted   Butalbital-aspirin-caffeine Anaphylaxis 12/01/2014   Fioricet [butalbital-apap-caffeine] Other (See Comments)    Shellfish-derived products Anaphylaxis 12/01/2014   Shrimp flavor Anaphylaxis 08/25/2010   Sulfonamide derivatives Rash 07/13/2006   Aspirin  11/20/2012   Sulfamethoxazole Rash 12/01/2014   Past Medical History:  Diagnosis  Date   Beta thalassemia (HCC)    Blood dyscrasia    thalasemia, sickle cell trait   Childhood asthma    Chronic abdominal pain    Unclear etiology, thought to be due to chronic pancreatitis previously in setting of her pancreatic divisum - however, EUS and EGD performed at [place]  (10/05/2011) showing normal esophageall, gastric, duodenal mucosa. EUS showing no pancreatic masses, cysts, or changes of chronic pancreatitis. Biliary system nondilated and had no endosonographic abnormalities.   Colon  cancer screening 05/23/2016   CVA (cerebral vascular accident) [facility]) 1993   Per report by patient she had a stroke and prolonged rehab course; still "weak on left side but not much" (03/29/2017)   Encounter for screening mammogram for breast cancer 12/02/2016   Family history of adverse reaction to anesthesia    "mom took a long while to wake up; she was allergic to it" (03/29/2017)   History of blood transfusion 1980s   "related to minor sickle cell crisis" (03/29/2017)   HLD (hyperlipidemia) 2009   Hypertension    Iron deficiency anemia 11/16/2011   Migraine headache    "q couple years now" (03/29/2017)   Palpitation 07/18/2016   Pancreatic divisum    S/P ERCP with stenting 07/2010 at [facility], then stent removal (08/05/2010)   Pneumonia    "had it in my teens 3 times" (03/29/2017)   Pulmonary embolus ([place]) 08/2004   Two areas of V/Q mismatch. Findings compatible  with high probability for pulmonary embolus.; pt was on coumadin for 1 year   Right calf pain 05/18/2018   Seizures ([place]) 1993   "after the stroke in 1993; none for years now"  (03/29/2017)   Sickle cell trait ([place])    Thalassemia    Type II diabetes mellitus ([place]) 2010   well controlled   Uterine cancer ([place])     Family History:  Family History  Problem Relation Age of Onset   Prostate cancer Father    Cancer Father        stomach ca died x 1 year ago   Sickle cell anemia Brother    Lung cancer Maternal Aunt    Lung cancer Maternal Uncle    Breast cancer Maternal Grandmother    Pancreatic cancer Maternal Grandmother    Heart disease Maternal Grandfather    Heart disease Paternal Grandfather    Diabetes Other    Cancer Other        multiple cancers pancreas,colon, breast     Social History:  -Live w husband -take care of husband who has CVA -Has not had alcohol since 29s -No drugs -PCP: Dr. Penny Pia  Review of Systems: A complete ROS was negative except as per HPI.   Physical Exam: Blood pressure 127/60,  pulse 73, temperature 98.5 F (36.9 C), temperature source Oral, resp. rate 17, height 5' (1.524 m), weight 45.5 kg, SpO2 97 %. Physical Exam Constitutional:      General: She is not in acute distress.    Appearance: Normal appearance. She is not toxic-appearing.     Comments: Uncomfortable appearing.  HENT:     Mouth/Throat:     Mouth: Mucous membranes are moist.  Eyes:     General: No scleral icterus.    Conjunctiva/sclera: Conjunctivae normal.  Cardiovascular:     Rate and Rhythm: Normal rate and regular rhythm.     Pulses: Normal pulses.  Pulmonary:     Effort: Pulmonary effort is normal.     Breath sounds: Normal  breath sounds. No stridor.  Abdominal:     General: Bowel sounds are normal. There is no distension.     Palpations: Abdomen is soft.     Tenderness: There is abdominal tenderness (LUQ and epigastric).  Musculoskeletal:     Right lower leg: No edema.     Left lower leg: No edema.  Lymphadenopathy:     Cervical: No cervical adenopathy.  Skin:    General: Skin is warm and dry.     Coloration: Skin is not jaundiced.  Neurological:     Mental Status: She is alert. Mental status is at baseline.  Psychiatric:        Mood and Affect: Mood normal.        Behavior: Behavior normal.    Assessment & Plan by Problem: Principal Problem:   Abdominal pain Active Problems:   Diabetes type 2, controlled (HCC)   Essential hypertension   Congenital anomaly of pancreas   Chronic pancreatitis (HCC)  [name] is a 66 year old female with past medical history of hypertension, hyperlipidemia, GERD, type 2 diabetes, pancreatic divisum, chronic pancreatitis who admitted from [facility] for abdominal pain, working diagnosis is acute on chronic pancreatitis.  Acute abdominal pain Chronic pancreatitis 2/2 pancreatic divisum Patient reports her symptoms are consistent with a typical pancreatitis flare.  Her lipase could be normal in the setting of chronic pancreatitis.  Blood  work unremarkable.  Will obtain CT abdomen/pelvis to evaluate for any inflammatory changes of her pancreas.  Other differential including PUD, pyelonephritis, kidney stone are low on the suspicion list. -Pending CT abdomen/pelvis -IV fluid -Clear liquid diet.  Zofran PRN for nausea -Resume home MS Contin 30 mg BID.  IV Dilaudid for breakthrough pain -Resume Creon -Check UA  Uncontrolled type 2 diabetes A1c of 8.3. -Holding metformin in the setting of poor p.o. intake -Sliding scale insulin for now  Hypertension Holding lisinopril-HCTZ in the setting of poor p.o. intake and low normal blood pressure  Diet: Clear liquid diet Full code DVT: Lovenox IVF: LR  Dispo: Admit patient to Observation with expected length of stay less than 2 midnights.  Signed: Nani Gasser, MD 02/02/2022, 6:34 PM  Pager: [phone] After 5pm on weekdays and 1pm on weekends: On Call pager: [phone]

## 2022-02-02 NOTE — Assessment & Plan Note (Signed)
Patient called the clinic 2 days ago for left middle back pain radiating to LUQ, nausea, increased flatulence, and belching that began the day prior. Patient states that her pain is 10/10 in severity and worsens with sitting up. She states that this is similar to prior episodes of pancreatitis. Patient denies fever, chills, vomiting, dizziness, or changes to her bowel movements. Patient is still able to eat at home but has been eating softer foods like apple sauce for several days. She does state that she has not been able to eat as much as she normally does.   Current medications include acetaminophen 325 MG and morphine 30 mg BID, promethazine 12.5 MG, Creon 36000 UNITS capsules. Patient states that she is compliant with these medications but that the tylenol and morphine are not helping her pain. Patient did not take her morphine prior to her appointment today because it was too early to take this medication. Patient took her dose of promethazine and morphine in clinic today at 9:25 AM. Patient has been hospitalized previously for pancreatitis requiring IV pain medication. She does not drink alcohol and continues to follow up with GI at [facility]. Suspect that patient is having a flare of her chronic pancreatitis.   Plan: - Initially attempted to have the patient admitted at [facility] or [facility] but no beds were available - Discussed that we can treat her pain in the clinic and give her IV fluids - Patient comfortable with this plan - Treated the patient's pain in clinic with 1x dose IM dilaudid 0.5 mg followed by 1X dose of IV dilaudid 1 mg - Ordered 250 cc NS bolus followed by NS infusion at 80 mL/hr for 4 hours - Ordered lipase, CBC, CMP - Patient was admitted to [facility] Internal [facility]

## 2022-02-02 NOTE — Assessment & Plan Note (Signed)
Prior medications include pravastatin 10 MG (2 tablets BID). Patient states that she has not taken that medication in several months due to issues affording this medication. Lipid panel from 12/2019 demonstrates total cholesterol 243, TG 154, and LDL 138.  Plan: - Lipid panel at next visit (unable to perform today due to admission for acute pancreatitis)  - Discuss options for discounted medication at next visit

## 2022-02-02 NOTE — Progress Notes (Signed)
Pain Level past Dilaudid injection 6 per patient.   Recheck pain level 10 plus.  14:10.Report called to [place], RN 5 M .  Patient transported via wheelchair to 5 M 22.  Alert oriented. Iv continues in right arm NS at 80 cc/hr.

## 2022-02-02 NOTE — Plan of Care (Signed)

## 2022-02-02 NOTE — Progress Notes (Signed)
CC: abdominal and back pain  HPI:  [name] is a 66 y.o. female with past medical history of HTN, HLD, GERD, T2DM, congenital anomaly of the pancreas (since 2007), chronic pancreatitis, and chronic abdominal pain that presents for abdominal pain and back pain.   Patient called the clinic 2 days ago for left middle back pain radiating to LUQ, nausea, increased flatulence, and belching that began the day prior. Patient states that her pain is 10/10 in severity and worsens with sitting up. She states that this is similar to prior episodes of pancreatitis. Patient denies fever, chills, vomiting, dizziness, or changes to her bowel movements. Patient is still able to eat at home but has been eating softer foods like apple sauce for several days. She does state that she has not been able to eat as much as she normally does. Current medications include acetaminophen 325 MG and morphine 30 mg BID, promethazine 12.5 MG, Creon 36000 UNITS capsules. Patient states that she is compliant with these medications but that the tylenol and morphine are not helping her pain. Patient did not take her morphine this morning because it was too early to take this medication. Patient has been hospitalized previously for pancreatitis requiring IV pain medication. She does not drink alcohol and continues to follow up with GI at [facility].  Patient has a history of type 2 DM. Current medications include metFORMIN 1000 MG BID. Patient states that she is compliant with this medication but has been eating foods high in sugar due to her diet being limited to pain. Patient denies polyuria, polydipsia, or fatigue. Patient does follow up with ophthalmology for yearly eye exams. Normal diabetic foot exam today.   Patient has a history of hyperlipidemia.  Prior medications include pravastatin 10 MG (2 tablets BID). Patient states that she has not taken that medication in several months due to issues affording this  medication.   Patient is comfortable receiving the influenza vaccine today.   Allergies as of 02/02/2022       Reactions   Butalbital-aspirin-caffeine Anaphylaxis   Fioricet [butalbital-apap-caffeine] Other (See Comments)   Put into a coma state; "it was actually fiorinol; same family" (02/27/2012)   Shellfish-derived Products Anaphylaxis   Shrimp Flavor Anaphylaxis   unknow   Sulfonamide Derivatives Rash   Aspirin    Stomach burning   Sulfamethoxazole Rash        Medication List        Accurate as of February 02, 2022  7:07 AM. If you have any questions, ask your nurse or doctor.          acetaminophen 325 MG tablet Commonly known as: TYLENOL Take 2 tablets (650 mg total) by mouth every 6 (six) hours as needed for mild pain (or Fever >/= 101).   calcium carbonate 1250 (500 Ca) MG chewable tablet Commonly known as: OS-CAL Chew by mouth.   calcium carbonate 500 MG chewable tablet Commonly known as: TUMS - dosed in mg elemental calcium Chew 1 tablet by mouth daily as needed for indigestion or heartburn.   Creon 36000 UNITS Cpep capsule Generic drug: lipase/protease/amylase TAKE 2 CAPSULES BY MOUTH WITH EACH MEAL ABDOMINAL 1 CAPSULE WITH EACH SNACK   fluticasone 50 MCG/ACT nasal spray Commonly known as: FLONASE Place 2 sprays into both nostrils daily.   lisinopril-hydrochlorothiazide 20-12.5 MG tablet Commonly known as: ZESTORETIC Take 1 tablet by mouth daily.   metFORMIN 1000 MG tablet Commonly known as: GLUCOPHAGE TAKE 1 TABLET(1000 MG) BY MOUTH TWICE  DAILY WITH A MEAL   morphine 30 MG 12 hr tablet Commonly known as: MS CONTIN TK 1 T PO  Q 12 H   nabumetone 750 MG tablet Commonly known as: RELAFEN Take 1 tablet (750 mg total) by mouth 2 (two) times daily as needed.   pantoprazole 40 MG tablet Commonly known as: PROTONIX Take 1 tablet (40 mg total) by mouth daily.   pravastatin 10 MG tablet Commonly known as: PRAVACHOL Take 2 tablets (20 mg total)  by mouth daily.   promethazine 12.5 MG tablet Commonly known as: PHENERGAN TAKE 1 TABLET BY MOUTH EVERY 8 HOURS AS NEEDED FOR NAUSEA OR VOMITING   Vitamin D 50 MCG (2000 UT) Caps Take 1 capsule (2,000 Units total) by mouth daily.         Past Medical History:  Diagnosis Date   Beta thalassemia (HCC)    Blood dyscrasia    thalasemia, sickle cell trait   Childhood asthma    Chronic abdominal pain    Unclear etiology, thought to be due to chronic pancreatitis previously in setting of her pancreatic divisum - however, EUS and EGD performed at [facility]  (10/05/2011) showing normal esophageall, gastric, duodenal mucosa. EUS showing no pancreatic masses, cysts, or changes of chronic pancreatitis. Biliary system nondilated and had no endosonographic abnormalities.   Colon cancer screening 05/23/2016   CVA (cerebral vascular accident) [facility]) 1993   Per report by patient she had a stroke and prolonged rehab course; still "weak on left side but not much" (03/29/2017)   Encounter for screening mammogram for breast cancer 12/02/2016   Family history of adverse reaction to anesthesia    "mom took a long while to wake up; she was allergic to it" (03/29/2017)   History of blood transfusion 1980s   "related to minor sickle cell crisis" (03/29/2017)   HLD (hyperlipidemia) 2009   Hypertension    Iron deficiency anemia 11/16/2011   Migraine headache    "q couple years now" (03/29/2017)   Palpitation 07/18/2016   Pancreatic divisum    S/P ERCP with stenting 07/2010 at [facility], then stent removal (08/05/2010)   Pneumonia    "had it in my teens 3 times" (03/29/2017)   Pulmonary embolus ([place]) 08/2004   Two areas of V/Q mismatch. Findings compatible  with high probability for pulmonary embolus.; pt was on coumadin for 1 year   Right calf pain 05/18/2018   Seizures ([place]) 1993   "after the stroke in 1993; none for years now"  (03/29/2017)   Sickle cell trait ([place])    Thalassemia    Type II diabetes  mellitus ([place]) 2010   well controlled   Uterine cancer ([place])    Review of Systems:  per HPI.   Physical Exam: Vitals:   02/02/22 0835  BP: (!) 146/71  Pulse: 86  Temp: 98.2 F (36.8 C)  TempSrc: Oral  SpO2: 100%  Weight: 100 lb 14.4 oz (45.8 kg)  Height: 5' (1.524 m)   Constitutional: appears in pain, tearful Cardiovascular: Regular rate, regular rhythm. No murmurs, rubs, or gallops. Normal radial and PT pulses bilaterally. No LE edema.  Pulmonary: Normal respiratory effort. No wheezes, rales, or rhonchi.   Abdominal: Soft. Non-distended. Normal bowel sounds. Tenderness to palpation of the LUQ. Involuntary guarding. No rebound tenderness.  Musculoskeletal: Normal range of motion. No tenderness to palpation of the paraspinal regions bilaterally.  Neurological: Alert and oriented to person, place, and time. Non-focal. Skin: warm and dry. Decreased skin turgor.  Assessment & Plan:   See Encounters Tab for problem based charting.  Patient seen with Dr. Jimmye Norman

## 2022-02-02 NOTE — Assessment & Plan Note (Signed)
Current medications include metFORMIN 1000 MG BID. Patient states that she is compliant with this medication but has been eating foods high in sugar due to her diet being limited by abdominal pain likely related to her chronic pancreatitis. Patient denies polyuria, polydipsia, or fatigue. Patients A1c was 7.6 in 08/2021. A1c today 8.3. Patient does follow up with ophthalmology for yearly eye exams. Normal diabetic foot exam today.   Plan: - Discuss increasing metformin dose at next visit (unable to get to day due to admission for acute pancreatitis) - Urine albumin to creatinine ratio at next visit (unable to get to day due to admission for acute pancreatitis)

## 2022-02-02 NOTE — Patient Instructions (Signed)
.  map

## 2022-02-02 NOTE — Assessment & Plan Note (Signed)
- 

## 2022-02-02 NOTE — Progress Notes (Signed)
Patient was updated on the results of her capillary glucose and A1c during her visit.

## 2022-02-03 ENCOUNTER — Other Ambulatory Visit (HOSPITAL_COMMUNITY): Payer: Self-pay

## 2022-02-03 ENCOUNTER — Observation Stay (HOSPITAL_COMMUNITY): Payer: Medicare HMO

## 2022-02-03 DIAGNOSIS — E119 Type 2 diabetes mellitus without complications: Secondary | ICD-10-CM | POA: Diagnosis not present

## 2022-02-03 DIAGNOSIS — Z7984 Long term (current) use of oral hypoglycemic drugs: Secondary | ICD-10-CM | POA: Diagnosis not present

## 2022-02-03 DIAGNOSIS — I10 Essential (primary) hypertension: Secondary | ICD-10-CM | POA: Diagnosis not present

## 2022-02-03 DIAGNOSIS — R109 Unspecified abdominal pain: Secondary | ICD-10-CM | POA: Diagnosis not present

## 2022-02-03 DIAGNOSIS — J45909 Unspecified asthma, uncomplicated: Secondary | ICD-10-CM | POA: Diagnosis not present

## 2022-02-03 DIAGNOSIS — I700 Atherosclerosis of aorta: Secondary | ICD-10-CM | POA: Diagnosis not present

## 2022-02-03 DIAGNOSIS — R1012 Left upper quadrant pain: Secondary | ICD-10-CM | POA: Diagnosis not present

## 2022-02-03 DIAGNOSIS — R1013 Epigastric pain: Secondary | ICD-10-CM | POA: Diagnosis not present

## 2022-02-03 DIAGNOSIS — Z8673 Personal history of transient ischemic attack (TIA), and cerebral infarction without residual deficits: Secondary | ICD-10-CM | POA: Diagnosis not present

## 2022-02-03 DIAGNOSIS — Q453 Other congenital malformations of pancreas and pancreatic duct: Secondary | ICD-10-CM | POA: Diagnosis not present

## 2022-02-03 DIAGNOSIS — Z79899 Other long term (current) drug therapy: Secondary | ICD-10-CM | POA: Diagnosis not present

## 2022-02-03 DIAGNOSIS — Z86711 Personal history of pulmonary embolism: Secondary | ICD-10-CM | POA: Diagnosis not present

## 2022-02-03 LAB — COMPREHENSIVE METABOLIC PANEL
ALT: 18 U/L (ref 0–44)
AST: 24 U/L (ref 15–41)
Albumin: 3.4 g/dL — ABNORMAL LOW (ref 3.5–5.0)
Alkaline Phosphatase: 49 U/L (ref 38–126)
Anion gap: 9 (ref 5–15)
BUN: 8 mg/dL (ref 8–23)
CO2: 27 mmol/L (ref 22–32)
Calcium: 9.2 mg/dL (ref 8.9–10.3)
Chloride: 103 mmol/L (ref 98–111)
Creatinine, Ser: 0.63 mg/dL (ref 0.44–1.00)
GFR, Estimated: >60 mL/min (ref >60)
Glucose, Bld: 101 mg/dL — ABNORMAL HIGH (ref 70–99)
Potassium: 3.6 mmol/L (ref 3.5–5.1)
Sodium: 139 mmol/L (ref 135–145)
Total Bilirubin: 0.7 mg/dL (ref 0.3–1.2)
Total Protein: 6.9 g/dL (ref 6.5–8.1)

## 2022-02-03 LAB — CBC
HCT: 33 % — ABNORMAL LOW (ref 36.0–46.0)
Hemoglobin: 11.3 g/dL — ABNORMAL LOW (ref 12.0–15.0)
MCH: 26.5 pg (ref 26.0–34.0)
MCHC: 34.2 g/dL (ref 30.0–36.0)
MCV: 77.5 fL — ABNORMAL LOW (ref 80.0–100.0)
Platelets: 353 10*3/uL (ref 150–400)
RBC: 4.26 MIL/uL (ref 3.87–5.11)
RDW: 13.8 % (ref 11.5–15.5)
WBC: 7.7 10*3/uL (ref 4.0–10.5)
nRBC: 0 % (ref 0.0–0.2)

## 2022-02-03 LAB — GLUCOSE, CAPILLARY
Glucose-Capillary: 94 mg/dL (ref 70–99)
Glucose-Capillary: 91 mg/dL (ref 70–99)

## 2022-02-03 MED ORDER — ORAL CARE MOUTH RINSE: 15.0000 mL | OROMUCOSAL | Status: DC | PRN

## 2022-02-03 MED ORDER — ACETAMINOPHEN 650 MG RE SUPP: 650.0000 mg | Freq: Three times a day (TID) | RECTAL | Status: DC

## 2022-02-03 MED ORDER — ACETAMINOPHEN 500 MG PO TABS
1000.0000 mg | ORAL_TABLET | Freq: Three times a day (TID) | ORAL | 0 refills | Status: AC | PRN
  Filled 2022-02-03: qty 30, 5d supply, fill #0

## 2022-02-03 MED ORDER — ACETAMINOPHEN 325 MG PO TABS: 650.0000 mg | ORAL_TABLET | Freq: Four times a day (QID) | ORAL | Status: DC

## 2022-02-03 MED ORDER — ENOXAPARIN SODIUM 30 MG/0.3ML IJ SOSY: 30.0000 mg | PREFILLED_SYRINGE | INTRAMUSCULAR | Status: DC

## 2022-02-03 MED ORDER — ACETAMINOPHEN 650 MG RE SUPP: 650.0000 mg | Freq: Four times a day (QID) | RECTAL | Status: DC

## 2022-02-03 MED ORDER — ACETAMINOPHEN 500 MG PO TABS
1000.0000 mg | ORAL_TABLET | Freq: Three times a day (TID) | ORAL | Status: DC
  Administered 2022-02-03: 1000 mg via ORAL
  Filled 2022-02-03: qty 2

## 2022-02-03 MED ORDER — IOHEXOL 350 MG/ML SOLN
75.0000 mL | Freq: Once | INTRAVENOUS | Status: AC | PRN
  Administered 2022-02-03: 75 mL via INTRAVENOUS

## 2022-02-03 NOTE — Care Management Obs Status (Signed)
[place] NOTIFICATION   Patient Details  Name: [name] MRN: [id number] Date of Birth: August 07, 1955   Medicare Observation Status Notification Given:  Yes    Tom-Johnson, Renea Ee, RN 02/03/2022, 12:07 PM

## 2022-02-03 NOTE — TOC Transition Note (Signed)
Transition of Care [facility]) - CM/SW Discharge Note   Patient Details  Name: [name] MRN: [id number] Date of Birth: 1956-01-13  Transition of [facility]) CM/SW Contact:  Tom-Johnson, Renea Ee, RN Phone Number: 02/03/2022, 12:09 PM   Clinical Narrative:     Patient is scheduled for discharge today. No TOC needs or recommendations noted. Family to transport at discharge. No further TOC needs noted.   Final next level of care: Home/Self Care Barriers to Discharge: Barriers Resolved   Patient Goals and CMS Choice Patient states their goals for this hospitalization and ongoing recovery are:: To return home CMS Medicare.gov Compare Post Acute Care list provided to:: Patient Choice offered to / list presented to : NA  Discharge Placement                Patient to be transferred to facility by: Family      Discharge Plan and Services                DME Arranged: N/A DME Agency: NA       HH Arranged: NA HH Agency: NA        Social Determinants of Health (SDOH) Interventions     Readmission Risk Interventions     No data to display

## 2022-02-03 NOTE — Discharge Instructions (Signed)
[name]  You were admitted for acute abdominal pain with bloating and treated with pain medicines and IV fluids.  It is not clear what the cause of your abdominal pain is.  Your laboratory results and CT scan did not show any evidence of acute pancreatitis.  We do not think that your abdominal pain is caused by something life-threatening.  We are discharging you home with the hope that you will be evaluated by your primary care doctor and your gastroenterologist for a more definitive diagnosis for the cause of your abdominal pain. To help assist you on your road to recovery, I have written the following recommendations:   For abdominal pain, continue your MS Contin as prescribed.  You may also take Tylenol, 1000 mg (2 tablets) every 8 hours as needed.  Please continue taking your other medications as prescribed.  Please follow-up with [name] in the internal medicine center on October 19 at 1:45 PM.  Please arrive 15 minutes early for your appointment.  Finally, please make an appointment with your gastroenterologist for further evaluation of your abdominal pain in the setting of chronic pancreatitis.  It was a privilege to be a part of your hospital care team, and I hope you feel better as a result of your stay.  All the best, Nani Gasser, MD

## 2022-02-03 NOTE — Discharge Summary (Signed)
Name: [name] MRN: [id number] DOB: 1956-02-07 66 y.o. PCP: Dorethea Clan, DO  Date of Admission: 02/02/2022  3:53 PM Date of Discharge: 02/03/2022 11:22 AM Attending Physician: Lucious Groves, DO  Discharge Diagnosis: Principal Problem:   Abdominal pain Active Problems:   Diabetes type 2, controlled ([place])   Essential hypertension   Congenital anomaly of pancreas   Chronic pancreatitis [facility])   Discharge Medications: Allergies as of 02/03/2022       Reactions   Butalbital-aspirin-caffeine Anaphylaxis   Fioricet [butalbital-apap-caffeine] Other (See Comments)   Put into a coma state; "it was actually fiorinol; same family" (02/27/2012)   Shellfish-derived Products Anaphylaxis   Shrimp Flavor Anaphylaxis   unknow   Sulfonamide Derivatives Rash   Aspirin    Stomach burning   Sulfamethoxazole Rash        Medication List     TAKE these medications    acetaminophen 500 MG tablet Commonly known as: TYLENOL Take 2 tablets (1,000 mg total) by mouth every 8 (eight) hours as needed for mild pain or moderate pain. What changed:  how much to take when to take this   calcium carbonate 500 MG chewable tablet Commonly known as: TUMS - dosed in mg elemental calcium Chew 1 tablet by mouth daily as needed for indigestion or heartburn.   Creon 36000 UNITS Cpep capsule Generic drug: lipase/protease/amylase TAKE 2 CAPSULES BY MOUTH WITH EACH MEAL ABDOMINAL 1 CAPSULE WITH EACH SNACK   fluticasone 50 MCG/ACT nasal spray Commonly known as: FLONASE Place 2 sprays into both nostrils daily.   lisinopril-hydrochlorothiazide 20-12.5 MG tablet Commonly known as: ZESTORETIC Take 1 tablet by mouth daily.   metFORMIN 1000 MG tablet Commonly known as: GLUCOPHAGE TAKE 1 TABLET(1000 MG) BY MOUTH TWICE DAILY WITH A MEAL What changed: See the new instructions.   morphine 30 MG 12 hr tablet Commonly known as: MS CONTIN Take 30 mg by mouth 2 (two) times daily.    MULTIVITAMIN PO Take 1 tablet by mouth daily.   promethazine 12.5 MG tablet Commonly known as: PHENERGAN TAKE 1 TABLET BY MOUTH EVERY 8 HOURS AS NEEDED FOR NAUSEA OR VOMITING What changed:  when to take this reasons to take this        Disposition and follow-up:   [name] is a 66 y.o. year old female admitted to [facility] for abdominal pain with bloating, thought to be due to acute on chronic pancreatitis but without much objective data to support this diagnosis. They were discharged from [facility] on hospital day 1 in Good condition.  At the hospital follow up visit please address:  Abdominal pain with bloating ?Acute on chronic pancreatitis versus gastritis/PUD versus functional abdominal pain Symptoms consistent with prior episodes of acute pancreatitis. However, no identifiable trigger. Lipase within normal limits and CT without evidence of inflammation. Workup should include referral to gastroenterology for likely EGD and colonoscopy, per patient's last encounter with them. - Please ensure follow-up with [facility] gastro[facility]ology.  Pain management Discharged on following regimen: - MS Contin 30 mg every 12 hours (long-term, per pain management clinic) - Acetaminophen 1000 mg every 8 hours as needed for pain (prescribed on discharge)  Follow-up Appointments:  Follow-up Information     Mapp, Claudia Desanctis, MD. Go on 02/17/2022.   Why: Please arrive at 1:30 PM for your appointment. Contact information: [place] [address] [phone]         Elroy Channel, MD.  Schedule an appointment as soon as possible for a visit in 1 week(s).   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  Acute abdominal pain with bloating Patient presented to the internal medicine center with 2 days of severe left upper quadrant and epigastric abdominal pain associated with belching and flatulence.  Also decreased appetite.  Pain radiates to back/left upper flank.  No fevers, vomiting, diarrhea, hematochezia, dysuria.  Work-up notable for lipase of 32.  CT abdomen/pelvis was unremarkable, with no evidence of peripancreatic inflammation, bowel obstruction, colitis.  Patient was supported with IV fluids and pain medicine.  Her condition remained stable overnight.  No objective findings to support a diagnosis of acute on chronic pancreatitis.  Recent gastroenterology work-ups for abdominal pain have not been suggestive of acute on chronic pancreatitis.  Differential diagnosis includes chronic gastritis or peptic ulcer disease versus functional abdominal pain versus acute on chronic pancreatitis.  She was discharged home on hospital day 1 with instructions to follow-up in the internal medicine center, and with her gastroenterologist for EGD and colonoscopy.  Discharge Exam:  Subjective: Patient still reports left upper quadrant/epigastric pain that radiates to left upper back/flank.  Reports continued suppressed appetite.  Reports some nausea without vomiting.  Denies subjective fevers, diarrhea, constipation, hematochezia.   Blood pressure 111/65, pulse 72, temperature 98.5 F (36.9 C), temperature source Oral, resp. rate 20, height 5' (1.524 m), weight 45.5 kg, SpO2 100 %. General: Uncomfortable appearing female.  Nontoxic-appearing.  Not in acute distress. HEENT: Anicteric sclera. Cardiovascular: Regular rate and rhythm.  Left radial pulse normal. Respiratory: Normal work of breathing. Abdominal: Tenderness to left upper quadrant on deep  palpation.  Abdomen soft and nondistended. Skin: Warm and dry.  Pertinent studies and procedures:  Lipase     Component Value Date/Time   LIPASE 38 02/02/2022 1106      Latest Ref Rng & Units 02/03/2022    3:48 AM 02/02/2022   11:06 AM 09/01/2021   11:04 AM  CBC  WBC 4.0 - 10.5 K/uL 7.7  6.5  7.2   Hemoglobin 12.0 - 15.0 g/dL 11.3  12.3  12.4   Hematocrit 36.0 - 46.0 % 33.0  37.1  37.2   Platelets 150 - 400 K/uL 353  389  466         Latest Ref Rng & Units 02/03/2022    3:48 AM 02/02/2022   11:06 AM 08/24/2021    3:23 PM  CMP  Glucose 70 - 99 mg/dL 101  139  132   BUN 8 - 23 mg/dL '8  10  7   '$ Creatinine 0.44 - 1.00 mg/dL 0.63  0.80  0.65   Sodium 135 - 145 mmol/L 139  141  146   Potassium 3.5 - 5.1 mmol/L 3.6  4.0  4.4   Chloride 98 - 111 mmol/L 103  101  101   CO2 22 - 32 mmol/L '27  28  23   '$ Calcium 8.9 - 10.3 mg/dL 9.2  10.1  10.6   Total Protein 6.5 - 8.1 g/dL 6.9  7.5    Total Bilirubin 0.3 -  1.2 mg/dL 0.7  0.5    Alkaline Phos 38 - 126 U/L 49  57    AST 15 - 41 U/L 24  23    ALT 0 - 44 U/L 18  18     CT abdomen pelvis with contrast: No acute abnormality noted. Specifically no findings to suggest acute pancreatitis.  Discharge Instructions:   Discharge Instructions      [name]  You were admitted for acute abdominal pain with bloating and treated with pain medicines and IV fluids.  It is not clear what the cause of your abdominal pain is.  Your laboratory results and CT scan did not show any evidence of acute pancreatitis.  We do not think that your abdominal pain is caused by something life-threatening.  We are discharging you home with the hope that you will be evaluated by your primary care doctor and your gastroenterologist for a more definitive diagnosis for the cause of your abdominal pain. To help assist you on your road to recovery, I have written the following recommendations:   For abdominal pain, continue your MS Contin as prescribed.   You may also take Tylenol, 1000 mg (2 tablets) every 8 hours as needed.  Please continue taking your other medications as prescribed.  Please follow-up with [name] in the internal medicine center on October 19 at 1:45 PM.  Please arrive 15 minutes early for your appointment.  Finally, please make an appointment with your gastroenterologist for further evaluation of your abdominal pain in the setting of chronic pancreatitis.  It was a privilege to be a part of your hospital care team, and I hope you feel better as a result of your stay.  All the best, Nani Gasser, MD     Signed: Nani Gasser MD 02/03/2022, 11:22 AM   Pager: [phone]

## 2022-02-08 NOTE — Addendum Note (Signed)
Addended by: Starlyn Skeans on: 02/08/2022 08:54 AM   Modules accepted: Orders

## 2022-02-13 NOTE — Progress Notes (Signed)
Internal Medicine Clinic Attending  I saw and evaluated the patient.  I personally confirmed the key portions of the history and exam documented by Dr. Mapp and I reviewed pertinent patient test results.  The assessment, diagnosis, and plan were formulated together and I agree with the documentation in the resident's note.  

## 2022-02-14 ENCOUNTER — Other Ambulatory Visit: Payer: Self-pay | Admitting: Student

## 2022-02-14 DIAGNOSIS — Q453 Other congenital malformations of pancreas and pancreatic duct: Secondary | ICD-10-CM

## 2022-02-14 NOTE — Telephone Encounter (Signed)
Next appt scheduled 10/19 with Dr Alton Revere.

## 2022-02-16 ENCOUNTER — Encounter: Payer: Medicare HMO | Admitting: Student

## 2022-02-25 ENCOUNTER — Ambulatory Visit (INDEPENDENT_AMBULATORY_CARE_PROVIDER_SITE_OTHER): Payer: Medicare HMO | Admitting: Student

## 2022-02-25 ENCOUNTER — Other Ambulatory Visit: Payer: Self-pay

## 2022-02-25 ENCOUNTER — Encounter: Payer: Self-pay | Admitting: Student

## 2022-02-25 VITALS — BP 147/78 | HR 78 | Temp 98.2°F | Resp 24 | Ht 60.0 in | Wt 104.0 lb

## 2022-02-25 DIAGNOSIS — Z87891 Personal history of nicotine dependence: Secondary | ICD-10-CM

## 2022-02-25 DIAGNOSIS — K861 Other chronic pancreatitis: Secondary | ICD-10-CM | POA: Diagnosis not present

## 2022-02-25 DIAGNOSIS — Q453 Other congenital malformations of pancreas and pancreatic duct: Secondary | ICD-10-CM

## 2022-02-25 DIAGNOSIS — Z0000 Encounter for general adult medical examination without abnormal findings: Secondary | ICD-10-CM

## 2022-02-25 DIAGNOSIS — I10 Essential (primary) hypertension: Secondary | ICD-10-CM

## 2022-02-25 DIAGNOSIS — R109 Unspecified abdominal pain: Secondary | ICD-10-CM | POA: Diagnosis not present

## 2022-02-25 DIAGNOSIS — E785 Hyperlipidemia, unspecified: Secondary | ICD-10-CM

## 2022-02-25 DIAGNOSIS — Z7984 Long term (current) use of oral hypoglycemic drugs: Secondary | ICD-10-CM

## 2022-02-25 DIAGNOSIS — E119 Type 2 diabetes mellitus without complications: Secondary | ICD-10-CM | POA: Diagnosis not present

## 2022-02-25 DIAGNOSIS — G8929 Other chronic pain: Secondary | ICD-10-CM

## 2022-02-25 DIAGNOSIS — K219 Gastro-esophageal reflux disease without esophagitis: Secondary | ICD-10-CM | POA: Diagnosis not present

## 2022-02-25 MED ORDER — LISINOPRIL-HYDROCHLOROTHIAZIDE 20-12.5 MG PO TABS: 1.0000 | ORAL_TABLET | Freq: Every day | ORAL | 1 refills | Status: DC

## 2022-02-25 MED ORDER — PANCRELIPASE (LIP-PROT-AMYL) 36000-114000 UNITS PO CPEP: ORAL_CAPSULE | ORAL | 2 refills | Status: DC

## 2022-02-25 MED ORDER — METFORMIN HCL 1000 MG PO TABS: 1000.0000 mg | ORAL_TABLET | Freq: Two times a day (BID) | ORAL | 1 refills | Status: DC

## 2022-02-25 NOTE — Assessment & Plan Note (Signed)
Patient with history of HTN, on zestoretic 20-12.'5mg'$  daily. She has ran out of her zestoretic, which is reflected on her BP measurements today (140/75, repeat 147/78). I have refilled her zestoretic.  Plan: -refilled zestoretic

## 2022-02-25 NOTE — Assessment & Plan Note (Addendum)
Patient was previously on pravastatin '20mg'$  BID for hyperlipidemia, but stopped taking it as it became cost-prohibitive. Last lipid panel was obtained in 2021 so will repeat today (non-fasting).  Plan: -f/u lipid panel -reinitiate pravastatin if necessary  ADDENDUM: Unable to obtain lipid panel due to technical difficulties. Will obtain at next visit in 1 month.

## 2022-02-25 NOTE — Assessment & Plan Note (Signed)
She has an eye doctor that she follows at [facility]. She will call to schedule appointment for annual diabetic eye exam.

## 2022-02-25 NOTE — Patient Instructions (Signed)
Ms. Italiano,  It was a pleasure seeing you in the clinic today.   I have refilled your metformin, zestoretic (blood pressure), and Creon. I have placed a new referral to a stomach doctor closer to [place]. They will contact you to schedule an appointment. Please call your eye doctor to have your yearly diabetic eye exam done. We are checking your cholesterol level today. I will call you with the results. Please come back to see [name] in 1 month.  Please call our clinic at [phone] if you have any questions or concerns. The best time to call is Monday-Friday from 9am-4pm, but there is someone available 24/7 at the same number. If you need medication refills, please notify your pharmacy one week in advance and they will send [name] a request.   Thank you for letting us take part in your care. We look forward to seeing you next time!

## 2022-02-25 NOTE — Assessment & Plan Note (Addendum)
Patient presenting for hospital follow up after recent hospitalization for acute on chronic abdominal pain. Workup was negative during hospitalization. She has a history of chronic abdominal pain thought to be 2/2 chronic pancreatitis (although no documentation of objective findings to support this). She has had imaging with CT abd/pel dating back to 2005. She had prominence of pancreatic duct noted in 2005 and pancreatic ductal dilatation noted in 2011, but no pancreatic/peripancreatic fluid or inflammatory changes noted on any imaging thus far. Nonetheless, she is more likely to develop chronic pancreatitis given congenital anomaly of pancreatic divisum. She was previously followed [facility] and was prescribed creon, but she has been unable to see them or obtain creon since her husband was diagnosed with cancer and suffered from a stroke earlier this year. Hospital follow up recommendations included GI follow up for EGD and colonoscopy, and patient is due for a screening colonoscopy. She would like to establish with a GI clinic that is closer to [facility] and thus will replace a referral to GI. I have refilled her creon as well. She notes having nonbloody diarrhea when not taking creon and this medication was previously cost-prohibitive. She would like a refill today for symptom management, which is reasonable.  Plan: -referral to GI for a clinic closer to [facility] (needs EGD and colonoscopy) -refilled creon -f/u in 1 month

## 2022-02-25 NOTE — Progress Notes (Signed)
CC: hospital follow up  HPI:  [name] is a 66 y.o. female with history listed below presenting to the [facility] for hospital follow up. Please see individualized problem based charting for full HPI.  Past Medical History:  Diagnosis Date   Abdominal pain 02/02/2022   Abdominal pain, chronic, left upper quadrant 09/21/2016   Overview:  Added automatically from request for surgery [id number]   Beta thalassemia (HCC)    Blood dyscrasia    thalasemia, sickle cell trait   Childhood asthma    Chronic abdominal pain    Unclear etiology, thought to be due to chronic pancreatitis previously in setting of her pancreatic divisum - however, EUS and EGD performed at [facility]  (10/05/2011) showing normal esophageall, gastric, duodenal mucosa. EUS showing no pancreatic masses, cysts, or changes of chronic pancreatitis. Biliary system nondilated and had no endosonographic abnormalities.   Colon cancer screening 05/23/2016   CVA (cerebral vascular accident) [facility]) 1993   Per report by patient she had a stroke and prolonged rehab course; still "weak on left side but not much" (03/29/2017)   Encounter for screening mammogram for breast cancer 12/02/2016   Family history of adverse reaction to anesthesia    "mom took a long while to wake up; she was allergic to it" (03/29/2017)   Grief reaction 02/24/2017   History of blood transfusion 1980s   "related to minor sickle cell crisis" (03/29/2017)   HLD (hyperlipidemia) 2009   Hypertension    Iron deficiency anemia 11/16/2011   Migraine headache    "q couple years now" (03/29/2017)   Palpitation 07/18/2016   Pancreatic divisum    S/P ERCP with stenting 07/2010 at [facility], then stent removal (08/05/2010)   Pneumonia    "had it in my teens 3 times" (03/29/2017)   Pulmonary embolus ([place]) 08/2004   Two areas of V/Q mismatch. Findings compatible  with high probability for pulmonary embolus.; pt was on coumadin for 1 year   Right calf pain 05/18/2018    Seizures ([place]) 1993   "after the stroke in 1993; none for years now"  (03/29/2017)   Sickle cell trait ([place])    Thalassemia    Type II diabetes mellitus ([place]) 2010   well controlled   Uterine cancer ([place])     Review of Systems:  Negative aside from that listed in individualized problem based charting.  Physical Exam:  Vitals:   02/25/22 0911 02/25/22 0918  BP: (!) 140/75 (!) 147/78  Pulse: 80 78  Resp: (!) 24   Temp: 98.2 F (36.8 C)   TempSrc: Oral   SpO2: 100%   Weight: 104 lb (47.2 kg)   Height: 5' (1.524 m)    Physical Exam Constitutional:      General: She is not in acute distress.    Appearance: Normal appearance. She is not ill-appearing.     Comments: Thin  HENT:     Mouth/Throat:     Mouth: Mucous membranes are moist.     Pharynx: Oropharynx is clear.  Eyes:     Extraocular Movements: Extraocular movements intact.     Conjunctiva/sclera: Conjunctivae normal.     Pupils: Pupils are equal, round, and reactive to light.  Cardiovascular:     Rate and Rhythm: Normal rate and regular rhythm.     Pulses: Normal pulses.     Heart sounds: Normal heart sounds. No murmur heard.    No friction rub. No gallop.  Pulmonary:     Effort: Pulmonary effort is  normal.     Breath sounds: Normal breath sounds. No wheezing, rhonchi or rales.  Abdominal:     General: Bowel sounds are normal. There is no distension.     Palpations: Abdomen is soft.     Tenderness: There is no guarding or rebound.     Comments: Mild TTP of epigastric region  Musculoskeletal:        General: No swelling. Normal range of motion.  Skin:    General: Skin is warm and dry.  Neurological:     General: No focal deficit present.     Mental Status: She is alert and oriented to person, place, and time.  Psychiatric:        Mood and Affect: Mood normal.        Behavior: Behavior normal.      Assessment & Plan:   See Encounters Tab for problem based charting.  Patient discussed with Dr.  Philipp Ovens

## 2022-02-28 NOTE — Progress Notes (Signed)
Internal Medicine Clinic Attending  Case discussed with Dr. Jinwala  At the time of the visit.  We reviewed the resident's history and exam and pertinent patient test results.  I agree with the assessment, diagnosis, and plan of care documented in the resident's note.  

## 2022-02-28 NOTE — Addendum Note (Signed)
Addended by: Jodean Lima on: 02/28/2022 11:24 AM   Modules accepted: Level of Service

## 2022-03-01 ENCOUNTER — Other Ambulatory Visit: Payer: Medicare HMO

## 2022-03-02 ENCOUNTER — Other Ambulatory Visit: Payer: Self-pay | Admitting: Internal Medicine

## 2022-03-02 DIAGNOSIS — E119 Type 2 diabetes mellitus without complications: Secondary | ICD-10-CM

## 2022-03-16 ENCOUNTER — Other Ambulatory Visit: Payer: Self-pay

## 2022-03-16 DIAGNOSIS — Q453 Other congenital malformations of pancreas and pancreatic duct: Secondary | ICD-10-CM

## 2022-03-16 MED ORDER — PROMETHAZINE HCL 12.5 MG PO TABS: 12.5000 mg | ORAL_TABLET | Freq: Three times a day (TID) | ORAL | 0 refills | Status: DC | PRN

## 2022-03-22 DIAGNOSIS — K861 Other chronic pancreatitis: Secondary | ICD-10-CM | POA: Diagnosis not present

## 2022-03-22 DIAGNOSIS — R1012 Left upper quadrant pain: Secondary | ICD-10-CM | POA: Diagnosis not present

## 2022-03-28 ENCOUNTER — Ambulatory Visit (INDEPENDENT_AMBULATORY_CARE_PROVIDER_SITE_OTHER): Payer: Medicare HMO | Admitting: Internal Medicine

## 2022-03-28 ENCOUNTER — Encounter: Payer: Self-pay | Admitting: Internal Medicine

## 2022-03-28 ENCOUNTER — Other Ambulatory Visit: Payer: Self-pay

## 2022-03-28 VITALS — BP 120/87 | HR 77 | Temp 98.6°F | Resp 28 | Ht 60.0 in | Wt 105.6 lb

## 2022-03-28 DIAGNOSIS — Z7984 Long term (current) use of oral hypoglycemic drugs: Secondary | ICD-10-CM

## 2022-03-28 DIAGNOSIS — T733XXD Exhaustion due to excessive exertion, subsequent encounter: Secondary | ICD-10-CM

## 2022-03-28 DIAGNOSIS — E119 Type 2 diabetes mellitus without complications: Secondary | ICD-10-CM

## 2022-03-28 DIAGNOSIS — E785 Hyperlipidemia, unspecified: Secondary | ICD-10-CM | POA: Diagnosis not present

## 2022-03-28 DIAGNOSIS — Z0000 Encounter for general adult medical examination without abnormal findings: Secondary | ICD-10-CM

## 2022-03-28 DIAGNOSIS — R5383 Other fatigue: Secondary | ICD-10-CM | POA: Insufficient documentation

## 2022-03-28 DIAGNOSIS — T733XXS Exhaustion due to excessive exertion, sequela: Secondary | ICD-10-CM

## 2022-03-28 MED ORDER — EMPAGLIFLOZIN 10 MG PO TABS: 10.0000 mg | ORAL_TABLET | Freq: Every day | ORAL | 3 refills | Status: DC

## 2022-03-28 NOTE — Progress Notes (Signed)
CC: diabetes follow up  HPI:  [name] is a 66 y.o. female living with a history stated below and presents today for a follow up of her diabetes. Please see problem based assessment and plan for additional details.  Past Medical History:  Diagnosis Date   Abdominal pain 02/02/2022   Abdominal pain, chronic, left upper quadrant 09/21/2016   Overview:  Added automatically from request for surgery [id number]   Ankle injury, right, initial encounter 04/08/2020   IOV 04/08/20   Arthralgia 01/19/2018   Beta thalassemia (HCC)    Blood dyscrasia    thalasemia, sickle cell trait   Childhood asthma    Chronic abdominal pain    Unclear etiology, thought to be due to chronic pancreatitis previously in setting of her pancreatic divisum - however, EUS and EGD performed at [place]  (10/05/2011) showing normal esophageall, gastric, duodenal mucosa. EUS showing no pancreatic masses, cysts, or changes of chronic pancreatitis. Biliary system nondilated and had no endosonographic abnormalities.   Colon cancer screening 05/23/2016   CVA (cerebral vascular accident) [facility]) 1993   Per report by patient she had a stroke and prolonged rehab course; still "weak on left side but not much" (03/29/2017)   Encounter for screening mammogram for breast cancer 12/02/2016   Family history of adverse reaction to anesthesia    "mom took a long while to wake up; she was allergic to it" (03/29/2017)   Grief reaction 02/24/2017   History of blood transfusion 1980s   "related to minor sickle cell crisis" (03/29/2017)   HLD (hyperlipidemia) 2009   Hypertension    Iron deficiency anemia 11/16/2011   Migraine headache    "q couple years now" (03/29/2017)   Otitis media 08/24/2021   Palpitation 07/18/2016   Pancreatic divisum    S/P ERCP with stenting 07/2010 at [facility], then stent removal (08/05/2010)   Pneumonia    "had it in my teens 3 times" (03/29/2017)   Pulmonary embolus ([place]) 08/2004   Two  areas of V/Q mismatch. Findings compatible  with high probability for pulmonary embolus.; pt was on coumadin for 1 year   Right calf pain 05/18/2018   Seizures ([place]) 1993   "after the stroke in 1993; none for years now"  (03/29/2017)   Sickle cell trait ([place])    Thalassemia    Type II diabetes mellitus ([place]) 2010   well controlled   Uterine cancer ([place])     Current Outpatient Medications on File Prior to Visit  Medication Sig Dispense Refill   acetaminophen (TYLENOL) 500 MG tablet Take 2 tablets (1,000 mg total) by mouth every 8 (eight) hours as needed for mild pain or moderate pain. 30 tablet 0   calcium carbonate (TUMS - DOSED IN MG ELEMENTAL CALCIUM) 500 MG chewable tablet Chew 1 tablet by mouth daily as needed for indigestion or heartburn.     fluticasone (FLONASE) 50 MCG/ACT nasal spray Place 2 sprays into both nostrils daily. (Patient not taking: Reported on 02/02/2022) 9.9 mL 0   lipase/protease/amylase (CREON) 36000 UNITS CPEP capsule TAKE 2 CAPSULES BY MOUTH WITH EACH MEAL. 1 CAPSULE WITH EACH SNACK 90 capsule 2   lisinopril-hydrochlorothiazide (ZESTORETIC) 20-12.5 MG tablet Take 1 tablet by mouth daily. 90 tablet 1   metFORMIN (GLUCOPHAGE) 1000 MG tablet Take 1 tablet (1,000 mg total) by mouth 2 (two) times daily with a meal. 180 tablet 1   morphine (MS CONTIN) 30 MG 12 hr tablet Take 30 mg by mouth 2 (two) times daily.  Multiple Vitamin (MULTIVITAMIN PO) Take 1 tablet by mouth daily.     promethazine (PHENERGAN) 12.5 MG tablet Take 1 tablet (12.5 mg total) by mouth every 8 (eight) hours as needed. 60 tablet 0   No current facility-administered medications on file prior to visit.    Family History  Problem Relation Age of Onset   Prostate cancer Father    Cancer Father        stomach ca died x 1 year ago   Sickle cell anemia Brother    Lung cancer Maternal Aunt    Lung cancer Maternal Uncle    Breast cancer Maternal Grandmother    Pancreatic cancer Maternal Grandmother     Heart disease Maternal Grandfather    Heart disease Paternal Grandfather    Diabetes Other    Cancer Other        multiple cancers pancreas,colon, breast    Social History   Socioeconomic History   Marital status: Married    Spouse name: Not on file   Number of children: Not on file   Years of education: 12   Highest education level: Not on file  Occupational History    Employer: UNEMPLOYED  Tobacco Use   Smoking status: Former    Packs/day: 0.50    Years: 30.00    Total pack years: 15.00    Types: Cigarettes    Quit date: 01/01/1999    Years since quitting: 23.2   Smokeless tobacco: Never  Vaping Use   Vaping Use: Never used  Substance and Sexual Activity   Alcohol use: No    Alcohol/week: 0.0 standard drinks of alcohol   Drug use: No   Sexual activity: Not Currently    Partners: Male  Other Topics Concern   Not on file  Social History Narrative   Worked at [employer] as a Librarian, academic, on Retail banker about 9 hours/ day   Social Determinants of Health   Financial Resource Strain: Low Risk  (03/28/2022)   Overall Financial Resource Strain (CARDIA)    Difficulty of Paying Living Expenses: Not hard at all  Food Insecurity: No Food Insecurity (03/28/2022)   Hunger Vital Sign    Worried About Running Out of Food in the Last Year: Never true    [place] in the Last Year: Never true  Transportation Needs: No Transportation Needs (03/28/2022)   PRAPARE - Hydrologist (Medical): No    Lack of Transportation (Non-Medical): No  Physical Activity: Inactive (12/31/2021)   Exercise Vital Sign    Days of Exercise per Week: 0 days    Minutes of Exercise per Session: 0 min  Stress: No Stress Concern Present (12/31/2021)   [place]    Feeling of Stress : Not at all  Social Connections: [place] (12/31/2021)   Social Connection and Isolation Panel [NHANES]    Frequency  of Communication with Friends and Family: More than three times a week    Frequency of Social Gatherings with Friends and Family: More than three times a week    Attends Religious Services: More than 4 times per year    Active Member of [employer] or Organizations: Yes    Attends Archivist Meetings: More than 4 times per year    Marital Status: Married  Human resources officer Violence: Not At Risk (02/02/2022)   Humiliation, Afraid, Rape, and Kick questionnaire    Fear of Current or Ex-Partner: No  Emotionally Abused: No    Physically Abused: No    Sexually Abused: No    Review of Systems: ROS negative except for what is noted on the assessment and plan.  Vitals:   03/28/22 0845  BP: 120/87  Pulse: 77  Resp: (!) 28  Temp: 98.6 F (37 C)  TempSrc: Oral  SpO2: 100%  Weight: 105 lb 9.6 oz (47.9 kg)  Height: 5' (1.524 m)    Physical Exam: Constitutional: well-appearing elderly female sitting in chair, in no acute distress Cardiovascular: regular rate and rhythm, no m/r/g Pulmonary/Chest: normal work of breathing on room air, lungs clear to auscultation bilaterally Abdominal: soft, non-tender, non-distended, normal BS MSK: normal bulk and tone Neurological: alert & oriented x 3, no focal deficit Skin: warm and dry Psych: normal mood and behavior  Assessment & Plan:     Patient discussed with Dr. Dareen Piano  Diabetes type 2, controlled ([place]) A1c was checked 1.5 months ago and was noted to be elevated at 8.3% (up from 7.6% in May).  No changes were made to her diabetes regimen at that time, as she was admitted to the hospital due to abdominal pain secondary to her chronic pancreatitis.  Currently, the patient is on metformin 1000 mg twice daily and she has been tolerating this well.  She denies any polyuria, polydipsia, or vision changes.  Given she has no history of DKA and no history of recurrent UTIs, will start the patient on an sglt2i today for better glycemic control.    Plan: - Has ophtho but has not seen in one year; will call to make appt - Continue metformin 1 g bid - Start empagliflozin 10 mg daily - Follow up for A1c recheck in ~2 months - Urine micro today  Hyperlipidemia The patient was previously on pravastatin 20 mg for hyperlipidemia, but stopped taking it as it became cost prohibitive.  Last lipid panel was obtained in 2021 so we will repeat it today.  Plan: - Follow up lipid panel   Preventative health care - Already has an eye doctor that she follows with at [place] [facility]; will call to schedule an appt -Gave patient contact information for GI to schedule screening colonoscopy  Fatigue The patient states that over the past couple of months she has been more fatigued than usual.  She states that she has been the primary caregiver for her husband who recently had a stroke in June of this year and he is also undergoing cancer treatment.  On average, the patient sleeps about 4 hours at night and due to her husband's frequent hospitalizations, she is often sleeping at the hospital, where she is also not getting good rest.  She states that this has been quite stressful to her, but she denies any symptoms of anxiety or depression.  She does not feel that she has lost interest in any activities, despite her fatigue her energy level is generally okay throughout the day, and her diet has been stable.   I suspect that her fatigue is situational and related to being the primary caregiver of her husband/not getting good sleep.  The patient has been told by many loved ones that she needs to take care of herself, in order to be able to take care of her husband.  She understands this, but notes that it has been difficult and many of her medical conditions have been placed on the back burner because of his treatments.  Plan: -Will defer work-up of fatigue at this  time, as I suspect it is situational -Could check CBC, iron studies, vitamin D, and TSH if this  problem does not improve or worsens   Nashia Remus, D.O. [facility], PGY-2 Phone: [phone] Date 03/28/2022 Time 9:21 AM

## 2022-03-28 NOTE — Assessment & Plan Note (Signed)
-   Already has an eye doctor that she follows with at [place] [facility]; will call to schedule an appt -Gave patient contact information for GI to schedule screening colonoscopy

## 2022-03-28 NOTE — Patient Instructions (Signed)
Thank you, [name] for allowing [name] to provide your care today. Today we discussed:  Diabetes: Continue to take metformin 1000 mg twice a day. Please also start taking empagliflozin 10 mg once a day - this will help keep your sugar level under better control Please call your eye doctor to schedule an annual appointment Colonoscopy / pancreatitis: [facility] Gastroenterologist in [place], [place] in: [facility] [address] Address: [address], [place], [place] [address] Phone: [phone] Fatigue: likely from lack of sleep and life stressors. If this does not improve, can do labs in the future to evaluate this further  I have ordered the following labs for you:   Lab Orders         Microalbumin / Creatinine Urine Ratio         Lipid Profile      Referrals ordered today:   Referral Orders  No referral(s) requested today     I have ordered the following medication/changed the following medications:   Stop the following medications: There are no discontinued medications.   Start the following medications: Meds ordered this encounter  Medications   empagliflozin (JARDIANCE) 10 MG TABS tablet    Sig: Take 1 tablet (10 mg total) by mouth daily before breakfast.    Dispense:  30 tablet    Refill:  3     Follow up:  2 months for diabetes    Should you have any questions or concerns please call the internal medicine clinic at [phone].     Buddy Duty, D.O. [place]

## 2022-03-28 NOTE — Assessment & Plan Note (Signed)
The patient states that over the past couple of months she has been more fatigued than usual.  She states that she has been the primary caregiver for her husband who recently had a stroke in June of this year and he is also undergoing cancer treatment.  On average, the patient sleeps about 4 hours at night and due to her husband's frequent hospitalizations, she is often sleeping at the hospital, where she is also not getting good rest.  She states that this has been quite stressful to her, but she denies any symptoms of anxiety or depression.  She does not feel that she has lost interest in any activities, despite her fatigue her energy level is generally okay throughout the day, and her diet has been stable.   I suspect that her fatigue is situational and related to being the primary caregiver of her husband/not getting good sleep.  The patient has been told by many loved ones that she needs to take care of herself, in order to be able to take care of her husband.  She understands this, but notes that it has been difficult and many of her medical conditions have been placed on the back burner because of his treatments.  Plan: -Will defer work-up of fatigue at this time, as I suspect it is situational -Could check CBC, iron studies, vitamin D, and TSH if this problem does not improve or worsens

## 2022-03-28 NOTE — Assessment & Plan Note (Signed)
The patient was previously on pravastatin 20 mg for hyperlipidemia, but stopped taking it as it became cost prohibitive.  Last lipid panel was obtained in 2021 so we will repeat it today.  Plan: - Follow up lipid panel

## 2022-03-28 NOTE — Assessment & Plan Note (Addendum)
A1c was checked 1.5 months ago and was noted to be elevated at 8.3% (up from 7.6% in May).  No changes were made to her diabetes regimen at that time, as she was admitted to the hospital due to abdominal pain secondary to her chronic pancreatitis.  Currently, the patient is on metformin 1000 mg twice daily and she has been tolerating this well.  She denies any polyuria, polydipsia, or vision changes.  Given she has no history of DKA and no history of recurrent UTIs, will start the patient on an sglt2i today for better glycemic control.   Plan: - Has ophtho but has not seen in one year; will call to make appt - Continue metformin 1 g bid - Start empagliflozin 10 mg daily - Follow up for A1c recheck in ~2 months - Urine micro today

## 2022-03-29 ENCOUNTER — Other Ambulatory Visit: Payer: Self-pay | Admitting: Internal Medicine

## 2022-03-29 LAB — LIPID PANEL
Chol/HDL Ratio: 2.7 ratio (ref 0.0–4.4)
Cholesterol, Total: 224 mg/dL — ABNORMAL HIGH (ref 100–199)
HDL: 83 mg/dL (ref >39)
LDL Chol Calc (NIH): 128 mg/dL — ABNORMAL HIGH (ref 0–99)
Triglycerides: 74 mg/dL (ref 0–149)
VLDL Cholesterol Cal: 13 mg/dL (ref 5–40)

## 2022-03-29 LAB — MICROALBUMIN / CREATININE URINE RATIO
Creatinine, Urine: 48.6 mg/dL
Microalb/Creat Ratio: <6 mg/g creat (ref 0–29)
Microalbumin, Urine: <3 ug/mL

## 2022-03-29 MED ORDER — ROSUVASTATIN CALCIUM 20 MG PO TABS: 20.0000 mg | ORAL_TABLET | Freq: Every day | ORAL | 11 refills | Status: DC

## 2022-03-29 NOTE — Progress Notes (Signed)
Total cholesterol and LDL elevated at 224 and 128 respectively. Patient's ASCVD risk score is 11.9-21.5% - will start patient on high intensity statin. Patient called and made aware.

## 2022-03-29 NOTE — Progress Notes (Signed)
Urine microalbumin/creatinine ratio within normal limits. No changes made to treatment plan at this time.

## 2022-03-30 ENCOUNTER — Encounter: Payer: Self-pay | Admitting: Internal Medicine

## 2022-03-30 NOTE — Progress Notes (Signed)
Internal Medicine Clinic Attending ° °Case discussed with Dr. Atway  At the time of the visit.  We reviewed the resident’s history and exam and pertinent patient test results.  I agree with the assessment, diagnosis, and plan of care documented in the resident’s note.  °

## 2022-04-11 NOTE — Addendum Note (Signed)
Addended by: Truddie Crumble on: 04/11/2022 09:36 AM   Modules accepted: Orders

## 2022-04-13 ENCOUNTER — Other Ambulatory Visit: Payer: Self-pay | Admitting: Internal Medicine

## 2022-04-13 DIAGNOSIS — Q453 Other congenital malformations of pancreas and pancreatic duct: Secondary | ICD-10-CM

## 2022-05-11 ENCOUNTER — Ambulatory Visit
Admission: RE | Admit: 2022-05-11 | Discharge: 2022-05-11 | Disposition: A | Discharge status: Home / Self Care | Payer: Medicare HMO | Source: Ambulatory Visit | Attending: Internal Medicine | Admitting: Internal Medicine

## 2022-05-11 DIAGNOSIS — Z1231 Encounter for screening mammogram for malignant neoplasm of breast: Secondary | ICD-10-CM | POA: Diagnosis not present

## 2022-05-11 DIAGNOSIS — Z0000 Encounter for general adult medical examination without abnormal findings: Secondary | ICD-10-CM

## 2022-05-12 ENCOUNTER — Other Ambulatory Visit: Payer: Self-pay | Admitting: Internal Medicine

## 2022-05-12 DIAGNOSIS — Q453 Other congenital malformations of pancreas and pancreatic duct: Secondary | ICD-10-CM

## 2022-05-17 DIAGNOSIS — K861 Other chronic pancreatitis: Secondary | ICD-10-CM | POA: Diagnosis not present

## 2022-05-17 DIAGNOSIS — R1012 Left upper quadrant pain: Secondary | ICD-10-CM | POA: Diagnosis not present

## 2022-05-17 DIAGNOSIS — M542 Cervicalgia: Secondary | ICD-10-CM | POA: Diagnosis not present

## 2022-05-17 DIAGNOSIS — Z79899 Other long term (current) drug therapy: Secondary | ICD-10-CM | POA: Diagnosis not present

## 2022-05-17 DIAGNOSIS — Z5181 Encounter for therapeutic drug level monitoring: Secondary | ICD-10-CM | POA: Diagnosis not present

## 2022-05-17 DIAGNOSIS — M5412 Radiculopathy, cervical region: Secondary | ICD-10-CM | POA: Diagnosis not present

## 2022-05-30 ENCOUNTER — Encounter: Payer: Self-pay | Admitting: Student

## 2022-05-30 ENCOUNTER — Ambulatory Visit (INDEPENDENT_AMBULATORY_CARE_PROVIDER_SITE_OTHER): Payer: Medicare HMO | Admitting: Student

## 2022-05-30 VITALS — BP 99/67 | HR 86 | Temp 98.0°F | Ht 60.0 in | Wt 106.9 lb

## 2022-05-30 DIAGNOSIS — E1165 Type 2 diabetes mellitus with hyperglycemia: Secondary | ICD-10-CM | POA: Diagnosis not present

## 2022-05-30 DIAGNOSIS — Z23 Encounter for immunization: Secondary | ICD-10-CM

## 2022-05-30 DIAGNOSIS — R5383 Other fatigue: Secondary | ICD-10-CM

## 2022-05-30 DIAGNOSIS — I10 Essential (primary) hypertension: Secondary | ICD-10-CM | POA: Diagnosis not present

## 2022-05-30 DIAGNOSIS — E119 Type 2 diabetes mellitus without complications: Secondary | ICD-10-CM

## 2022-05-30 DIAGNOSIS — Z87891 Personal history of nicotine dependence: Secondary | ICD-10-CM

## 2022-05-30 DIAGNOSIS — Z0000 Encounter for general adult medical examination without abnormal findings: Secondary | ICD-10-CM

## 2022-05-30 DIAGNOSIS — D573 Sickle-cell trait: Secondary | ICD-10-CM | POA: Diagnosis not present

## 2022-05-30 DIAGNOSIS — T733XXS Exhaustion due to excessive exertion, sequela: Secondary | ICD-10-CM

## 2022-05-30 LAB — POCT GLYCOSYLATED HEMOGLOBIN (HGB A1C): Hemoglobin A1C: 8.1 % — AB (ref 4.0–5.6)

## 2022-05-30 LAB — GLUCOSE, CAPILLARY: Glucose-Capillary: 191 mg/dL — ABNORMAL HIGH (ref 70–99)

## 2022-05-30 NOTE — Patient Instructions (Signed)
Thank you, [name] for allowing [name] to provide your care today. Today we discussed your diabetes.  Your A1c is still above 8% so I want you to make some changes in your diet to help improve your blood sugar.  Also encourage you to start taking your Jardiance daily.  Please call your GI doctor to schedule a colonoscopy. Their information is below [facility] [place], [place] [address]  [phone]   I have ordered the following labs for you:   Lab Orders         Glucose, capillary         POC Hbg A1C      I have place a referrals to ophthalmology for diabetic eye exam  I have ordered the following medication/changed the following medications:  Please start Jardiance 10 mg daily  My Chart Access: [email or web address]  Please follow-up in 3 months  Please make sure to arrive 15 minutes prior to your next appointment. If you arrive late, you may be asked to reschedule.    We look forward to seeing you next time. Please call our clinic at [phone] if you have any questions or concerns. The best time to call is Monday-Friday from 9am-4pm, but there is someone available 24/7. If after hours or the weekend, call the main hospital number and ask for the Internal Medicine Resident On-Call. If you need medication refills, please notify your pharmacy one week in advance and they will send [name] a request.   Thank you for letting [name] take part in your care. Wishing you the best!  Lacinda Axon, MD 05/30/2022, 1:55 PM IM Resident, PGY-3 [place] [facility] 41:10

## 2022-05-30 NOTE — Progress Notes (Signed)
CC: Diabetes follow-up  HPI:  [name] is a 67 y.o. female with PMH as below who presents to clinic to follow-up on her diabetes. Please see problem based charting for evaluation, assessment and plan.  Past Medical History:  Diagnosis Date   Abdominal pain 02/02/2022   Abdominal pain, chronic, left upper quadrant 09/21/2016   Overview:  Added automatically from request for surgery [id number]   Ankle injury, right, initial encounter 04/08/2020   IOV 04/08/20   Arthralgia 01/19/2018   Beta thalassemia (HCC)    Blood dyscrasia    thalasemia, sickle cell trait   Childhood asthma    Chronic abdominal pain    Unclear etiology, thought to be due to chronic pancreatitis previously in setting of her pancreatic divisum - however, EUS and EGD performed at [place]  (10/05/2011) showing normal esophageall, gastric, duodenal mucosa. EUS showing no pancreatic masses, cysts, or changes of chronic pancreatitis. Biliary system nondilated and had no endosonographic abnormalities.   Colon cancer screening 05/23/2016   CVA (cerebral vascular accident) [facility]) 1993   Per report by patient she had a stroke and prolonged rehab course; still "weak on left side but not much" (03/29/2017)   Encounter for screening mammogram for breast cancer 12/02/2016   Family history of adverse reaction to anesthesia    "mom took a long while to wake up; she was allergic to it" (03/29/2017)   Grief reaction 02/24/2017   History of blood transfusion 1980s   "related to minor sickle cell crisis" (03/29/2017)   HLD (hyperlipidemia) 2009   Hypertension    Iron deficiency anemia 11/16/2011   Migraine headache    "q couple years now" (03/29/2017)   Otitis media 08/24/2021   Palpitation 07/18/2016   Pancreatic divisum    S/P ERCP with stenting 07/2010 at [facility], then stent removal (08/05/2010)   Pneumonia    "had it in my teens 3 times" (03/29/2017)   Pulmonary embolus ([place]) 08/2004   Two areas of V/Q  mismatch. Findings compatible  with high probability for pulmonary embolus.; pt was on coumadin for 1 year   Right calf pain 05/18/2018   Seizures ([place]) 1993   "after the stroke in 1993; none for years now"  (03/29/2017)   Sickle cell trait ([place])    Thalassemia    Type II diabetes mellitus ([place]) 2010   well controlled   Uterine cancer ([place])     Review of Systems:  Constitutional: Positive for occasional fatigue. Negative for fever or polyuria Eyes: Negative for visual changes Respiratory: Negative for shortness of breath Abdomen: Positive for occasional bloating, abdominal discomfort and nausea. Negative for vomiting or constipation. Neuro: Negative for headache or weakness  Physical Exam: General: Pleasant, well-appearing elderly woman. No acute distress. Cardiac: RRR. No murmurs, rubs or gallops. No LE edema Respiratory: Lungs CTAB. No wheezing or crackles. Abdominal: Soft, symmetric and non tender. Normal BS. Skin: Warm, dry and intact without rashes or lesions Extremities: Atraumatic. Full ROM. Palpable radial and DP pulses. Neuro: A&O x 3. Moves all extremities.  Normal sensation to gross touch. Psych: Appropriate mood and affect.  Vitals:   05/30/22 1317  BP: 99/67  Pulse: 86  Temp: 98 F (36.7 C)  TempSrc: Oral  SpO2: 100%  Weight: 106 lb 14.4 oz (48.5 kg)  Height: 5' (1.524 m)    Assessment & Plan:   Uncontrolled type 2 diabetes mellitus with hyperglycemia (HCC) Patient here for follow-up on her diabetes. A1c today slightly improved to 8.1% from 8.3%  2 months ago. Patient was started on Jardiance during last office visit however she informed me that she has not taken it because her son states it might not be good for her. She has been working on modifying her diet to improve her diabetes.  Because of her pancreatic insufficiency, she has to be careful about what she eats otherwise she gets bloated and her stomach starts hurting.  Discussed with patient plan to refer  her to [name] to help with nutrition counseling. Educated patient on side effects of Jardiance and advised her to try it and follow-up if she is unable to tolerate it. Patient agreeable to start Jardiance.  Plan: -Continue metformin 1000 mg twice daily -Refilled Jardiance 10 mg daily -Follow-up in 3 months for repeat A1c -Ophthalmology referral for diabetic eye exam -Referral to diabetic educator for nutrition counseling  Preventative health care Patient received the Prevnar-20 vaccine today. I provided her the information for her GI doctor for her to call for colonoscopy. I also referred her to ophthalmology for diabetic eye exam.  Essential hypertension BP is well-controlled on current regimen.  Blood pressure is slightly soft today.  Patient denies any headaches, dizziness or vision changes.  Advised her to drink 2-3 bottles of water per day to stay hydrated. Vitals:   05/30/22 1317  BP: 99/67   Plan: -Continue lisinopril-HCTZ 20-12.5 mg daily  Fatigue Patient states her fatigue has improved since the holidays.  She has a lot more energy now.  States she tries not to exert herself too much because of her thalassemia. -CTM   See Encounters Tab for problem based charting.  Patient discussed with Dr. Lorenz Coaster, MD, MPH

## 2022-05-31 ENCOUNTER — Encounter: Payer: Self-pay | Admitting: Student

## 2022-05-31 MED ORDER — EMPAGLIFLOZIN 10 MG PO TABS: 10.0000 mg | ORAL_TABLET | Freq: Every day | ORAL | 5 refills | Status: DC

## 2022-05-31 NOTE — Assessment & Plan Note (Signed)
Patient states her fatigue has improved since the holidays.  She has a lot more energy now.  States she tries not to exert herself too much because of her thalassemia. -CTM

## 2022-05-31 NOTE — Assessment & Plan Note (Addendum)
Patient here for follow-up on her diabetes. A1c today slightly improved to 8.1% from 8.3% 2 months ago. Patient was started on Jardiance during last office visit however she informed me that she has not taken it because her son states it might not be good for her. She has been working on modifying her diet to improve her diabetes.  Because of her pancreatic insufficiency, she has to be careful about what she eats otherwise she gets bloated and her stomach starts hurting.  Discussed with patient plan to refer her to [name] to help with nutrition counseling. Educated patient on side effects of Jardiance and advised her to try it and follow-up if she is unable to tolerate it. Patient agreeable to start Jardiance.  Plan: -Continue metformin 1000 mg twice daily -Refilled Jardiance 10 mg daily -Follow-up in 3 months for repeat A1c -Ophthalmology referral for diabetic eye exam -Referral to diabetic educator for nutrition counseling

## 2022-05-31 NOTE — Assessment & Plan Note (Signed)
BP is well-controlled on current regimen.  Blood pressure is slightly soft today.  Patient denies any headaches, dizziness or vision changes.  Advised her to drink 2-3 bottles of water per day to stay hydrated. Vitals:   05/30/22 1317  BP: 99/67   Plan: -Continue lisinopril-HCTZ 20-12.5 mg daily

## 2022-05-31 NOTE — Assessment & Plan Note (Signed)
Patient received the Prevnar-20 vaccine today. I provided her the information for her GI doctor for her to call for colonoscopy. I also referred her to ophthalmology for diabetic eye exam.

## 2022-06-03 NOTE — Progress Notes (Signed)
Internal Medicine Clinic Attending  Case discussed with the resident at the time of the visit.  We reviewed the resident's history and exam and pertinent patient test results.  I agree with the assessment, diagnosis, and plan of care documented in the resident's note.  

## 2022-06-06 ENCOUNTER — Other Ambulatory Visit: Payer: Self-pay | Admitting: Student

## 2022-06-06 DIAGNOSIS — Q453 Other congenital malformations of pancreas and pancreatic duct: Secondary | ICD-10-CM

## 2022-07-05 ENCOUNTER — Other Ambulatory Visit: Payer: Self-pay | Admitting: Internal Medicine

## 2022-07-05 DIAGNOSIS — Q453 Other congenital malformations of pancreas and pancreatic duct: Secondary | ICD-10-CM

## 2022-07-07 ENCOUNTER — Encounter: Payer: Self-pay | Admitting: Internal Medicine

## 2022-07-12 DIAGNOSIS — R1012 Left upper quadrant pain: Secondary | ICD-10-CM | POA: Diagnosis not present

## 2022-08-02 ENCOUNTER — Other Ambulatory Visit: Payer: Self-pay | Admitting: Internal Medicine

## 2022-08-02 DIAGNOSIS — Q453 Other congenital malformations of pancreas and pancreatic duct: Secondary | ICD-10-CM

## 2022-08-03 NOTE — Telephone Encounter (Signed)
Next appt scheduled 4/29 with Dr Allyson Sabal.

## 2022-08-29 ENCOUNTER — Encounter: Payer: Self-pay | Admitting: Student

## 2022-08-29 ENCOUNTER — Ambulatory Visit (INDEPENDENT_AMBULATORY_CARE_PROVIDER_SITE_OTHER): Payer: Medicare HMO | Admitting: Student

## 2022-08-29 VITALS — BP 139/70 | HR 77 | Temp 98.2°F | Ht 60.0 in | Wt 106.2 lb

## 2022-08-29 DIAGNOSIS — G40909 Epilepsy, unspecified, not intractable, without status epilepticus: Secondary | ICD-10-CM

## 2022-08-29 DIAGNOSIS — E119 Type 2 diabetes mellitus without complications: Secondary | ICD-10-CM

## 2022-08-29 DIAGNOSIS — Z0000 Encounter for general adult medical examination without abnormal findings: Secondary | ICD-10-CM

## 2022-08-29 DIAGNOSIS — E1165 Type 2 diabetes mellitus with hyperglycemia: Secondary | ICD-10-CM | POA: Diagnosis not present

## 2022-08-29 DIAGNOSIS — I10 Essential (primary) hypertension: Secondary | ICD-10-CM | POA: Diagnosis not present

## 2022-08-29 DIAGNOSIS — Q453 Other congenital malformations of pancreas and pancreatic duct: Secondary | ICD-10-CM

## 2022-08-29 DIAGNOSIS — Z13820 Encounter for screening for osteoporosis: Secondary | ICD-10-CM

## 2022-08-29 DIAGNOSIS — D573 Sickle-cell trait: Secondary | ICD-10-CM | POA: Diagnosis not present

## 2022-08-29 LAB — POCT GLYCOSYLATED HEMOGLOBIN (HGB A1C): Hemoglobin A1C: 7.4 % — AB (ref 4.0–5.6)

## 2022-08-29 LAB — GLUCOSE, CAPILLARY: Glucose-Capillary: 166 mg/dL — ABNORMAL HIGH (ref 70–99)

## 2022-08-29 MED ORDER — EMPAGLIFLOZIN 10 MG PO TABS: 10.0000 mg | ORAL_TABLET | Freq: Every day | ORAL | 5 refills | Status: DC

## 2022-08-29 MED ORDER — PROMETHAZINE HCL 12.5 MG PO TABS
ORAL_TABLET | ORAL | 1 refills | Status: DC
Start: 2022-08-29 — End: 2022-09-28

## 2022-08-29 NOTE — Assessment & Plan Note (Signed)
Patient with pancreatic divisum (congenital). She has since developed chronic pancreatitis from this and is on creon therapy. She is also on promethazine prn to help with nausea from this condition, refilled today. This is chronic and stable, no recent issues with this problem.  -continue creon -promethazine refilled today

## 2022-08-29 NOTE — Progress Notes (Signed)
CC: f/u HTN, T2DM, preventative healthcare  HPI:  [name] is a 67 y.o. female with history listed below presenting to the [facility] for f/u HTN, T2DM, preventative healthcare. Please see individualized problem based charting for full HPI.  Past Medical History:  Diagnosis Date   Abdominal pain 02/02/2022   Abdominal pain, chronic, left upper quadrant 09/21/2016   Overview:  Added automatically from request for surgery [id number]   Ankle injury, right, initial encounter 04/08/2020   IOV 04/08/20   Arthralgia 01/19/2018   Beta thalassemia (HCC)    Blood dyscrasia    thalasemia, sickle cell trait   Childhood asthma    Chronic abdominal pain    Unclear etiology, thought to be due to chronic pancreatitis previously in setting of her pancreatic divisum - however, EUS and EGD performed at [facility]  (10/05/2011) showing normal esophageall, gastric, duodenal mucosa. EUS showing no pancreatic masses, cysts, or changes of chronic pancreatitis. Biliary system nondilated and had no endosonographic abnormalities.   Colon cancer screening 05/23/2016   CVA (cerebral vascular accident) [facility]) 1993   Per report by patient she had a stroke and prolonged rehab course; still "weak on left side but not much" (03/29/2017)   Encounter for screening mammogram for breast cancer 12/02/2016   Family history of adverse reaction to anesthesia    "mom took a long while to wake up; she was allergic to it" (03/29/2017)   Grief reaction 02/24/2017   History of blood transfusion 1980s   "related to minor sickle cell crisis" (03/29/2017)   HLD (hyperlipidemia) 2009   Hypertension    Iron deficiency anemia 11/16/2011   Migraine headache    "q couple years now" (03/29/2017)   Otitis media 08/24/2021   Palpitation 07/18/2016   Pancreatic divisum    S/P ERCP with stenting 07/2010 at [facility], then stent removal (08/05/2010)   Pneumonia    "had it in my teens 3 times" (03/29/2017)   Pulmonary embolus (HCC)  08/2004   Two areas of V/Q mismatch. Findings compatible  with high probability for pulmonary embolus.; pt was on coumadin for 1 year   Right calf pain 05/18/2018   Seizures (HCC) 1993   "after the stroke in 1993; none for years now"  (03/29/2017)   Sickle cell trait (HCC)    Thalassemia    Type II diabetes mellitus (HCC) 2010   well controlled   Uterine cancer (HCC)     Review of Systems:  Negative aside from that listed in individualized problem based charting.  Physical Exam:  Vitals:   08/29/22 1316 08/29/22 1332  BP: (!) 141/79 139/70  Pulse: 85 77  Temp: 98.2 F (36.8 C)   TempSrc: Oral   SpO2: 100%   Weight: 106 lb 3.2 oz (48.2 kg)   Height: 5' (1.524 m)    Physical Exam Constitutional:      Appearance: Normal appearance. She is normal weight. She is not ill-appearing.  HENT:     Mouth/Throat:     Mouth: Mucous membranes are moist.     Pharynx: Oropharynx is clear. No oropharyngeal exudate.  Eyes:     General: No scleral icterus.    Extraocular Movements: Extraocular movements intact.     Conjunctiva/sclera: Conjunctivae normal.     Pupils: Pupils are equal, round, and reactive to light.  Cardiovascular:     Rate and Rhythm: Normal rate and regular rhythm.     Heart sounds: Normal heart sounds. No murmur heard.    No friction rub.  No gallop.  Pulmonary:     Effort: Pulmonary effort is normal. No respiratory distress.     Breath sounds: Normal breath sounds. No wheezing, rhonchi or rales.  Abdominal:     General: Bowel sounds are normal. There is no distension.     Palpations: Abdomen is soft.     Tenderness: There is no abdominal tenderness. There is no guarding or rebound.  Musculoskeletal:        General: No swelling. Normal range of motion.  Skin:    General: Skin is warm and dry.  Neurological:     General: No focal deficit present.     Mental Status: She is alert and oriented to person, place, and time.  Psychiatric:        Mood and Affect: Mood  normal.        Behavior: Behavior normal.      Assessment & Plan:   See Encounters Tab for problem based charting.  Patient discussed with Dr.  Lafonda Mosses

## 2022-08-29 NOTE — Assessment & Plan Note (Signed)
BP 141/79 with repeat 139/70, at goal. Currently on lisinopril-hctz 20-12.5mg  daily.   -continue current regimen

## 2022-08-29 NOTE — Assessment & Plan Note (Signed)
Patient here for T2DM f/u. Her A1c was 8.1% about 3 months ago, has improved to 7.4% today. She is on metformin therapy. She has not yet started jardiance therapy due to concern for side effects. We discussed these in details (risk for yeast infection, etc.) and she is amenable to starting jardiance therapy now. Her A1c, although improved, remains above goal but addition of jardiance should help her achieve optimal glycemic control.   She received a letter from [facility] for diabetic eye exam but she has not yet called them to schedule. I have advised her to reach out and schedule an appointment.  Plan: -continue metformin -start jardiance 10mg  daily -call [facility] to schedule eye exam -f/u in 3 months for repeat A1c

## 2022-08-29 NOTE — Assessment & Plan Note (Signed)
Patient reports history of seizure activity (multiple episodes) after CVA in the 1990s. She was on AEDs for a while but became seizure free by the late 1990s. Her AEDs were stopped in 2000 prior to her moving to [facility] and she has remained seizure-free since that time.

## 2022-08-29 NOTE — Patient Instructions (Addendum)
[name],  It was a pleasure seeing you in the clinic today.   For your diabetes, your A1c level is better but still not exactly where we need it. As we discussed, please start taking jardiance once daily to help improve your sugar control. I have sent this along with the promethazine to your pharmacy. [facility] call [facility] to schedule your bone scan to check for osteoporosis. Their number is [phone].  Please reach out to your stomach doctor to schedule a colonoscopy to screen for colon cancer. Please reach out to your eye doctor for your eye exam. Please come back to see [name] in 3 months (or sooner if needed).  Please call our clinic at [phone] if you have any questions or concerns. The best time to call is Monday-Friday from 9am-4pm, but there is someone available 24/7 at the same number. If you need medication refills, please notify your pharmacy one week in advance and they will send [name] a request.   Thank you for letting us take part in your care. We look forward to seeing you next time!

## 2022-08-29 NOTE — Assessment & Plan Note (Signed)
Patient has established with GI at [facility]. Advised her to reach out to her GI physician for screening colonoscopy as she is due for one.   DEXA scan ordered during a previous visit but patient was unaware. Have provided number to [facility] to schedule DEXA scan.

## 2022-09-01 NOTE — Progress Notes (Signed)
Internal Medicine Clinic Attending  Case discussed with Dr. Jinwala  At the time of the visit.  We reviewed the resident's history and exam and pertinent patient test results.  I agree with the assessment, diagnosis, and plan of care documented in the resident's note.  

## 2022-09-13 DIAGNOSIS — Z79899 Other long term (current) drug therapy: Secondary | ICD-10-CM | POA: Diagnosis not present

## 2022-09-13 DIAGNOSIS — R1012 Left upper quadrant pain: Secondary | ICD-10-CM | POA: Diagnosis not present

## 2022-09-13 DIAGNOSIS — Z5181 Encounter for therapeutic drug level monitoring: Secondary | ICD-10-CM | POA: Diagnosis not present

## 2022-09-23 ENCOUNTER — Other Ambulatory Visit: Payer: Self-pay | Admitting: Student

## 2022-09-23 DIAGNOSIS — Q453 Other congenital malformations of pancreas and pancreatic duct: Secondary | ICD-10-CM

## 2022-10-05 ENCOUNTER — Other Ambulatory Visit: Payer: Self-pay | Admitting: Student

## 2022-10-25 ENCOUNTER — Other Ambulatory Visit: Payer: Self-pay | Admitting: Student

## 2022-10-25 DIAGNOSIS — Q453 Other congenital malformations of pancreas and pancreatic duct: Secondary | ICD-10-CM

## 2022-11-17 DIAGNOSIS — R1012 Left upper quadrant pain: Secondary | ICD-10-CM | POA: Diagnosis not present

## 2022-11-17 DIAGNOSIS — K861 Other chronic pancreatitis: Secondary | ICD-10-CM | POA: Diagnosis not present

## 2022-11-25 ENCOUNTER — Other Ambulatory Visit: Payer: Self-pay | Admitting: Internal Medicine

## 2022-11-25 DIAGNOSIS — Q453 Other congenital malformations of pancreas and pancreatic duct: Secondary | ICD-10-CM

## 2022-12-15 ENCOUNTER — Emergency Department (HOSPITAL_COMMUNITY)
Admission: EM | Admit: 2022-12-15 | Discharge: 2022-12-15 | Disposition: A | Discharge status: Home / Self Care | Payer: Medicare HMO | Attending: Emergency Medicine | Admitting: Emergency Medicine

## 2022-12-15 DIAGNOSIS — I10 Essential (primary) hypertension: Secondary | ICD-10-CM | POA: Diagnosis not present

## 2022-12-15 DIAGNOSIS — Z79899 Other long term (current) drug therapy: Secondary | ICD-10-CM | POA: Diagnosis not present

## 2022-12-15 DIAGNOSIS — E119 Type 2 diabetes mellitus without complications: Secondary | ICD-10-CM | POA: Insufficient documentation

## 2022-12-15 DIAGNOSIS — H9202 Otalgia, left ear: Secondary | ICD-10-CM | POA: Diagnosis not present

## 2022-12-15 DIAGNOSIS — Z1152 Encounter for screening for COVID-19: Secondary | ICD-10-CM | POA: Diagnosis not present

## 2022-12-15 DIAGNOSIS — R220 Localized swelling, mass and lump, head: Secondary | ICD-10-CM | POA: Diagnosis present

## 2022-12-15 DIAGNOSIS — Z7984 Long term (current) use of oral hypoglycemic drugs: Secondary | ICD-10-CM | POA: Diagnosis not present

## 2022-12-15 DIAGNOSIS — J0100 Acute maxillary sinusitis, unspecified: Secondary | ICD-10-CM | POA: Insufficient documentation

## 2022-12-15 DIAGNOSIS — Z8542 Personal history of malignant neoplasm of other parts of uterus: Secondary | ICD-10-CM | POA: Diagnosis not present

## 2022-12-15 LAB — SARS CORONAVIRUS 2 BY RT PCR: SARS Coronavirus 2 by RT PCR: NEGATIVE

## 2022-12-15 MED ORDER — AMOXICILLIN-POT CLAVULANATE 875-125 MG PO TABS
1.0000 | ORAL_TABLET | Freq: Once | ORAL | Status: AC
  Administered 2022-12-15: 1 via ORAL
  Filled 2022-12-15: qty 1

## 2022-12-15 MED ORDER — OXYMETAZOLINE HCL 0.05 % NA SOLN
1.0000 | Freq: Once | NASAL | Status: AC
  Administered 2022-12-15: 1 via NASAL
  Filled 2022-12-15: qty 30

## 2022-12-15 MED ORDER — LORATADINE 10 MG PO TABS
10.0000 mg | ORAL_TABLET | Freq: Once | ORAL | Status: AC
  Administered 2022-12-15: 10 mg via ORAL
  Filled 2022-12-15: qty 1

## 2022-12-15 MED ORDER — MORPHINE SULFATE (PF) 4 MG/ML IV SOLN
4.0000 mg | Freq: Once | INTRAVENOUS | Status: AC
  Administered 2022-12-15: 4 mg via INTRAMUSCULAR
  Filled 2022-12-15: qty 1

## 2022-12-15 MED ORDER — AMOXICILLIN-POT CLAVULANATE 875-125 MG PO TABS: 1.0000 | ORAL_TABLET | Freq: Two times a day (BID) | ORAL | 0 refills | Status: DC

## 2022-12-15 NOTE — ED Triage Notes (Signed)
Pt c/o L sided facial swelling and blurry L eye vision since yesterday. Pt denies known injury. Has a spot behind her L eye that is discolored but she states it is a birth mark. Pt is on Lisinopril. Denies any trouble breathing.

## 2022-12-15 NOTE — ED Provider Notes (Signed)
[place] EMERGENCY DEPARTMENT AT [facility] Provider Note   CSN: [id number] Arrival date & time: 12/15/22  1041     History  Chief Complaint  Patient presents with   Facial Swelling    [name] is a 67 y.o. female.  Pt is a 67 yo female with pmhx significant for htn, hld, sickle cell trait, PE (not on thinners), pancreatitis, beta thalassemia, endometrial cancer, dm2, cva, seizures, and migraines.  Pt said she has some swelling to the left side of her face.  Sx started yesterday.  Pt said pain is worse when she lays on the left side of her body.  She has post nasal drip.  She denies sob.  She does have pain to her left ear.       Home Medications Prior to Admission medications   Medication Sig Start Date End Date Taking? Authorizing Provider  amoxicillin-clavulanate (AUGMENTIN) 875-125 MG tablet Take 1 tablet by mouth every 12 (twelve) hours. 12/15/22  Yes Jacalyn Lefevre, MD  rosuvastatin (CRESTOR) 20 MG tablet Take 1 tablet (20 mg total) by mouth daily. 03/29/22   Atway, Derwood Kaplan, DO  acetaminophen (TYLENOL) 500 MG tablet Take 2 tablets (1,000 mg total) by mouth every 8 (eight) hours as needed for mild pain or moderate pain. 02/03/22   Marrianne Mood, MD  calcium carbonate (TUMS - DOSED IN MG ELEMENTAL CALCIUM) 500 MG chewable tablet Chew 1 tablet by mouth daily as needed for indigestion or heartburn.    [provider]  empagliflozin (JARDIANCE) 10 MG TABS tablet Take 1 tablet (10 mg total) by mouth daily before breakfast. 08/29/22   Merrilyn Puma, MD  fluticasone (FLONASE) 50 MCG/ACT nasal spray Place 2 sprays into both nostrils daily. Patient not taking: Reported on 02/02/2022 08/24/21 08/24/22  Dolan Amen, MD  lipase/protease/amylase (CREON) 36000 UNITS CPEP capsule TAKE 2 CAPSULES BY MOUTH WITH EACH MEAL. 1 CAPSULE WITH EACH [facility] 02/25/22   Merrilyn Puma, MD  lisinopril-hydrochlorothiazide (ZESTORETIC) 20-12.5 MG tablet TAKE 1 TABLET  BY MOUTH DAILY 10/07/22   Atway, Rayann N, DO  metFORMIN (GLUCOPHAGE) 1000 MG tablet Take 1 tablet (1,000 mg total) by mouth 2 (two) times daily with a meal. 02/25/22   Merrilyn Puma, MD  morphine (MS CONTIN) 30 MG 12 hr tablet Take 30 mg by mouth 2 (two) times daily. 11/12/18   [provider]  Multiple Vitamin (MULTIVITAMIN PO) Take 1 tablet by mouth daily.    [provider]  promethazine (PHENERGAN) 12.5 MG tablet TAKE 1 TABLET(12.5 MG) BY MOUTH EVERY 8 HOURS AS NEEDED 11/25/22   Atway, Rayann N, DO      Allergies    Butalbital-aspirin-caffeine, Fioricet [butalbital-apap-caffeine], Shellfish-derived products, Shrimp flavor, Sulfonamide derivatives, Aspirin, and Sulfamethoxazole    Review of Systems   Review of Systems  HENT:  Positive for facial swelling, postnasal drip and rhinorrhea.   All other systems reviewed and are negative.   Physical Exam Updated Vital Signs BP (!) 156/81 (BP Location: Left Arm)   Pulse (!) 101   Temp 98.3 F (36.8 C) (Oral)   Resp 18   SpO2 98%  Physical Exam Vitals and nursing note reviewed.  Constitutional:      Appearance: Normal appearance.  HENT:     Head: Normocephalic and atraumatic.     Comments: Swelling over left maxilla sinus;     Right Ear: External ear normal.     Left Ear: External ear normal.     Mouth/Throat:  Mouth: Mucous membranes are moist.     Pharynx: Oropharynx is clear.  Eyes:     Extraocular Movements: Extraocular movements intact.     Conjunctiva/sclera: Conjunctivae normal.     Pupils: Pupils are equal, round, and reactive to light.     Comments: No visual problems once swollen tissue on face pushed down  Cardiovascular:     Rate and Rhythm: Normal rate and regular rhythm.     Pulses: Normal pulses.     Heart sounds: Normal heart sounds.  Pulmonary:     Effort: Pulmonary effort is normal.     Breath sounds: Normal breath sounds.  Abdominal:     General: Abdomen is flat. Bowel sounds are  normal.     Palpations: Abdomen is soft.  Musculoskeletal:        General: Normal range of motion.     Cervical back: Normal range of motion and neck supple.  Skin:    General: Skin is warm.     Capillary Refill: Capillary refill takes less than 2 seconds.  Neurological:     General: No focal deficit present.     Mental Status: She is alert and oriented to person, place, and time.  Psychiatric:        Mood and Affect: Mood normal.        Behavior: Behavior normal.     ED Results / Procedures / Treatments   Labs (all labs ordered are listed, but only abnormal results are displayed) Labs Reviewed  SARS CORONAVIRUS 2 BY RT PCR    EKG None  Radiology No results found.  Procedures Procedures    Medications Ordered in ED Medications  oxymetazoline (AFRIN) 0.05 % nasal spray 1 spray (1 spray Each Nare Given 12/15/22 1139)  loratadine (CLARITIN) tablet 10 mg (10 mg Oral Given 12/15/22 1139)  amoxicillin-clavulanate (AUGMENTIN) 875-125 MG per tablet 1 tablet (1 tablet Oral Given 12/15/22 1139)  morphine (PF) 4 MG/ML injection 4 mg (4 mg Intramuscular Given 12/15/22 1142)    ED Course/ Medical Decision Making/ A&P                                 Medical Decision Making Risk OTC drugs. Prescription drug management.   This patient presents to the ED for concern of facial swelling, this involves an extensive number of treatment options, and is a complaint that carries with it a high risk of complications and morbidity.  The differential diagnosis includes sinus infection, dental infection, covid   Co morbidities that complicate the patient evaluation  htn, hld, sickle cell trait, PE (not on thinners), pancreatitis, beta thalassemia, endometrial cancer, dm2, cva, seizures, and migraines   Additional history obtained:  Additional history obtained from epic chart review  Lab Tests:  I Ordered, and personally interpreted labs.  The pertinent results include:  covid  neg  Medicines ordered and prescription drug management:  I ordered medication including morphine/claritin/afrin/augmentin  for sinus infection  Reevaluation of the patient after these medicines showed that the patient improved I have reviewed the patients home medicines and have made adjustments as needed   Problem List / ED Course:  Sinusitis:  pt is stable for d/c.  Covid neg.  She's d/c with augmentin and is told to return if worse.    Reevaluation:  After the interventions noted above, I reevaluated the patient and found that they have :improved   Social Determinants of Health:  Lives at home   Dispostion:  After consideration of the diagnostic results and the patients response to treatment, I feel that the patent would benefit from discharge with outpatient f/u.          Final Clinical Impression(s) / ED Diagnoses Final diagnoses:  Acute non-recurrent maxillary sinusitis    Rx / DC Orders ED Discharge Orders          Ordered    amoxicillin-clavulanate (AUGMENTIN) 875-125 MG tablet  Every 12 hours        12/15/22 1205              Jacalyn Lefevre, MD 12/15/22 1207

## 2022-12-15 NOTE — Discharge Instructions (Signed)
Use the Afrin twice a day for 3 days only.

## 2022-12-22 ENCOUNTER — Other Ambulatory Visit: Payer: Self-pay | Admitting: Internal Medicine

## 2022-12-22 ENCOUNTER — Telehealth: Payer: Self-pay

## 2022-12-22 DIAGNOSIS — Q453 Other congenital malformations of pancreas and pancreatic duct: Secondary | ICD-10-CM

## 2022-12-22 NOTE — Telephone Encounter (Signed)
Transition Care Management Unsuccessful Follow-up Telephone Call  Date of discharge and from where:  12/15/2022 [facility]  Attempts:  1st Attempt  Reason for unsuccessful TCM follow-up call:  Left voice message  Sherae Santino [facility]  [facility] Population Health Community Resource Care Guide   [email or web address]  ?? [phone]   Website: [email or web address]  [email or web address]

## 2022-12-22 NOTE — Telephone Encounter (Signed)
Transition Care Management Unsuccessful Follow-up Telephone Call  Date of discharge and from where:  12/15/2022 [facility]  Attempts:  2nd Attempt  Reason for unsuccessful TCM follow-up call:  Left voice message  Kagan Mutchler [facility]  [facility] Population Health Community Resource Care Guide   [email or web address]  ?? [phone]   Website: [email or web address]  [email or web address]

## 2022-12-30 ENCOUNTER — Ambulatory Visit (INDEPENDENT_AMBULATORY_CARE_PROVIDER_SITE_OTHER): Payer: Medicare HMO | Admitting: Student

## 2022-12-30 ENCOUNTER — Encounter: Payer: Self-pay | Admitting: Student

## 2022-12-30 VITALS — BP 136/89 | HR 71 | Temp 98.6°F | Wt 107.6 lb

## 2022-12-30 DIAGNOSIS — Z7984 Long term (current) use of oral hypoglycemic drugs: Secondary | ICD-10-CM

## 2022-12-30 DIAGNOSIS — E119 Type 2 diabetes mellitus without complications: Secondary | ICD-10-CM | POA: Insufficient documentation

## 2022-12-30 DIAGNOSIS — E7849 Other hyperlipidemia: Secondary | ICD-10-CM

## 2022-12-30 DIAGNOSIS — Z23 Encounter for immunization: Secondary | ICD-10-CM | POA: Insufficient documentation

## 2022-12-30 DIAGNOSIS — Z13820 Encounter for screening for osteoporosis: Secondary | ICD-10-CM | POA: Insufficient documentation

## 2022-12-30 DIAGNOSIS — E1165 Type 2 diabetes mellitus with hyperglycemia: Secondary | ICD-10-CM

## 2022-12-30 DIAGNOSIS — I10 Essential (primary) hypertension: Secondary | ICD-10-CM | POA: Diagnosis not present

## 2022-12-30 LAB — POCT GLYCOSYLATED HEMOGLOBIN (HGB A1C): Hemoglobin A1C: 7.4 % — AB (ref 4.0–5.6)

## 2022-12-30 LAB — GLUCOSE, CAPILLARY: Glucose-Capillary: 129 mg/dL — ABNORMAL HIGH (ref 70–99)

## 2022-12-30 MED ORDER — DAPAGLIFLOZIN PROPANEDIOL 5 MG PO TABS
5.0000 mg | ORAL_TABLET | Freq: Every day | ORAL | 0 refills | Status: AC
Start: 2022-12-30 — End: 2023-01-29

## 2022-12-30 MED ORDER — METFORMIN HCL 1000 MG PO TABS
1000.0000 mg | ORAL_TABLET | Freq: Two times a day (BID) | ORAL | 2 refills | Status: DC
Start: 2022-12-30 — End: 2023-09-12

## 2022-12-30 NOTE — Patient Instructions (Addendum)
[name],Thank you for allowing me to take part in your care today.  Here are your instructions.  1.  Regarding your diabetes, continue taking metformin 1000 mg twice daily.  I will try to send in something called Marcelline Deist, try to see if this is more affordable.  Also you can talk to your pharmacist at the pharmacy to see which medication is the most affordable from your insurance. Your A1c today was 7.4.   2.  Regarding hypertension, continue taking your lisinopril-hydrochlorothiazide tablet daily.  3.  Will update your lipid panel and kidney function and electrolytes today.  Will call you with the results.  Will also call you with the results for your urine test today.  4.  Please come back in 3 months for A1c.   5. Please allow for you sinusitis symptoms some time to resolve, I do not see any evidence to give more antibiotics today.  6. Please return back earlier if you need to be seen.  7. I sent in for the farxiga, please see if this is more affordable for you.   Thank you, Dr. Allena Katz  If you have any other questions please contact the internal medicine clinic at [phone]

## 2022-12-30 NOTE — Progress Notes (Signed)
 Internal Medicine Clinic Attending  Case discussed with the resident physician at the time of the visit.  We reviewed the patient's history, exam, and pertinent patient test results.  I agree with the assessment, diagnosis, and plan of care documented in the resident's note.

## 2022-12-30 NOTE — Assessment & Plan Note (Signed)
Administered flu vaccination today

## 2022-12-30 NOTE — Assessment & Plan Note (Addendum)
Patient has past medical history of hypertension.  Her current medication includes lisinopril-hydrochlorothiazide 20-12.5 mg daily.  She reports adherence to this medication.  She states that she has been stressed out recently.  Overnight, she sat in the emergency department with her husband, as he was not feeling well as he is diagnosed with kidney cancer.  She states she did not take her medication this morning as she was not able to go home to take her medications.  Blood pressure this morning was 150/77 initially, and came down to 136/89.  Will continue with current management as patient did not take her medication this morning.  Plan: -Continue lisinopril-HCTZ 20-12.5 mg daily -Follow-up BMP -Continue to monitor blood pressure

## 2022-12-30 NOTE — Progress Notes (Signed)
CC: Diabetes follow up   HPI:  [name] is a 67 y.o. female with a past medical history of hypertension, type 2 diabetes mellitus, seizure disorder, hyperlipidemia who presents for follow-up appointment.  Please see assessment and plan for full HPI.  Meds: Hyperlipidemia: Crestor 20 mg daily- No longer taking  Hypertension: Lisinopril-hydrochlorothiazide 20-12.5 mg daily Type 2 diabetes mellitus: Metformin 1000 mg twice daily, Farxiga  Nausea: Promethazine   Past Medical History:  Diagnosis Date   Abdominal pain 02/02/2022   Abdominal pain, chronic, left upper quadrant 09/21/2016   Overview:  Added automatically from request for surgery [id number]   Ankle injury, right, initial encounter 04/08/2020   IOV 04/08/20   Arthralgia 01/19/2018   Beta thalassemia (HCC)    Blood dyscrasia    thalasemia, sickle cell trait   Childhood asthma    Chronic abdominal pain    Unclear etiology, thought to be due to chronic pancreatitis previously in setting of her pancreatic divisum - however, EUS and EGD performed at [facility]  (10/05/2011) showing normal esophageall, gastric, duodenal mucosa. EUS showing no pancreatic masses, cysts, or changes of chronic pancreatitis. Biliary system nondilated and had no endosonographic abnormalities.   Colon cancer screening 05/23/2016   CVA (cerebral vascular accident) [facility]) 1993   Per report by patient she had a stroke and prolonged rehab course; still "weak on left side but not much" (03/29/2017)   Encounter for screening mammogram for breast cancer 12/02/2016   Family history of adverse reaction to anesthesia    "mom took a long while to wake up; she was allergic to it" (03/29/2017)   Grief reaction 02/24/2017   History of blood transfusion 1980s   "related to minor sickle cell crisis" (03/29/2017)   HLD (hyperlipidemia) 2009   Hypertension    Iron deficiency anemia 11/16/2011   Migraine headache    "q couple years now" (03/29/2017)    Otitis media 08/24/2021   Palpitation 07/18/2016   Pancreatic divisum    S/P ERCP with stenting 07/2010 at [facility], then stent removal (08/05/2010)   Pneumonia    "had it in my teens 3 times" (03/29/2017)   Pulmonary embolus (HCC) 08/2004   Two areas of V/Q mismatch. Findings compatible  with high probability for pulmonary embolus.; pt was on coumadin for 1 year   Right calf pain 05/18/2018   Seizures (HCC) 1993   "after the stroke in 1993; none for years now"  (03/29/2017)   Sickle cell trait (HCC)    Thalassemia    Type II diabetes mellitus (HCC) 2010   well controlled   Uterine cancer (HCC)      Current Outpatient Medications:    dapagliflozin propanediol (FARXIGA) 5 MG TABS tablet, Take 1 tablet (5 mg total) by mouth daily before breakfast., Disp: 30 tablet, Rfl: 0   acetaminophen (TYLENOL) 500 MG tablet, Take 2 tablets (1,000 mg total) by mouth every 8 (eight) hours as needed for mild pain or moderate pain., Disp: 30 tablet, Rfl: 0   fluticasone (FLONASE) 50 MCG/ACT nasal spray, Place 2 sprays into both nostrils daily. (Patient not taking: Reported on 02/02/2022), Disp: 9.9 mL, Rfl: 0   lisinopril-hydrochlorothiazide (ZESTORETIC) 20-12.5 MG tablet, TAKE 1 TABLET BY MOUTH DAILY, Disp: 90 tablet, Rfl: 3   metFORMIN (GLUCOPHAGE) 1000 MG tablet, Take 1 tablet (1,000 mg total) by mouth 2 (two) times daily with a meal., Disp: 180 tablet, Rfl: 2   morphine (MS CONTIN) 30 MG 12 hr tablet, Take 30 mg by mouth  2 (two) times daily., Disp: , Rfl:    Multiple Vitamin (MULTIVITAMIN PO), Take 1 tablet by mouth daily., Disp: , Rfl:    promethazine (PHENERGAN) 12.5 MG tablet, TAKE 1 TABLET(12.5 MG) BY MOUTH EVERY 8 HOURS AS NEEDED, Disp: 30 tablet, Rfl: 1  Review of Systems:    Patient endorses no concerns today. Please see assessment and plan for full HPI.  Physical Exam:  Vitals:   12/30/22 0829 12/30/22 0903  BP: (!) 150/77 136/89  Pulse: 79 71  Temp: 98.6 F (37 C)   TempSrc: Oral    SpO2: 100%   Weight: 107 lb 9.6 oz (48.8 kg)    General: Patient is sitting comfortably in the room  Head: Normocephalic, atraumatic  Cardio: Regular rate and rhythm, no murmurs, rubs or gallops Pulmonary: Clear to ausculation bilaterally with no rales, rhonchi, and crackles    Assessment & Plan:   Essential hypertension Patient has past medical history of hypertension.  Her current medication includes lisinopril-hydrochlorothiazide 20-12.5 mg daily.  She reports adherence to this medication.  She states that she has been stressed out recently.  Overnight, she sat in the emergency department with her husband, as he was not feeling well as he is diagnosed with kidney cancer.  She states she did not take her medication this morning as she was not able to go home to take her medications.  Blood pressure this morning was 150/77 initially, and came down to 136/89.  Will continue with current management as patient did not take her medication this morning.  Plan: -Continue lisinopril-HCTZ 20-12.5 mg daily -Follow-up BMP -Continue to monitor blood pressure  Uncontrolled type 2 diabetes mellitus with hyperglycemia (HCC) Patient is here for type 2 diabetes follow-up.  Her A1c today is 7.4, which is the same as 4 months ago.  She states that she was started on Jardiance at previous visit, but due to financial strain, she was not able to afford this medication and never started it.  She states that since her husband has been diagnosed with kidney cancer, all of their financial resources have been geared towards his fight with cancer and therefore she was not able to obtain her Jardiance.  She states adherence with her metformin.  A1c today is 7.4.  She states she is compliant with her medications.  Plan: -Continue metformin 1000 mg twice daily -Start Jardiance to see if patient could be able to afford this, and have patient talk with the pharmacist to see which medications are more affordable for  her -Follow-up in 3 months for A1c -Foot exam at next appointment -Urine microalbumin creatinine ratio pending  Screening for osteoporosis Referred patient for DEXA scan to screen for osteoporosis.  Plan: -Follow-up DEXA scan results  Flu vaccine need Administered flu vaccination today  Patient discussed with Dr. Letta Kocher, DO PGY-2 Internal Medicine Resident  Pager: [phone]

## 2022-12-30 NOTE — Assessment & Plan Note (Signed)
Referred patient for DEXA scan to screen for osteoporosis.  Plan: -Follow-up DEXA scan results

## 2022-12-30 NOTE — Assessment & Plan Note (Signed)
Patient is here for type 2 diabetes follow-up.  Her A1c today is 7.4, which is the same as 4 months ago.  She states that she was started on Jardiance at previous visit, but due to financial strain, she was not able to afford this medication and never started it.  She states that since her husband has been diagnosed with kidney cancer, all of their financial resources have been geared towards his fight with cancer and therefore she was not able to obtain her Jardiance.  She states adherence with her metformin.  A1c today is 7.4.  She states she is compliant with her medications.  Plan: -Continue metformin 1000 mg twice daily -Start Jardiance to see if patient could be able to afford this, and have patient talk with the pharmacist to see which medications are more affordable for her -Follow-up in 3 months for A1c -Foot exam at next appointment -Urine microalbumin creatinine ratio pending

## 2022-12-31 LAB — BMP8+ANION GAP
Anion Gap: 15 mmol/L (ref 10.0–18.0)
BUN/Creatinine Ratio: 20 (ref 12–28)
BUN: 13 mg/dL (ref 8–27)
CO2: 27 mmol/L (ref 20–29)
Calcium: 10.2 mg/dL (ref 8.7–10.3)
Chloride: 100 mmol/L (ref 96–106)
Creatinine, Ser: 0.66 mg/dL (ref 0.57–1.00)
Glucose: 152 mg/dL — ABNORMAL HIGH (ref 70–99)
Potassium: 4.5 mmol/L (ref 3.5–5.2)
Sodium: 142 mmol/L (ref 134–144)
eGFR: 95 mL/min/{1.73_m2} (ref >59)

## 2022-12-31 LAB — LIPID PANEL
Chol/HDL Ratio: 2.9 ratio (ref 0.0–4.4)
Cholesterol, Total: 251 mg/dL — ABNORMAL HIGH (ref 100–199)
HDL: 88 mg/dL (ref >39)
LDL Chol Calc (NIH): 144 mg/dL — ABNORMAL HIGH (ref 0–99)
Triglycerides: 114 mg/dL (ref 0–149)
VLDL Cholesterol Cal: 19 mg/dL (ref 5–40)

## 2023-01-01 LAB — MICROALBUMIN / CREATININE URINE RATIO
Creatinine, Urine: 63.7 mg/dL
Microalb/Creat Ratio: <5 mg/g{creat} (ref 0–29)
Microalbumin, Urine: <3 ug/mL

## 2023-01-03 NOTE — Assessment & Plan Note (Signed)
Elevated cholesterol and LDL. Patient recommended to start a statin and she stated that right now she does not want to start one. She states that she would like to make lifestyle changes. I did recommenced patient that she should take a statin along with lifestyle changes and she stated that she will think about a statin later on but not right now. Patient states that she will call the clinic to let us know when she is ready to start a statin.   Plan: At next visit, discuss patient's willingness to start a statin.

## 2023-01-12 DIAGNOSIS — Z5181 Encounter for therapeutic drug level monitoring: Secondary | ICD-10-CM | POA: Diagnosis not present

## 2023-01-12 DIAGNOSIS — R1012 Left upper quadrant pain: Secondary | ICD-10-CM | POA: Diagnosis not present

## 2023-01-12 DIAGNOSIS — Z79899 Other long term (current) drug therapy: Secondary | ICD-10-CM | POA: Diagnosis not present

## 2023-01-20 ENCOUNTER — Other Ambulatory Visit: Payer: Self-pay | Admitting: Student

## 2023-01-20 DIAGNOSIS — Q453 Other congenital malformations of pancreas and pancreatic duct: Secondary | ICD-10-CM

## 2023-01-20 NOTE — Telephone Encounter (Signed)
Next appt scheduled 12/2 with Dr Ned Card.

## 2023-02-20 ENCOUNTER — Other Ambulatory Visit: Payer: Self-pay | Admitting: Internal Medicine

## 2023-02-20 DIAGNOSIS — Q453 Other congenital malformations of pancreas and pancreatic duct: Secondary | ICD-10-CM

## 2023-03-09 DIAGNOSIS — G894 Chronic pain syndrome: Secondary | ICD-10-CM | POA: Diagnosis not present

## 2023-03-09 DIAGNOSIS — R1012 Left upper quadrant pain: Secondary | ICD-10-CM | POA: Diagnosis not present

## 2023-03-22 ENCOUNTER — Other Ambulatory Visit: Payer: Self-pay | Admitting: Internal Medicine

## 2023-03-22 DIAGNOSIS — Q453 Other congenital malformations of pancreas and pancreatic duct: Secondary | ICD-10-CM

## 2023-04-03 ENCOUNTER — Encounter: Payer: Medicare HMO | Admitting: Internal Medicine

## 2023-04-13 ENCOUNTER — Ambulatory Visit: Payer: Medicare HMO

## 2023-04-13 ENCOUNTER — Ambulatory Visit: Payer: Medicare HMO | Admitting: Student

## 2023-04-13 VITALS — BP 139/73 | HR 102 | Temp 98.3°F | Ht 60.0 in | Wt 108.0 lb

## 2023-04-13 DIAGNOSIS — E1165 Type 2 diabetes mellitus with hyperglycemia: Secondary | ICD-10-CM

## 2023-04-13 DIAGNOSIS — Z0000 Encounter for general adult medical examination without abnormal findings: Secondary | ICD-10-CM | POA: Diagnosis not present

## 2023-04-13 DIAGNOSIS — I10 Essential (primary) hypertension: Secondary | ICD-10-CM

## 2023-04-13 DIAGNOSIS — Q453 Other congenital malformations of pancreas and pancreatic duct: Secondary | ICD-10-CM

## 2023-04-13 DIAGNOSIS — Z7984 Long term (current) use of oral hypoglycemic drugs: Secondary | ICD-10-CM | POA: Diagnosis not present

## 2023-04-13 DIAGNOSIS — E119 Type 2 diabetes mellitus without complications: Secondary | ICD-10-CM

## 2023-04-13 DIAGNOSIS — Z1211 Encounter for screening for malignant neoplasm of colon: Secondary | ICD-10-CM | POA: Insufficient documentation

## 2023-04-13 LAB — POCT GLYCOSYLATED HEMOGLOBIN (HGB A1C): Hemoglobin A1C: 8 % — AB (ref 4.0–5.6)

## 2023-04-13 LAB — GLUCOSE, CAPILLARY: Glucose-Capillary: 154 mg/dL — ABNORMAL HIGH (ref 70–99)

## 2023-04-13 MED ORDER — PROMETHAZINE HCL 12.5 MG PO TABS
ORAL_TABLET | ORAL | 1 refills | Status: DC
Start: 2023-04-13 — End: 2023-05-11

## 2023-04-13 MED ORDER — EMPAGLIFLOZIN 10 MG PO TABS
10.0000 mg | ORAL_TABLET | Freq: Every day | ORAL | 0 refills | Status: DC
Start: 2023-04-13 — End: 2023-07-07

## 2023-04-13 NOTE — Assessment & Plan Note (Addendum)
Patient is a history of hypertension.  Currently being managed with lisinopril-HCTZ combo.  Blood pressure in the office today is 139/73.  Patient reports she has not taken her medication prior to this visit.  She denies any chest pain headache at this time. - Continue lisinopril-HCTZ 20-12.5 mg daily - Encourage to continue DASH diet and lifestyle modification

## 2023-04-13 NOTE — Assessment & Plan Note (Addendum)
History of type 2 diabetes currently being managed with metformin 1000 mg twice daily.  Hemoglobin A1c in the office today is 8.  Patient reports she sometimes forgets to take her metformin.  Patient could benefit from a pillbox.  Will also add Jardiance 10 mg daily at this time since patient's hemoglobin is not at goal.Patient denies any polydipsia, polyuria at this time also denying any changes in vision.  She does not endorse any UTI symptoms.  Medication adherence adequately discussed with the patient in office today. -Continue metformin 1000 mg twice daily -Give a pillbox -Foot exam today

## 2023-04-13 NOTE — Progress Notes (Signed)
Subjective:   [name] is a 67 y.o. female who presents for Medicare Annual (Subsequent) preventive examination.  Visit Complete: In person  Patient Medicare AWV questionnaire was completed by the patient on 04/13/2023; I have confirmed that all information answered by patient is correct and no changes since this date.        Objective:    Today's Vitals   04/13/23 0834  BP: 139/73  Pulse: (!) 102  Temp: 98.3 F (36.8 C)  TempSrc: Oral  SpO2: 97%  Weight: 108 lb (49 kg)  Height: 5' (1.524 m)   Body mass index is 21.09 kg/m.     04/13/2023    8:21 AM 12/15/2022   10:50 AM 05/30/2022    1:18 PM 03/28/2022    8:45 AM 02/25/2022    9:10 AM 02/02/2022    3:59 PM 02/02/2022    8:41 AM  Advanced Directives  Does Patient Have a Medical Advance Directive? No No No No No No No  Would patient like information on creating a medical advance directive? No - Patient declined  No - Patient declined No - Patient declined No - Patient declined No - Patient declined Yes (MAU/Ambulatory/Procedural Areas - Information given)    Current Medications (verified) Outpatient Encounter Medications as of 04/13/2023  Medication Sig   acetaminophen (TYLENOL) 500 MG tablet Take 2 tablets (1,000 mg total) by mouth every 8 (eight) hours as needed for mild pain or moderate pain.   fluticasone (FLONASE) 50 MCG/ACT nasal spray Place 2 sprays into both nostrils daily. (Patient not taking: Reported on 02/02/2022)   lisinopril-hydrochlorothiazide (ZESTORETIC) 20-12.5 MG tablet TAKE 1 TABLET BY MOUTH DAILY   metFORMIN (GLUCOPHAGE) 1000 MG tablet Take 1 tablet (1,000 mg total) by mouth 2 (two) times daily with a meal.   morphine (MS CONTIN) 30 MG 12 hr tablet Take 30 mg by mouth 2 (two) times daily.   Multiple Vitamin (MULTIVITAMIN PO) Take 1 tablet by mouth daily.   promethazine (PHENERGAN) 12.5 MG tablet TAKE 1 TABLET(12.5 MG) BY MOUTH EVERY 8 HOURS AS NEEDED   No facility-administered  encounter medications on file as of 04/13/2023.    Allergies (verified) Butalbital-aspirin-caffeine, Fioricet [butalbital-apap-caffeine], Shellfish-derived products, Shrimp flavor agent (non-screening), Sulfonamide derivatives, Aspirin, and Sulfamethoxazole   History: Past Medical History:  Diagnosis Date   Abdominal pain 02/02/2022   Abdominal pain, chronic, left upper quadrant 09/21/2016   Overview:  Added automatically from request for surgery [id number]   Ankle injury, right, initial encounter 04/08/2020   IOV 04/08/20   Arthralgia 01/19/2018   Beta thalassemia (HCC)    Blood dyscrasia    thalasemia, sickle cell trait   Childhood asthma    Chronic abdominal pain    Unclear etiology, thought to be due to chronic pancreatitis previously in setting of her pancreatic divisum - however, EUS and EGD performed [facility]  (10/05/2011) showing normal esophageall, gastric, duodenal mucosa. EUS showing no pancreatic masses, cysts, or changes of chronic pancreatitis. Biliary system nondilated and had no endosonographic abnormalities.   Colon cancer screening 05/23/2016   CVA (cerebral vascular accident) [place]) 1993   Per report by patient she had a stroke and prolonged rehab course; still "weak on left side but not much" (03/29/2017)   Encounter for screening mammogram for breast cancer 12/02/2016   Family history of adverse reaction to anesthesia    "mom took a long while to wake up; she was allergic to it" (03/29/2017)   Grief reaction 02/24/2017   History  of blood transfusion 1980s   "related to minor sickle cell crisis" (03/29/2017)   HLD (hyperlipidemia) 2009   Hypertension    Iron deficiency anemia 11/16/2011   Migraine headache    "q couple years now" (03/29/2017)   Otitis media 08/24/2021   Palpitation 07/18/2016   Pancreatic divisum    S/P ERCP with stenting 07/2010 at [facility], then stent removal (08/05/2010)   Pneumonia    "had it in my teens 3 times" (03/29/2017)    Pulmonary embolus (HCC) 08/2004   Two areas of V/Q mismatch. Findings compatible  with high probability for pulmonary embolus.; pt was on coumadin for 1 year   Right calf pain 05/18/2018   Seizures (HCC) 1993   "after the stroke in 1993; none for years now"  (03/29/2017)   Sickle cell trait (HCC)    Thalassemia    Type II diabetes mellitus (HCC) 2010   well controlled   Uterine cancer [facility])    Past Surgical History:  Procedure Laterality Date   ABDOMINAL HYSTERECTOMY  1980   2/2 endometriosis   APPENDECTOMY  ?1980   "I think I've had it out" (11/20/2012)   BREAST LUMPECTOMY Bilateral 1980's   "in my milk ducts; both benign" (02/27/2012)   CORONARY ANGIOGRAM Bilateral 03/17/2011   Procedure: CORONARY ANGIOGRAM;  Surgeon: Corky Crafts, MD;  Location: [facility] CATH LAB;  Service: Cardiovascular;  Laterality: Bilateral;   ERCP  07/2010   w/stent placement  at [facility], then stent removal (08/05/2010)   ESOPHAGOGASTRODUODENOSCOPY N/A 10/26/2013   Procedure: ESOPHAGOGASTRODUODENOSCOPY (EGD);  Surgeon: Beverley Fiedler, MD;  Location: [facility] ENDOSCOPY;  Service: Endoscopy;  Laterality: N/A;   Pancreatic stent placement/removal     placed in 2011; removed in 2012/H&P (02/27/2012); "I've had several" (03/29/2017)   SPHINCTEROTOMY     Hattie Perch 11/20/2012   Family History  Problem Relation Age of Onset   Prostate cancer Father    Cancer Father        stomach ca died x 1 year ago   Sickle cell anemia Brother    Lung cancer Maternal Aunt    Lung cancer Maternal Uncle    Breast cancer Maternal Grandmother    Pancreatic cancer Maternal Grandmother    Heart disease Maternal Grandfather    Heart disease Paternal Grandfather    Diabetes Other    Cancer Other        multiple cancers pancreas,colon, breast   Social History   Socioeconomic History   Marital status: Married    Spouse name: Not on file   Number of children: Not on file   Years of education: 12   Highest education level: Not on file   Occupational History    Employer: UNEMPLOYED  Tobacco Use   Smoking status: Former    Current packs/day: 0.00    Average packs/day: 0.5 packs/day for 30.0 years (15.0 ttl pk-yrs)    Types: Cigarettes    Start date: 12/31/1968    Quit date: 01/01/1999    Years since quitting: 24.2   Smokeless tobacco: Never  Vaping Use   Vaping status: Never Used  Substance and Sexual Activity   Alcohol use: No    Alcohol/week: 0.0 standard drinks of alcohol   Drug use: No   Sexual activity: Not Currently    Partners: Male  Other Topics Concern   Not on file  Social History Narrative   Worked at [employer] as a Merchandiser, retail, on Health visitor about 9 hours/ day   Social Drivers of  Health   Financial Resource Strain: High Risk (04/13/2023)   Overall Financial Resource Strain (CARDIA)    Difficulty of Paying Living Expenses: Very hard  Food Insecurity: No Food Insecurity (04/13/2023)   Hunger Vital Sign    Worried About Running Out of Food in the Last Year: Never true    Ran Out of Food in the Last Year: Never true  Transportation Needs: No Transportation Needs (04/13/2023)   PRAPARE - Administrator, Civil Service (Medical): No    Lack of Transportation (Non-Medical): No  Physical Activity: Unknown (04/13/2023)   Exercise Vital Sign    Days of Exercise per Week: 7 days    Minutes of Exercise per Session: Not on file  Stress: No Stress Concern Present (04/13/2023)   [employer] of Occupational Health - Occupational Stress Questionnaire    Feeling of Stress : Not at all  Social Connections: Socially Integrated (04/13/2023)   Social Connection and Isolation Panel [NHANES]    Frequency of Communication with Friends and Family: More than three times a week    Frequency of Social Gatherings with Friends and Family: More than three times a week    Attends Religious Services: More than 4 times per year    Active Member of [employer] or Organizations: Yes    Attends Hospital doctor: More than 4 times per year    Marital Status: Married    Tobacco Counseling Counseling given: Not Answered   Clinical Intake:                        Activities of Daily Living    04/13/2023    8:21 AM 12/30/2022    9:18 AM  In your present state of health, do you have any difficulty performing the following activities:  Hearing? 0 0  Vision? 1 0  Comment cloudy at times   Difficulty concentrating or making decisions? 0 0  Walking or climbing stairs? 0 0  Dressing or bathing? 0 0  Doing errands, shopping? 0 0    Patient Care Team: Chauncey Mann, DO as PCP - General Blair Promise, OD as Consulting Physician (Optometry) [place], Herbert Seta, OD (Optometry)  Indicate any recent Medical Services you may have received from other than [facility] providers in the past year (date may be approximate).     Assessment:   This is a routine wellness examination for [place].  Hearing/Vision screen No results found.   Goals Addressed   None   Depression Screen    04/13/2023    8:23 AM 04/13/2023    8:21 AM 12/30/2022    9:18 AM 08/29/2022    1:18 PM 05/30/2022    1:19 PM 03/28/2022    8:48 AM 02/25/2022    9:10 AM  PHQ 2/9 Scores  PHQ - 2 Score 0 0 0 0 0 0 0  PHQ- 9 Score   0   0 0    Fall Risk    04/13/2023    8:21 AM 12/30/2022    9:18 AM 08/29/2022    1:18 PM 05/30/2022    1:18 PM 03/28/2022    8:44 AM  Fall Risk   Falls in the past year? 0 0 0 0 0  Number falls in past yr: 0 0 0 0 0  Injury with Fall? 0 0 0 0 0  Risk for fall due to : No Fall Risks No Fall Risks No Fall Risks  No Fall Risks  Follow up Falls evaluation completed;Falls prevention discussed Follow up appointment Falls evaluation completed Falls evaluation completed Falls evaluation completed;Falls prevention discussed    MEDICARE RISK AT HOME: Medicare Risk at Home Any stairs in or around the home?: No If so, are there any without handrails?: No Home free of loose throw rugs in  walkways, pet beds, electrical cords, etc?: Yes Adequate lighting in your home to reduce risk of falls?: Yes Life alert?: No Use of a cane, walker or w/c?: No Grab bars in the bathroom?: No Shower chair or bench in shower?: No Elevated toilet seat or a handicapped toilet?: Yes  TIMED UP AND GO:  Was the test performed?  No    Cognitive Function:        04/13/2023    8:35 AM 12/31/2021   10:03 AM  6CIT Screen  What Year? 0 points 0 points  What month? 0 points 0 points  What time? 0 points 0 points  Count back from 20 0 points 0 points  Months in reverse 0 points 0 points  Repeat phrase 0 points 0 points  Total Score 0 points 0 points    Immunizations Immunization History  Administered Date(s) Administered   Fluad Quad(high Dose 65+) 02/02/2022   Fluad Trivalent(High Dose 65+) 12/30/2022   Influenza Split 03/14/2012   Influenza Whole 02/13/2007, 01/21/2009, 03/16/2010   Influenza, High Dose Seasonal PF 03/20/2019   Influenza,inj,Quad PF,6+ Mos 01/16/2013, 01/16/2014, 02/18/2016, 02/24/2017, 01/18/2018   Influenza-Unspecified 03/03/2011   PFIZER(Purple Top)SARS-COV-2 Vaccination 07/01/2019, 07/31/2019   PNEUMOCOCCAL CONJUGATE-20 05/30/2022   Pneumococcal Conjugate-13 04/08/2019   Pneumococcal Polysaccharide-23 02/17/2004, 12/12/2007, 03/03/2011   Tdap 04/07/2011    TDAP status: Due, Education has been provided regarding the importance of this vaccine. Advised may receive this vaccine at local pharmacy or Health Dept. Aware to provide a copy of the vaccination record if obtained from local pharmacy or Health Dept. Verbalized acceptance and understanding.  Flu Vaccine status: Up to date  Pneumococcal vaccine status: Due, Education has been provided regarding the importance of this vaccine. Advised may receive this vaccine at local pharmacy or Health Dept. Aware to provide a copy of the vaccination record if obtained from local pharmacy or Health Dept. Verbalized acceptance  and understanding.  Covid-19 vaccine status: Completed vaccines  Qualifies for Shingles Vaccine? No   Zostavax completed No   Shingrix Completed?: No.    Education has been provided regarding the importance of this vaccine. Patient has been advised to call insurance company to determine out of pocket expense if they have not yet received this vaccine. Advised may also receive vaccine at local pharmacy or Health Dept. Verbalized acceptance and understanding.  Screening Tests Health Maintenance  Topic Date Due   Zoster Vaccines- Shingrix (1 of 2) Never done   DEXA SCAN  Never done   COVID-19 Vaccine (3 - Pfizer risk series) 08/28/2019   DTaP/Tdap/Td (2 - Td or Tdap) 04/06/2021   Colonoscopy  05/02/2021   OPHTHALMOLOGY EXAM  10/16/2021   HEMOGLOBIN A1C  04/01/2023   Diabetic kidney evaluation - eGFR measurement  12/30/2023   Diabetic kidney evaluation - Urine ACR  12/30/2023   LIPID PANEL  12/30/2023   FOOT EXAM  04/12/2024   Medicare Annual Wellness (AWV)  04/12/2024   MAMMOGRAM  05/11/2024   Pneumonia Vaccine 46+ Years old  Completed   INFLUENZA VACCINE  Completed   Hepatitis C Screening  Completed   HPV VACCINES  Aged Out    Health Maintenance  Health  Maintenance Due  Topic Date Due   Zoster Vaccines- Shingrix (1 of 2) Never done   DEXA SCAN  Never done   COVID-19 Vaccine (3 - Pfizer risk series) 08/28/2019   DTaP/Tdap/Td (2 - Td or Tdap) 04/06/2021   Colonoscopy  05/02/2021   OPHTHALMOLOGY EXAM  10/16/2021   HEMOGLOBIN A1C  04/01/2023    Colorectal cancer screening: Type of screening: Colonoscopy. Completed 05/03/2011. Repeat every 10 years  Mammogram status: Completed 05/11/2022. Repeat every year  Lung Cancer Screening: (Low Dose CT Chest recommended if Age 63-80 years, 20 pack-year currently smoking OR have quit w/in 15years.) does not qualify.   Lung Cancer Screening Referral: N/A  Additional Screening:  Hepatitis C Screening: does not qualify; Completed  06/14/2018  Vision Screening: Recommended annual ophthalmology exams for early detection of glaucoma and other disorders of the eye. Is the patient up to date with their annual eye exam?  No  Who is the provider or what is the name of the office in which the patient attends annual eye exams? [facility] If pt is not established with a provider, would they like to be referred to a provider to establish care? No .   Dental Screening: Recommended annual dental exams for proper oral hygiene  Diabetic Foot Exam: Diabetic Foot Exam: Completed 04/13/2023  Community Resource Referral / Chronic Care Management: CRR required this visit?  No   CCM required this visit?  No     Plan:     I have personally reviewed and noted the following in the patient's chart:   Medical and social history Use of alcohol, tobacco or illicit drugs  Current medications and supplements including opioid prescriptions. Patient is not currently taking opioid prescriptions. Functional ability and status Nutritional status Physical activity Advanced directives List of other physicians Hospitalizations, surgeries, and ER visits in previous 12 months Vitals Screenings to include cognitive, depression, and falls Referrals and appointments  In addition, I have reviewed and discussed with patient certain preventive protocols, quality metrics, and best practice recommendations. A written personalized care plan for preventive services as well as general preventive health recommendations were provided to patient.     Cala Bradford, CMA   04/13/2023   After Visit Summary: (In Person-Declined) Patient declined AVS at this time.  Nurse Notes: Face-To-Face Visit  [name] , Thank you for taking time to come for your Medicare Wellness Visit. I appreciate your ongoing commitment to your health goals. Please review the following plan we discussed and let me know if I can assist you in the future.   These are  the goals we discussed:  Goals      Blood Pressure < 140/90     HEMOGLOBIN A1C < 7.0     HEMOGLOBIN A1C < 7.0     LDL CALC < 100        This is a list of the screening recommended for you and due dates:  Health Maintenance  Topic Date Due   Zoster (Shingles) Vaccine (1 of 2) Never done   DEXA scan (bone density measurement)  Never done   COVID-19 Vaccine (3 - Pfizer risk series) 08/28/2019   DTaP/Tdap/Td vaccine (2 - Td or Tdap) 04/06/2021   Colon Cancer Screening  05/02/2021   Eye exam for diabetics  10/16/2021   Hemoglobin A1C  04/01/2023   Yearly kidney function blood test for diabetes  12/30/2023   Yearly kidney health urinalysis for diabetes  12/30/2023   Lipid (cholesterol) test  12/30/2023   Complete foot exam   04/12/2024   Medicare Annual Wellness Visit  04/12/2024   Mammogram  05/11/2024   Pneumonia Vaccine  Completed   Flu Shot  Completed   Hepatitis C Screening  Completed   HPV Vaccine  Aged Out

## 2023-04-13 NOTE — Assessment & Plan Note (Signed)
This is a 67 year old female who had a colon cancer screening in 2013.  Due for follow-up in 2023 but has not gotten that yet.  Discussed the need for a follow-up colonoscopy in the office today, patient is not amenable to having the colonoscopy done at this time.  Will revisit topic at next visit.

## 2023-04-13 NOTE — Progress Notes (Signed)
CC: Annual wellness visit  HPI:  [name] is a 67 y.o. female living with a history stated below and presents today for annual wellness visit and also discuss other chronic conditions. Please see problem based assessment and plan for additional details.  Past Medical History:  Diagnosis Date   Abdominal pain 02/02/2022   Abdominal pain, chronic, left upper quadrant 09/21/2016   Overview:  Added automatically from request for surgery [id number]   Ankle injury, right, initial encounter 04/08/2020   IOV 04/08/20   Arthralgia 01/19/2018   Beta thalassemia (HCC)    Blood dyscrasia    thalasemia, sickle cell trait   Childhood asthma    Chronic abdominal pain    Unclear etiology, thought to be due to chronic pancreatitis previously in setting of her pancreatic divisum - however, EUS and EGD performed at [facility]  (10/05/2011) showing normal esophageall, gastric, duodenal mucosa. EUS showing no pancreatic masses, cysts, or changes of chronic pancreatitis. Biliary system nondilated and had no endosonographic abnormalities.   Colon cancer screening 05/23/2016   CVA (cerebral vascular accident) [facility]) 1993   Per report by patient she had a stroke and prolonged rehab course; still "weak on left side but not much" (03/29/2017)   Encounter for screening mammogram for breast cancer 12/02/2016   Family history of adverse reaction to anesthesia    "mom took a long while to wake up; she was allergic to it" (03/29/2017)   Grief reaction 02/24/2017   History of blood transfusion 1980s   "related to minor sickle cell crisis" (03/29/2017)   HLD (hyperlipidemia) 2009   Hypertension    Iron deficiency anemia 11/16/2011   Migraine headache    "q couple years now" (03/29/2017)   Otitis media 08/24/2021   Palpitation 07/18/2016   Pancreatic divisum    S/P ERCP with stenting 07/2010 at [facility], then stent removal (08/05/2010)   Pneumonia    "had it in my teens 3 times" (03/29/2017)    Pulmonary embolus (HCC) 08/2004   Two areas of V/Q mismatch. Findings compatible  with high probability for pulmonary embolus.; pt was on coumadin for 1 year   Right calf pain 05/18/2018   Seizures (HCC) 1993   "after the stroke in 1993; none for years now"  (03/29/2017)   Sickle cell trait (HCC)    Thalassemia    Type II diabetes mellitus (HCC) 2010   well controlled   Uterine cancer (HCC)     Current Outpatient Medications on File Prior to Visit  Medication Sig Dispense Refill   acetaminophen (TYLENOL) 500 MG tablet Take 2 tablets (1,000 mg total) by mouth every 8 (eight) hours as needed for mild pain or moderate pain. 30 tablet 0   fluticasone (FLONASE) 50 MCG/ACT nasal spray Place 2 sprays into both nostrils daily. (Patient not taking: Reported on 02/02/2022) 9.9 mL 0   lisinopril-hydrochlorothiazide (ZESTORETIC) 20-12.5 MG tablet TAKE 1 TABLET BY MOUTH DAILY 90 tablet 3   metFORMIN (GLUCOPHAGE) 1000 MG tablet Take 1 tablet (1,000 mg total) by mouth 2 (two) times daily with a meal. 180 tablet 2   morphine (MS CONTIN) 30 MG 12 hr tablet Take 30 mg by mouth 2 (two) times daily.     Multiple Vitamin (MULTIVITAMIN PO) Take 1 tablet by mouth daily.     promethazine (PHENERGAN) 12.5 MG tablet TAKE 1 TABLET(12.5 MG) BY MOUTH EVERY 8 HOURS AS NEEDED 30 tablet 1   No current facility-administered medications on file prior to visit.  Family History  Problem Relation Age of Onset   Prostate cancer Father    Cancer Father        stomach ca died x 1 year ago   Sickle cell anemia Brother    Lung cancer Maternal Aunt    Lung cancer Maternal Uncle    Breast cancer Maternal Grandmother    Pancreatic cancer Maternal Grandmother    Heart disease Maternal Grandfather    Heart disease Paternal Grandfather    Diabetes Other    Cancer Other        multiple cancers pancreas,colon, breast    Social History   Socioeconomic History   Marital status: Married    Spouse name: Not on file    Number of children: Not on file   Years of education: 12   Highest education level: Not on file  Occupational History    Employer: UNEMPLOYED  Tobacco Use   Smoking status: Former    Current packs/day: 0.00    Average packs/day: 0.5 packs/day for 30.0 years (15.0 ttl pk-yrs)    Types: Cigarettes    Start date: 12/31/1968    Quit date: 01/01/1999    Years since quitting: 24.2   Smokeless tobacco: Never  Vaping Use   Vaping status: Never Used  Substance and Sexual Activity   Alcohol use: No    Alcohol/week: 0.0 standard drinks of alcohol   Drug use: No   Sexual activity: Not Currently    Partners: Male  Other Topics Concern   Not on file  Social History Narrative   Worked at [employer] as a Merchandiser, retail, on Health visitor about 9 hours/ day   Social Drivers of Health   Financial Resource Strain: High Risk (04/13/2023)   Overall Financial Resource Strain (CARDIA)    Difficulty of Paying Living Expenses: Very hard  Food Insecurity: No Food Insecurity (04/13/2023)   Hunger Vital Sign    Worried About Running Out of Food in the Last Year: Never true    Ran Out of Food in the Last Year: Never true  Transportation Needs: No Transportation Needs (04/13/2023)   PRAPARE - Administrator, Civil Service (Medical): No    Lack of Transportation (Non-Medical): No  Physical Activity: Unknown (04/13/2023)   Exercise Vital Sign    Days of Exercise per Week: 7 days    Minutes of Exercise per Session: Not on file  Stress: No Stress Concern Present (04/13/2023)   [employer] of Occupational Health - Occupational Stress Questionnaire    Feeling of Stress : Not at all  Social Connections: Socially Integrated (04/13/2023)   Social Connection and Isolation Panel [NHANES]    Frequency of Communication with Friends and Family: More than three times a week    Frequency of Social Gatherings with Friends and Family: More than three times a week    Attends Religious Services: More than 4  times per year    Active Member of [employer] or Organizations: Yes    Attends Engineer, structural: More than 4 times per year    Marital Status: Married  Catering manager Violence: Not At Risk (04/13/2023)   Humiliation, Afraid, Rape, and Kick questionnaire    Fear of Current or Ex-Partner: No    Emotionally Abused: No    Physically Abused: No    Sexually Abused: No    Review of Systems: ROS negative except for what is noted on the assessment and plan.  Vitals:   04/13/23 0818  BP:  139/73  Pulse: (!) 102  Temp: 98.3 F (36.8 C)  TempSrc: Oral  SpO2: 97%  Weight: 108 lb (49 kg)  Height: 5' (1.524 m)    Physical Exam: Constitutional: Well-appearing woman, sitting in the chair, in no acute distress. HENT: normocephalic atraumatic, mucous membranes moist Eyes: conjunctiva non-erythematous Cardiovascular: regular rate and rhythm, no m/r/g. great pedal pulses Pulmonary/Chest: normal work of breathing on room air, lungs clear to auscultation bilaterally Abdominal: No masses appreciated , soft nontender , MSK: normal bulk and tone. Neurological: Alert and oriented to time place and person. Skin: warm and dry Psych: normal mood and behavior  Assessment & Plan:   Essential hypertension Patient is a history of hypertension.  Currently being managed with lisinopril-HCTZ combo.  Blood pressure in the office today is 139/73.  Patient reports she has not taken her medication prior to this visit.  She denies any chest pain headache at this time. - Continue lisinopril-HCTZ 20-12.5 mg daily - Encourage to continue DASH diet and lifestyle modification  Uncontrolled type 2 diabetes mellitus with hyperglycemia (HCC) History of type 2 diabetes currently being managed with metformin 1000 mg twice daily.  Hemoglobin A1c in the office today is 8.  Patient reports she sometimes forgets to take her metformin.  Patient could benefit from a pillbox.  Will also add Jardiance 10 mg daily at  this time since patient's hemoglobin is not at goal.Patient denies any polydipsia, polyuria at this time also denying any changes in vision.  She does not endorse any UTI symptoms.  Medication adherence adequately discussed with the patient in office today. -Continue metformin 1000 mg twice daily -Give a pillbox -Foot exam today   Screening for colon cancer This is a 67 year old female who had a colon cancer screening in 2013.  Due for follow-up in 2023 but has not gotten that yet.  Discussed the need for a follow-up colonoscopy in the office today, patient is not amenable to having the colonoscopy done at this time.  Will revisit topic at next visit.  Patient discussed with Dr. Ginger Carne, M.D [facility] Phone: [phone] Date 04/13/2023 Time 9:05 AM

## 2023-04-13 NOTE — Patient Instructions (Signed)
Thank you, [name] for allowing [name] to provide your care today. Today we discussed your general health and your blood sugar.\ -Your hemoglobin A1c went up to 8.  I am adding a medication called Jardiance 10 mg daily.  When you have a pillbox that will help you take your medications as prescribed. -See you again in 3 months to follow-up with your diabetes.   I have ordered the following labs for you:  Lab Orders         POC Hbg A1C       Tests ordered today:    Referrals ordered today:   Referral Orders  No referral(s) requested today     I have ordered the following medication/changed the following medications:   Stop the following medications: There are no discontinued medications.   Start the following medications: Meds ordered this encounter  Medications   empagliflozin (JARDIANCE) 10 MG TABS tablet    Sig: Take 1 tablet (10 mg total) by mouth daily before breakfast.    Dispense:  90 tablet    Refill:  0     Follow up: 3 months   Remember:   Should you have any questions or concerns please call the internal medicine clinic at [phone].    Kathleen Lime, M.D [facility]

## 2023-04-20 ENCOUNTER — Ambulatory Visit: Payer: Medicare HMO | Admitting: Internal Medicine

## 2023-04-20 VITALS — BP 114/71 | HR 86 | Temp 98.6°F | Ht 60.0 in | Wt 106.1 lb

## 2023-04-20 DIAGNOSIS — B9789 Other viral agents as the cause of diseases classified elsewhere: Secondary | ICD-10-CM | POA: Diagnosis not present

## 2023-04-20 DIAGNOSIS — J019 Acute sinusitis, unspecified: Secondary | ICD-10-CM | POA: Diagnosis not present

## 2023-04-20 DIAGNOSIS — E119 Type 2 diabetes mellitus without complications: Secondary | ICD-10-CM | POA: Diagnosis not present

## 2023-04-20 MED ORDER — FLUTICASONE PROPIONATE 50 MCG/ACT NA SUSP: 2.0000 | Freq: Every day | NASAL | 0 refills | Status: AC

## 2023-04-20 NOTE — Patient Instructions (Addendum)
It was a pleasure to care for you today!  I would like you to use flonase nasal spray, 2 sprays in each nostril once per day. You can also alternate between taking tylenol and ibuprofen for your pain.  Please call the clinic if you are not feeling better or start to feel worse in 1 week. If you develop a measured fever (100.4 or higher) please call the clinic or go to urgent care to be evaluated.  I hope you feel better!  My best, Dr. August Saucer

## 2023-04-20 NOTE — Assessment & Plan Note (Signed)
Patient presents with 1 day of "sinus problems." She states that yesterday she had a runny nose which progressed this morning to swelling of the L side of her face, pulsatile intermittent pain behind the L eye, and L face and ear pain. She has not taken anything for her symptoms. The eye pain worsens when she leans over. Denies fever, chills, cough, congestion, sick contacts, vision changes, eye drainage. In the spring she had a similar episode and was diagnosed with a sinus and ear infection and treated with antibiotics.  Assessment/Plan:Given history and exam findings, low suspicion for bacterial source, rather I favor acute viral sinusitis. Will treat conservatively with alternating tylenol/ibuprofen for pain and flonase daily for 1 week. Patient instructed to contact the clinic if she is worse or has no improvement in 1 week, or if she develops a measured fever of 100.4 or greater.

## 2023-04-20 NOTE — Assessment & Plan Note (Signed)
Ophthalmology referral placed.

## 2023-04-20 NOTE — Progress Notes (Signed)
Internal Medicine Clinic Attending  Case discussed with the resident at the time of the visit.  We reviewed the resident's history and exam and pertinent patient test results.  I agree with the assessment, diagnosis, and plan of care documented in the resident's note.  

## 2023-04-20 NOTE — Progress Notes (Signed)
CC: "sinus problems"  HPI:  [name] is a 67 y.o. female with past medical history as detailed below who presents with 1 day of "sinus problems." Please see problem based charting for detailed assessment and plan.  Past Medical History:  Diagnosis Date   Abdominal pain 02/02/2022   Abdominal pain, chronic, left upper quadrant 09/21/2016   Overview:  Added automatically from request for surgery [id number]   Ankle injury, right, initial encounter 04/08/2020   IOV 04/08/20   Arthralgia 01/19/2018   Beta thalassemia (HCC)    Blood dyscrasia    thalasemia, sickle cell trait   Childhood asthma    Chronic abdominal pain    Unclear etiology, thought to be due to chronic pancreatitis previously in setting of her pancreatic divisum - however, EUS and EGD performed at [facility]  (10/05/2011) showing normal esophageall, gastric, duodenal mucosa. EUS showing no pancreatic masses, cysts, or changes of chronic pancreatitis. Biliary system nondilated and had no endosonographic abnormalities.   Colon cancer screening 05/23/2016   CVA (cerebral vascular accident) [facility]) 1993   Per report by patient she had a stroke and prolonged rehab course; still "weak on left side but not much" (03/29/2017)   Encounter for screening mammogram for breast cancer 12/02/2016   Family history of adverse reaction to anesthesia    "mom took a long while to wake up; she was allergic to it" (03/29/2017)   Grief reaction 02/24/2017   History of blood transfusion 1980s   "related to minor sickle cell crisis" (03/29/2017)   HLD (hyperlipidemia) 2009   Hypertension    Iron deficiency anemia 11/16/2011   Migraine headache    "q couple years now" (03/29/2017)   Otitis media 08/24/2021   Palpitation 07/18/2016   Pancreatic divisum    S/P ERCP with stenting 07/2010 at [facility], then stent removal (08/05/2010)   Pneumonia    "had it in my teens 3 times" (03/29/2017)   Pulmonary embolus (HCC) 08/2004   Two  areas of V/Q mismatch. Findings compatible  with high probability for pulmonary embolus.; pt was on coumadin for 1 year   Right calf pain 05/18/2018   Seizures (HCC) 1993   "after the stroke in 1993; none for years now"  (03/29/2017)   Sickle cell trait (HCC)    Thalassemia    Type II diabetes mellitus (HCC) 2010   well controlled   Uterine cancer (HCC)    Review of Systems:  Negative unless otherwise stated.  Physical Exam:  Vitals:   04/20/23 1438  BP: 114/71  Pulse: 86  Temp: 98.6 F (37 C)  TempSrc: Oral  SpO2: 99%  Weight: 106 lb 1.6 oz (48.1 kg)  Height: 5' (1.524 m)   Physical Exam Constitutional:      General: She is not in acute distress.    Appearance: Normal appearance. She is not toxic-appearing.  HENT:     Head: Normocephalic.     Comments: No tenderness to palpation over the L face and scalp.    Right Ear: There is impacted cerumen.     Left Ear: There is impacted cerumen.     Ears:     Comments: Limited internal ear exam 2/2 impacted cerumen.    Nose: Nose normal.  Eyes:     General:        Right eye: No discharge.        Left eye: No discharge.     Extraocular Movements: Extraocular movements intact.     Conjunctiva/sclera: Conjunctivae  normal.     Comments: Visual fields grossly intact.  Cardiovascular:     Rate and Rhythm: Normal rate and regular rhythm.  Pulmonary:     Effort: Pulmonary effort is normal.     Breath sounds: Normal breath sounds.  Skin:    General: Skin is warm and dry.  Neurological:     General: No focal deficit present.     Mental Status: She is alert and oriented to person, place, and time.  Psychiatric:        Mood and Affect: Mood normal.        Behavior: Behavior normal.    Assessment & Plan:   See Encounters Tab for problem based charting.  Controlled type 2 diabetes mellitus without complication, without long-term current use of insulin [facility]) Ophthalmology referral placed.  Acute viral sinusitis Patient  presents with 1 day of "sinus problems." She states that yesterday she had a runny nose which progressed this morning to swelling of the L side of her face, pulsatile intermittent pain behind the L eye, and L face and ear pain. She has not taken anything for her symptoms. The eye pain worsens when she leans over. Denies fever, chills, cough, congestion, sick contacts, vision changes, eye drainage. In the spring she had a similar episode and was diagnosed with a sinus and ear infection and treated with antibiotics.  Assessment/Plan:Given history and exam findings, low suspicion for bacterial source, rather I favor acute viral sinusitis. Will treat conservatively with alternating tylenol/ibuprofen for pain and flonase daily for 1 week. Patient instructed to contact the clinic if she is worse or has no improvement in 1 week, or if she develops a measured fever of 100.4 or greater.  Patient discussed with Dr.  Lafonda Mosses

## 2023-04-21 NOTE — Addendum Note (Signed)
Addended by: Derrek Monaco on: 04/21/2023 09:54 AM   Modules accepted: Level of Service

## 2023-04-21 NOTE — Progress Notes (Signed)
Internal Medicine Clinic Attending  Case discussed with the resident at the time of the visit.  We reviewed the resident's history and exam and pertinent patient test results.  I agree with the assessment, diagnosis, and plan of care documented in the resident's note.  

## 2023-05-11 ENCOUNTER — Other Ambulatory Visit: Payer: Self-pay | Admitting: Student

## 2023-05-11 DIAGNOSIS — G894 Chronic pain syndrome: Secondary | ICD-10-CM | POA: Diagnosis not present

## 2023-05-11 DIAGNOSIS — Z5181 Encounter for therapeutic drug level monitoring: Secondary | ICD-10-CM | POA: Diagnosis not present

## 2023-05-11 DIAGNOSIS — Z79899 Other long term (current) drug therapy: Secondary | ICD-10-CM | POA: Diagnosis not present

## 2023-05-11 DIAGNOSIS — Q453 Other congenital malformations of pancreas and pancreatic duct: Secondary | ICD-10-CM

## 2023-05-11 DIAGNOSIS — R1012 Left upper quadrant pain: Secondary | ICD-10-CM | POA: Diagnosis not present

## 2023-06-08 ENCOUNTER — Other Ambulatory Visit: Payer: Self-pay | Admitting: Internal Medicine

## 2023-06-08 DIAGNOSIS — Q453 Other congenital malformations of pancreas and pancreatic duct: Secondary | ICD-10-CM

## 2023-07-07 ENCOUNTER — Other Ambulatory Visit (HOSPITAL_COMMUNITY): Payer: Self-pay

## 2023-07-07 ENCOUNTER — Ambulatory Visit: Payer: Medicare HMO | Admitting: Internal Medicine

## 2023-07-07 VITALS — BP 136/70 | HR 72 | Temp 98.2°F | Ht 60.0 in | Wt 104.5 lb

## 2023-07-07 DIAGNOSIS — Z9189 Other specified personal risk factors, not elsewhere classified: Secondary | ICD-10-CM

## 2023-07-07 DIAGNOSIS — F1129 Opioid dependence with unspecified opioid-induced disorder: Secondary | ICD-10-CM

## 2023-07-07 DIAGNOSIS — E119 Type 2 diabetes mellitus without complications: Secondary | ICD-10-CM

## 2023-07-07 DIAGNOSIS — Z7984 Long term (current) use of oral hypoglycemic drugs: Secondary | ICD-10-CM | POA: Diagnosis not present

## 2023-07-07 DIAGNOSIS — D561 Beta thalassemia: Secondary | ICD-10-CM | POA: Diagnosis not present

## 2023-07-07 DIAGNOSIS — F1120 Opioid dependence, uncomplicated: Secondary | ICD-10-CM

## 2023-07-07 DIAGNOSIS — I10 Essential (primary) hypertension: Secondary | ICD-10-CM

## 2023-07-07 DIAGNOSIS — Q453 Other congenital malformations of pancreas and pancreatic duct: Secondary | ICD-10-CM

## 2023-07-07 DIAGNOSIS — R5383 Other fatigue: Secondary | ICD-10-CM

## 2023-07-07 DIAGNOSIS — T733XXS Exhaustion due to excessive exertion, sequela: Secondary | ICD-10-CM

## 2023-07-07 LAB — POCT GLYCOSYLATED HEMOGLOBIN (HGB A1C): Hemoglobin A1C: 7.1 % — AB (ref 4.0–5.6)

## 2023-07-07 LAB — GLUCOSE, CAPILLARY: Glucose-Capillary: 125 mg/dL — ABNORMAL HIGH (ref 70–99)

## 2023-07-07 NOTE — Patient Instructions (Addendum)
 Thank you, [name] for allowing [name] to provide your care today. Today we discussed:  Diabetes Continue to take metformin twice a day! Your diabetes is under great control High blood pressure Keep taking lisinopril-hydrochlorothiazide everyday Pain I sent in a new referral to pain management Nausea Continue to take phenergan as needed  I will ask our clinic staff to see if we can get you patient assistance for Creon  Fatigue We are checking your blood counts and iron levels today to see if that is why you are fatigued   I have ordered the following labs for you:  Lab Orders         Glucose, capillary         CBC with Diff         Ferritin         Iron and IBC (CPT-83540,83550)         BMP8+Anion Gap         POC Hbg A1C      Tests ordered today:  EKG  Referrals ordered today:   Referral Orders         Ambulatory referral to Pain Clinic      I have ordered the following medication/changed the following medications:   Stop the following medications: There are no discontinued medications.   Start the following medications: No orders of the defined types were placed in this encounter.    Follow up: 3 months    Should you have any questions or concerns please call the internal medicine clinic at [phone].     Elza Rafter, D.O. [facility]

## 2023-07-07 NOTE — Assessment & Plan Note (Signed)
 On MS contin 30 mg bid as prescribed by pain medicine. New referral sent in, as her old clinic no longer takes her insurance.

## 2023-07-07 NOTE — Assessment & Plan Note (Signed)
 The patient has a history of congenital pancreatic divisum.  She has developed chronic pancreatitis from this and had previously been on Creon therapy, however, she has not been able to afford this in > 1 year.  She takes Phenergan 1-2 times a day on average, with some days off when she does not need it.  EKG checked today and QTc is 414 ms - okay to continue phenergan as needed. Additionally, I have sent a message to [facility] to see if we can get patient assistance for her Creon.

## 2023-07-07 NOTE — Progress Notes (Signed)
 CC: follow up  HPI:  [name] is a 68 y.o. female living with a history stated below and presents today for a follow up of her chronic medical conditions. Please see problem based assessment and plan for additional details.  Past Medical History:  Diagnosis Date   Abdominal pain 02/02/2022   Abdominal pain, chronic, left upper quadrant 09/21/2016   Overview:  Added automatically from request for surgery [id number]   Ankle injury, right, initial encounter 04/08/2020   IOV 04/08/20   Arthralgia 01/19/2018   Beta thalassemia (HCC)    Blood dyscrasia    thalasemia, sickle cell trait   Childhood asthma    Chronic abdominal pain    Unclear etiology, thought to be due to chronic pancreatitis previously in setting of her pancreatic divisum - however, EUS and EGD performed at [facility]  (10/05/2011) showing normal esophageall, gastric, duodenal mucosa. EUS showing no pancreatic masses, cysts, or changes of chronic pancreatitis. Biliary system nondilated and had no endosonographic abnormalities.   Colon cancer screening 05/23/2016   CVA (cerebral vascular accident) [facility]) 1993   Per report by patient she had a stroke and prolonged rehab course; still "weak on left side but not much" (03/29/2017)   Encounter for screening mammogram for breast cancer 12/02/2016   Family history of adverse reaction to anesthesia    "mom took a long while to wake up; she was allergic to it" (03/29/2017)   Grief reaction 02/24/2017   History of blood transfusion 1980s   "related to minor sickle cell crisis" (03/29/2017)   HLD (hyperlipidemia) 2009   Hypertension    Iron deficiency anemia 11/16/2011   Migraine headache    "q couple years now" (03/29/2017)   Otitis media 08/24/2021   Palpitation 07/18/2016   Pancreatic divisum    S/P ERCP with stenting 07/2010 at [facility], then stent removal (08/05/2010)   Pneumonia    "had it in my teens 3 times" (03/29/2017)   Pulmonary embolus (HCC) 08/2004    Two areas of V/Q mismatch. Findings compatible  with high probability for pulmonary embolus.; pt was on coumadin for 1 year   Right calf pain 05/18/2018   Seizures (HCC) 1993   "after the stroke in 1993; none for years now"  (03/29/2017)   Sickle cell trait (HCC)    Thalassemia    Type II diabetes mellitus (HCC) 2010   well controlled   Uterine cancer (HCC)     Current Outpatient Medications on File Prior to Visit  Medication Sig Dispense Refill   acetaminophen (TYLENOL) 500 MG tablet Take 2 tablets (1,000 mg total) by mouth every 8 (eight) hours as needed for mild pain or moderate pain. 30 tablet 0   empagliflozin (JARDIANCE) 10 MG TABS tablet Take 1 tablet (10 mg total) by mouth daily before breakfast. 90 tablet 0   fluticasone (FLONASE) 50 MCG/ACT nasal spray Place 2 sprays into both nostrils daily for 7 days. 9.9 mL 0   lisinopril-hydrochlorothiazide (ZESTORETIC) 20-12.5 MG tablet TAKE 1 TABLET BY MOUTH DAILY 90 tablet 3   metFORMIN (GLUCOPHAGE) 1000 MG tablet Take 1 tablet (1,000 mg total) by mouth 2 (two) times daily with a meal. 180 tablet 2   morphine (MS CONTIN) 30 MG 12 hr tablet Take 30 mg by mouth 2 (two) times daily.     Multiple Vitamin (MULTIVITAMIN PO) Take 1 tablet by mouth daily.     promethazine (PHENERGAN) 12.5 MG tablet TAKE 1 TABLET(12.5 MG) BY MOUTH EVERY 8 HOURS AS NEEDED  30 tablet 1   No current facility-administered medications on file prior to visit.    Family History  Problem Relation Age of Onset   Prostate cancer Father    Cancer Father        stomach ca died x 1 year ago   Sickle cell anemia Brother    Lung cancer Maternal Aunt    Lung cancer Maternal Uncle    Breast cancer Maternal Grandmother    Pancreatic cancer Maternal Grandmother    Heart disease Maternal Grandfather    Heart disease Paternal Grandfather    Diabetes Other    Cancer Other        multiple cancers pancreas,colon, breast    Social History   Socioeconomic History    Marital status: Married    Spouse name: Not on file   Number of children: Not on file   Years of education: 12   Highest education level: Not on file  Occupational History    Employer: UNEMPLOYED  Tobacco Use   Smoking status: Former    Current packs/day: 0.00    Average packs/day: 0.5 packs/day for 30.0 years (15.0 ttl pk-yrs)    Types: Cigarettes    Start date: 12/31/1968    Quit date: 01/01/1999    Years since quitting: 24.5   Smokeless tobacco: Never  Vaping Use   Vaping status: Never Used  Substance and Sexual Activity   Alcohol use: No    Alcohol/week: 0.0 standard drinks of alcohol   Drug use: No   Sexual activity: Not Currently    Partners: Male  Other Topics Concern   Not on file  Social History Narrative   Worked at [employer] as a Merchandiser, retail, on Health visitor about 9 hours/ day   Social Drivers of Health   Financial Resource Strain: High Risk (04/13/2023)   Overall Financial Resource Strain (CARDIA)    Difficulty of Paying Living Expenses: Very hard  Food Insecurity: No Food Insecurity (04/13/2023)   Hunger Vital Sign    Worried About Running Out of Food in the Last Year: Never true    Ran Out of Food in the Last Year: Never true  Transportation Needs: No Transportation Needs (04/13/2023)   PRAPARE - Administrator, Civil Service (Medical): No    Lack of Transportation (Non-Medical): No  Physical Activity: Unknown (04/13/2023)   Exercise Vital Sign    Days of Exercise per Week: 7 days    Minutes of Exercise per Session: Not on file  Stress: No Stress Concern Present (04/13/2023)   [employer] of Occupational Health - Occupational Stress Questionnaire    Feeling of Stress : Not at all  Social Connections: Socially Integrated (04/13/2023)   Social Connection and Isolation Panel [NHANES]    Frequency of Communication with Friends and Family: More than three times a week    Frequency of Social Gatherings with Friends and Family: More than three  times a week    Attends Religious Services: More than 4 times per year    Active Member of [employer] or Organizations: Yes    Attends Banker Meetings: More than 4 times per year    Marital Status: Married  Catering manager Violence: Not At Risk (04/13/2023)   Humiliation, Afraid, Rape, and Kick questionnaire    Fear of Current or Ex-Partner: No    Emotionally Abused: No    Physically Abused: No    Sexually Abused: No    Review of Systems: ROS negative except  for what is noted on the assessment and plan.  Vitals:   07/07/23 1045  BP: 136/70  Pulse: 72  Temp: 98.2 F (36.8 C)  TempSrc: Oral  SpO2: 100%  Weight: 104 lb 8 oz (47.4 kg)  Height: 5' (1.524 m)    Physical Exam: Constitutional: appears well, NAD Cardiovascular: regular rate and rhythm, no m/r/g Pulmonary/Chest: normal work of breathing on room air Abdominal: soft, non-tender, non-distended MSK: normal bulk and tone Skin: warm and dry Psych: normal mood and behavior  Assessment & Plan:    Patient discussed with Dr. Sol Blazing  Essential hypertension Blood pressure is well-controlled on lisinopril-HCTZ 20-12.5 mg daily.  Will check BMP.  Controlled type 2 diabetes mellitus without complication, without long-term current use of insulin (HCC) A1c is improved from 8% to 7.1%.  She takes metformin 1000 mg twice daily and had been recommended to take Jardiance 10 mg daily, however, the Jardiance was too expensive.  Plan: - Continue metformin 1000 mg twice daily  Opioid dependence (HCC) On MS contin 30 mg bid as prescribed by pain medicine. New referral sent in, as her old clinic no longer takes her insurance.   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  She has developed chronic pancreatitis from this and had previously been on Creon therapy, however, she has not been able to afford this in > 1 year.  She takes Phenergan 1-2 times a day on average, with some  days off when she does not need it.  EKG checked today and QTc is 414 ms - okay to continue phenergan as needed. Additionally, I have sent a message to [facility] to see if we can get patient assistance for her Creon.   Fatigue The patient states that for the last few weeks she has noticed that she has been more fatigued than normal.  She sleeps about 8 hours per night, but states that she wakes up and is still exhausted.  She denies any new changes or stressors in her life, but does state that she is the primary caregiver for her husband who has had cancer for about 1 year. She acknowledges that she could be fatigued from this, but is worried since this symptom is new for her over the last few weeks.  PHQ-9 score is a 2 today.  She denies any constipation, cold intolerance, hair thinning, or other signs of hypothyroid.  Of note, she does have a history of beta thalassemia and is at risk for anemia, which could be contributing to her fatigue.  Plan: - CBC  - Iron studies - BMP   Laterrica Libman, D.O. [facility] Internal Medicine, PGY-3 Phone: [phone] Date 07/07/2023 Time 2:30 PM

## 2023-07-07 NOTE — Assessment & Plan Note (Signed)
 A1c is improved from 8% to 7.1%.  She takes metformin 1000 mg twice daily and had been recommended to take Jardiance 10 mg daily, however, the Jardiance was too expensive.  Plan: - Continue metformin 1000 mg twice daily

## 2023-07-07 NOTE — Assessment & Plan Note (Addendum)
 Blood pressure is well-controlled on lisinopril-HCTZ 20-12.5 mg daily.  Will check BMP.

## 2023-07-07 NOTE — Assessment & Plan Note (Addendum)
 The patient states that for the last few weeks she has noticed that she has been more fatigued than normal.  She sleeps about 8 hours per night, but states that she wakes up and is still exhausted.  She denies any new changes or stressors in her life, but does state that she is the primary caregiver for her husband who has had cancer for about 1 year. She acknowledges that she could be fatigued from this, but is worried since this symptom is new for her over the last few weeks.  PHQ-9 score is a 2 today.  She denies any constipation, cold intolerance, hair thinning, or other signs of hypothyroid.  Of note, she does have a history of beta thalassemia and is at risk for anemia, which could be contributing to her fatigue.  Plan: - CBC  - Iron studies - BMP

## 2023-07-08 LAB — CBC WITH DIFFERENTIAL/PLATELET
Basophils Absolute: 0 10*3/uL (ref 0.0–0.2)
Basos: 1 %
EOS (ABSOLUTE): 0.1 10*3/uL (ref 0.0–0.4)
Eos: 1 %
Hematocrit: 34.1 % (ref 34.0–46.6)
Hemoglobin: 11.5 g/dL (ref 11.1–15.9)
Immature Grans (Abs): 0 10*3/uL (ref 0.0–0.1)
Immature Granulocytes: 0 %
Lymphocytes Absolute: 2.3 10*3/uL (ref 0.7–3.1)
Lymphs: 41 %
MCH: 26.6 pg (ref 26.6–33.0)
MCHC: 33.7 g/dL (ref 31.5–35.7)
MCV: 79 fL (ref 79–97)
Monocytes Absolute: 0.5 10*3/uL (ref 0.1–0.9)
Monocytes: 9 %
Neutrophils Absolute: 2.8 10*3/uL (ref 1.4–7.0)
Neutrophils: 48 %
Platelets: 413 10*3/uL (ref 150–450)
RBC: 4.32 x10E6/uL (ref 3.77–5.28)
RDW: 14.3 % (ref 11.7–15.4)
WBC: 5.8 10*3/uL (ref 3.4–10.8)

## 2023-07-08 LAB — BMP8+ANION GAP
Anion Gap: 15 mmol/L (ref 10.0–18.0)
BUN/Creatinine Ratio: 15 (ref 12–28)
BUN: 10 mg/dL (ref 8–27)
CO2: 26 mmol/L (ref 20–29)
Calcium: 10 mg/dL (ref 8.7–10.3)
Chloride: 98 mmol/L (ref 96–106)
Creatinine, Ser: 0.67 mg/dL (ref 0.57–1.00)
Glucose: 116 mg/dL — ABNORMAL HIGH (ref 70–99)
Potassium: 4 mmol/L (ref 3.5–5.2)
Sodium: 139 mmol/L (ref 134–144)
eGFR: 95 mL/min/{1.73_m2} (ref >59)

## 2023-07-08 LAB — IRON AND TIBC
Iron Saturation: 20 % (ref 15–55)
Iron: 69 ug/dL (ref 27–139)
Total Iron Binding Capacity: 340 ug/dL (ref 250–450)
UIBC: 271 ug/dL (ref 118–369)

## 2023-07-08 LAB — FERRITIN: Ferritin: 38 ng/mL (ref 15–150)

## 2023-07-10 ENCOUNTER — Other Ambulatory Visit: Payer: Self-pay | Admitting: Internal Medicine

## 2023-07-10 ENCOUNTER — Other Ambulatory Visit (HOSPITAL_COMMUNITY): Payer: Self-pay

## 2023-07-10 MED ORDER — PANCRELIPASE (LIP-PROT-AMYL) 36000-114000 UNITS PO CPEP: ORAL_CAPSULE | ORAL | 11 refills | Status: AC

## 2023-07-10 NOTE — Progress Notes (Signed)
 Internal Medicine Clinic Attending  Case discussed with the resident at the time of the visit.  We reviewed the resident's history and exam and pertinent patient test results.  I agree with the assessment, diagnosis, and plan of care documented in the resident's note.

## 2023-07-10 NOTE — Progress Notes (Signed)
 Do not relay. Unable to reach patient by phone - BMP, CBC, and iron studies are within normal limits and no primary etiology of the patient's fatigue was identified. Fatigue could be secondary to being the primary caregiver of her husband for the past year, as the patient acknowledged during her visit.

## 2023-07-10 NOTE — Progress Notes (Signed)
 Re-ordered this patient's Creon so we can mail her a patient assistance application.

## 2023-07-12 ENCOUNTER — Encounter: Payer: Self-pay | Admitting: Cardiovascular Disease

## 2023-07-12 NOTE — Telephone Encounter (Signed)
 error

## 2023-07-13 NOTE — Telephone Encounter (Signed)
 error

## 2023-07-14 ENCOUNTER — Telehealth: Payer: Self-pay

## 2023-07-14 NOTE — Telephone Encounter (Signed)
-----   Message from Chauncey Mann sent at 07/10/2023  6:41 PM EDT ----- Regarding: RE: pt assistance Awesome, thank you! It is back on her med list. ----- Message ----- From: Otho Najjar, CPhT Sent: 07/10/2023  10:29 AM EDT To: Chauncey Mann, DO Subject: RE: pt assistance                              Hey!  Test claim shows patients copay is $204 for a month supply. Assistance should be available with [employer] Patient assistance company. I can mail the application to her home. Just let me know once med is added to her med list. ----- Message ----- From: Chauncey Mann, DO Sent: 07/07/2023  11:41 AM EDT To: Otho Najjar, CPhT Subject: pt assistance                                  Hi Nasra Counce!  Could we get patient assistance to get this patient Creon? It fell off her med list, as she has not been able to afford it in >1 yr. Please let me know, thanks.

## 2023-07-18 ENCOUNTER — Other Ambulatory Visit (HOSPITAL_COMMUNITY): Payer: Self-pay

## 2023-07-18 ENCOUNTER — Other Ambulatory Visit: Payer: Self-pay | Admitting: Internal Medicine

## 2023-07-18 DIAGNOSIS — Q453 Other congenital malformations of pancreas and pancreatic duct: Secondary | ICD-10-CM

## 2023-07-18 NOTE — Progress Notes (Addendum)
 Pharmacy Medication Assistance Program Note    10/11/2023  Patient ID: [name], female   DOB: Dec 26, 1955, 68 y.o.   MRN: [id number]     07/14/2023  Outreach Medication One  Manufacturer Medication One [employer]  [employer] Creon   Dose of Creon  36,000  Type of Assistance Manufacturer Assistance  Date Application Sent to Patient 10/11/2023  Application Items Requested Application;Proof of Income  Date Application Sent to Prescriber 07/19/2023     New - mailed to patient home, 2nd attempt 10/11/23.  Provider pages completed, with Aron Lard.

## 2023-08-09 ENCOUNTER — Telehealth: Payer: Self-pay | Admitting: *Deleted

## 2023-08-09 DIAGNOSIS — R1012 Left upper quadrant pain: Secondary | ICD-10-CM | POA: Diagnosis not present

## 2023-08-09 DIAGNOSIS — K861 Other chronic pancreatitis: Secondary | ICD-10-CM | POA: Diagnosis not present

## 2023-08-09 DIAGNOSIS — G894 Chronic pain syndrome: Secondary | ICD-10-CM | POA: Diagnosis not present

## 2023-08-09 NOTE — Telephone Encounter (Signed)
 Copied from CRM [id number]. Topic: General - Other >> Aug 09, 2023 10:00 AM Shamecia H wrote: Reason for CRM: Patient was calling to check the update of having her Pain management changed to somewhere closer to her house, the place she goes to now is out of her network, and she is having to pay out of pocket and she can't afford it, she has to go today and really wants that updated. Patients callback number is [phone].

## 2023-08-09 NOTE — Telephone Encounter (Signed)
 Will forward  to C. Boone.

## 2023-08-29 DIAGNOSIS — E119 Type 2 diabetes mellitus without complications: Secondary | ICD-10-CM | POA: Diagnosis not present

## 2023-08-29 LAB — HM DIABETES EYE EXAM

## 2023-09-12 ENCOUNTER — Other Ambulatory Visit: Payer: Self-pay | Admitting: Student

## 2023-09-12 ENCOUNTER — Other Ambulatory Visit: Payer: Self-pay | Admitting: Internal Medicine

## 2023-09-12 DIAGNOSIS — E1165 Type 2 diabetes mellitus with hyperglycemia: Secondary | ICD-10-CM

## 2023-09-12 NOTE — Telephone Encounter (Signed)
 Medication sent to pharmacy

## 2023-09-27 ENCOUNTER — Other Ambulatory Visit: Payer: Self-pay | Admitting: Internal Medicine

## 2023-09-27 DIAGNOSIS — Q453 Other congenital malformations of pancreas and pancreatic duct: Secondary | ICD-10-CM

## 2023-10-06 ENCOUNTER — Ambulatory Visit (INDEPENDENT_AMBULATORY_CARE_PROVIDER_SITE_OTHER): Admitting: Student

## 2023-10-06 ENCOUNTER — Other Ambulatory Visit: Payer: Self-pay

## 2023-10-06 VITALS — BP 138/79 | HR 66 | Temp 98.2°F | Resp 28 | Ht 60.0 in | Wt 105.0 lb

## 2023-10-06 DIAGNOSIS — T733XXS Exhaustion due to excessive exertion, sequela: Secondary | ICD-10-CM

## 2023-10-06 DIAGNOSIS — R5383 Other fatigue: Secondary | ICD-10-CM | POA: Diagnosis not present

## 2023-10-06 DIAGNOSIS — E118 Type 2 diabetes mellitus with unspecified complications: Secondary | ICD-10-CM

## 2023-10-06 DIAGNOSIS — G894 Chronic pain syndrome: Secondary | ICD-10-CM | POA: Diagnosis not present

## 2023-10-06 DIAGNOSIS — Q453 Other congenital malformations of pancreas and pancreatic duct: Secondary | ICD-10-CM

## 2023-10-06 DIAGNOSIS — Z7984 Long term (current) use of oral hypoglycemic drugs: Secondary | ICD-10-CM | POA: Diagnosis not present

## 2023-10-06 DIAGNOSIS — E119 Type 2 diabetes mellitus without complications: Secondary | ICD-10-CM | POA: Diagnosis not present

## 2023-10-06 DIAGNOSIS — I10 Essential (primary) hypertension: Secondary | ICD-10-CM | POA: Diagnosis not present

## 2023-10-06 LAB — POCT GLYCOSYLATED HEMOGLOBIN (HGB A1C): Hemoglobin A1C: 7.5 % — AB (ref 4.0–5.6)

## 2023-10-06 LAB — GLUCOSE, CAPILLARY: Glucose-Capillary: 135 mg/dL — ABNORMAL HIGH (ref 70–99)

## 2023-10-06 NOTE — Assessment & Plan Note (Addendum)
 No significant improvement in her fatigue. She is still getting about 8 hours of sleep per night and denies any fevers or night sweats. She also denies constipation, cold intolerance, or other symptoms of hypothyroidism. She has a history of beta thalassemia; however, I doubt this is contributing, as her hemoglobin has been normal. Her PHQ-9 score is 2 today. I suspect that the patient's fatigue may also be secondary to her chronic pain syndrome. I will send a referral to GI for a colonoscopy to rule out malignancy. - GI referral placed.

## 2023-10-06 NOTE — Patient Instructions (Addendum)
 It was a pleasure taking care of you today!    For diabetes, please continue metformin  1000 mg twice daily.  2.  We have sent a new referral to be seen by pain specialist.  3.  For hypertension, continue with HCTZ-lisinopril .  4.  I have sent a referral for colonoscopy.   I have ordered the following labs for you:   Lab Orders         Glucose, capillary         POC Hbg A1C       Follow up: 3 months   Should you have any questions or concerns please call the internal medicine clinic at [phone].    Marni Sins, MD  [facility]

## 2023-10-06 NOTE — Progress Notes (Signed)
 CC: 15-month follow-up for diabetes.  HPI:  [name] is a 68 y.o. female living with a history stated below and presents today for follow-up on chronic medical condition..   Please see problem based assessment and plan for additional details. Past Medical History:  Diagnosis Date   Abdominal pain 02/02/2022   Abdominal pain, chronic, left upper quadrant 09/21/2016   Overview:  Added automatically from request for surgery [id number]   Ankle injury, right, initial encounter 04/08/2020   IOV 04/08/20   Arthralgia 01/19/2018   Beta thalassemia (HCC)    Blood dyscrasia    thalasemia, sickle cell trait   Childhood asthma    Chronic abdominal pain    Unclear etiology, thought to be due to chronic pancreatitis previously in setting of her pancreatic divisum - however, [facility] and [facility] performed [facility]  (10/05/2011) showing normal esophageall, gastric, duodenal mucosa. EUS showing no pancreatic masses, cysts, or changes of chronic pancreatitis. Biliary system nondilated and had no endosonographic abnormalities.   Colon cancer screening 05/23/2016   CVA (cerebral vascular accident) [facility]) 1993   Per report by patient she had a stroke and prolonged rehab course; still "weak on left side but not much" (03/29/2017)   Encounter for screening mammogram for breast cancer 12/02/2016   Family history of adverse reaction to anesthesia    "mom took a long while to wake up; she was allergic to it" (03/29/2017)   Grief reaction 02/24/2017   History of blood transfusion 1980s   "related to minor sickle cell crisis" (03/29/2017)   HLD (hyperlipidemia) 2009   Hypertension    Iron deficiency anemia 11/16/2011   Migraine headache    "q couple years now" (03/29/2017)   Otitis media 08/24/2021   Palpitation 07/18/2016   Pancreatic divisum    S/P ERCP with stenting 07/2010 at [facility], then stent removal (08/05/2010)   Pneumonia    "had it in my teens 3 times" (03/29/2017)   Pulmonary  embolus (HCC) 08/2004   Two areas of V/Q mismatch. Findings compatible  with high probability for pulmonary embolus.; pt was on coumadin for 1 year   Right calf pain 05/18/2018   Seizures (HCC) 1993   "after the stroke in 1993; none for years now"  (03/29/2017)   Sickle cell trait (HCC)    Thalassemia    Type II diabetes mellitus (HCC) 2010   well controlled   Uterine cancer (HCC)     Current Outpatient Medications on File Prior to Visit  Medication Sig Dispense Refill   acetaminophen  (TYLENOL ) 500 MG tablet Take 2 tablets (1,000 mg total) by mouth every 8 (eight) hours as needed for mild pain or moderate pain. 30 tablet 0   fluticasone  (FLONASE ) 50 MCG/ACT nasal spray Place 2 sprays into both nostrils daily for 7 days. 9.9 mL 0   lipase/protease/amylase (CREON ) 36000 UNITS CPEP capsule Take 2 capsules (72,000 Units total) by mouth 3 (three) times daily with meals. May also take 1 capsule (36,000 Units total) as needed (with snacks - up to 4 snacks daily). 300 capsule 11   lisinopril -hydrochlorothiazide  (ZESTORETIC ) 20-12.5 MG tablet TAKE 1 TABLET BY MOUTH DAILY 90 tablet 3   metFORMIN  (GLUCOPHAGE ) 1000 MG tablet TAKE 1 TABLET(1000 MG) BY MOUTH TWICE DAILY WITH A MEAL 180 tablet 2   morphine  (MS CONTIN ) 30 MG 12 hr tablet Take 30 mg by mouth 2 (two) times daily.     Multiple Vitamin (MULTIVITAMIN PO) Take 1 tablet by mouth daily.  promethazine  (PHENERGAN ) 12.5 MG tablet TAKE 1 TABLET(12.5 MG) BY MOUTH EVERY 8 HOURS AS NEEDED 60 tablet 1   No current facility-administered medications on file prior to visit.   Review of Systems: ROS negative except for what is noted on the assessment and plan.  Vitals:   10/06/23 1020  BP: 138/79  Pulse: 66  Resp: (!) 28  Temp: 98.2 F (36.8 C)  TempSrc: Oral  SpO2: 100%  Weight: 105 lb (47.6 kg)  Height: 5' (1.524 m)    Last hemoglobin A1c Lab Results  Component Value Date   HGBA1C 7.5 (A) 10/06/2023     BP Readings from Last 3  Encounters:  10/06/23 138/79  07/07/23 136/70  04/20/23 114/71     Physical Exam: Constitutional: NAD Cardiovascular: regular rate and rhythm, no m/r/g Pulmonary/Chest: Clear bilateral lungs.  Normal air movement.   Abdominal: soft, non-tender, non-distended  Assessment & Plan:   Patient discussed with Dr. Ancil Balzarine  Essential hypertension BP well-controlled.  On lisinopril -HCTZ 20-12.5 mg daily.   - Will check BMP.  Controlled type 2 diabetes mellitus without complication, without long-term current use of insulin  (HCC) A1c mildly mildly increased from 7.1 >>7.5.  She takes metformin  1000 mg twice daily.  She was previously on Jardiance , however was discontinued because it was too expensive.  Plan: - Continue metformin  1000 mg twice daily    Congenital anomaly of pancreas The patient has a prior history of chronic pancreatitis and was previously on Creon . However, she stopped taking it about a year ago due to the high co-pay. At her last office visit, the manufacturer's assistance program was attempted, and paperwork was mailed to the patient's home to be completed. Unfortunately, the patient did not receive the paperwork. I have messaged Ms. Camille to see if she can assist with this issue again. The patient continues to take Phenergan  for nausea. Plan: - I will reach out to Ms. Aron Lard, to help with the medication assistance program.    Fatigue No significant improvement in her fatigue. She is still getting about 8 hours of sleep per night and denies any fevers or night sweats. She also denies constipation, cold intolerance, or other symptoms of hypothyroidism. She has a history of beta thalassemia; however, I doubt this is contributing, as her hemoglobin has been normal. Her PHQ-9 score is 2 today. I suspect that the patient's fatigue may also be secondary to her chronic pain syndrome. I will send a referral to GI for a colonoscopy to rule out malignancy. - GI referral  placed.  Chronic pain syndrome. Chronic pain syndrome was previously being managed by [facility], however the practice no longer accepts patient's insurance.Thankfully, Ms. Chilon has been able to find a new provider who accepts patient's insurance.   Marni Sins, MD [facility] Internal Medicine, PGY-1 Pager: [phone] Date 10/06/2023 Time 3:57 PM

## 2023-10-06 NOTE — Assessment & Plan Note (Signed)
 A1c mildly mildly increased from 7.1 >>7.5.  She takes metformin  1000 mg twice daily.  She was previously on Jardiance , however was discontinued because it was too expensive.  Plan: - Continue metformin  1000 mg twice daily

## 2023-10-06 NOTE — Assessment & Plan Note (Signed)
 The patient has a prior history of chronic pancreatitis and was previously on Creon . However, she stopped taking it about a year ago due to the high co-pay. At her last office visit, the manufacturer's assistance program was attempted, and paperwork was mailed to the patient's home to be completed. Unfortunately, the patient did not receive the paperwork. I have messaged [name] to see if she can assist with this issue again. The patient continues to take Phenergan  for nausea. Plan: - I will reach out to [name], to help with the medication assistance program.

## 2023-10-06 NOTE — Assessment & Plan Note (Signed)
 BP well-controlled.  On lisinopril -HCTZ 20-12.5 mg daily.   - Will check BMP.

## 2023-10-08 LAB — BMP8
BUN: 14 mg/dL (ref 8–27)
CO2: 23 mmol/L (ref 20–29)
Calcium: 10.3 mg/dL (ref 8.7–10.3)
Chloride: 99 mmol/L (ref 96–106)
Creatinine, Ser: 0.72 mg/dL (ref 0.57–1.00)
Glucose: 106 mg/dL — ABNORMAL HIGH (ref 70–99)
Potassium: 4.1 mmol/L (ref 3.5–5.2)
Sodium: 138 mmol/L (ref 134–144)
eGFR: 90 mL/min/{1.73_m2} (ref >59)

## 2023-10-09 NOTE — Addendum Note (Signed)
 Addended by: Bevelyn Bryant on: 10/09/2023 10:57 AM   Modules accepted: Level of Service

## 2023-10-09 NOTE — Progress Notes (Signed)
 Internal Medicine Clinic Attending  Case discussed with the resident at the time of the visit.  We reviewed the resident's history and exam and pertinent patient test results.  I agree with the assessment, diagnosis, and plan of care documented in the resident's note.

## 2023-10-10 ENCOUNTER — Ambulatory Visit: Payer: Self-pay | Admitting: Student

## 2023-10-10 NOTE — Progress Notes (Signed)
 Normal electrolytes on BMP.  Patient notified.

## 2023-10-11 ENCOUNTER — Other Ambulatory Visit (HOSPITAL_COMMUNITY): Payer: Self-pay

## 2023-10-13 ENCOUNTER — Other Ambulatory Visit: Payer: Self-pay | Admitting: Internal Medicine

## 2023-10-13 DIAGNOSIS — F1129 Opioid dependence with unspecified opioid-induced disorder: Secondary | ICD-10-CM

## 2023-10-13 NOTE — Progress Notes (Signed)
 New referral to pain management as [facility] states they do not do therapy with pain medication.

## 2023-11-01 ENCOUNTER — Telehealth: Payer: Self-pay | Admitting: *Deleted

## 2023-11-01 NOTE — Telephone Encounter (Signed)
 RTC to [facility] spoke with Representative in Pharmacy.  Call was to see if information was received about a Statin Therapy Alert that was sent to all Diabetic Medicaid patients.  Will forward to [facility], CMA who handles all information faxed to the Clinics.  Copied from CRM [id number] [id number]. Topic: General - Other >> Nov 01, 2023 10:56 AM Miquel SAILOR wrote: Reason for CRM:  [place] from  [facility]   Drug therapy alert fax fax received. Needs call back on update 06/20 and 06/29   [phone]  any pharmacist can speak on this

## 2023-11-02 NOTE — Telephone Encounter (Signed)
 I contacted [facility] pharmacy and [facility] to let him know that I have not received form for [name]. He will re-faxed the form again for the doctor to complete.

## 2023-11-20 NOTE — Telephone Encounter (Signed)
 The was called for her Gastro/Office Referral that was sent per the notes below from [facility]:  Type Date User Summary Attachment  General 11/06/2023 10:10 AM Arlon Mclean Patient was denied TOC in 2018. Patient was advised. -  Note: Patient was denied TOC in 2018. Patient was advised.  . Type Date User Summary Attachment  General 10/27/2023  1:38 PM Arloa Lye D LM on vmail to call back to schedule -  Note: LM on vmail to call back to schedule . Type Date User Summary Attachment  General 10/09/2023  2:59 PM Shirlean Brunetta FALCON [facility] -  Note: [facility] Gastroenterologist in [place], [place]  Located in: Donna MANO [facility] [address] Address: [address], [place], [place] [address] Phone: [phone]     The pt Pain Referral was sent to the following office. Spoke with their office and they will be reaching out to the pt to get her sch.    [facility] Pain management physician in [place], [place]  Address: [name] [place], [place] [address] Phone: [phone]   Copied from CRM [id number]. Topic: Referral - Status >> Nov 20, 2023  8:07 AM Susanna ORN wrote: Reason for CRM: Patient would like for a nurse to give her a call back. She states that she was suppose to have a referral for pain management & she hasn't heard from anyone. Patient states that she is almost out of medication and needs to know what to do. Also states that she was suppose to have a colonoscopy as well and no one has reached out to her. Patient states she spoke with a lady in regards to the colonoscopy and they told her that they couldn't help her. Please give patient a call back. CB #: [phone].

## 2023-11-27 NOTE — Telephone Encounter (Signed)
 Spoke with the patient and gave her the information in Epic to reach out to the following offices in reference to her Referral that were sent.  [facility]   [facility] Pain management physician in [place], [place]  Address: [address] [name], [place] [address] Phone: [phone]   [facility] in [place], [place]  Address: [address] [place], [place] [address] Phone: [phone]  Copied from CRM [id number]. Topic: Referral - Status >> Nov 23, 2023  8:19 AM [name] wrote: Reason for CRM:  she was suppose to have a referral for pain management  and its been a month and no one reached out to the patient ,  and also need a colonoscopy referral and patient need a referral for colonoscopy in [place] ,  when called the number that she was given they stated pt last colonoscopy was done in [place] , and need to called the one in [place] , patient stated the referral in [place] is too far, patient need a colonoscopy referral in [place]  Please give patient a call back. CB #: [phone]. ,when call lf no response leave a voicemail or text message

## 2023-12-01 ENCOUNTER — Other Ambulatory Visit: Payer: Self-pay

## 2023-12-01 DIAGNOSIS — Q453 Other congenital malformations of pancreas and pancreatic duct: Secondary | ICD-10-CM

## 2023-12-02 MED ORDER — PROMETHAZINE HCL 12.5 MG PO TABS
ORAL_TABLET | ORAL | 1 refills | Status: DC
Start: 2023-12-02 — End: 2024-01-24

## 2023-12-11 ENCOUNTER — Ambulatory Visit: Payer: Self-pay

## 2023-12-11 DIAGNOSIS — R1032 Left lower quadrant pain: Secondary | ICD-10-CM | POA: Diagnosis not present

## 2023-12-11 DIAGNOSIS — Z79899 Other long term (current) drug therapy: Secondary | ICD-10-CM | POA: Diagnosis not present

## 2023-12-11 DIAGNOSIS — I10 Essential (primary) hypertension: Secondary | ICD-10-CM | POA: Diagnosis not present

## 2023-12-11 DIAGNOSIS — K861 Other chronic pancreatitis: Secondary | ICD-10-CM | POA: Diagnosis not present

## 2023-12-11 DIAGNOSIS — G894 Chronic pain syndrome: Secondary | ICD-10-CM | POA: Diagnosis not present

## 2023-12-11 NOTE — Telephone Encounter (Signed)
 FYI Only or Action Required?: FYI only for provider.  Patient was last seen in primary care on 10/06/2023 by Celestina Czar, MD.  Called Nurse Triage reporting Pain.  Symptoms began yesterday.  Interventions attempted: OTC medications: tylenol  and tums with minimal relief.  Symptoms are: unchanged.  Triage Disposition: See Physician Within 24 Hours  Patient/caregiver understands and will follow disposition?: Yes     Copied from CRM #[id number]. Topic: Clinical - Red Word Triage >> Dec 11, 2023  4:34 PM Mercer PEDLAR wrote: Red Word that prompted transfer to Nurse Triage: left thigh pain generating to back Reason for Disposition  [1] MODERATE pain (e.g., interferes with normal activities) AND [2] pain comes and goes (cramps) AND [3] present > 24 hours  (Exception: Pain with Vomiting or Diarrhea - see that Guideline.)  Answer Assessment - Initial Assessment Questions 1. LOCATION: Where does it hurt?      Middle abd  2. RADIATION: Does the pain shoot anywhere else? (e.g., chest, back)     back 3. ONSET: When did the pain begin? (e.g., minutes, hours or days ago)      yes 4. SUDDEN: Gradual or sudden onset?     sudden 5. PATTERN Does the pain come and go, or is it constant?     constant 6. SEVERITY: How bad is the pain?  (e.g., Scale 1-10; mild, moderate, or severe)     10/10 - tylenol  without relief 7. RECURRENT SYMPTOM: Have you ever had this type of stomach pain before? If Yes, ask: When was the last time? and What happened that time?      Pt reports chronic pancreatitis hx and having similar sx 8. CAUSE: What do you think is causing the stomach pain? (e.g., gallstones, recent abdominal surgery)     See above 9. RELIEVING/AGGRAVATING FACTORS: What makes it better or worse? (e.g., antacids, bending or twisting motion, bowel movement)     Diet makes it worse 10. OTHER SYMPTOMS: Do you have any other symptoms? (e.g., back pain, diarrhea, fever, urination pain,  vomiting)       Nausea 11. PREGNANCY: Is there any chance you are pregnant? When was your last menstrual period?       N/a  Answer Assessment - Initial Assessment Questions 1. ONSET: When did the pain start?      yesterday Reports went to new pain clinic and advised to f/U with PCP 2. LOCATION: Where is the pain located?      L thigh pain that radiates to back 3. PAIN: How bad is the pain?    (Scale 1-10; or mild, moderate, severe)     *No Answer* 4. WORK OR EXERCISE: Has there been any recent work or exercise that involved this part of the body?      *No Answer* 5. CAUSE: What do you think is causing the leg pain?     Endorses hx of pancreatitis 6. OTHER SYMPTOMS: Do you have any other symptoms? (e.g., chest pain, back pain, breathing difficulty, swelling, rash, fever, numbness, weakness)     *No Answer* 7. PREGNANCY: Is there any chance you are pregnant? When was your last menstrual period?     *No Answer*  Protocols used: Leg Pain-A-AH, Abdominal Pain - Female-A-AH

## 2023-12-11 NOTE — Telephone Encounter (Signed)
 Pt has an appt tomorrow 8/12 with Dr Edgardo.

## 2023-12-12 ENCOUNTER — Ambulatory Visit (INDEPENDENT_AMBULATORY_CARE_PROVIDER_SITE_OTHER): Payer: Self-pay

## 2023-12-12 VITALS — BP 148/92 | HR 63 | Wt 100.8 lb

## 2023-12-12 DIAGNOSIS — G8929 Other chronic pain: Secondary | ICD-10-CM

## 2023-12-12 DIAGNOSIS — R109 Unspecified abdominal pain: Secondary | ICD-10-CM | POA: Diagnosis not present

## 2023-12-12 DIAGNOSIS — F1120 Opioid dependence, uncomplicated: Secondary | ICD-10-CM

## 2023-12-12 DIAGNOSIS — I10 Essential (primary) hypertension: Secondary | ICD-10-CM

## 2023-12-12 MED ORDER — MORPHINE SULFATE ER 30 MG PO TBCR: 30.0000 mg | EXTENDED_RELEASE_TABLET | Freq: Two times a day (BID) | ORAL | 0 refills | Status: AC

## 2023-12-12 NOTE — Assessment & Plan Note (Signed)
 Patient's blood pressure today was 148/92.  It is likely elevated due to her pain.  Will recheck during her next visit.

## 2023-12-12 NOTE — Progress Notes (Deleted)
 CC: Follow-up  HPI:  [name] is a 68 y.o. female living with a history stated below and presents today for follow-up. Please see problem based assessment and plan for additional details.  Past Medical History:  Diagnosis Date   Abdominal pain 02/02/2022   Abdominal pain, chronic, left upper quadrant 09/21/2016   Overview:  Added automatically from request for surgery [id number]   Ankle injury, right, initial encounter 04/08/2020   IOV 04/08/20   Arthralgia 01/19/2018   Beta thalassemia (HCC)    Blood dyscrasia    thalasemia, sickle cell trait   Childhood asthma    Chronic abdominal pain    Unclear etiology, thought to be due to chronic pancreatitis previously in setting of her pancreatic divisum - however, EUS and EGD performed at [facility] [place]  (10/05/2011) showing normal esophageall, gastric, duodenal mucosa. EUS showing no pancreatic masses, cysts, or changes of chronic pancreatitis. Biliary system nondilated and had no endosonographic abnormalities.   Colon cancer screening 05/23/2016   CVA (cerebral vascular accident) [facility]) 1993   Per report by patient she had a stroke and prolonged rehab course; still weak on left side but not much (03/29/2017)   Encounter for screening mammogram for breast cancer 12/02/2016   Family history of adverse reaction to anesthesia    mom took a long while to wake up; she was allergic to it (03/29/2017)   Grief reaction 02/24/2017   History of blood transfusion 1980s   related to minor sickle cell crisis (03/29/2017)   HLD (hyperlipidemia) 2009   Hypertension    Iron deficiency anemia 11/16/2011   Migraine headache    q couple years now (03/29/2017)   Otitis media 08/24/2021   Palpitation 07/18/2016   Pancreatic divisum    S/P ERCP with stenting 07/2010 at [facility], then stent removal (08/05/2010)   Pneumonia    had it in my teens 3 times (03/29/2017)   Pulmonary embolus (HCC) 08/2004   Two areas of V/Q mismatch.  Findings compatible  with high probability for pulmonary embolus.; pt was on coumadin for 1 year   Right calf pain 05/18/2018   Seizures (HCC) 1993   after the stroke in 1993; none for years now  (03/29/2017)   Sickle cell trait (HCC)    Thalassemia    Type II diabetes mellitus (HCC) 2010   well controlled   Uterine cancer (HCC)     Current Outpatient Medications on File Prior to Visit  Medication Sig Dispense Refill   acetaminophen  (TYLENOL ) 500 MG tablet Take 2 tablets (1,000 mg total) by mouth every 8 (eight) hours as needed for mild pain or moderate pain. 30 tablet 0   fluticasone  (FLONASE ) 50 MCG/ACT nasal spray Place 2 sprays into both nostrils daily for 7 days. 9.9 mL 0   lipase/protease/amylase (CREON ) 36000 UNITS CPEP capsule Take 2 capsules (72,000 Units total) by mouth 3 (three) times daily with meals. May also take 1 capsule (36,000 Units total) as needed (with snacks - up to 4 snacks daily). 300 capsule 11   lisinopril -hydrochlorothiazide  (ZESTORETIC ) 20-12.5 MG tablet TAKE 1 TABLET BY MOUTH DAILY 90 tablet 3   metFORMIN  (GLUCOPHAGE ) 1000 MG tablet TAKE 1 TABLET(1000 MG) BY MOUTH TWICE DAILY WITH A MEAL 180 tablet 2   morphine  (MS CONTIN ) 30 MG 12 hr tablet Take 30 mg by mouth 2 (two) times daily.     Multiple Vitamin (MULTIVITAMIN PO) Take 1 tablet by mouth daily.     promethazine  (PHENERGAN ) 12.5 MG tablet TAKE 1  TABLET(12.5 MG) BY MOUTH EVERY 8 HOURS AS NEEDED 60 tablet 1   No current facility-administered medications on file prior to visit.    Family History  Problem Relation Age of Onset   Prostate cancer Father    Cancer Father        stomach ca died x 1 year ago   Sickle cell anemia Brother    Lung cancer Maternal Aunt    Lung cancer Maternal Uncle    Breast cancer Maternal Grandmother    Pancreatic cancer Maternal Grandmother    Heart disease Maternal Grandfather    Heart disease Paternal Grandfather    Diabetes Other    Cancer Other        multiple  cancers pancreas,colon, breast    Social History   Socioeconomic History   Marital status: Married    Spouse name: Not on file   Number of children: Not on file   Years of education: 12   Highest education level: Not on file  Occupational History    Employer: UNEMPLOYED  Tobacco Use   Smoking status: Former    Current packs/day: 0.00    Average packs/day: 0.5 packs/day for 30.0 years (15.0 ttl pk-yrs)    Types: Cigarettes    Start date: 12/31/1968    Quit date: 01/01/1999    Years since quitting: 24.9   Smokeless tobacco: Never  Vaping Use   Vaping status: Never Used  Substance and Sexual Activity   Alcohol use: No    Alcohol/week: 0.0 standard drinks of alcohol   Drug use: No   Sexual activity: Not Currently    Partners: Male  Other Topics Concern   Not on file  Social History Narrative   Worked at [employer] as a Merchandiser, retail, on Health visitor about 9 hours/ day   Social Drivers of Health   Financial Resource Strain: High Risk (04/13/2023)   Overall Financial Resource Strain (CARDIA)    Difficulty of Paying Living Expenses: Very hard  Food Insecurity: No Food Insecurity (12/12/2023)   Hunger Vital Sign    Worried About Running Out of Food in the Last Year: Never true    Ran Out of Food in the Last Year: Never true  Transportation Needs: No Transportation Needs (12/12/2023)   PRAPARE - Administrator, Civil Service (Medical): No    Lack of Transportation (Non-Medical): No  Physical Activity: Unknown (04/13/2023)   Exercise Vital Sign    Days of Exercise per Week: 7 days    Minutes of Exercise per Session: Not on file  Stress: No Stress Concern Present (04/13/2023)   [employer] of Occupational Health - Occupational Stress Questionnaire    Feeling of Stress : Not at all  Social Connections: Socially Integrated (04/13/2023)   Social Connection and Isolation Panel    Frequency of Communication with Friends and Family: More than three times a week     Frequency of Social Gatherings with Friends and Family: More than three times a week    Attends Religious Services: More than 4 times per year    Active Member of [employer] or Organizations: Yes    Attends Banker Meetings: More than 4 times per year    Marital Status: Married  Catering manager Violence: Not At Risk (04/13/2023)   Humiliation, Afraid, Rape, and Kick questionnaire    Fear of Current or Ex-Partner: No    Emotionally Abused: No    Physically Abused: No    Sexually Abused: No  Review of Systems: ROS   Vitals:   12/12/23 0903  BP: (!) 148/88  Pulse: 94  SpO2: 97%  Weight: 100 lb 12.8 oz (45.7 kg)    Physical Exam: Physical Exam   Assessment & Plan:     Patient {GC/GE:[id number]::discussed with,seen with} Dr. {WJFZD:[id number]::Tpoopjfd,Z. Hoffman,Mullen,Narendra,Vincent,Guilloud,Lau,Machen}  Assessment & Plan Other chronic pancreatitis (HCC)    No orders of the defined types were placed in this encounter.    Rebecka Pion, D.O. [facility] Internal Medicine, PGY-1 Date 12/12/2023 Time 9:36 AM

## 2023-12-12 NOTE — Patient Instructions (Addendum)
 We saw you for abdominal pain today.  Please follow the instructions as discussed in today's plan: --Take morphine  as prescribed today for 2 weeks until your next appointment with pain clinic on 12/25/2023 --We will follow-up with you during your next appointment with us  on 01/08/2024.

## 2023-12-12 NOTE — Progress Notes (Signed)
 CC: Abdominal pain  HPI:  [name] is a 68 y.o. female living with a history stated below and presents today for acute visit.  Patient has a history of abdominal pain due to chronic pancreatitis.  Her pain began this Sunday and has not been increasing since then.  Patient said that she has been on applesauce and tea since Sunday.  She has also lost 5 lbs since her last visit in June.  She saw Ms. Melissa Bellamy, NP to establish care with her for pain management.  However patient's next appointment with Ms. Daune is on August 25 and she cannot get her pain medication till then.  Patient mentions that she has excruciating pain in her abdomen  would to get any medication for pain at this time even if she does not get her controlled substance.  She denies any fevers, chills, nausea, vomiting.  She endorses some diarrhea.  She takes her Phenergan  for any nausea.  She has also been taking Tums.   Please see problem based assessment and plan for additional details.   Past Medical History:  Diagnosis Date   Abdominal pain 02/02/2022   Abdominal pain, chronic, left upper quadrant 09/21/2016   Overview:  Added automatically from request for surgery [id number]   Ankle injury, right, initial encounter 04/08/2020   IOV 04/08/20   Arthralgia 01/19/2018   Beta thalassemia (HCC)    Blood dyscrasia    thalasemia, sickle cell trait   Childhood asthma    Chronic abdominal pain    Unclear etiology, thought to be due to chronic pancreatitis previously in setting of her pancreatic divisum - however, EUS and EGD performed at [facility] [place]  (10/05/2011) showing normal esophageall, gastric, duodenal mucosa. EUS showing no pancreatic masses, cysts, or changes of chronic pancreatitis. Biliary system nondilated and had no endosonographic abnormalities.   Colon cancer screening 05/23/2016   CVA (cerebral vascular accident) [facility]) 1993   Per report by patient she had a stroke and prolonged rehab course;  still weak on left side but not much (03/29/2017)   Encounter for screening mammogram for breast cancer 12/02/2016   Family history of adverse reaction to anesthesia    mom took a long while to wake up; she was allergic to it (03/29/2017)   Grief reaction 02/24/2017   History of blood transfusion 1980s   related to minor sickle cell crisis (03/29/2017)   HLD (hyperlipidemia) 2009   Hypertension    Iron deficiency anemia 11/16/2011   Migraine headache    q couple years now (03/29/2017)   Otitis media 08/24/2021   Palpitation 07/18/2016   Pancreatic divisum    S/P ERCP with stenting 07/2010 at [facility], then stent removal (08/05/2010)   Pneumonia    had it in my teens 3 times (03/29/2017)   Pulmonary embolus (HCC) 08/2004   Two areas of V/Q mismatch. Findings compatible  with high probability for pulmonary embolus.; pt was on coumadin for 1 year   Right calf pain 05/18/2018   Seizures (HCC) 1993   after the stroke in 1993; none for years now  (03/29/2017)   Sickle cell trait (HCC)    Thalassemia    Type II diabetes mellitus (HCC) 2010   well controlled   Uterine cancer (HCC)     Current Outpatient Medications on File Prior to Visit  Medication Sig Dispense Refill   acetaminophen  (TYLENOL ) 500 MG tablet Take 2 tablets (1,000 mg total) by mouth every 8 (eight) hours as needed for mild pain  or moderate pain. 30 tablet 0   fluticasone  (FLONASE ) 50 MCG/ACT nasal spray Place 2 sprays into both nostrils daily for 7 days. 9.9 mL 0   lipase/protease/amylase (CREON ) 36000 UNITS CPEP capsule Take 2 capsules (72,000 Units total) by mouth 3 (three) times daily with meals. May also take 1 capsule (36,000 Units total) as needed (with snacks - up to 4 snacks daily). 300 capsule 11   lisinopril -hydrochlorothiazide  (ZESTORETIC ) 20-12.5 MG tablet TAKE 1 TABLET BY MOUTH DAILY 90 tablet 3   metFORMIN  (GLUCOPHAGE ) 1000 MG tablet TAKE 1 TABLET(1000 MG) BY MOUTH TWICE DAILY WITH A MEAL 180  tablet 2   morphine  (MS CONTIN ) 30 MG 12 hr tablet Take 30 mg by mouth 2 (two) times daily.     Multiple Vitamin (MULTIVITAMIN PO) Take 1 tablet by mouth daily.     promethazine  (PHENERGAN ) 12.5 MG tablet TAKE 1 TABLET(12.5 MG) BY MOUTH EVERY 8 HOURS AS NEEDED 60 tablet 1   No current facility-administered medications on file prior to visit.    Family History  Problem Relation Age of Onset   Prostate cancer Father    Cancer Father        stomach ca died x 1 year ago   Sickle cell anemia Brother    Lung cancer Maternal Aunt    Lung cancer Maternal Uncle    Breast cancer Maternal Grandmother    Pancreatic cancer Maternal Grandmother    Heart disease Maternal Grandfather    Heart disease Paternal Grandfather    Diabetes Other    Cancer Other        multiple cancers pancreas,colon, breast    Social History   Socioeconomic History   Marital status: Married    Spouse name: Not on file   Number of children: Not on file   Years of education: 12   Highest education level: Not on file  Occupational History    Employer: UNEMPLOYED  Tobacco Use   Smoking status: Former    Current packs/day: 0.00    Average packs/day: 0.5 packs/day for 30.0 years (15.0 ttl pk-yrs)    Types: Cigarettes    Start date: 12/31/1968    Quit date: 01/01/1999    Years since quitting: 24.9   Smokeless tobacco: Never  Vaping Use   Vaping status: Never Used  Substance and Sexual Activity   Alcohol use: No    Alcohol/week: 0.0 standard drinks of alcohol   Drug use: No   Sexual activity: Not Currently    Partners: Male  Other Topics Concern   Not on file  Social History Narrative   Worked at [employer] as a Merchandiser, retail, on Health visitor about 9 hours/ day   Social Drivers of Health   Financial Resource Strain: High Risk (04/13/2023)   Overall Financial Resource Strain (CARDIA)    Difficulty of Paying Living Expenses: Very hard  Food Insecurity: No Food Insecurity (12/12/2023)   Hunger Vital Sign     Worried About Running Out of Food in the Last Year: Never true    Ran Out of Food in the Last Year: Never true  Transportation Needs: No Transportation Needs (12/12/2023)   PRAPARE - Administrator, Civil Service (Medical): No    Lack of Transportation (Non-Medical): No  Physical Activity: Unknown (04/13/2023)   Exercise Vital Sign    Days of Exercise per Week: 7 days    Minutes of Exercise per Session: Not on file  Stress: No Stress Concern Present (04/13/2023)  [employer] of Occupational Health - Occupational Stress Questionnaire    Feeling of Stress : Not at all  Social Connections: Socially Integrated (04/13/2023)   Social Connection and Isolation Panel    Frequency of Communication with Friends and Family: More than three times a week    Frequency of Social Gatherings with Friends and Family: More than three times a week    Attends Religious Services: More than 4 times per year    Active Member of [employer] or Organizations: Yes    Attends Engineer, structural: More than 4 times per year    Marital Status: Married  Catering manager Violence: Not At Risk (04/13/2023)   Humiliation, Afraid, Rape, and Kick questionnaire    Fear of Current or Ex-Partner: No    Emotionally Abused: No    Physically Abused: No    Sexually Abused: No    Review of Systems: Review of Systems  Reason unable to perform ROS: All pertinent review of systems and history, assessment, plan.     Vitals:   12/12/23 0903 12/12/23 0936  BP: (!) 148/88 (!) 148/92  Pulse: 94 63  SpO2: 97%   Weight: 100 lb 12.8 oz (45.7 kg)     Physical Exam: Physical Exam Constitutional:      General: She is in acute distress.  Cardiovascular:     Rate and Rhythm: Normal rate and regular rhythm.  Pulmonary:     Effort: Pulmonary effort is normal.     Breath sounds: Normal breath sounds.  Abdominal:     Comments: Endorsed tenderness in left upper quadrant and epigastric region  Neurological:      Mental Status: She is alert.      Assessment & Plan:     Patient seen with Dr. Shawn  Assessment & Plan Chronic abdominal pain Patient has had chronic abdominal pain due to pancreatitis.  She was getting morphine  from her previous pain management clinic however she had to stop care at the clinic as it was not covered by her insurance.  Patient is trying to establish care with a new pain clinic and saw Melissa Bellamy, NP for this reason.  However, she needs to go through approval to be established as a patient at the pain clinic and her next appointment for that is on December 25, 2023.  On her physical exam, patient looked in acute distress and had tenderness on palpation in the left upper quadrant. Her PDMP records show that she has consistently filled in her morphine  medication and only uses whatever is prescribed to her without asking for more.  We decided to give her 2-week prescription of morphine  for her pain.  We instructed the patient to also add on Tylenol  as needed for pain control. --Prescribed morphine  30 mg 2 times daily for pain for 2 weeks --Take Tylenol  as needed for pain --Follow-up on next appointment 01/08/2024 Uncomplicated opioid dependence (HCC) Patient to follow-up with Ms.Melissa Daune, NP on 12/25/2023 to establish care with her pain management clinic. Essential hypertension Patient's blood pressure today was 148/92.  It is likely elevated due to her pain.  Will recheck during her next visit.   No orders of the defined types were placed in this encounter.    Rebecka Pion, D.O. [facility] Internal Medicine, PGY-1 Date 12/12/2023 Time 9:47 AM

## 2023-12-12 NOTE — Progress Notes (Signed)
 Internal Medicine Clinic Attending  I was physically present during the key portions of the resident provided service and participated in the medical decision making of patient's management care. I reviewed pertinent patient test results.  The assessment, diagnosis, and plan were formulated together and I agree with the documentation in the resident's note.  Chronic pancreatitis c/b chronic pain: pt currently w/ significant epigastric pain, poor PO intake, weight loss in s/o running out of pain medications as she transitions to different pain medicine provider. PDMP reviewed. Will refill morphine  30 mg BID x2w until appt on 8/25 with Pain Med. HTN: elevated BP today likely in setting of pain. Will reassess once pain is better managed.  Jone Dauphin MD

## 2023-12-12 NOTE — Assessment & Plan Note (Signed)
 Patient has had chronic abdominal pain due to pancreatitis.  She was getting morphine  from her previous pain management clinic however she had to stop care at the clinic as it was not covered by her insurance.  Patient is trying to establish care with a new pain clinic and saw Melissa Bellamy, NP for this reason.  However, she needs to go through approval to be established as a patient at the pain clinic and her next appointment for that is on December 25, 2023.  On her physical exam, patient looked in acute distress and had tenderness on palpation in the left upper quadrant. Her PDMP records show that she has consistently filled in her morphine  medication and only uses whatever is prescribed to her without asking for more.  We decided to give her 2-week prescription of morphine  for her pain.  We instructed the patient to also add on Tylenol  as needed for pain control. --Prescribed morphine  30 mg 2 times daily for pain for 2 weeks --Take Tylenol  as needed for pain --Follow-up on next appointment 01/08/2024

## 2023-12-12 NOTE — Assessment & Plan Note (Signed)
 Patient to follow-up with Ms.Melissa Daune, NP on 12/25/2023 to establish care with her pain management clinic.

## 2023-12-26 DIAGNOSIS — K861 Other chronic pancreatitis: Secondary | ICD-10-CM | POA: Diagnosis not present

## 2023-12-26 DIAGNOSIS — Z7189 Other specified counseling: Secondary | ICD-10-CM | POA: Diagnosis not present

## 2023-12-26 DIAGNOSIS — Z79899 Other long term (current) drug therapy: Secondary | ICD-10-CM | POA: Diagnosis not present

## 2023-12-26 DIAGNOSIS — D571 Sickle-cell disease without crisis: Secondary | ICD-10-CM | POA: Diagnosis not present

## 2023-12-26 DIAGNOSIS — G894 Chronic pain syndrome: Secondary | ICD-10-CM | POA: Diagnosis not present

## 2023-12-26 DIAGNOSIS — R1032 Left lower quadrant pain: Secondary | ICD-10-CM | POA: Diagnosis not present

## 2023-12-26 DIAGNOSIS — F17211 Nicotine dependence, cigarettes, in remission: Secondary | ICD-10-CM | POA: Diagnosis not present

## 2023-12-26 DIAGNOSIS — Z79891 Long term (current) use of opiate analgesic: Secondary | ICD-10-CM | POA: Diagnosis not present

## 2024-01-08 ENCOUNTER — Other Ambulatory Visit: Payer: Self-pay

## 2024-01-08 ENCOUNTER — Encounter: Payer: Self-pay | Admitting: Student

## 2024-01-08 ENCOUNTER — Ambulatory Visit: Admitting: Student

## 2024-01-08 VITALS — BP 146/74 | HR 68 | Ht 60.0 in | Wt 103.6 lb

## 2024-01-08 DIAGNOSIS — E119 Type 2 diabetes mellitus without complications: Secondary | ICD-10-CM | POA: Diagnosis not present

## 2024-01-08 DIAGNOSIS — Z7984 Long term (current) use of oral hypoglycemic drugs: Secondary | ICD-10-CM

## 2024-01-08 DIAGNOSIS — E785 Hyperlipidemia, unspecified: Secondary | ICD-10-CM | POA: Diagnosis not present

## 2024-01-08 DIAGNOSIS — Z87891 Personal history of nicotine dependence: Secondary | ICD-10-CM

## 2024-01-08 DIAGNOSIS — M79671 Pain in right foot: Secondary | ICD-10-CM

## 2024-01-08 DIAGNOSIS — Z87828 Personal history of other (healed) physical injury and trauma: Secondary | ICD-10-CM

## 2024-01-08 DIAGNOSIS — E118 Type 2 diabetes mellitus with unspecified complications: Secondary | ICD-10-CM

## 2024-01-08 DIAGNOSIS — Z23 Encounter for immunization: Secondary | ICD-10-CM

## 2024-01-08 DIAGNOSIS — Z13820 Encounter for screening for osteoporosis: Secondary | ICD-10-CM

## 2024-01-08 DIAGNOSIS — I10 Essential (primary) hypertension: Secondary | ICD-10-CM

## 2024-01-08 DIAGNOSIS — Z79899 Other long term (current) drug therapy: Secondary | ICD-10-CM | POA: Diagnosis not present

## 2024-01-08 DIAGNOSIS — M79674 Pain in right toe(s): Secondary | ICD-10-CM | POA: Diagnosis not present

## 2024-01-08 LAB — POCT GLYCOSYLATED HEMOGLOBIN (HGB A1C): HbA1c, POC (controlled diabetic range): 6.7 % (ref 0.0–7.0)

## 2024-01-08 LAB — GLUCOSE, CAPILLARY: Glucose-Capillary: 139 mg/dL — ABNORMAL HIGH (ref 70–99)

## 2024-01-08 MED ORDER — ROSUVASTATIN CALCIUM 20 MG PO TABS
20.0000 mg | ORAL_TABLET | Freq: Every day | ORAL | 3 refills | Status: DC
Start: 2024-01-08 — End: 2024-03-13

## 2024-01-08 NOTE — Assessment & Plan Note (Signed)
  Patient presents with a history of hypertension with a blood pressure today of 146/74. Their hypertension is not controlled on a regimen of lisinopril -hydrochlorothiazide  20-12.5 mg.  Patient reported that she took her medicine immediately prior to her appointment today. Suspect that her blood pressure is not controlled due to her just taking her medicine immediately prior to the appointment. Prior BMP in June, Scr was WNL. Plan: -Continue current regimen of lisinopril -hydrochlorothiazide  20-12.5 mg. -Patient was asked to keep a blood pressure log at home

## 2024-01-08 NOTE — Assessment & Plan Note (Signed)
 Patient presents with a history of T2DM with a prior A1c of 7.5 in June 2025.  Patient's A1c today is 6.7.  They are on a regimen of metformin  1,000mg  BID.   Plan: -Continue regimen of metformin  1,000 mg BID  -A1c today -Urine ACR  ordered

## 2024-01-08 NOTE — Progress Notes (Signed)
 Established Patient Office Visit  Subjective   Patient ID: [name], female    DOB: 09/04/1955  Age: 68 y.o. MRN: [id number]  Chief Complaint  Patient presents with   Follow-up   Hypothyroidism   Diabetes   Hypertension   [name] is a 68 y.o. who presents to the clinic for T2DM, HTN, and concerns of right 5th metatarsal toe pain. Please see problem based assessment and plan for additional details.    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  Constitutional:      General: She is not in acute distress.    Appearance: She is not toxic-appearing.  Cardiovascular:     Rate and Rhythm: Normal rate and regular rhythm.     Heart sounds: No murmur heard. Pulmonary:     Effort: Pulmonary effort is normal. No respiratory distress.     Breath sounds: No rales.  Abdominal:     General: Bowel sounds  are normal.     Palpations: Abdomen is soft.  Musculoskeletal:     Right lower leg: No edema.     Left lower leg: No edema.     Comments: Right 5th metatarsal decreased range of motion due to pain. No erythema, bruising, edema, or increased warmth appreciated on exam.   Skin:    General: Skin is warm and dry.  Neurological:     Mental Status: She is alert.      Results for orders placed or performed in visit on 01/08/24  Glucose, capillary  Result Value Ref Range   Glucose-Capillary 139 (H) 70 - 99 mg/dL  POC Hbg J8R  Result Value Ref Range   Hemoglobin A1C     HbA1c POC (<> result, manual entry)     HbA1c, POC (prediabetic range)     HbA1c, POC (controlled diabetic range) 6.7 0.0 - 7.0 %    Last metabolic panel Lab Results  Component Value Date   GLUCOSE 106 (H) 10/06/2023   NA 138 10/06/2023   K 4.1 10/06/2023   CL 99 10/06/2023   CO2 23 10/06/2023   BUN 14 10/06/2023   CREATININE 0.72 10/06/2023   EGFR 90 10/06/2023   CALCIUM  10.3 10/06/2023   PHOS 2.5 08/31/2016   PROT 6.9 02/03/2022   ALBUMIN 3.4 (  L) 02/03/2022   LABGLOB 2.9 01/26/2021   AGRATIO 1.7 01/26/2021   BILITOT 0.7 02/03/2022   ALKPHOS 49 02/03/2022   AST 24 02/03/2022   ALT 18 02/03/2022   ANIONGAP 9 02/03/2022   Last lipids Lab Results  Component Value Date   CHOL 251 (H) 12/30/2022   HDL 88 12/30/2022   LDLCALC 144 (H) 12/30/2022   TRIG 114 12/30/2022   CHOLHDL 2.9 12/30/2022   Last hemoglobin A1c Lab Results  Component Value Date   HGBA1C 6.7 01/08/2024      The ASCVD Risk score (Arnett DK, et al., 2019) failed to calculate for the following reasons:   Risk score cannot be calculated because patient has a medical history suggesting prior/existing ASCVD    Assessment & Plan:   Problem List Items Addressed This Visit       Cardiovascular and Mediastinum   Essential hypertension (Chronic)    Patient presents with a history of hypertension with a blood pressure today of  146/74. Their hypertension is not controlled on a regimen of lisinopril -hydrochlorothiazide  20-12.5 mg.  Patient reported that she took her medicine immediately prior to her appointment today. Suspect that her blood pressure is not controlled due to her just taking her medicine immediately prior to the appointment. Prior BMP in June, Scr was WNL. Plan: -Continue current regimen of lisinopril -hydrochlorothiazide  20-12.5 mg. -Patient was asked to keep a blood pressure log at home       Relevant Medications   rosuvastatin  (CRESTOR ) 20 MG tablet     Endocrine   Controlled type 2 diabetes mellitus without complication, without long-term current use of insulin  (HCC) (Chronic)   Patient presents with a history of T2DM with a prior A1c of 7.5 in June 2025.  Patient's A1c today is 6.7.  They are on a regimen of metformin  1,000mg  BID.   Plan: -Continue regimen of metformin  1,000 mg BID  -A1c today -Urine ACR  ordered       Relevant Medications   rosuvastatin  (CRESTOR ) 20 MG tablet     Other   Hyperlipidemia (Chronic)   Last LDL in 2024 was 144. With her history of a CVA, LDL goal is <70. Patient is willing to start crestor  20 mg today. Plan: -Start crestor  20 mg -Repeat Lipid profile in 2 months at follow up appointment       Relevant Medications   rosuvastatin  (CRESTOR ) 20 MG tablet   5th metatarsal Toe pain, right   Patient reports fifth metatarsal pain of the right foot that started a few weeks prior after she injured her fifth metatarsal on a bed frame while walking.  Patient stated that the top of the toe felt like it was stoved into the bed frame.  Since then, she continues to have throbbing right fifth metatarsal pain primarily on the dorsal surface.  She reports pain with walking, wearing shoes, and touching the toe.  She has tried Voltaren gel and is on a chronic pain medicine regimen that has not helped with the pain.  On exam, there is decreased range of motion of the fifth right  metatarsal due to pain.  I was unable to appreciate erythema, bruising, increase in warmth, edema of the fifth right metatarsal.  I suspect that there could possibly be a closed fracture of the right fifth metatarsal. Plan: - Obtain x-ray of the right fifth metatarsal -Provided buddy taping of the right fifth metatarsal -Continue supportive therapy mentioned above      Other Visit Diagnoses  Controlled type 2 diabetes mellitus with complication, without long-term current use of insulin  (HCC)    -  Primary   Relevant Medications   rosuvastatin  (CRESTOR ) 20 MG tablet   Other Relevant Orders   POC Hbg A1C (Completed)   Microalbumin / Creatinine Urine Ratio   Glucose, capillary (Completed)     Right foot pain       Relevant Orders   DG Foot Complete Right     Osteoporosis screening       Relevant Orders   HM DEXA SCAN (Completed)       Return in about 2 months (around 03/09/2024) for HTN, HLD.   Patient discussed with Dr. Shawn Damien Lease, DO

## 2024-01-08 NOTE — Patient Instructions (Signed)
 Thank you, [name] for allowing us  to provide your care today. Today we discussed blood pressure, diabetes, and right pinky toe pain.    Please take your blood pressure three times a week and keep a log.   Start Crestor  20 mg daily for high cholesterol   I have ordered the following labs for you:   Lab Orders         Microalbumin / Creatinine Urine Ratio         Glucose, capillary         POC Hbg A1C      I have ordered the following medication/changed the following medications:   Stop the following medications: There are no discontinued medications.   Start the following medications: Meds ordered this encounter  Medications   rosuvastatin  (CRESTOR ) 20 MG tablet    Sig: Take 1 tablet (20 mg total) by mouth daily.    Dispense:  90 tablet    Refill:  3     Follow up: 2 months    Remember: To check your blood pressure and keep a daily log   Should you have any questions or concerns please call the internal medicine clinic at [phone].     Please note that our late policy has changed.  If you are more than 15 minutes late to your appointment, you may be asked to reschedule your appointment.  Dr. Kandis, D.O. [facility]

## 2024-01-08 NOTE — Assessment & Plan Note (Signed)
 Patient reports fifth metatarsal pain of the right foot that started a few weeks prior after she injured her fifth metatarsal on a bed frame while walking.  Patient stated that the top of the toe felt like it was stoved into the bed frame.  Since then, she continues to have throbbing right fifth metatarsal pain primarily on the dorsal surface.  She reports pain with walking, wearing shoes, and touching the toe.  She has tried Voltaren gel and is on a chronic pain medicine regimen that has not helped with the pain.  On exam, there is decreased range of motion of the fifth right metatarsal due to pain.  I was unable to appreciate erythema, bruising, increase in warmth, edema of the fifth right metatarsal.  I suspect that there could possibly be a closed fracture of the right fifth metatarsal. Plan: - Obtain x-ray of the right fifth metatarsal -Provided buddy taping of the right fifth metatarsal -Continue supportive therapy mentioned above

## 2024-01-08 NOTE — Assessment & Plan Note (Signed)
 Last LDL in 2024 was 144. With her history of a CVA, LDL goal is <70. Patient is willing to start crestor  20 mg today. Plan: -Start crestor  20 mg -Repeat Lipid profile in 2 months at follow up appointment

## 2024-01-08 NOTE — Progress Notes (Signed)
 Internal Medicine Clinic Attending  Case discussed with the resident at the time of the visit.  We reviewed the resident's history and exam and pertinent patient test results.  I agree with the assessment, diagnosis, and plan of care documented in the resident's note.

## 2024-01-09 LAB — MICROALBUMIN / CREATININE URINE RATIO
Creatinine, Urine: 48.9 mg/dL
Microalb/Creat Ratio: <6 mg/g{creat} (ref 0–29)
Microalbumin, Urine: <3 ug/mL

## 2024-01-10 ENCOUNTER — Ambulatory Visit: Payer: Self-pay | Admitting: Student

## 2024-01-18 ENCOUNTER — Telehealth: Payer: Self-pay | Admitting: *Deleted

## 2024-01-18 NOTE — Telephone Encounter (Signed)
 Message sent to C. Shirlean to see if new referral will need to be sent.  Copied from CRM #[id number]. Topic: Clinical - Medical Advice >> Jan 18, 2024  9:36 AM Diannia H wrote: Reason for CRM: Patient is wanting to talk to the provider about a clinic. She stated that the doctor at the pain management clinic she informed the patient that she was moving her practice to another location and she would be good because she would have transportation to the new location but that's not the case. She called on the 15th and they informed her that her PCP would have to deal with this issue due to her insurance. Could you assist? Patients callback number is [phone].

## 2024-01-22 ENCOUNTER — Telehealth: Payer: Self-pay | Admitting: *Deleted

## 2024-01-22 DIAGNOSIS — F1120 Opioid dependence, uncomplicated: Secondary | ICD-10-CM

## 2024-01-22 NOTE — Telephone Encounter (Unsigned)
 Copied from CRM #[id number]. Topic: Clinical - Medical Advice >> Jan 18, 2024  9:36 AM Diannia H wrote: Reason for CRM: Patient is wanting to talk to the provider about a clinic. She stated that the doctor at the pain management clinic she informed the patient that she was moving her practice to another location and she would be good because she would have transportation to the new location but that's not the case. She called on the 15th and they informed her that her PCP would have to deal with this issue due to her insurance. Could you assist? Patients callback number is [phone]. >> Jan 22, 2024  9:04 AM Diannia H wrote: Patient is returning a call. Could you please call her back about the issue below? Patients callback number is [phone].

## 2024-01-23 ENCOUNTER — Other Ambulatory Visit: Payer: Self-pay | Admitting: Student

## 2024-01-23 DIAGNOSIS — Q453 Other congenital malformations of pancreas and pancreatic duct: Secondary | ICD-10-CM

## 2024-01-25 DIAGNOSIS — R1032 Left lower quadrant pain: Secondary | ICD-10-CM | POA: Diagnosis not present

## 2024-01-25 DIAGNOSIS — G894 Chronic pain syndrome: Secondary | ICD-10-CM | POA: Diagnosis not present

## 2024-01-25 DIAGNOSIS — Z79899 Other long term (current) drug therapy: Secondary | ICD-10-CM | POA: Diagnosis not present

## 2024-01-25 DIAGNOSIS — S92911A Unspecified fracture of right toe(s), initial encounter for closed fracture: Secondary | ICD-10-CM | POA: Diagnosis not present

## 2024-01-25 DIAGNOSIS — K861 Other chronic pancreatitis: Secondary | ICD-10-CM | POA: Diagnosis not present

## 2024-01-26 NOTE — Addendum Note (Signed)
 Addended by: ELICIA SHARPER on: 01/26/2024 11:31 AM   Modules accepted: Orders

## 2024-02-19 ENCOUNTER — Ambulatory Visit: Payer: Self-pay

## 2024-02-19 NOTE — Telephone Encounter (Signed)
 Pt's appt is Wednesday 10/22 with Dr Nooruddin.

## 2024-02-19 NOTE — Telephone Encounter (Signed)
 FYI Only or Action Required?: FYI only for provider.  Patient was last seen in primary care on 01/08/2024 by Kandis Perkins, DO.  Called Nurse Triage reporting Foot Pain.  Symptoms began several weeks ago.  Interventions attempted: OTC medications: Tylenol  and topical pain ointment, Rest, hydration, or home remedies, and Ice/heat application.  Symptoms are: gradually worsening.  Triage Disposition: See Within 3 Days in Office (overriding See Physician Within 24 Hours)  Patient/caregiver understands and will follow disposition?: Yes                            Copied from CRM #[id number]. Topic: Clinical - Red Word Triage >> Feb 19, 2024  8:45 AM Susanna ORN wrote: Red Word that prompted transfer to Nurse Triage: Patient wants to come back in and have her right pinky toe checked again. States it doesn't seem to be healing & she's having serious pain in her right foot. Also stating that it keeps swelling and she's doing everything they told her to do. Wants to see if she can come and be seen either Wednesday or possibly sooner. Reason for Disposition  Foot or toe pain  Answer Assessment - Initial Assessment Questions 1. SYMPTOM: What's the main symptom you're concerned about? (e.g., rash, sore, callus, drainage, numbness)     Pain, swelling and discoloration of right pinky toe 2. LOCATION: Where is the pain and swelling located? (e.g., foot/toe, top/bottom, left/right)     Right pink toe 3. APPEARANCE: What does the area look like? (e.g., normal, red, swollen; size)     Swollen, discolored/deep redness 4. ONSET: When did the symptoms start?     3-4 weeks 5. PAIN: Is there any pain? If Yes, ask: How bad is it? (Scale: 0-10; none, mild, moderate, severe)     Yes, describes pain as burning 6. CAUSE: What do you think is causing the symptoms?     States she injured/banged the foot, states she was getting out of bed and jammed her toes/foot on the  bed 7. BLOOD GLUCOSE: What is your blood glucose level?      Unsure 8. USUAL RANGE: What is your blood glucose level usually? (e.g., usual fasting morning value, usual evening value)     Unsure 9. OTHER SYMPTOMS: Do you have any other symptoms? (e.g., fever, weakness)     Denies fever, every now and then my toe feels numb 10. PREGNANCY: Is there any chance you are pregnant? When was your last menstrual period?     N/A    Patient stated she was already seen in the office for this (01/08/24). Patient expressed concern that foot is not healing and she has diabetes. Patient stated she originally thought symptoms were improving, but pain and swelling have worsened at this point. Patient requested an appointment on Wednesday because her husband has treatments today/tomorrow.  Protocols used: Diabetes - Foot Problems and Questions-A-AH

## 2024-02-21 ENCOUNTER — Ambulatory Visit (INDEPENDENT_AMBULATORY_CARE_PROVIDER_SITE_OTHER): Payer: Self-pay | Admitting: Student

## 2024-02-21 ENCOUNTER — Ambulatory Visit
Admission: RE | Admit: 2024-02-21 | Discharge: 2024-02-21 | Disposition: A | Source: Ambulatory Visit | Attending: Internal Medicine

## 2024-02-21 VITALS — BP 152/64 | HR 68 | Ht 60.0 in | Wt 105.0 lb

## 2024-02-21 DIAGNOSIS — M79674 Pain in right toe(s): Secondary | ICD-10-CM | POA: Diagnosis not present

## 2024-02-21 NOTE — Assessment & Plan Note (Signed)
 Subacute injury with persistent pain and swelling. Conservative management ineffective. Further details listed above in HPI.   Plan: - Order x-ray of the right foot to assess for fracture or other injury. - Refer to orthopedic or foot specialist if x-ray indicates further evaluation is needed. - Advise rest and minimize weight-bearing activities to aid healing. - Discussed the difficulty of healing in toes due to frequent use and weight-bearing.

## 2024-02-21 NOTE — Patient Instructions (Addendum)
 Thank you so much for coming to the clinic today!   The fastest way to get an X-Ray done is at [address] at [facility]  If you get this done today we can talk about next steps once the results come back  If you have any questions please feel free to the call the clinic at anytime at [phone]. It was a pleasure seeing you!  Best, Dr. Misheel Gowans

## 2024-02-21 NOTE — Progress Notes (Signed)
 CC: Right 5th metatarsal pain  HPI:  [name] is a 68 y.o. female living with a history stated below and presents today for right fifth metatarsal pain. Please see problem based assessment and plan for additional details.  Discussed the use of AI scribe software for clinical note transcription with the patient, who gave verbal consent to proceed.  History of Present Illness [name] is a 68 year old female who presents with persistent pain and swelling in the right pinky toe following an injury.  She has persistent pain and swelling in her right pinky toe after accidentally banging her foot against the bed frame a few weeks ago. The pain is described as burning, with deep redness and discoloration of the toe. It is exacerbated by wearing shoes, which she finds intolerable due to pressure on the toe. She can only wear wide shoes and experiences increased pain by the end of the day, especially if she has been on her feet for extended periods.  She has attempted various treatments including Tylenol , topical pain ointment, ice, heat, soaking, and wrapping the toe with tape, but reports no significant improvement. The pain is not constant but feels like 'someone's holding a match' to her toe, particularly under the toe and around the top. The swelling fluctuates, sometimes decreasing but then returning. She has not been able to wear regular shoes comfortably and has been limited in her activities due to the pain.  Previously, she was advised to get an x-ray, which was not completed. She was initially seen for this issue on September 8th, and supportive therapy was recommended at that time. She is concerned that the condition has not improved despite following the recommended treatments.  In addition to the toe injury, she mentions a recent pancreatitis flare-up, which occurred around the time of the toe injury.    Past Medical History:  Diagnosis Date   Abdominal  pain 02/02/2022   Abdominal pain, chronic, left upper quadrant 09/21/2016   Overview:  Added automatically from request for surgery [id number]   Ankle injury, right, initial encounter 04/08/2020   IOV 04/08/20   Arthralgia 01/19/2018   Beta thalassemia (HCC)    Blood dyscrasia    thalasemia, sickle cell trait   Childhood asthma    Chronic abdominal pain    Unclear etiology, thought to be due to chronic pancreatitis previously in setting of her pancreatic divisum - however, EUS and EGD performed at [facility]  (10/05/2011) showing normal esophageall, gastric, duodenal mucosa. EUS showing no pancreatic masses, cysts, or changes of chronic pancreatitis. Biliary system nondilated and had no endosonographic abnormalities.   Colon cancer screening 05/23/2016   CVA (cerebral vascular accident) [facility]) 1993   Per report by patient she had a stroke and prolonged rehab course; still weak on left side but not much (03/29/2017)   Encounter for screening mammogram for breast cancer 12/02/2016   Family history of adverse reaction to anesthesia    mom took a long while to wake up; she was allergic to it (03/29/2017)   Grief reaction 02/24/2017   History of blood transfusion 1980s   related to minor sickle cell crisis (03/29/2017)   HLD (hyperlipidemia) 2009   Hypertension    Iron deficiency anemia 11/16/2011   Migraine headache    q couple years now (03/29/2017)   Otitis media 08/24/2021   Palpitation 07/18/2016   Pancreatic divisum    S/P ERCP with stenting 07/2010 at [facility], then stent removal (08/05/2010)   Pneumonia  had it in my teens 3 times (03/29/2017)   Pulmonary embolus (HCC) 08/2004   Two areas of V/Q mismatch. Findings compatible  with high probability for pulmonary embolus.; pt was on coumadin for 1 year   Right calf pain 05/18/2018   Seizures (HCC) 1993   after the stroke in 1993; none for years now  (03/29/2017)   Sickle cell trait    Thalassemia    Type II diabetes  mellitus (HCC) 2010   well controlled   Uterine cancer (HCC)     Current Outpatient Medications on File Prior to Visit  Medication Sig Dispense Refill   acetaminophen  (TYLENOL ) 500 MG tablet Take 2 tablets (1,000 mg total) by mouth every 8 (eight) hours as needed for mild pain or moderate pain. 30 tablet 0   fluticasone  (FLONASE ) 50 MCG/ACT nasal spray Place 2 sprays into both nostrils daily for 7 days. 9.9 mL 0   lipase/protease/amylase (CREON ) 36000 UNITS CPEP capsule Take 2 capsules (72,000 Units total) by mouth 3 (three) times daily with meals. May also take 1 capsule (36,000 Units total) as needed (with snacks - up to 4 snacks daily). 300 capsule 11   lisinopril -hydrochlorothiazide  (ZESTORETIC ) 20-12.5 MG tablet TAKE 1 TABLET BY MOUTH DAILY 90 tablet 3   metFORMIN  (GLUCOPHAGE ) 1000 MG tablet TAKE 1 TABLET(1000 MG) BY MOUTH TWICE DAILY WITH A MEAL 180 tablet 2   Multiple Vitamin (MULTIVITAMIN PO) Take 1 tablet by mouth daily.     promethazine  (PHENERGAN ) 12.5 MG tablet TAKE 1 TABLET(12.5 MG) BY MOUTH EVERY 8 HOURS AS NEEDED 60 tablet 1   rosuvastatin  (CRESTOR ) 20 MG tablet Take 1 tablet (20 mg total) by mouth daily. 90 tablet 3   No current facility-administered medications on file prior to visit.    Family History  Problem Relation Age of Onset   Prostate cancer Father    Cancer Father        stomach ca died x 1 year ago   Sickle cell anemia Brother    Lung cancer Maternal Aunt    Lung cancer Maternal Uncle    Breast cancer Maternal Grandmother    Pancreatic cancer Maternal Grandmother    Heart disease Maternal Grandfather    Heart disease Paternal Grandfather    Diabetes Other    Cancer Other        multiple cancers pancreas,colon, breast    Social History   Socioeconomic History   Marital status: Married    Spouse name: Not on file   Number of children: Not on file   Years of education: 12   Highest education level: Not on file  Occupational History    Employer:  UNEMPLOYED  Tobacco Use   Smoking status: Former    Current packs/day: 0.00    Average packs/day: 0.5 packs/day for 30.0 years (15.0 ttl pk-yrs)    Types: Cigarettes    Start date: 12/31/1968    Quit date: 01/01/1999    Years since quitting: 25.1   Smokeless tobacco: Never  Vaping Use   Vaping status: Never Used  Substance and Sexual Activity   Alcohol use: No    Alcohol/week: 0.0 standard drinks of alcohol   Drug use: No   Sexual activity: Not Currently    Partners: Male  Other Topics Concern   Not on file  Social History Narrative   Worked at [employer] as a Merchandiser, retail, on Health visitor about 9 hours/ day   Social Drivers of Corporate investment banker Strain: High  Risk (04/13/2023)   Overall Financial Resource Strain (CARDIA)    Difficulty of Paying Living Expenses: Very hard  Food Insecurity: No Food Insecurity (12/12/2023)   Hunger Vital Sign    Worried About Running Out of Food in the Last Year: Never true    Ran Out of Food in the Last Year: Never true  Transportation Needs: No Transportation Needs (12/12/2023)   PRAPARE - Administrator, Civil Service (Medical): No    Lack of Transportation (Non-Medical): No  Physical Activity: Unknown (04/13/2023)   Exercise Vital Sign    Days of Exercise per Week: 7 days    Minutes of Exercise per Session: Not on file  Stress: No Stress Concern Present (04/13/2023)   [employer] of Occupational Health - Occupational Stress Questionnaire    Feeling of Stress : Not at all  Social Connections: Socially Integrated (04/13/2023)   Social Connection and Isolation Panel    Frequency of Communication with Friends and Family: More than three times a week    Frequency of Social Gatherings with Friends and Family: More than three times a week    Attends Religious Services: More than 4 times per year    Active Member of [employer] or Organizations: Yes    Attends Engineer, structural: More than 4 times per year    Marital  Status: Married  Catering manager Violence: Not At Risk (04/13/2023)   Humiliation, Afraid, Rape, and Kick questionnaire    Fear of Current or Ex-Partner: No    Emotionally Abused: No    Physically Abused: No    Sexually Abused: No    Review of Systems: ROS negative except for what is noted on the assessment and plan.  Vitals:   02/21/24 0809  BP: (!) 152/64  Pulse: 68  TempSrc: Oral  SpO2: 100%  Weight: 105 lb (47.6 kg)  Height: 5' (1.524 m)    Physical Exam: Constitutional: well-appearing female  in no acute distress Cardiovascular: regular rate and rhythm, no m/r/g Pulmonary/Chest: normal work of breathing on room air, lungs clear to auscultation bilaterally Abdominal: soft, non-tender, non-distended MSK: normal bulk and tone, no swelling or bruising present on right fifth metatarsal, painful to palpation.       Assessment & Plan:   5th metatarsal Toe pain, right Chronic injury with persistent pain and swelling. Conservative management ineffective.  - Order x-ray of the right foot to assess for fracture or other injury. - Refer to orthopedic or foot specialist if x-ray indicates further evaluation is needed. - Advise rest and minimize weight-bearing activities to aid healing. - Discussed the difficulty of healing in toes due to frequent use and weight-bearing.         Patient discussed with Dr. Trudy Dirks Krysteena Stalker, M.D. [facility] Internal Medicine, PGY-3 Pager: [phone] Date 02/21/2024 Time 8:49 AM

## 2024-02-23 ENCOUNTER — Telehealth: Payer: Self-pay | Admitting: *Deleted

## 2024-02-23 ENCOUNTER — Other Ambulatory Visit: Payer: Self-pay | Admitting: Student

## 2024-02-23 DIAGNOSIS — F1120 Opioid dependence, uncomplicated: Secondary | ICD-10-CM

## 2024-02-23 MED ORDER — MORPHINE SULFATE ER 30 MG PO TBCR: 30.0000 mg | EXTENDED_RELEASE_TABLET | Freq: Two times a day (BID) | ORAL | 0 refills | Status: DC

## 2024-02-23 NOTE — Telephone Encounter (Signed)
 Copied from CRM [id number]. Topic: Clinical - Lab/Test Results >> Feb 23, 2024  9:32 AM [name] wrote: Reason for RMF:Eozjdz call patient regarding her imaging results

## 2024-02-23 NOTE — Progress Notes (Signed)
 Called patient to discuss results of Right foot Xray which was negative for acute processes.   During the conversation patient informed me that she will run out of her chronic Morphine  30 mg BID tomorrow 10/25. Patient was prescribed morphine  through a pain clinic with [facility] followed by morphine  prescribed by the [facility]. Patient reports being let go from the [facility] clinic; although, there is no documentation confirming this statement. Patient called the number provided for the new pain clinic referral and left a voicemail to schedule an appointment.   I discussed this patient with Dr. Trudy and we called her formal pain clinic [facility] and they were closed this afternoon. In the past (August) a bridge was prescribed between her pain management specialists.   I informed her that it is her responsibility to notify the IM clinic more than one day prior to running out of her medications. Especially since she was in the clinic on Wednesday for an in person appt and there is no documentation of patient reporting running out of her morphine .   Patient reports understanding.  Patient was also notified that we are not her primary prescribers of morphine  and that this is a ONE time circumstance.     Plan: -Given the risk of withdrawal from chronic opioid therapy and the prior bridge refill sent from our clinic: will send in a refill to cover the weekend -Patient notified to call her prior pain clinic for longer refill -Patient notified to follow up with NEW referral placed AND authorized for a new pain clinic

## 2024-02-26 ENCOUNTER — Ambulatory Visit: Payer: Self-pay

## 2024-02-26 NOTE — Telephone Encounter (Signed)
 FYI Only or Action Required?: Action required by provider: Medication Request.  Patient was last seen in primary care on 02/21/2024 by Nooruddin, Saad, MD.  Called Nurse Triage reporting Pain.  Symptoms began several months ago.  Interventions attempted: Prescription medications: Pain medication.  Symptoms are: unchanged.  Triage Disposition: Call PCP When Office is Open  Patient/caregiver understands and will follow disposition?: Yes Reason for Disposition  [1] Caller requesting NON-URGENT health information AND [2] PCP's office is the best resource  Answer Assessment - Initial Assessment Questions Patient states she's never been without her medication and does not know what to do. Please advise.   1. REASON FOR CALL: What is the main reason for your call? or How can I best help you?     Patient is calling she cannot get in touch with pain management, she filled out intake forms and is waiting to hear from them.   2. SYMPTOMS : Do you have any symptoms?      In pain without medication  Protocols used: Information Only Call - No Triage-A-AH  Copied from CRM [id number]. Topic: Clinical - Red Word Triage >> Feb 26, 2024  3:13 PM Graeme ORN wrote: Red Word that prompted transfer to Nurse Triage: Pain - Pancreatitis - Left side - radiated through back. Unable to get in contact with pain clinic.

## 2024-02-26 NOTE — Telephone Encounter (Signed)
 Pt stated she filled out the intake form; she has not heard from the [facility]. She called their office but was unable to talk to anyone. Stated she's out of medication. I will ask Chilon to f/u.

## 2024-02-27 ENCOUNTER — Other Ambulatory Visit: Payer: Self-pay | Admitting: Student

## 2024-02-27 MED ORDER — MORPHINE SULFATE ER 30 MG PO TBCR: 30.0000 mg | EXTENDED_RELEASE_TABLET | Freq: Two times a day (BID) | ORAL | 0 refills | Status: DC

## 2024-02-27 NOTE — Progress Notes (Signed)
 Unfortunately, patient's pain medicine clinic is moved and they are still unable to see her.  I placed a referral for a new pain medicine clinic, and providing her with her morphine  as she has taken this chronically to help bridge her until she can see the new pain medicine clinic

## 2024-02-28 NOTE — Telephone Encounter (Signed)
 Called pt - no answer.  Left message on pt's vm of rx for pain medication.

## 2024-02-28 NOTE — Progress Notes (Signed)
 Internal Medicine Clinic Attending  Case discussed with the resident at the time of the visit.  We reviewed the resident's history and exam and pertinent patient test results.  I agree with the assessment, diagnosis, and plan of care documented in the resident's note. Would consider a Morton's neuroma etiology (although more common between 3rd-4th toes, can also occur between 4th and 5th) given burning quality of pain.

## 2024-03-13 ENCOUNTER — Ambulatory Visit: Admitting: Student

## 2024-03-13 VITALS — BP 151/63 | HR 70 | Temp 98.2°F | Ht 60.0 in | Wt 104.6 lb

## 2024-03-13 DIAGNOSIS — F1120 Opioid dependence, uncomplicated: Secondary | ICD-10-CM | POA: Diagnosis not present

## 2024-03-13 DIAGNOSIS — M79673 Pain in unspecified foot: Secondary | ICD-10-CM | POA: Diagnosis not present

## 2024-03-13 DIAGNOSIS — Q453 Other congenital malformations of pancreas and pancreatic duct: Secondary | ICD-10-CM

## 2024-03-13 DIAGNOSIS — Z8673 Personal history of transient ischemic attack (TIA), and cerebral infarction without residual deficits: Secondary | ICD-10-CM

## 2024-03-13 DIAGNOSIS — E785 Hyperlipidemia, unspecified: Secondary | ICD-10-CM | POA: Diagnosis not present

## 2024-03-13 DIAGNOSIS — I10 Essential (primary) hypertension: Secondary | ICD-10-CM | POA: Diagnosis not present

## 2024-03-13 DIAGNOSIS — Z87891 Personal history of nicotine dependence: Secondary | ICD-10-CM | POA: Diagnosis not present

## 2024-03-13 DIAGNOSIS — Z79899 Other long term (current) drug therapy: Secondary | ICD-10-CM | POA: Diagnosis not present

## 2024-03-13 DIAGNOSIS — Z1211 Encounter for screening for malignant neoplasm of colon: Secondary | ICD-10-CM

## 2024-03-13 MED ORDER — AMLODIPINE BESYLATE 5 MG PO TABS: 5.0000 mg | ORAL_TABLET | Freq: Every day | ORAL | 11 refills | Status: AC

## 2024-03-13 MED ORDER — LISINOPRIL-HYDROCHLOROTHIAZIDE 20-12.5 MG PO TABS: 1.0000 | ORAL_TABLET | Freq: Every day | ORAL | 3 refills | Status: AC

## 2024-03-13 MED ORDER — PROMETHAZINE HCL 12.5 MG PO TABS: ORAL_TABLET | ORAL | 1 refills | Status: DC

## 2024-03-13 MED ORDER — ROSUVASTATIN CALCIUM 10 MG PO TABS: 10.0000 mg | ORAL_TABLET | Freq: Every day | ORAL | 11 refills | Status: AC

## 2024-03-13 NOTE — Patient Instructions (Addendum)
 Thank you so much for coming to the clinic today!   We are checking your cholesterol again, and restarting your Crestor  at 10mg  I have also added on a medication called amlodipine  to your blood pressure regimen    The number for the pain clinic is  [facility] Pain management physician in [place], [place]  Address: [address] [place], [place] [address]  Phone: [phone]  If you have any questions please feel free to the call the clinic at anytime at [phone]. It was a pleasure seeing you!  Best, Dr. Ghislaine Harcum

## 2024-03-14 ENCOUNTER — Ambulatory Visit: Payer: Self-pay | Admitting: Student

## 2024-03-14 LAB — LIPID PANEL
Chol/HDL Ratio: 2.5 ratio (ref 0.0–4.4)
Cholesterol, Total: 216 mg/dL — ABNORMAL HIGH (ref 100–199)
HDL: 86 mg/dL
LDL Chol Calc (NIH): 116 mg/dL — ABNORMAL HIGH (ref 0–99)
Triglycerides: 82 mg/dL (ref 0–149)
VLDL Cholesterol Cal: 14 mg/dL (ref 5–40)

## 2024-03-14 NOTE — Assessment & Plan Note (Signed)
 Referral sent to GI for colonoscopy as she is due.

## 2024-03-14 NOTE — Assessment & Plan Note (Addendum)
 Severe leg cramps with Crestor  (rosuvastatin ) 20 mg led to self-discontinuation. Open to alternative medications.  Has history of stroke 20 years ago, as well as type 2 diabetes and hypertension.  Will trial Crestor  10 mg. - Discussed alternative statin options with her.

## 2024-03-14 NOTE — Assessment & Plan Note (Signed)
 Blood pressure continuously elevated historically, and also today at 151/63.  She is currently taking lisinopril  hydrochlorothiazide  combination pill 20-12.5, which she took just before coming to her appointment.  She does not check her blood pressure at home, however given her consistent elevations I think she would benefit from another agent to help better control.  Will initiate amlodipine  5 mg, can uptitrate if need be.

## 2024-03-14 NOTE — Assessment & Plan Note (Addendum)
 Experiencing difficulty in scheduling an appointment with pain management due to administrative delays. Concerned about morphine  supply.  Refilled her promethazine  as well as a side effect from her opioid use.  Discussed with her that promethazine  prescription will need to be taken over as well by her pain management doctors when she is able to establish with a new one.  - Provided patient number with pain management clinic - Still has a couple weeks left of her supply, if she is unable to get with pain management and runs out before then, can refill for short term course

## 2024-03-14 NOTE — Progress Notes (Signed)
 CC: Chronic condition management  HPI:  [name] is a 68 y.o. female living with a history stated below and presents today for chronic condition management. Please see problem based assessment and plan for additional details.  Discussed the use of AI scribe software for clinical note transcription with the patient, who gave verbal consent to proceed.  History of Present Illness [name] is a 68 year old female who presents for chronic condition management.  She experiences significant foot pain that has improved over time with home remedies. She has been soaking her foot in vinegar and oil, icing it, and applying pain cream. Initially, she was not following these treatments consistently, which she believes hindered her recovery. However, after adhering to this regimen, she noticed improvement over the past three weeks. Despite the improvement, she still cannot wear hard shoes as they cause discomfort, but she can walk without limping.  She mentions issues with her pain medication management. She has been in contact with a pain management service but has experienced delays in securing an appointment. She has completed all necessary paperwork and is awaiting a call for her appointment. She currently has a couple of weeks' supply of morphine  left.  She discusses her experience with cholesterol medication, specifically a statin, which caused severe leg cramps. She mentioned to me that she already has 'sickle' and cannot tolerate leg cramps from medication. She was prescribed Crestor  20 mg, which she discontinued due to the cramps. She is open to trying alternative medications that do not cause leg pain.  She also mentions her need for a colonoscopy. She encountered issues with scheduling due to previous procedures being done at a different hospital. Her last colonoscopy was a few years ago, and she is seeking to have it done locally.    Past Medical History:   Diagnosis Date   Abdominal pain 02/02/2022   Abdominal pain, chronic, left upper quadrant 09/21/2016   Overview:  Added automatically from request for surgery [id number]   Ankle injury, right, initial encounter 04/08/2020   IOV 04/08/20   Arthralgia 01/19/2018   Beta thalassemia (HCC)    Blood dyscrasia    thalasemia, sickle cell trait   Childhood asthma    Chronic abdominal pain    Unclear etiology, thought to be due to chronic pancreatitis previously in setting of her pancreatic divisum - however, EUS and EGD performed at [facility]  (10/05/2011) showing normal esophageall, gastric, duodenal mucosa. EUS showing no pancreatic masses, cysts, or changes of chronic pancreatitis. Biliary system nondilated and had no endosonographic abnormalities.   Colon cancer screening 05/23/2016   CVA (cerebral vascular accident) [facility]) 1993   Per report by patient she had a stroke and prolonged rehab course; still weak on left side but not much (03/29/2017)   Encounter for screening mammogram for breast cancer 12/02/2016   Family history of adverse reaction to anesthesia    mom took a long while to wake up; she was allergic to it (03/29/2017)   Grief reaction 02/24/2017   History of blood transfusion 1980s   related to minor sickle cell crisis (03/29/2017)   HLD (hyperlipidemia) 2009   Hypertension    Iron deficiency anemia 11/16/2011   Migraine headache    q couple years now (03/29/2017)   Otitis media 08/24/2021   Palpitation 07/18/2016   Pancreatic divisum    S/P ERCP with stenting 07/2010 at [facility], then stent removal (08/05/2010)   Pneumonia    had it in my teens 3 times (  03/29/2017)   Pulmonary embolus (HCC) 08/2004   Two areas of V/Q mismatch. Findings compatible  with high probability for pulmonary embolus.; pt was on coumadin for 1 year   Right calf pain 05/18/2018   Seizures (HCC) 1993   after the stroke in 1993; none for years now  (03/29/2017)   Sickle cell trait     Thalassemia    Type II diabetes mellitus (HCC) 2010   well controlled   Uterine cancer (HCC)     Current Outpatient Medications on File Prior to Visit  Medication Sig Dispense Refill   acetaminophen  (TYLENOL ) 500 MG tablet Take 2 tablets (1,000 mg total) by mouth every 8 (eight) hours as needed for mild pain or moderate pain. 30 tablet 0   fluticasone  (FLONASE ) 50 MCG/ACT nasal spray Place 2 sprays into both nostrils daily for 7 days. 9.9 mL 0   lipase/protease/amylase (CREON ) 36000 UNITS CPEP capsule Take 2 capsules (72,000 Units total) by mouth 3 (three) times daily with meals. May also take 1 capsule (36,000 Units total) as needed (with snacks - up to 4 snacks daily). 300 capsule 11   metFORMIN  (GLUCOPHAGE ) 1000 MG tablet TAKE 1 TABLET(1000 MG) BY MOUTH TWICE DAILY WITH A MEAL 180 tablet 2   morphine  (MS CONTIN ) 30 MG 12 hr tablet Take 1 tablet (30 mg total) by mouth every 12 (twelve) hours. 60 tablet 0   Multiple Vitamin (MULTIVITAMIN PO) Take 1 tablet by mouth daily.     No current facility-administered medications on file prior to visit.    Family History  Problem Relation Age of Onset   Prostate cancer Father    Cancer Father        stomach ca died x 1 year ago   Sickle cell anemia Brother    Lung cancer Maternal Aunt    Lung cancer Maternal Uncle    Breast cancer Maternal Grandmother    Pancreatic cancer Maternal Grandmother    Heart disease Maternal Grandfather    Heart disease Paternal Grandfather    Diabetes Other    Cancer Other        multiple cancers pancreas,colon, breast    Social History   Socioeconomic History   Marital status: Married    Spouse name: Not on file   Number of children: Not on file   Years of education: 12   Highest education level: Not on file  Occupational History    Employer: UNEMPLOYED  Tobacco Use   Smoking status: Former    Current packs/day: 0.00    Average packs/day: 0.5 packs/day for 30.0 years (15.0 ttl pk-yrs)    Types:  Cigarettes    Start date: 12/31/1968    Quit date: 01/01/1999    Years since quitting: 25.2   Smokeless tobacco: Never  Vaping Use   Vaping status: Never Used  Substance and Sexual Activity   Alcohol use: No    Alcohol/week: 0.0 standard drinks of alcohol   Drug use: No   Sexual activity: Not Currently    Partners: Male  Other Topics Concern   Not on file  Social History Narrative   Worked at [employer] as a merchandiser, retail, on health visitor about 9 hours/ day   Social Drivers of Health   Financial Resource Strain: High Risk (04/13/2023)   Overall Financial Resource Strain (CARDIA)    Difficulty of Paying Living Expenses: Very hard  Food Insecurity: No Food Insecurity (12/12/2023)   Hunger Vital Sign    Worried About Running Out  of Food in the Last Year: Never true    Ran Out of Food in the Last Year: Never true  Transportation Needs: No Transportation Needs (12/12/2023)   PRAPARE - Administrator, Civil Service (Medical): No    Lack of Transportation (Non-Medical): No  Physical Activity: Unknown (04/13/2023)   Exercise Vital Sign    Days of Exercise per Week: 7 days    Minutes of Exercise per Session: Not on file  Stress: No Stress Concern Present (04/13/2023)   [employer] of Occupational Health - Occupational Stress Questionnaire    Feeling of Stress : Not at all  Social Connections: Socially Integrated (04/13/2023)   Social Connection and Isolation Panel    Frequency of Communication with Friends and Family: More than three times a week    Frequency of Social Gatherings with Friends and Family: More than three times a week    Attends Religious Services: More than 4 times per year    Active Member of [employer] or Organizations: Yes    Attends Engineer, Structural: More than 4 times per year    Marital Status: Married  Catering Manager Violence: Not At Risk (04/13/2023)   Humiliation, Afraid, Rape, and Kick questionnaire    Fear of Current or Ex-Partner: No     Emotionally Abused: No    Physically Abused: No    Sexually Abused: No    Review of Systems: ROS negative except for what is noted on the assessment and plan.  Vitals:   03/13/24 0901 03/13/24 0936  BP: (!) 143/71 (!) 151/63  Pulse: 76 70  Temp: 98.2 F (36.8 C)   TempSrc: Oral   SpO2: 100%   Weight: 104 lb 9.6 oz (47.4 kg)   Height: 5' (1.524 m)     Physical Exam: Constitutional: well-appearing female in no acute distress HENT: normocephalic atraumatic, mucous membranes moist Eyes: conjunctiva non-erythematous Neck: supple Cardiovascular: regular rate and rhythm, no m/r/g Pulmonary/Chest: normal work of breathing on room air, lungs clear to auscultation bilaterally   Assessment & Plan:   Opioid dependence (HCC) Experiencing difficulty in scheduling an appointment with pain management due to administrative delays. Concerned about morphine  supply.  Refilled her promethazine  as well as a side effect from her opioid use.  Discussed with her that promethazine  prescription will need to be taken over as well by her pain management doctors when she is able to establish with a new one.  - Provided patient number with pain management clinic - Still has a couple weeks left of her supply, if she is unable to get with pain management and runs out before then, can refill for short term course  Hyperlipidemia Severe leg cramps with Crestor  (rosuvastatin ) 20 mg led to self-discontinuation. Open to alternative medications.  Has history of stroke 20 years ago, as well as type 2 diabetes and hypertension.  Will trial Crestor  10 mg. - Discussed alternative statin options with her.    Encounter for screening colonoscopy Referral sent to GI for colonoscopy as she is due.  Essential hypertension Blood pressure continuously elevated historically, and also today at 151/63.  She is currently taking lisinopril  hydrochlorothiazide  combination pill 20-12.5, which she took just before coming to  her appointment.  She does not check her blood pressure at home, however given her consistent elevations I think she would benefit from another agent to help better control.  Will initiate amlodipine  5 mg, can uptitrate if need be.     Patient discussed with  Dr. Karna Dirks Cheick Suhr, M.D. [facility], PGY-3 Pager: [phone] Date 03/14/2024 Time 8:40 AM

## 2024-03-14 NOTE — Progress Notes (Signed)
 Internal Medicine Clinic Attending  Case discussed with the resident at the time of the visit.  We reviewed the resident's history and exam and pertinent patient test results.  I agree with the assessment, diagnosis, and plan of care documented in the resident's note.

## 2024-04-01 ENCOUNTER — Telehealth: Payer: Self-pay | Admitting: Student

## 2024-04-01 NOTE — Telephone Encounter (Unsigned)
 Copied from CRM #[id number]. Topic: Clinical - Medication Refill >> Apr 01, 2024  9:47 AM Graeme ORN wrote: Medication: morphine  (MS CONTIN ) 30 MG 12 hr tablet  Has the patient contacted their pharmacy? Yes (Agent: If no, request that the patient contact the pharmacy for the refill. If patient does not wish to contact the pharmacy document the reason why and proceed with request.) (Agent: If yes, when and what did the pharmacy advise?) call provider  This is the patient's preferred pharmacy:  [id number] [address], [place] - [address] AT [facility] OF [facility] & [address] [place] [address] [place] [address] Phone: [phone] Fax: [phone]  Is this the correct pharmacy for this prescription? Yes If no, delete pharmacy and type the correct one.   Has the prescription been filled recently? Yes  Is the patient out of the medication? Yes - 1  Has the patient been seen for an appointment in the last year OR does the patient have an upcoming appointment? Yes  Can we respond through MyChart? No  Agent: Please be advised that Rx refills may take up to 3 business days. We ask that you follow-up with your pharmacy.

## 2024-04-02 ENCOUNTER — Other Ambulatory Visit: Payer: Self-pay | Admitting: Student

## 2024-04-02 ENCOUNTER — Telehealth: Payer: Self-pay | Admitting: *Deleted

## 2024-04-02 MED ORDER — MORPHINE SULFATE ER 30 MG PO TBCR: 30.0000 mg | EXTENDED_RELEASE_TABLET | Freq: Two times a day (BID) | ORAL | 0 refills | Status: DC

## 2024-04-02 NOTE — Progress Notes (Signed)
 Spoke with patient in regards to her Morphine  Rx. She does not have an appointment with the pain clinic. She has called many times to the clinic and is frustrated with the lack of communication with the pain clinic.   I messaged Ms Shirlean for assistance with having the contact with the pain clinic.   Plan: -Refill 2 week rx for morphine  -Messaged Ms. Boone for assistance with contact with the pain clinic -If patient is unable to contact the pain management clinic, she will need to come into the clinic and discuss different options for morphine  Rx including our clinic prescribing it: in that case, she will need contract and toxassure.

## 2024-04-02 NOTE — Telephone Encounter (Signed)
 Will froward to PCP.  Patient is no longer going to Pain Management is awaiting an appointment with a new office. Copied from CRM #[id number]. Topic: Clinical - Medication Refill >> Apr 01, 2024  9:47 AM Graeme ORN wrote: Medication: morphine  (MS CONTIN ) 30 MG 12 hr tablet  Has the patient contacted their pharmacy? Yes (Agent: If no, request that the patient contact the pharmacy for the refill. If patient does not wish to contact the pharmacy document the reason why and proceed with request.) (Agent: If yes, when and what did the pharmacy advise?) call provider  This is the patient's preferred pharmacy:  [facility] #[id number] [address], [place] - [address] AT [facility] OF [facility] & [address] [place] [address] Phone: [phone] Fax: [phone]  Is this the correct pharmacy for this prescription? Yes If no, delete pharmacy and type the correct one.   Has the prescription been filled recently? Yes  Is the patient out of the medication? Yes - 1  Has the patient been seen for an appointment in the last year OR does the patient have an upcoming appointment? Yes  Can we respond through MyChart? No  Agent: Please be advised that Rx refills may take up to 3 business days. We ask that you follow-up with your pharmacy. >> Apr 02, 2024  9:44 AM Miquel SAILOR wrote: PT calling on update for medication refill. Out of medication. Needs call back [phone]

## 2024-04-02 NOTE — Telephone Encounter (Signed)
 Call to patient has not gotten into a new pain Clinic as of yet.  Stated has been trying to contact them for an appointment but has received no answer as of yet.                            Copied from CRM #[id number]. Topic: Clinical - Medication Refill >> Apr 01, 2024  9:47 AM Graeme ORN wrote: Medication: morphine  (MS CONTIN ) 30 MG 12 hr tablet  Has the patient contacted their pharmacy? Yes (Agent: If no, request that the patient contact the pharmacy for the refill. If patient does not wish to contact the pharmacy document the reason why and proceed with request.) (Agent: If yes, when and what did the pharmacy advise?) call provider  This is the patient's preferred pharmacy:  [facility] #[id number] [address], [place] - [address] AT [facility] OF [facility] & [address] [address] [place] [address] Phone: [phone] Fax: [phone]  Is this the correct pharmacy for this prescription? Yes If no, delete pharmacy and type the correct one.   Has the prescription been filled recently? Yes  Is the patient out of the medication? Yes - 1  Has the patient been seen for an appointment in the last year OR does the patient have an upcoming appointment? Yes  Can we respond through MyChart? No  Agent: Please be advised that Rx refills may take up to 3 business days. We ask that you follow-up with your pharmacy. >> Apr 02, 2024  9:44 AM Miquel SAILOR wrote: PT calling on update for medication refill. Out of medication. Needs call back [phone]

## 2024-04-02 NOTE — Telephone Encounter (Signed)
 Will forward to PCP.  KYM Stager to call the referred offices for Pain Management to see when appointment may be available for the patient.  No longer has a pain Management  doctor.  Copied from CRM #[id number]. Topic: Clinical - Medication Refill >> Apr 01, 2024  9:47 AM Graeme ORN wrote: Medication: morphine  (MS CONTIN ) 30 MG 12 hr tablet  Has the patient contacted their pharmacy? Yes (Agent: If no, request that the patient contact the pharmacy for the refill. If patient does not wish to contact the pharmacy document the reason why and proceed with request.) (Agent: If yes, when and what did the pharmacy advise?) call provider  This is the patient's preferred pharmacy:  [facility] #[id number] [address], [place] - [address] AT [facility] OF [facility] & [address] [place] [address] [place] [address] Phone: [phone] Fax: [phone]  Is this the correct pharmacy for this prescription? Yes If no, delete pharmacy and type the correct one.   Has the prescription been filled recently? Yes  Is the patient out of the medication? Yes - 1  Has the patient been seen for an appointment in the last year OR does the patient have an upcoming appointment? Yes  Can we respond through MyChart? No  Agent: Please be advised that Rx refills may take up to 3 business days. We ask that you follow-up with your pharmacy. >> Apr 02, 2024  9:44 AM Miquel SAILOR wrote: PT calling on update for medication refill. Out of medication. Needs call back [phone]

## 2024-04-16 ENCOUNTER — Ambulatory Visit

## 2024-04-16 ENCOUNTER — Ambulatory Visit: Payer: Self-pay

## 2024-04-16 ENCOUNTER — Other Ambulatory Visit: Payer: Self-pay | Admitting: *Deleted

## 2024-04-16 VITALS — BP 133/79 | HR 83 | Temp 98.0°F | Ht 60.0 in | Wt 108.0 lb

## 2024-04-16 DIAGNOSIS — I10 Essential (primary) hypertension: Secondary | ICD-10-CM | POA: Diagnosis not present

## 2024-04-16 DIAGNOSIS — E119 Type 2 diabetes mellitus without complications: Secondary | ICD-10-CM | POA: Diagnosis not present

## 2024-04-16 DIAGNOSIS — K861 Other chronic pancreatitis: Secondary | ICD-10-CM

## 2024-04-16 DIAGNOSIS — Z7984 Long term (current) use of oral hypoglycemic drugs: Secondary | ICD-10-CM | POA: Diagnosis not present

## 2024-04-16 DIAGNOSIS — G8929 Other chronic pain: Secondary | ICD-10-CM

## 2024-04-16 DIAGNOSIS — R52 Pain, unspecified: Secondary | ICD-10-CM

## 2024-04-16 MED ORDER — MORPHINE SULFATE ER 30 MG PO TBCR: 30.0000 mg | EXTENDED_RELEASE_TABLET | Freq: Two times a day (BID) | ORAL | 0 refills | Status: DC

## 2024-04-16 NOTE — Telephone Encounter (Signed)
 Pt has an appt today @ 1415 PM with Dr Azadegan.

## 2024-04-16 NOTE — Progress Notes (Signed)
 CC: Chronic abdominal pain / medication refill  HPI:  [name] is a 68 year old female with a complex medical history including chronic abdominal pain attributed to chronic pancreatic disease(pancreatic divisum), prior CVA, type 2 diabetes mellitus, hypertension, hyperlipidemia, and history of pulmonary embolism, who presents today for worsening chronic abdominal pain and interruption of pain management care.  She reports long-standing left upper quadrant and epigastric pain radiating to the back, consistent with her baseline pancreatic pain, which has historically been managed with morphine  through pain management. Over the past several weeks, she has experienced significant difficulty establishing care with a new pain clinic due to insurance incompatibility, clinic relocation, lack of follow-up communication, and transportation limitations. As a result, she has run out of her prescribed morphine , despite prior expectations that pain clinic follow-up would be established before medication lapse.  Her pain today is similar in quality and location to prior flares, without new features. She denies fever, vomiting, chest pain, shortness of breath, bowel or bladder changes, focal neurologic deficits, or recent trauma. She reports adherence to all other chronic medications, including antihypertensives and diabetes medications. She endorses significant psychosocial stress related to her husbands active cancer treatment, which has further limited her ability to travel outside [place] for care.  She presents today seeking temporary pain control and assistance with coordination of pain management referral.  Past Medical History:  Diagnosis Date   Abdominal pain 02/02/2022   Abdominal pain, chronic, left upper quadrant 09/21/2016   Overview:  Added automatically from request for surgery [id number]   Ankle injury, right, initial encounter 04/08/2020   IOV 04/08/20   Arthralgia 01/19/2018    Beta thalassemia (HCC)    Blood dyscrasia    thalasemia, sickle cell trait   Childhood asthma    Chronic abdominal pain    Unclear etiology, thought to be due to chronic pancreatitis previously in setting of her pancreatic divisum - however, EUS and EGD performed at [facility]  (10/05/2011) showing normal esophageall, gastric, duodenal mucosa. EUS showing no pancreatic masses, cysts, or changes of chronic pancreatitis. Biliary system nondilated and had no endosonographic abnormalities.   Colon cancer screening 05/23/2016   CVA (cerebral vascular accident) [facility]) 1993   Per report by patient she had a stroke and prolonged rehab course; still weak on left side but not much (03/29/2017)   Encounter for screening mammogram for breast cancer 12/02/2016   Family history of adverse reaction to anesthesia    mom took a long while to wake up; she was allergic to it (03/29/2017)   Grief reaction 02/24/2017   History of blood transfusion 1980s   related to minor sickle cell crisis (03/29/2017)   HLD (hyperlipidemia) 2009   Hypertension    Iron deficiency anemia 11/16/2011   Migraine headache    q couple years now (03/29/2017)   Otitis media 08/24/2021   Palpitation 07/18/2016   Pancreatic divisum    S/P ERCP with stenting 07/2010 at [facility], then stent removal (08/05/2010)   Pneumonia    had it in my teens 3 times (03/29/2017)   Pulmonary embolus (HCC) 08/2004   Two areas of V/Q mismatch. Findings compatible  with high probability for pulmonary embolus.; pt was on coumadin for 1 year   Right calf pain 05/18/2018   Seizures (HCC) 1993   after the stroke in 1993; none for years now  (03/29/2017)   Sickle cell trait    Thalassemia    Type II diabetes mellitus (HCC) 2010   well controlled  Uterine cancer (HCC)     Medications Ordered Prior to Encounter[1]  Family History  Problem Relation Age of Onset   Prostate cancer Father    Cancer Father        stomach ca died x 1  year ago   Sickle cell anemia Brother    Lung cancer Maternal Aunt    Lung cancer Maternal Uncle    Breast cancer Maternal Grandmother    Pancreatic cancer Maternal Grandmother    Heart disease Maternal Grandfather    Heart disease Paternal Grandfather    Diabetes Other    Cancer Other        multiple cancers pancreas,colon, breast    Social History   Socioeconomic History   Marital status: Married    Spouse name: Not on file   Number of children: Not on file   Years of education: 12   Highest education level: Not on file  Occupational History    Employer: UNEMPLOYED  Tobacco Use   Smoking status: Former    Current packs/day: 0.00    Average packs/day: 0.5 packs/day for 30.0 years (15.0 ttl pk-yrs)    Types: Cigarettes    Start date: 12/31/1968    Quit date: 01/01/1999    Years since quitting: 25.3   Smokeless tobacco: Never  Vaping Use   Vaping status: Never Used  Substance and Sexual Activity   Alcohol use: No    Alcohol/week: 0.0 standard drinks of alcohol   Drug use: No   Sexual activity: Not Currently    Partners: Male  Other Topics Concern   Not on file  Social History Narrative   Worked at [employer] as a merchandiser, retail, on health visitor about 9 hours/ day   Social Drivers of Health   Tobacco Use: Medium Risk (01/08/2024)   Patient History    Smoking Tobacco Use: Former    Smokeless Tobacco Use: Never    Passive Exposure: Not on Actuary Strain: High Risk (04/13/2023)   Overall Financial Resource Strain (CARDIA)    Difficulty of Paying Living Expenses: Very hard  Food Insecurity: No Food Insecurity (12/12/2023)   Epic    Worried About Radiation Protection Practitioner of Food in the Last Year: Never true    Ran Out of Food in the Last Year: Never true  Transportation Needs: No Transportation Needs (12/12/2023)   Epic    Lack of Transportation (Medical): No    Lack of Transportation (Non-Medical): No  Physical Activity: Unknown (04/13/2023)   Exercise Vital Sign     Days of Exercise per Week: 7 days    Minutes of Exercise per Session: Not on file  Stress: No Stress Concern Present (04/13/2023)   [employer] of Occupational Health - Occupational Stress Questionnaire    Feeling of Stress : Not at all  Social Connections: Socially Integrated (04/13/2023)   Social Connection and Isolation Panel    Frequency of Communication with Friends and Family: More than three times a week    Frequency of Social Gatherings with Friends and Family: More than three times a week    Attends Religious Services: More than 4 times per year    Active Member of [employer] or Organizations: Yes    Attends Banker Meetings: More than 4 times per year    Marital Status: Married  Catering Manager Violence: Not At Risk (04/13/2023)   Humiliation, Afraid, Rape, and Kick questionnaire    Fear of Current or Ex-Partner: No    Emotionally  Abused: No    Physically Abused: No    Sexually Abused: No  Depression (PHQ2-9): Low Risk (04/16/2024)   Depression (PHQ2-9)    PHQ-2 Score: 0  Alcohol Screen: Low Risk (04/13/2023)   Alcohol Screen    Last Alcohol Screening Score (AUDIT): 0  Housing: Low Risk (12/12/2023)   Epic    Unable to Pay for Housing in the Last Year: No    Number of Times Moved in the Last Year: 0    Homeless in the Last Year: No  Utilities: Not At Risk (04/13/2023)   AHC Utilities    Threatened with loss of utilities: No  Health Literacy: Adequate Health Literacy (04/13/2023)   B1300 Health Literacy    Frequency of need for help with medical instructions: Never    Review of Systems: ROS negative except for what is noted on the assessment and plan.  Vitals:   04/16/24 1414  BP: 133/79  Pulse: 83  Temp: 98 F (36.7 C)  TempSrc: Oral  SpO2: 98%  Weight: 108 lb (49 kg)  Height: 5' (1.524 m)    Physical Exam  Physical Exam: Constitutional: Well-appearing woman sitting comfortably in exam chair, in no acute distress. HENT: Normocephalic,  atraumatic Eyes: Conjunctiva non-erythematous. Cardiovascular: Regular rate and rhythm; no murmurs, rubs, or gallops. Pulmonary/Chest: Normal work of breathing on room air; lungs clear to auscultation bilaterally. Abdominal: Soft, non-distended; mild chronic LUQ/epigastric tenderness without guarding or rebound. Neurological: Alert and oriented 3;normal gait. Psych: Anxious, cooperative, appropriate affect; expresses frustration related to care coordination but no acute distress. Assessment & Plan:   # Chronic abdominal pain / Chronic Pancreatitis # Disruption in pain management care / referral barriers Long-standing pancreatic-type pain previously managed by pain management with long-acting morphine . Current presentation consistent with baseline pain without red-flag symptoms Multiple failed referrals due to insurance non-acceptance, clinic relocation, and lack of communication. Chart review shows referral activity with [facility], [facility] location) Per our referral coordinator, Chilon, multiple pain management clinics were contacted, including six different practices. These included [facility], [facility] (which later relocated to [facility]), [facility] in [place], [facility] in [place], and other local providers. Unfortunately, these referrals were unsuccessful due to a combination of insurance non-acceptance, clinic relocation, or providers declining to accept the patient.  Given these ongoing barriers, the case was discussed with Dr. Rosan, and the decision was made to bring the patient in for follow-up and manage her pain within primary care. This plan included a clear discussion with the patient regarding expectations, review of a pain management agreement, and continuation of her pain medication as a bridge while efforts to establish specialty pain management care continued. The patient was called back and  agreed with this plan. I also requested that both the patient and the front desk staff schedule a follow-up appointment with me within the next month to ensure close monitoring and continuity of care.  - Reordered morphine  ER 30 mg PO q12h (#60, no refills) as a temporary bridge to prevent withdrawal and uncontrolled pain   #Type 2 diabetes mellitus Stable, well controlled. - Continue metformin   #Hypertension Well controlled today. - Continue lisinopril -HCTZ and amlodipine    Patient seen with Dr. CHARLENA Rosan Armando Bernadine M.D [facility] Internal Medicine, PGY-1 Phone: [phone] Date 04/16/2024 Time 3:36 PM     [1]  Current Outpatient Medications on File Prior to Visit  Medication Sig Dispense Refill   acetaminophen  (TYLENOL ) 500  MG tablet Take 2 tablets (1,000 mg total) by mouth every 8 (eight) hours as needed for mild pain or moderate pain. 30 tablet 0   amLODipine  (NORVASC ) 5 MG tablet Take 1 tablet (5 mg total) by mouth daily. 30 tablet 11   fluticasone  (FLONASE ) 50 MCG/ACT nasal spray Place 2 sprays into both nostrils daily for 7 days. 9.9 mL 0   lipase/protease/amylase (CREON ) 36000 UNITS CPEP capsule Take 2 capsules (72,000 Units total) by mouth 3 (three) times daily with meals. May also take 1 capsule (36,000 Units total) as needed (with snacks - up to 4 snacks daily). 300 capsule 11   lisinopril -hydrochlorothiazide  (ZESTORETIC ) 20-12.5 MG tablet Take 1 tablet by mouth daily. 90 tablet 3   metFORMIN  (GLUCOPHAGE ) 1000 MG tablet TAKE 1 TABLET(1000 MG) BY MOUTH TWICE DAILY WITH A MEAL 180 tablet 2   Multiple Vitamin (MULTIVITAMIN PO) Take 1 tablet by mouth daily.     promethazine  (PHENERGAN ) 12.5 MG tablet TAKE 1 TABLET(12.5 MG) BY MOUTH EVERY 8 HOURS AS NEEDED 60 tablet 1   rosuvastatin  (CRESTOR ) 10 MG tablet Take 1 tablet (10 mg total) by mouth daily. 30 tablet 11   No current facility-administered medications on file prior to visit.

## 2024-04-16 NOTE — Patient Instructions (Signed)
 Thank you, [name], for allowing us  to provide your care today.  Today we discussed your chronic pancreatic pain, the recent difficulty accessing pain management care due to insurance and referral issues, and the lapse in pain medication while waiting for a pain clinic appointment. We also reviewed the significant challenges you have faced with transportation and coordination of care, especially in the setting of your husbands ongoing cancer treatment.  At this visit, no laboratory tests were ordered.  No new referrals were placed during todays visit. We reviewed the status of your existing pain management referral, which appears to be in progress with [facility] ([place] location). Our team will follow up with the referral coordinator to close the loop and confirm appointment scheduling, and you will be contacted with updates.  Regarding medications, your morphine  prescription was reordered to ensure continuity of care while pain management is being finalized.   The following medication was started today: Morphine  (MS Contin ) 30 mg extended-release tablet. Take one tablet by mouth every 12 hours. A 30-day supply (60 tablets) was provided with no refills.  Please continue to take this medication exactly as prescribed. If you experience worsening pain, new symptoms, or side effects, or if you are approaching the end of your medication supply and have not yet been seen by pain management, please contact our office.  Follow-up: Please follow up with the Internal Medicine Clinic as instructed, or sooner if symptoms worsen or you have concerns. Our team will also reach out once we have an update from the pain clinic.  Remember: If you have difficulty obtaining appointments, medications, or transportation, please let us  know. We understand how frustrating this process has been, and we are continuing to work to coordinate your care and avoid  further gaps in treatment.  Should you have any questions or concerns, please call the Internal Medicine Clinic at [phone].  Shamond Skelton, M.D. [facility]

## 2024-04-16 NOTE — Telephone Encounter (Signed)
 Pt states that pain clinic will not accept her d/t insurance. Pt states she would like to go to a pain clinic. Pt states that her pain is the same, has not changed, but she is almost out of her pills. Pt asking if there is other pain clinics in [place] that she can go to. Please call pt back and advise.  Pt scheduled today for refill appt.   Copied from CRM [id number]. Topic: Clinical - Red Word Triage >> Apr 16, 2024  8:23 AM Alfonso ORN wrote: Red Word that prompted transfer to Nurse Triage: patient having pain  left side of stomach and goes to her back rate pain an 8  ----------------------------------------------------------------------- From previous Reason for Contact - Referral Status: Reason for CRM: pt called friday to be referred to a pain clinic was told 2  the ear[facility] can see pt in january , pt been waiting for awhile to get into a pain clinic patient was told that  her insurance  her medication is running out

## 2024-04-18 NOTE — Progress Notes (Signed)
 Internal Medicine Clinic Attending  Case discussed with the resident at the time of the visit.  We reviewed the resident's history and exam and pertinent patient test results.  I agree with the assessment, diagnosis, and plan of care documented in the resident's note.

## 2024-04-30 ENCOUNTER — Other Ambulatory Visit: Payer: Self-pay | Admitting: Student

## 2024-04-30 DIAGNOSIS — Q453 Other congenital malformations of pancreas and pancreatic duct: Secondary | ICD-10-CM

## 2024-05-03 ENCOUNTER — Ambulatory Visit: Payer: Self-pay | Admitting: Student

## 2024-05-03 NOTE — Progress Notes (Deleted)
 # Chronic abdominal pain / Chronic Pancreatitis # Disruption in pain management care / referral barriers Long-standing pancreatic-type pain previously managed by pain management with long-acting morphine . Current presentation consistent with baseline pain without red-flag symptoms Multiple failed referrals due to insurance non-acceptance, clinic relocation, and lack of communication. Chart review shows referral activity with [facility], [facility] [facility] location) Per our referral coordinator, Chilon, multiple pain management clinics were contacted, including six different practices. These included [facility], [facility] (which later relocated to [facility]), [facility] in [place], [facility] in [place], and other local providers. Unfortunately, these referrals were unsuccessful due to a combination of insurance non-acceptance, clinic relocation, or providers declining to accept the patient.  Given these ongoing barriers, the case was discussed with Dr. Rosan, and the decision was made to bring the patient in for follow-up and manage her pain within primary care. This plan included a clear discussion with the patient regarding expectations, review of a pain management agreement, and continuation of her pain medication as a bridge while efforts to establish specialty pain management care continued. The patient was called back and agreed with this plan. I also requested that both the patient and the front desk staff schedule a follow-up appointment with me within the next month to ensure close monitoring and continuity of care.  - Reordered morphine  ER 30 mg PO q12h (#60, no refills) as a temporary bridge to prevent withdrawal and uncontrolled pain   #Type 2 diabetes mellitus Stable, well controlled. - Continue metformin   #Hypertension Well controlled today. - Continue lisinopril -HCTZ and amlodipine     CC: Routine Follow Up  for management of chronic medical conditions after last office visit 04/16/2024  HPI:  [name] is a 69 y.o. female with pertinent PMH of hypertension, type 2 diabetes, prior CVA, hyperlipidemia, chronic abdominal pain with chronic opiate use, and GERD who presents as above. Please see assessment and plan below for further details.  Medications: Current Outpatient Medications  Medication Instructions   Acetaminophen  Extra Strength 1,000 mg, Oral, Every 8 hours PRN   amLODipine  (NORVASC ) 5 mg, Oral, Daily   fluticasone  (FLONASE ) 50 MCG/ACT nasal spray 2 sprays, Each Nare, Daily   lipase/protease/amylase (CREON ) 36000 UNITS CPEP capsule Take 2 capsules (72,000 Units total) by mouth 3 (three) times daily with meals. May also take 1 capsule (36,000 Units total) as needed (with snacks - up to 4 snacks daily).   lisinopril -hydrochlorothiazide  (ZESTORETIC ) 20-12.5 MG tablet 1 tablet, Oral, Daily   metFORMIN  (GLUCOPHAGE ) 1000 MG tablet TAKE 1 TABLET(1000 MG) BY MOUTH TWICE DAILY WITH A MEAL   morphine  (MS CONTIN ) 30 mg, Oral, Every 12 hours   Multiple Vitamin (MULTIVITAMIN PO) 1 tablet, Oral, Daily   promethazine  (PHENERGAN ) 12.5 MG tablet TAKE 1 TABLET(12.5 MG) BY MOUTH EVERY 8 HOURS AS NEEDED   rosuvastatin  (CRESTOR ) 10 mg, Oral, Daily     Review of Systems:   Pertinent items noted in HPI and/or A&P.  Physical Exam:  There were no vitals filed for this visit.  Constitutional:***. In no acute distress. HEENT: Normocephalic, atraumatic, Sclera non-icteric, PERRL, EOM intact Cardio:Regular rate and rhythm. 2+ bilateral {PulseLoc:28294} pulses. Pulm:Clear to auscultation bilaterally. Normal work of breathing on room air. Abdomen: Soft, non-tender, non-distended, positive bowel sounds. FDX:Wzhjupcz for extremity edema. Skin:Warm and dry. Neuro:Alert and oriented x3. No focal deficit noted. Psych:Pleasant mood and affect.   Assessment & Plan:   Assessment & Plan Breast  cancer screening by mammogram  Controlled type 2 diabetes mellitus without complication, without long-term current use of insulin  (HCC)   No orders of the defined types were placed in this encounter.    No follow-ups on file.   Patient {GC/GE:[id number]::discussed with,seen with} {JGIMTSattending2025/2026:[id number]}  Fairy Pool, DO [facility] Center Internal Medicine Resident PGY-3 Clinic Phone: [phone] Please contact the on call pager at [phone] for any urgent or emergent needs.

## 2024-05-05 NOTE — Patient Instructions (Incomplete)
 Thank you for coming in today and sharing how you have been doing.  We talked about your chronic pancreatic abdominal pain, which is currently similar to your usual baseline. We also discussed the ongoing difficulty you have had accessing pain management care and how stressful this has been, especially while caring for your husband. Because of these barriers, we agreed to continue managing your pain carefully through our clinic for now.  Please continue taking morphine  ER 30 mg every 12 hours and Creon  with meals and snacks exactly as prescribed. Avoid taking extra doses and do not use medications like ibuprofen  or naproxen unless we discuss them first.  We reviewed your other medications for diabetes, blood pressure, and cholesterol, which you should continue as prescribed. We also discussed checking your hemoglobin A1c to monitor diabetes control.  Please call us  if you have worsening pain, fever, vomiting, trouble keeping food or fluids down, or any side effects from your medications. If appointments, medications, or transportation become difficult, let us  know so we can help.  Follow up Please call after 4 weeks for refill and return in in 3 month, or sooner if symptoms worsen. Monthly visits are needed while pain medication is prescribed.  If you have questions or concerns, call [phone].  Armando Rossetti, MD [facility]   I have ordered the following labs for you:  Lab Orders         Glucose, capillary         ToxASSURE Select 13 (MW), Urine         POC Hbg A1C       Referrals ordered today:   Referral Orders         Amb Referral to Colonoscopy      I have ordered the following medication/changed the following medications:   Stop the following medications: Medications Discontinued During This Encounter  Medication Reason   morphine  (MS CONTIN ) 30 MG 12 hr tablet Reorder     Start the following medications: Meds ordered this encounter   Medications   morphine  (MS CONTIN ) 30 MG 12 hr tablet    Sig: Take 1 tablet (30 mg total) by mouth every 12 (twelve) hours.    Dispense:  60 tablet    Refill:  0     Follow up: 3 month  Remember:   Should you have any questions or concerns please call the internal medicine clinic at [phone].    Armando Rossetti, M.D [facility]

## 2024-05-05 NOTE — Progress Notes (Unsigned)
 "  CC: Pain Contract  HPI:  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 She is currently taking morphine  ER 30 mg twice daily for chronic pancreatitis-related pain and is here today to review her pain medication agreement and monitoring requirements.  During todays visit, we discussed initiation and review of a pain management contract as part of continued opioid prescribing through primary care. The patient reports that she has undergone urine drug screening regularly in the past while receiving care through pain management, most recently in the spring while being followed at [facility], but not since transitioning care to this clinic. She understands the need for urine drug testing as part of the current pain contract and is agreeable to completing testing today.  We also reviewed her preventive care. She reports being largely up to date on vaccinations, including receiving her influenza vaccine, COVID vaccine, and the first dose of the shingles vaccine earlier this year. She acknowledges that she has not yet received the second dose of the shingles vaccine due to her husbands recent severe illness but plans to obtain it at the pharmacy within the next week after being reminded by her pharmacist. She is aware that she is also due for Tdap vaccination and understands this can be completed at the pharmacy.  Additionally, we discussed routine health maintenance. She is aware that she is due for a mammogram and colonoscopy and is agreeable to completing these screenings. Hemoglobin A1c monitoring was reviewed, and plans were made to update routine labs as appropriate.  The patient expressed understanding of the plan and agreed with the outlined next steps.  Past Medical History:  Diagnosis Date   Abdominal pain 02/02/2022   Abdominal pain,  chronic, left upper quadrant 09/21/2016   Overview:  Added automatically from request for surgery [id number]   Ankle injury, right, initial encounter 04/08/2020   IOV 04/08/20   Arthralgia 01/19/2018   Beta thalassemia (HCC)    Blood dyscrasia    thalasemia, sickle cell trait   Childhood asthma    Chronic abdominal pain    Unclear etiology, thought to be due to chronic pancreatitis previously in setting of her pancreatic divisum - however, EUS and EGD performed at [facility]  (10/05/2011) showing normal esophageall, gastric, duodenal mucosa. EUS showing no pancreatic masses, cysts, or changes of chronic pancreatitis. Biliary system nondilated and had no endosonographic abnormalities.   Colon cancer screening 05/23/2016   CVA (cerebral vascular accident) [facility]) 1993   Per report by patient she had a stroke and prolonged rehab course; still weak on left side but not much (03/29/2017)   Encounter for screening mammogram for breast cancer 12/02/2016   Family history of adverse reaction to anesthesia    mom took a long while to wake up; she was allergic to it (03/29/2017)   Grief reaction 02/24/2017   History of blood transfusion 1980s   related to minor sickle cell crisis (03/29/2017)   HLD (hyperlipidemia) 2009   Hypertension    Iron deficiency anemia 11/16/2011   Migraine headache    q couple years now (03/29/2017)   Otitis media 08/24/2021   Palpitation 07/18/2016   Pancreatic divisum    S/P ERCP with stenting 07/2010 at [facility], then stent removal (08/05/2010)   Pneumonia    had it in my teens 3 times (03/29/2017)   Pulmonary embolus (HCC) 08/2004   Two areas of  V/Q mismatch. Findings compatible  with high probability for pulmonary embolus.; pt was on coumadin for 1 year   Right calf pain 05/18/2018   Seizures (HCC) 1993   after the stroke in 1993; none for years now  (03/29/2017)   Sickle cell trait    Thalassemia    Type II diabetes mellitus (HCC) 2010   well  controlled   Uterine cancer (HCC)     Medications Ordered Prior to Encounter[1]  Family History  Problem Relation Age of Onset   Prostate cancer Father    Cancer Father        stomach ca died x 1 year ago   Sickle cell anemia Brother    Lung cancer Maternal Aunt    Lung cancer Maternal Uncle    Breast cancer Maternal Grandmother    Pancreatic cancer Maternal Grandmother    Heart disease Maternal Grandfather    Heart disease Paternal Grandfather    Diabetes Other    Cancer Other        multiple cancers pancreas,colon, breast    Social History   Socioeconomic History   Marital status: Married    Spouse name: Not on file   Number of children: Not on file   Years of education: 12   Highest education level: Not on file  Occupational History    Employer: UNEMPLOYED  Tobacco Use   Smoking status: Former    Current packs/day: 0.00    Average packs/day: 0.5 packs/day for 30.0 years (15.0 ttl pk-yrs)    Types: Cigarettes    Start date: 12/31/1968    Quit date: 01/01/1999    Years since quitting: 25.3   Smokeless tobacco: Never  Vaping Use   Vaping status: Never Used  Substance and Sexual Activity   Alcohol use: No    Alcohol/week: 0.0 standard drinks of alcohol   Drug use: No   Sexual activity: Not Currently    Partners: Male  Other Topics Concern   Not on file  Social History Narrative   Worked at [employer] as a merchandiser, retail, on health visitor about 9 hours/ day   Social Drivers of Health   Tobacco Use: Medium Risk (04/16/2024)   Patient History    Smoking Tobacco Use: Former    Smokeless Tobacco Use: Never    Passive Exposure: Not on Actuary Strain: High Risk (04/13/2023)   Overall Financial Resource Strain (CARDIA)    Difficulty of Paying Living Expenses: Very hard  Food Insecurity: No Food Insecurity (12/12/2023)   Epic    Worried About Radiation Protection Practitioner of Food in the Last Year: Never true    Ran Out of Food in the Last Year: Never true  Transportation  Needs: No Transportation Needs (12/12/2023)   Epic    Lack of Transportation (Medical): No    Lack of Transportation (Non-Medical): No  Physical Activity: Unknown (04/13/2023)   Exercise Vital Sign    Days of Exercise per Week: 7 days    Minutes of Exercise per Session: Not on file  Stress: No Stress Concern Present (04/13/2023)   [name] of Occupational Health - Occupational Stress Questionnaire    Feeling of Stress : Not at all  Social Connections: Socially Integrated (04/13/2023)   Social Connection and Isolation Panel    Frequency of Communication with Friends and Family: More than three times a week    Frequency of Social Gatherings with Friends and Family: More than three times a week    Attends Religious  Services: More than 4 times per year    Active Member of Clubs or Organizations: Yes    Attends Banker Meetings: More than 4 times per year    Marital Status: Married  Catering Manager Violence: Not At Risk (04/13/2023)   Humiliation, Afraid, Rape, and Kick questionnaire    Fear of Current or Ex-Partner: No    Emotionally Abused: No    Physically Abused: No    Sexually Abused: No  Depression (PHQ2-9): Low Risk (04/16/2024)   Depression (PHQ2-9)    PHQ-2 Score: 0  Alcohol Screen: Low Risk (04/13/2023)   Alcohol Screen    Last Alcohol Screening Score (AUDIT): 0  Housing: Low Risk (12/12/2023)   Epic    Unable to Pay for Housing in the Last Year: No    Number of Times Moved in the Last Year: 0    Homeless in the Last Year: No  Utilities: Not At Risk (04/13/2023)   [employer]HC Utilities    Threatened with loss of utilities: No  Health Literacy: Adequate Health Literacy (04/13/2023)   B1300 Health Literacy    Frequency of need for help with medical instructions: Never    Review of Systems: ROS negative except for what is noted on the assessment and plan.  Vitals:   05/06/24 0856  BP: (!) 158/74  Pulse: 74  SpO2: 96%  Weight: 106 lb (48.1 kg)   Height: 5' (1.524 m)    Physical Exam  Physical Exam: Constitutional: Well-appearing woman sitting comfortably in exam chair, in no acute distress. HENT: Normocephalic, atraumatic Eyes: Conjunctiva non-erythematous. Cardiovascular: Regular rate and rhythm; no murmurs, rubs, or gallops. Pulmonary/Chest: Normal work of breathing on room air; lungs clear to auscultation bilaterally. Abdominal: Soft, non-distended; mild chronic LUQ/epigastric tenderness without guarding or rebound. Neurological: Alert and oriented 3;normal gait. Psych: Anxious, cooperative, appropriate affect; expresses frustration related to care coordination but no acute distress.   Assessment & Plan:   Chronic abdominal pain 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 Her pain today is consistent with her usual baseline without new or concerning features. Due to persistent insurance and access barriers to specialty pain management, opioid therapy is being continued through primary care as a temporary bridge. The patient demonstrates understanding of the goals, risks, and monitoring requirements of chronic opioid use and is agreeable to opioid safety measures.  Plan Continue morphine  ER 30 mg PO twice daily without dose escalation Review and sign pain management agreement today Order urine drug screen today as baseline opioid safety monitoring Review PDMP today Reinforce no early refills, single prescriber, and single pharmacy use Schedule  3 month follow-up while opioids are prescribed   Healthcare maintenance [name] is largely engaged in her preventive care but has several outstanding age-appropriate screenings and vaccinations. Some delays have been related to caregiving responsibilities and recent health system barriers. She demonstrates good  understanding of recommended preventive measures and is agreeable to addressing these gaps.  Plan Breast cancer screening: Order mammogram  Colorectal cancer screening: Order colonoscopy Shingles vaccination: Patient received first dose and plans to obtain second dose at pharmacy within the next week Tdap vaccination: Patient due; advised to obtain at pharmacy for convenience and lower cost Diabetes monitoring: Order hemoglobin A1c as routine surveillance Review completion of screenings and vaccinations at next follow-up visit  Controlled type 2 diabetes mellitus without complication, without long-term current use  of insulin  (HCC) Type 2 diabetes mellitus, historically well controlled on metformin , without reported symptoms of hyperglycemia or hypoglycemia.\ Hb A1c: 7.5  Plan Continue metformin  1000 mg BID Order hemoglobin A1c for routine monitoring Reinforce diet adherence Review results at next visit   Patient seen with Dr. Lovie Armando Rossetti M.D [facility] Internal Medicine, PGY-1 Phone: [phone] Date 05/06/2024 Time 9:30 AM          [1]  Current Outpatient Medications on File Prior to Visit  Medication Sig Dispense Refill   acetaminophen  (TYLENOL ) 500 MG tablet Take 2 tablets (1,000 mg total) by mouth every 8 (eight) hours as needed for mild pain or moderate pain. 30 tablet 0   amLODipine  (NORVASC ) 5 MG tablet Take 1 tablet (5 mg total) by mouth daily. 30 tablet 11   fluticasone  (FLONASE ) 50 MCG/ACT nasal spray Place 2 sprays into both nostrils daily for 7 days. 9.9 mL 0   lipase/protease/amylase (CREON ) 36000 UNITS CPEP capsule Take 2 capsules (72,000 Units total) by mouth 3 (three) times daily with meals. May also take 1 capsule (36,000 Units total) as needed (with snacks - up to 4 snacks daily). 300 capsule 11   lisinopril -hydrochlorothiazide  (ZESTORETIC ) 20-12.5 MG tablet Take 1 tablet by mouth daily. 90 tablet 3   metFORMIN  (GLUCOPHAGE ) 1000 MG tablet TAKE 1  TABLET(1000 MG) BY MOUTH TWICE DAILY WITH A MEAL 180 tablet 2   Multiple Vitamin (MULTIVITAMIN PO) Take 1 tablet by mouth daily.     promethazine  (PHENERGAN ) 12.5 MG tablet TAKE 1 TABLET(12.5 MG) BY MOUTH EVERY 8 HOURS AS NEEDED 60 tablet 1   rosuvastatin  (CRESTOR ) 10 MG tablet Take 1 tablet (10 mg total) by mouth daily. 30 tablet 11   No current facility-administered medications on file prior to visit.   "

## 2024-05-06 ENCOUNTER — Ambulatory Visit

## 2024-05-06 VITALS — BP 166/73 | HR 63 | Ht 60.0 in | Wt 106.0 lb

## 2024-05-06 DIAGNOSIS — R109 Unspecified abdominal pain: Secondary | ICD-10-CM | POA: Diagnosis not present

## 2024-05-06 DIAGNOSIS — Z7984 Long term (current) use of oral hypoglycemic drugs: Secondary | ICD-10-CM

## 2024-05-06 DIAGNOSIS — Z0000 Encounter for general adult medical examination without abnormal findings: Secondary | ICD-10-CM

## 2024-05-06 DIAGNOSIS — Z139 Encounter for screening, unspecified: Secondary | ICD-10-CM

## 2024-05-06 DIAGNOSIS — G8929 Other chronic pain: Secondary | ICD-10-CM

## 2024-05-06 DIAGNOSIS — E119 Type 2 diabetes mellitus without complications: Secondary | ICD-10-CM

## 2024-05-06 DIAGNOSIS — Q453 Other congenital malformations of pancreas and pancreatic duct: Secondary | ICD-10-CM

## 2024-05-06 DIAGNOSIS — F1120 Opioid dependence, uncomplicated: Secondary | ICD-10-CM | POA: Diagnosis not present

## 2024-05-06 LAB — POCT GLYCOSYLATED HEMOGLOBIN (HGB A1C): HbA1c, POC (controlled diabetic range): 7.5 % — AB (ref 0.0–7.0)

## 2024-05-06 LAB — GLUCOSE, CAPILLARY: Glucose-Capillary: 178 mg/dL — ABNORMAL HIGH (ref 70–99)

## 2024-05-06 MED ORDER — MORPHINE SULFATE ER 30 MG PO TBCR: 30.0000 mg | EXTENDED_RELEASE_TABLET | Freq: Two times a day (BID) | ORAL | 0 refills | Status: DC

## 2024-05-06 NOTE — Assessment & Plan Note (Signed)
 Type 2 diabetes mellitus, historically well controlled on metformin , without reported symptoms of hyperglycemia or hypoglycemia.\ Hb A1c: 7.5  Plan Continue metformin  1000 mg BID Order hemoglobin A1c for routine monitoring Reinforce diet adherence Review results at next visit

## 2024-05-06 NOTE — Assessment & Plan Note (Signed)
 Ms. Demeter is largely engaged in her preventive care but has several outstanding age-appropriate screenings and vaccinations. Some delays have been related to caregiving responsibilities and recent health system barriers. She demonstrates good understanding of recommended preventive measures and is agreeable to addressing these gaps.  Plan Breast cancer screening: Order mammogram  Colorectal cancer screening: Order colonoscopy Shingles vaccination: Patient received first dose and plans to obtain second dose at pharmacy within the next week Tdap vaccination: Patient due; advised to obtain at pharmacy for convenience and lower cost Diabetes monitoring: Order hemoglobin A1c as routine surveillance Review completion of screenings and vaccinations at next follow-up visit

## 2024-05-06 NOTE — Assessment & Plan Note (Signed)
 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 Her pain today is consistent with her usual baseline without new or concerning features. Due to persistent insurance and access barriers to specialty pain management, opioid therapy is being continued through primary care as a temporary bridge. The patient demonstrates understanding of the goals, risks, and monitoring requirements of chronic opioid use and is agreeable to opioid safety measures.  Plan Continue morphine  ER 30 mg PO twice daily without dose escalation Review and sign pain management agreement today Order urine drug screen today as baseline opioid safety monitoring Review PDMP today Reinforce no early refills, single prescriber, and single pharmacy use Schedule  3 month follow-up while opioids are prescribed

## 2024-05-07 NOTE — Progress Notes (Signed)
Internal Medicine Clinic Attending  I was physically present during the key portions of the resident provided service and participated in the medical decision making of patient's management care. I reviewed pertinent patient test results.  The assessment, diagnosis, and plan were formulated together and I agree with the documentation in the resident's note.  Mercie Eon, MD

## 2024-05-08 LAB — TOXASSURE SELECT,+ANTIDEPR,UR

## 2024-05-20 ENCOUNTER — Other Ambulatory Visit: Payer: Self-pay | Admitting: Student

## 2024-05-20 ENCOUNTER — Telehealth: Payer: Self-pay | Admitting: *Deleted

## 2024-05-20 MED ORDER — MORPHINE SULFATE ER 30 MG PO TBCR: 30.0000 mg | EXTENDED_RELEASE_TABLET | Freq: Two times a day (BID) | ORAL | 0 refills | Status: AC

## 2024-05-20 NOTE — Telephone Encounter (Signed)
 Call to Pharmacy last pick up of the medication was 1218/2025 for a months supply.   Next pick up n prescription is for 05/24/2024.  Need new prescription if the patient can pick up before 05/24/2024.          Copied from CRM [id number]. Topic: Clinical - Prescription Issue >> May 20, 2024  8:10 AM Marda MATSU wrote: Reason for CRM: Pharmacy will not release her medication: morphine  (MS CONTIN ) 30 MG 12 hr tablet.  Please advise.  Last time patient received her meds was on December 18th.

## 2024-05-31 ENCOUNTER — Encounter: Payer: Self-pay | Admitting: Gastroenterology

## 2024-06-05 ENCOUNTER — Ambulatory Visit: Payer: Self-pay

## 2024-07-31 ENCOUNTER — Ambulatory Visit

## 2024-08-05 ENCOUNTER — Ambulatory Visit: Payer: Self-pay
